# Patient Record
Sex: Male | Born: 1990 | Race: Black or African American | Hispanic: No | Marital: Single | Smoking: Current some day smoker
Health system: Southern US, Community
[De-identification: ages and names within clinical notes are randomized; demographics above are authoritative.]

## PROBLEM LIST (undated history)

## (undated) DIAGNOSIS — C7A8 Other malignant neuroendocrine tumors: Secondary | ICD-10-CM

## (undated) DIAGNOSIS — J45909 Unspecified asthma, uncomplicated: Secondary | ICD-10-CM

## (undated) HISTORY — DX: Unspecified asthma, uncomplicated: J45.909

---

## 2012-05-13 DIAGNOSIS — J45909 Unspecified asthma, uncomplicated: Secondary | ICD-10-CM | POA: Insufficient documentation

## 2017-02-17 ENCOUNTER — Ambulatory Visit (HOSPITAL_COMMUNITY)
Admission: RE | Admit: 2017-02-17 | Discharge: 2017-02-17 | Disposition: A | Discharge status: Home / Self Care | Payer: 59 | Attending: Psychiatry | Admitting: Psychiatry

## 2017-02-17 DIAGNOSIS — Z1330 Encounter for screening examination for mental health and behavioral disorders, unspecified: Secondary | ICD-10-CM | POA: Insufficient documentation

## 2017-02-17 DIAGNOSIS — F320 Major depressive disorder, single episode, mild: Secondary | ICD-10-CM
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
## 2017-02-17 NOTE — H&P (Addendum)
Behavioral Health Medical Screening Exam  Leon Fischer. is an 27 y.o. male who presents to [facility] accompanied with his mother, due to worsening depressive symptoms, but denies thoughts of self harm,SI/SA or HI. There is no hx of prior suicide attempts, Hx depression, use of psychotropics, alcoholism or recreational drug use.  Total Time spent with patient: 15 minutes  Psychiatric Specialty Exam: Physical Exam  Constitutional: He is oriented to person, place, and time. He appears well-developed and well-nourished. No distress.  HENT:  Head: Normocephalic.  Eyes: Pupils are equal, round, and reactive to light.  Respiratory: Effort normal and breath sounds normal. No respiratory distress.  Neurological: He is alert and oriented to person, place, and time. No cranial nerve deficit.  Skin: Skin is warm and dry. He is not diaphoretic.  Psychiatric: Judgment and thought content normal. His affect is labile. His speech is delayed. He is withdrawn. Cognition and memory are normal. He exhibits a depressed mood.    Review of Systems  Constitutional: Negative for chills, diaphoresis, fever, malaise/fatigue and weight loss.  Neurological: Negative for weakness.  Psychiatric/Behavioral: Positive for depression. Negative for hallucinations, substance abuse and suicidal ideas.  All other systems reviewed and are negative.   There were no vitals taken for this visit.There is no height or weight on file to calculate BMI.  General Appearance: Casual  Eye Contact:  Minimal  Speech:  Slow  Volume:  Decreased  Mood:  Depressed  Affect:  Congruent  Thought Process:  Goal Directed  Orientation:  Full (Time, Place, and Person)  Thought Content:  Logical  Suicidal Thoughts:  No  Homicidal Thoughts:  No  Memory:  Immediate;   Fair  Judgement:  Fair  Insight:  Fair  Psychomotor Activity:  Negative  Concentration: Concentration: Fair  Recall:  [employer] of Knowledge:Fair  Language: Fair   Akathisia:  Negative  Handed:  Right  AIMS (if indicated):     Assets:  Desire for Improvement  Sleep:       Musculoskeletal: Strength & Muscle Tone: within normal limits Gait & Station: normal Patient leans: N/A  There were no vitals taken for this visit.  Recommendations:  Based on my evaluation the patient does not appear to have an emergency medical condition.  Laverle Hobby, PA-C 02/17/2017, 10:04 PM

## 2017-04-08 ENCOUNTER — Ambulatory Visit (HOSPITAL_COMMUNITY): Payer: Self-pay | Admitting: Psychiatry

## 2017-04-14 ENCOUNTER — Encounter (HOSPITAL_COMMUNITY): Payer: Self-pay | Admitting: Psychiatry

## 2017-04-14 ENCOUNTER — Other Ambulatory Visit (HOSPITAL_COMMUNITY): Payer: Self-pay

## 2017-04-14 ENCOUNTER — Ambulatory Visit (INDEPENDENT_AMBULATORY_CARE_PROVIDER_SITE_OTHER): Payer: 59 | Admitting: Psychiatry

## 2017-04-14 VITALS — BP 132/80 | HR 94 | Ht 72.0 in | Wt 325.0 lb

## 2017-04-14 DIAGNOSIS — Z813 Family history of other psychoactive substance abuse and dependence: Secondary | ICD-10-CM | POA: Diagnosis not present

## 2017-04-14 DIAGNOSIS — Z79899 Other long term (current) drug therapy: Secondary | ICD-10-CM | POA: Diagnosis not present

## 2017-04-14 DIAGNOSIS — F17200 Nicotine dependence, unspecified, uncomplicated: Secondary | ICD-10-CM | POA: Diagnosis not present

## 2017-04-14 DIAGNOSIS — F331 Major depressive disorder, recurrent, moderate: Secondary | ICD-10-CM | POA: Diagnosis not present

## 2017-04-14 MED ORDER — ARIPIPRAZOLE 5 MG PO TABS: ORAL_TABLET | ORAL | 1 refills | Status: DC
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
## 2017-04-15 LAB — CBC WITH DIFFERENTIAL/PLATELET
BASOS: 0 %
Basophils Absolute: 0 10*3/uL (ref 0.0–0.2)
EOS (ABSOLUTE): 0.2 10*3/uL (ref 0.0–0.4)
EOS: 3 %
HEMATOCRIT: 42.5 % (ref 37.5–51.0)
Hemoglobin: 14.2 g/dL (ref 13.0–17.7)
IMMATURE GRANULOCYTES: 0 %
Immature Grans (Abs): 0 10*3/uL (ref 0.0–0.1)
Lymphocytes Absolute: 1.7 10*3/uL (ref 0.7–3.1)
Lymphs: 24 %
MCH: 29.5 pg (ref 26.6–33.0)
MCHC: 33.4 g/dL (ref 31.5–35.7)
MCV: 88 fL (ref 79–97)
MONOS ABS: 0.7 10*3/uL (ref 0.1–0.9)
Monocytes: 9 %
NEUTROS PCT: 64 %
Neutrophils Absolute: 4.8 10*3/uL (ref 1.4–7.0)
Platelets: 325 10*3/uL (ref 150–379)
RBC: 4.81 x10E6/uL (ref 4.14–5.80)
RDW: 13.7 % (ref 12.3–15.4)
WBC: 7.4 10*3/uL (ref 3.4–10.8)

## 2017-04-15 LAB — TSH: TSH: 2.11 u[IU]/mL (ref 0.450–4.500)

## 2017-04-15 LAB — HEMOGLOBIN A1C
Est. average glucose Bld gHb Est-mCnc: 114 mg/dL
Hgb A1c MFr Bld: 5.6 % (ref 4.8–5.6)

## 2017-04-15 LAB — COMPREHENSIVE METABOLIC PANEL
ALBUMIN: 4.6 g/dL (ref 3.5–5.5)
ALT: 16 IU/L (ref 0–44)
AST: 15 IU/L (ref 0–40)
Albumin/Globulin Ratio: 1.6 (ref 1.2–2.2)
Alkaline Phosphatase: 93 IU/L (ref 39–117)
BUN / CREAT RATIO: 12 (ref 9–20)
BUN: 12 mg/dL (ref 6–20)
Bilirubin Total: 0.3 mg/dL (ref 0.0–1.2)
CALCIUM: 9.3 mg/dL (ref 8.7–10.2)
CO2: 22 mmol/L (ref 20–29)
CREATININE: 0.99 mg/dL (ref 0.76–1.27)
Chloride: 104 mmol/L (ref 96–106)
GFR calc Af Amer: 121 mL/min/{1.73_m2} (ref >59)
GFR, EST NON AFRICAN AMERICAN: 105 mL/min/{1.73_m2} (ref >59)
GLOBULIN, TOTAL: 2.9 g/dL (ref 1.5–4.5)
Glucose: 95 mg/dL (ref 65–99)
Potassium: 4.8 mmol/L (ref 3.5–5.2)
SODIUM: 142 mmol/L (ref 134–144)
TOTAL PROTEIN: 7.5 g/dL (ref 6.0–8.5)

## 2017-05-12 ENCOUNTER — Ambulatory Visit (INDEPENDENT_AMBULATORY_CARE_PROVIDER_SITE_OTHER): Payer: 59 | Admitting: Licensed Clinical Social Worker

## 2017-05-12 ENCOUNTER — Encounter (HOSPITAL_COMMUNITY): Payer: Self-pay | Admitting: Licensed Clinical Social Worker

## 2017-05-12 DIAGNOSIS — F331 Major depressive disorder, recurrent, moderate: Secondary | ICD-10-CM | POA: Diagnosis not present
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
## 2017-05-16 ENCOUNTER — Encounter (HOSPITAL_COMMUNITY): Payer: Self-pay | Admitting: Psychiatry

## 2017-05-16 ENCOUNTER — Ambulatory Visit (INDEPENDENT_AMBULATORY_CARE_PROVIDER_SITE_OTHER): Payer: 59 | Admitting: Psychiatry

## 2017-05-16 VITALS — BP 136/80 | HR 87 | Ht 72.0 in | Wt 335.0 lb

## 2017-05-16 DIAGNOSIS — F331 Major depressive disorder, recurrent, moderate: Secondary | ICD-10-CM | POA: Diagnosis not present

## 2017-05-16 DIAGNOSIS — R51 Headache: Secondary | ICD-10-CM

## 2017-05-16 DIAGNOSIS — R110 Nausea: Secondary | ICD-10-CM

## 2017-05-16 DIAGNOSIS — F17210 Nicotine dependence, cigarettes, uncomplicated: Secondary | ICD-10-CM

## 2017-05-16 DIAGNOSIS — Z813 Family history of other psychoactive substance abuse and dependence: Secondary | ICD-10-CM

## 2017-05-16 NOTE — Progress Notes (Signed)
BH MD/PA/NP OP Progress Note  05/16/2017 10:14 AM [name].  MRN:  [id number]  Chief Complaint: I tried Abilify for 5 days but then stopped due to nausea and headaches.  I am not interested to try any new medication.  HPI: Leon Fischer is 27 year old single African-American employed man who was seen first time 4 weeks ago.  He was referred from behavioral health center.  He was seen as a walk-in at behavioral center due to agitation and he was upset on his sister who started dating patient's friend without his knowledge.  He was recommended intensive outpatient program but he never came for IOP.  We started him on Abilify for his anger but patient stopped the Abilify after taking for only 5 days.  He reported he has headaches and nausea and he could not tolerate.  Patient does not want to try any other medication.  He is seeing Emilie Rutter for therapy.  She denies any recent agitation or anger.  He is trying to walk away when he got upset.  He is sleeping better.  He denies any recent rage or any screaming, yelling and shouting.  He is keeping her distance from his daughter.  He does not trust people easily but he does not feel that he need any medication for that.  He denies any hallucination, suicidal thoughts or any aggressive behavior.  He drinks alcohol on and off but denies any binge or any intoxication.  He denies any illegal substance use.  Patient like to follow-up to see Emilie Rutter for counseling.  He denies any homicidal thoughts toward his sister and her boyfriend.  He told that he learned that he need to walk away without causing any problems.  Patient promised that if symptoms started to get worse then he will call for appointment for medication.  Patient never married and he has no children.  Currently he is not in any relationship.  Visit Diagnosis:    ICD-10-CM   1. MDD (major depressive disorder), recurrent episode, moderate ([place]) F33.1     Past Psychiatric History:  Reviewed. Patient had a history of depression and anxiety since age 71.  He denies any history of psychiatric inpatient treatment or any suicidal attempt.  He endorsed having trust issues and he was seen as a walk-in at behavioral health center in January 2019 and recommended IOP but he never came for group therapy.  We started him on Abilify but he stopped taking the medication after 5 days due to nausea and headaches.  Past Medical History:  Past Medical History:  Diagnosis Date  . Asthma    History reviewed. No pertinent surgical history.  Family Psychiatric History: Reviewed  Family History:  Family History  Problem Relation Age of Onset  . Drug abuse Father     Social History:  Social History   Socioeconomic History  . Marital status: Single    Spouse name: Not on file  . Number of children: 0  . Years of education: Not on file  . Highest education level: Some college, no degree  Occupational History  . Not on file  Social Needs  . Financial resource strain: Not hard at all  . Food insecurity:    Worry: Never true    Inability: Never true  . Transportation needs:    Medical: No    Non-medical: No  Tobacco Use  . Smoking status: Current Some Day Smoker  . Smokeless tobacco: Never Used  Substance and Sexual Activity  .  Alcohol use: Yes    Comment: social  . Drug use: No  . Sexual activity: Never  Lifestyle  . Physical activity:    Days per week: 0 days    Minutes per session: 0 min  . Stress: Rather much  Relationships  . Social connections:    Talks on phone: More than three times a week    Gets together: More than three times a week    Attends religious service: Never    Active member of club or organization: No    Attends meetings of clubs or organizations: Never    Relationship status: Never married  Other Topics Concern  . Not on file  Social History Narrative  . Not on file    Allergies:  Allergies  Allergen Reactions  . Shellfish Allergy  Swelling    Eyes, throat swell shut.     Metabolic Disorder Labs: Lab Results  Component Value Date   HGBA1C 5.6 04/14/2017   No results found for: PROLACTIN No results found for: CHOL, TRIG, HDL, CHOLHDL, VLDL, LDLCALC Lab Results  Component Value Date   TSH 2.110 04/14/2017    Therapeutic Level Labs: No results found for: LITHIUM No results found for: VALPROATE No components found for:  CBMZ  Current Medications: Current Outpatient Medications  Medication Sig Dispense Refill  . ARIPiprazole (ABILIFY) 5 MG tablet Take 1/2 tab daily for 1 week and than full tab daily (Patient not taking: Reported on 05/16/2017) 30 tablet 1   No current facility-administered medications for this visit.      Musculoskeletal: Strength & Muscle Tone: within normal limits Gait & Station: normal Patient leans: N/A  Psychiatric Specialty Exam: ROS  Blood pressure 136/80, pulse 87, height 6' (1.829 m), weight (!) 335 lb (152 kg).Body mass index is 45.43 kg/m.  General Appearance: Fairly Groomed  Eye Contact:  Fair  Speech:  Slow  Volume:  Normal  Mood:  Euthymic  Affect:  Congruent  Thought Process:  Goal Directed  Orientation:  Full (Time, Place, and Person)  Thought Content: Logical   Suicidal Thoughts:  No  Homicidal Thoughts:  No  Memory:  Immediate;   Fair Recent;   Fair Remote;   Good  Judgement:  Fair  Insight:  Fair  Psychomotor Activity:  Normal  Concentration:  Concentration: Fair and Attention Span: Fair  Recall:  [employer] of Knowledge: Good  Language: Good  Akathisia:  No  Handed:  Right  AIMS (if indicated): not done  Assets:  Communication Skills Desire for Improvement Housing Transportation  ADL's:  Intact  Cognition: WNL  Sleep:  Good   Screenings: GAD-7     Counselor from 05/12/2017 in [place]  Total GAD-7 Score  7    PHQ2-9     Counselor from 05/12/2017 in [place]   PHQ-2 Total Score  2  PHQ-9 Total Score  7       Assessment and Plan: Mood disorder NOS.  Rule out major depressive disorder, recurrent.  I will discontinue Abilify as patient stopped taking it.  He is not interested to take any medication.  However he agreed to continue therapy with [facility].  He also agreed that if symptoms started to get worse then he will call to schedule appointment and consider taking medication.  We discussed safety concern that any time having active suicidal thoughts or homicidal thought that he need to call 911 or go to local emergency room.  We will not need appointment to see a psychiatrist but he will follow-up with a therapist.     Kathlee Nations, MD 05/16/2017, 10:14 AM

## 2017-05-27 ENCOUNTER — Encounter (HOSPITAL_COMMUNITY): Payer: Self-pay | Admitting: Licensed Clinical Social Worker

## 2017-05-27 ENCOUNTER — Ambulatory Visit (INDEPENDENT_AMBULATORY_CARE_PROVIDER_SITE_OTHER): Payer: 59 | Admitting: Licensed Clinical Social Worker

## 2017-05-27 DIAGNOSIS — F331 Major depressive disorder, recurrent, moderate: Secondary | ICD-10-CM
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
## 2017-06-24 ENCOUNTER — Ambulatory Visit (HOSPITAL_COMMUNITY): Payer: 59 | Admitting: Licensed Clinical Social Worker

## 2017-09-05 ENCOUNTER — Encounter (HOSPITAL_COMMUNITY): Payer: Self-pay

## 2017-09-05 ENCOUNTER — Other Ambulatory Visit: Payer: Self-pay

## 2017-09-05 ENCOUNTER — Emergency Department (HOSPITAL_COMMUNITY)
Admission: EM | Admit: 2017-09-05 | Discharge: 2017-09-05 | Disposition: A | Discharge status: Home / Self Care | Payer: 59 | Attending: Emergency Medicine | Admitting: Emergency Medicine

## 2017-09-05 DIAGNOSIS — F17210 Nicotine dependence, cigarettes, uncomplicated: Secondary | ICD-10-CM | POA: Diagnosis not present

## 2017-09-05 DIAGNOSIS — J4541 Moderate persistent asthma with (acute) exacerbation: Secondary | ICD-10-CM | POA: Insufficient documentation

## 2017-09-05 DIAGNOSIS — R062 Wheezing: Secondary | ICD-10-CM | POA: Diagnosis present

## 2017-09-05 DIAGNOSIS — R0602 Shortness of breath: Secondary | ICD-10-CM | POA: Insufficient documentation

## 2017-09-05 MED ORDER — ALBUTEROL SULFATE (2.5 MG/3ML) 0.083% IN NEBU: 2.5000 mg | INHALATION_SOLUTION | Freq: Four times a day (QID) | RESPIRATORY_TRACT | 12 refills | Status: DC | PRN

## 2017-09-05 MED ORDER — IPRATROPIUM BROMIDE 0.02 % IN SOLN
0.5000 mg | Freq: Once | RESPIRATORY_TRACT | Status: AC
  Administered 2017-09-05: 0.5 mg via RESPIRATORY_TRACT
  Filled 2017-09-05: qty 2.5

## 2017-09-05 MED ORDER — ALBUTEROL SULFATE HFA 108 (90 BASE) MCG/ACT IN AERS: 2.0000 | INHALATION_SPRAY | RESPIRATORY_TRACT | 3 refills | Status: DC | PRN

## 2017-09-05 MED ORDER — ALBUTEROL SULFATE (2.5 MG/3ML) 0.083% IN NEBU
5.0000 mg | INHALATION_SOLUTION | Freq: Once | RESPIRATORY_TRACT | Status: AC
  Administered 2017-09-05: 5 mg via RESPIRATORY_TRACT
  Filled 2017-09-05: qty 6

## 2017-09-05 MED ORDER — PREDNISONE 20 MG PO TABS: 60.0000 mg | ORAL_TABLET | Freq: Every day | ORAL | 0 refills | Status: DC

## 2017-09-05 MED ORDER — ALBUTEROL SULFATE (2.5 MG/3ML) 0.083% IN NEBU
5.0000 mg | INHALATION_SOLUTION | Freq: Once | RESPIRATORY_TRACT | Status: AC
Start: 2017-09-05 — End: 2017-09-05
  Administered 2017-09-05: 5 mg via RESPIRATORY_TRACT
  Filled 2017-09-05: qty 6

## 2017-09-05 NOTE — ED Notes (Signed)
Pt reports relief with neb treatment

## 2017-09-05 NOTE — ED Triage Notes (Signed)
Pt BIBA from home. Ambulatory with asthma attack. Pt has been battling with asthma for 2 weeks- lung suonds were wheezy upon EMS arrival. 10 albuterol and 0.5 atrovent received en route 125 solumedrol. Pt lung sounds are now clear. 148/82 110HR 20RR 97%

## 2017-09-05 NOTE — ED Notes (Signed)
Bed: TK16 Expected date:  Expected time:  Means of arrival:  Comments: 27 yo asthma exacerbation

## 2017-09-05 NOTE — ED Provider Notes (Signed)
[facility] DEPT Provider Note   CSN: [id number] Arrival date & time: 09/05/17  0743     History   Chief Complaint Chief Complaint  Patient presents with  . Asthma    HPI Leon Fischer. is a 27 y.o. male.  Patient w hx asthma, c/o increased wheezing in the past few days. Out of inhaler at home. Symptoms moderate, persistent, felt worse today. Denies increased cough, sore throat, or uri symptoms. No fever or chills. No chest pain or discomfort. Mild sob currently - improved post neb and solumedrol by EMS. Denies leg pain or swelling. +smoker.   The history is provided by the patient and the EMS personnel.  Asthma  Associated symptoms include shortness of breath. Pertinent negatives include no chest pain, no abdominal pain and no headaches.    Past Medical History:  Diagnosis Date  . Asthma     There are no active problems to display for this patient.   History reviewed. No pertinent surgical history.      Home Medications    Prior to Admission medications   Not on File    Family History Family History  Problem Relation Age of Onset  . Drug abuse Father     Social History Social History   Tobacco Use  . Smoking status: Current Some Day Smoker  . Smokeless tobacco: Never Used  Substance Use Topics  . Alcohol use: Yes    Comment: social  . Drug use: No     Allergies   Shellfish allergy   Review of Systems Review of Systems  Constitutional: Negative for fever.  HENT: Negative for sore throat.   Eyes: Negative for redness.  Respiratory: Positive for shortness of breath and wheezing.   Cardiovascular: Negative for chest pain and leg swelling.  Gastrointestinal: Negative for abdominal pain and vomiting.  Genitourinary: Negative for flank pain.  Musculoskeletal: Negative for back pain and neck pain.  Skin: Negative for rash.  Neurological: Negative for headaches.  Hematological: Does not bruise/bleed  easily.  Psychiatric/Behavioral: Negative for confusion.     Physical Exam Updated Vital Signs BP 138/87   Pulse (!) 108   Temp 98.8 F (37.1 C) (Oral)   Resp 18   Ht 1.829 m (6')   Wt (!) 152 kg (335 lb)   SpO2 94%   BMI 45.43 kg/m   Physical Exam  Constitutional: He appears well-developed and well-nourished.  HENT:  Mouth/Throat: Oropharynx is clear and moist.  Eyes: Conjunctivae are normal.  Neck: Neck supple. No tracheal deviation present.  Cardiovascular: Normal rate, regular rhythm, normal heart sounds and intact distal pulses. Exam reveals no gallop and no friction rub.  No murmur heard. Pulmonary/Chest: Effort normal. No accessory muscle usage. No respiratory distress. He has wheezes.  Abdominal: Soft. He exhibits no distension. There is no tenderness.  Musculoskeletal: He exhibits no edema or tenderness.  Neurological: He is alert.  Skin: Skin is warm and dry. No rash noted.  Psychiatric: He has a normal mood and affect.  Nursing note and vitals reviewed.    ED Treatments / Results  Labs (all labs ordered are listed, but only abnormal results are displayed) Labs Reviewed - No data to display  EKG None  Radiology No results found.  Procedures Procedures (including critical care time)  Medications Ordered in ED Medications - No data to display   Initial Impression / Assessment and Plan / ED Course  I have reviewed the triage vital signs and the  nursing notes.  Pertinent labs & imaging results that were available during my care of the patient were reviewed by me and considered in my medical decision making (see chart for details).  EMS gave albuterol and atrovent neb. Solumedrol.   Persistent wheezing. Albuterol and atrovent neb.  Reviewed nursing notes and prior charts for additional history.   Additional neb tx.   Recheck good air exchange. No increased wob.  Pt appears stable for d/c.   Requests rx for neb soln and mdi.     Final  Clinical Impressions(s) / ED Diagnoses   Final diagnoses:  None    ED Discharge Orders    None       Lajean Saver, MD 09/05/17 1103

## 2017-09-05 NOTE — Discharge Instructions (Addendum)
It was our pleasure to provide your ER care today - we hope that you feel better.  Use albuterol treatment every 4 hours as need. Avoid any smoking.  Take prednisone as prescribed.  Return to ER if worse, increased trouble breathing, other concern.

## 2017-11-16 ENCOUNTER — Emergency Department (HOSPITAL_BASED_OUTPATIENT_CLINIC_OR_DEPARTMENT_OTHER)
Admission: EM | Admit: 2017-11-16 | Discharge: 2017-11-16 | Disposition: A | Discharge status: Home / Self Care | Payer: 59 | Attending: Emergency Medicine | Admitting: Emergency Medicine

## 2017-11-16 ENCOUNTER — Other Ambulatory Visit: Payer: Self-pay

## 2017-11-16 DIAGNOSIS — Z79899 Other long term (current) drug therapy: Secondary | ICD-10-CM | POA: Diagnosis not present

## 2017-11-16 DIAGNOSIS — Z720 Tobacco use: Secondary | ICD-10-CM | POA: Insufficient documentation

## 2017-11-16 DIAGNOSIS — J4541 Moderate persistent asthma with (acute) exacerbation: Secondary | ICD-10-CM | POA: Insufficient documentation

## 2017-11-16 MED ORDER — ALBUTEROL SULFATE HFA 108 (90 BASE) MCG/ACT IN AERS
INHALATION_SPRAY | RESPIRATORY_TRACT | Status: AC
  Filled 2017-11-16: qty 6.7

## 2017-11-16 MED ORDER — ALBUTEROL SULFATE (2.5 MG/3ML) 0.083% IN NEBU
5.0000 mg | INHALATION_SOLUTION | Freq: Once | RESPIRATORY_TRACT | Status: AC
  Administered 2017-11-16: 5 mg via RESPIRATORY_TRACT

## 2017-11-16 MED ORDER — ALBUTEROL SULFATE (2.5 MG/3ML) 0.083% IN NEBU: 2.5000 mg | INHALATION_SOLUTION | Freq: Once | RESPIRATORY_TRACT | Status: DC

## 2017-11-16 MED ORDER — ALBUTEROL SULFATE (2.5 MG/3ML) 0.083% IN NEBU
INHALATION_SOLUTION | RESPIRATORY_TRACT | Status: AC
  Filled 2017-11-16: qty 6

## 2017-11-16 MED ORDER — PREDNISONE 20 MG PO TABS: ORAL_TABLET | ORAL | 0 refills | Status: DC

## 2017-11-16 MED ORDER — IPRATROPIUM-ALBUTEROL 0.5-2.5 (3) MG/3ML IN SOLN
3.0000 mL | Freq: Four times a day (QID) | RESPIRATORY_TRACT | Status: DC
  Administered 2017-11-16: 3 mL via RESPIRATORY_TRACT

## 2017-11-16 MED ORDER — ALBUTEROL SULFATE HFA 108 (90 BASE) MCG/ACT IN AERS
2.0000 | INHALATION_SPRAY | RESPIRATORY_TRACT | Status: DC | PRN
  Administered 2017-11-16: 2 via RESPIRATORY_TRACT

## 2017-11-16 MED ORDER — ALBUTEROL SULFATE (2.5 MG/3ML) 0.083% IN NEBU
INHALATION_SOLUTION | RESPIRATORY_TRACT | Status: AC
  Administered 2017-11-16: 22:00:00
  Filled 2017-11-16: qty 3

## 2017-11-16 MED ORDER — METHYLPREDNISOLONE SODIUM SUCC 125 MG IJ SOLR
INTRAMUSCULAR | Status: AC
  Filled 2017-11-16: qty 2

## 2017-11-16 MED ORDER — IPRATROPIUM-ALBUTEROL 0.5-2.5 (3) MG/3ML IN SOLN
RESPIRATORY_TRACT | Status: AC
  Filled 2017-11-16: qty 3

## 2017-11-16 MED ORDER — METHYLPREDNISOLONE SODIUM SUCC 125 MG IJ SOLR
125.0000 mg | Freq: Once | INTRAMUSCULAR | Status: AC
  Administered 2017-11-16: 125 mg via INTRAVENOUS

## 2017-11-16 NOTE — ED Provider Notes (Signed)
[facility] Provider Note   CSN: [id number] Arrival date & time: 11/16/17  2136     History   Chief Complaint Chief Complaint  Patient presents with  . Asthma Exacerbation.    HPI Leon Fischer. is a 27 y.o. male.  HPI Patient complained of wheezing and shortness of breath typical of asthma onset gradually yesterday.  Became much worse this evening.  He treated himself with his albuterol inhaler, with minimal relief.  He has since run out of his inhaler.  Nothing makes symptoms better or worse.  No fever.  He admits to minimal cough.  No other associated symptoms Past Medical History:  Diagnosis Date  . Asthma   No hospitalizations within the past year.  No history of intubation.  There are no active problems to display for this patient.   No past surgical history on file.      Home Medications    Prior to Admission medications   Medication Sig Start Date End Date Taking? Authorizing Provider  albuterol (PROVENTIL HFA;VENTOLIN HFA) 108 (90 Base) MCG/ACT inhaler Inhale 2 puffs into the lungs every 4 (four) hours as needed for wheezing or shortness of breath. 09/05/17   Lajean Saver, MD  albuterol (PROVENTIL) (2.5 MG/3ML) 0.083% nebulizer solution Take 3 mLs (2.5 mg total) by nebulization every 6 (six) hours as needed for wheezing or shortness of breath. 09/05/17   Lajean Saver, MD  predniSONE (DELTASONE) 20 MG tablet 3 tabs po daily x 5 days 11/16/17   Orlie Dakin, MD    Family History Family History  Problem Relation Age of Onset  . Drug abuse Father     Social History Social History   Tobacco Use  . Smoking status: Current Some Day Smoker  . Smokeless tobacco: Never Used  Substance Use Topics  . Alcohol use: Yes    Comment: social  . Drug use: No     Allergies   Shellfish allergy   Review of Systems Review of Systems  Constitutional: Negative.   HENT: Negative.   Respiratory: Positive for cough,  shortness of breath and wheezing.   Cardiovascular: Negative.   Gastrointestinal: Negative.   Musculoskeletal: Negative.   Skin: Negative.   Neurological: Negative.   Psychiatric/Behavioral: Negative.   All other systems reviewed and are negative.    Physical Exam Updated Vital Signs BP (!) 151/86 (BP Location: Right Arm)   Pulse (!) 110   Temp 98.6 F (37 C) (Oral)   Resp (!) 22   SpO2 94%   Physical Exam  Constitutional: He appears well-developed and well-nourished. He appears distressed.  Severely dyspneic  HENT:  Head: Normocephalic and atraumatic.  Eyes: Pupils are equal, round, and reactive to light. Conjunctivae are normal.  Neck: Neck supple. No tracheal deviation present. No thyromegaly present.  Cardiovascular: Regular rhythm and intact distal pulses.  No murmur heard. Mildly tachycardic  Pulmonary/Chest: Breath sounds normal. He is in respiratory distress. He has no wheezes.  Prolonged expiratory phase with expiratory wheezes.  Severe respiratory distress. speaks in 2 or 3 word phrases with use of accessory muscles  Abdominal: Soft. Bowel sounds are normal. He exhibits no distension. There is no tenderness.  Musculoskeletal: Normal range of motion. He exhibits no edema or tenderness.  Neurological: He is alert. Coordination normal.  Skin: Skin is warm. No rash noted.  Diaphoretic  Psychiatric: He has a normal mood and affect.  Nursing note and vitals reviewed.    ED Treatments / Results  Labs (all labs ordered are listed, but only abnormal results are displayed) Labs Reviewed - No data to display  EKG None  Radiology No results found.  Procedures Procedures (including critical care time)  Medications Ordered in ED Medications  methylPREDNISolone sodium succinate (SOLU-MEDROL) 125 mg/2 mL injection (has no administration in time range)  albuterol (PROVENTIL) (2.5 MG/3ML) 0.083% nebulizer solution 2.5 mg (has no administration in time range)    ipratropium-albuterol (DUONEB) 0.5-2.5 (3) MG/3ML nebulizer solution 3 mL (3 mLs Nebulization Given 11/16/17 2140)  albuterol (PROVENTIL) (2.5 MG/3ML) 0.083% nebulizer solution (has no administration in time range)  albuterol (PROVENTIL HFA;VENTOLIN HFA) 108 (90 Base) MCG/ACT inhaler 2 puff (2 puffs Inhalation Given 11/16/17 2333)  albuterol (PROVENTIL HFA;VENTOLIN HFA) 108 (90 Base) MCG/ACT inhaler (has no administration in time range)  albuterol (PROVENTIL) (2.5 MG/3ML) 0.083% nebulizer solution (  Given 11/16/17 2140)  methylPREDNISolone sodium succinate (SOLU-MEDROL) 125 mg/2 mL injection 125 mg (125 mg Intravenous Given 11/16/17 2152)  albuterol (PROVENTIL) (2.5 MG/3ML) 0.083% nebulizer solution 5 mg (5 mg Nebulization Given 11/16/17 2200)     Initial Impression / Assessment and Plan / ED Course  I have reviewed the triage vital signs and the nursing notes.  Pertinent labs & imaging results that were available during my care of the patient were reviewed by me and considered in my medical decision making (see chart for details).     11:25 PM patient's breathing is normal after treatment with 2 albuterol nebulized treatments and IV Solu-Medrol.  He speaks in paragraphs.  Lungs are clear.  Skin is warm and dry.  He is able to ambulate in the emergency department without dyspnea with normal pulse ox.   Plan he will get an albuterol inhaler to go to use 2 puffs every 4 hours as needed for shortness of breath.  Instructed to return if needed more than every 4 hours.  He has a spacer at home.  Prescription prednisone referral primary care. I councilled pt for 5 minutes on smoking cessation,. Blood pressure recheck 3 weeks Final Clinical Impressions(s) / ED Diagnoses  Diagnosis #1 exacerbation of asthma Final diagnoses:  Moderate persistent asthma with exacerbation  #2 elevated blood pressure #3 tobacco abuse CRITICAL CARE Performed by: Orlie Dakin Total critical care time: 30  minutes Critical care time was exclusive of separately billable procedures and treating other patients. Critical care was necessary to treat or prevent imminent or life-threatening deterioration. Critical care was time spent personally by me on the following activities: development of treatment plan with patient and/or surrogate as well as nursing, discussions with consultants, evaluation of patient's response to treatment, examination of patient, obtaining history from patient or surrogate, ordering and performing treatments and interventions, ordering and review of laboratory studies, ordering and review of radiographic studies, pulse oximetry and re-evaluation of patient's condition.  ED Discharge Orders         Ordered    predniSONE (DELTASONE) 20 MG tablet     11/16/17 2334           Orlie Dakin, MD 11/16/17 2342

## 2017-11-16 NOTE — ED Triage Notes (Addendum)
Pt exhibited gross diaphoresis, audible wheezing, short speech, and c/o SOB. Pt reports hx of Asthma. Pt states his rescue inhaler is "out".

## 2017-11-16 NOTE — ED Notes (Signed)
Albuterol MDI use reviewed with patient, patient with good understanding of use, with no questions.  No med administered, patient feels he doesn't need at this time.

## 2017-11-16 NOTE — Discharge Instructions (Addendum)
Use your albuterol inhaler 2 puffs every 4 hours as needed for shortness of breath.  Return if needed more than every 4 hours.  Call the number on these instructions to get a primary care physician.  Ask your primary care physician to help you to stop smoking..  Take the prednisone prescribed starting tomorrow morning and your blood pressure should be rechecked within the next 3 weeks.  Today's was mildly elevated at 151/86

## 2017-11-16 NOTE — ED Notes (Signed)
Patient ambulated on room air approx. 550 ft.  HR [phone], SpO2 95-96% throughout.  No c/o SOB from patient.

## 2018-01-20 NOTE — Addendum Note (Signed)
Encounter addended by: Steffanie Rainwater on: 01/20/2018 7:50 PM  Actions taken: Clinical Note Signed

## 2018-12-20 ENCOUNTER — Encounter (HOSPITAL_BASED_OUTPATIENT_CLINIC_OR_DEPARTMENT_OTHER): Payer: Self-pay | Admitting: Emergency Medicine

## 2018-12-20 ENCOUNTER — Other Ambulatory Visit: Payer: Self-pay

## 2018-12-20 ENCOUNTER — Emergency Department (HOSPITAL_BASED_OUTPATIENT_CLINIC_OR_DEPARTMENT_OTHER)
Admission: EM | Admit: 2018-12-20 | Discharge: 2018-12-20 | Disposition: A | Discharge status: Home / Self Care | Payer: 59 | Attending: Emergency Medicine | Admitting: Emergency Medicine

## 2018-12-20 DIAGNOSIS — F17200 Nicotine dependence, unspecified, uncomplicated: Secondary | ICD-10-CM | POA: Insufficient documentation

## 2018-12-20 DIAGNOSIS — J4521 Mild intermittent asthma with (acute) exacerbation: Secondary | ICD-10-CM | POA: Insufficient documentation

## 2018-12-20 MED ORDER — ALBUTEROL SULFATE HFA 108 (90 BASE) MCG/ACT IN AERS
INHALATION_SPRAY | RESPIRATORY_TRACT | Status: AC
  Filled 2018-12-20: qty 6.7

## 2018-12-20 MED ORDER — ALBUTEROL SULFATE HFA 108 (90 BASE) MCG/ACT IN AERS
8.0000 | INHALATION_SPRAY | Freq: Once | RESPIRATORY_TRACT | Status: AC
  Administered 2018-12-20: 11:00:00 8 via RESPIRATORY_TRACT

## 2018-12-20 MED ORDER — PREDNISONE 10 MG PO TABS: 20.0000 mg | ORAL_TABLET | Freq: Every day | ORAL | 0 refills | Status: AC

## 2018-12-20 MED ORDER — ALBUTEROL SULFATE (2.5 MG/3ML) 0.083% IN NEBU: 2.5000 mg | INHALATION_SOLUTION | Freq: Four times a day (QID) | RESPIRATORY_TRACT | 1 refills | Status: DC | PRN

## 2018-12-20 MED ORDER — METHYLPREDNISOLONE SODIUM SUCC 125 MG IJ SOLR
125.0000 mg | Freq: Once | INTRAMUSCULAR | Status: AC
  Administered 2018-12-20: 125 mg via INTRAVENOUS
  Filled 2018-12-20: qty 2

## 2018-12-20 MED ORDER — ALBUTEROL SULFATE HFA 108 (90 BASE) MCG/ACT IN AERS: 2.0000 | INHALATION_SPRAY | RESPIRATORY_TRACT | 0 refills | Status: DC | PRN

## 2018-12-20 MED ORDER — IPRATROPIUM BROMIDE HFA 17 MCG/ACT IN AERS
2.0000 | INHALATION_SPRAY | Freq: Once | RESPIRATORY_TRACT | Status: AC
  Administered 2018-12-20: 4 via RESPIRATORY_TRACT
  Filled 2018-12-20: qty 12.9

## 2018-12-20 NOTE — ED Notes (Signed)
Pt verbalized understanding of dc instructions.

## 2018-12-20 NOTE — ED Triage Notes (Signed)
Pt here with acute asthma exacerbation since this morning. Ran out of neb solution and rescue inhaler today. O2 sats 87% on RA with audible wheezing heard without auscultation.

## 2018-12-20 NOTE — Discharge Instructions (Signed)
Please read instructions below. Use your albuterol inhaler every 4-6 hours as needed for shortness of breath or wheezing.  Starting tomorrow, begin taking the prednisone, as prescribed, until it is gone. Follow up with your primary care provider. Return to the ER if you have shortness of breath not improved by your inhaler, or new or concerning symptoms.  

## 2018-12-20 NOTE — ED Provider Notes (Signed)
[facility] Provider Note   CSN: [id number] Arrival date & time: 12/20/18  1059     History   Chief Complaint Chief Complaint  Patient presents with  . Asthma    HPI Leon Rizk. is a 28 y.o. male past medical history of asthma, presenting to the emergency department with Hartness of breath and wheezing that began this morning around 10 AM.  Patient states he has been out of his albuterol inhaler and albuterol nebulizer solution for about 1-1/2 months now.  He states his asthma is usually well controlled, not requiring medications very frequently.  He states his symptoms today feel very typical of an asthma exacerbation.  He denies fever or URI symptoms. His SOB feels some improved after albuterol given in triage prior to evaluation.     The history is provided by the patient.    Past Medical History:  Diagnosis Date  . Asthma     There are no active problems to display for this patient.   History reviewed. No pertinent surgical history.      Home Medications    Prior to Admission medications   Medication Sig Start Date End Date Taking? Authorizing Provider  albuterol (PROVENTIL) (2.5 MG/3ML) 0.083% nebulizer solution Take 3 mLs (2.5 mg total) by nebulization every 6 (six) hours as needed for wheezing or shortness of breath. 12/20/18   Robinson, [place] N, PA-C  albuterol (VENTOLIN HFA) 108 (90 Base) MCG/ACT inhaler Inhale 2 puffs into the lungs every 4 (four) hours as needed for wheezing or shortness of breath. 12/20/18   Robinson, [place] N, PA-C  predniSONE (DELTASONE) 10 MG tablet Take 2 tablets (20 mg total) by mouth daily for 5 days. 12/20/18 12/25/18  Robinson, [place] N, PA-C    Family History Family History  Problem Relation Age of Onset  . Drug abuse Father     Social History Social History   Tobacco Use  . Smoking status: Current Some Day Smoker  . Smokeless tobacco: Never Used  Substance Use Topics  .  Alcohol use: Yes    Comment: social  . Drug use: No     Allergies   Shellfish allergy   Review of Systems Review of Systems  All other systems reviewed and are negative.    Physical Exam Updated Vital Signs BP 135/69   Pulse 94   Temp 98.9 F (37.2 C) (Oral)   Resp 18   SpO2 99%   Physical Exam Vitals signs and nursing note reviewed.  Constitutional:      General: He is not in acute distress.    Appearance: He is well-developed. He is obese.  HENT:     Head: Normocephalic and atraumatic.  Eyes:     Conjunctiva/sclera: Conjunctivae normal.  Cardiovascular:     Rate and Rhythm: Normal rate and regular rhythm.  Pulmonary:     Effort: Pulmonary effort is normal.     Breath sounds: Wheezing present.     Comments: Diffuse wheezes with dec air movement. O2 sat 95% on room after. No distress noted. Abdominal:     Palpations: Abdomen is soft.  Skin:    General: Skin is warm.  Neurological:     Mental Status: He is alert.  Psychiatric:        Behavior: Behavior normal.      ED Treatments / Results  Labs (all labs ordered are listed, but only abnormal results are displayed) Labs Reviewed - No data to display  EKG  None  Radiology No results found.  Procedures Procedures (including critical care time)  Medications Ordered in ED Medications  ipratropium (ATROVENT HFA) inhaler 2 puff (4 puffs Inhalation Given 12/20/18 1117)  methylPREDNISolone sodium succinate (SOLU-MEDROL) 125 mg/2 mL injection 125 mg (125 mg Intravenous Given 12/20/18 1120)  albuterol (VENTOLIN HFA) 108 (90 Base) MCG/ACT inhaler 8 puff (8 puffs Inhalation Given 12/20/18 1118)     Initial Impression / Assessment and Plan / ED Course  I have reviewed the triage vital signs and the nursing notes.  Pertinent labs & imaging results that were available during my care of the patient were reviewed by me and considered in my medical decision making (see chart for details).  Clinical Course as  of Dec 19 1312  Sun Dec 20, 2018  1116 Pt evaluated, just administered 8 puffs albuterol by respiratory therapist. Pt has some mild retractions. Lungs with diffuse wheezes. Will administer atrovent and solumedrol   [JR]    Clinical Course User Index [JR] Robinson, [place] N, PA-C       Patient presenting with symptoms consistent with asthma exacerbation. On initial exam, pt with diffuse wheezes and normal respiratory effort. O2 saturation is 95-100% on RA after breathing treatment. Inhaler treatments (given covid precautions) and steroid administered. On re-evaluation lung exam improved and pt reports they are breathing at their baseline. No signs of respiratory distress. Patient will be discharged with prednisone. Pt has been instructed to continue using prescribed medications and to speak with PCP about today's exacerbation. Return precautions discussed. Safe for discharge.   Discussed results, findings, treatment and follow up. Patient advised of return precautions. Patient verbalized understanding and agreed with plan.  Final Clinical Impressions(s) / ED Diagnoses   Final diagnoses:  Mild intermittent asthma with exacerbation    ED Discharge Orders         Ordered    predniSONE (DELTASONE) 10 MG tablet  Daily     12/20/18 1311    albuterol (PROVENTIL) (2.5 MG/3ML) 0.083% nebulizer solution  Every 6 hours PRN     12/20/18 1311    albuterol (VENTOLIN HFA) 108 (90 Base) MCG/ACT inhaler  Every 4 hours PRN     12/20/18 1311           Robinson, [place] N, [place] 12/20/18 1314    Noemi Chapel, MD 12/21/18 [phone]

## 2019-01-13 ENCOUNTER — Other Ambulatory Visit: Payer: Self-pay

## 2019-01-13 DIAGNOSIS — J45909 Unspecified asthma, uncomplicated: Secondary | ICD-10-CM | POA: Diagnosis not present

## 2019-01-13 DIAGNOSIS — Y92410 Unspecified street and highway as the place of occurrence of the external cause: Secondary | ICD-10-CM | POA: Diagnosis not present

## 2019-01-13 DIAGNOSIS — F17200 Nicotine dependence, unspecified, uncomplicated: Secondary | ICD-10-CM | POA: Insufficient documentation

## 2019-01-13 DIAGNOSIS — S0990XA Unspecified injury of head, initial encounter: Secondary | ICD-10-CM | POA: Insufficient documentation

## 2019-01-13 DIAGNOSIS — Y999 Unspecified external cause status: Secondary | ICD-10-CM | POA: Insufficient documentation

## 2019-01-13 DIAGNOSIS — Y9389 Activity, other specified: Secondary | ICD-10-CM | POA: Diagnosis not present

## 2019-01-13 DIAGNOSIS — Z79899 Other long term (current) drug therapy: Secondary | ICD-10-CM | POA: Diagnosis not present

## 2019-01-14 ENCOUNTER — Encounter (HOSPITAL_COMMUNITY): Payer: Self-pay | Admitting: Emergency Medicine

## 2019-01-14 ENCOUNTER — Other Ambulatory Visit: Payer: Self-pay

## 2019-01-14 ENCOUNTER — Emergency Department (HOSPITAL_COMMUNITY): Payer: No Typology Code available for payment source

## 2019-01-14 ENCOUNTER — Emergency Department (HOSPITAL_COMMUNITY)
Admission: EM | Admit: 2019-01-14 | Discharge: 2019-01-14 | Disposition: A | Discharge status: Home / Self Care | Payer: No Typology Code available for payment source | Attending: Emergency Medicine | Admitting: Emergency Medicine

## 2019-01-14 DIAGNOSIS — S0990XA Unspecified injury of head, initial encounter: Secondary | ICD-10-CM
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
## 2019-01-14 MED ORDER — ACETAMINOPHEN 500 MG PO TABS
1000.0000 mg | ORAL_TABLET | Freq: Once | ORAL | Status: AC
  Administered 2019-01-14: 1000 mg via ORAL
  Filled 2019-01-14: qty 2
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
## 2019-01-14 NOTE — ED Notes (Signed)
Patient was verbalized discharge instructions. Pt had no further questions at this time. NAD. 
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
## 2021-07-02 ENCOUNTER — Other Ambulatory Visit: Payer: Self-pay

## 2021-07-02 ENCOUNTER — Inpatient Hospital Stay (HOSPITAL_COMMUNITY): Payer: 59

## 2021-07-02 ENCOUNTER — Inpatient Hospital Stay (HOSPITAL_BASED_OUTPATIENT_CLINIC_OR_DEPARTMENT_OTHER)
Admission: EM | Admit: 2021-07-02 | Discharge: 2021-07-05 | DRG: 375 | Disposition: A | Discharge status: Home / Self Care | Payer: 59 | Attending: Internal Medicine | Admitting: Internal Medicine

## 2021-07-02 ENCOUNTER — Encounter (HOSPITAL_BASED_OUTPATIENT_CLINIC_OR_DEPARTMENT_OTHER): Payer: Self-pay | Admitting: *Deleted

## 2021-07-02 ENCOUNTER — Emergency Department (HOSPITAL_BASED_OUTPATIENT_CLINIC_OR_DEPARTMENT_OTHER): Payer: 59

## 2021-07-02 DIAGNOSIS — Z79899 Other long term (current) drug therapy: Secondary | ICD-10-CM | POA: Diagnosis not present

## 2021-07-02 DIAGNOSIS — F17200 Nicotine dependence, unspecified, uncomplicated: Secondary | ICD-10-CM | POA: Diagnosis present

## 2021-07-02 DIAGNOSIS — K859 Acute pancreatitis without necrosis or infection, unspecified: Secondary | ICD-10-CM

## 2021-07-02 DIAGNOSIS — K5900 Constipation, unspecified: Secondary | ICD-10-CM | POA: Diagnosis present

## 2021-07-02 DIAGNOSIS — K8689 Other specified diseases of pancreas: Secondary | ICD-10-CM | POA: Diagnosis present

## 2021-07-02 DIAGNOSIS — F1090 Alcohol use, unspecified, uncomplicated: Secondary | ICD-10-CM | POA: Diagnosis present

## 2021-07-02 DIAGNOSIS — Z813 Family history of other psychoactive substance abuse and dependence: Secondary | ICD-10-CM | POA: Diagnosis not present

## 2021-07-02 DIAGNOSIS — K769 Liver disease, unspecified: Secondary | ICD-10-CM

## 2021-07-02 DIAGNOSIS — D490 Neoplasm of unspecified behavior of digestive system: Principal | ICD-10-CM | POA: Diagnosis present

## 2021-07-02 DIAGNOSIS — K838 Other specified diseases of biliary tract: Secondary | ICD-10-CM | POA: Diagnosis present

## 2021-07-02 DIAGNOSIS — R590 Localized enlarged lymph nodes: Secondary | ICD-10-CM | POA: Diagnosis present

## 2021-07-02 DIAGNOSIS — C787 Secondary malignant neoplasm of liver and intrahepatic bile duct: Secondary | ICD-10-CM | POA: Diagnosis present

## 2021-07-02 DIAGNOSIS — R109 Unspecified abdominal pain: Secondary | ICD-10-CM | POA: Diagnosis present

## 2021-07-02 DIAGNOSIS — Z91013 Allergy to seafood: Secondary | ICD-10-CM | POA: Diagnosis not present

## 2021-07-02 DIAGNOSIS — R7401 Elevation of levels of liver transaminase levels: Secondary | ICD-10-CM

## 2021-07-02 DIAGNOSIS — R748 Abnormal levels of other serum enzymes: Secondary | ICD-10-CM | POA: Diagnosis present

## 2021-07-02 DIAGNOSIS — Z6841 Body Mass Index (BMI) 40.0 and over, adult: Secondary | ICD-10-CM | POA: Diagnosis not present

## 2021-07-02 DIAGNOSIS — J45909 Unspecified asthma, uncomplicated: Secondary | ICD-10-CM | POA: Diagnosis present

## 2021-07-02 DIAGNOSIS — E876 Hypokalemia: Secondary | ICD-10-CM | POA: Diagnosis present

## 2021-07-02 LAB — COMPREHENSIVE METABOLIC PANEL
ALT: 1293 U/L — ABNORMAL HIGH (ref 0–44)
AST: 577 U/L — ABNORMAL HIGH (ref 15–41)
Albumin: 4.1 g/dL (ref 3.5–5.0)
Alkaline Phosphatase: 252 U/L — ABNORMAL HIGH (ref 38–126)
Anion gap: 10 (ref 5–15)
BUN: 6 mg/dL (ref 6–20)
CO2: 26 mmol/L (ref 22–32)
Calcium: 9.1 mg/dL (ref 8.9–10.3)
Chloride: 102 mmol/L (ref 98–111)
Creatinine, Ser: 0.93 mg/dL (ref 0.61–1.24)
GFR, Estimated: >60 mL/min (ref >60)
Glucose, Bld: 123 mg/dL — ABNORMAL HIGH (ref 70–99)
Potassium: 3.4 mmol/L — ABNORMAL LOW (ref 3.5–5.1)
Sodium: 138 mmol/L (ref 135–145)
Total Bilirubin: 2.2 mg/dL — ABNORMAL HIGH (ref 0.3–1.2)
Total Protein: 7.6 g/dL (ref 6.5–8.1)

## 2021-07-02 LAB — CBC WITH DIFFERENTIAL/PLATELET
Abs Immature Granulocytes: 0.01 10*3/uL (ref 0.00–0.07)
Basophils Absolute: 0 10*3/uL (ref 0.0–0.1)
Basophils Relative: 0 %
Eosinophils Absolute: 0.2 10*3/uL (ref 0.0–0.5)
Eosinophils Relative: 3 %
HCT: 41.7 % (ref 39.0–52.0)
Hemoglobin: 13.9 g/dL (ref 13.0–17.0)
Immature Granulocytes: 0 %
Lymphocytes Relative: 14 %
Lymphs Abs: 0.9 10*3/uL (ref 0.7–4.0)
MCH: 29.6 pg (ref 26.0–34.0)
MCHC: 33.3 g/dL (ref 30.0–36.0)
MCV: 88.7 fL (ref 80.0–100.0)
Monocytes Absolute: 0.6 10*3/uL (ref 0.1–1.0)
Monocytes Relative: 9 %
Neutro Abs: 5 10*3/uL (ref 1.7–7.7)
Neutrophils Relative %: 74 %
Platelets: 320 10*3/uL (ref 150–400)
RBC: 4.7 MIL/uL (ref 4.22–5.81)
RDW: 13.7 % (ref 11.5–15.5)
WBC: 6.8 10*3/uL (ref 4.0–10.5)
nRBC: 0 % (ref 0.0–0.2)

## 2021-07-02 LAB — URINALYSIS, MICROSCOPIC (REFLEX)

## 2021-07-02 LAB — URINALYSIS, ROUTINE W REFLEX MICROSCOPIC
Glucose, UA: NEGATIVE mg/dL
Ketones, ur: NEGATIVE mg/dL
Leukocytes,Ua: NEGATIVE
Nitrite: NEGATIVE
Protein, ur: NEGATIVE mg/dL
Specific Gravity, Urine: 1.015 (ref 1.005–1.030)
pH: 6 (ref 5.0–8.0)

## 2021-07-02 LAB — LIPASE, BLOOD: Lipase: 148 U/L — ABNORMAL HIGH (ref 11–51)

## 2021-07-02 IMAGING — CT CT ABD-PELV W/ CM
2 of 4 series · 15 of 46 positions shown, 17 images · IV contrast (Omnipaque)
Comparison: None Available.

CLINICAL DATA: Abdominal pain radiating to the back for 2 weeks,
worse over the last 2 days. Some nausea vomiting.

EXAM:
CT ABDOMEN AND PELVIS WITH CONTRAST
TECHNIQUE: Multidetector CT imaging of the abdomen and pelvis was performed
using the standard protocol following bolus administration of
intravenous contrast.

[Series 2: axial st · axial · 0.98mm/px · z∈[-536,-36]mm · 12 of 115 slices shown, 14 images]
[im 10/115  soft-tissue]
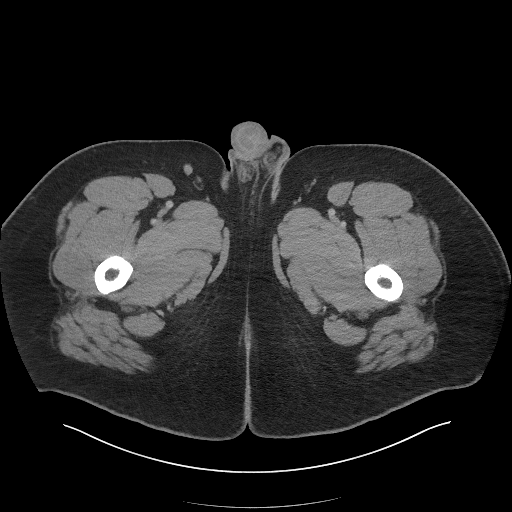
[im 10/115  bone]
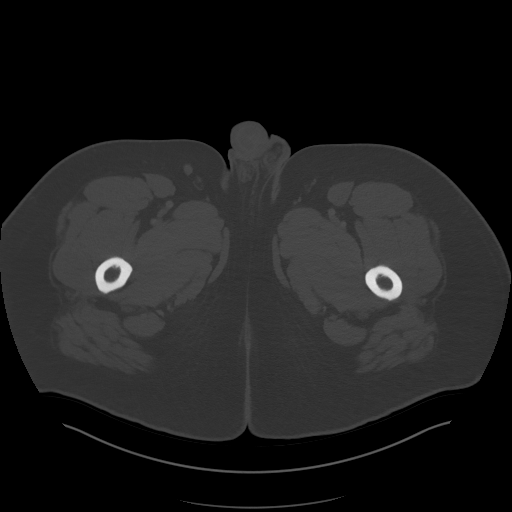
[im 19/115  soft-tissue]
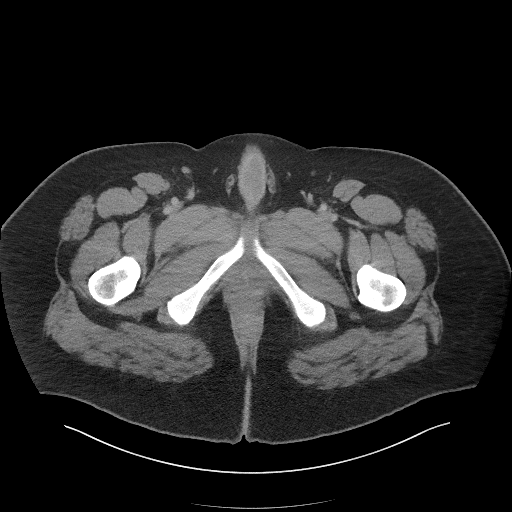
[im 28/115  soft-tissue]
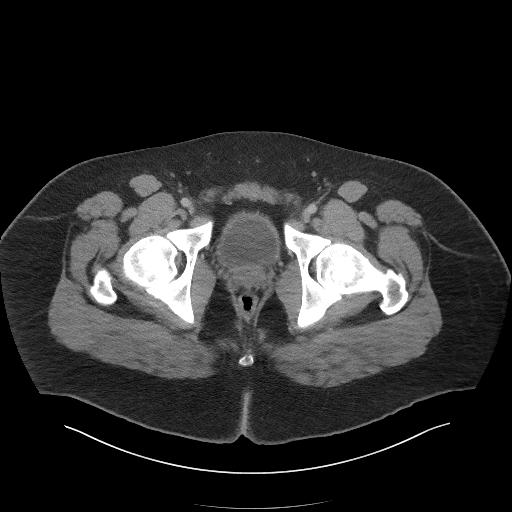
[im 37/115  soft-tissue]
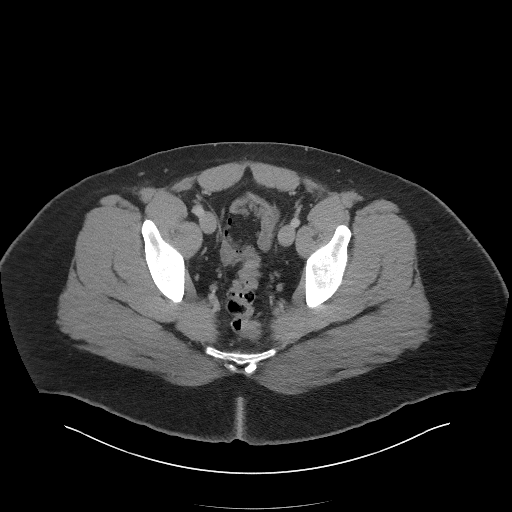
[im 46/115  soft-tissue]
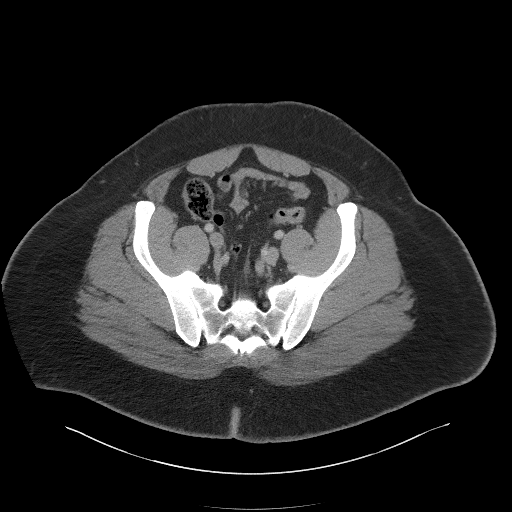
[im 55/115  soft-tissue]
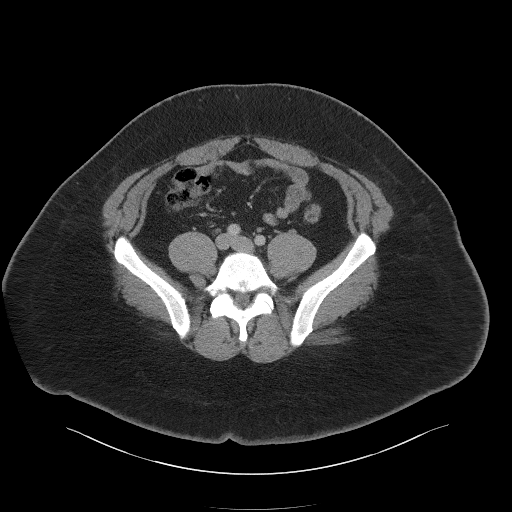
[im 64/115  soft-tissue]
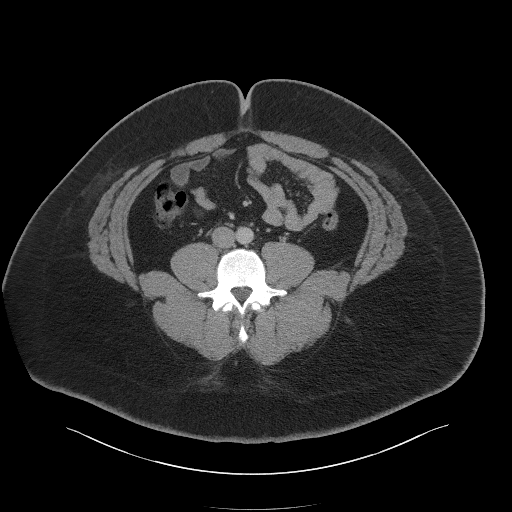
[im 73/115  soft-tissue]
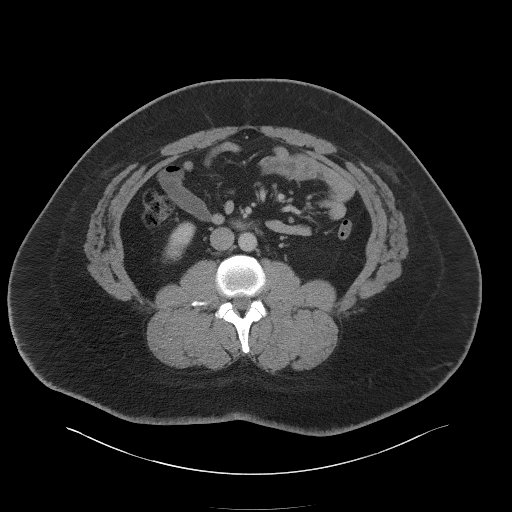
[im 83/115  soft-tissue]
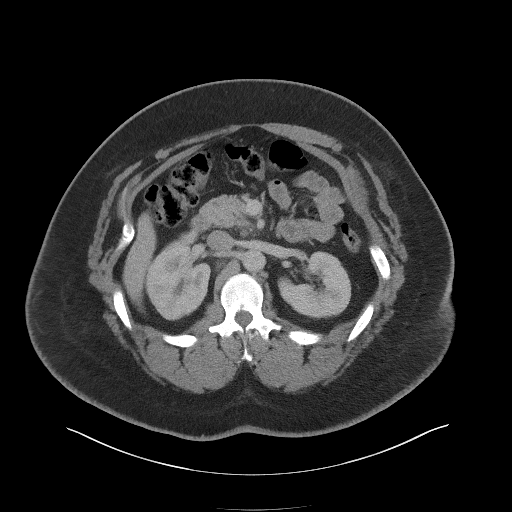
[im 83/115  bone]
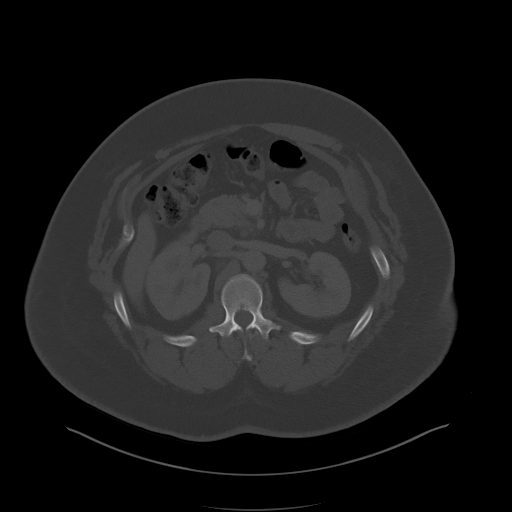
[im 92/115  soft-tissue]
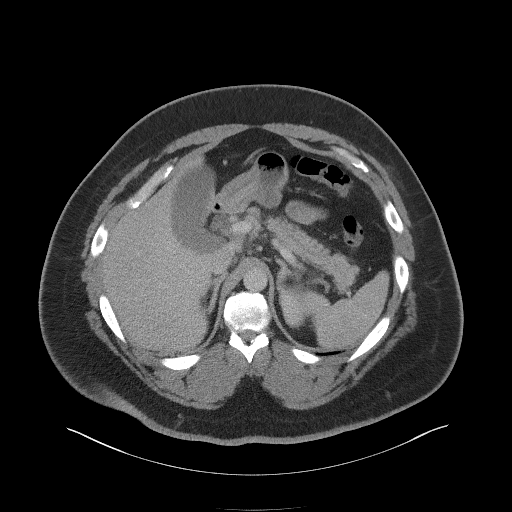
[im 101/115  soft-tissue]
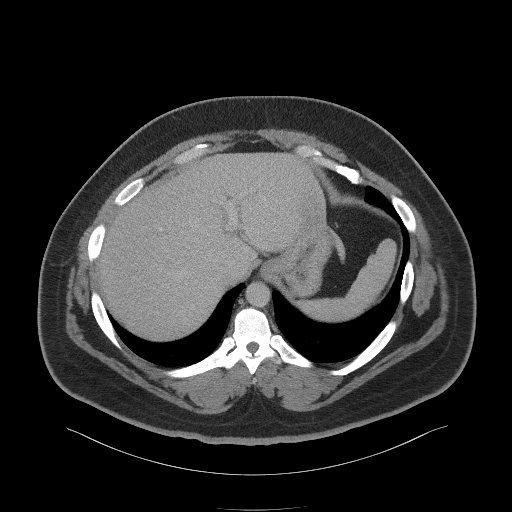
[im 110/115  soft-tissue]
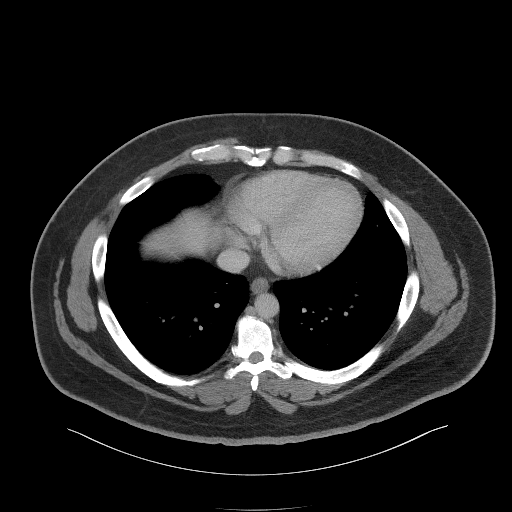

[Series 5: coronal st · coronal · 0.95mm/px · 3 of 121 slices shown]
[im 41/121  soft-tissue]
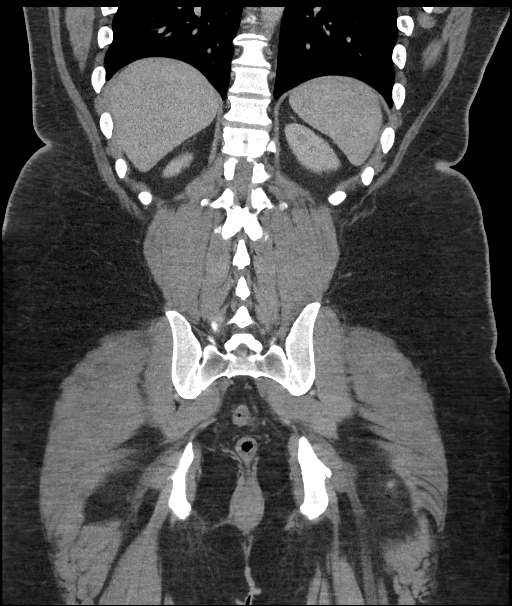
[im 54/121  soft-tissue]
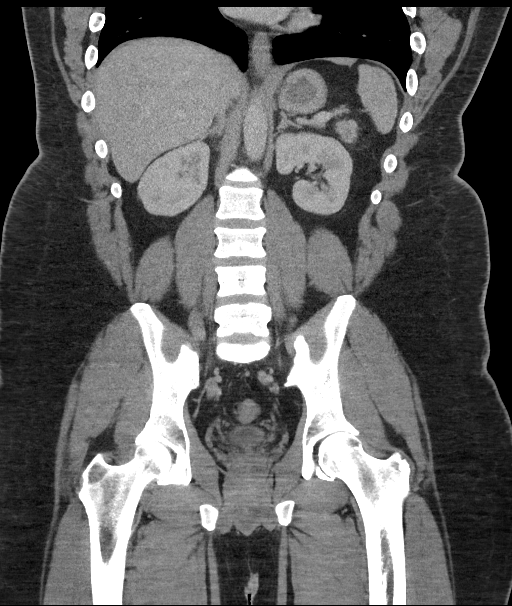
[im 67/121  soft-tissue]
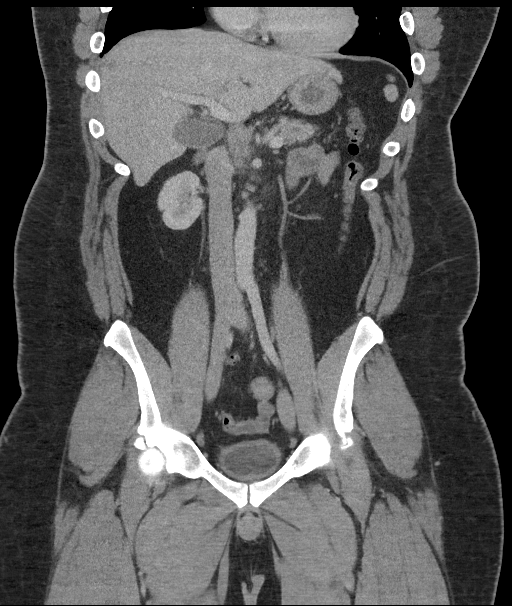

[15 of 46 positions shown; findings below may reference images not displayed]

RADIATION DOSE REDUCTION: This exam was performed according to the
departmental dose-optimization program which includes automated
exposure control, adjustment of the mA and/or kV according to
patient size and/or use of iterative reconstruction technique.

CONTRAST:  100mL OMNIPAQUE IOHEXOL 300 MG/ML  SOLN
FINDINGS: Lower chest: Clear lung bases.

Hepatobiliary: Liver normal in size. There are several subtle
hypoattenuating liver lesions, not well-defined, most apparent at
the lateral dome of segment 7, proximally 1.1 cm in size. No other
liver abnormality.

Gallbladder is distended, but without wall thickening or
pericholecystic fluid. Common bile duct is mildly dilated, 8 mm
proximally.

Pancreas: There is a dystrophic appearing calcification in the upper
pancreatic head surrounded by vague area of relative
hypoattenuation, which may reflect a mass. The possible mass
measures 2.6 cm. No other evidence of a pancreatic mass. The
pancreatic duct distal to the possible mass is dilated to 4 mm. No
pancreatic inflammation.

Spleen: Normal in size without focal abnormality.

Adrenals/Urinary Tract: Adrenal glands are unremarkable. Kidneys are
normal, without renal calculi, focal lesion, or hydronephrosis.
Bladder is unremarkable.

Stomach/Bowel: Stomach is within normal limits. Appendix appears
normal. No evidence of bowel wall thickening, distention, or
inflammatory changes.

Vascular/Lymphatic: Mildly enlarged retroperitoneal lymph nodes at
the level of the upper pole the right kidney, posterior to the
inferior vena cava, 1.5 x 1.4 cm in short axis ease. Mildly
prominent, but subcentimeter peripancreatic and celiac lymph nodes.
No other adenopathy. No vascular abnormality. Portal vein, splenic
vein and superior mesenteric vein are widely patent.

Reproductive: Unremarkable.

Other: No abdominal wall hernia or abnormality. No abdominopelvic
ascites.

Musculoskeletal: No fracture or acute finding.  No bone lesion.
IMPRESSION: 1. Possible 2.6 cm pancreatic mass suspicious for neoplasm,
associated with mild dilation of the pancreatic duct distal to the
apparent mass, mild dilation of the common bile duct and 2 adjacent
enlarged retroperitoneal lymph nodes. There are also subtle
low-attenuation lesions in the liver that are not well-defined,
possibly metastatic disease. Recommend follow-up pancreatic MRI
without and with contrast for further assessment.
2. No other abnormalities.  No acute findings.

## 2021-07-02 IMAGING — MR MR ABDOMEN WO/W CM MRCP
18 of 21 series · 41 of 48 positions shown · IV contrast (gadavist)
Comparison: CT abdomen/pelvis dated [DATE]

CLINICAL DATA: Abdominal pain, nausea/vomiting, possible pancreatic
mass on CT

EXAM:
MRI ABDOMEN WITHOUT AND WITH CONTRAST (INCLUDING MRCP)
TECHNIQUE: Multiplanar multisequence MR imaging of the abdomen was performed
both before and after the administration of intravenous contrast.
Heavily T2-weighted images of the biliary and pancreatic ducts were
obtained, and three-dimensional MRCP images were rendered by post
processing.
CONTRAST:  10mL GADAVIST GADOBUTROL 1 MMOL/ML IV SOLN

[Series 3: T2 fat-sat · axial · 6.0mm · 1.25mm/px · 1 of 40 slices shown]
[im 1/40]
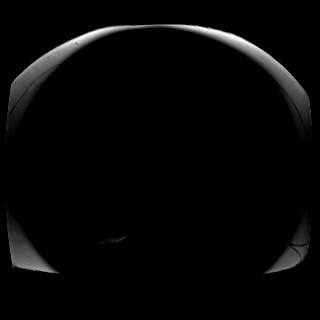

[Series 6: T2 · coronal · 6.0mm · 1.56mm/px · 1 of 35 slices shown (1 of 2)]
[im 1/35]
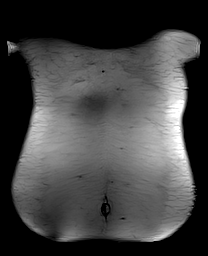

[Series 8: DWI · axial · 6.0mm · 1.49mm/px · z∈[-85,+195]mm · 3 of 80 slices shown (1 of 2)]
[im 1/80]
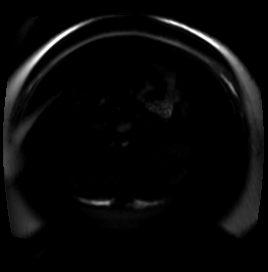
[im 40/80]
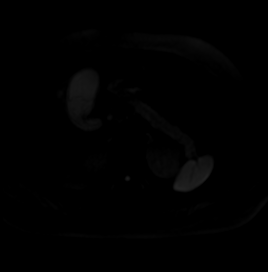
[im 80/80]
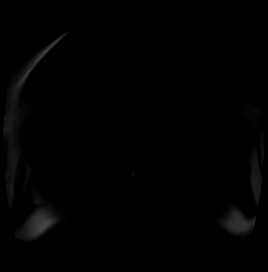

[Series 9: DWI · axial · 6.0mm · 1.49mm/px · 1 of 40 slices shown (2 of 2)]
[im 1/40]
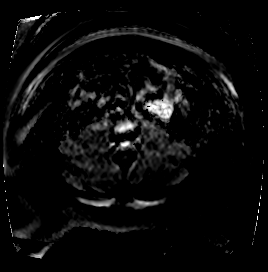

[Series 10: T1 · axial · 3.0mm · 1.25mm/px · z∈[-70,+191]mm · 3 of 88 slices shown (1 of 2)]
[im 1/88]
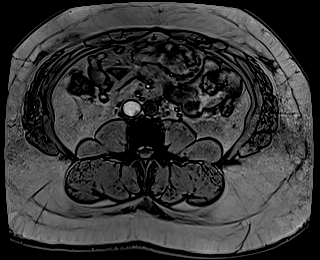
[im 44/88]
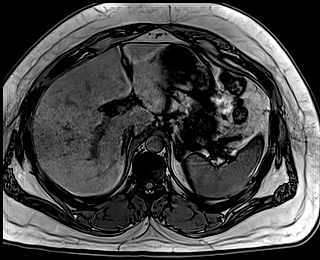
[im 88/88]
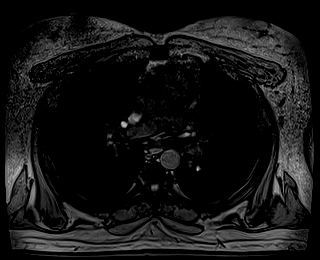

[Series 11: T1 · axial · 3.0mm · 1.25mm/px · z∈[-70,+191]mm · 3 of 88 slices shown (2 of 2)]
[im 1/88]
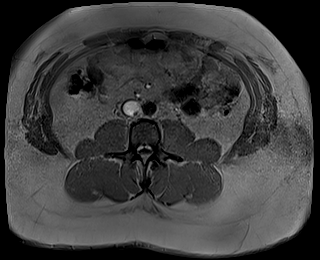
[im 44/88]
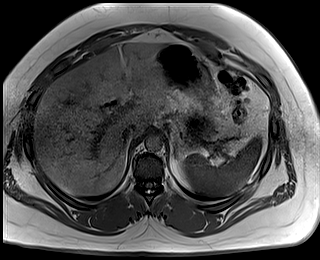
[im 88/88]
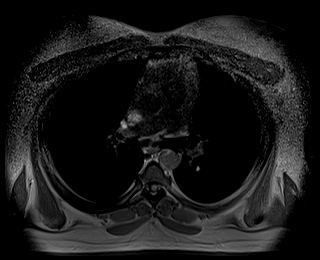

[Series 12: cor obl thk · sagittal · 50.0mm · 0.78mm/px · 1 of 9 slices shown]
[im 1/9]
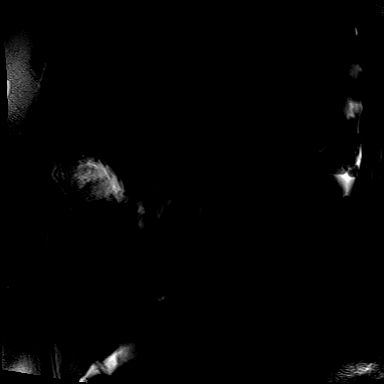

[Series 14: cor_3d_spc_trig · coronal · 1.0mm · 0.49mm/px · 2 of 72 slices shown]
[im 1/72]
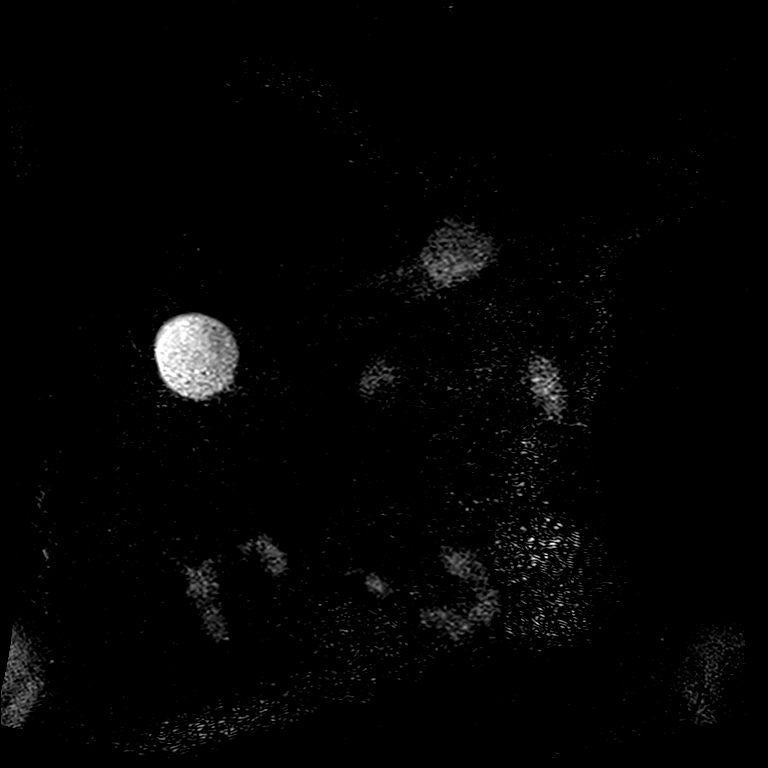
[im 72/72]
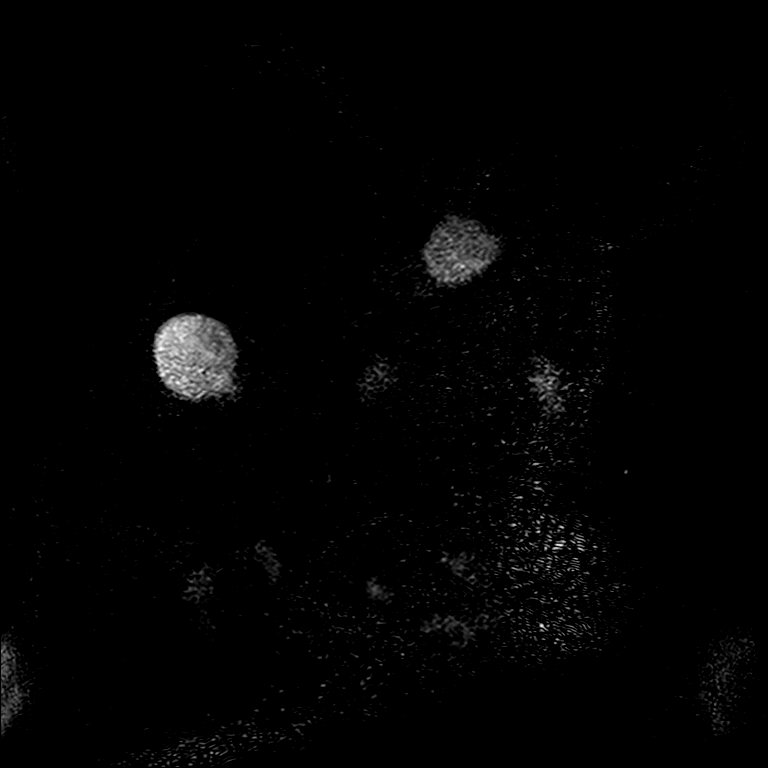

[Series 16: T2 · axial · 6.0mm · 1.56mm/px · 1 of 36 slices shown (2 of 2)]
[im 1/36]
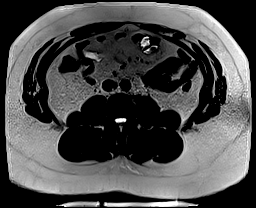

[Series 18: T1 dynamic · axial · 3.0mm · 1.25mm/px · z∈[-87,+198]mm · 3 of 96 slices shown (1 of 9)]
[im 1/96]
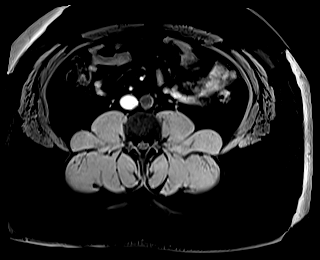
[im 48/96]
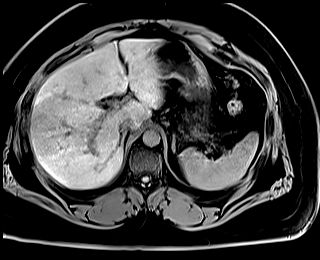
[im 96/96]
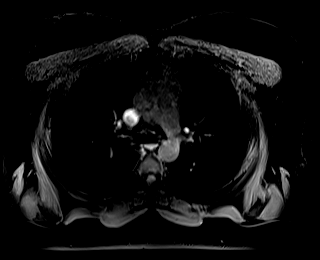

[Series 22: T1 dynamic · axial · 3.0mm · 1.25mm/px · z∈[-87,+198]mm · 3 of 96 slices shown (2 of 9)]
[im 1/96]
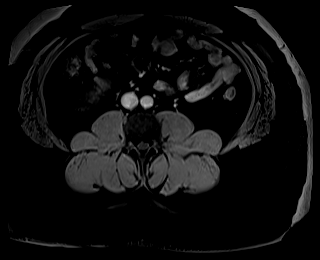
[im 48/96]
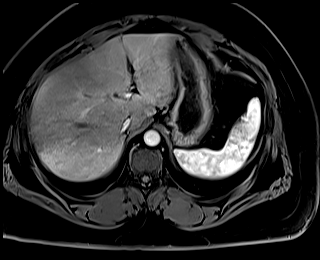
[im 96/96]
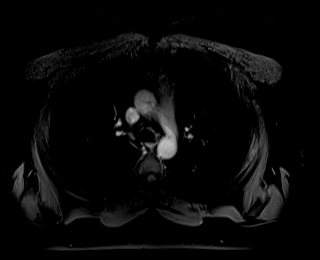

[Series 23: T1 dynamic · axial · 3.0mm · 1.25mm/px · z∈[-87,+198]mm · 3 of 96 slices shown (3 of 9)]
[im 1/96]
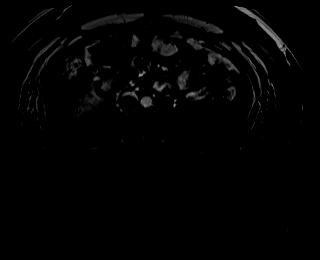
[im 48/96]
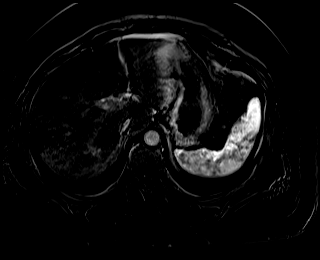
[im 96/96]
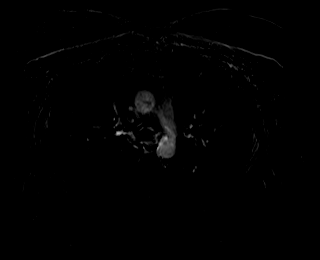

[Series 26: T1 dynamic · axial · 3.0mm · 1.25mm/px · z∈[-87,+198]mm · 3 of 96 slices shown (4 of 9)]
[im 1/96]
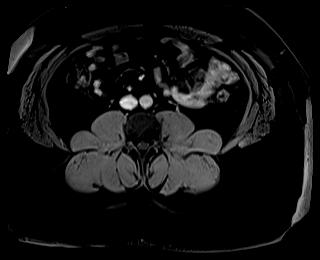
[im 48/96]
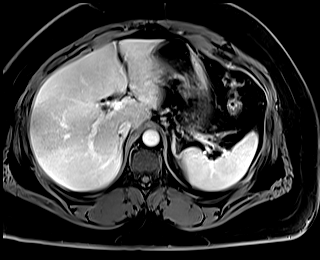
[im 96/96]
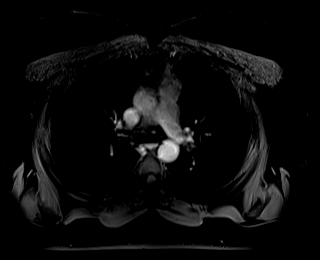

[Series 27: T1 dynamic · axial · 3.0mm · 1.25mm/px · z∈[-87,+198]mm · 3 of 96 slices shown (5 of 9)]
[im 1/96]
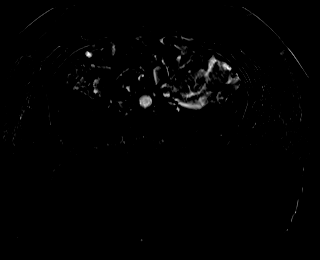
[im 48/96]
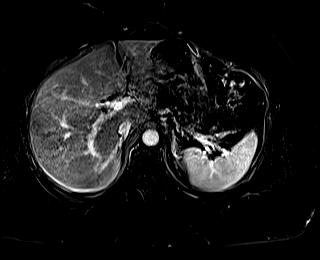
[im 96/96]
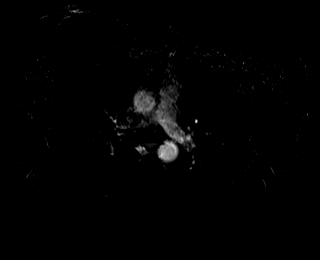

[Series 30: T1 dynamic · axial · 3.0mm · 1.25mm/px · z∈[-87,+198]mm · 3 of 96 slices shown (6 of 9)]
[im 1/96]
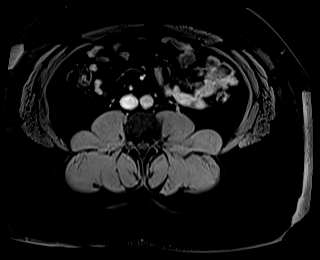
[im 48/96]
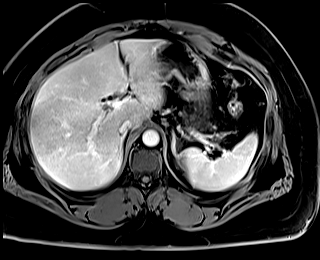
[im 96/96]
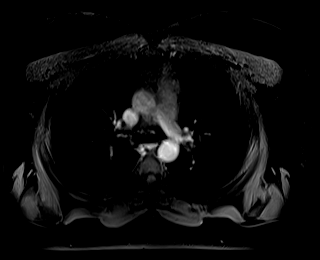

[Series 31: T1 dynamic · axial · 3.0mm · 1.25mm/px · z∈[-87,+198]mm · 3 of 96 slices shown (7 of 9)]
[im 1/96]
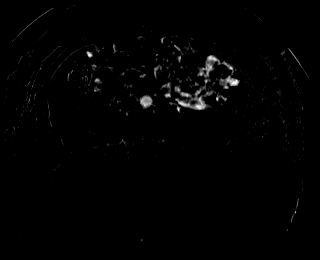
[im 48/96]
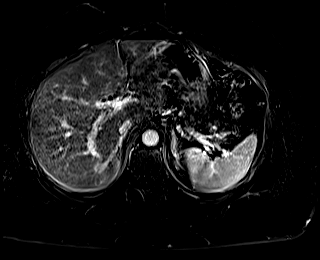
[im 96/96]
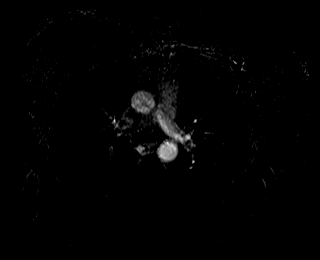

[Series 33: T1 dynamic · coronal · 3.0mm · 1.41mm/px · 2 of 72 slices shown (8 of 9)]
[im 1/72]
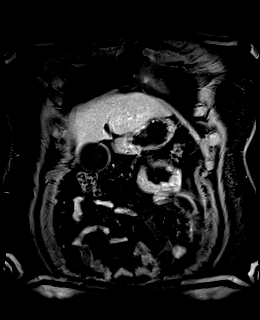
[im 72/72]
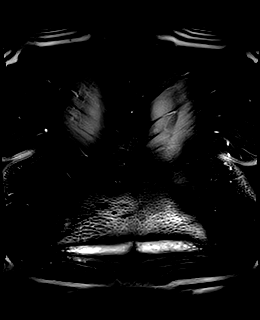

[Series 36: T1 dynamic · axial · 3.0mm · 1.25mm/px · z∈[-87,+54]mm · 2 of 96 slices shown (9 of 9)]
[im 1/96]
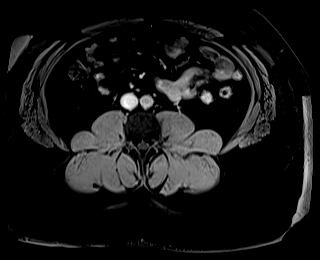
[im 48/96]
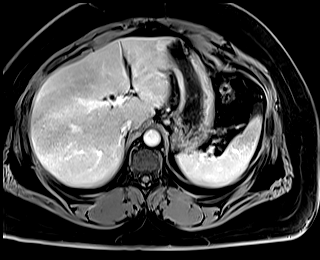

[41 of 48 positions shown; findings below may reference images not displayed]

FINDINGS: Motion degraded images.

Lower chest: Lung bases are clear.

Hepatobiliary: Two subtle liver lesions, measuring 11 mm in segment
8 (series 4/image 9) and 12 mm in segment 5 along the gallbladder
fossa (series 4/image 21). These are poorly evaluated due to motion
degradation, but enhancement is favored at least within the segment
5 lesion on postcontrast subtraction imaging (series 31/image 62).
Associated restricted diffusion. Three additional smaller lesions
are present on diffusion imaging (series 8/images 55 and 63). These
findings are concerning for metastatic disease.

Gallbladder is unremarkable. No cholelithiasis or inflammatory
changes. No intrahepatic or extrahepatic ductal dilatation. No
choledocholithiasis is evident.

Pancreas: 3.1 x 2.3 cm hypoenhancing mass along the posteromedial
aspect of the pancreatic head (series 22/image 67), suspicious for
pancreatic neoplasm. Associated mild dilatation of the main
pancreatic duct (series 4/image 20), which abruptly ends at the
level of the suspected mass (series 4/image 23). No pancreatic
atrophy.

Spleen:  Within normal limits.

Adrenals/Urinary Tract:  Adrenal glands are within normal limits.

Kidneys are within normal limits.  No hydronephrosis.

Stomach/Bowel: Stomach is within normal limits.

Visualized bowel is unremarkable.

Vascular/Lymphatic:  No evidence of abdominal aortic aneurysm.

Celiac artery, SMA, and portal vein appear distinct from the mass,
without definite vascular invasion.

Upper abdominal lymphadenopathy, including a 16 mm short axis
portacaval node (series 4/image 21) and a 17 mm short axis right
retrocaval node at the level of the right renal vein.

Other:  No abdominal ascites.

Musculoskeletal: No focal osseous lesions.
IMPRESSION: 3.1 x 2.3 cm hypoenhancing mass along the posteromedial aspect of
the pancreatic head, suspicious for pancreatic neoplasm. EUS is
suggested.

Multiple small liver lesions, suspicious for hepatic metastases.

Upper abdominal/retroperitoneal lymphadenopathy, suspicious for
nodal metastases.

## 2021-07-02 IMAGING — MR MR 3D RECON AT SCANNER
18 of 20 series · 41 of 48 positions shown · IV contrast (10 GADAVIST)
Comparison: CT abdomen/pelvis dated [DATE]

CLINICAL DATA: Abdominal pain, nausea/vomiting, possible pancreatic
mass on CT

EXAM:
MRI ABDOMEN WITHOUT AND WITH CONTRAST (INCLUDING MRCP)
TECHNIQUE: Multiplanar multisequence MR imaging of the abdomen was performed
both before and after the administration of intravenous contrast.
Heavily T2-weighted images of the biliary and pancreatic ducts were
obtained, and three-dimensional MRCP images were rendered by post
processing.
CONTRAST:  10mL GADAVIST GADOBUTROL 1 MMOL/ML IV SOLN

[Series 4: T2 fat-sat · axial · 6.0mm · 1.25mm/px · z∈[-88,+193]mm · 2 of 40 slices shown]
[im 1/40]
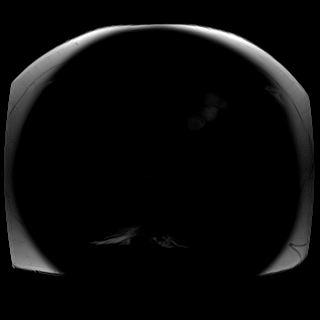
[im 40/40]
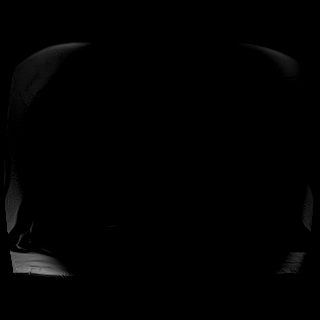

[Series 7: T2 · coronal · 6.0mm · 1.56mm/px · 1 of 35 slices shown (1 of 2)]
[im 1/35]
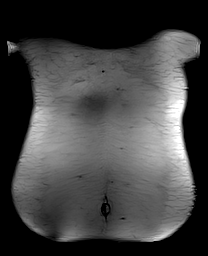

[Series 8: DWI · axial · 6.0mm · 1.49mm/px · z∈[-85,+195]mm · 3 of 80 slices shown (1 of 2)]
[im 1/80]
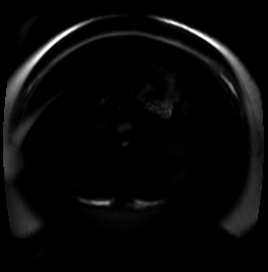
[im 40/80]
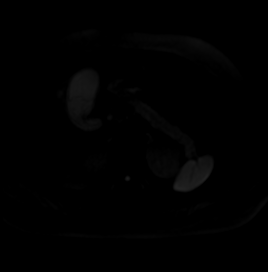
[im 80/80]
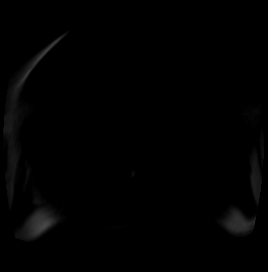

[Series 9: DWI · axial · 6.0mm · 1.49mm/px · 1 of 40 slices shown (2 of 2)]
[im 1/40]
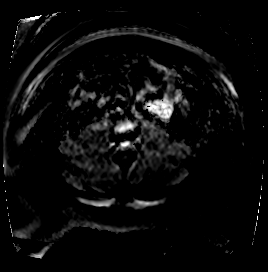

[Series 10: T1 · axial · 3.0mm · 1.25mm/px · z∈[-70,+191]mm · 3 of 88 slices shown (1 of 2)]
[im 1/88]
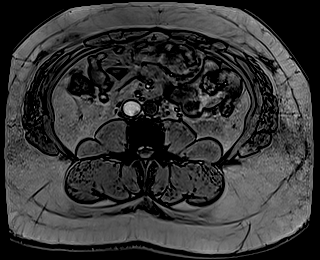
[im 44/88]
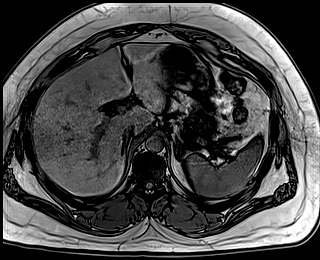
[im 88/88]
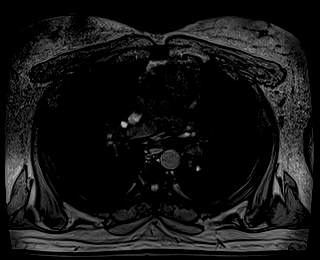

[Series 11: T1 · axial · 3.0mm · 1.25mm/px · z∈[-70,+191]mm · 3 of 88 slices shown (2 of 2)]
[im 1/88]
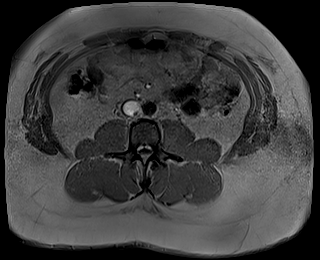
[im 44/88]
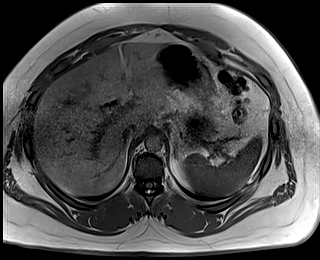
[im 88/88]
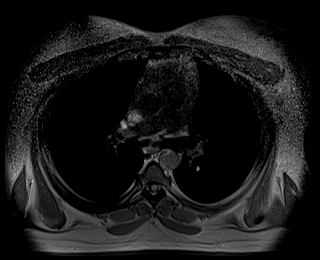

[Series 12: cor obl thk · sagittal · 50.0mm · 0.78mm/px · 1 of 9 slices shown]
[im 1/9]
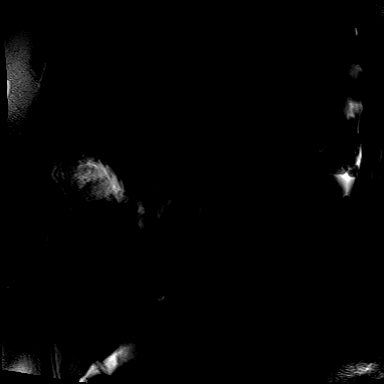

[Series 14: cor_3d_spc_trig · coronal · 1.0mm · 0.49mm/px · 2 of 72 slices shown]
[im 1/72]
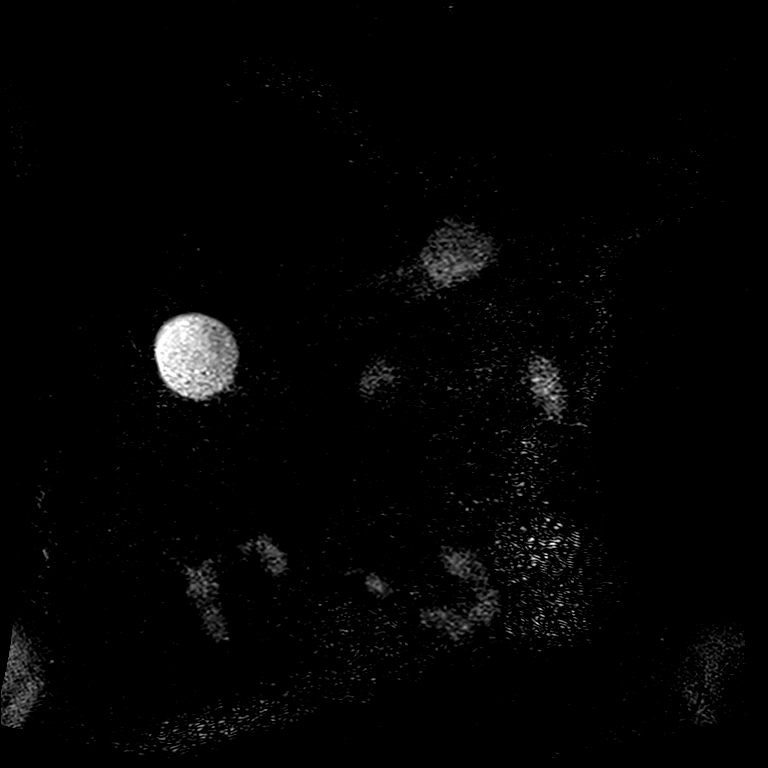
[im 72/72]
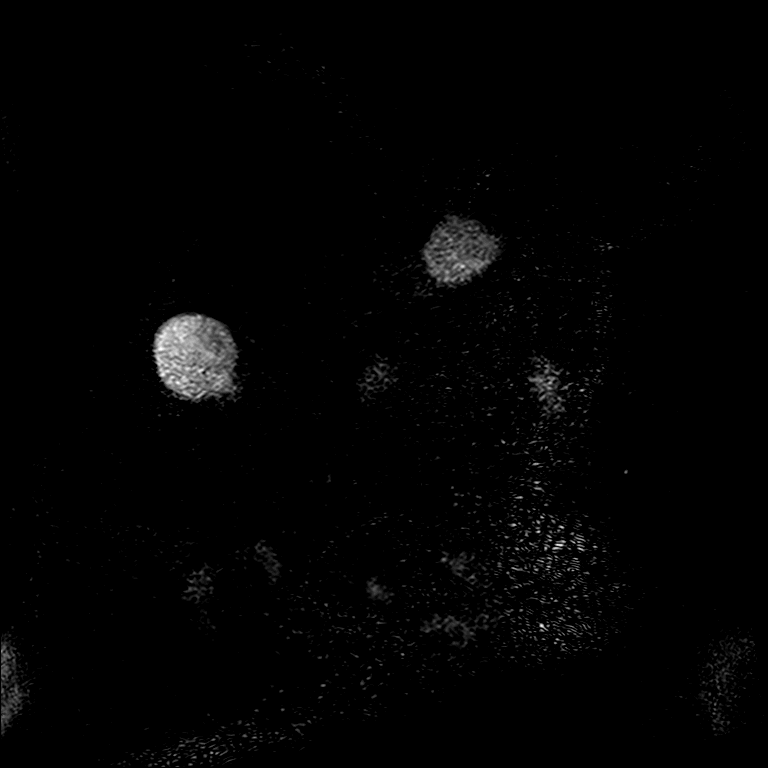

[Series 16: T2 · axial · 6.0mm · 1.56mm/px · 1 of 36 slices shown (2 of 2)]
[im 1/36]
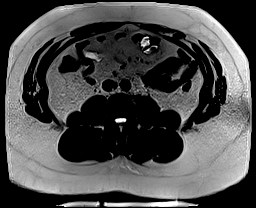

[Series 18: T1 dynamic · axial · 3.0mm · 1.25mm/px · z∈[-87,+198]mm · 3 of 96 slices shown (1 of 9)]
[im 1/96]
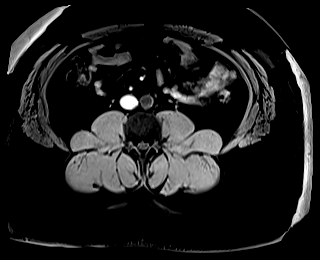
[im 48/96]
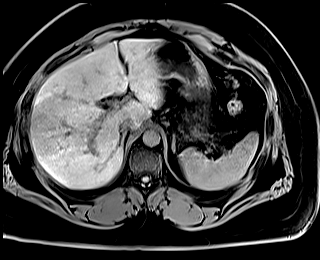
[im 96/96]
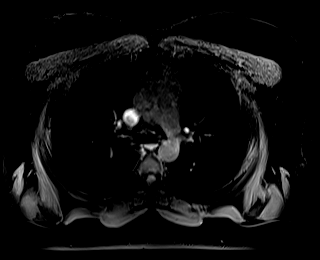

[Series 22: T1 dynamic · axial · 3.0mm · 1.25mm/px · z∈[-87,+198]mm · 3 of 96 slices shown (2 of 9)]
[im 1/96]
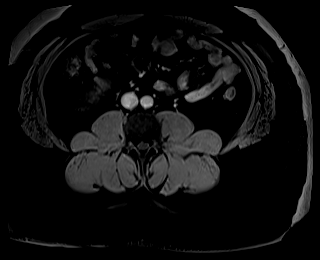
[im 48/96]
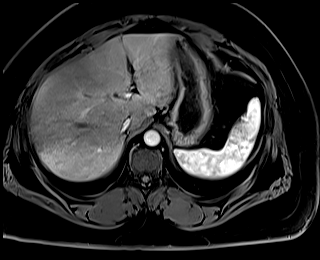
[im 96/96]
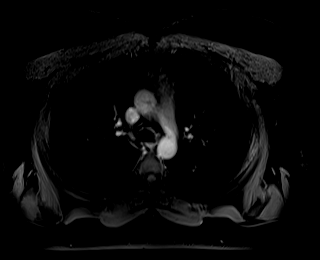

[Series 23: T1 dynamic · axial · 3.0mm · 1.25mm/px · z∈[-87,+198]mm · 3 of 96 slices shown (3 of 9)]
[im 1/96]
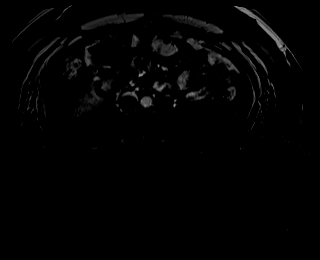
[im 48/96]
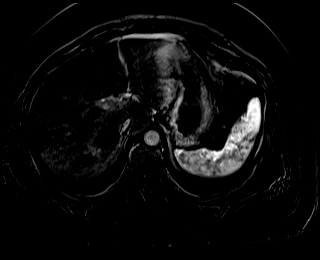
[im 96/96]
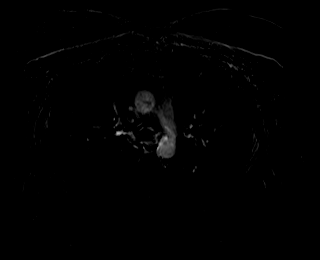

[Series 26: T1 dynamic · axial · 3.0mm · 1.25mm/px · z∈[-87,+198]mm · 3 of 91 slices shown (4 of 9)]
[im 1/91]
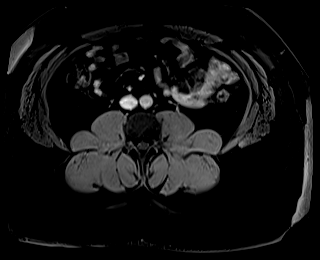
[im 46/91]
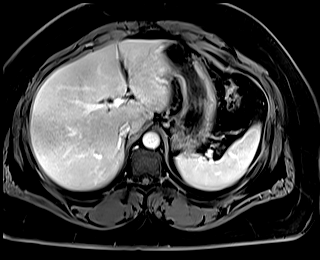
[im 91/91]
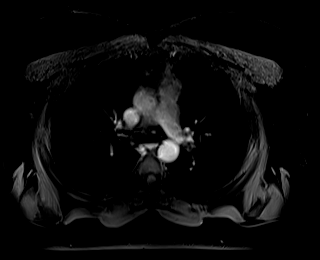

[Series 27: T1 dynamic · axial · 3.0mm · 1.25mm/px · z∈[-87,+198]mm · 3 of 96 slices shown (5 of 9)]
[im 1/96]
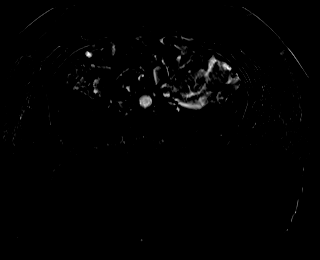
[im 48/96]
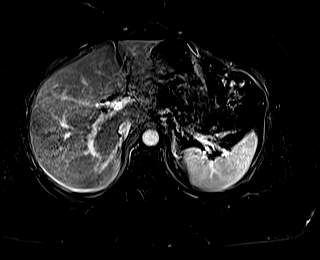
[im 96/96]
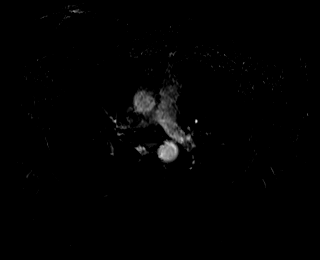

[Series 30: T1 dynamic · axial · 3.0mm · 1.25mm/px · z∈[-87,+198]mm · 3 of 96 slices shown (6 of 9)]
[im 1/96]
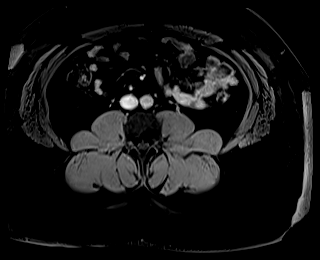
[im 48/96]
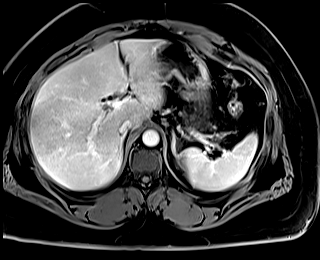
[im 96/96]
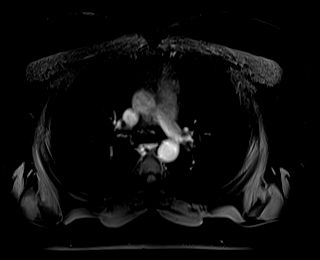

[Series 31: T1 dynamic · axial · 3.0mm · 1.25mm/px · z∈[-87,+198]mm · 3 of 96 slices shown (7 of 9)]
[im 1/96]
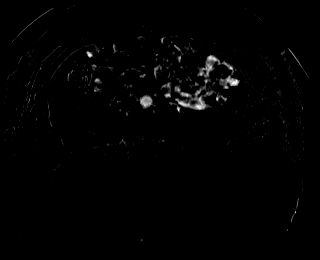
[im 48/96]
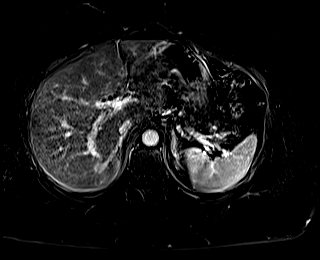
[im 96/96]
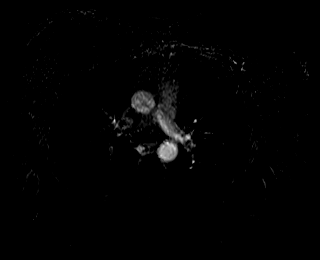

[Series 33: T1 dynamic · coronal · 3.0mm · 1.41mm/px · 2 of 72 slices shown (8 of 9)]
[im 1/72]
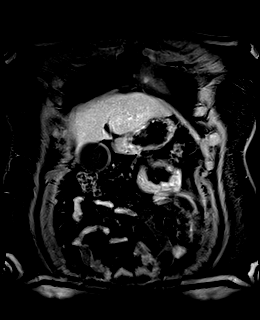
[im 72/72]
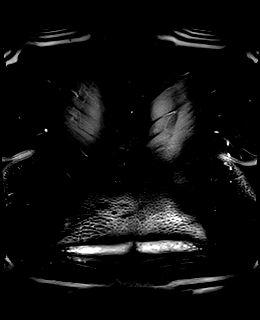

[Series 36: T1 dynamic · axial · 3.0mm · 1.25mm/px · 1 of 96 slices shown (9 of 9)]
[im 1/96]
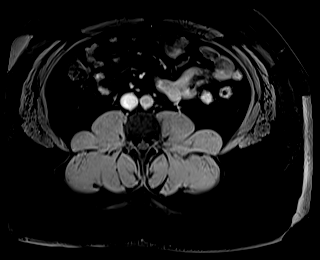

[41 of 48 positions shown; findings below may reference images not displayed]

FINDINGS: Motion degraded images.

Lower chest: Lung bases are clear.

Hepatobiliary: Two subtle liver lesions, measuring 11 mm in segment
8 (series 4/image 9) and 12 mm in segment 5 along the gallbladder
fossa (series 4/image 21). These are poorly evaluated due to motion
degradation, but enhancement is favored at least within the segment
5 lesion on postcontrast subtraction imaging (series 31/image 62).
Associated restricted diffusion. Three additional smaller lesions
are present on diffusion imaging (series 8/images 55 and 63). These
findings are concerning for metastatic disease.

Gallbladder is unremarkable. No cholelithiasis or inflammatory
changes. No intrahepatic or extrahepatic ductal dilatation. No
choledocholithiasis is evident.

Pancreas: 3.1 x 2.3 cm hypoenhancing mass along the posteromedial
aspect of the pancreatic head (series 22/image 67), suspicious for
pancreatic neoplasm. Associated mild dilatation of the main
pancreatic duct (series 4/image 20), which abruptly ends at the
level of the suspected mass (series 4/image 23). No pancreatic
atrophy.

Spleen:  Within normal limits.

Adrenals/Urinary Tract:  Adrenal glands are within normal limits.

Kidneys are within normal limits.  No hydronephrosis.

Stomach/Bowel: Stomach is within normal limits.

Visualized bowel is unremarkable.

Vascular/Lymphatic:  No evidence of abdominal aortic aneurysm.

Celiac artery, SMA, and portal vein appear distinct from the mass,
without definite vascular invasion.

Upper abdominal lymphadenopathy, including a 16 mm short axis
portacaval node (series 4/image 21) and a 17 mm short axis right
retrocaval node at the level of the right renal vein.

Other:  No abdominal ascites.

Musculoskeletal: No focal osseous lesions.
IMPRESSION: 3.1 x 2.3 cm hypoenhancing mass along the posteromedial aspect of
the pancreatic head, suspicious for pancreatic neoplasm. EUS is
suggested.

Multiple small liver lesions, suspicious for hepatic metastases.

Upper abdominal/retroperitoneal lymphadenopathy, suspicious for
nodal metastases.

## 2021-07-02 MED ORDER — GADOBUTROL 1 MMOL/ML IV SOLN
10.0000 mL | Freq: Once | INTRAVENOUS | Status: AC | PRN
  Administered 2021-07-02: 10 mL via INTRAVENOUS

## 2021-07-02 MED ORDER — ALBUTEROL SULFATE (2.5 MG/3ML) 0.083% IN NEBU: 2.5000 mg | INHALATION_SOLUTION | Freq: Four times a day (QID) | RESPIRATORY_TRACT | Status: DC | PRN

## 2021-07-02 MED ORDER — PROCHLORPERAZINE EDISYLATE 10 MG/2ML IJ SOLN
10.0000 mg | Freq: Four times a day (QID) | INTRAMUSCULAR | Status: DC | PRN
Start: 2021-07-02 — End: 2021-07-05

## 2021-07-02 MED ORDER — HYDROMORPHONE HCL 1 MG/ML IJ SOLN: 0.5000 mg | INTRAMUSCULAR | Status: DC | PRN

## 2021-07-02 MED ORDER — OXYCODONE HCL 5 MG PO TABS
5.0000 mg | ORAL_TABLET | ORAL | Status: DC | PRN
  Administered 2021-07-02 – 2021-07-04 (×3): 5 mg via ORAL
  Filled 2021-07-02 (×3): qty 1

## 2021-07-02 MED ORDER — KETOROLAC TROMETHAMINE 15 MG/ML IJ SOLN
15.0000 mg | Freq: Once | INTRAMUSCULAR | Status: AC
  Administered 2021-07-02: 15 mg via INTRAVENOUS
  Filled 2021-07-02: qty 1

## 2021-07-02 MED ORDER — ONDANSETRON HCL 4 MG/2ML IJ SOLN
4.0000 mg | Freq: Once | INTRAMUSCULAR | Status: AC
  Administered 2021-07-02: 4 mg via INTRAVENOUS
  Filled 2021-07-02: qty 2

## 2021-07-02 MED ORDER — ENOXAPARIN SODIUM 40 MG/0.4ML IJ SOSY
40.0000 mg | PREFILLED_SYRINGE | INTRAMUSCULAR | Status: DC
Start: 2021-07-02 — End: 2021-07-05
  Administered 2021-07-02 – 2021-07-04 (×3): 40 mg via SUBCUTANEOUS
  Filled 2021-07-02 (×3): qty 0.4

## 2021-07-02 MED ORDER — SENNOSIDES-DOCUSATE SODIUM 8.6-50 MG PO TABS
2.0000 | ORAL_TABLET | Freq: Two times a day (BID) | ORAL | Status: DC
  Administered 2021-07-02 – 2021-07-05 (×5): 2 via ORAL
  Filled 2021-07-02 (×6): qty 2

## 2021-07-02 MED ORDER — MELATONIN 5 MG PO TABS: 5.0000 mg | ORAL_TABLET | Freq: Every evening | ORAL | Status: DC | PRN

## 2021-07-02 MED ORDER — BOOST / RESOURCE BREEZE PO LIQD CUSTOM
1.0000 | Freq: Three times a day (TID) | ORAL | Status: DC
  Administered 2021-07-02 – 2021-07-05 (×5): 1 via ORAL

## 2021-07-02 MED ORDER — ALBUTEROL SULFATE HFA 108 (90 BASE) MCG/ACT IN AERS: 2.0000 | INHALATION_SPRAY | RESPIRATORY_TRACT | Status: DC | PRN

## 2021-07-02 MED ORDER — POTASSIUM CHLORIDE CRYS ER 20 MEQ PO TBCR
40.0000 meq | EXTENDED_RELEASE_TABLET | Freq: Once | ORAL | Status: AC
  Administered 2021-07-02: 40 meq via ORAL
  Filled 2021-07-02: qty 2

## 2021-07-02 MED ORDER — POTASSIUM CHLORIDE 2 MEQ/ML IV SOLN
INTRAVENOUS | Status: AC
  Filled 2021-07-02 (×3): qty 1000

## 2021-07-02 MED ORDER — IOHEXOL 300 MG/ML  SOLN
100.0000 mL | Freq: Once | INTRAMUSCULAR | Status: AC | PRN
  Administered 2021-07-02: 100 mL via INTRAVENOUS

## 2021-07-02 MED ORDER — POLYETHYLENE GLYCOL 3350 17 G PO PACK: 17.0000 g | PACK | Freq: Every day | ORAL | Status: DC | PRN

## 2021-07-02 NOTE — H&P (Signed)
History and Physical  Leon Fischer. YSA:[id number] DOB: 1990-09-28 DOA: 07/02/2021  Referring physician: Admitted by Dr. Cyndia Skeeters, [facility], hospitalist team. PCP: Patient, No Pcp Per (Inactive)  Outpatient Specialists: None. Patient coming from: Home via [facility] ED transfer.  Chief Complaint: Abdominal pain  HPI: [name]. is a 31 y.o. male with medical history significant for asthma, severe morbid obesity, who initially presented to [facility] ED with complaints of worsening intermittent diffuse abdominal pain, worse at epigastric region and radiating to his back.  Associated with poor oral intake, nausea, vomiting, and constipation, although had a small bowel movement yesterday.  Upon presentation to the ED, he had lab work that returned with elevated liver chemistries, elevated lipase level 148, T bilirubin 2.2.  CT abdomen and pelvis with contrast revealed possible 2.6 cm pancreatic mass suspicious for neoplasm, associated with mild dilatation of the pancreatic duct distal to the apparent mass, mild dilatation of the common bile duct 8 mm and 2 adjacent enlarged retroperitoneal lymph nodes.  Also noted, subtotal low-attenuation lesions in the liver that are not well-defined, possibly metastatic disease.  Recommended follow-up pancreatic MRI without and with contrast for further assessment.    EDP consulted GI Dr. Alessandra Bevels who will see in consultation.  The patient received IV Toradol 50 mg x 1 and IV Zofran 4 mg x 1 in the ED.  EDP requested admission.  Patient was admitted by [facility] hospitalist service, as a direct transfer from [facility] ED to [place] unit.  At the time of this visit the patient is alert and oriented x4.  Reports diffuse abdominal pain, currently 4 out of 10.  Nauseous with last nonbloody nonbilious emesis this morning.  His mother is present at bedside.  Discussed the case with Dr. Alessandra Bevels who will see in consultation in the morning.  The patient was started on IV  fluid, analgesics as needed, and IV antiemetics as needed.  ED Course: Tmax 98.7.  BP 155/95, pulse 64, respiratory rate 18, saturation 100% on room air.  Lab studies remarkable for serum potassium 3.4, serum glucose 123, alkaline phosphatase 252, lipase 148, AST 77, ALT 1293.  T. bili 2.2.  Review of Systems: Review of systems as noted in the HPI. All other systems reviewed and are negative.   Past Medical History:  Diagnosis Date   Asthma    History reviewed. No pertinent surgical history.  Social History:  reports that he has been smoking. He has never used smokeless tobacco. He reports current alcohol use. He reports that he does not use drugs.   Allergies  Allergen Reactions   Shellfish Allergy Swelling and Shortness Of Breath    Eyes, throat swell shut.    Other     Family History  Problem Relation Age of Onset   Drug abuse Father     Maternal uncle deceased, with history of ulcerative colitis.  Prior to Admission medications   Medication Sig Start Date End Date Taking? Authorizing Provider  albuterol (PROVENTIL) (2.5 MG/3ML) 0.083% nebulizer solution Take 3 mLs (2.5 mg total) by nebulization every 6 (six) hours as needed for wheezing or shortness of breath. 12/20/18   Robinson, [place] N, PA-C  albuterol (VENTOLIN HFA) 108 (90 Base) MCG/ACT inhaler Inhale 2 puffs into the lungs every 4 (four) hours as needed for wheezing or shortness of breath. 12/20/18   Robinson, [place] N, PA-C    Physical Exam: BP (!) 155/95 (BP Location: Right Arm)   Pulse 64   Temp 98  F (36.7 C) (Oral)   Resp 18   Ht 6' (1.829 m)   Wt (!) 147.7 kg   SpO2 100%   BMI 44.16 kg/m   General: 31 y.o. year-old male well developed well nourished in no acute distress.  Alert and oriented x3. Cardiovascular: Regular rate and rhythm with no rubs or gallops.  No thyromegaly or JVD noted.  No lower extremity edema. 2/4 pulses in all 4 extremities. Respiratory: Clear to auscultation with no wheezes or  rales. Good inspiratory effort. Abdomen: Soft diffusely tender worse at epigastric region.  Obese with hypoactive bowel sounds. Muskuloskeletal: No cyanosis, clubbing or edema noted bilaterally Neuro: CN II-XII intact, strength, sensation, reflexes Skin: No ulcerative lesions noted or rashes Psychiatry: Judgement and insight appear normal. Mood is appropriate for condition and setting          Labs on Admission:  Basic Metabolic Panel: Recent Labs  Lab 07/02/21 1230  NA 138  K 3.4*  CL 102  CO2 26  GLUCOSE 123*  BUN 6  CREATININE 0.93  CALCIUM 9.1   Liver Function Tests: Recent Labs  Lab 07/02/21 1230  AST 577*  ALT 1,293*  ALKPHOS 252*  BILITOT 2.2*  PROT 7.6  ALBUMIN 4.1   Recent Labs  Lab 07/02/21 1230  LIPASE 148*   No results for input(s): AMMONIA in the last 168 hours. CBC: Recent Labs  Lab 07/02/21 1230  WBC 6.8  NEUTROABS 5.0  HGB 13.9  HCT 41.7  MCV 88.7  PLT 320   Cardiac Enzymes: No results for input(s): CKTOTAL, CKMB, CKMBINDEX, TROPONINI in the last 168 hours.  BNP (last 3 results) No results for input(s): BNP in the last 8760 hours.  ProBNP (last 3 results) No results for input(s): PROBNP in the last 8760 hours.  CBG: No results for input(s): GLUCAP in the last 168 hours.  Radiological Exams on Admission: CT ABDOMEN PELVIS W CONTRAST  Result Date: 07/02/2021 CLINICAL DATA:  Abdominal pain radiating to the back for 2 weeks, worse over the last 2 days. Some nausea vomiting. EXAM: CT ABDOMEN AND PELVIS WITH CONTRAST TECHNIQUE: Multidetector CT imaging of the abdomen and pelvis was performed using the standard protocol following bolus administration of intravenous contrast. RADIATION DOSE REDUCTION: This exam was performed according to the departmental dose-optimization program which includes automated exposure control, adjustment of the mA and/or kV according to patient size and/or use of iterative reconstruction technique. CONTRAST:  11m  OMNIPAQUE IOHEXOL 300 MG/ML  SOLN COMPARISON:  None Available. FINDINGS: Lower chest: Clear lung bases. Hepatobiliary: Liver normal in size. There are several subtle hypoattenuating liver lesions, not well-defined, most apparent at the lateral dome of segment 7, proximally 1.1 cm in size. No other liver abnormality. Gallbladder is distended, but without wall thickening or pericholecystic fluid. Common bile duct is mildly dilated, 8 mm proximally. Pancreas: There is a dystrophic appearing calcification in the upper pancreatic head surrounded by vague area of relative hypoattenuation, which may reflect a mass. The possible mass measures 2.6 cm. No other evidence of a pancreatic mass. The pancreatic duct distal to the possible mass is dilated to 4 mm. No pancreatic inflammation. Spleen: Normal in size without focal abnormality. Adrenals/Urinary Tract: Adrenal glands are unremarkable. Kidneys are normal, without renal calculi, focal lesion, or hydronephrosis. Bladder is unremarkable. Stomach/Bowel: Stomach is within normal limits. Appendix appears normal. No evidence of bowel wall thickening, distention, or inflammatory changes. Vascular/Lymphatic: Mildly enlarged retroperitoneal lymph nodes at the level of the upper pole the  right kidney, posterior to the inferior vena cava, 1.5 x 1.4 cm in short axis ease. Mildly prominent, but subcentimeter peripancreatic and celiac lymph nodes. No other adenopathy. No vascular abnormality. Portal vein, splenic vein and superior mesenteric vein are widely patent. Reproductive: Unremarkable. Other: No abdominal wall hernia or abnormality. No abdominopelvic ascites. Musculoskeletal: No fracture or acute finding.  No bone lesion. IMPRESSION: 1. Possible 2.6 cm pancreatic mass suspicious for neoplasm, associated with mild dilation of the pancreatic duct distal to the apparent mass, mild dilation of the common bile duct and 2 adjacent enlarged retroperitoneal lymph nodes. There are also  subtle low-attenuation lesions in the liver that are not well-defined, possibly metastatic disease. Recommend follow-up pancreatic MRI without and with contrast for further assessment. 2. No other abnormalities.  No acute findings. Electronically Signed   By: Lajean Manes M.D.   On: 07/02/2021 13:50    EKG: I independently viewed the EKG done and my findings are as followed: None extremity visit.  Ordered and pending.  Assessment/Plan Present on Admission:  Pancreatic mass  Principal Problem:   Pancreatic mass  Possible pancreatic mass seen on CT scan CT abdomen and pelvis with contrast revealed possible 2.6 cm pancreatic mass suspicious for neoplasm, associated with mild dilatation of the pancreatic duct distal to the apparent mass, mild dilatation of the common bile duct 8 mm and 2 adjacent enlarged retroperitoneal lymph nodes.  Also subtotal low-attenuation lesions in the liver that are not well-defined, possibly metastatic disease.  Recommended follow-up pancreatic MRI without and with contrast for further assessment.  MRI ordered 07/02/2021, results are pending. Analgesics as needed IV fluid LR KCl 40 mEq at 50 cc/h x 2-day. IV antiemetics as needed Clear liquid diet as tolerated N.p.o. after midnight GI will see in consultation on 07/03/2021.  Common bile duct dilatation with concern for choledocholithiasis or obstructive mass MRCP with and without contrast ordered, follow results No gallstone reported on CT scan radiology report CBD 8 mm N.p.o. after midnight for possible EUS or ERCP Rest of management per GI  Elevated liver chemistries CT scan findings as stated above. Repeat chemistry panel in the morning Rest of management per GI.  Hypokalemia Serum potassium 3.4 Repleted orally and intravenously P.o. KCl 40 mEq x 1 dose. LR KCl 40 mEq at 50 cc/h x 2 days.  Elevated lipase Lipase level 148 Repeat level in the morning  Severe morbid obesity BMI 44 Recommend weight  loss outpatient with regular physical activity and healthy dieting.  Constipation Last bowel movement was yesterday Bowel regimen in place.  Occasional alcohol use No evidence of alcohol withdrawal at time of the visit. States he drinks alcohol maybe once every 2 months   Critical care time: 65 minutes.   DVT prophylaxis: Subcu Lovenox daily  Code Status: Full code  Family Communication: Mother at bedside  Disposition Plan: Likely will discharge to home once GI sounds  Consults called: GI  Admission status: Inpatient status.   Status is: Inpatient Patient requires at least 2 midnights for further evaluation and treatment of present condition.   Kayleen Memos MD [facility] Pager [phone]  If 7PM-7AM, please contact night-coverage [email or web address] Password [facility]  07/02/2021, 5:21 PM

## 2021-07-02 NOTE — ED Provider Notes (Signed)
Leon Fischer Provider Note   CSN: [id number] Arrival date & time: 07/02/21  1155     History  Chief Complaint  Patient presents with   Abdominal Pain    Leon Fischer. is a 31 y.o. male.  31 year old male presents today for evaluation of abdominal pain that radiates into his back with associated vomiting, and constipation.  He states present for 2 weeks however significantly worse over the past 2 days.  Endorses using intermittent ibuprofen about twice a week, social alcohol drinker.  Denies history of pancreatitis.  Denies prior abdominal surgeries.  He states typically he has about 5 bowel movements per day but over the past week or so he has been constipated and has had missed a couple small bowel movements in this timeframe.  He has tried couple over-the-counter stool softeners without relief.  The history is provided by the patient. No language interpreter was used.      Home Medications Prior to Admission medications   Medication Sig Start Date End Date Taking? Authorizing Provider  albuterol (PROVENTIL) (2.5 MG/3ML) 0.083% nebulizer solution Take 3 mLs (2.5 mg total) by nebulization every 6 (six) hours as needed for wheezing or shortness of breath. 12/20/18   Robinson, [place] N, PA-C  albuterol (VENTOLIN HFA) 108 (90 Base) MCG/ACT inhaler Inhale 2 puffs into the lungs every 4 (four) hours as needed for wheezing or shortness of breath. 12/20/18   Robinson, [place] N, PA-C      Allergies    Shellfish allergy and Other    Review of Systems   Review of Systems  Constitutional:  Negative for chills and fever.  Respiratory:  Negative for shortness of breath.   Gastrointestinal:  Positive for abdominal pain, constipation and vomiting. Negative for blood in stool.  Genitourinary:  Negative for dysuria.  Neurological:  Negative for light-headedness.  All other systems reviewed and are negative.  Physical Exam Updated Vital Signs BP (!)  148/104 (BP Location: Right Arm)   Pulse 76   Temp 98.7 F (37.1 C) (Oral)   Resp 18   Ht 6' (1.829 m)   Wt (!) 156.5 kg   SpO2 100%   BMI 46.79 kg/m  Physical Exam Vitals and nursing note reviewed.  Constitutional:      General: He is not in acute distress.    Appearance: Normal appearance. He is not ill-appearing.  HENT:     Head: Normocephalic and atraumatic.     Nose: Nose normal.  Eyes:     General: No scleral icterus.    Extraocular Movements: Extraocular movements intact.     Conjunctiva/sclera: Conjunctivae normal.  Cardiovascular:     Rate and Rhythm: Normal rate and regular rhythm.     Pulses: Normal pulses.  Pulmonary:     Effort: Pulmonary effort is normal. No respiratory distress.     Breath sounds: Normal breath sounds. No wheezing or rales.  Abdominal:     General: There is no distension.     Tenderness: There is no abdominal tenderness. There is left CVA tenderness. There is no right CVA tenderness or guarding.  Musculoskeletal:        General: Normal range of motion.     Cervical back: Normal range of motion.  Skin:    General: Skin is warm and dry.  Neurological:     General: No focal deficit present.     Mental Status: He is alert. Mental status is at baseline.    ED Results /  Procedures / Treatments   Labs (all labs ordered are listed, but only abnormal results are displayed) Labs Reviewed  CBC WITH DIFFERENTIAL/PLATELET  LIPASE, BLOOD  COMPREHENSIVE METABOLIC PANEL  URINALYSIS, ROUTINE W REFLEX MICROSCOPIC    EKG None  Radiology No results found.  Procedures Procedures    Medications Ordered in ED Medications  ondansetron (ZOFRAN) injection 4 mg (has no administration in time range)  ketorolac (TORADOL) 15 MG/ML injection 15 mg (has no administration in time range)    ED Course/ Medical Decision Making/ A&P Clinical Course as of 07/02/21 1528  Mon Jul 02, 2021  1400 Patient CBC without leukocytosis or anemia.  CMP shows  transaminitis, elevated alk phos, elevated total bilirubin, and elevated lipase.  UA without evidence of UTI.  CT abdomen pelvis with concern for pancreatic mass, and metastatic process.  Will discuss with oncology. [AA]  1517 Anion gap: 10 [AA]    Clinical Course User Index [AA] Evlyn Courier, PA-C                           Medical Decision Making Amount and/or Complexity of Data Reviewed Labs: ordered. Decision-making details documented in ED Course. Radiology: ordered.  Risk Prescription drug management. Decision regarding hospitalization.   Medical Decision Making / ED Course   This patient presents to the ED for concern of abdominal pain, this involves an extensive number of treatment options, and is a complaint that carries with it a high risk of complications and morbidity.  The differential diagnosis includes pancreatitis, constipation, cholecystitis, appendicitis, gastroenteritis  MDM: 31 year old male presents for evaluation of abdominal pain that radiates into his back.  Endorses constipation, and emesis.  Symptoms worse over the past 2 days.  CBC without leukocytosis or anemia.  Lipase elevated at 148.  UA without UTI.  CMP with evidence of hepatitis with AST of 577, ALT of 1293, obstructive pattern with elevated alk phos at 252, T. bili of 2.2.  Patient without evidence of jaundice on exam.  Discussed with her oncologist Dr. Irene Limbo who recommends admission to medicine service and consult gastroenterology for ERCP and potential stent placement, and EUS.  Will consult hospitalist and discussed with them.  Discussed with hospitalist will accept patient for admission.  They request that I make a phone call to put patient on gastroenterology's radar while patient is awaiting transport. Discussed with gastroenterology (Dr. Alessandra Bevels).  Discussed with him the recommendations from oncology.  Lab Tests: -I ordered, reviewed, and interpreted labs.   The pertinent results include:   Labs  Reviewed  LIPASE, BLOOD - Abnormal; Notable for the following components:      Result Value   Lipase 148 (*)    All other components within normal limits  COMPREHENSIVE METABOLIC PANEL - Abnormal; Notable for the following components:   Potassium 3.4 (*)    Glucose, Bld 123 (*)    AST 577 (*)    ALT 1,293 (*)    Alkaline Phosphatase 252 (*)    Total Bilirubin 2.2 (*)    All other components within normal limits  URINALYSIS, ROUTINE W REFLEX MICROSCOPIC - Abnormal; Notable for the following components:   Color, Urine AMBER (*)    Hgb urine dipstick TRACE (*)    Bilirubin Urine SMALL (*)    All other components within normal limits  URINALYSIS, MICROSCOPIC (REFLEX) - Abnormal; Notable for the following components:   Bacteria, UA RARE (*)    All other components within normal  limits  CBC WITH DIFFERENTIAL/PLATELET      EKG  EKG Interpretation  Date/Time:    Ventricular Rate:    PR Interval:    QRS Duration:   QT Interval:    QTC Calculation:   R Axis:     Text Interpretation:           Imaging Studies ordered: I ordered imaging studies including CT abdomen pelvis with contrast I independently visualized and interpreted imaging. I agree with the radiologist interpretation   Medicines ordered and prescription drug management: Meds ordered this encounter  Medications   ondansetron (ZOFRAN) injection 4 mg   ketorolac (TORADOL) 15 MG/ML injection 15 mg   iohexol (OMNIPAQUE) 300 MG/ML solution 100 mL    -I have reviewed the patients home medicines and have made adjustments as needed  Consultations Obtained: I requested consultation with the oncology,  and discussed lab and imaging findings as well as pertinent plan - they recommend: Medicine admission.  GI consult for ESR P, potential stent, and biopsy.  Reevaluation: After the interventions noted above, I reevaluated the patient and found that they have :stayed the same Remained without any significant pain, or  emesis in the emergency room.  Co morbidities that complicate the patient evaluation  Past Medical History:  Diagnosis Date   Asthma       Dispostion: Discussed with hospitalist will accept patient for admission.  Final Clinical Impression(s) / ED Diagnoses Final diagnoses:  Pancreatic mass  Acute pancreatitis, unspecified complication status, unspecified pancreatitis type  Transaminitis  Hyperbilirubinemia    Rx / DC Orders ED Discharge Orders     None         Evlyn Courier, PA-C 07/02/21 [place], [place], DO 07/04/21 [phone]

## 2021-07-02 NOTE — ED Notes (Signed)
Pt to CT

## 2021-07-02 NOTE — ED Notes (Signed)
Phone Handoff Report given to [facility] Transport Team 

## 2021-07-02 NOTE — ED Triage Notes (Signed)
C/o abd pain with referring pain to his back, onset approx 2 weeks with the pain worsening in the past 2 days. Having some nausea and vomiting, last vomited approx 30 min, denies any diarrhea

## 2021-07-02 NOTE — Plan of Care (Signed)
31 year old male with history of asthma, depression and morbid obesity presenting with abdominal pain radiating to his back and constipation for about 2 weeks that has gotten worse over the last 2 days, and found to have acute hepatitis in the setting of pancreatic mass with mild CBD and pancreatic duct dilation.  Oncology consulted by ED PA.  I have requested ED PA to consult GI since patient might need EUS/ERCP/possible stent.  Accepted admission to [facility] floor.  Patient is hemodynamically stable.

## 2021-07-03 ENCOUNTER — Other Ambulatory Visit: Payer: Self-pay | Admitting: Gastroenterology

## 2021-07-03 ENCOUNTER — Encounter (HOSPITAL_COMMUNITY): Payer: Self-pay | Admitting: Student

## 2021-07-03 ENCOUNTER — Inpatient Hospital Stay (HOSPITAL_COMMUNITY): Payer: 59

## 2021-07-03 DIAGNOSIS — R112 Nausea with vomiting, unspecified: Secondary | ICD-10-CM

## 2021-07-03 DIAGNOSIS — C787 Secondary malignant neoplasm of liver and intrahepatic bile duct: Secondary | ICD-10-CM | POA: Insufficient documentation

## 2021-07-03 DIAGNOSIS — M549 Dorsalgia, unspecified: Secondary | ICD-10-CM

## 2021-07-03 DIAGNOSIS — Z79899 Other long term (current) drug therapy: Secondary | ICD-10-CM

## 2021-07-03 DIAGNOSIS — R932 Abnormal findings on diagnostic imaging of liver and biliary tract: Secondary | ICD-10-CM

## 2021-07-03 DIAGNOSIS — K5900 Constipation, unspecified: Secondary | ICD-10-CM

## 2021-07-03 DIAGNOSIS — R109 Unspecified abdominal pain: Secondary | ICD-10-CM

## 2021-07-03 DIAGNOSIS — F17210 Nicotine dependence, cigarettes, uncomplicated: Secondary | ICD-10-CM

## 2021-07-03 DIAGNOSIS — R7401 Elevation of levels of liver transaminase levels: Secondary | ICD-10-CM

## 2021-07-03 LAB — COMPREHENSIVE METABOLIC PANEL
ALT: 1334 U/L — ABNORMAL HIGH (ref 0–44)
AST: 527 U/L — ABNORMAL HIGH (ref 15–41)
Albumin: 4.2 g/dL (ref 3.5–5.0)
Alkaline Phosphatase: 259 U/L — ABNORMAL HIGH (ref 38–126)
Anion gap: 8 (ref 5–15)
BUN: 5 mg/dL — ABNORMAL LOW (ref 6–20)
CO2: 24 mmol/L (ref 22–32)
Calcium: 9.4 mg/dL (ref 8.9–10.3)
Chloride: 108 mmol/L (ref 98–111)
Creatinine, Ser: 0.76 mg/dL (ref 0.61–1.24)
GFR, Estimated: >60 mL/min (ref >60)
Glucose, Bld: 96 mg/dL (ref 70–99)
Potassium: 4.1 mmol/L (ref 3.5–5.1)
Sodium: 140 mmol/L (ref 135–145)
Total Bilirubin: 2.2 mg/dL — ABNORMAL HIGH (ref 0.3–1.2)
Total Protein: 7.8 g/dL (ref 6.5–8.1)

## 2021-07-03 LAB — CBC WITH DIFFERENTIAL/PLATELET
Abs Immature Granulocytes: 0.01 10*3/uL (ref 0.00–0.07)
Basophils Absolute: 0 10*3/uL (ref 0.0–0.1)
Basophils Relative: 0 %
Eosinophils Absolute: 0.2 10*3/uL (ref 0.0–0.5)
Eosinophils Relative: 3 %
HCT: 44 % (ref 39.0–52.0)
Hemoglobin: 14.6 g/dL (ref 13.0–17.0)
Immature Granulocytes: 0 %
Lymphocytes Relative: 20 %
Lymphs Abs: 1.3 10*3/uL (ref 0.7–4.0)
MCH: 30.1 pg (ref 26.0–34.0)
MCHC: 33.2 g/dL (ref 30.0–36.0)
MCV: 90.7 fL (ref 80.0–100.0)
Monocytes Absolute: 0.6 10*3/uL (ref 0.1–1.0)
Monocytes Relative: 9 %
Neutro Abs: 4.4 10*3/uL (ref 1.7–7.7)
Neutrophils Relative %: 68 %
Platelets: 279 10*3/uL (ref 150–400)
RBC: 4.85 MIL/uL (ref 4.22–5.81)
RDW: 13.9 % (ref 11.5–15.5)
WBC: 6.5 10*3/uL (ref 4.0–10.5)
nRBC: 0 % (ref 0.0–0.2)

## 2021-07-03 LAB — HEPATITIS PANEL, ACUTE
HCV Ab: NONREACTIVE
Hep A IgM: NONREACTIVE
Hep B C IgM: NONREACTIVE
Hepatitis B Surface Ag: NONREACTIVE

## 2021-07-03 LAB — MAGNESIUM: Magnesium: 2.3 mg/dL (ref 1.7–2.4)

## 2021-07-03 LAB — HIV ANTIBODY (ROUTINE TESTING W REFLEX): HIV Screen 4th Generation wRfx: NONREACTIVE

## 2021-07-03 LAB — LIPASE, BLOOD: Lipase: 115 U/L — ABNORMAL HIGH (ref 11–51)

## 2021-07-03 LAB — PHOSPHORUS: Phosphorus: 4.1 mg/dL (ref 2.5–4.6)

## 2021-07-03 IMAGING — CT CT CHEST W/ CM
2 of 4 series · 14 of 36 positions shown, 17 images · IV contrast (OMNIPAQUE)
Comparison: None Available.

CLINICAL DATA: Pancreatic carcinoma staging

EXAM:
CT CHEST WITH CONTRAST
TECHNIQUE: Multidetector CT imaging of the chest was performed during
intravenous contrast administration.

[Series 2: axial st · axial · 0.80mm/px · z∈[-195,+93]mm · 11 of 168 slices shown, 14 images]
[im 12/168  mediastinal]
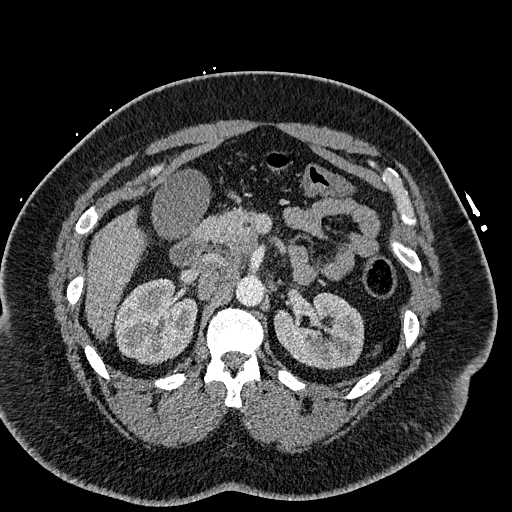
[im 12/168  lung]
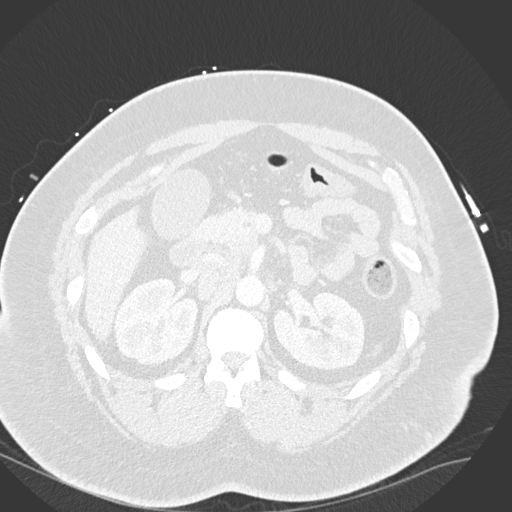
[im 23/168  lung]
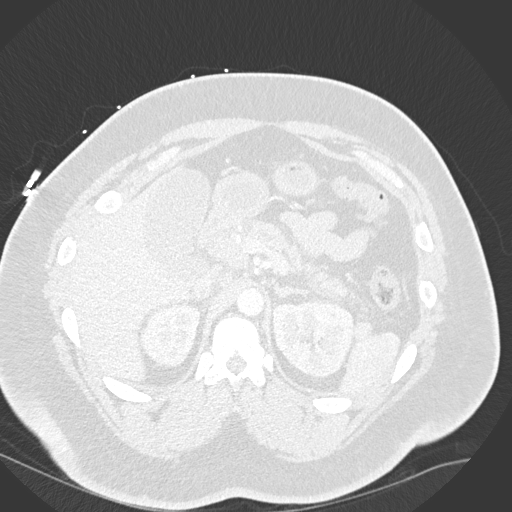
[im 45/168  lung]
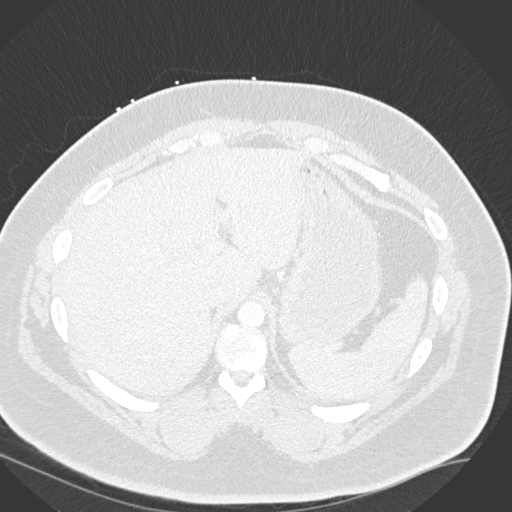
[im 56/168  lung]
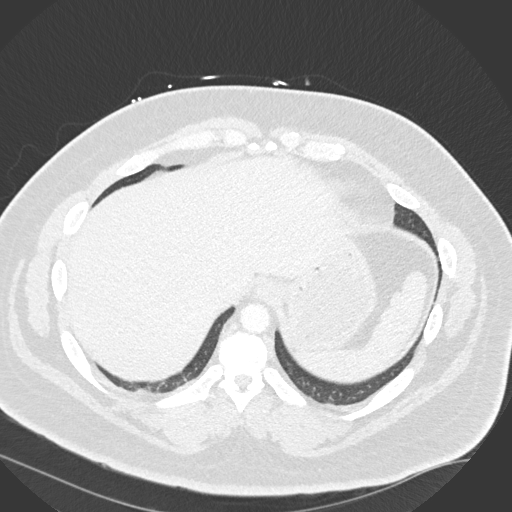
[im 67/168  mediastinal]
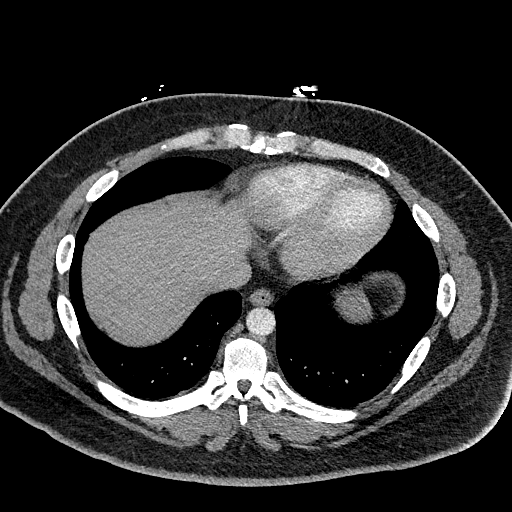
[im 67/168  lung]
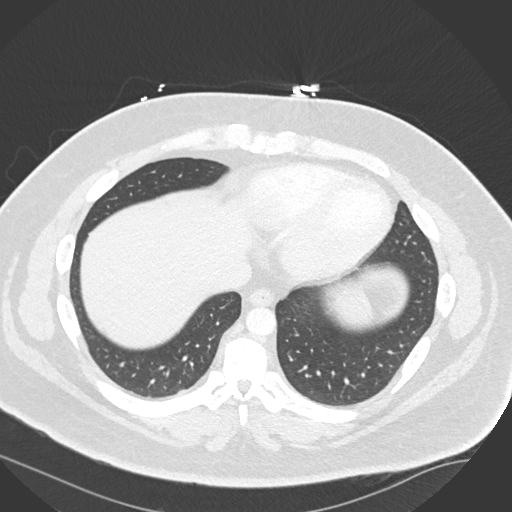
[im 90/168  lung]
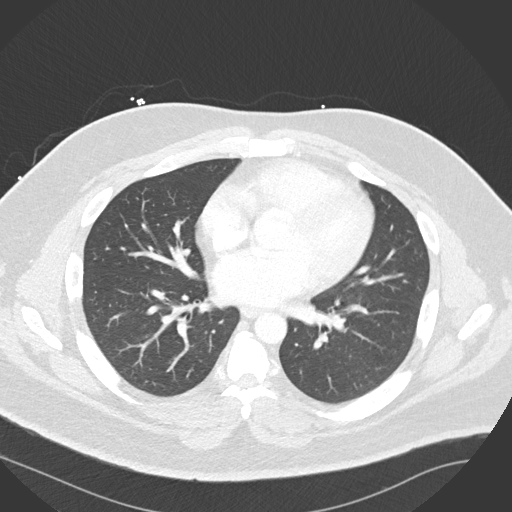
[im 101/168  lung]
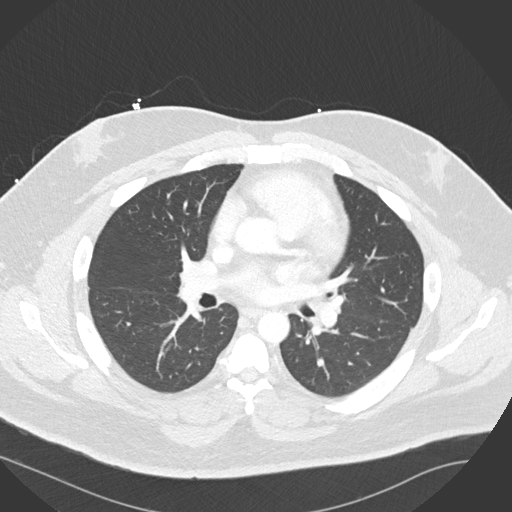
[im 112/168  lung]
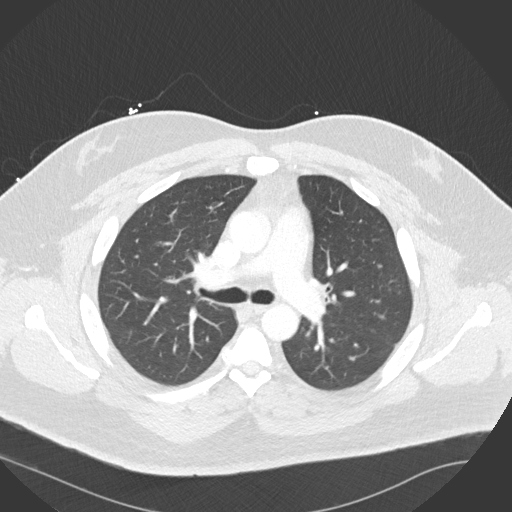
[im 123/168  mediastinal]
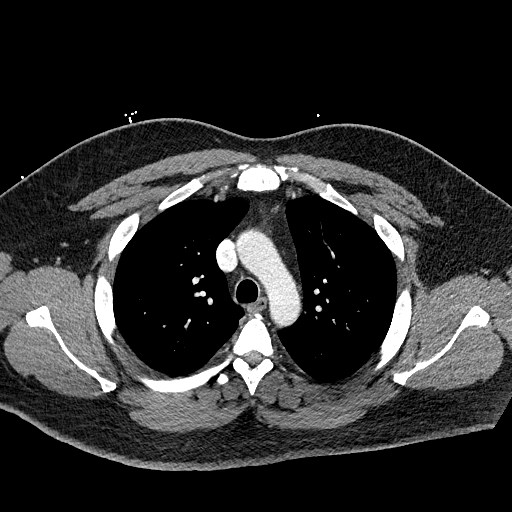
[im 123/168  lung]
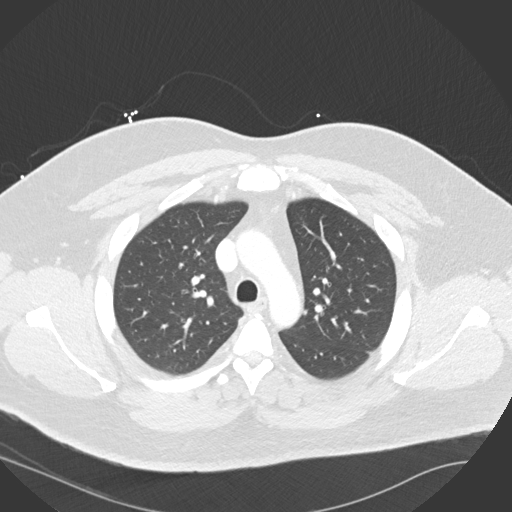
[im 145/168  lung]
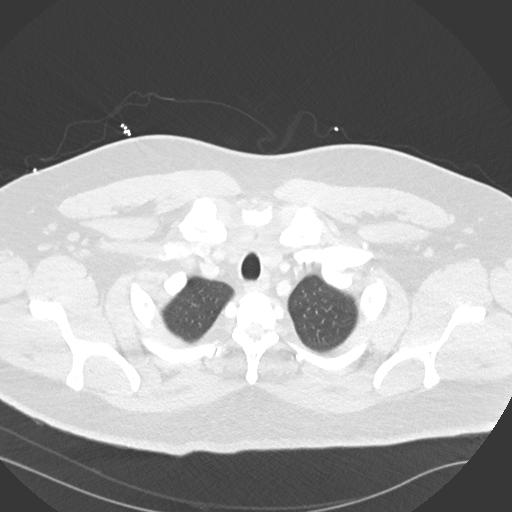
[im 156/168  lung]
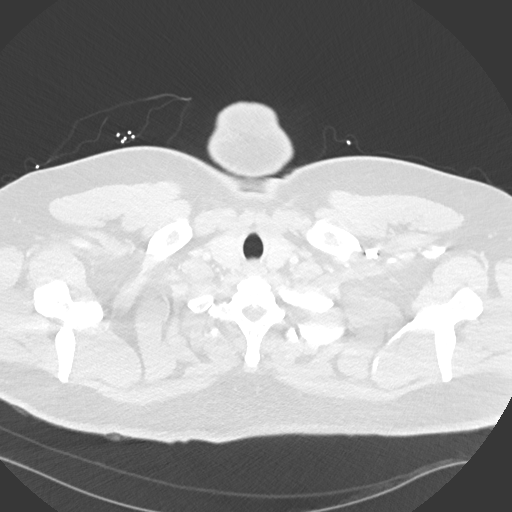

[Series 6: coronal · coronal · 0.70mm/px · 3 of 123 slices shown]
[im 25/123  lung]
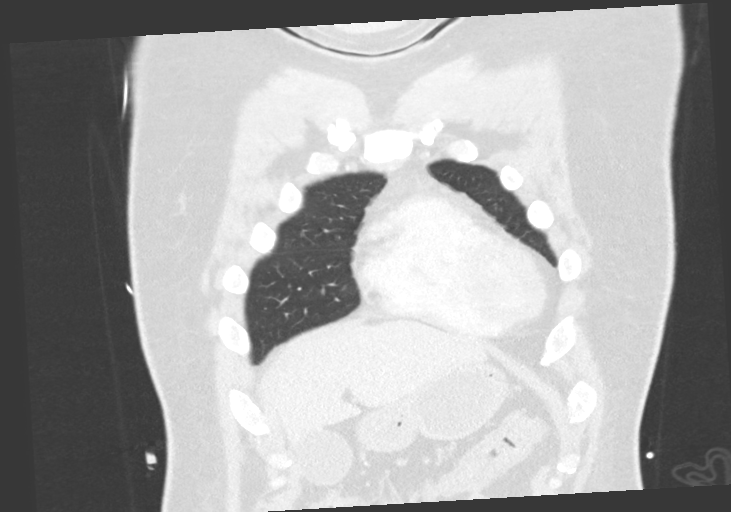
[im 49/123  lung]
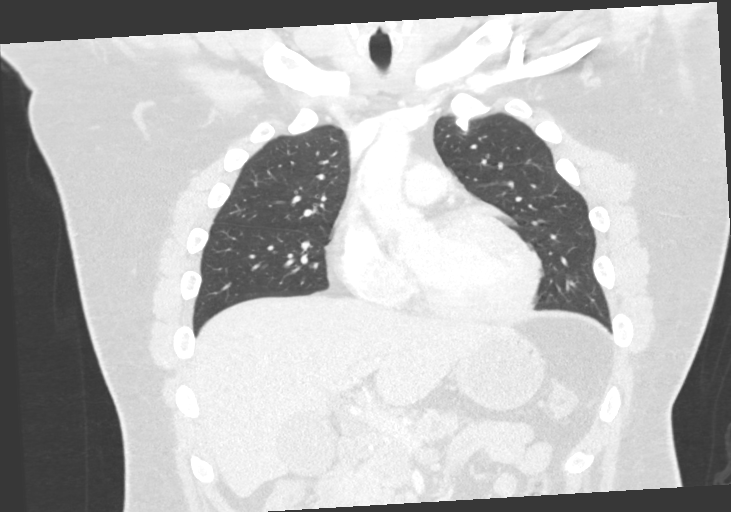
[im 74/123  lung]
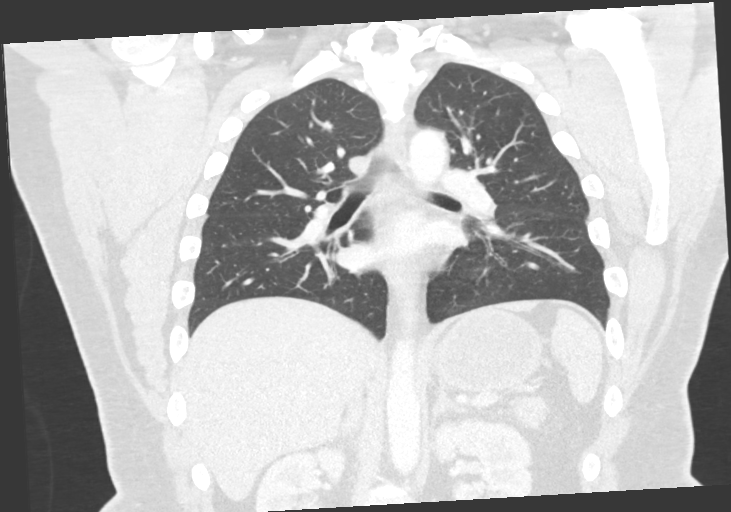

[14 of 36 positions shown; findings below may reference images not displayed]

RADIATION DOSE REDUCTION: This exam was performed according to the
departmental dose-optimization program which includes automated
exposure control, adjustment of the mA and/or kV according to
patient size and/or use of iterative reconstruction technique.

CONTRAST:  75mL OMNIPAQUE IOHEXOL 300 MG/ML  SOLN
FINDINGS: Cardiovascular: There is homogeneous enhancement in thoracic aorta.
There are no filling defects in the central pulmonary artery
branches.

Mediastinum/Nodes: No significant lymphadenopathy is seen.

Lungs/Pleura: There is no focal pulmonary consolidation. There are
no discrete lung nodules. Small linear densities in the right
posterior costophrenic angle may suggest minimal scarring or
subsegmental atelectasis. There is no pleural effusion or
pneumothorax.

Upper Abdomen: There is 2.6 cm ill-defined low-attenuation lesion at
the junction of head and body of pancreas. There is dilation of
pancreatic duct in the body and tail. There is no dilation of bile
ducts. Gallbladder is distended. There is no wall thickening in
gallbladder. Small space-occupying lesions seen in the liver in the
previous MRI could not be distinctly identified in the CT images.

Musculoskeletal: There are no focal lytic or sclerotic lesions.
Degenerative changes are noted in the thoracic spine. Gynecomastia
is seen in both breasts.
IMPRESSION: No significant abnormality is seen in the CT scan of chest. There is
no significant lymphadenopathy. There are no discrete lung nodules.

Low-density lesion seen in the pancreas is better evaluated in the
MRI examination.

## 2021-07-03 MED ORDER — IOHEXOL 300 MG/ML  SOLN
100.0000 mL | Freq: Once | INTRAMUSCULAR | Status: AC | PRN
  Administered 2021-07-03: 75 mL via INTRAVENOUS

## 2021-07-03 MED ORDER — SODIUM CHLORIDE (PF) 0.9 % IJ SOLN
INTRAMUSCULAR | Status: AC
  Filled 2021-07-03: qty 50

## 2021-07-03 NOTE — Progress Notes (Signed)
Initial Nutrition Assessment  DOCUMENTATION CODES:   Morbid obesity  INTERVENTION:   -Ensure MAX Protein po BID, each supplement provides 150 kcal and 30 grams of protein   -Multivitamin with minerals daily  NUTRITION DIAGNOSIS:   Inadequate oral intake related to nausea, vomiting as evidenced by per patient/family report.  GOAL:   Patient will meet greater than or equal to 90% of their needs  MONITOR:   PO intake, Supplement acceptance, Weight trends, Labs, I & O's  REASON FOR ASSESSMENT:   Malnutrition Screening Tool    ASSESSMENT:   31 y.o. male medical history significant for asthma, severe morbid obesity, presents for evaluation of pancreatic mass.  Patient in room, family at bedside. Pt reports he has been unable to keep any food down for about a week now. History of constipation. Prior to this he normally consumes 2 meals a day, his mom cooks him balanced meals. Pt denies any issues with chewing or swallowing.  Currently NPO, waiting to see if he will have procedure today. Per GI, recommending EUS for biopsy.   Pt agreeable to  receiving protein supplements once diet is advanced given poor PO x 1 week now.   Pt denies weight changes.  Medications: Senokot  Labs reviewed.  NUTRITION - FOCUSED PHYSICAL EXAM:  No depletions noted.  Diet Order:   Diet Order             DIET SOFT Room service appropriate? Yes; Fluid consistency: Thin  Diet effective now                   EDUCATION NEEDS:   Education needs have been addressed  Skin:  Skin Assessment: Reviewed RN Assessment  Last BM:  5/28  Height:   Ht Readings from Last 1 Encounters:  07/02/21 6' (1.829 m)    Weight:   Wt Readings from Last 1 Encounters:  07/02/21 (!) 147.7 kg    BMI:  Body mass index is 44.16 kg/m.  Estimated Nutritional Needs:   Kcal:  2100-2300  Protein:  110-120g  Fluid:  2.1L/day  Clayton Bibles, MS, RD, LDN Inpatient Clinical Dietitian Contact  information available via Amion

## 2021-07-03 NOTE — Progress Notes (Signed)
[facility] PROGRESS NOTE   Leon Fischer. POE:[id number] DOB: Jun 26, 1990 DOA: 07/02/2021  PCP: Patient, No Pcp Per (Inactive)  Brief History/Interval Summary: 31 y.o. male with medical history significant for asthma, severe morbid obesity, who initially presented to [facility] ED with complaints of worsening intermittent diffuse abdominal pain, worse at epigastric region and radiating to his back.  He was found to have abnormal liver function test.  CT scan of the abdomen pelvis revealed a pancreatic mass.  Due to significant pain patient was hospitalized.  He was also experiencing nausea and vomiting.  Gastroenterology was consulted.   Consultants: Gastroenterology  Procedures: None yet    Subjective/Interval History: Patient experiencing pain in the upper abdomen, 5 out of 10 in intensity.  Some nausea but no vomiting since yesterday.  No chest pain or shortness of breath.    Assessment/Plan:  Pancreatic mass with biliary ductal dilatation Noted initially on CT scan.  MRI confirmed this finding. Found to have a 3.1 x 2.3 cm mass in the pancreatic head.  Multiple small liver lesions were noted which were suspicious for metastases.  Upper abdominal and retroperitoneal lymphadenopathy was also noted. Gastroenterology has been consulted.  Patient will need tissue diagnosis.  We will also check a CA 19-9.  Elevated lipase level was noted.  Abnormal LFTs AST and ALT noted to be significantly elevated with mildly elevated bilirubin and alkaline phosphatase. Mildly dilated biliary duct as well as pancreatic duct was noted on imaging studies. Abnormal LFTs most likely due to liver lesions noted on MRI.  Hypokalemia Repleted.  Magnesium 2.3.  Constipation Bowel regimen initiated.  Occasional alcohol use Very low risk for withdrawal.  Class III obesity Estimated body mass index is 44.16 kg/m as calculated from the following:   Height as of this encounter: 6' (1.829  m).   Weight as of this encounter: 147.7 kg.   DVT Prophylaxis: Lovenox Code Status: Full code Family Communication: Discussed with patient and his mother Disposition Plan: Hopefully return home when work-up is complete  Status is: Inpatient Remains inpatient appropriate because: Pancreatic mass with abdominal pain nausea and vomiting    Medications: Scheduled:  enoxaparin (LOVENOX) injection  40 mg Subcutaneous Q24H   feeding supplement  1 Container Oral TID BM   senna-docusate  2 tablet Oral BID   Continuous:  lactated ringers with kcl 50 mL/hr at 07/02/21 1817   RXV:[id number], HYDROmorphone (DILAUDID) injection, melatonin, oxyCODONE, polyethylene glycol, prochlorperazine  Antibiotics: Anti-infectives (From admission, onward)    None       Objective:  Vital Signs  Vitals:   07/02/21 1659 07/02/21 2052 07/03/21 0042 07/03/21 0452  BP: (!) 155/95 134/79 (!) 131/92 135/85  Pulse: 64 66 78 72  Resp: '18 18 20 18  '$ Temp: 98 F (36.7 C) 98 F (36.7 C) 98.2 F (36.8 C) (!) 97.5 F (36.4 C)  TempSrc: Oral   Oral  SpO2: 100% 100% 99% 97%  Weight: (!) 147.7 kg     Height: 6' (1.829 m)       Intake/Output Summary (Last 24 hours) at 07/03/2021 0919 Last data filed at 07/03/2021 0700 Gross per 24 hour  Intake 0 ml  Output --  Net 0 ml   Filed Weights   07/02/21 1204 07/02/21 1659  Weight: (!) 156.5 kg (!) 147.7 kg    General appearance: Awake alert.  In no distress Resp: Clear to auscultation bilaterally.  Normal effort Cardio: S1-S2 is normal regular.  No S3-S4.  No rubs  murmurs or bruit GI: Abdomen is soft.  Tender in the epigastric area with fullness.  No rebound rigidity or guarding.  No obvious masses organomegaly appreciated.  Bowel sounds sluggish. Extremities: No edema.  Full range of motion of lower extremities. Neurologic: Alert and oriented x3.  No focal neurological deficits.    Lab Results:  Data Reviewed: I have personally reviewed following  labs and reports of the imaging studies  CBC: Recent Labs  Lab 07/02/21 1230 07/03/21 0534  WBC 6.8 6.5  NEUTROABS 5.0 4.4  HGB 13.9 14.6  HCT 41.7 44.0  MCV 88.7 90.7  PLT 320 426    Basic Metabolic Panel: Recent Labs  Lab 07/02/21 1230 07/03/21 0534  NA 138 140  K 3.4* 4.1  CL 102 108  CO2 26 24  GLUCOSE 123* 96  BUN 6 5*  CREATININE 0.93 0.76  CALCIUM 9.1 9.4  MG  --  2.3  PHOS  --  4.1    GFR: Estimated Creatinine Clearance: 201.7 mL/min (by C-G formula based on SCr of 0.76 mg/dL).  Liver Function Tests: Recent Labs  Lab 07/02/21 1230 07/03/21 0534  AST 577* 527*  ALT 1,293* 1,334*  ALKPHOS 252* 259*  BILITOT 2.2* 2.2*  PROT 7.6 7.8  ALBUMIN 4.1 4.2    Recent Labs  Lab 07/02/21 1230 07/03/21 0534  LIPASE 148* 115*     Radiology Studies: CT ABDOMEN PELVIS W CONTRAST  Result Date: 07/02/2021 CLINICAL DATA:  Abdominal pain radiating to the back for 2 weeks, worse over the last 2 days. Some nausea vomiting. EXAM: CT ABDOMEN AND PELVIS WITH CONTRAST TECHNIQUE: Multidetector CT imaging of the abdomen and pelvis was performed using the standard protocol following bolus administration of intravenous contrast. RADIATION DOSE REDUCTION: This exam was performed according to the departmental dose-optimization program which includes automated exposure control, adjustment of the mA and/or kV according to patient size and/or use of iterative reconstruction technique. CONTRAST:  121m OMNIPAQUE IOHEXOL 300 MG/ML  SOLN COMPARISON:  None Available. FINDINGS: Lower chest: Clear lung bases. Hepatobiliary: Liver normal in size. There are several subtle hypoattenuating liver lesions, not well-defined, most apparent at the lateral dome of segment 7, proximally 1.1 cm in size. No other liver abnormality. Gallbladder is distended, but without wall thickening or pericholecystic fluid. Common bile duct is mildly dilated, 8 mm proximally. Pancreas: There is a dystrophic appearing  calcification in the upper pancreatic head surrounded by vague area of relative hypoattenuation, which may reflect a mass. The possible mass measures 2.6 cm. No other evidence of a pancreatic mass. The pancreatic duct distal to the possible mass is dilated to 4 mm. No pancreatic inflammation. Spleen: Normal in size without focal abnormality. Adrenals/Urinary Tract: Adrenal glands are unremarkable. Kidneys are normal, without renal calculi, focal lesion, or hydronephrosis. Bladder is unremarkable. Stomach/Bowel: Stomach is within normal limits. Appendix appears normal. No evidence of bowel wall thickening, distention, or inflammatory changes. Vascular/Lymphatic: Mildly enlarged retroperitoneal lymph nodes at the level of the upper pole the right kidney, posterior to the inferior vena cava, 1.5 x 1.4 cm in short axis ease. Mildly prominent, but subcentimeter peripancreatic and celiac lymph nodes. No other adenopathy. No vascular abnormality. Portal vein, splenic vein and superior mesenteric vein are widely patent. Reproductive: Unremarkable. Other: No abdominal wall hernia or abnormality. No abdominopelvic ascites. Musculoskeletal: No fracture or acute finding.  No bone lesion. IMPRESSION: 1. Possible 2.6 cm pancreatic mass suspicious for neoplasm, associated with mild dilation of the pancreatic duct distal to the  apparent mass, mild dilation of the common bile duct and 2 adjacent enlarged retroperitoneal lymph nodes. There are also subtle low-attenuation lesions in the liver that are not well-defined, possibly metastatic disease. Recommend follow-up pancreatic MRI without and with contrast for further assessment. 2. No other abnormalities.  No acute findings. Electronically Signed   By: Lajean Manes M.D.   On: 07/02/2021 13:50   MR 3D Recon At Scanner  Result Date: 07/02/2021 CLINICAL DATA:  Abdominal pain, nausea/vomiting, possible pancreatic mass on CT EXAM: MRI ABDOMEN WITHOUT AND WITH CONTRAST (INCLUDING  MRCP) TECHNIQUE: Multiplanar multisequence MR imaging of the abdomen was performed both before and after the administration of intravenous contrast. Heavily T2-weighted images of the biliary and pancreatic ducts were obtained, and three-dimensional MRCP images were rendered by post processing. CONTRAST:  50m GADAVIST GADOBUTROL 1 MMOL/ML IV SOLN COMPARISON:  CT abdomen/pelvis dated 07/02/2021 FINDINGS: Motion degraded images. Lower chest: Lung bases are clear. Hepatobiliary: Two subtle liver lesions, measuring 11 mm in segment 8 (series 4/image 9) and 12 mm in segment 5 along the gallbladder fossa (series 4/image 21). These are poorly evaluated due to motion degradation, but enhancement is favored at least within the segment 5 lesion on postcontrast subtraction imaging (series 31/image 62). Associated restricted diffusion. Three additional smaller lesions are present on diffusion imaging (series 8/images 55 and 63). These findings are concerning for metastatic disease. Gallbladder is unremarkable. No cholelithiasis or inflammatory changes. No intrahepatic or extrahepatic ductal dilatation. No choledocholithiasis is evident. Pancreas: 3.1 x 2.3 cm hypoenhancing mass along the posteromedial aspect of the pancreatic head (series 22/image 67), suspicious for pancreatic neoplasm. Associated mild dilatation of the main pancreatic duct (series 4/image 20), which abruptly ends at the level of the suspected mass (series 4/image 23). No pancreatic atrophy. Spleen:  Within normal limits. Adrenals/Urinary Tract:  Adrenal glands are within normal limits. Kidneys are within normal limits.  No hydronephrosis. Stomach/Bowel: Stomach is within normal limits. Visualized bowel is unremarkable. Vascular/Lymphatic:  No evidence of abdominal aortic aneurysm. Celiac artery, SMA, and portal vein appear distinct from the mass, without definite vascular invasion. Upper abdominal lymphadenopathy, including a 16 mm short axis portacaval node  (series 4/image 21) and a 17 mm short axis right retrocaval node at the level of the right renal vein. Other:  No abdominal ascites. Musculoskeletal: No focal osseous lesions. IMPRESSION: 3.1 x 2.3 cm hypoenhancing mass along the posteromedial aspect of the pancreatic head, suspicious for pancreatic neoplasm. EUS is suggested. Multiple small liver lesions, suspicious for hepatic metastases. Upper abdominal/retroperitoneal lymphadenopathy, suspicious for nodal metastases. Electronically Signed   By: SJulian HyM.D.   On: 07/02/2021 20:16   MR ABDOMEN MRCP W WO CONTAST  Result Date: 07/02/2021 CLINICAL DATA:  Abdominal pain, nausea/vomiting, possible pancreatic mass on CT EXAM: MRI ABDOMEN WITHOUT AND WITH CONTRAST (INCLUDING MRCP) TECHNIQUE: Multiplanar multisequence MR imaging of the abdomen was performed both before and after the administration of intravenous contrast. Heavily T2-weighted images of the biliary and pancreatic ducts were obtained, and three-dimensional MRCP images were rendered by post processing. CONTRAST:  159mGADAVIST GADOBUTROL 1 MMOL/ML IV SOLN COMPARISON:  CT abdomen/pelvis dated 07/02/2021 FINDINGS: Motion degraded images. Lower chest: Lung bases are clear. Hepatobiliary: Two subtle liver lesions, measuring 11 mm in segment 8 (series 4/image 9) and 12 mm in segment 5 along the gallbladder fossa (series 4/image 21). These are poorly evaluated due to motion degradation, but enhancement is favored at least within the segment 5 lesion on postcontrast subtraction imaging (series  31/image 62). Associated restricted diffusion. Three additional smaller lesions are present on diffusion imaging (series 8/images 55 and 63). These findings are concerning for metastatic disease. Gallbladder is unremarkable. No cholelithiasis or inflammatory changes. No intrahepatic or extrahepatic ductal dilatation. No choledocholithiasis is evident. Pancreas: 3.1 x 2.3 cm hypoenhancing mass along the  posteromedial aspect of the pancreatic head (series 22/image 67), suspicious for pancreatic neoplasm. Associated mild dilatation of the main pancreatic duct (series 4/image 20), which abruptly ends at the level of the suspected mass (series 4/image 23). No pancreatic atrophy. Spleen:  Within normal limits. Adrenals/Urinary Tract:  Adrenal glands are within normal limits. Kidneys are within normal limits.  No hydronephrosis. Stomach/Bowel: Stomach is within normal limits. Visualized bowel is unremarkable. Vascular/Lymphatic:  No evidence of abdominal aortic aneurysm. Celiac artery, SMA, and portal vein appear distinct from the mass, without definite vascular invasion. Upper abdominal lymphadenopathy, including a 16 mm short axis portacaval node (series 4/image 21) and a 17 mm short axis right retrocaval node at the level of the right renal vein. Other:  No abdominal ascites. Musculoskeletal: No focal osseous lesions. IMPRESSION: 3.1 x 2.3 cm hypoenhancing mass along the posteromedial aspect of the pancreatic head, suspicious for pancreatic neoplasm. EUS is suggested. Multiple small liver lesions, suspicious for hepatic metastases. Upper abdominal/retroperitoneal lymphadenopathy, suspicious for nodal metastases. Electronically Signed   By: Julian Hy M.D.   On: 07/02/2021 20:16       LOS: 1 day   [facility] Pager on [email or web address]  07/03/2021, 9:19 AM

## 2021-07-03 NOTE — Consult Note (Addendum)
Referring Provider: [facility] Primary Care Physician:  Patient, No Pcp Per (Inactive) Primary Gastroenterologist:  Althia Forts  Reason for Consultation:  Pancreatic mass  HPI: Leon Fischer. is a 31 y.o. male medical history significant for asthma, severe morbid obesity, presents for evaluation of pancreatic mass.  Patient presented to ED with worsening intermittent epigastric pain radiating to his back.  Poor oral intake, nausea, vomiting, and constipation.  Lab work-up showed elevated lipase at 148, T. bili 2.2.  CT abdomen pelvis with contrast revealed possible 2.6 cm pancreatic mass suspicious for neoplasm, associated with mild dilation of the pancreatic duct distal to the apparent mass, mild dilation of the common bile duct 8 mm and 2 adjacent enlarged retroperitoneal lymph nodes.  Also subtotal low-attenuation lesions in the liver that are not well-defined, possibly metastatic disease. MRCP showed 3.1 x 2.3 cm hypoenhancing mass along the posteromedial aspect of the pancreatic head as well as liver lesions and abdominal lymphadenopathy.  Patient states he had not had a normal bowel movements for 2 weeks. Had a small bowel movement on Sunday. Began having the epigastric pain which brought him to ED for evaluation. Denies NSAID use. Denies tobacco use. Social alcohol use. Denies unintentional weight loss Family history of UC in Uncle, otherwise no significant family GI history.  Past Medical History:  Diagnosis Date   Asthma     History reviewed. No pertinent surgical history.  Prior to Admission medications   Medication Sig Start Date End Date Taking? Authorizing Provider  ibuprofen (ADVIL) 200 MG tablet Take 200 mg by mouth every 6 (six) hours as needed for moderate pain.   Yes [provider]  albuterol (PROVENTIL) (2.5 MG/3ML) 0.083% nebulizer solution Take 3 mLs (2.5 mg total) by nebulization every 6 (six) hours as needed for wheezing or shortness of breath. Patient not  taking: Reported on 07/02/2021 12/20/18   Robinson, [place] N, PA-C  albuterol (VENTOLIN HFA) 108 (90 Base) MCG/ACT inhaler Inhale 2 puffs into the lungs every 4 (four) hours as needed for wheezing or shortness of breath. Patient not taking: Reported on 07/02/2021 12/20/18   Robinson, [place] N, PA-C    Scheduled Meds:  enoxaparin (LOVENOX) injection  40 mg Subcutaneous Q24H   feeding supplement  1 Container Oral TID BM   senna-docusate  2 tablet Oral BID   Continuous Infusions:  lactated ringers with kcl 50 mL/hr at 07/02/21 1817   PRN Meds:.albuterol, HYDROmorphone (DILAUDID) injection, melatonin, oxyCODONE, polyethylene glycol, prochlorperazine  Allergies as of 07/02/2021 - Review Complete 07/02/2021  Allergen Reaction Noted   Shellfish allergy Swelling and Shortness Of Breath 05/28/2011   Other  08/10/2012    Family History  Problem Relation Age of Onset   Drug abuse Father     Social History   Socioeconomic History   Marital status: Single    Spouse name: Not on file   Number of children: 0   Years of education: Not on file   Highest education level: Some college, no degree  Occupational History   Not on file  Tobacco Use   Smoking status: Some Days   Smokeless tobacco: Never  Vaping Use   Vaping Use: Never used  Substance and Sexual Activity   Alcohol use: Yes    Comment: social   Drug use: No   Sexual activity: Never  Other Topics Concern   Not on file  Social History Narrative   Not on file   Social Determinants of Health   Financial Resource Strain: Not  on file  Food Insecurity: Not on file  Transportation Needs: Not on file  Physical Activity: Not on file  Stress: Not on file  Social Connections: Not on file  Intimate Partner Violence: Not on file    Review of Systems: Review of Systems  Constitutional:  Negative for chills, fever and weight loss.  HENT:  Negative for hearing loss and tinnitus.   Respiratory:  Negative for cough and hemoptysis.    Cardiovascular:  Negative for chest pain and palpitations.  Gastrointestinal:  Positive for abdominal pain, nausea and vomiting. Negative for blood in stool, constipation, diarrhea, heartburn and melena.  Genitourinary:  Negative for dysuria and urgency.  Musculoskeletal:  Negative for myalgias and neck pain.  Skin:  Negative for itching and rash.  Neurological:  Negative for dizziness and headaches.  Psychiatric/Behavioral:  Negative for depression and suicidal ideas.     Physical Exam:Physical Exam Constitutional:      Appearance: He is well-developed. He is obese.  HENT:     Head: Normocephalic and atraumatic.     Nose: Nose normal. No congestion.     Mouth/Throat:     Mouth: Mucous membranes are moist.     Pharynx: Oropharynx is clear.  Eyes:     Extraocular Movements: Extraocular movements intact.     Conjunctiva/sclera: Conjunctivae normal.  Cardiovascular:     Rate and Rhythm: Normal rate and regular rhythm.  Pulmonary:     Effort: Pulmonary effort is normal. No respiratory distress.  Abdominal:     General: Abdomen is flat. Bowel sounds are normal. There is no distension.     Palpations: Abdomen is soft. There is no mass.     Tenderness: There is abdominal tenderness (generalized tenderness). There is no guarding or rebound.     Hernia: No hernia is present.  Musculoskeletal:        General: No swelling. Normal range of motion.     Cervical back: Normal range of motion and neck supple.  Skin:    General: Skin is warm and dry.  Neurological:     General: No focal deficit present.     Mental Status: He is alert and oriented to person, place, and time.  Psychiatric:        Mood and Affect: Mood normal.        Behavior: Behavior normal.        Thought Content: Thought content normal.        Judgment: Judgment normal.    Vital signs: Vitals:   07/03/21 0042 07/03/21 0452  BP: (!) 131/92 135/85  Pulse: 78 72  Resp: 20 18  Temp: 98.2 F (36.8 C) (!) 97.5 F (36.4  C)  SpO2: 99% 97%   Last BM Date : 07/01/21 (per pt report)    GI:  Lab Results: Recent Labs    07/02/21 1230 07/03/21 0534  WBC 6.8 6.5  HGB 13.9 14.6  HCT 41.7 44.0  PLT 320 279   BMET Recent Labs    07/02/21 1230 07/03/21 0534  NA 138 140  K 3.4* 4.1  CL 102 108  CO2 26 24  GLUCOSE 123* 96  BUN 6 5*  CREATININE 0.93 0.76  CALCIUM 9.1 9.4   LFT Recent Labs    07/03/21 0534  PROT 7.8  ALBUMIN 4.2  AST 527*  ALT 1,334*  ALKPHOS 259*  BILITOT 2.2*   PT/INR No results for input(s): LABPROT, INR in the last 72 hours.   Studies/Results: CT ABDOMEN PELVIS W  CONTRAST  Result Date: 07/02/2021 CLINICAL DATA:  Abdominal pain radiating to the back for 2 weeks, worse over the last 2 days. Some nausea vomiting. EXAM: CT ABDOMEN AND PELVIS WITH CONTRAST TECHNIQUE: Multidetector CT imaging of the abdomen and pelvis was performed using the standard protocol following bolus administration of intravenous contrast. RADIATION DOSE REDUCTION: This exam was performed according to the departmental dose-optimization program which includes automated exposure control, adjustment of the mA and/or kV according to patient size and/or use of iterative reconstruction technique. CONTRAST:  152m OMNIPAQUE IOHEXOL 300 MG/ML  SOLN COMPARISON:  None Available. FINDINGS: Lower chest: Clear lung bases. Hepatobiliary: Liver normal in size. There are several subtle hypoattenuating liver lesions, not well-defined, most apparent at the lateral dome of segment 7, proximally 1.1 cm in size. No other liver abnormality. Gallbladder is distended, but without wall thickening or pericholecystic fluid. Common bile duct is mildly dilated, 8 mm proximally. Pancreas: There is a dystrophic appearing calcification in the upper pancreatic head surrounded by vague area of relative hypoattenuation, which may reflect a mass. The possible mass measures 2.6 cm. No other evidence of a pancreatic mass. The pancreatic duct  distal to the possible mass is dilated to 4 mm. No pancreatic inflammation. Spleen: Normal in size without focal abnormality. Adrenals/Urinary Tract: Adrenal glands are unremarkable. Kidneys are normal, without renal calculi, focal lesion, or hydronephrosis. Bladder is unremarkable. Stomach/Bowel: Stomach is within normal limits. Appendix appears normal. No evidence of bowel wall thickening, distention, or inflammatory changes. Vascular/Lymphatic: Mildly enlarged retroperitoneal lymph nodes at the level of the upper pole the right kidney, posterior to the inferior vena cava, 1.5 x 1.4 cm in short axis ease. Mildly prominent, but subcentimeter peripancreatic and celiac lymph nodes. No other adenopathy. No vascular abnormality. Portal vein, splenic vein and superior mesenteric vein are widely patent. Reproductive: Unremarkable. Other: No abdominal wall hernia or abnormality. No abdominopelvic ascites. Musculoskeletal: No fracture or acute finding.  No bone lesion. IMPRESSION: 1. Possible 2.6 cm pancreatic mass suspicious for neoplasm, associated with mild dilation of the pancreatic duct distal to the apparent mass, mild dilation of the common bile duct and 2 adjacent enlarged retroperitoneal lymph nodes. There are also subtle low-attenuation lesions in the liver that are not well-defined, possibly metastatic disease. Recommend follow-up pancreatic MRI without and with contrast for further assessment. 2. No other abnormalities.  No acute findings. Electronically Signed   By: DLajean ManesM.D.   On: 07/02/2021 13:50   MR 3D Recon At Scanner  Result Date: 07/02/2021 CLINICAL DATA:  Abdominal pain, nausea/vomiting, possible pancreatic mass on CT EXAM: MRI ABDOMEN WITHOUT AND WITH CONTRAST (INCLUDING MRCP) TECHNIQUE: Multiplanar multisequence MR imaging of the abdomen was performed both before and after the administration of intravenous contrast. Heavily T2-weighted images of the biliary and pancreatic ducts were  obtained, and three-dimensional MRCP images were rendered by post processing. CONTRAST:  161mGADAVIST GADOBUTROL 1 MMOL/ML IV SOLN COMPARISON:  CT abdomen/pelvis dated 07/02/2021 FINDINGS: Motion degraded images. Lower chest: Lung bases are clear. Hepatobiliary: Two subtle liver lesions, measuring 11 mm in segment 8 (series 4/image 9) and 12 mm in segment 5 along the gallbladder fossa (series 4/image 21). These are poorly evaluated due to motion degradation, but enhancement is favored at least within the segment 5 lesion on postcontrast subtraction imaging (series 31/image 62). Associated restricted diffusion. Three additional smaller lesions are present on diffusion imaging (series 8/images 55 and 63). These findings are concerning for metastatic disease. Gallbladder is unremarkable. No cholelithiasis or  inflammatory changes. No intrahepatic or extrahepatic ductal dilatation. No choledocholithiasis is evident. Pancreas: 3.1 x 2.3 cm hypoenhancing mass along the posteromedial aspect of the pancreatic head (series 22/image 67), suspicious for pancreatic neoplasm. Associated mild dilatation of the main pancreatic duct (series 4/image 20), which abruptly ends at the level of the suspected mass (series 4/image 23). No pancreatic atrophy. Spleen:  Within normal limits. Adrenals/Urinary Tract:  Adrenal glands are within normal limits. Kidneys are within normal limits.  No hydronephrosis. Stomach/Bowel: Stomach is within normal limits. Visualized bowel is unremarkable. Vascular/Lymphatic:  No evidence of abdominal aortic aneurysm. Celiac artery, SMA, and portal vein appear distinct from the mass, without definite vascular invasion. Upper abdominal lymphadenopathy, including a 16 mm short axis portacaval node (series 4/image 21) and a 17 mm short axis right retrocaval node at the level of the right renal vein. Other:  No abdominal ascites. Musculoskeletal: No focal osseous lesions. IMPRESSION: 3.1 x 2.3 cm hypoenhancing  mass along the posteromedial aspect of the pancreatic head, suspicious for pancreatic neoplasm. EUS is suggested. Multiple small liver lesions, suspicious for hepatic metastases. Upper abdominal/retroperitoneal lymphadenopathy, suspicious for nodal metastases. Electronically Signed   By: Julian Hy M.D.   On: 07/02/2021 20:16   MR ABDOMEN MRCP W WO CONTAST  Result Date: 07/02/2021 CLINICAL DATA:  Abdominal pain, nausea/vomiting, possible pancreatic mass on CT EXAM: MRI ABDOMEN WITHOUT AND WITH CONTRAST (INCLUDING MRCP) TECHNIQUE: Multiplanar multisequence MR imaging of the abdomen was performed both before and after the administration of intravenous contrast. Heavily T2-weighted images of the biliary and pancreatic ducts were obtained, and three-dimensional MRCP images were rendered by post processing. CONTRAST:  73m GADAVIST GADOBUTROL 1 MMOL/ML IV SOLN COMPARISON:  CT abdomen/pelvis dated 07/02/2021 FINDINGS: Motion degraded images. Lower chest: Lung bases are clear. Hepatobiliary: Two subtle liver lesions, measuring 11 mm in segment 8 (series 4/image 9) and 12 mm in segment 5 along the gallbladder fossa (series 4/image 21). These are poorly evaluated due to motion degradation, but enhancement is favored at least within the segment 5 lesion on postcontrast subtraction imaging (series 31/image 62). Associated restricted diffusion. Three additional smaller lesions are present on diffusion imaging (series 8/images 55 and 63). These findings are concerning for metastatic disease. Gallbladder is unremarkable. No cholelithiasis or inflammatory changes. No intrahepatic or extrahepatic ductal dilatation. No choledocholithiasis is evident. Pancreas: 3.1 x 2.3 cm hypoenhancing mass along the posteromedial aspect of the pancreatic head (series 22/image 67), suspicious for pancreatic neoplasm. Associated mild dilatation of the main pancreatic duct (series 4/image 20), which abruptly ends at the level of the  suspected mass (series 4/image 23). No pancreatic atrophy. Spleen:  Within normal limits. Adrenals/Urinary Tract:  Adrenal glands are within normal limits. Kidneys are within normal limits.  No hydronephrosis. Stomach/Bowel: Stomach is within normal limits. Visualized bowel is unremarkable. Vascular/Lymphatic:  No evidence of abdominal aortic aneurysm. Celiac artery, SMA, and portal vein appear distinct from the mass, without definite vascular invasion. Upper abdominal lymphadenopathy, including a 16 mm short axis portacaval node (series 4/image 21) and a 17 mm short axis right retrocaval node at the level of the right renal vein. Other:  No abdominal ascites. Musculoskeletal: No focal osseous lesions. IMPRESSION: 3.1 x 2.3 cm hypoenhancing mass along the posteromedial aspect of the pancreatic head, suspicious for pancreatic neoplasm. EUS is suggested. Multiple small liver lesions, suspicious for hepatic metastases. Upper abdominal/retroperitoneal lymphadenopathy, suspicious for nodal metastases. Electronically Signed   By: SJulian HyM.D.   On: 07/02/2021 20:16  Impression: Pancreatic Mass - CT ab pelvis w contrast: possible 2.6 cm pancreatic mass suspicious for neoplasm, associated with mild dilation of the pancreatic duct distal to the apparent mass, mild dilation of the common bile duct 8 mm and 2 adjacent enlarged retroperitoneal lymph nodes.  Also subtotal low-attenuation lesions in the liver that are not well-defined, possibly metastatic disease -MRCP: 3.1 x 2.3 cm hypoenhancing mass along the posterior medial aspect of the pancreatic head, suspicious for pancreatic neoplasm.  EUS is suggested.  Multiple small liver lesions suspicious for hepatic metastasis.  Upper abdominal/retroperitoneal lymphadenopathy suspicious for nodal metastasis -No leukocytosis -Lipase 115 -AST 527/ALT 1334/alk phos 259 -T. 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 Will discuss with team and set up EUS for further evaluation Elevated LFTs likely due to mass effect from pancreatic mass. Continue supportive care Hepatitis panel pending. CA 19-9 pending. [facility] will follow   LOS: 1 day   Garnette Scheuermann  PA-C 07/03/2021, 8:09 AM  Contact #  [phone]

## 2021-07-03 NOTE — Consult Note (Addendum)
[place]  Telephone:([phone] Fax:(336) [phone]   [place]  Referral MD: Dr. Bonnielee Haff  Reason for Referral: Pancreatic mass  HPI: Mr. Leon Fischer is a 31 year old male with a past medical history significant for asthma and severe morbid obesity.  He presented to the emergency department with complaints of worsening intermittent diffuse abdominal pain which was worse at the epigastric region and radiating to his back.  He has also been experiencing poor oral intake, nausea, vomiting, constipation.  Lipase was mildly elevated at 148.  His AST was elevated at 577, ALT 1293, alk phos 252, T. bili 2.2.  CT abdomen/pelvis with contrast was performed which showed a possible 2.6 cm pancreatic mass suspicious for neoplasm with associated mild dilation of the pancreatic duct distal to the apparent mass, mild dilation of the common bile duct and 2 adjacent enlarged retroperitoneal lymph nodes.  There were also subtle low-attenuation lesions in the liver that are not well-defined.  MRCP was performed which showed a 3.1 x 2.3 cm hypoenhancing mass along the posteromedial aspect of the pancreatic head suspicious for pancreatic neoplasm, multiple small liver lesions suspicious for hepatic metastases, upper abdominal/retroperitoneal lymphadenopathy suspicious for nodal metastases.  The patient has been seen by GI and under consideration for EUS for further evaluation.  The patient was seen and his mother was at the bedside.  The patient reports ongoing abdominal discomfort.  He reports a poor appetite but has not lost any weight recently.  He has had some mild intermittent nausea and vomiting.  Denies headaches, dizziness, fevers, chills, chest pain, shortness of breath, lower extremity edema.  Denies bleeding.  The patient is single and lives with his mother.  Works at [employer].  He has no children.  Denies history of tobacco use.  Drinks alcohol socially.  Denies  family history of malignancy.  Medical oncology was asked see the patient make recommendations regarding his pancreatic mass.  Past Medical History:  Diagnosis Date   Asthma   :  History reviewed. No pertinent surgical history.:   Current Facility-Administered Medications  Medication Dose Route Frequency Provider Last Rate Last Admin   albuterol (PROVENTIL) (2.5 MG/3ML) 0.083% nebulizer solution 2.5 mg  2.5 mg Nebulization Q6H PRN [place] Crane, Carole N, DO       enoxaparin (LOVENOX) injection 40 mg  40 mg Subcutaneous Q24H Hall, Carole N, DO   40 mg at 07/02/21 2150   feeding supplement (BOOST / RESOURCE BREEZE) liquid 1 Container  1 Container Oral TID BM Kayleen Memos, DO   1 Container at 07/02/21 2150   HYDROmorphone (DILAUDID) injection 0.5 mg  0.5 mg Intravenous Q4H PRN Irene Pap N, DO       lactated ringers 1,000 mL with potassium chloride 40 mEq infusion   Intravenous Continuous Kayleen Memos, DO 50 mL/hr at 07/02/21 1817 New Bag at 07/02/21 1817   melatonin tablet 5 mg  5 mg Oral QHS PRN Irene Pap N, DO       oxyCODONE (Oxy IR/ROXICODONE) immediate release tablet 5 mg  5 mg Oral Q3H PRN Irene Pap N, DO   5 mg at 07/03/21 0746   polyethylene glycol (MIRALAX / GLYCOLAX) packet 17 g  17 g Oral Daily PRN Irene Pap N, DO       prochlorperazine (COMPAZINE) injection 10 mg  10 mg Intravenous Q6H PRN Hall, Carole N, DO       senna-docusate (Senokot-S) tablet 2 tablet  2 tablet Oral BID [place] Crane,  Lorenda Cahill, DO   2 tablet at 07/02/21 2150    Allergies  Allergen Reactions   Shellfish Allergy Swelling and Shortness Of Breath    Eyes, throat swell shut.    Other   : Family History  Problem Relation Age of Onset   Drug abuse Father   : Social History   Socioeconomic History   Marital status: Single    Spouse name: Not on file   Number of children: 0   Years of education: Not on file   Highest education level: Some college, no degree  Occupational History   Not on file  Tobacco  Use   Smoking status: Some Days   Smokeless tobacco: Never  Vaping Use   Vaping Use: Never used  Substance and Sexual Activity   Alcohol use: Yes    Comment: social   Drug use: No   Sexual activity: Never  Other Topics Concern   Not on file  Social History Narrative   Not on file   Social Determinants of Health   Financial Resource Strain: Not on file  Food Insecurity: Not on file  Transportation Needs: Not on file  Physical Activity: Not on file  Stress: Not on file  Social Connections: Not on file  Intimate Partner Violence: Not on file  :  Review of Systems: A comprehensive 14 point review of systems was negative except as noted in the HPI.  Exam: Patient Vitals for the past 24 hrs:  BP Temp Temp src Pulse Resp SpO2 Height Weight  07/03/21 0452 135/85 (!) 97.5 F (36.4 C) Oral 72 18 97 % -- --  07/03/21 0042 (!) 131/92 98.2 F (36.8 C) -- 78 20 99 % -- --  07/02/21 2052 134/79 98 F (36.7 C) -- 66 18 100 % -- --  07/02/21 1659 (!) 155/95 98 F (36.7 C) Oral 64 18 100 % 6' (1.829 m) (!) 147.7 kg  07/02/21 1515 (!) 120/55 -- -- 70 18 100 % -- --  07/02/21 1445 (!) 123/95 -- -- 76 20 100 % -- --  07/02/21 1345 120/82 -- -- 63 18 98 % -- --  07/02/21 1320 135/87 -- -- 63 18 100 % -- --  07/02/21 1245 117/78 -- -- 65 18 98 % -- --  07/02/21 1204 -- -- -- -- -- -- 6' (1.829 m) (!) 156.5 kg  07/02/21 1203 (!) 148/104 98.7 F (37.1 C) Oral 76 18 100 % -- --    General:  well-nourished in no acute distress.   Eyes:  no scleral icterus.   ENT:  There were no oropharyngeal lesions.    Lymphatics:  Negative cervical, supraclavicular or axillary adenopathy.   Respiratory: lungs were clear bilaterally without wheezing or crackles.   Cardiovascular:  Regular rate and rhythm, S1/S2, without murmur, rub or gallop.  There was no pedal edema.   GI: Positive bowel sounds, soft, generalized tenderness.  Skin exam was without echymosis, petichae.   Neuro exam was nonfocal.  Patient was alert and oriented.  Attention was good.   Language was appropriate.  Mood was normal without depression.  Speech was not pressured.  Thought content was not tangential.     Lab Results  Component Value Date   WBC 6.5 07/03/2021   HGB 14.6 07/03/2021   HCT 44.0 07/03/2021   PLT 279 07/03/2021   GLUCOSE 96 07/03/2021   ALT 1,334 (H) 07/03/2021   AST 527 (H) 07/03/2021   NA 140 07/03/2021   K  4.1 07/03/2021   CL 108 07/03/2021   CREATININE 0.76 07/03/2021   BUN 5 (L) 07/03/2021   CO2 24 07/03/2021    CT ABDOMEN PELVIS W CONTRAST  Result Date: 07/02/2021 CLINICAL DATA:  Abdominal pain radiating to the back for 2 weeks, worse over the last 2 days. Some nausea vomiting. EXAM: CT ABDOMEN AND PELVIS WITH CONTRAST TECHNIQUE: Multidetector CT imaging of the abdomen and pelvis was performed using the standard protocol following bolus administration of intravenous contrast. RADIATION DOSE REDUCTION: This exam was performed according to the departmental dose-optimization program which includes automated exposure control, adjustment of the mA and/or kV according to patient size and/or use of iterative reconstruction technique. CONTRAST:  128m OMNIPAQUE IOHEXOL 300 MG/ML  SOLN COMPARISON:  None Available. FINDINGS: Lower chest: Clear lung bases. Hepatobiliary: Liver normal in size. There are several subtle hypoattenuating liver lesions, not well-defined, most apparent at the lateral dome of segment 7, proximally 1.1 cm in size. No other liver abnormality. Gallbladder is distended, but without wall thickening or pericholecystic fluid. Common bile duct is mildly dilated, 8 mm proximally. Pancreas: There is a dystrophic appearing calcification in the upper pancreatic head surrounded by vague area of relative hypoattenuation, which may reflect a mass. The possible mass measures 2.6 cm. No other evidence of a pancreatic mass. The pancreatic duct distal to the possible mass is dilated to 4 mm. No  pancreatic inflammation. Spleen: Normal in size without focal abnormality. Adrenals/Urinary Tract: Adrenal glands are unremarkable. Kidneys are normal, without renal calculi, focal lesion, or hydronephrosis. Bladder is unremarkable. Stomach/Bowel: Stomach is within normal limits. Appendix appears normal. No evidence of bowel wall thickening, distention, or inflammatory changes. Vascular/Lymphatic: Mildly enlarged retroperitoneal lymph nodes at the level of the upper pole the right kidney, posterior to the inferior vena cava, 1.5 x 1.4 cm in short axis ease. Mildly prominent, but subcentimeter peripancreatic and celiac lymph nodes. No other adenopathy. No vascular abnormality. Portal vein, splenic vein and superior mesenteric vein are widely patent. Reproductive: Unremarkable. Other: No abdominal wall hernia or abnormality. No abdominopelvic ascites. Musculoskeletal: No fracture or acute finding.  No bone lesion. IMPRESSION: 1. Possible 2.6 cm pancreatic mass suspicious for neoplasm, associated with mild dilation of the pancreatic duct distal to the apparent mass, mild dilation of the common bile duct and 2 adjacent enlarged retroperitoneal lymph nodes. There are also subtle low-attenuation lesions in the liver that are not well-defined, possibly metastatic disease. Recommend follow-up pancreatic MRI without and with contrast for further assessment. 2. No other abnormalities.  No acute findings. Electronically Signed   By: DLajean ManesM.D.   On: 07/02/2021 13:50   MR 3D Recon At Scanner  Result Date: 07/02/2021 CLINICAL DATA:  Abdominal pain, nausea/vomiting, possible pancreatic mass on CT EXAM: MRI ABDOMEN WITHOUT AND WITH CONTRAST (INCLUDING MRCP) TECHNIQUE: Multiplanar multisequence MR imaging of the abdomen was performed both before and after the administration of intravenous contrast. Heavily T2-weighted images of the biliary and pancreatic ducts were obtained, and three-dimensional MRCP images were  rendered by post processing. CONTRAST:  131mGADAVIST GADOBUTROL 1 MMOL/ML IV SOLN COMPARISON:  CT abdomen/pelvis dated 07/02/2021 FINDINGS: Motion degraded images. Lower chest: Lung bases are clear. Hepatobiliary: Two subtle liver lesions, measuring 11 mm in segment 8 (series 4/image 9) and 12 mm in segment 5 along the gallbladder fossa (series 4/image 21). These are poorly evaluated due to motion degradation, but enhancement is favored at least within the segment 5 lesion on postcontrast subtraction imaging (series 31/image 62).  Associated restricted diffusion. Three additional smaller lesions are present on diffusion imaging (series 8/images 55 and 63). These findings are concerning for metastatic disease. Gallbladder is unremarkable. No cholelithiasis or inflammatory changes. No intrahepatic or extrahepatic ductal dilatation. No choledocholithiasis is evident. Pancreas: 3.1 x 2.3 cm hypoenhancing mass along the posteromedial aspect of the pancreatic head (series 22/image 67), suspicious for pancreatic neoplasm. Associated mild dilatation of the main pancreatic duct (series 4/image 20), which abruptly ends at the level of the suspected mass (series 4/image 23). No pancreatic atrophy. Spleen:  Within normal limits. Adrenals/Urinary Tract:  Adrenal glands are within normal limits. Kidneys are within normal limits.  No hydronephrosis. Stomach/Bowel: Stomach is within normal limits. Visualized bowel is unremarkable. Vascular/Lymphatic:  No evidence of abdominal aortic aneurysm. Celiac artery, SMA, and portal vein appear distinct from the mass, without definite vascular invasion. Upper abdominal lymphadenopathy, including a 16 mm short axis portacaval node (series 4/image 21) and a 17 mm short axis right retrocaval node at the level of the right renal vein. Other:  No abdominal ascites. Musculoskeletal: No focal osseous lesions. IMPRESSION: 3.1 x 2.3 cm hypoenhancing mass along the posteromedial aspect of the  pancreatic head, suspicious for pancreatic neoplasm. EUS is suggested. Multiple small liver lesions, suspicious for hepatic metastases. Upper abdominal/retroperitoneal lymphadenopathy, suspicious for nodal metastases. Electronically Signed   By: Julian Hy M.D.   On: 07/02/2021 20:16   MR ABDOMEN MRCP W WO CONTAST  Result Date: 07/02/2021 CLINICAL DATA:  Abdominal pain, nausea/vomiting, possible pancreatic mass on CT EXAM: MRI ABDOMEN WITHOUT AND WITH CONTRAST (INCLUDING MRCP) TECHNIQUE: Multiplanar multisequence MR imaging of the abdomen was performed both before and after the administration of intravenous contrast. Heavily T2-weighted images of the biliary and pancreatic ducts were obtained, and three-dimensional MRCP images were rendered by post processing. CONTRAST:  69m GADAVIST GADOBUTROL 1 MMOL/ML IV SOLN COMPARISON:  CT abdomen/pelvis dated 07/02/2021 FINDINGS: Motion degraded images. Lower chest: Lung bases are clear. Hepatobiliary: Two subtle liver lesions, measuring 11 mm in segment 8 (series 4/image 9) and 12 mm in segment 5 along the gallbladder fossa (series 4/image 21). These are poorly evaluated due to motion degradation, but enhancement is favored at least within the segment 5 lesion on postcontrast subtraction imaging (series 31/image 62). Associated restricted diffusion. Three additional smaller lesions are present on diffusion imaging (series 8/images 55 and 63). These findings are concerning for metastatic disease. Gallbladder is unremarkable. No cholelithiasis or inflammatory changes. No intrahepatic or extrahepatic ductal dilatation. No choledocholithiasis is evident. Pancreas: 3.1 x 2.3 cm hypoenhancing mass along the posteromedial aspect of the pancreatic head (series 22/image 67), suspicious for pancreatic neoplasm. Associated mild dilatation of the main pancreatic duct (series 4/image 20), which abruptly ends at the level of the suspected mass (series 4/image 23). No pancreatic  atrophy. Spleen:  Within normal limits. Adrenals/Urinary Tract:  Adrenal glands are within normal limits. Kidneys are within normal limits.  No hydronephrosis. Stomach/Bowel: Stomach is within normal limits. Visualized bowel is unremarkable. Vascular/Lymphatic:  No evidence of abdominal aortic aneurysm. Celiac artery, SMA, and portal vein appear distinct from the mass, without definite vascular invasion. Upper abdominal lymphadenopathy, including a 16 mm short axis portacaval node (series 4/image 21) and a 17 mm short axis right retrocaval node at the level of the right renal vein. Other:  No abdominal ascites. Musculoskeletal: No focal osseous lesions. IMPRESSION: 3.1 x 2.3 cm hypoenhancing mass along the posteromedial aspect of the pancreatic head, suspicious for pancreatic neoplasm. EUS is suggested. Multiple small  liver lesions, suspicious for hepatic metastases. Upper abdominal/retroperitoneal lymphadenopathy, suspicious for nodal metastases. Electronically Signed   By: Julian Hy M.D.   On: 07/02/2021 20:16     CT ABDOMEN PELVIS W CONTRAST  Result Date: 07/02/2021 CLINICAL DATA:  Abdominal pain radiating to the back for 2 weeks, worse over the last 2 days. Some nausea vomiting. EXAM: CT ABDOMEN AND PELVIS WITH CONTRAST TECHNIQUE: Multidetector CT imaging of the abdomen and pelvis was performed using the standard protocol following bolus administration of intravenous contrast. RADIATION DOSE REDUCTION: This exam was performed according to the departmental dose-optimization program which includes automated exposure control, adjustment of the mA and/or kV according to patient size and/or use of iterative reconstruction technique. CONTRAST:  132m OMNIPAQUE IOHEXOL 300 MG/ML  SOLN COMPARISON:  None Available. FINDINGS: Lower chest: Clear lung bases. Hepatobiliary: Liver normal in size. There are several subtle hypoattenuating liver lesions, not well-defined, most apparent at the lateral dome of segment  7, proximally 1.1 cm in size. No other liver abnormality. Gallbladder is distended, but without wall thickening or pericholecystic fluid. Common bile duct is mildly dilated, 8 mm proximally. Pancreas: There is a dystrophic appearing calcification in the upper pancreatic head surrounded by vague area of relative hypoattenuation, which may reflect a mass. The possible mass measures 2.6 cm. No other evidence of a pancreatic mass. The pancreatic duct distal to the possible mass is dilated to 4 mm. No pancreatic inflammation. Spleen: Normal in size without focal abnormality. Adrenals/Urinary Tract: Adrenal glands are unremarkable. Kidneys are normal, without renal calculi, focal lesion, or hydronephrosis. Bladder is unremarkable. Stomach/Bowel: Stomach is within normal limits. Appendix appears normal. No evidence of bowel wall thickening, distention, or inflammatory changes. Vascular/Lymphatic: Mildly enlarged retroperitoneal lymph nodes at the level of the upper pole the right kidney, posterior to the inferior vena cava, 1.5 x 1.4 cm in short axis ease. Mildly prominent, but subcentimeter peripancreatic and celiac lymph nodes. No other adenopathy. No vascular abnormality. Portal vein, splenic vein and superior mesenteric vein are widely patent. Reproductive: Unremarkable. Other: No abdominal wall hernia or abnormality. No abdominopelvic ascites. Musculoskeletal: No fracture or acute finding.  No bone lesion. IMPRESSION: 1. Possible 2.6 cm pancreatic mass suspicious for neoplasm, associated with mild dilation of the pancreatic duct distal to the apparent mass, mild dilation of the common bile duct and 2 adjacent enlarged retroperitoneal lymph nodes. There are also subtle low-attenuation lesions in the liver that are not well-defined, possibly metastatic disease. Recommend follow-up pancreatic MRI without and with contrast for further assessment. 2. No other abnormalities.  No acute findings. Electronically Signed   By:  DLajean ManesM.D.   On: 07/02/2021 13:50   MR 3D Recon At Scanner  Result Date: 07/02/2021 CLINICAL DATA:  Abdominal pain, nausea/vomiting, possible pancreatic mass on CT EXAM: MRI ABDOMEN WITHOUT AND WITH CONTRAST (INCLUDING MRCP) TECHNIQUE: Multiplanar multisequence MR imaging of the abdomen was performed both before and after the administration of intravenous contrast. Heavily T2-weighted images of the biliary and pancreatic ducts were obtained, and three-dimensional MRCP images were rendered by post processing. CONTRAST:  168mGADAVIST GADOBUTROL 1 MMOL/ML IV SOLN COMPARISON:  CT abdomen/pelvis dated 07/02/2021 FINDINGS: Motion degraded images. Lower chest: Lung bases are clear. Hepatobiliary: Two subtle liver lesions, measuring 11 mm in segment 8 (series 4/image 9) and 12 mm in segment 5 along the gallbladder fossa (series 4/image 21). These are poorly evaluated due to motion degradation, but enhancement is favored at least within the segment 5 lesion on postcontrast  subtraction imaging (series 31/image 62). Associated restricted diffusion. Three additional smaller lesions are present on diffusion imaging (series 8/images 55 and 63). These findings are concerning for metastatic disease. Gallbladder is unremarkable. No cholelithiasis or inflammatory changes. No intrahepatic or extrahepatic ductal dilatation. No choledocholithiasis is evident. Pancreas: 3.1 x 2.3 cm hypoenhancing mass along the posteromedial aspect of the pancreatic head (series 22/image 67), suspicious for pancreatic neoplasm. Associated mild dilatation of the main pancreatic duct (series 4/image 20), which abruptly ends at the level of the suspected mass (series 4/image 23). No pancreatic atrophy. Spleen:  Within normal limits. Adrenals/Urinary Tract:  Adrenal glands are within normal limits. Kidneys are within normal limits.  No hydronephrosis. Stomach/Bowel: Stomach is within normal limits. Visualized bowel is unremarkable.  Vascular/Lymphatic:  No evidence of abdominal aortic aneurysm. Celiac artery, SMA, and portal vein appear distinct from the mass, without definite vascular invasion. Upper abdominal lymphadenopathy, including a 16 mm short axis portacaval node (series 4/image 21) and a 17 mm short axis right retrocaval node at the level of the right renal vein. Other:  No abdominal ascites. Musculoskeletal: No focal osseous lesions. IMPRESSION: 3.1 x 2.3 cm hypoenhancing mass along the posteromedial aspect of the pancreatic head, suspicious for pancreatic neoplasm. EUS is suggested. Multiple small liver lesions, suspicious for hepatic metastases. Upper abdominal/retroperitoneal lymphadenopathy, suspicious for nodal metastases. Electronically Signed   By: Julian Hy M.D.   On: 07/02/2021 20:16   MR ABDOMEN MRCP W WO CONTAST  Result Date: 07/02/2021 CLINICAL DATA:  Abdominal pain, nausea/vomiting, possible pancreatic mass on CT EXAM: MRI ABDOMEN WITHOUT AND WITH CONTRAST (INCLUDING MRCP) TECHNIQUE: Multiplanar multisequence MR imaging of the abdomen was performed both before and after the administration of intravenous contrast. Heavily T2-weighted images of the biliary and pancreatic ducts were obtained, and three-dimensional MRCP images were rendered by post processing. CONTRAST:  49m GADAVIST GADOBUTROL 1 MMOL/ML IV SOLN COMPARISON:  CT abdomen/pelvis dated 07/02/2021 FINDINGS: Motion degraded images. Lower chest: Lung bases are clear. Hepatobiliary: Two subtle liver lesions, measuring 11 mm in segment 8 (series 4/image 9) and 12 mm in segment 5 along the gallbladder fossa (series 4/image 21). These are poorly evaluated due to motion degradation, but enhancement is favored at least within the segment 5 lesion on postcontrast subtraction imaging (series 31/image 62). Associated restricted diffusion. Three additional smaller lesions are present on diffusion imaging (series 8/images 55 and 63). These findings are concerning  for metastatic disease. Gallbladder is unremarkable. No cholelithiasis or inflammatory changes. No intrahepatic or extrahepatic ductal dilatation. No choledocholithiasis is evident. Pancreas: 3.1 x 2.3 cm hypoenhancing mass along the posteromedial aspect of the pancreatic head (series 22/image 67), suspicious for pancreatic neoplasm. Associated mild dilatation of the main pancreatic duct (series 4/image 20), which abruptly ends at the level of the suspected mass (series 4/image 23). No pancreatic atrophy. Spleen:  Within normal limits. Adrenals/Urinary Tract:  Adrenal glands are within normal limits. Kidneys are within normal limits.  No hydronephrosis. Stomach/Bowel: Stomach is within normal limits. Visualized bowel is unremarkable. Vascular/Lymphatic:  No evidence of abdominal aortic aneurysm. Celiac artery, SMA, and portal vein appear distinct from the mass, without definite vascular invasion. Upper abdominal lymphadenopathy, including a 16 mm short axis portacaval node (series 4/image 21) and a 17 mm short axis right retrocaval node at the level of the right renal vein. Other:  No abdominal ascites. Musculoskeletal: No focal osseous lesions. IMPRESSION: 3.1 x 2.3 cm hypoenhancing mass along the posteromedial aspect of the pancreatic head, suspicious for pancreatic neoplasm.  EUS is suggested. Multiple small liver lesions, suspicious for hepatic metastases. Upper abdominal/retroperitoneal lymphadenopathy, suspicious for nodal metastases. Electronically Signed   By: Julian Hy M.D.   On: 07/02/2021 20:16    Assessment and Plan:  This is a 31 year old male with  1) pancreatic mass and liver lesions -CT abdomen/pelvis with contrast 07/02/2021- "1. Possible 2.6 cm pancreatic mass suspicious for neoplasm, associated with mild dilation of the pancreatic duct distal to the apparent mass, mild dilation of the common bile duct and 2 adjacent enlarged retroperitoneal lymph nodes. There are also subtle  low-attenuation lesions in the liver that are not well-defined, possibly metastatic disease. Recommend follow-up pancreatic MRI without and with contrast for further assessment. 2. No other abnormalities.  No acute findings." -MRCP 07/02/2021- "3.1 x 2.3 cm hypoenhancing mass along the posteromedial aspect of the pancreatic head, suspicious for pancreatic neoplasm. EUS is suggested. Multiple small liver lesions, suspicious for hepatic metastases. Upper abdominal/retroperitoneal lymphadenopathy, suspicious for nodal metastases."  2) transaminitis  3) asthma  PLAN: -Discussed imaging findings with the patient and his mother.  We discussed that findings are concerning for malignancy, likely pancreatic as primary. -Recommend for ERCP with consideration of pancreatic/cbd stenting and EUS directed biopsy of pancreatic mass. -Recommend CT chest to complete staging work-up. -Follow-up on CA 19.9 which was ordered today. -Discussed with the patient and his mother that once we have additional work-up, will discuss treatment options with them.  Outpatient follow-up will be scheduled.  Thank you for this referral.   Mikey Bussing, DNP, AGPCNP-BC, AOCNP    ADDENDUM  Patient was Personally and independently interviewed, examined and relevant elements of the history of present illness were reviewed in details and an assessment and plan was created. All elements of the patient's history of present illness , assessment and plan were discussed in details with Mikey Bussing, DNP, AGPCNP-BC, AOCNP . The above documentation reflects our combined findings assessment and plan.   Sullivan Lone MD MS

## 2021-07-04 ENCOUNTER — Encounter (HOSPITAL_COMMUNITY): Payer: Self-pay | Admitting: Gastroenterology

## 2021-07-04 DIAGNOSIS — K8689 Other specified diseases of pancreas: Secondary | ICD-10-CM | POA: Diagnosis not present

## 2021-07-04 LAB — CBC
HCT: 45.3 % (ref 39.0–52.0)
Hemoglobin: 14.5 g/dL (ref 13.0–17.0)
MCH: 28.9 pg (ref 26.0–34.0)
MCHC: 32 g/dL (ref 30.0–36.0)
MCV: 90.4 fL (ref 80.0–100.0)
Platelets: 309 10*3/uL (ref 150–400)
RBC: 5.01 MIL/uL (ref 4.22–5.81)
RDW: 13.8 % (ref 11.5–15.5)
WBC: 6.8 10*3/uL (ref 4.0–10.5)
nRBC: 0 % (ref 0.0–0.2)

## 2021-07-04 LAB — COMPREHENSIVE METABOLIC PANEL
ALT: 1202 U/L — ABNORMAL HIGH (ref 0–44)
AST: 388 U/L — ABNORMAL HIGH (ref 15–41)
Albumin: 4.4 g/dL (ref 3.5–5.0)
Alkaline Phosphatase: 278 U/L — ABNORMAL HIGH (ref 38–126)
Anion gap: 6 (ref 5–15)
BUN: 6 mg/dL (ref 6–20)
CO2: 26 mmol/L (ref 22–32)
Calcium: 9.8 mg/dL (ref 8.9–10.3)
Chloride: 107 mmol/L (ref 98–111)
Creatinine, Ser: 0.91 mg/dL (ref 0.61–1.24)
GFR, Estimated: >60 mL/min (ref >60)
Glucose, Bld: 101 mg/dL — ABNORMAL HIGH (ref 70–99)
Potassium: 4 mmol/L (ref 3.5–5.1)
Sodium: 139 mmol/L (ref 135–145)
Total Bilirubin: 3.1 mg/dL — ABNORMAL HIGH (ref 0.3–1.2)
Total Protein: 8.1 g/dL (ref 6.5–8.1)

## 2021-07-04 LAB — PROTIME-INR
INR: 1 (ref 0.8–1.2)
Prothrombin Time: 13.1 seconds (ref 11.4–15.2)

## 2021-07-04 LAB — BILIRUBIN, DIRECT: Bilirubin, Direct: 1.9 mg/dL — ABNORMAL HIGH (ref 0.0–0.2)

## 2021-07-04 LAB — CANCER ANTIGEN 19-9: CA 19-9: 3 U/mL (ref 0–35)

## 2021-07-04 MED ORDER — PANTOPRAZOLE SODIUM 40 MG IV SOLR
40.0000 mg | Freq: Two times a day (BID) | INTRAVENOUS | Status: DC
Start: 2021-07-04 — End: 2021-07-05
  Administered 2021-07-04 – 2021-07-05 (×3): 40 mg via INTRAVENOUS
  Filled 2021-07-04 (×3): qty 10

## 2021-07-04 MED ORDER — POLYETHYLENE GLYCOL 3350 17 G PO PACK
17.0000 g | PACK | Freq: Every day | ORAL | Status: DC
  Administered 2021-07-04 – 2021-07-05 (×2): 17 g via ORAL
  Filled 2021-07-04 (×2): qty 1

## 2021-07-04 NOTE — Progress Notes (Signed)
PROGRESS NOTE    Leon Fischer.  ZMO:[id number] DOB: Dec 11, 1990 DOA: 07/02/2021 PCP: Patient, No Pcp Per (Inactive)   Brief Narrative: 31 year old with past medical history significant for asthma, 6 severe morbid obesity, presented with worsening intermittent diffuse abdominal pain, worse epigastric region radiated to his back.  He was found to have abnormal liver function test.  CT abdomen and pelvis revealed a pancreatic mass.  Patient was admitted for pain control.  GI and oncology consulted.  GI Is planning EUS plus or minus ERCP Wednesday 6/7 at 8:30 am as outpatient at [facility].     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  Subtle  low-attenuation lesions in the liver that are not well defined. -MRI/MRCP; 3 x 2.3 cm hypoenhancing mass along the posterior medial aspect of the pancreatic head, suspicious for pancreatic neoplasm.  Multiple small liver lesions, suspicious for hepatic metastasis.  Upper abdominal retroperitoneal lymphadenopathy. -CA-19: negative.  -Oncology consulted, recommend biopsy.  -GI planning EUS , ERCP out patient 6/07. -IgG4; pending.  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    2-Transaminases;  Thought to be related to liver lesion.  GI following.  Hepatitis panel negative.   Hypokalemia;  Replaced.   Constipation; continue with BM  Class III Obesity; Life style modification.     Nutrition Problem: Inadequate oral intake Etiology: nausea, vomiting    Signs/Symptoms: per patient/family report    Interventions: Premier Protein, MVI  Estimated  body mass index is 44.16 kg/m as calculated from the following:   Height as of this encounter: 6' (1.829 m).   Weight as of this encounter: 147.7 kg.   DVT prophylaxis: Lovenox Code Status: Full code Family Communication: Mother at bedside Disposition Plan:  Status is: Inpatient Remains inpatient appropriate because: pain controlled, make sure he tolerates diet.     Consultants:  GI Oncology   Procedures:    Antimicrobials:    Subjective: Pain has been controlled with oxycodone. He vomited last night. Poor oral intake.   Objective: Vitals:   07/03/21 0452 07/03/21 1331 07/03/21 1930 07/04/21 0426  BP: 135/85 137/87 (!) 142/98 139/88  Pulse: 72 73 69 65  Resp: '18 19 16 18  '$ Temp: (!) 97.5 F (36.4 C) 97.9 F (36.6 C) 98.6 F (37 C) 97.9 F (36.6 C)  TempSrc: Oral  Oral Oral  SpO2: 97% 98% 100% 99%  Weight:      Height:        Intake/Output Summary (Last 24 hours) at 07/04/2021 1335 Last data filed at 07/04/2021 1100 Gross per 24 hour  Intake 2195.07 ml  Output --  Net 2195.07 ml   Filed Weights   07/02/21 1204 07/02/21 1659  Weight: (!) 156.5 kg (!) 147.7 kg    Examination:  General exam: Appears calm and comfortable  Respiratory system: Clear to auscultation. Respiratory effort normal. Cardiovascular system: S1 & S2 heard, RRR. No JVD, murmurs, rubs, gallops or clicks. No pedal edema. Gastrointestinal system: Abdomen is nondistended, soft and mild tenderness Central nervous system: Alert and oriented. . Extremities: Symmetric 5 x 5 power.    Data Reviewed: I have personally reviewed following labs and  imaging studies  CBC: Recent Labs  Lab 07/02/21 1230 07/03/21 0534 07/04/21 0538  WBC 6.8 6.5 6.8  NEUTROABS 5.0 4.4  --   HGB 13.9 14.6 14.5  HCT 41.7 44.0 45.3  MCV 88.7 90.7 90.4  PLT 320 279 841   Basic Metabolic Panel: Recent Labs  Lab 07/02/21 1230 07/03/21 0534 07/04/21 0538  NA 138 140 139  K 3.4* 4.1 4.0  CL 102 108 107   CO2 '26 24 26  '$ GLUCOSE 123* 96 101*  BUN 6 5* 6  CREATININE 0.93 0.76 0.91  CALCIUM 9.1 9.4 9.8  MG  --  2.3  --   PHOS  --  4.1  --    GFR: Estimated Creatinine Clearance: 177.3 mL/min (by C-G formula based on SCr of 0.91 mg/dL). Liver Function Tests: Recent Labs  Lab 07/02/21 1230 07/03/21 0534 07/04/21 0538  AST 577* 527* 388*  ALT 1,293* 1,334* 1,202*  ALKPHOS 252* 259* 278*  BILITOT 2.2* 2.2* 3.1*  PROT 7.6 7.8 8.1  ALBUMIN 4.1 4.2 4.4   Recent Labs  Lab 07/02/21 1230 07/03/21 0534  LIPASE 148* 115*   No results for input(s): AMMONIA in the last 168 hours. Coagulation Profile: Recent Labs  Lab 07/04/21 0538  INR 1.0   Cardiac Enzymes: No results for input(s): CKTOTAL, CKMB, CKMBINDEX, TROPONINI in the last 168 hours. BNP (last 3 results) No results for input(s): PROBNP in the last 8760 hours. HbA1C: No results for input(s): HGBA1C in the last 72 hours. CBG: No results for input(s): GLUCAP in the last 168 hours. Lipid Profile: No results for input(s): CHOL, HDL, LDLCALC, TRIG, CHOLHDL, LDLDIRECT in the last 72 hours. Thyroid Function Tests: No results for input(s): TSH, T4TOTAL, FREET4, T3FREE, THYROIDAB in the last 72 hours. Anemia Panel: No results for input(s): VITAMINB12, FOLATE, FERRITIN, TIBC, IRON, RETICCTPCT in the last 72 hours. Sepsis Labs: No results for input(s): PROCALCITON, LATICACIDVEN in the last 168 hours.  No results found for this or any previous visit (from the past 240 hour(s)).       Radiology Studies: CT CHEST W CONTRAST  Result Date: 07/03/2021 CLINICAL DATA:  Pancreatic carcinoma staging EXAM: CT CHEST WITH CONTRAST TECHNIQUE: Multidetector CT imaging of the chest was performed during intravenous contrast administration. RADIATION DOSE REDUCTION: This exam was performed according to the departmental dose-optimization program which includes automated exposure control, adjustment of the mA and/or kV according to patient size  and/or use of iterative reconstruction technique. CONTRAST:  61m OMNIPAQUE IOHEXOL 300 MG/ML  SOLN COMPARISON:  None Available. FINDINGS: Cardiovascular: There is homogeneous enhancement in thoracic aorta. There are no filling defects in the central pulmonary artery branches. Mediastinum/Nodes: No significant lymphadenopathy is seen. Lungs/Pleura: There is no focal pulmonary consolidation. There are no discrete lung nodules. Small linear densities in the right posterior costophrenic angle may suggest minimal scarring or subsegmental atelectasis. There is no pleural effusion or pneumothorax. Upper Abdomen: There is 2.6 cm ill-defined low-attenuation lesion at the junction of head and body of pancreas. There is dilation of pancreatic duct in the body and tail. There is no dilation of bile ducts. Gallbladder is distended. There is no wall thickening in gallbladder. Small space-occupying lesions seen in the liver in the previous MRI could not be distinctly identified in the CT images. Musculoskeletal: There are no focal lytic or sclerotic lesions. Degenerative changes are noted in the thoracic spine. Gynecomastia is seen in both breasts. IMPRESSION: No significant abnormality is seen in the CT scan of chest.  There is no significant lymphadenopathy. There are no discrete lung nodules. Low-density lesion seen in the pancreas is better evaluated in the MRI examination. Electronically Signed   By: Elmer Picker M.D.   On: 07/03/2021 16:08   MR 3D Recon At Scanner  Result Date: 07/02/2021 CLINICAL DATA:  Abdominal pain, nausea/vomiting, possible pancreatic mass on CT EXAM: MRI ABDOMEN WITHOUT AND WITH CONTRAST (INCLUDING MRCP) TECHNIQUE: Multiplanar multisequence MR imaging of the abdomen was performed both before and after the administration of intravenous contrast. Heavily T2-weighted images of the biliary and pancreatic ducts were obtained, and three-dimensional MRCP images were rendered by post processing.  CONTRAST:  46m GADAVIST GADOBUTROL 1 MMOL/ML IV SOLN COMPARISON:  CT abdomen/pelvis dated 07/02/2021 FINDINGS: Motion degraded images. Lower chest: Lung bases are clear. Hepatobiliary: Two subtle liver lesions, measuring 11 mm in segment 8 (series 4/image 9) and 12 mm in segment 5 along the gallbladder fossa (series 4/image 21). These are poorly evaluated due to motion degradation, but enhancement is favored at least within the segment 5 lesion on postcontrast subtraction imaging (series 31/image 62). Associated restricted diffusion. Three additional smaller lesions are present on diffusion imaging (series 8/images 55 and 63). These findings are concerning for metastatic disease. Gallbladder is unremarkable. No cholelithiasis or inflammatory changes. No intrahepatic or extrahepatic ductal dilatation. No choledocholithiasis is evident. Pancreas: 3.1 x 2.3 cm hypoenhancing mass along the posteromedial aspect of the pancreatic head (series 22/image 67), suspicious for pancreatic neoplasm. Associated mild dilatation of the main pancreatic duct (series 4/image 20), which abruptly ends at the level of the suspected mass (series 4/image 23). No pancreatic atrophy. Spleen:  Within normal limits. Adrenals/Urinary Tract:  Adrenal glands are within normal limits. Kidneys are within normal limits.  No hydronephrosis. Stomach/Bowel: Stomach is within normal limits. Visualized bowel is unremarkable. Vascular/Lymphatic:  No evidence of abdominal aortic aneurysm. Celiac artery, SMA, and portal vein appear distinct from the mass, without definite vascular invasion. Upper abdominal lymphadenopathy, including a 16 mm short axis portacaval node (series 4/image 21) and a 17 mm short axis right retrocaval node at the level of the right renal vein. Other:  No abdominal ascites. Musculoskeletal: No focal osseous lesions. IMPRESSION: 3.1 x 2.3 cm hypoenhancing mass along the posteromedial aspect of the pancreatic head, suspicious for  pancreatic neoplasm. EUS is suggested. Multiple small liver lesions, suspicious for hepatic metastases. Upper abdominal/retroperitoneal lymphadenopathy, suspicious for nodal metastases. Electronically Signed   By: SJulian HyM.D.   On: 07/02/2021 20:16   MR ABDOMEN MRCP W WO CONTAST  Result Date: 07/02/2021 CLINICAL DATA:  Abdominal pain, nausea/vomiting, possible pancreatic mass on CT EXAM: MRI ABDOMEN WITHOUT AND WITH CONTRAST (INCLUDING MRCP) TECHNIQUE: Multiplanar multisequence MR imaging of the abdomen was performed both before and after the administration of intravenous contrast. Heavily T2-weighted images of the biliary and pancreatic ducts were obtained, and three-dimensional MRCP images were rendered by post processing. CONTRAST:  164mGADAVIST GADOBUTROL 1 MMOL/ML IV SOLN COMPARISON:  CT abdomen/pelvis dated 07/02/2021 FINDINGS: Motion degraded images. Lower chest: Lung bases are clear. Hepatobiliary: Two subtle liver lesions, measuring 11 mm in segment 8 (series 4/image 9) and 12 mm in segment 5 along the gallbladder fossa (series 4/image 21). These are poorly evaluated due to motion degradation, but enhancement is favored at least within the segment 5 lesion on postcontrast subtraction imaging (series 31/image 62). Associated restricted diffusion. Three additional smaller lesions are present on diffusion imaging (series 8/images 55 and 63). These findings are concerning for metastatic disease. Gallbladder  is unremarkable. No cholelithiasis or inflammatory changes. No intrahepatic or extrahepatic ductal dilatation. No choledocholithiasis is evident. Pancreas: 3.1 x 2.3 cm hypoenhancing mass along the posteromedial aspect of the pancreatic head (series 22/image 67), suspicious for pancreatic neoplasm. Associated mild dilatation of the main pancreatic duct (series 4/image 20), which abruptly ends at the level of the suspected mass (series 4/image 23). No pancreatic atrophy. Spleen:  Within normal  limits. Adrenals/Urinary Tract:  Adrenal glands are within normal limits. Kidneys are within normal limits.  No hydronephrosis. Stomach/Bowel: Stomach is within normal limits. Visualized bowel is unremarkable. Vascular/Lymphatic:  No evidence of abdominal aortic aneurysm. Celiac artery, SMA, and portal vein appear distinct from the mass, without definite vascular invasion. Upper abdominal lymphadenopathy, including a 16 mm short axis portacaval node (series 4/image 21) and a 17 mm short axis right retrocaval node at the level of the right renal vein. Other:  No abdominal ascites. Musculoskeletal: No focal osseous lesions. IMPRESSION: 3.1 x 2.3 cm hypoenhancing mass along the posteromedial aspect of the pancreatic head, suspicious for pancreatic neoplasm. EUS is suggested. Multiple small liver lesions, suspicious for hepatic metastases. Upper abdominal/retroperitoneal lymphadenopathy, suspicious for nodal metastases. Electronically Signed   By: Julian Hy M.D.   On: 07/02/2021 20:16        Scheduled Meds:  enoxaparin (LOVENOX) injection  40 mg Subcutaneous Q24H   feeding supplement  1 Container Oral TID BM   pantoprazole (PROTONIX) IV  40 mg Intravenous Q12H   polyethylene glycol  17 g Oral Daily   senna-docusate  2 tablet Oral BID   Continuous Infusions:  lactated ringers with kcl 50 mL/hr at 07/04/21 1011     LOS: 2 days    Time spent: 35 minutes    Clarann Helvey A Nayah Lukens, MD [facility]   If 7PM-7AM, please contact night-coverage [email or web address]  07/04/2021, 1:35 PM

## 2021-07-04 NOTE — Progress Notes (Signed)
[facility] Gastroenterology Progress Note  Leon Fischer. 31 y.o. 1990/09/01  CC:  Pancreatic mass   Subjective: Patient states he is doing better overall today. Had some nausea with one episode of nonbloody emesis. Tolerating diet well. Abdominal pain has improved.   ROS : Review of Systems  Constitutional:  Negative for chills and fever.  Gastrointestinal:  Positive for abdominal pain, nausea and vomiting. Negative for blood in stool, constipation, diarrhea, heartburn and melena.  Genitourinary:  Negative for dysuria and urgency.     Objective: Vital signs in last 24 hours: Vitals:   07/03/21 1930 07/04/21 0426  BP: (!) 142/98 139/88  Pulse: 69 65  Resp: 16 18  Temp: 98.6 F (37 C) 97.9 F (36.6 C)  SpO2: 100% 99%    Physical Exam:  General:  Alert, obese, cooperative, no distress, appears stated age  Head:  Normocephalic, without obvious abnormality, atraumatic  Eyes:  Anicteric sclera, EOM's intact  Lungs:   Clear to auscultation bilaterally, respirations unlabored  Heart:  Regular rate and rhythm, S1, S2 normal  Abdomen:   Soft, mild generalized tenderness, bowel sounds active all four quadrants,  no masses,     Lab Results: Recent Labs    07/03/21 0534 07/04/21 0538  NA 140 139  K 4.1 4.0  CL 108 107  CO2 24 26  GLUCOSE 96 101*  BUN 5* 6  CREATININE 0.76 0.91  CALCIUM 9.4 9.8  MG 2.3  --   PHOS 4.1  --    Recent Labs    07/03/21 0534 07/04/21 0538  AST 527* 388*  ALT 1,334* 1,202*  ALKPHOS 259* 278*  BILITOT 2.2* 3.1*  PROT 7.8 8.1  ALBUMIN 4.2 4.4   Recent Labs    07/02/21 1230 07/03/21 0534 07/04/21 0538  WBC 6.8 6.5 6.8  NEUTROABS 5.0 4.4  --   HGB 13.9 14.6 14.5  HCT 41.7 44.0 45.3  MCV 88.7 90.7 90.4  PLT 320 279 309   Recent Labs    07/04/21 0538  LABPROT 13.1  INR 1.0      Assessment Pancreatic Mass - CT ab pelvis w contrast: possible 2.6 cm pancreatic mass suspicious for neoplasm, associated with mild dilation  of the pancreatic duct distal to the apparent mass, mild dilation of the common bile duct 8 mm and 2 adjacent enlarged retroperitoneal lymph nodes.  Also subtotal low-attenuation lesions in the liver that are not well-defined, possibly metastatic disease -MRCP: 3.1 x 2.3 cm hypoenhancing mass along the posterior medial aspect of the pancreatic head, suspicious for pancreatic neoplasm.  EUS is suggested.  Multiple small liver lesions suspicious for hepatic metastasis.  Upper abdominal/retroperitoneal lymphadenopathy suspicious for nodal metastasis -No leukocytosis -Lipase 115 -AST 388 (527)/ALT  1,202 (1334)/alk phos 278 (259) -T. bili 3.1 -Normal renal function - CA 19-9: 3 - negative hepatitis panel   Plan: Discussed case with IR for possible liver biopsy. Liver biopsy not an option due to small size of liver lesions. Will plan to proceed with EUS plus or minus ERCP. Planned for next Wednesday 6/7 at 830am as outpatient at [facility]. Discussed this with patient and family. Continue diet as tolerated. Continue to monitor LFTs [facility] will follow  Garnette Scheuermann PA-C 07/04/2021, 9:07 AM  Contact #  [phone]

## 2021-07-05 DIAGNOSIS — K769 Liver disease, unspecified: Secondary | ICD-10-CM

## 2021-07-05 LAB — IGG 4: IgG, Subclass 4: 11 mg/dL (ref 2–96)

## 2021-07-05 MED ORDER — POLYETHYLENE GLYCOL 3350 17 G PO PACK: 17.0000 g | PACK | Freq: Every day | ORAL | 0 refills | Status: DC

## 2021-07-05 MED ORDER — SENNOSIDES-DOCUSATE SODIUM 8.6-50 MG PO TABS: 2.0000 | ORAL_TABLET | Freq: Two times a day (BID) | ORAL | 0 refills | Status: DC

## 2021-07-05 MED ORDER — PANTOPRAZOLE SODIUM 40 MG PO TBEC: 40.0000 mg | DELAYED_RELEASE_TABLET | Freq: Every day | ORAL | 1 refills | Status: DC

## 2021-07-05 MED ORDER — ALBUTEROL SULFATE HFA 108 (90 BASE) MCG/ACT IN AERS: 2.0000 | INHALATION_SPRAY | RESPIRATORY_TRACT | 0 refills | Status: DC | PRN

## 2021-07-05 MED ORDER — OXYCODONE HCL 5 MG PO TABS
5.0000 mg | ORAL_TABLET | Freq: Four times a day (QID) | ORAL | 0 refills | Status: DC | PRN
Start: 2021-07-05 — End: 2021-11-02

## 2021-07-05 NOTE — Progress Notes (Signed)
[facility] Gastroenterology Progress Note  Keefe Zawistowski. 31 y.o. [birth year]  CC:  Pancreatic mass   Subjective: Patient states he is doing well today. Denies abdominal pain, nausea, or vomiting.  ROS : Review of Systems  Constitutional:  Negative for chills, fever and weight loss.  Gastrointestinal:  Negative for abdominal pain, blood in stool, constipation, diarrhea, heartburn, melena, nausea and vomiting.     Objective: Vital signs in last 24 hours: Vitals:   07/04/21 2042 07/05/21 0512  BP: (!) 141/96 117/70  Pulse: 78 75  Resp: 18 18  Temp: 98.8 F (37.1 C) 97.7 F (36.5 C)  SpO2: 100% 97%    Physical Exam:  General:  Alert, obese, cooperative, no distress, appears stated age  Head:  Normocephalic, without obvious abnormality, atraumatic  Eyes:  Anicteric sclera, EOM's intact  Lungs:   Clear to auscultation bilaterally, respirations unlabored  Heart:  Regular rate and rhythm, S1, S2 normal  Abdomen:   Soft, mild tenderness upper abdomen, bowel sounds active all four quadrants,  no masses,     Lab Results: Recent Labs    07/03/21 0534 07/04/21 0538  NA 140 139  K 4.1 4.0  CL 108 107  CO2 24 26  GLUCOSE 96 101*  BUN 5* 6  CREATININE 0.76 0.91  CALCIUM 9.4 9.8  MG 2.3  --   PHOS 4.1  --    Recent Labs    07/03/21 0534 07/04/21 0538  AST 527* 388*  ALT 1,334* 1,202*  ALKPHOS 259* 278*  BILITOT 2.2* 3.1*  PROT 7.8 8.1  ALBUMIN 4.2 4.4   Recent Labs    07/02/21 1230 07/03/21 0534 07/04/21 0538  WBC 6.8 6.5 6.8  NEUTROABS 5.0 4.4  --   HGB 13.9 14.6 14.5  HCT 41.7 44.0 45.3  MCV 88.7 90.7 90.4  PLT 320 279 309   Recent Labs    07/04/21 0538  LABPROT 13.1  INR 1.0      Assessment Pancreatic Mass - CT ab pelvis w contrast: possible 2.6 cm pancreatic mass suspicious for neoplasm, associated with mild dilation of the pancreatic duct distal to the apparent mass, mild dilation of the common bile duct 8 mm and 2 adjacent enlarged  retroperitoneal lymph nodes.  Also subtotal low-attenuation lesions in the liver that are not well-defined, possibly metastatic disease -MRCP: 3.1 x 2.3 cm hypoenhancing mass along the posterior medial aspect of the pancreatic head, suspicious for pancreatic neoplasm.  EUS is suggested.  Multiple small liver lesions suspicious for hepatic metastasis.  Upper abdominal/retroperitoneal lymphadenopathy suspicious for nodal metastasis -No leukocytosis -Lipase 115 -AST 388 (527)/ALT  1,202 (1334)/alk phos 278 (259) -T. bili 3.1 -Normal renal function - CA 19-9: 3 - negative hepatitis panel - IgG 4 pending - CMV, epstein-barr pending  Plan: Workup for autoimmune pancreatitis still pending.  We will follow-up on lab results. Continue Protonix IV 40 Mg twice daily. Continue to advance diet as tolerated. Plan for EUS next week with possible FNA. Okay to discharge from GI standpoint if able to tolerate diet.  GI will follow if patient remains hospitalized.  Amia Rynders Radford Pax PA-C 07/05/2021, 9:16 AM  Contact #  [phone]

## 2021-07-05 NOTE — Progress Notes (Signed)
  Transition of Care [facility]) Screening Note   Patient Details  Name: Leon Fischer. Date of Birth: 20-Oct-1990   Transition of Care [facility]) CM/SW Contact:    Vassie Moselle, LCSW Phone Number: 07/05/2021, 10:12 AM    Transition of Care Department [facility]) has reviewed patient and no TOC needs have been identified at this time. We will continue to monitor patient advancement through interdisciplinary progression rounds. If new patient transition needs arise, please place a TOC consult.

## 2021-07-05 NOTE — Progress Notes (Signed)
Brief Oncology Note:  Chart reviewed. Ca 19.9 normal.  CT chest with contrast showed no significant abnormality. GI planning for EUS with FNA on 07/11/2021 as outpatient. I have sent a scheduling message to the cancer center to arrange for outpatient follow-up with Dr. Irene Limbo the week of 07/16/2021 to discuss the biopsy results.  Okay to discharge from an oncology standpoint.  We will follow-up as outpatient  Mikey Bussing, DNP, AGPCNP-BC, AOCNP

## 2021-07-05 NOTE — Discharge Summary (Signed)
Physician Discharge Summary   Patient: Leon Fischer. MRN: [id number] DOB: 1991-01-06  Admit date:     07/02/2021  Discharge date: 07/05/21  Discharge Physician: Elmarie Shiley   PCP: Patient, No Pcp Per (Inactive)   Recommendations at discharge:  Follow Autoimmune work up.  Follow up with GI for EUS biopsy. Needs LFT follow up.     Discharge Diagnoses: Principal Problem:   Pancreatic mass Active Problems:   Liver lesion  Resolved Problems:   * No resolved hospital problems. *  Hospital Course: 31 year old with past medical history significant for asthma, 6 severe morbid obesity, presented with worsening intermittent diffuse abdominal pain, worse epigastric region radiated to his back.  He was found to have abnormal liver function test.  CT abdomen and pelvis revealed a pancreatic mass.  Patient was admitted for pain control.  GI and oncology consulted.   GI Is planning EUS plus or minus ERCP Wednesday 6/7 at 8:30 am as outpatient at [facility].   Assessment and Plan:   1-Pancreatic mass; multiple small liver lesions: -CT abdomen and pelvis: Possible 2.6 cm pancreatic mass suspicious for neoplasm, associated with mild dilation of the pancreatic duct distal to the apparent mass, mild dilation of the common bile duct and 2 adjacent enlarged retroperitoneal lymph nodes.  Subtle  low-attenuation lesions in the liver that are not well defined. -MRI/MRCP; 3 x 2.3 cm hypoenhancing mass along the posterior medial aspect of the pancreatic head, suspicious for pancreatic neoplasm.  Multiple small liver lesions, suspicious for hepatic metastasis.  Upper abdominal retroperitoneal lymphadenopathy. -CA-19: negative.  -Oncology consulted, recommend biopsy.  -GI planning EUS , ERCP out patient 6/07. -IgG4; pending.  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   -stable for discharge. Discharge on Oxycodone.    2-Transaminases;  Thought to be related to liver lesion.  GI  following.  Hepatitis panel negative.    Hypokalemia;  Replaced.    Constipation; continue with Bowel regimen.    Class III Obesity; Life style modification.           Consultants: GI, Oncology  Procedures performed: NONE Disposition: Home Diet recommendation:  Discharge Diet Orders (From admission, onward)     Start     Ordered   07/05/21 0000  Diet - low sodium heart healthy        07/05/21 1039           Cardiac diet DISCHARGE MEDICATION: Allergies as of 07/05/2021       Reactions   Shellfish Allergy Swelling, Shortness Of Breath   Eyes, throat swell shut.   Other         Medication List     STOP taking these medications    ibuprofen 200 MG tablet Commonly known as: ADVIL       TAKE these medications    albuterol (2.5 MG/3ML) 0.083% nebulizer solution Commonly known as: PROVENTIL Take 3 mLs (2.5 mg total) by nebulization every 6 (six) hours as needed for wheezing or shortness of breath.   albuterol 108 (90 Base) MCG/ACT inhaler Commonly known as: VENTOLIN HFA Inhale 2 puffs into the lungs every 4 (four) hours as needed for wheezing or shortness of breath.   oxyCODONE 5 MG immediate release tablet Commonly known as: Oxy IR/ROXICODONE Take 1 tablet (5 mg total) by mouth every 6 (six) hours as needed for moderate pain or breakthrough pain.   pantoprazole 40 MG tablet Commonly known as: Protonix Take 1 tablet (40 mg total) by mouth daily.  polyethylene glycol 17 g packet Commonly known as: MIRALAX / GLYCOLAX Take 17 g by mouth daily.   senna-docusate 8.6-50 MG tablet Commonly known as: Senokot-S Take 2 tablets by mouth 2 (two) times daily.        Follow-up Information     Arta Silence, MD Follow up.   Specialty: Gastroenterology Why: Appointment  6/07. at 8:30 AM Contact information: [address] [place] [address] [phone]                Discharge Exam: Danley Danker Weights   07/02/21 1204  07/02/21 1659  Weight: (!) 156.5 kg (!) 147.7 kg   General; NAD Lung; CTA  Condition at discharge: stable  The results of significant diagnostics from this hospitalization (including imaging, microbiology, ancillary and laboratory) are listed below for reference.   Imaging Studies: CT CHEST W CONTRAST  Result Date: 07/03/2021 CLINICAL DATA:  Pancreatic carcinoma staging EXAM: CT CHEST WITH CONTRAST TECHNIQUE: Multidetector CT imaging of the chest was performed during intravenous contrast administration. RADIATION DOSE REDUCTION: This exam was performed according to the departmental dose-optimization program which includes automated exposure control, adjustment of the mA and/or kV according to patient size and/or use of iterative reconstruction technique. CONTRAST:  24m OMNIPAQUE IOHEXOL 300 MG/ML  SOLN COMPARISON:  None Available. FINDINGS: Cardiovascular: There is homogeneous enhancement in thoracic aorta. There are no filling defects in the central pulmonary artery branches. Mediastinum/Nodes: No significant lymphadenopathy is seen. Lungs/Pleura: There is no focal pulmonary consolidation. There are no discrete lung nodules. Small linear densities in the right posterior costophrenic angle may suggest minimal scarring or subsegmental atelectasis. There is no pleural effusion or pneumothorax. Upper Abdomen: There is 2.6 cm ill-defined low-attenuation lesion at the junction of head and body of pancreas. There is dilation of pancreatic duct in the body and tail. There is no dilation of bile ducts. Gallbladder is distended. There is no wall thickening in gallbladder. Small space-occupying lesions seen in the liver in the previous MRI could not be distinctly identified in the CT images. Musculoskeletal: There are no focal lytic or sclerotic lesions. Degenerative changes are noted in the thoracic spine. Gynecomastia is seen in both breasts. IMPRESSION: No significant abnormality is seen in the CT scan of  chest. There is no significant lymphadenopathy. There are no discrete lung nodules. Low-density lesion seen in the pancreas is better evaluated in the MRI examination. Electronically Signed   By: PElmer PickerM.D.   On: 07/03/2021 16:08   CT ABDOMEN PELVIS W CONTRAST  Result Date: 07/02/2021 CLINICAL DATA:  Abdominal pain radiating to the back for 2 weeks, worse over the last 2 days. Some nausea vomiting. EXAM: CT ABDOMEN AND PELVIS WITH CONTRAST TECHNIQUE: Multidetector CT imaging of the abdomen and pelvis was performed using the standard protocol following bolus administration of intravenous contrast. RADIATION DOSE REDUCTION: This exam was performed according to the departmental dose-optimization program which includes automated exposure control, adjustment of the mA and/or kV according to patient size and/or use of iterative reconstruction technique. CONTRAST:  1066mOMNIPAQUE IOHEXOL 300 MG/ML  SOLN COMPARISON:  None Available. FINDINGS: Lower chest: Clear lung bases. Hepatobiliary: Liver normal in size. There are several subtle hypoattenuating liver lesions, not well-defined, most apparent at the lateral dome of segment 7, proximally 1.1 cm in size. No other liver abnormality. Gallbladder is distended, but without wall thickening or pericholecystic fluid. Common bile duct is mildly dilated, 8 mm proximally. Pancreas: There is a dystrophic appearing calcification in the  upper pancreatic head surrounded by vague area of relative hypoattenuation, which may reflect a mass. The possible mass measures 2.6 cm. No other evidence of a pancreatic mass. The pancreatic duct distal to the possible mass is dilated to 4 mm. No pancreatic inflammation. Spleen: Normal in size without focal abnormality. Adrenals/Urinary Tract: Adrenal glands are unremarkable. Kidneys are normal, without renal calculi, focal lesion, or hydronephrosis. Bladder is unremarkable. Stomach/Bowel: Stomach is within normal limits. Appendix  appears normal. No evidence of bowel wall thickening, distention, or inflammatory changes. Vascular/Lymphatic: Mildly enlarged retroperitoneal lymph nodes at the level of the upper pole the right kidney, posterior to the inferior vena cava, 1.5 x 1.4 cm in short axis ease. Mildly prominent, but subcentimeter peripancreatic and celiac lymph nodes. No other adenopathy. No vascular abnormality. Portal vein, splenic vein and superior mesenteric vein are widely patent. Reproductive: Unremarkable. Other: No abdominal wall hernia or abnormality. No abdominopelvic ascites. Musculoskeletal: No fracture or acute finding.  No bone lesion. IMPRESSION: 1. Possible 2.6 cm pancreatic mass suspicious for neoplasm, associated with mild dilation of the pancreatic duct distal to the apparent mass, mild dilation of the common bile duct and 2 adjacent enlarged retroperitoneal lymph nodes. There are also subtle low-attenuation lesions in the liver that are not well-defined, possibly metastatic disease. Recommend follow-up pancreatic MRI without and with contrast for further assessment. 2. No other abnormalities.  No acute findings. Electronically Signed   By: Lajean Manes M.D.   On: 07/02/2021 13:50   MR 3D Recon At Scanner  Result Date: 07/02/2021 CLINICAL DATA:  Abdominal pain, nausea/vomiting, possible pancreatic mass on CT EXAM: MRI ABDOMEN WITHOUT AND WITH CONTRAST (INCLUDING MRCP) TECHNIQUE: Multiplanar multisequence MR imaging of the abdomen was performed both before and after the administration of intravenous contrast. Heavily T2-weighted images of the biliary and pancreatic ducts were obtained, and three-dimensional MRCP images were rendered by post processing. CONTRAST:  28m GADAVIST GADOBUTROL 1 MMOL/ML IV SOLN COMPARISON:  CT abdomen/pelvis dated 07/02/2021 FINDINGS: Motion degraded images. Lower chest: Lung bases are clear. Hepatobiliary: Two subtle liver lesions, measuring 11 mm in segment 8 (series 4/image 9) and 12  mm in segment 5 along the gallbladder fossa (series 4/image 21). These are poorly evaluated due to motion degradation, but enhancement is favored at least within the segment 5 lesion on postcontrast subtraction imaging (series 31/image 62). Associated restricted diffusion. Three additional smaller lesions are present on diffusion imaging (series 8/images 55 and 63). These findings are concerning for metastatic disease. Gallbladder is unremarkable. No cholelithiasis or inflammatory changes. No intrahepatic or extrahepatic ductal dilatation. No choledocholithiasis is evident. Pancreas: 3.1 x 2.3 cm hypoenhancing mass along the posteromedial aspect of the pancreatic head (series 22/image 67), suspicious for pancreatic neoplasm. Associated mild dilatation of the main pancreatic duct (series 4/image 20), which abruptly ends at the level of the suspected mass (series 4/image 23). No pancreatic atrophy. Spleen:  Within normal limits. Adrenals/Urinary Tract:  Adrenal glands are within normal limits. Kidneys are within normal limits.  No hydronephrosis. Stomach/Bowel: Stomach is within normal limits. Visualized bowel is unremarkable. Vascular/Lymphatic:  No evidence of abdominal aortic aneurysm. Celiac artery, SMA, and portal vein appear distinct from the mass, without definite vascular invasion. Upper abdominal lymphadenopathy, including a 16 mm short axis portacaval node (series 4/image 21) and a 17 mm short axis right retrocaval node at the level of the right renal vein. Other:  No abdominal ascites. Musculoskeletal: No focal osseous lesions. IMPRESSION: 3.1 x 2.3 cm hypoenhancing mass along the posteromedial  aspect of the pancreatic head, suspicious for pancreatic neoplasm. EUS is suggested. Multiple small liver lesions, suspicious for hepatic metastases. Upper abdominal/retroperitoneal lymphadenopathy, suspicious for nodal metastases. Electronically Signed   By: Julian Hy M.D.   On: 07/02/2021 20:16   MR  ABDOMEN MRCP W WO CONTAST  Result Date: 07/02/2021 CLINICAL DATA:  Abdominal pain, nausea/vomiting, possible pancreatic mass on CT EXAM: MRI ABDOMEN WITHOUT AND WITH CONTRAST (INCLUDING MRCP) TECHNIQUE: Multiplanar multisequence MR imaging of the abdomen was performed both before and after the administration of intravenous contrast. Heavily T2-weighted images of the biliary and pancreatic ducts were obtained, and three-dimensional MRCP images were rendered by post processing. CONTRAST:  9m GADAVIST GADOBUTROL 1 MMOL/ML IV SOLN COMPARISON:  CT abdomen/pelvis dated 07/02/2021 FINDINGS: Motion degraded images. Lower chest: Lung bases are clear. Hepatobiliary: Two subtle liver lesions, measuring 11 mm in segment 8 (series 4/image 9) and 12 mm in segment 5 along the gallbladder fossa (series 4/image 21). These are poorly evaluated due to motion degradation, but enhancement is favored at least within the segment 5 lesion on postcontrast subtraction imaging (series 31/image 62). Associated restricted diffusion. Three additional smaller lesions are present on diffusion imaging (series 8/images 55 and 63). These findings are concerning for metastatic disease. Gallbladder is unremarkable. No cholelithiasis or inflammatory changes. No intrahepatic or extrahepatic ductal dilatation. No choledocholithiasis is evident. Pancreas: 3.1 x 2.3 cm hypoenhancing mass along the posteromedial aspect of the pancreatic head (series 22/image 67), suspicious for pancreatic neoplasm. Associated mild dilatation of the main pancreatic duct (series 4/image 20), which abruptly ends at the level of the suspected mass (series 4/image 23). No pancreatic atrophy. Spleen:  Within normal limits. Adrenals/Urinary Tract:  Adrenal glands are within normal limits. Kidneys are within normal limits.  No hydronephrosis. Stomach/Bowel: Stomach is within normal limits. Visualized bowel is unremarkable. Vascular/Lymphatic:  No evidence of abdominal aortic  aneurysm. Celiac artery, SMA, and portal vein appear distinct from the mass, without definite vascular invasion. Upper abdominal lymphadenopathy, including a 16 mm short axis portacaval node (series 4/image 21) and a 17 mm short axis right retrocaval node at the level of the right renal vein. Other:  No abdominal ascites. Musculoskeletal: No focal osseous lesions. IMPRESSION: 3.1 x 2.3 cm hypoenhancing mass along the posteromedial aspect of the pancreatic head, suspicious for pancreatic neoplasm. EUS is suggested. Multiple small liver lesions, suspicious for hepatic metastases. Upper abdominal/retroperitoneal lymphadenopathy, suspicious for nodal metastases. Electronically Signed   By: SJulian HyM.D.   On: 07/02/2021 20:16    Microbiology: No results found for this or any previous visit.  Labs: CBC: Recent Labs  Lab 07/02/21 1230 07/03/21 0534 07/04/21 0538  WBC 6.8 6.5 6.8  NEUTROABS 5.0 4.4  --   HGB 13.9 14.6 14.5  HCT 41.7 44.0 45.3  MCV 88.7 90.7 90.4  PLT 320 279 3629  Basic Metabolic Panel: Recent Labs  Lab 07/02/21 1230 07/03/21 0534 07/04/21 0538  NA 138 140 139  K 3.4* 4.1 4.0  CL 102 108 107  CO2 '26 24 26  '$ GLUCOSE 123* 96 101*  BUN 6 5* 6  CREATININE 0.93 0.76 0.91  CALCIUM 9.1 9.4 9.8  MG  --  2.3  --   PHOS  --  4.1  --    Liver Function Tests: Recent Labs  Lab 07/02/21 1230 07/03/21 0534 07/04/21 0538  AST 577* 527* 388*  ALT 1,293* 1,334* 1,202*  ALKPHOS 252* 259* 278*  BILITOT 2.2* 2.2* 3.1*  PROT 7.6 7.8  8.1  ALBUMIN 4.1 4.2 4.4   CBG: No results for input(s): GLUCAP in the last 168 hours.  Discharge time spent: greater than 30 minutes.  Signed: Elmarie Shiley, MD [facility] 07/05/2021

## 2021-07-06 LAB — CMV DNA, QUANTITATIVE, PCR
CMV DNA Quant: NEGATIVE IU/mL
Log10 CMV Qn DNA Pl: UNDETERMINED log10 IU/mL

## 2021-07-06 LAB — EPSTEIN BARR VRS(EBV DNA BY PCR): EBV DNA QN by PCR: NEGATIVE IU/mL

## 2021-07-10 ENCOUNTER — Other Ambulatory Visit: Payer: Self-pay | Admitting: Gastroenterology

## 2021-07-11 ENCOUNTER — Inpatient Hospital Stay (HOSPITAL_COMMUNITY)
Admission: AD | Admit: 2021-07-11 | Discharge: 2021-07-15 | DRG: 435 | Disposition: A | Discharge status: Home / Self Care | Payer: 59 | Attending: Internal Medicine | Admitting: Internal Medicine

## 2021-07-11 ENCOUNTER — Encounter (HOSPITAL_COMMUNITY): Payer: Self-pay | Admitting: Gastroenterology

## 2021-07-11 ENCOUNTER — Encounter (HOSPITAL_COMMUNITY)
Admission: AD | Disposition: A | Discharge status: Home / Self Care | Payer: Self-pay | Source: Home / Self Care | Attending: Internal Medicine

## 2021-07-11 ENCOUNTER — Ambulatory Visit (HOSPITAL_COMMUNITY): Payer: 59 | Admitting: Certified Registered Nurse Anesthetist

## 2021-07-11 ENCOUNTER — Observation Stay (HOSPITAL_COMMUNITY): Payer: 59

## 2021-07-11 ENCOUNTER — Other Ambulatory Visit: Payer: Self-pay

## 2021-07-11 DIAGNOSIS — K769 Liver disease, unspecified: Secondary | ICD-10-CM | POA: Diagnosis present

## 2021-07-11 DIAGNOSIS — K219 Gastro-esophageal reflux disease without esophagitis: Secondary | ICD-10-CM | POA: Diagnosis present

## 2021-07-11 DIAGNOSIS — K2970 Gastritis, unspecified, without bleeding: Secondary | ICD-10-CM | POA: Diagnosis present

## 2021-07-11 DIAGNOSIS — C250 Malignant neoplasm of head of pancreas: Principal | ICD-10-CM | POA: Diagnosis present

## 2021-07-11 DIAGNOSIS — R7989 Other specified abnormal findings of blood chemistry: Secondary | ICD-10-CM | POA: Diagnosis present

## 2021-07-11 DIAGNOSIS — R112 Nausea with vomiting, unspecified: Secondary | ICD-10-CM

## 2021-07-11 DIAGNOSIS — K8689 Other specified diseases of pancreas: Secondary | ICD-10-CM | POA: Diagnosis present

## 2021-07-11 DIAGNOSIS — K2990 Gastroduodenitis, unspecified, without bleeding: Secondary | ICD-10-CM | POA: Diagnosis present

## 2021-07-11 DIAGNOSIS — Z813 Family history of other psychoactive substance abuse and dependence: Secondary | ICD-10-CM

## 2021-07-11 DIAGNOSIS — Z79899 Other long term (current) drug therapy: Secondary | ICD-10-CM

## 2021-07-11 DIAGNOSIS — M549 Dorsalgia, unspecified: Secondary | ICD-10-CM | POA: Diagnosis not present

## 2021-07-11 DIAGNOSIS — Z91013 Allergy to seafood: Secondary | ICD-10-CM

## 2021-07-11 DIAGNOSIS — R590 Localized enlarged lymph nodes: Secondary | ICD-10-CM | POA: Diagnosis present

## 2021-07-11 DIAGNOSIS — F17200 Nicotine dependence, unspecified, uncomplicated: Secondary | ICD-10-CM | POA: Diagnosis present

## 2021-07-11 DIAGNOSIS — Z6841 Body Mass Index (BMI) 40.0 and over, adult: Secondary | ICD-10-CM

## 2021-07-11 DIAGNOSIS — J45909 Unspecified asthma, uncomplicated: Secondary | ICD-10-CM | POA: Diagnosis present

## 2021-07-11 DIAGNOSIS — K831 Obstruction of bile duct: Secondary | ICD-10-CM | POA: Diagnosis present

## 2021-07-11 LAB — COMPREHENSIVE METABOLIC PANEL
ALT: 931 U/L — ABNORMAL HIGH (ref 0–44)
AST: 314 U/L — ABNORMAL HIGH (ref 15–41)
Albumin: 4.2 g/dL (ref 3.5–5.0)
Alkaline Phosphatase: 393 U/L — ABNORMAL HIGH (ref 38–126)
Anion gap: 11 (ref 5–15)
BUN: 11 mg/dL (ref 6–20)
CO2: 24 mmol/L (ref 22–32)
Calcium: 10 mg/dL (ref 8.9–10.3)
Chloride: 105 mmol/L (ref 98–111)
Creatinine, Ser: 0.86 mg/dL (ref 0.61–1.24)
GFR, Estimated: >60 mL/min (ref >60)
Glucose, Bld: 124 mg/dL — ABNORMAL HIGH (ref 70–99)
Potassium: 3.7 mmol/L (ref 3.5–5.1)
Sodium: 140 mmol/L (ref 135–145)
Total Bilirubin: 8.4 mg/dL — ABNORMAL HIGH (ref 0.3–1.2)
Total Protein: 8.4 g/dL — ABNORMAL HIGH (ref 6.5–8.1)

## 2021-07-11 LAB — CBC WITH DIFFERENTIAL/PLATELET
Abs Immature Granulocytes: 0.04 10*3/uL (ref 0.00–0.07)
Basophils Absolute: 0 10*3/uL (ref 0.0–0.1)
Basophils Relative: 0 %
Eosinophils Absolute: 0.2 10*3/uL (ref 0.0–0.5)
Eosinophils Relative: 2 %
HCT: 45.6 % (ref 39.0–52.0)
Hemoglobin: 14.6 g/dL (ref 13.0–17.0)
Immature Granulocytes: 0 %
Lymphocytes Relative: 7 %
Lymphs Abs: 0.7 10*3/uL (ref 0.7–4.0)
MCH: 29.6 pg (ref 26.0–34.0)
MCHC: 32 g/dL (ref 30.0–36.0)
MCV: 92.3 fL (ref 80.0–100.0)
Monocytes Absolute: 0.7 10*3/uL (ref 0.1–1.0)
Monocytes Relative: 7 %
Neutro Abs: 8.9 10*3/uL — ABNORMAL HIGH (ref 1.7–7.7)
Neutrophils Relative %: 84 %
Platelets: 297 10*3/uL (ref 150–400)
RBC: 4.94 MIL/uL (ref 4.22–5.81)
RDW: 14.7 % (ref 11.5–15.5)
WBC: 10.5 10*3/uL (ref 4.0–10.5)
nRBC: 0 % (ref 0.0–0.2)

## 2021-07-11 IMAGING — CT CT ABD-PELV W/ CM
2 of 4 series · 16 of 46 positions shown, 18 images · IV contrast (OMNIPAQUE 300)
Comparison: CT and MRI [DATE]

CLINICAL DATA: Bowel obstruction suspected.

EXAM:
CT ABDOMEN AND PELVIS WITH CONTRAST
TECHNIQUE: Multidetector CT imaging of the abdomen and pelvis was performed
using the standard protocol following bolus administration of
intravenous contrast.

[Series 2: axial st · axial · 0.88mm/px · z∈[-564,-68]mm · 13 of 111 slices shown, 15 images]
[im 6/111  soft-tissue]
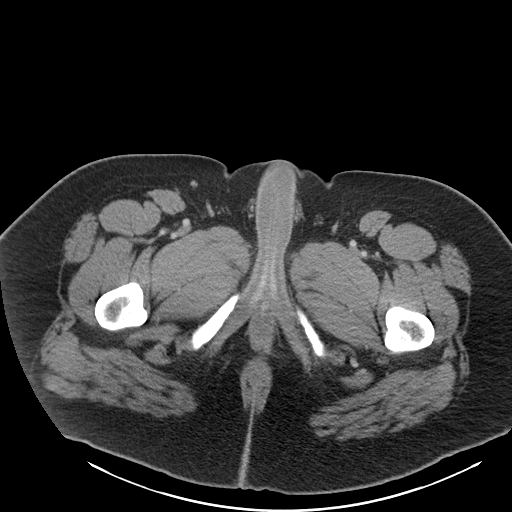
[im 6/111  bone]
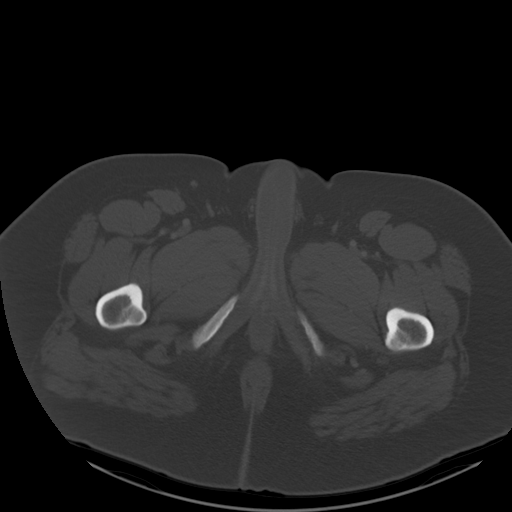
[im 18/111  soft-tissue]
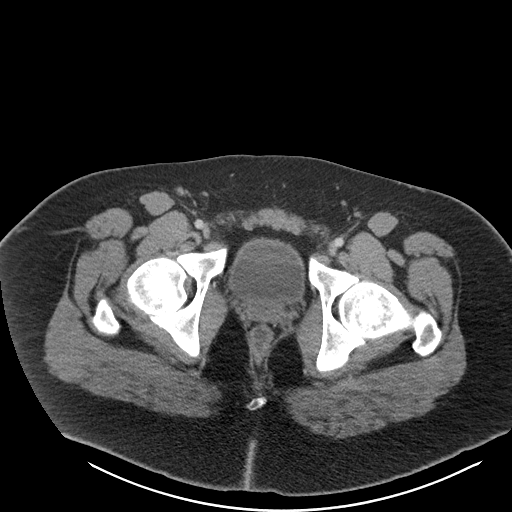
[im 24/111  soft-tissue]
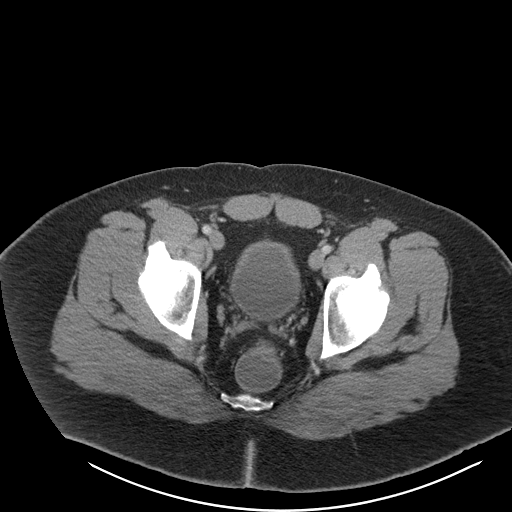
[im 29/111  soft-tissue]
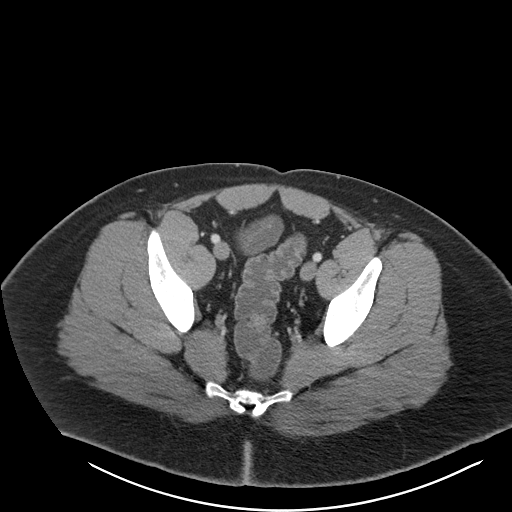
[im 41/111  soft-tissue]
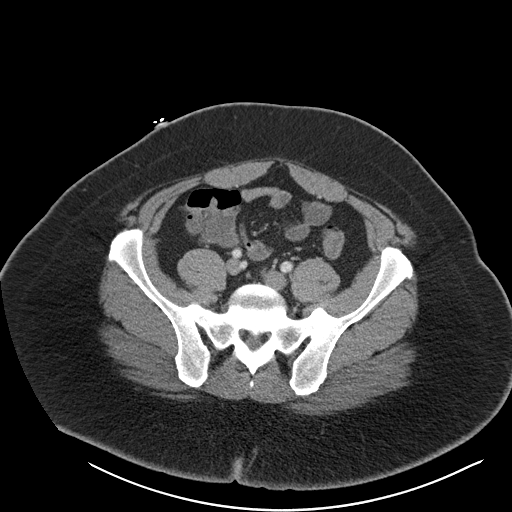
[im 47/111  soft-tissue]
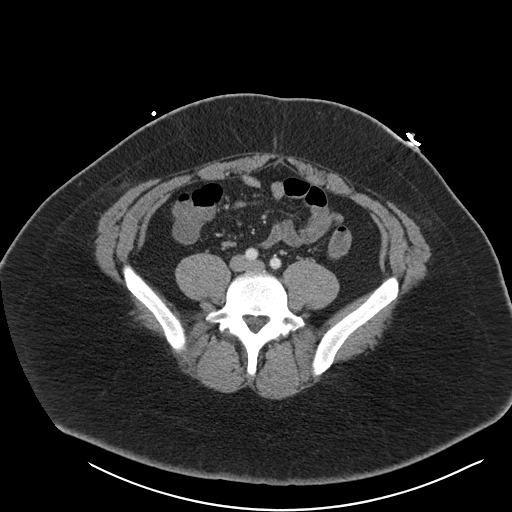
[im 58/111  soft-tissue]
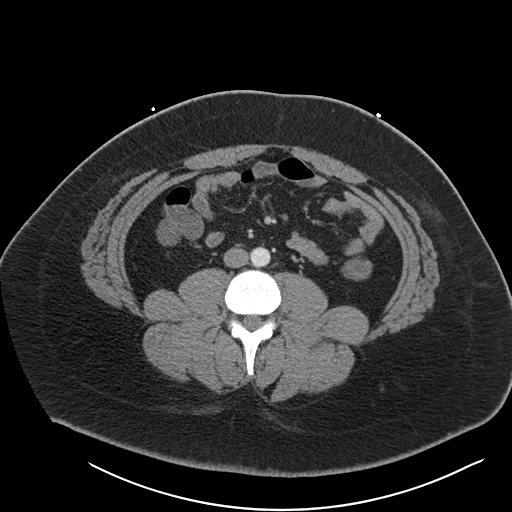
[im 64/111  soft-tissue]
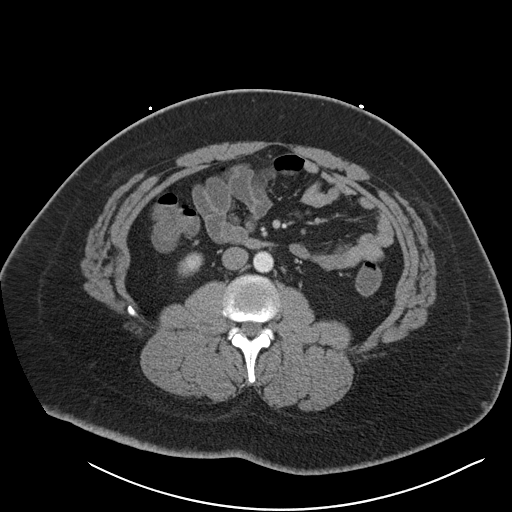
[im 70/111  soft-tissue]
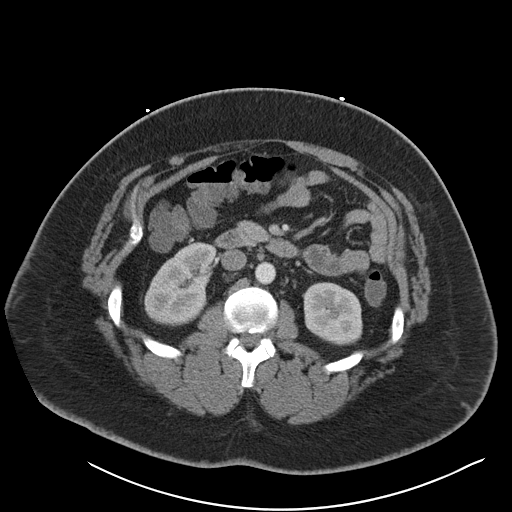
[im 70/111  bone]
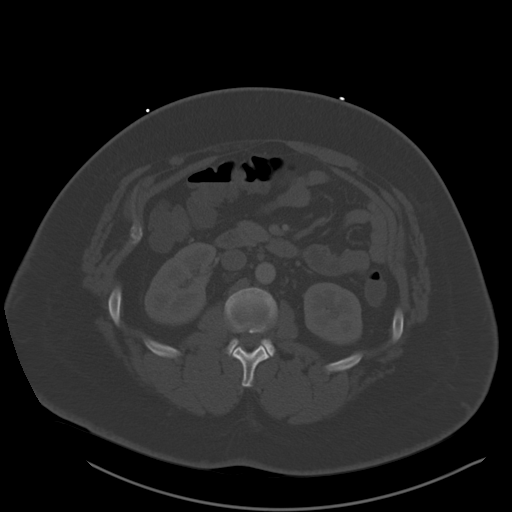
[im 82/111  soft-tissue]
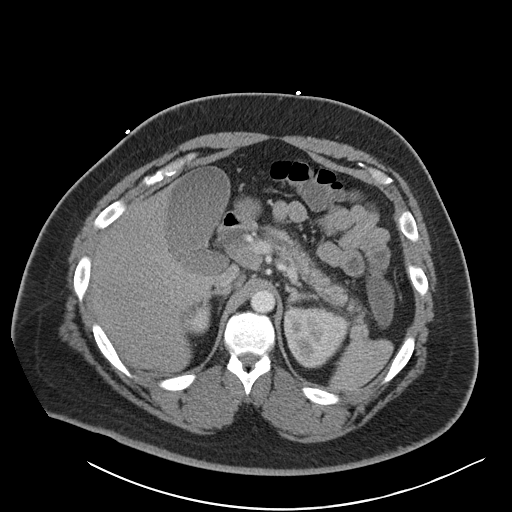
[im 87/111  soft-tissue]
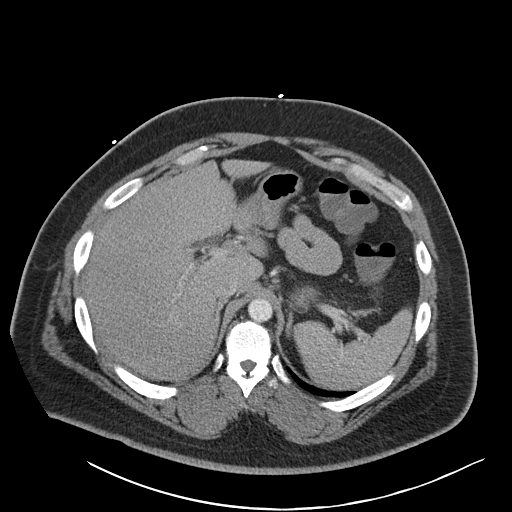
[im 93/111  soft-tissue]
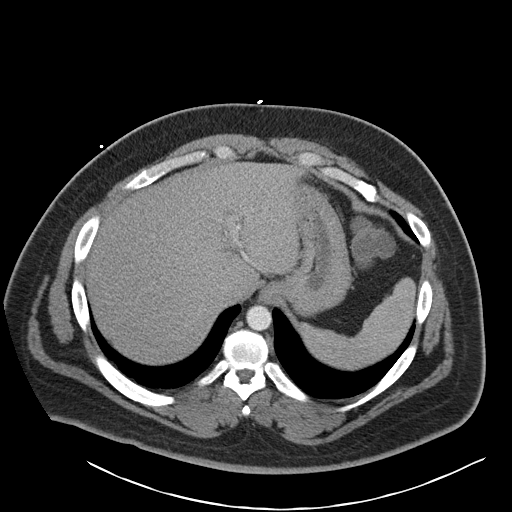
[im 105/111  soft-tissue]
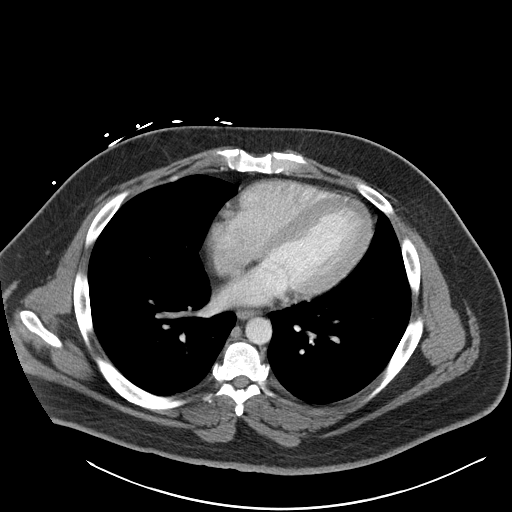

[Series 4: coronal st · coronal · 0.97mm/px · 3 of 173 slices shown]
[im 58/173  soft-tissue]
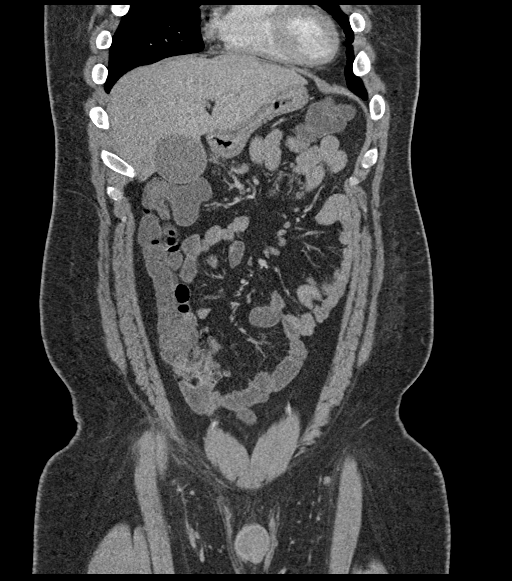
[im 77/173  soft-tissue]
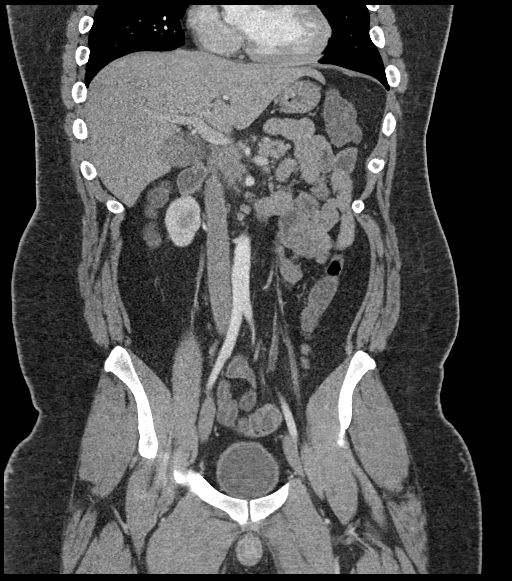
[im 96/173  soft-tissue]
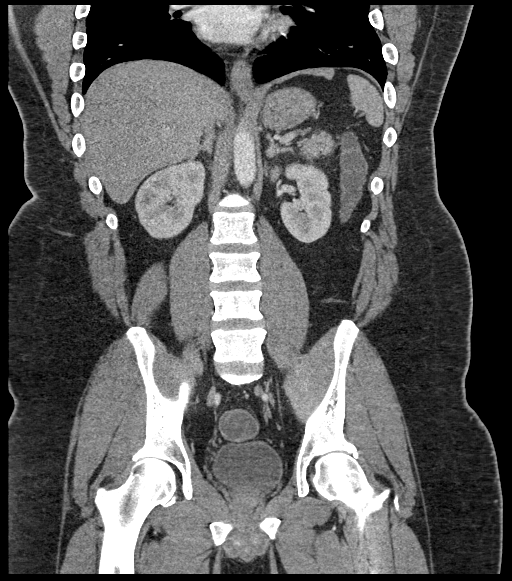

[16 of 46 positions shown; findings below may reference images not displayed]

RADIATION DOSE REDUCTION: This exam was performed according to the
departmental dose-optimization program which includes automated
exposure control, adjustment of the mA and/or kV according to
patient size and/or use of iterative reconstruction technique.

CONTRAST:  100mL OMNIPAQUE IOHEXOL 300 MG/ML  SOLN
FINDINGS: Lower chest: No acute abnormality.

Hepatobiliary: No significant interval change in the subtle
hypodense hepatic lesions measuring up to 1.3 cm in the right lobe
of the liver on image [DATE]. Gallbladder is unremarkable. Similar
mild prominence of the biliary tree without dilation.

Pancreas: No significant interval change in the ill-defined
hypoenhancing masslike area along the posterior pancreatic head
which measures approximally 2.7 cm on image 32/2. Similar mild
pancreatic ductal dilation without significant pancreatic atrophy.

Spleen: No splenomegaly or focal splenic lesion.

Adrenals/Urinary Tract: Bilateral adrenal glands appear normal. No
hydronephrosis. Kidneys demonstrate symmetric enhancement. No
suspicious renal mass.

Stomach/Bowel: Stomach is unremarkable for degree of distension. No
pathologic dilation of small or large bowel. Terminal ileum appears
normal. Normal appendix. Gas fluid levels throughout the colon
suggestive of diarrheal illness.

Vascular/Lymphatic: Normal caliber abdominal aorta. Enlarged
retroperitoneal/upper abdominal lymph nodes are again seen an
unchanged for instance an aortocaval lymph node measuring 15 mm in
short axis on image 34/2.

Reproductive: Prostate is unremarkable.

Other: No significant abdominopelvic free fluid.

Musculoskeletal: No aggressive lytic or blastic lesion of bone.
IMPRESSION: 1. Gas fluid levels throughout the colon suggestive of diarrheal
illness. No evidence of bowel obstruction.
2. No significant interval change in the hypodense/hypoenhancing
masslike area in the pancreatic head with associated ductal
dilation.
3. Stable enlarged upper abdominal lymph nodes and subtle hypodense
hepatic lesions.

## 2021-07-11 SURGERY — UPPER ESOPHAGEAL ENDOSCOPIC ULTRASOUND (EUS)
Anesthesia: Monitor Anesthesia Care | Laterality: Bilateral

## 2021-07-11 SURGERY — CANCELLED PROCEDURE
Anesthesia: Monitor Anesthesia Care

## 2021-07-11 MED ORDER — SODIUM CHLORIDE 0.9 % IV SOLN: INTRAVENOUS | Status: DC

## 2021-07-11 MED ORDER — ONDANSETRON HCL 4 MG/2ML IJ SOLN
4.0000 mg | Freq: Four times a day (QID) | INTRAMUSCULAR | Status: DC | PRN
  Administered 2021-07-13 (×2): 4 mg via INTRAVENOUS
  Filled 2021-07-11: qty 2

## 2021-07-11 MED ORDER — PANTOPRAZOLE SODIUM 40 MG IV SOLR
40.0000 mg | Freq: Every day | INTRAVENOUS | Status: DC
  Administered 2021-07-11 – 2021-07-14 (×4): 40 mg via INTRAVENOUS
  Filled 2021-07-11 (×4): qty 10

## 2021-07-11 MED ORDER — LACTATED RINGERS IV SOLN: INTRAVENOUS | Status: DC

## 2021-07-11 MED ORDER — SODIUM CHLORIDE (PF) 0.9 % IJ SOLN
INTRAMUSCULAR | Status: AC
  Filled 2021-07-11: qty 50

## 2021-07-11 MED ORDER — IOHEXOL 300 MG/ML  SOLN
100.0000 mL | Freq: Once | INTRAMUSCULAR | Status: AC | PRN
  Administered 2021-07-11: 100 mL via INTRAVENOUS

## 2021-07-11 MED ORDER — PROPOFOL 1000 MG/100ML IV EMUL
INTRAVENOUS | Status: AC
  Filled 2021-07-11: qty 100

## 2021-07-11 MED ORDER — ALBUTEROL SULFATE (2.5 MG/3ML) 0.083% IN NEBU: 3.0000 mL | INHALATION_SOLUTION | RESPIRATORY_TRACT | Status: DC | PRN

## 2021-07-11 NOTE — Plan of Care (Signed)

## 2021-07-11 NOTE — H&P (View-Only) (Signed)
[facility] Consultation Note  Referring Provider: [facility] Primary Care Physician:  Patient, No Pcp Per (Inactive)  Reason for Consultation:  Jaundice, nausea/vomiting, elevated LFTs  HPI: Leon Fischer. is a 31 y.o. male seen last week for above problems.  Found to have pancreatic mass with liver lesions and PD dilatation.  LFTs elevated at that time but no dilated biliary tree.  Seen today for outpatient EUS evaluation.  At time of my pre-procedural evaluation, patient had copious emesis of strawberry-appearing thick liquid (was not bloody), which he reports consuming well before midnight yesterday.  He remains nauseated.  Reports having some episodes like this last week.  Early satiety?  Anesthesia is understandably concerned about patient undergoing non-urgent endoscopy (even if intubated) at this time.   Past Medical History:  Diagnosis Date   Asthma     History reviewed. No pertinent surgical history.  Prior to Admission medications   Medication Sig Start Date End Date Taking? Authorizing Provider  pantoprazole (PROTONIX) 40 MG tablet Take 1 tablet (40 mg total) by mouth daily. 07/05/21 07/05/22 Yes Regalado, Belkys A, MD  polyethylene glycol (MIRALAX / GLYCOLAX) 17 g packet Take 17 g by mouth daily. 07/05/21  Yes Regalado, Belkys A, MD  senna-docusate (SENOKOT-S) 8.6-50 MG tablet Take 2 tablets by mouth 2 (two) times daily. 07/05/21  Yes Regalado, Belkys A, MD  albuterol (PROVENTIL) (2.5 MG/3ML) 0.083% nebulizer solution Take 3 mLs (2.5 mg total) by nebulization every 6 (six) hours as needed for wheezing or shortness of breath. Patient not taking: Reported on 07/02/2021 12/20/18   Robinson, [place] N, PA-C  albuterol (VENTOLIN HFA) 108 (90 Base) MCG/ACT inhaler Inhale 2 puffs into the lungs every 4 (four) hours as needed for wheezing or shortness of breath. 07/05/21   Regalado, Belkys A, MD  oxyCODONE (OXY IR/ROXICODONE) 5 MG immediate release tablet Take 1  tablet (5 mg total) by mouth every 6 (six) hours as needed for moderate pain or breakthrough pain. 07/05/21   Regalado, Jerald Kief A, MD    Current Facility-Administered Medications  Medication Dose Route Frequency Provider Last Rate Last Admin   0.9 %  sodium chloride infusion   Intravenous Continuous Arta Silence, MD       lactated ringers infusion   Intravenous Continuous Arta Silence, MD 10 mL/hr at 07/11/21 0745 Restarted at 07/11/21 0805    Allergies as of 07/03/2021 - Review Complete 07/03/2021  Allergen Reaction Noted   Shellfish allergy Swelling and Shortness Of Breath 05/28/2011   Other  08/10/2012    Family History  Problem Relation Age of Onset   Drug abuse Father     Social History   Socioeconomic History   Marital status: Single    Spouse name: Not on file   Number of children: 0   Years of education: Not on file   Highest education level: Some college, no degree  Occupational History   Not on file  Tobacco Use   Smoking status: Some Days   Smokeless tobacco: Never  Vaping Use   Vaping Use: Never used  Substance and Sexual Activity   Alcohol use: Yes    Comment: social   Drug use: No   Sexual activity: Never  Other Topics Concern   Not on file  Social History Narrative   Not on file   Social Determinants of Health   Financial Resource Strain: Not on file  Food Insecurity: Not on file  Transportation Needs: Not on file  Physical Activity: Not on  file  Stress: Not on file  Social Connections: Not on file  Intimate Partner Violence: Not on file    Review of Systems: As per HPI, all others negative  Physical Exam: Vital signs in last 24 hours: Temp:  [98.7 F (37.1 C)] 98.7 F (37.1 C) (06/07 0732) Pulse Rate:  [95] 95 (06/07 0732) Resp:  [13] 13 (06/07 0732) BP: (145)/(97) 145/97 (06/07 0732) SpO2:  [99 %] 99 % (06/07 0732) Weight:  [147.4 kg] 147.4 kg (06/07 0732)   General:   Overweight, nauseated, non-toxic, jaundiced. Head:   Normocephalic and atraumatic. Eyes:  Sclera icteric   Conjunctiva pink. Ears:  Normal auditory acuity. Nose:  No deformity, discharge,  or lesions. Mouth:  No deformity or lesions.  Oropharynx pink & moist. Neck:  Thick but supple; no masses or thyromegaly. Lungs:  Clear throughout to auscultation.   No wheezes, crackles, or rhonchi. No acute distress. Heart:  Regular rate and rhythm; no murmurs, clicks, rubs,  or gallops. Abdomen:  Mild distended and tympanic, No masses, hepatosplenomegaly or hernias noted. Normal bowel sounds, without guarding, and without rebound.     Msk:  Symmetrical without gross deformities. Normal posture. Pulses:  Normal pulses noted. Extremities:  Without clubbing or edema. Neurologic:  Alert and  oriented x4;  grossly normal neurologically. Skin:  Intact without significant lesions or rashes. Psych:  Alert and cooperative. Normal mood and affect.   Lab Results: No results for input(s): WBC, HGB, HCT, PLT in the last 72 hours. BMET No results for input(s): NA, K, CL, CO2, GLUCOSE, BUN, CREATININE, CALCIUM in the last 72 hours. LFT No results for input(s): PROT, ALBUMIN, AST, ALT, ALKPHOS, BILITOT, BILIDIR, IBILI in the last 72 hours. PT/INR No results for input(s): LABPROT, INR in the last 72 hours.  Studies/Results: No results found.  Impression:   Elevated LFTs.  Amount of liver lesions noted seem disproportionately small in comparison to degree of LFT elevation last week, despite reportedly normal-caliber bile duct. Pancreatic mass.  Concern for pancreatic adenocarcinoma but with normal Ca 19-9 last week. Nausea/vomiting.  Recurrent but per patient before today hadn't occurred since last week.  In conjunction with known history of pancreatic mass and some abdominal distention on exam (though patient reports no significant perceived change), there is concern for gastric outlet obstruction from the pancreatic lesion.  Plan:   Admit to hospital [facility])  for management of nausea/vomiting.  IV fluids and scheduled antiemetics. CT scan to assess for biliary dilatation, gastric distention (in which case would do NG tube), bowel obstruction. Hold off EUS today.  Will need EGD +/- ERCP +/- EUS this admission. NPO for now. [facility] will follow.   LOS: 0 days   Berdine Rasmusson M  07/11/2021, 8:42 AM  Cell [phone] If no answer or after 5 PM call [phone]

## 2021-07-11 NOTE — Anesthesia Preprocedure Evaluation (Addendum)
Anesthesia Evaluation  Patient identified by MRN, date of birth, ID band Patient awake    Reviewed: Allergy & Precautions, NPO status , Patient's Chart, lab work & pertinent test results  Airway Mallampati: III  TM Distance: >3 FB Neck ROM: Full    Dental   Pulmonary asthma , Current Smoker,    breath sounds clear to auscultation       Cardiovascular negative cardio ROS   Rhythm:Regular Rate:Normal     Neuro/Psych negative neurological ROS     GI/Hepatic Neg liver ROS, Pancreatic mass   Endo/Other  negative endocrine ROSMorbid obesity  Renal/GU negative Renal ROS     Musculoskeletal   Abdominal   Peds  Hematology negative hematology ROS (+)   Anesthesia Other Findings   Reproductive/Obstetrics                             Anesthesia Physical Anesthesia Plan  ASA: 3  Anesthesia Plan: MAC   Post-op Pain Management: Minimal or no pain anticipated   Induction:   PONV Risk Score and Plan: 0 and Propofol infusion and Ondansetron  Airway Management Planned: Natural Airway and Nasal Cannula  Additional Equipment:   Intra-op Plan:   Post-operative Plan:   Informed Consent: I have reviewed the patients History and Physical, chart, labs and discussed the procedure including the risks, benefits and alternatives for the proposed anesthesia with the patient or authorized representative who has indicated his/her understanding and acceptance.       Plan Discussed with:   Anesthesia Plan Comments:         Anesthesia Quick Evaluation

## 2021-07-11 NOTE — H&P (Signed)
History and Physical    Patient: Leon Fischer. YKZ:[id number] DOB: 11/14/1990 DOA: 07/11/2021 DOS: the patient was seen and examined on 07/11/2021 PCP: Patient, No Pcp Per (Inactive)  Patient coming from:  Endoscopy  Chief Complaint: Nausea/Vomiting.  HPI: Leon Fischer. is a 31 y.o. male with medical history significant of asthma, morbid obesity, pancreatic mass/liver lesions. Presenting with N/V. He presented to [facility] service this morning for a scheduled EUS/ERCP for recently discovered pancreatic mass/liver lesions. Prior to the procedure, he had an episode of significant non-bloody emesis. He reports that this is his first episode of N/V since leaving the hospital last week. He has not had any abdominal pain, fever, or diarrhea. His last BM was yesterday. He ate last night around 2200hrs without incident. He denies any other aggravating or alleviating factors.  D/t this vomiting episode, his procedure was canceled. [facility] was asked to admit for ON monitoring.   Review of Systems: As mentioned in the history of present illness. All other systems reviewed and are negative. Past Medical History:  Diagnosis Date   Asthma    History reviewed. No pertinent surgical history. Social History:  reports that he has been smoking. He has never used smokeless tobacco. He reports current alcohol use. He reports that he does not use drugs.  Allergies  Allergen Reactions   Shellfish Allergy Swelling and Shortness Of Breath    Eyes, throat swell shut.    Other     Family History  Problem Relation Age of Onset   Drug abuse Father     Prior to Admission medications   Medication Sig Start Date End Date Taking? Authorizing Provider  pantoprazole (PROTONIX) 40 MG tablet Take 1 tablet (40 mg total) by mouth daily. 07/05/21 07/05/22 Yes Regalado, Belkys A, MD  polyethylene glycol (MIRALAX / GLYCOLAX) 17 g packet Take 17 g by mouth daily. 07/05/21  Yes Regalado, Belkys A, MD   senna-docusate (SENOKOT-S) 8.6-50 MG tablet Take 2 tablets by mouth 2 (two) times daily. 07/05/21  Yes Regalado, Belkys A, MD  albuterol (PROVENTIL) (2.5 MG/3ML) 0.083% nebulizer solution Take 3 mLs (2.5 mg total) by nebulization every 6 (six) hours as needed for wheezing or shortness of breath. Patient not taking: Reported on 07/02/2021 12/20/18   Robinson, [place] N, PA-C  albuterol (VENTOLIN HFA) 108 (90 Base) MCG/ACT inhaler Inhale 2 puffs into the lungs every 4 (four) hours as needed for wheezing or shortness of breath. 07/05/21   Regalado, Belkys A, MD  oxyCODONE (OXY IR/ROXICODONE) 5 MG immediate release tablet Take 1 tablet (5 mg total) by mouth every 6 (six) hours as needed for moderate pain or breakthrough pain. 07/05/21   Regalado, Cassie Freer, MD    Physical Exam: Vitals:   07/11/21 0732  BP: (!) 145/97  Pulse: 95  Resp: 13  Temp: 98.7 F (37.1 C)  TempSrc: Oral  SpO2: 99%  Weight: (!) 147.4 kg  Height: 6' (1.829 m)   General: 31 y.o. male resting in bed in NAD Eyes: PERRL ENMT: Nares patent w/o discharge, orophaynx clear, dentition normal, ears w/o discharge/lesions/ulcers Neck: Supple, trachea midline Cardiovascular: RRR, +S1, S2, no m/g/r, equal pulses throughout Respiratory: CTABL, no w/r/r, normal WOB GI: BS+, NDNT, obese, no masses noted, no organomegaly noted MSK: No e/c/c Neuro: A&O x 3, no focal deficits Psyc: Appropriate interaction and affect, calm/cooperative  Data Reviewed:  There are no new results to review at this time. Labs ordered. No new imaging at this time.  Assessment and Plan: Pancreatic mass Multiple liver lesions Elevated LFTs     - place in obs, tele     - recently admitted for this and discharged on 6/1 with plans for outpt EUS/ERCP that was scheduled to proceed this morning; however, prior to procedure he has a significant amount of emesis     - he reports that this is his first about of N/V since discharge, he's chronically constipated, but  had 2 BMs since discharge     - emesis was not bloody     - he is currently not nauseous and has no abdominal pain     - right now, keep NPO     - will check CT ab/pelvis w/ contrast     - may also need RUQ Ab [place]     - [facility] onboard, appreciate assistance, current thought is to calm his down and attempt procedure in the AM     - anti-emetics, fluids, monitor   Nausea/vomiting     - anti-emetics, fluids, monitor  Morbid obesity     - counsel on diet/lifestyle changes; follow up outpt  Hx of asthma     - PRN nebs  Advance Care Planning:   Code Status: FULL  Consults: [facility] (Dr. Paulita Fujita)  Family Communication: w/ mother at bedside  Severity of Illness: The appropriate patient status for this patient is OBSERVATION. Observation status is judged to be reasonable and necessary in order to provide the required intensity of service to ensure the patient's safety. The patient's presenting symptoms, physical exam findings, and initial radiographic and laboratory data in the context of their medical condition is felt to place them at decreased risk for further clinical deterioration. Furthermore, it is anticipated that the patient will be medically stable for discharge from the hospital within 2 midnights of admission.   Author: Jonnie Finner, DO 07/11/2021 8:52 AM  For on call review [email or web address].

## 2021-07-11 NOTE — ED Provider Notes (Signed)
Patient presents to the ED after going to endoscopy for his scheduled EUS/ERCP for recently discovered pancreatic mass/liver lesions.  He developed nausea and vomiting and procedure was canceled.  Hospitalist group was contacted to admit the patient.  However there are no beds in the hospital.  Discussed with Dr. Marylyn Ishihara who is aware and has already seen the patient.  Patient is stable and has normal vital signs.  He denies any abdominal pain or nausea. Abdomen is soft and nontender.  Stable for admission per hospitalist plan. CT scan has been ordered to assess for obstruction.   Ezequiel Essex, MD 07/11/21 [phone]

## 2021-07-11 NOTE — Progress Notes (Signed)
[facility] Progress Note  Smiley Michael Gillock Jr. 31 y.o. 06/22/1990  CC:  Jaundice, nausea/vomiting, elevated LFTs   Subjective: Patient resting comfortably in bed, family at bedside. Denies concerns at this time. No nausea, vomiting, abdominal pain, fever, chills. States he has had a normal bowel movement since admission without hematochezia or melena.   ROS : Review of Systems  Constitutional:  Negative for chills and fever.  Gastrointestinal:  Negative for abdominal pain, blood in stool, constipation, diarrhea, heartburn, melena, nausea and vomiting.  Genitourinary:  Negative for dysuria and urgency.  Musculoskeletal:  Negative for falls.     Objective: Vital signs in last 24 hours: Vitals:   07/11/21 1248 07/11/21 1311  BP:  (!) 142/97  Pulse:  95  Resp:  16  Temp: 98.8 F (37.1 C) 98.3 F (36.8 C)  SpO2:  100%    Physical Exam:  General:  Alert, cooperative, no distress, appears stated age  Head:  Normocephalic, without obvious abnormality, atraumatic  Eyes:  Scleral icterus, EOMs intact  Lungs:   Clear to auscultation bilaterally, respirations unlabored  Heart:  Regular rate and rhythm, S1, S2 normal  Abdomen:   Soft, non-tender, bowel sounds active all four quadrants,  no masses,   Extremities: Extremities normal, atraumatic, no  edema  Pulses: 2+ and symmetric    Lab Results: Recent Labs    07/11/21 0930  NA 140  K 3.7  CL 105  CO2 24  GLUCOSE 124*  BUN 11  CREATININE 0.86  CALCIUM 10.0   Recent Labs    07/11/21 0930  AST 314*  ALT 931*  ALKPHOS 393*  BILITOT 8.4*  PROT 8.4*  ALBUMIN 4.2   Recent Labs    07/11/21 0930  WBC 10.5  NEUTROABS 8.9*  HGB 14.6  HCT 45.6  MCV 92.3  PLT 297   No results for input(s): LABPROT, INR in the last 72 hours.    Assessment Elevated LFTs, pancreatic mass - AST 314, ALT 931, Alk Phos 393, Tbili 8.4 - Normal kidney function - No leukocytosis or anemia - MRCP 07/02/2021 showing 3.1 x  2.3 cm hypoenhancing mass along the posteromedial aspect of the pancreatic head, suspicious for pancreatic neoplasm. EUS is suggested. Multiple small liver lesions, suspicious for hepatic metastases. Upper abdominal/retroperitoneal lymphadenopathy, suspicious for nodal metastases.  Plan: EUS cancelled today due to nausea/vomiting. Rescheduled for 6/9 with possible ERCP.  Patient denies nausea, vomiting, or abdominal pain at this time.  Okay to have clear liquid diet for now.  Continue supportive care.  Continue to trend LFTs. [facility] will follow.    E  PA-C 07/11/2021, 4:24 PM  Contact #  [phone]  

## 2021-07-11 NOTE — Progress Notes (Signed)
Pt in endo awaiting procedure and starting vomiting while dr. Paulita Fujita was talking to him. Dr. Paulita Fujita cancelled procedure due to vomiting large amounts. Dr. Paulita Fujita wants patient admitted to hospital.Dr. Marylyn Ishihara with hospitalist by to see patient and wrote orders for admission. Bed control called and there are currently no rooms in the hospital at this time. ED charge nurse Abby is able to take patient into ED 11 while patient is awaiting a room

## 2021-07-11 NOTE — Consult Note (Signed)
[facility] Consultation Note  Referring Provider: [facility] Primary Care Physician:  Patient, No Pcp Per (Inactive)  Reason for Consultation:  Jaundice, nausea/vomiting, elevated LFTs  HPI: Leon Fischer. is a 31 y.o. male seen last week for above problems.  Found to have pancreatic mass with liver lesions and PD dilatation.  LFTs elevated at that time but no dilated biliary tree.  Seen today for outpatient EUS evaluation.  At time of my pre-procedural evaluation, patient had copious emesis of strawberry-appearing thick liquid (was not bloody), which he reports consuming well before midnight yesterday.  He remains nauseated.  Reports having some episodes like this last week.  Early satiety?  Anesthesia is understandably concerned about patient undergoing non-urgent endoscopy (even if intubated) at this time.   Past Medical History:  Diagnosis Date   Asthma     History reviewed. No pertinent surgical history.  Prior to Admission medications   Medication Sig Start Date End Date Taking? Authorizing Provider  pantoprazole (PROTONIX) 40 MG tablet Take 1 tablet (40 mg total) by mouth daily. 07/05/21 07/05/22 Yes Regalado, Belkys A, MD  polyethylene glycol (MIRALAX / GLYCOLAX) 17 g packet Take 17 g by mouth daily. 07/05/21  Yes Regalado, Belkys A, MD  senna-docusate (SENOKOT-S) 8.6-50 MG tablet Take 2 tablets by mouth 2 (two) times daily. 07/05/21  Yes Regalado, Belkys A, MD  albuterol (PROVENTIL) (2.5 MG/3ML) 0.083% nebulizer solution Take 3 mLs (2.5 mg total) by nebulization every 6 (six) hours as needed for wheezing or shortness of breath. Patient not taking: Reported on 07/02/2021 12/20/18   Robinson, [place] N, PA-C  albuterol (VENTOLIN HFA) 108 (90 Base) MCG/ACT inhaler Inhale 2 puffs into the lungs every 4 (four) hours as needed for wheezing or shortness of breath. 07/05/21   Regalado, Belkys A, MD  oxyCODONE (OXY IR/ROXICODONE) 5 MG immediate release tablet Take 1  tablet (5 mg total) by mouth every 6 (six) hours as needed for moderate pain or breakthrough pain. 07/05/21   Regalado, Jerald Kief A, MD    Current Facility-Administered Medications  Medication Dose Route Frequency Provider Last Rate Last Admin   0.9 %  sodium chloride infusion   Intravenous Continuous Arta Silence, MD       lactated ringers infusion   Intravenous Continuous Arta Silence, MD 10 mL/hr at 07/11/21 0745 Restarted at 07/11/21 0805    Allergies as of 07/03/2021 - Review Complete 07/03/2021  Allergen Reaction Noted   Shellfish allergy Swelling and Shortness Of Breath 05/28/2011   Other  08/10/2012    Family History  Problem Relation Age of Onset   Drug abuse Father     Social History   Socioeconomic History   Marital status: Single    Spouse name: Not on file   Number of children: 0   Years of education: Not on file   Highest education level: Some college, no degree  Occupational History   Not on file  Tobacco Use   Smoking status: Some Days   Smokeless tobacco: Never  Vaping Use   Vaping Use: Never used  Substance and Sexual Activity   Alcohol use: Yes    Comment: social   Drug use: No   Sexual activity: Never  Other Topics Concern   Not on file  Social History Narrative   Not on file   Social Determinants of Health   Financial Resource Strain: Not on file  Food Insecurity: Not on file  Transportation Needs: Not on file  Physical Activity: Not on  file  Stress: Not on file  Social Connections: Not on file  Intimate Partner Violence: Not on file    Review of Systems: As per HPI, all others negative  Physical Exam: Vital signs in last 24 hours: Temp:  [98.7 F (37.1 C)] 98.7 F (37.1 C) (06/07 0732) Pulse Rate:  [95] 95 (06/07 0732) Resp:  [13] 13 (06/07 0732) BP: (145)/(97) 145/97 (06/07 0732) SpO2:  [99 %] 99 % (06/07 0732) Weight:  [147.4 kg] 147.4 kg (06/07 0732)   General:   Overweight, nauseated, non-toxic, jaundiced. Head:   Normocephalic and atraumatic. Eyes:  Sclera icteric   Conjunctiva pink. Ears:  Normal auditory acuity. Nose:  No deformity, discharge,  or lesions. Mouth:  No deformity or lesions.  Oropharynx pink & moist. Neck:  Thick but supple; no masses or thyromegaly. Lungs:  Clear throughout to auscultation.   No wheezes, crackles, or rhonchi. No acute distress. Heart:  Regular rate and rhythm; no murmurs, clicks, rubs,  or gallops. Abdomen:  Mild distended and tympanic, No masses, hepatosplenomegaly or hernias noted. Normal bowel sounds, without guarding, and without rebound.     Msk:  Symmetrical without gross deformities. Normal posture. Pulses:  Normal pulses noted. Extremities:  Without clubbing or edema. Neurologic:  Alert and  oriented x4;  grossly normal neurologically. Skin:  Intact without significant lesions or rashes. Psych:  Alert and cooperative. Normal mood and affect.   Lab Results: No results for input(s): WBC, HGB, HCT, PLT in the last 72 hours. BMET No results for input(s): NA, K, CL, CO2, GLUCOSE, BUN, CREATININE, CALCIUM in the last 72 hours. LFT No results for input(s): PROT, ALBUMIN, AST, ALT, ALKPHOS, BILITOT, BILIDIR, IBILI in the last 72 hours. PT/INR No results for input(s): LABPROT, INR in the last 72 hours.  Studies/Results: No results found.  Impression:   Elevated LFTs.  Amount of liver lesions noted seem disproportionately small in comparison to degree of LFT elevation last week, despite reportedly normal-caliber bile duct. Pancreatic mass.  Concern for pancreatic adenocarcinoma but with normal Ca 19-9 last week. Nausea/vomiting.  Recurrent but per patient before today hadn't occurred since last week.  In conjunction with known history of pancreatic mass and some abdominal distention on exam (though patient reports no significant perceived change), there is concern for gastric outlet obstruction from the pancreatic lesion.  Plan:   Admit to hospital [facility])  for management of nausea/vomiting.  IV fluids and scheduled antiemetics. CT scan to assess for biliary dilatation, gastric distention (in which case would do NG tube), bowel obstruction. Hold off EUS today.  Will need EGD +/- ERCP +/- EUS this admission. NPO for now. [facility] will follow.   LOS: 0 days   Yasser Hepp M  07/11/2021, 8:42 AM  Cell [phone] If no answer or after 5 PM call [phone]

## 2021-07-12 DIAGNOSIS — R112 Nausea with vomiting, unspecified: Secondary | ICD-10-CM | POA: Diagnosis not present

## 2021-07-12 LAB — COMPREHENSIVE METABOLIC PANEL
ALT: 835 U/L — ABNORMAL HIGH (ref 0–44)
AST: 296 U/L — ABNORMAL HIGH (ref 15–41)
Albumin: 3.8 g/dL (ref 3.5–5.0)
Alkaline Phosphatase: 345 U/L — ABNORMAL HIGH (ref 38–126)
Anion gap: 9 (ref 5–15)
BUN: 10 mg/dL (ref 6–20)
CO2: 27 mmol/L (ref 22–32)
Calcium: 9.5 mg/dL (ref 8.9–10.3)
Chloride: 103 mmol/L (ref 98–111)
Creatinine, Ser: 0.77 mg/dL (ref 0.61–1.24)
GFR, Estimated: >60 mL/min (ref >60)
Glucose, Bld: 92 mg/dL (ref 70–99)
Potassium: 4 mmol/L (ref 3.5–5.1)
Sodium: 139 mmol/L (ref 135–145)
Total Bilirubin: 7.1 mg/dL — ABNORMAL HIGH (ref 0.3–1.2)
Total Protein: 7.4 g/dL (ref 6.5–8.1)

## 2021-07-12 LAB — CBC
HCT: 40.2 % (ref 39.0–52.0)
Hemoglobin: 13 g/dL (ref 13.0–17.0)
MCH: 29 pg (ref 26.0–34.0)
MCHC: 32.3 g/dL (ref 30.0–36.0)
MCV: 89.7 fL (ref 80.0–100.0)
Platelets: 281 10*3/uL (ref 150–400)
RBC: 4.48 MIL/uL (ref 4.22–5.81)
RDW: 14.8 % (ref 11.5–15.5)
WBC: 7.3 10*3/uL (ref 4.0–10.5)
nRBC: 0 % (ref 0.0–0.2)

## 2021-07-12 NOTE — Progress Notes (Signed)
[facility] Progress Note  Brigham Michael Carne Jr. 31 y.o. 02/23/1990  CC:  Jaundice, nausea/vomiting, elevated LFTs   Subjective: Patient resting comfortably in bed this morning.  Denies concerns at this time.  Tolerating clear liquid diet without nausea, vomiting, abdominal pain.  Denies fever or chills.  No bowel movement overnight.  ROS : Review of Systems  Constitutional:  Negative for chills and fever.  Gastrointestinal:  Negative for abdominal pain, blood in stool, constipation, diarrhea, heartburn, melena, nausea and vomiting.  Genitourinary:  Negative for dysuria and urgency.  Musculoskeletal:  Negative for falls.      Objective: Vital signs in last 24 hours: Vitals:   07/12/21 0023 07/12/21 0355  BP: (!) 151/95 137/79  Pulse: 66 (!) 59  Resp: 18 16  Temp: 97.9 F (36.6 C)   SpO2: 100% 99%    Physical Exam:  General:  Alert, cooperative, no distress, appears stated age  Head:  Normocephalic, without obvious abnormality, atraumatic  Eyes:  Scleral icterus, EOM's intact  Lungs:   Clear to auscultation bilaterally, respirations unlabored  Heart:  Regular rate and rhythm, S1, S2 normal  Abdomen:   Soft, non-tender, bowel sounds active all four quadrants,  no masses,   Extremities: Extremities normal, atraumatic, no  edema  Pulses: 2+ and symmetric    Lab Results: Recent Labs    07/11/21 0930 07/12/21 0543  NA 140 139  K 3.7 4.0  CL 105 103  CO2 24 27  GLUCOSE 124* 92  BUN 11 10  CREATININE 0.86 0.77  CALCIUM 10.0 9.5   Recent Labs    07/11/21 0930 07/12/21 0543  AST 314* 296*  ALT 931* 835*  ALKPHOS 393* 345*  BILITOT 8.4* 7.1*  PROT 8.4* 7.4  ALBUMIN 4.2 3.8   Recent Labs    07/11/21 0930 07/12/21 0543  WBC 10.5 7.3  NEUTROABS 8.9*  --   HGB 14.6 13.0  HCT 45.6 40.2  MCV 92.3 89.7  PLT 297 281   No results for input(s): "LABPROT", "INR" in the last 72 hours.    Assessment Elevated LFTs, pancreatic mass - AST 296,  ALT 35, alk phos 345, T. bili 7.1 (all improved since yesterday) - Normal kidney function - No leukocytosis or anemia - MRCP 07/02/2021 showing 3.1 x 2.3 cm hypoenhancing mass along the posteromedial aspect of the pancreatic head, suspicious for pancreatic neoplasm. EUS is suggested. Multiple small liver lesions, suspicious for hepatic metastases. Upper abdominal/retroperitoneal lymphadenopathy, suspicious for nodal metastases.  Plan: Plan for EUS/ERCP tomorrow. I thoroughly discussed the procedures to include nature, alternatives, benefits, and risks including but not limited to bleeding, perforation, infection, anesthesia/cardiac and pulmonary complications. Patient provides understanding and gave verbal consent to proceed. Continue IV Protonix. Continue clear liquid diet. NPO at midnight. Continue anti-emetics and supportive care as needed. Continue to trend LFTs. [facility] will follow.      E  PA-C 07/12/2021, 9:52 AM  Contact #  [phone]  

## 2021-07-12 NOTE — Progress Notes (Signed)
PROGRESS NOTE Leon Fischer.  IBB:[id number] DOB: Oct 14, 1990 DOA: 07/11/2021 PCP: Patient, No Pcp Per (Inactive)   Brief Narrative/Hospital Course: 31 y.o. male with medical history significant of asthma, morbid obesity, pancreatic mass/liver lesions. Presenting with N/V. He presented to [facility] service this morning for a scheduled EUS/ERCP for recently discovered pancreatic mass/liver lesions. Prior to the procedure, he had an episode of significant non-bloody emesis. They shut down the procedure. Getting CT ab/pelvis to r/o obstruction. [facility] to attempt procedure again in AM. 6/8-AST/ALT 527/1334 on 5/30> at 296/835, total bili 7.1    Subjective: Seen and examined Tolerating clear liquid diet no new complaints   Assessment and Plan: Principal Problem:   Nausea & vomiting Active Problems:   Asthma   Pancreatic mass   Liver lesion   Obesity, Class III, BMI 40-49.9 (morbid obesity) (HCC)  Nausea & vomiting Pancreatic mass Liver lesion/abnormal LFTs: Overnight afebrile no leukocytosis, normal renal function.  MRCP reviewed reviewed from 5/29 showing pancreatic head mass.  CT abdomen 6/7 gas/fluid throughout the colon suggestive of diarrheal illness, no interval change in hypodense/hypoenhancing masslike area in the pancreatic head with ductal dilatation, enlarged upper abdominal lymph nodes and subtle hypodense hepatic lesions. GI planning for EUS/ERCP tomorrow.  Continue clear liquid diet, IV Protonix antiemetics, n.p.o. past midnight.  Repeat LFTs in a.m.  Asthma: Not in exacerbation   Class III Obesity:Patient's Body mass index is 44.08 kg/m. : Will benefit with PCP follow-up, weight loss  healthy lifestyle and outpatient sleep evaluation.   DVT prophylaxis: SCDs Start: 07/11/21 4503 Code Status:   Code Status: Full Code Family Communication: plan of care discussed with patient at bedside. Patient status is: Observation but will need at least 2 midnight study for  ongoing management of pancreatic mass with further work-up including EUS/ERCP  Level of care: Telemetry   Dispo: The patient is from: HOME            Anticipated disposition: HOME in 1-2 DAYS Mobility Assessment (last 72 hours)     Mobility Assessment     Row Name 07/11/21 2152 07/11/21 1335         Does patient have an order for bedrest or is patient medically unstable No - Continue assessment No - Continue assessment      What is the highest level of mobility based on the progressive mobility assessment? Level 6 (Walks independently in room and hall) - Balance while walking in room without assist - Complete Level 6 (Walks independently in room and hall) - Balance while walking in room without assist - Complete                Objective: Vitals last 24 hrs: Vitals:   07/11/21 1311 07/11/21 2059 07/12/21 0023 07/12/21 0355  BP: (!) 142/97 124/79 (!) 151/95 137/79  Pulse: 95 74 66 (!) 59  Resp: '16 18 18 16  '$ Temp: 98.3 F (36.8 C) 97.9 F (36.6 C) 97.9 F (36.6 C)   TempSrc:   Oral   SpO2: 100% 100% 100% 99%  Weight:      Height:       Weight change:   Physical Examination: General exam: alert awake,older than stated age, weak appearing. HEENT:Oral mucosa moist, Ear/Nose WNL grossly, dentition normal. Respiratory system: bilaterally diminished BS, no use of accessory muscle Cardiovascular system: S1 & S2 +, No JVD. Gastrointestinal system: Abdomen soft,NT,ND, BS+ Nervous System:Alert, awake, moving extremities and grossly nonfocal Extremities: LE edema NEG,distal peripheral pulses palpable.  Skin:  No rashes,no icterus. MSK: Normal muscle bulk,tone, power  Medications reviewed:  Scheduled Meds:  pantoprazole (PROTONIX) IV  40 mg Intravenous Daily   Continuous Infusions:  sodium chloride 75 mL/hr at 07/12/21 0300      Diet Order             Diet NPO time specified  Diet effective midnight           Diet clear liquid Room service appropriate? Yes; Fluid  consistency: Thin  Diet effective now                            Intake/Output Summary (Last 24 hours) at 07/12/2021 1359 Last data filed at 07/12/2021 0900 Gross per 24 hour  Intake 942.24 ml  Output --  Net 942.24 ml   Net IO Since Admission: 945.57 mL [07/12/21 1359]  Wt Readings from Last 3 Encounters:  07/11/21 (!) 147.4 kg  07/02/21 (!) 147.7 kg  01/14/19 (!) 149.7 kg     Unresulted Labs (From admission, onward)    None     Data Reviewed: I have personally reviewed following labs and imaging studies CBC: Recent Labs  Lab 07/11/21 0930 07/12/21 0543  WBC 10.5 7.3  NEUTROABS 8.9*  --   HGB 14.6 13.0  HCT 45.6 40.2  MCV 92.3 89.7  PLT 297 175   Basic Metabolic Panel: Recent Labs  Lab 07/11/21 0930 07/12/21 0543  NA 140 139  K 3.7 4.0  CL 105 103  CO2 24 27  GLUCOSE 124* 92  BUN 11 10  CREATININE 0.86 0.77  CALCIUM 10.0 9.5   GFR: Estimated Creatinine Clearance: 201.5 mL/min (by C-G formula based on SCr of 0.77 mg/dL). Liver Function Tests: Recent Labs  Lab 07/11/21 0930 07/12/21 0543  AST 314* 296*  ALT 931* 835*  ALKPHOS 393* 345*  BILITOT 8.4* 7.1*  PROT 8.4* 7.4  ALBUMIN 4.2 3.8   No results for input(s): "LIPASE", "AMYLASE" in the last 168 hours. No results for input(s): "AMMONIA" in the last 168 hours. Coagulation Profile: No results for input(s): "INR", "PROTIME" in the last 168 hours. BNP (last 3 results) No results for input(s): "PROBNP" in the last 8760 hours. HbA1C: No results for input(s): "HGBA1C" in the last 72 hours. CBG: No results for input(s): "GLUCAP" in the last 168 hours. Lipid Profile: No results for input(s): "CHOL", "HDL", "LDLCALC", "TRIG", "CHOLHDL", "LDLDIRECT" in the last 72 hours. Thyroid Function Tests: No results for input(s): "TSH", "T4TOTAL", "FREET4", "T3FREE", "THYROIDAB" in the last 72 hours. Sepsis Labs: No results for input(s): "PROCALCITON", "LATICACIDVEN" in the last 168 hours.  No results  found for this or any previous visit (from the past 240 hour(s)).  Antimicrobials: Anti-infectives (From admission, onward)    None      Culture/Microbiology No results found for: "SDES", "SPECREQUEST", "CULT", "REPTSTATUS"  Other culture-see note  Radiology Studies: CT ABDOMEN PELVIS W CONTRAST  Result Date: 07/11/2021 CLINICAL DATA:  Bowel obstruction suspected. EXAM: CT ABDOMEN AND PELVIS WITH CONTRAST TECHNIQUE: Multidetector CT imaging of the abdomen and pelvis was performed using the standard protocol following bolus administration of intravenous contrast. RADIATION DOSE REDUCTION: This exam was performed according to the departmental dose-optimization program which includes automated exposure control, adjustment of the mA and/or kV according to patient size and/or use of iterative reconstruction technique. CONTRAST:  137m OMNIPAQUE IOHEXOL 300 MG/ML  SOLN COMPARISON:  CT and MRI Jul 02, 2021 FINDINGS: Lower chest: No acute  abnormality. Hepatobiliary: No significant interval change in the subtle hypodense hepatic lesions measuring up to 1.3 cm in the right lobe of the liver on image 21/2. Gallbladder is unremarkable. Similar mild prominence of the biliary tree without dilation. Pancreas: No significant interval change in the ill-defined hypoenhancing masslike area along the posterior pancreatic head which measures approximally 2.7 cm on image 32/2. Similar mild pancreatic ductal dilation without significant pancreatic atrophy. Spleen: No splenomegaly or focal splenic lesion. Adrenals/Urinary Tract: Bilateral adrenal glands appear normal. No hydronephrosis. Kidneys demonstrate symmetric enhancement. No suspicious renal mass. Stomach/Bowel: Stomach is unremarkable for degree of distension. No pathologic dilation of small or large bowel. Terminal ileum appears normal. Normal appendix. Gas fluid levels throughout the colon suggestive of diarrheal illness. Vascular/Lymphatic: Normal caliber abdominal  aorta. Enlarged retroperitoneal/upper abdominal lymph nodes are again seen an unchanged for instance an aortocaval lymph node measuring 15 mm in short axis on image 34/2. Reproductive: Prostate is unremarkable. Other: No significant abdominopelvic free fluid. Musculoskeletal: No aggressive lytic or blastic lesion of bone. IMPRESSION: 1. Gas fluid levels throughout the colon suggestive of diarrheal illness. No evidence of bowel obstruction. 2. No significant interval change in the hypodense/hypoenhancing masslike area in the pancreatic head with associated ductal dilation. 3. Stable enlarged upper abdominal lymph nodes and subtle hypodense hepatic lesions. Electronically Signed   By: Dahlia Bailiff M.D.   On: 07/11/2021 13:03     LOS: 0 days   Antonieta Pert, MD [facility]  07/12/2021, 1:59 PM

## 2021-07-12 NOTE — Hospital Course (Signed)
31 y.o. male with medical history significant of asthma, morbid obesity, pancreatic mass/liver lesions. Presenting with N/V. He presented to [facility] service this morning for a scheduled EUS/ERCP for recently discovered pancreatic mass/liver lesions. Prior to the procedure, he had an episode of significant non-bloody emesis. They shut down the procedure. Getting CT ab/pelvis to r/o obstruction. [facility] to attempt procedure again in AM. 6/8-AST/ALT 527/1334 on 5/30> at 296/835, total bili 7.1

## 2021-07-12 NOTE — Progress Notes (Signed)
  Transition of Care [facility]) Screening Note   Patient Details  Name: Leon Fischer. Date of Birth: 1990-08-16   [facility] of [facility]) CM/SW Contact:    Vassie Moselle, LCSW Phone Number: 07/12/2021, 10:26 AM    Transition of Care Department [facility]) has reviewed patient and no TOC needs have been identified at this time. We will continue to monitor patient advancement through interdisciplinary progression rounds. If new patient transition needs arise, please place a TOC consult.

## 2021-07-12 NOTE — Plan of Care (Signed)

## 2021-07-12 NOTE — Anesthesia Preprocedure Evaluation (Addendum)
Anesthesia Evaluation  Patient identified by MRN, date of birth, ID band Patient awake    Reviewed: Allergy & Precautions, NPO status , Patient's Chart, lab work & pertinent test results  Airway Mallampati: II  TM Distance: >3 FB Neck ROM: Full    Dental no notable dental hx. (+) Teeth Intact, Dental Advisory Given   Pulmonary asthma , Patient abstained from smoking., former smoker,    Pulmonary exam normal breath sounds clear to auscultation       Cardiovascular negative cardio ROS Normal cardiovascular exam Rhythm:Regular Rate:Normal     Neuro/Psych negative neurological ROS  negative psych ROS   GI/Hepatic negative GI ROS, Neg liver ROS, Pancreatic mass   Endo/Other  Morbid obesity  Renal/GU negative Renal ROS  negative genitourinary   Musculoskeletal negative musculoskeletal ROS (+)   Abdominal   Peds negative pediatric ROS (+)  Hematology negative hematology ROS (+)   Anesthesia Other Findings   Reproductive/Obstetrics negative OB ROS                            Anesthesia Physical  Anesthesia Plan  ASA: 3  Anesthesia Plan: General   Post-op Pain Management: Minimal or no pain anticipated   Induction: Intravenous  PONV Risk Score and Plan: 2 and Ondansetron and Dexamethasone  Airway Management Planned: Oral ETT  Additional Equipment: None  Intra-op Plan:   Post-operative Plan: Extubation in OR  Informed Consent: I have reviewed the patients History and Physical, chart, labs and discussed the procedure including the risks, benefits and alternatives for the proposed anesthesia with the patient or authorized representative who has indicated his/her understanding and acceptance.       Plan Discussed with: Anesthesiologist and CRNA  Anesthesia Plan Comments: (  )        Anesthesia Quick Evaluation

## 2021-07-13 ENCOUNTER — Encounter (HOSPITAL_COMMUNITY): Payer: Self-pay | Admitting: Internal Medicine

## 2021-07-13 ENCOUNTER — Observation Stay (HOSPITAL_COMMUNITY): Payer: 59 | Admitting: Anesthesiology

## 2021-07-13 ENCOUNTER — Encounter (HOSPITAL_COMMUNITY)
Admission: AD | Disposition: A | Discharge status: Home / Self Care | Payer: Self-pay | Source: Home / Self Care | Attending: Internal Medicine

## 2021-07-13 ENCOUNTER — Inpatient Hospital Stay (HOSPITAL_COMMUNITY): Payer: 59

## 2021-07-13 DIAGNOSIS — K838 Other specified diseases of biliary tract: Secondary | ICD-10-CM | POA: Diagnosis not present

## 2021-07-13 DIAGNOSIS — F17200 Nicotine dependence, unspecified, uncomplicated: Secondary | ICD-10-CM | POA: Diagnosis present

## 2021-07-13 DIAGNOSIS — K2990 Gastroduodenitis, unspecified, without bleeding: Secondary | ICD-10-CM

## 2021-07-13 DIAGNOSIS — Z6841 Body Mass Index (BMI) 40.0 and over, adult: Secondary | ICD-10-CM | POA: Diagnosis not present

## 2021-07-13 DIAGNOSIS — K769 Liver disease, unspecified: Secondary | ICD-10-CM | POA: Diagnosis present

## 2021-07-13 DIAGNOSIS — R7989 Other specified abnormal findings of blood chemistry: Secondary | ICD-10-CM | POA: Diagnosis present

## 2021-07-13 DIAGNOSIS — K8689 Other specified diseases of pancreas: Secondary | ICD-10-CM

## 2021-07-13 DIAGNOSIS — K3189 Other diseases of stomach and duodenum: Secondary | ICD-10-CM

## 2021-07-13 DIAGNOSIS — Z79899 Other long term (current) drug therapy: Secondary | ICD-10-CM | POA: Diagnosis not present

## 2021-07-13 DIAGNOSIS — M549 Dorsalgia, unspecified: Secondary | ICD-10-CM | POA: Diagnosis not present

## 2021-07-13 DIAGNOSIS — C250 Malignant neoplasm of head of pancreas: Secondary | ICD-10-CM | POA: Diagnosis present

## 2021-07-13 DIAGNOSIS — R112 Nausea with vomiting, unspecified: Secondary | ICD-10-CM | POA: Diagnosis present

## 2021-07-13 DIAGNOSIS — Z91013 Allergy to seafood: Secondary | ICD-10-CM | POA: Diagnosis not present

## 2021-07-13 DIAGNOSIS — Z813 Family history of other psychoactive substance abuse and dependence: Secondary | ICD-10-CM | POA: Diagnosis not present

## 2021-07-13 DIAGNOSIS — K2970 Gastritis, unspecified, without bleeding: Secondary | ICD-10-CM | POA: Diagnosis present

## 2021-07-13 DIAGNOSIS — R590 Localized enlarged lymph nodes: Secondary | ICD-10-CM | POA: Diagnosis present

## 2021-07-13 DIAGNOSIS — J45909 Unspecified asthma, uncomplicated: Secondary | ICD-10-CM | POA: Diagnosis present

## 2021-07-13 DIAGNOSIS — K831 Obstruction of bile duct: Secondary | ICD-10-CM | POA: Diagnosis present

## 2021-07-13 DIAGNOSIS — K219 Gastro-esophageal reflux disease without esophagitis: Secondary | ICD-10-CM | POA: Diagnosis present

## 2021-07-13 HISTORY — PX: ENDOSCOPIC RETROGRADE CHOLANGIOPANCREATOGRAPHY (ERCP) WITH PROPOFOL: SHX5810

## 2021-07-13 HISTORY — PX: BILIARY STENT PLACEMENT: SHX5538

## 2021-07-13 HISTORY — PX: FINE NEEDLE ASPIRATION: SHX5430

## 2021-07-13 HISTORY — PX: ESOPHAGOGASTRODUODENOSCOPY (EGD) WITH PROPOFOL: SHX5813

## 2021-07-13 HISTORY — PX: BILIARY DILATION: SHX6850

## 2021-07-13 HISTORY — PX: EUS: SHX5427

## 2021-07-13 HISTORY — PX: BIOPSY: SHX5522

## 2021-07-13 HISTORY — PX: SPHINCTEROTOMY: SHX5544

## 2021-07-13 LAB — COMPREHENSIVE METABOLIC PANEL
ALT: 744 U/L — ABNORMAL HIGH (ref 0–44)
ALT: 613 U/L — ABNORMAL HIGH (ref 0–44)
AST: 251 U/L — ABNORMAL HIGH (ref 15–41)
AST: 293 U/L — ABNORMAL HIGH (ref 15–41)
Albumin: 3.6 g/dL (ref 3.5–5.0)
Albumin: 3.9 g/dL (ref 3.5–5.0)
Alkaline Phosphatase: 327 U/L — ABNORMAL HIGH (ref 38–126)
Alkaline Phosphatase: 366 U/L — ABNORMAL HIGH (ref 38–126)
Anion gap: 8 (ref 5–15)
Anion gap: 11 (ref 5–15)
BUN: 9 mg/dL (ref 6–20)
BUN: 10 mg/dL (ref 6–20)
CO2: 26 mmol/L (ref 22–32)
CO2: 23 mmol/L (ref 22–32)
Calcium: 9.3 mg/dL (ref 8.9–10.3)
Calcium: 10 mg/dL (ref 8.9–10.3)
Chloride: 108 mmol/L (ref 98–111)
Chloride: 105 mmol/L (ref 98–111)
Creatinine, Ser: 0.74 mg/dL (ref 0.61–1.24)
Creatinine, Ser: 0.89 mg/dL (ref 0.61–1.24)
GFR, Estimated: >60 mL/min (ref >60)
GFR, Estimated: >60 mL/min (ref >60)
Glucose, Bld: 91 mg/dL (ref 70–99)
Glucose, Bld: 146 mg/dL — ABNORMAL HIGH (ref 70–99)
Potassium: 4 mmol/L (ref 3.5–5.1)
Potassium: 4.1 mmol/L (ref 3.5–5.1)
Sodium: 142 mmol/L (ref 135–145)
Sodium: 139 mmol/L (ref 135–145)
Total Bilirubin: 7.4 mg/dL — ABNORMAL HIGH (ref 0.3–1.2)
Total Bilirubin: 8 mg/dL — ABNORMAL HIGH (ref 0.3–1.2)
Total Protein: 7 g/dL (ref 6.5–8.1)
Total Protein: 7.9 g/dL (ref 6.5–8.1)

## 2021-07-13 IMAGING — RF DG ERCP WO/W SPHINCTEROTOMY
1 series · 6 of 6 positions shown · non-contrast
Comparison: CT of the abdomen on [DATE]

CLINICAL DATA: Pancreatic mass, jaundice and abnormal liver
function tests.

EXAM:
ERCP
TECHNIQUE: Multiple spot images obtained with the fluoroscopic device and
submitted for interpretation post-procedure.

[Series 1: run · 6 of 6 slices shown]
[im 1/6]
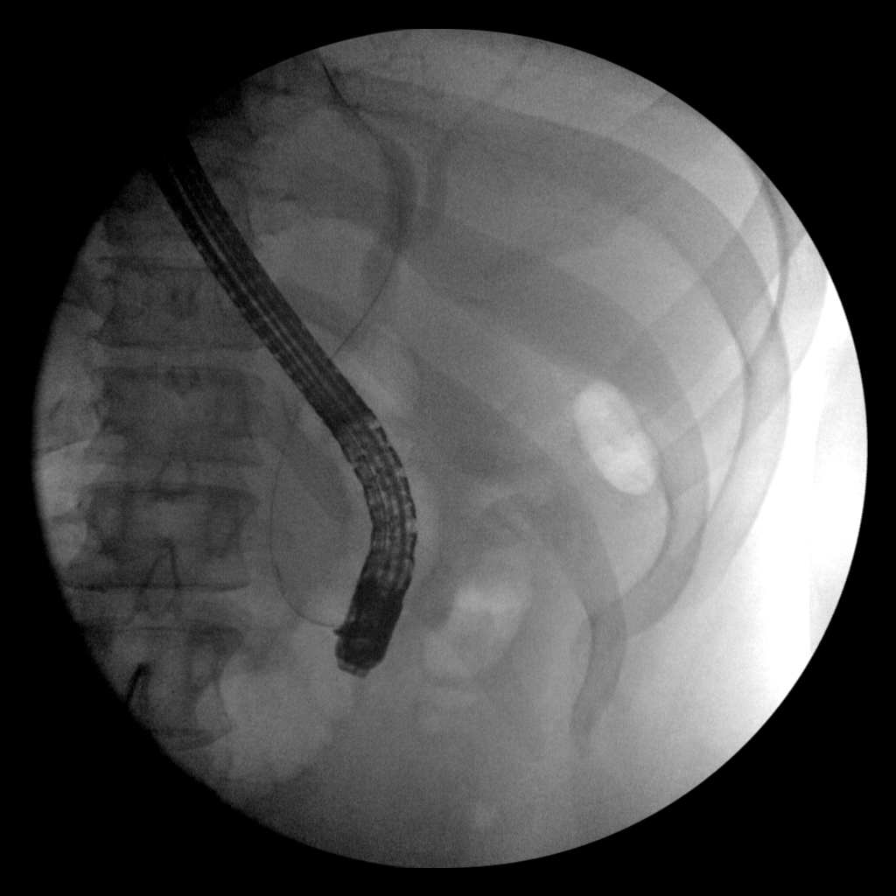
[im 2/6]
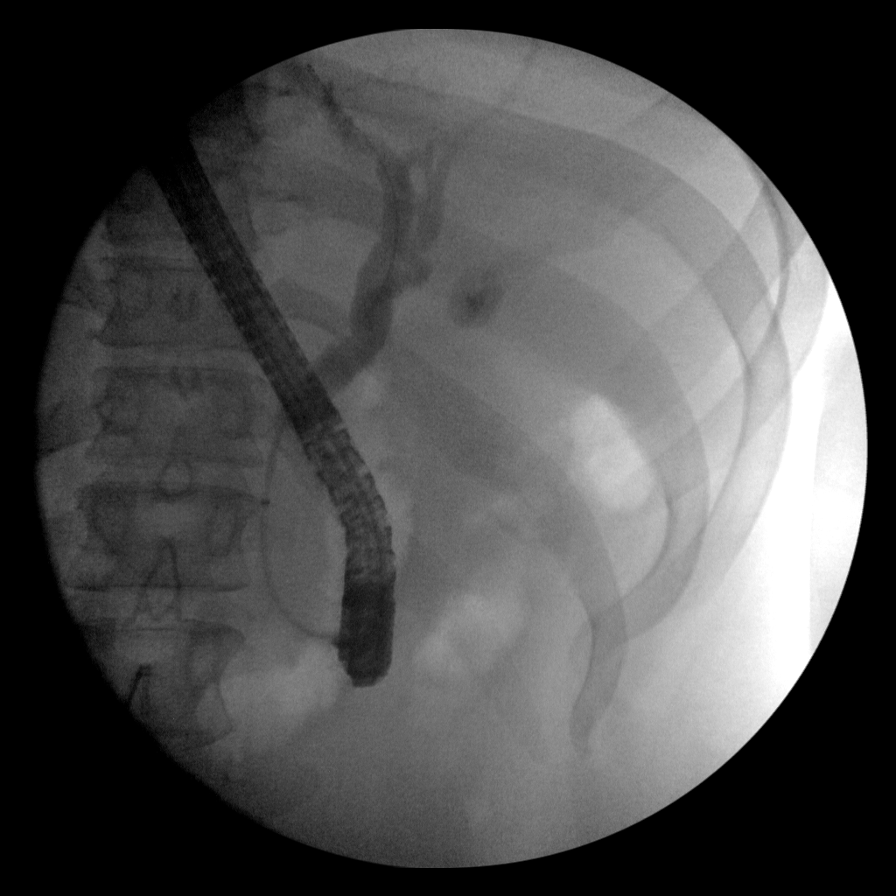
[im 3/6]
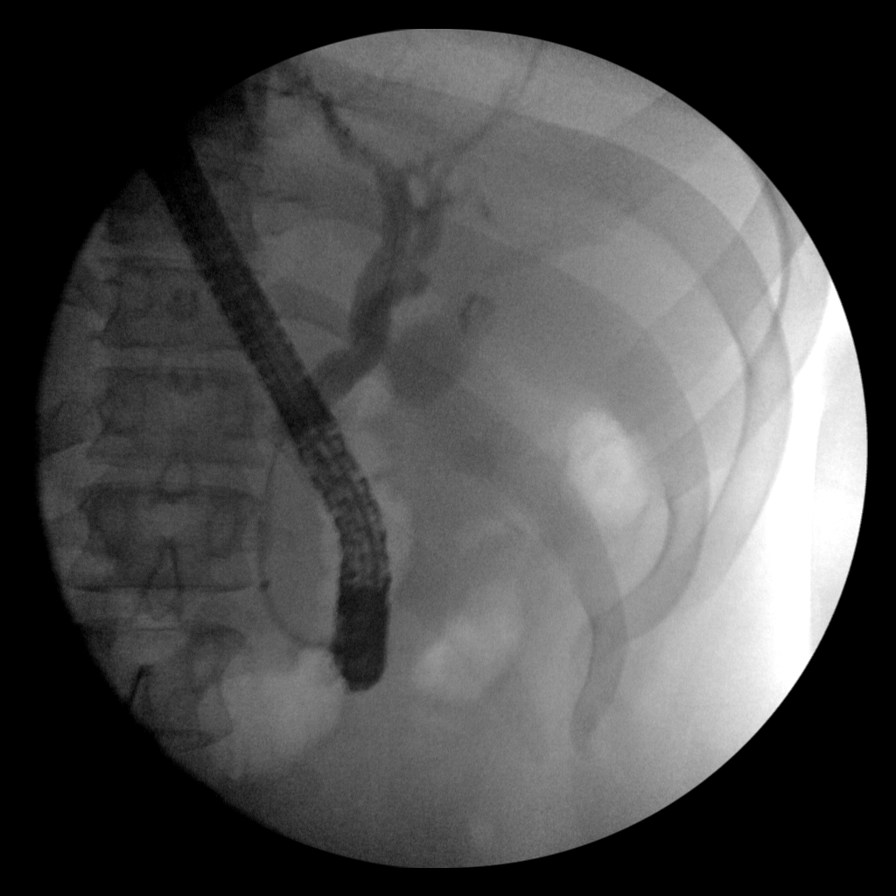
[im 4/6]
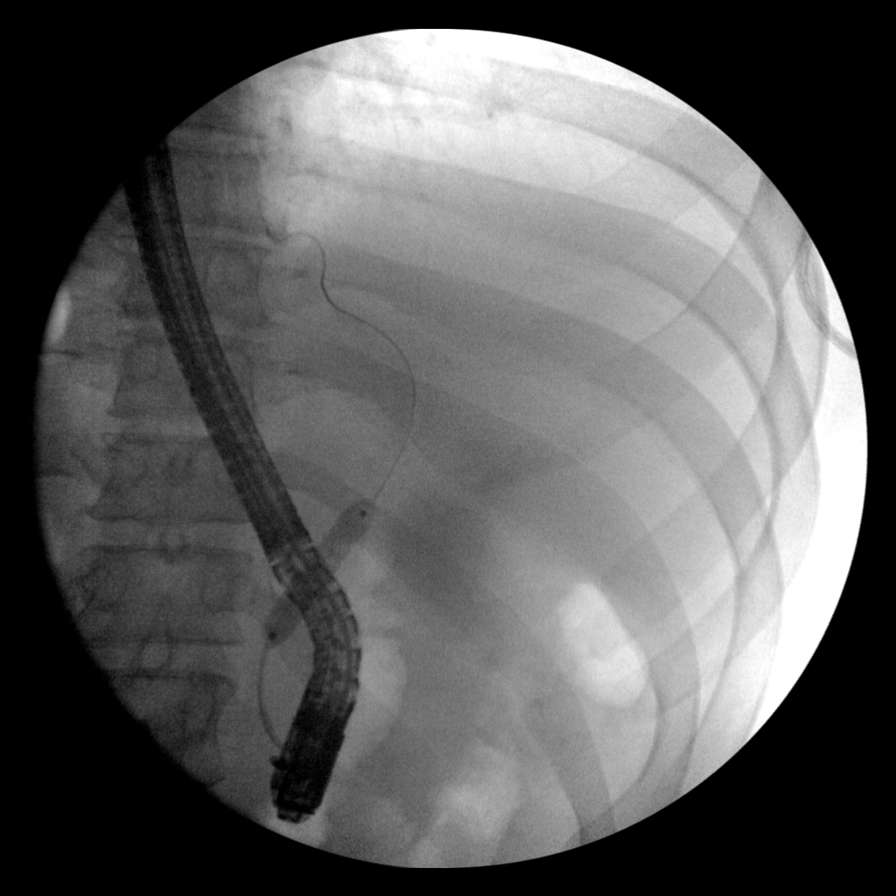
[im 5/6]
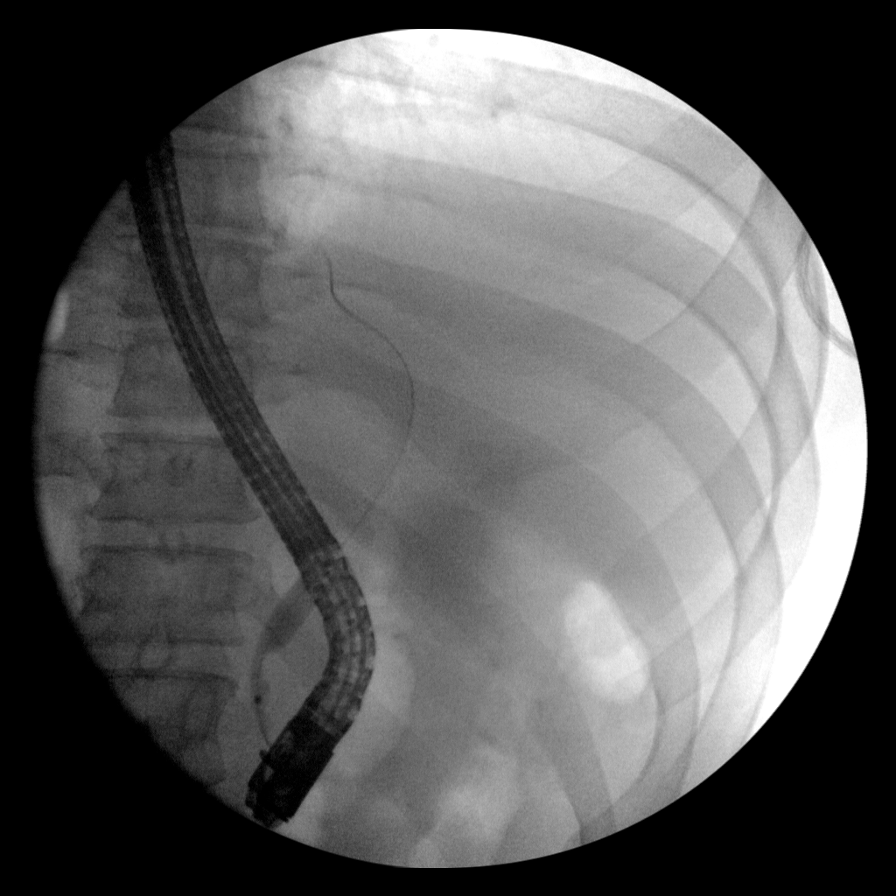
[im 6/6]
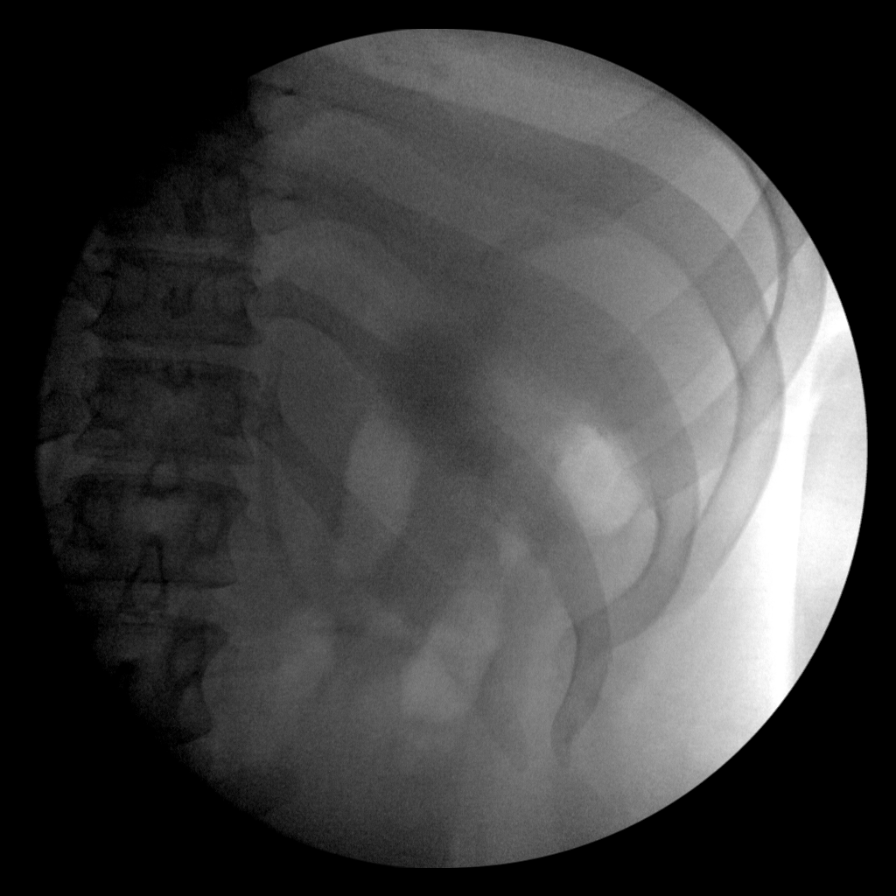

[6 of 6 positions shown; findings below may reference images not displayed]

FINDINGS: Imaging during ERCP demonstrates evidence probable stricture of the
distal common bile duct at the level of a known mass involving the
pancreatic head. Balloon dilatation was performed followed by
placement of an endoscopic biliary stent in the common bile duct.
Contrast injection demonstrates mild prominence in caliber of the
common bile duct above the level of stricture.
IMPRESSION: Distal common bile duct stricture treated by balloon dilatation and
placement of an endoscopic biliary stent.

These images were submitted for radiologic interpretation only.
Please see the procedural report for the amount of contrast and the
fluoroscopy time utilized.

## 2021-07-13 SURGERY — ESOPHAGOGASTRODUODENOSCOPY (EGD) WITH PROPOFOL
Anesthesia: General

## 2021-07-13 MED ORDER — OXYCODONE HCL 5 MG/5ML PO SOLN: 5.0000 mg | Freq: Once | ORAL | Status: DC | PRN

## 2021-07-13 MED ORDER — DEXAMETHASONE SODIUM PHOSPHATE 10 MG/ML IJ SOLN
INTRAMUSCULAR | Status: DC | PRN
  Administered 2021-07-13: 10 mg via INTRAVENOUS

## 2021-07-13 MED ORDER — OXYCODONE HCL 5 MG PO TABS: 5.0000 mg | ORAL_TABLET | Freq: Once | ORAL | Status: DC | PRN

## 2021-07-13 MED ORDER — ACETAMINOPHEN 325 MG PO TABS: 325.0000 mg | ORAL_TABLET | ORAL | Status: DC | PRN

## 2021-07-13 MED ORDER — SUGAMMADEX SODIUM 200 MG/2ML IV SOLN
INTRAVENOUS | Status: DC | PRN
  Administered 2021-07-13: 200 mg via INTRAVENOUS

## 2021-07-13 MED ORDER — SODIUM CHLORIDE 0.9 % IV SOLN
INTRAVENOUS | Status: DC | PRN
  Administered 2021-07-13: 30 mL

## 2021-07-13 MED ORDER — PROPOFOL 10 MG/ML IV BOLUS
INTRAVENOUS | Status: AC
  Filled 2021-07-13: qty 20

## 2021-07-13 MED ORDER — LIDOCAINE HCL (CARDIAC) PF 100 MG/5ML IV SOSY
PREFILLED_SYRINGE | INTRAVENOUS | Status: DC | PRN
  Administered 2021-07-13: 100 mg via INTRAVENOUS

## 2021-07-13 MED ORDER — SUCCINYLCHOLINE CHLORIDE 200 MG/10ML IV SOSY
PREFILLED_SYRINGE | INTRAVENOUS | Status: DC | PRN
  Administered 2021-07-13: 200 mg via INTRAVENOUS

## 2021-07-13 MED ORDER — INDOMETHACIN 50 MG RE SUPP
RECTAL | Status: AC
  Filled 2021-07-13: qty 2

## 2021-07-13 MED ORDER — MIDAZOLAM HCL 5 MG/5ML IJ SOLN
INTRAMUSCULAR | Status: DC | PRN
  Administered 2021-07-13: 2 mg via INTRAVENOUS

## 2021-07-13 MED ORDER — FENTANYL CITRATE (PF) 100 MCG/2ML IJ SOLN
INTRAMUSCULAR | Status: AC
  Filled 2021-07-13: qty 2

## 2021-07-13 MED ORDER — CIPROFLOXACIN IN D5W 400 MG/200ML IV SOLN
INTRAVENOUS | Status: DC | PRN
  Administered 2021-07-13: 400 mg via INTRAVENOUS

## 2021-07-13 MED ORDER — FENTANYL CITRATE (PF) 100 MCG/2ML IJ SOLN
25.0000 ug | INTRAMUSCULAR | Status: DC | PRN
  Administered 2021-07-13 (×4): 50 ug via INTRAVENOUS

## 2021-07-13 MED ORDER — MIDAZOLAM HCL 2 MG/2ML IJ SOLN
INTRAMUSCULAR | Status: AC
  Filled 2021-07-13: qty 2

## 2021-07-13 MED ORDER — GLYCOPYRROLATE 0.2 MG/ML IJ SOLN
INTRAMUSCULAR | Status: DC | PRN
  Administered 2021-07-13: .2 mg via INTRAVENOUS

## 2021-07-13 MED ORDER — PROPOFOL 10 MG/ML IV BOLUS
INTRAVENOUS | Status: DC | PRN
  Administered 2021-07-13 (×2): 50 mg via INTRAVENOUS
  Administered 2021-07-13: 200 mg via INTRAVENOUS

## 2021-07-13 MED ORDER — MEPERIDINE HCL 50 MG/ML IJ SOLN: 6.2500 mg | INTRAMUSCULAR | Status: DC | PRN

## 2021-07-13 MED ORDER — LACTATED RINGERS IV SOLN
INTRAVENOUS | Status: AC | PRN
  Administered 2021-07-13: 1000 mL via INTRAVENOUS

## 2021-07-13 MED ORDER — INDOMETHACIN 50 MG RE SUPP
RECTAL | Status: DC | PRN
  Administered 2021-07-13: 100 mg via RECTAL

## 2021-07-13 MED ORDER — ONDANSETRON HCL 4 MG/2ML IJ SOLN: 4.0000 mg | Freq: Once | INTRAMUSCULAR | Status: DC | PRN

## 2021-07-13 MED ORDER — OXYCODONE HCL 5 MG PO TABS
5.0000 mg | ORAL_TABLET | Freq: Four times a day (QID) | ORAL | Status: DC | PRN
  Administered 2021-07-13 – 2021-07-15 (×3): 5 mg via ORAL
  Filled 2021-07-13 (×3): qty 1

## 2021-07-13 MED ORDER — CIPROFLOXACIN IN D5W 400 MG/200ML IV SOLN
INTRAVENOUS | Status: AC
  Filled 2021-07-13: qty 200

## 2021-07-13 MED ORDER — DICLOFENAC SUPPOSITORY 100 MG
RECTAL | Status: AC
  Filled 2021-07-13: qty 1

## 2021-07-13 MED ORDER — ACETAMINOPHEN 160 MG/5ML PO SOLN: 325.0000 mg | ORAL | Status: DC | PRN

## 2021-07-13 MED ORDER — ROCURONIUM BROMIDE 100 MG/10ML IV SOLN
INTRAVENOUS | Status: DC | PRN
  Administered 2021-07-13: 5 mg via INTRAVENOUS
  Administered 2021-07-13: 45 mg via INTRAVENOUS
  Administered 2021-07-13 (×2): 20 mg via INTRAVENOUS

## 2021-07-13 MED ORDER — GLUCAGON HCL RDNA (DIAGNOSTIC) 1 MG IJ SOLR
INTRAMUSCULAR | Status: AC
  Filled 2021-07-13: qty 1

## 2021-07-13 SURGICAL SUPPLY — 15 items

## 2021-07-13 NOTE — Progress Notes (Signed)
[facility] Progress Note  Leon Fischer. 31 y.o. 05/21/1990  CC:  Jaundice, nausea/vomiting, elevated LFTs   Subjective: Patient sitting up in bed, family at bedside. Denies abdominal pain but has been having some back pain since procedure this morning. Denies nausea, vomiting, fever, chills. No bowel movement since last night which was normal. Tolerating clears. Does not want to advance diet at this time.  ROS : Review of Systems  Constitutional:  Negative for chills and fever.  Gastrointestinal:  Negative for abdominal pain, blood in stool, constipation, diarrhea, heartburn, melena, nausea and vomiting.  Genitourinary:  Negative for dysuria and urgency.  Musculoskeletal:  Positive for back pain. Negative for falls.      Objective: Vital signs in last 24 hours: Vitals:   07/13/21 1120 07/13/21 1141  BP: (!) 153/92 120/78  Pulse: 79 89  Resp: (!) 0 16  Temp:  98.1 F (36.7 C)  SpO2: 96% 99%    Physical Exam:  General:  Alert, cooperative, no distress, appears stated age  Head:  Normocephalic, without obvious abnormality, atraumatic  Eyes:  Scleral icterus, EOM's intact  Lungs:   Clear to auscultation bilaterally, respirations unlabored  Heart:  Regular rate and rhythm, S1, S2 normal  Abdomen:   Soft, mild tenderness to palpation of epigastrium, bowel sounds active all four quadrants  Extremities: Extremities normal, atraumatic, no  edema  Pulses: 2+ and symmetric    Lab Results: Recent Labs    07/13/21 0526 07/13/21 1135  NA 142 139  K 4.0 4.1  CL 108 105  CO2 26 23  GLUCOSE 91 146*  BUN 9 10  CREATININE 0.74 0.89  CALCIUM 9.3 10.0   Recent Labs    07/13/21 0526 07/13/21 1135  AST 251* 293*  ALT 744* 613*  ALKPHOS 327* 366*  BILITOT 7.4* 8.0*  PROT 7.0 7.9  ALBUMIN 3.6 3.9   Recent Labs    07/11/21 0930 07/12/21 0543  WBC 10.5 7.3  NEUTROABS 8.9*  --   HGB 14.6 13.0  HCT 45.6 40.2  MCV 92.3 89.7  PLT 297 281   No  results for input(s): "LABPROT", "INR" in the last 72 hours.    Assessment Elevated LFTs, pancreatic mass - AST 293, ALT 613, alk phos 366, T. bili 8.0  - Normal kidney function - No leukocytosis or anemia - MRCP 07/02/2021 showing 3.1 x 2.3 cm hypoenhancing mass along the posteromedial aspect of the pancreatic head, suspicious for pancreatic neoplasm. EUS is suggested. Multiple small liver lesions, suspicious for hepatic metastases. Upper abdominal/retroperitoneal lymphadenopathy, suspicious for nodal metastases.  -EGD/EUS 07/13/2021  - A mass was identified in the pancreatic head. Cytology results are pending. However, the endosonographic appearance is suspicious for an undifferentiated carcinoma (query NET more than adenocarcinoma). This was staged T2 Nx Mx by endosonographic criteria. The staging applies if malignancy is confirmed from the pancreatic biopsies. Biopsies of the mass were obtained with notation of adequate cellularity but preliminary diagnosis not able to be given today. - There was dilation in the common bile duct and in the common hepatic duct which measured up to 11 mm. - Hyperechoic material consistent with sludge was visualized endosonographically in the gallbladder. - A few enlarged lymph nodes were visualized in the celiac region (level 20) and peripancreatic region.  -ERCP 07/13/2021  - The major papilla appeared normal. - A single localized biliary stricture was found in the lower third of the main bile duct. The stricture was indeterminate. This stricture was treated  with biliary sphincterotomy. - The biliary tree was swept and nothing was found. - Cells for cytology obtained in the lower third of the main duct. - One plastic biliary stent was placed into the common bile duct but could not be advanced as above so it was removed. - Common bile duct was successfully dilated. - One plastic biliary stent was placed into the common bile duct, and hopefully  was advanced far enough.  Plan: S/p EUS and ERCP. Discussed with patient this afternoon. Awaiting pathology results. Hopefully patient can be discharged this weekend with outpatient follow up. On clear liquid diet and patient not wanting to advance at this time. Okay to advance diet as tolerated tonight or tomorrow if he would like. Continue IV Protonix. Continue anti-emetics and supportive care as needed. Continue to trend LFTs. [facility] will follow.     Angelique Holm PA-C 07/13/2021, 3:52 PM  Contact #  [phone]

## 2021-07-13 NOTE — Anesthesia Postprocedure Evaluation (Signed)
Anesthesia Post Note  Patient: Leon Fischer.  Procedure(s) Performed: ESOPHAGOGASTRODUODENOSCOPY (EGD) WITH PROPOFOL UPPER ENDOSCOPIC ULTRASOUND (EUS) LINEAR BIOPSY FINE NEEDLE ASPIRATION (FNA) LINEAR ENDOSCOPIC RETROGRADE CHOLANGIOPANCREATOGRAPHY (ERCP) WITH PROPOFOL SPHINCTEROTOMY BILIARY DILATION BILIARY STENT PLACEMENT     Patient location during evaluation: PACU Anesthesia Type: General Level of consciousness: awake and alert Pain management: pain level controlled Vital Signs Assessment: post-procedure vital signs reviewed and stable Respiratory status: spontaneous breathing, nonlabored ventilation, respiratory function stable and patient connected to nasal cannula oxygen Cardiovascular status: blood pressure returned to baseline and stable Postop Assessment: no apparent nausea or vomiting Anesthetic complications: no   No notable events documented.  Last Vitals:  Vitals:   07/13/21 1120 07/13/21 1141  BP: (!) 153/92 120/78  Pulse: 79 89  Resp: (!) 0 16  Temp:  36.7 C  SpO2: 96% 99%    Last Pain:  Vitals:   07/13/21 1110  TempSrc:   PainSc: 0-No pain                 Marishka Rentfrow

## 2021-07-13 NOTE — Plan of Care (Signed)

## 2021-07-13 NOTE — Op Note (Addendum)
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 Estimated Blood Loss:     Estimated blood loss was minimal. Procedure:                Pre-Anesthesia Assessment:                           - Prior to the procedure, a History and Physical                            was performed, and patient medications and                            allergies were reviewed. The patient's tolerance of                            previous anesthesia was also reviewed. The risks                            and benefits of the procedure and the sedation                            options and risks were discussed with the patient.                            All questions were answered, and informed consent                            was obtained. Prior Anticoagulants: The patient has  taken no previous anticoagulant or antiplatelet                            agents. ASA Grade Assessment: II - A patient with                            mild systemic disease. After reviewing the risks                            and benefits, the patient was deemed in                            satisfactory condition to undergo  the procedure.                           After obtaining informed consent, the endoscope was                            passed under direct vision. Throughout the                            procedure, the patient's blood pressure, pulse, and                            oxygen saturations were monitored continuously. The                            GIF-H190 ([id number]) Olympus endoscope was introduced                            through the mouth, and advanced to the second part                            of duodenum. The TJF-Q190V ([id number]) Olympus                            duodenoscope was introduced through the mouth, and                            advanced to the area of papilla. The GF-UCT180                            ([id number]) Olympus linear ultrasound scope was                            introduced through the mouth, and advanced to the                            duodenum for ultrasound examination from the                            stomach and duodenum. The upper EUS was technically  difficult and complex due to significant looping.                            Successful completion of the procedure was aided by                            changing the patient's position to a more                            left-lateral position and using manual pressure.                            The patient tolerated the procedure. Scope In: Scope Out: Findings:      ENDOSCOPIC FINDING: :      No gross lesions were noted in the entire esophagus.      The Z-line was regular and was found 40 cm from the incisors.      Localized severely erythematous mucosa without bleeding was found in the       cardia and in the gastric body.      No other gross lesions were noted in the entire examined stomach.       Biopsies were taken with a cold forceps for histology and Helicobacter       pylori testing.      No gross lesions were noted in the duodenal bulb, in the first portion        of the duodenum and in the second portion of the duodenum.      The major papilla was normal.      ENDOSONOGRAPHIC FINDING: :      An irregular mass-like region was identified in the pancreatic head. The       area was hypoechoic and calcified. The mass measured 36 mm by 28 mm in       maximal cross-sectional diameter. The outer margins were irregular. An       intact interface was seen between the mass and the superior mesenteric       artery, celiac trunk and portal vein suggesting a lack of invasion. The       remainder of the pancreas was examined. The endosonographic appearance       of parenchyma and the upstream pancreatic duct indicated duct dilation       and parenchymal atrophy. There was evidence of lobularity throughout the       majority of the pancreas. There was also evidence of hyperechoic       stranding. Fine needle biopsy was performed. Color Doppler imaging was       utilized prior to needle puncture to confirm a lack of significant       vascular structures within the needle path. Seven passes were made with       the 22 gauge ultrasound core biopsy needle using a transduodenal       approach. A visible core of tissue was obtained. Preliminary cytologic       examination and touch preps were performed. The cellularity of the       specimen was adequate.      There was dilation in the common bile duct and in the common hepatic       duct which measured up to 11 mm.      Moderate  hyperechoic material consistent with sludge was visualized       endosonographically in the gallbladder.      Endosonographic imaging in the visualized portion of the liver showed no       mass.      A few enlarged lymph nodes were visualized in the celiac region (level       20) and peripancreatic region. The nodes were triangular and       heterogenous.      The celiac region was visualized. Impression:               EGD Impression:                           - No gross lesions in esophagus.  Z-line regular, 40                            cm from the incisors.                           - Erythematous mucosa in the cardia and gastric                            body. No other gross lesions in the stomach.                            Biopsied.                           - No gross lesions in the duodenal bulb, in the                            first portion of the duodenum and in the second                            portion of the duodenum.                           - Normal major papilla.                           EUS Impression:                           - A mass was identified in the pancreatic head.                            Cytology results are pending. 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 This was staged T2 Nx Mx by  endosonographic criteria. The staging applies if                            malignancy is confirmed from the pancreatic                            biopsies. Biopsies of the mass were obtained with                            notation of adequate cellularity but preliminary                            diagnosis not able to be given today.                           - There was dilation in the common bile duct and in                            the common hepatic duct which measured up to 11 mm.                           - Hyperechoic material consistent with sludge was                            visualized endosonographically in the gallbladder.                           - A few enlarged lymph nodes were visualized in the                            celiac region (level 20) and peripancreatic region. Moderate Sedation:      Not Applicable - Patient had care per Anesthesia. Recommendation:           - Proceed to scheduled ERCP with Dr. Watt Climes today.                           -  Observe patient's clinical course.                           - Await cytology results and await path results.                           - Obtain brushings if able to access biliary tree.                           - Consideration of repeat FNA/FNB pending                            pathology/cytology final results.                           - The findings and recommendations were discussed  with the referring physician. Procedure Code(s):        --- Professional ---                           [phone], Esophagogastroduodenoscopy, flexible,                            transoral; with endoscopic ultrasound examination                            limited to the esophagus, stomach or duodenum, and                            adjacent structures                           43239, Esophagogastroduodenoscopy, flexible,                            transoral; with biopsy, single or multiple Diagnosis Code(s):        --- Professional ---                           K31.89, Other diseases of stomach and duodenum                           K86.89, Other specified diseases of pancreas                           K83.8, Other specified diseases of biliary tract                           R59.0, Localized enlarged lymph nodes                           R93.3, Abnormal findings on diagnostic imaging of                            other parts of digestive tract                           R74.8, Abnormal levels of other serum enzymes                           R97.8, Other abnormal tumor markers CPT copyright 2019 [employer]. All rights reserved. The codes documented in this report are preliminary and upon coder review may  be revised to meet current compliance requirements. Justice Britain, MD 07/13/2021 2:27:07 PM Number of Addenda: 0

## 2021-07-13 NOTE — Op Note (Addendum)
[facility] Patient Name: Leon Fischer Procedure Date: 07/13/2021 MRN: [id number] Attending MD: Clarene Essex , MD Date of Birth: March 18, 1990 CSN: [id number] Age: 31 Admit Type: Inpatient Procedure:                ERCP Indications:              Abnormal abdominal CT, Jaundice, Tumor of the head                            of pancreas Providers:                Clarene Essex, MD, Dulcy Fanny, [facility]                            Technician, Technician Referring MD:              Medicines:                General Anesthesia Complications:            No immediate complications. Estimated Blood Loss:     Estimated blood loss: none. Procedure:                Pre-Anesthesia Assessment:                           - Prior to the procedure, a History and Physical                            was performed, and patient medications and                            allergies were reviewed. The patient's tolerance of                            previous anesthesia was also reviewed. The risks                            and benefits of the procedure and the sedation                            options and risks were discussed with the patient.                            All questions were answered, and informed consent                            was obtained. Prior Anticoagulants: The patient has                            taken no previous anticoagulant or antiplatelet                            agents. ASA Grade Assessment: II - A patient with                            mild systemic disease.  After reviewing the risks                            and benefits, the patient was deemed in                            satisfactory condition to undergo the procedure.                           After obtaining informed consent, the scope was                            passed under direct vision. Throughout the                            procedure, the patient's blood pressure, pulse, and                             oxygen saturations were monitored continuously. The                            TJF-Q190V ([id number]) Olympus duodenoscope was                            introduced through the mouth, and used to inject                            contrast into and used to locate the major papilla.                            The ERCP was technically difficult and complex due                            to challenging cannulation because of abnormal                            anatomy. Successful completion of the procedure was                            aided by performing the maneuvers documented                            (below) in this report. The patient tolerated the                            procedure well. The TJF-Q190V ([id number]) Olympus                            duodenoscope was introduced through the mouth, and                            used to inject contrast into and used to cannulate  the bile duct. Scope In: Scope Out: Findings:      The major papilla was normal. Initially the wire went towards the       pancreas a few times but could not be advanced deep to place a PD stent       and with repositioning of the sphincterotome deep selective cannulation       was readily obtained and no dye was injected into the pancreas and on       initial cholangiogram there was an apparent distal stricture and we       proceeded with a biliary sphincterotomy was made with a Hydratome       sphincterotome using ERBE electrocautery. There was no       post-sphincterotomy bleeding. There was some biliary drainage and we       could get the fully bowed sphincterotome in and out of the duct the       lower third of the main bile duct contained a single localized stenosis.       To better delineate the stricture, the biliary tree was swept with a 9       mm balloon starting at the upper third of the main bile duct wall       injected. Nothing was found specifically no stones  or debris but we did       get some good pictures of the stricture and cells for cytology were       obtained by brushing in the lower third of the main bile duct. One 10 Fr       by 7 cm plastic biliary stent with a single external flap and a single       internal flap was placed into the common bile duct. The stent was       positioned too far downstream (towards the lumen of the GI tract) i.e.       we could not advance it above the stricture and actually the introducer       mechanism released and to recover the stent the scope and the stent were       removed together as well as the wire and the stent was recovered. And       the scope was reinserted and Deep selective cannulation was readily       obtained and we proceeded with dilation of the common bile duct with a 4       cm by 8 mm balloon dilator was successful. One 10 Fr by 5 cm plastic       biliary stent with a single external flap and a single internal flap was       placed 4 cm into the common bile duct. Bile flowed through the stent.       The stent was in questionable good position. We did have lots of       difficulty placing the stent as well but was seeing some biliary       drainage we felt okay stopping the procedure at this point and the       introducer and wire were removed as was the scope and the patient       tolerated the procedure well Impression:               - The major papilla appeared normal.                           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 The                            stricture was indeterminate. This stricture was                            treated with biliary sphincterotomy.                           - A sphincterotomy was performed.                           - The biliary tree was swept and nothing was found.                           - Cells for cytology obtained in the lower third of                             the main duct.                           - One plastic biliary stent was placed into the                            common bile duct. But could not be advanced as                            above so it was removed                           - Common bile duct was successfully dilated.                           - One plastic biliary stent was placed into the                            common bile duct. And hopefully was advanced far                            enough Moderate Sedation:      Not Applicable - Patient had care per Anesthesia. Recommendation:           - Clear liquid diet for 6 hours. If he feels like                            soft solids this evening he may have a                           - Continue present medications.                           -  Return to GI clinic PRN.                           - Telephone GI clinic for pathology results in 4                            days.                           - Telephone GI clinic if symptomatic PRN. Procedure Code(s):        --- Professional ---                           [phone], Endoscopic retrograde                            cholangiopancreatography (ERCP); with placement of                            endoscopic stent into biliary or pancreatic duct,                            including pre- and post-dilation and guide wire                            passage, when performed, including sphincterotomy,                            when performed, each stent                           43274, 16, Endoscopic retrograde                            cholangiopancreatography (ERCP); with placement of                            endoscopic stent into biliary or pancreatic duct,                            including pre- and post-dilation and guide wire                            passage, when performed, including sphincterotomy,                            when performed, each stent Diagnosis Code(s):        --- Professional ---                            K83.1, Obstruction of bile duct                           R17, Unspecified jaundice                           D49.0, Neoplasm of  unspecified behavior of                            digestive system                           R93.5, Abnormal findings on diagnostic imaging of                            other abdominal regions, including retroperitoneum CPT copyright 2019 American [employer] Association. All rights reserved. The codes documented in this report are preliminary and upon coder review may  be revised to meet current compliance requirements. Clarene Essex, MD 07/13/2021 11:06:30 AM This report has been signed electronically. Number of Addenda: 0

## 2021-07-13 NOTE — Progress Notes (Signed)
Pt had one episode for emesis (brown appearence) after dinner meal (clear liquid diet). Pt given IV Zofran.

## 2021-07-13 NOTE — Interval H&P Note (Signed)
History and Physical Interval Note:  07/13/2021 7:31 AM  Leon Fischer.  has presented today for surgery, with the diagnosis of pancreatic mass.  The various methods of treatment have been discussed with the patient and family. After consideration of risks, benefits and other options for treatment, the patient has consented to  Procedure(s): ESOPHAGOGASTRODUODENOSCOPY (EGD) WITH PROPOFOL (N/A) UPPER ENDOSCOPIC ULTRASOUND (EUS) LINEAR (N/A) ENDOSCOPIC RETROGRADE CHOLANGIOPANCREATOGRAPHY (ERCP) WITH PROPOFOL (N/A) as a surgical intervention.  The patient's history has been reviewed, patient examined, no change in status, stable for surgery.  I have reviewed the patient's chart and labs.  Questions were answered to the patient's satisfaction.     [employer]

## 2021-07-13 NOTE — Transfer of Care (Signed)
Immediate Anesthesia Transfer of Care Note  Patient: Leon Fischer.  Procedure(s) Performed: ESOPHAGOGASTRODUODENOSCOPY (EGD) WITH PROPOFOL UPPER ENDOSCOPIC ULTRASOUND (EUS) LINEAR BIOPSY FINE NEEDLE ASPIRATION (FNA) LINEAR ENDOSCOPIC RETROGRADE CHOLANGIOPANCREATOGRAPHY (ERCP) WITH PROPOFOL SPHINCTEROTOMY BILIARY DILATION BILIARY STENT PLACEMENT  Patient Location: PACU  Anesthesia Type:General  Level of Consciousness: awake  Airway & Oxygen Therapy: Patient Spontanous Breathing  Post-op Assessment: Report given to RN  Post vital signs: stable  Last Vitals:  Vitals Value Taken Time  BP 184/92 07/13/21 1051  Temp 36.2 C 07/13/21 1051  Pulse 95 07/13/21 1057  Resp 25 07/13/21 1057  SpO2 98 % 07/13/21 1057  Vitals shown include unvalidated device data.  Last Pain:  Vitals:   07/13/21 1051  TempSrc: Temporal  PainSc: Asleep         Complications: No notable events documented.

## 2021-07-13 NOTE — Progress Notes (Signed)
PROGRESS NOTE Leon Fischer.  WEX:[id number] DOB: 09-26-1990 DOA: 07/11/2021 PCP: Patient, No Pcp Per (Inactive)   Brief Narrative/Hospital Course: 31 y.o. male with medical history significant of asthma, morbid obesity, pancreatic mass/liver lesions. Presenting with N/V. He presented to [facility] service this morning for a scheduled EUS/ERCP for recently discovered pancreatic mass/liver lesions. Prior to the procedure, he had an episode of significant non-bloody emesis. They shut down the procedure. Getting CT ab/pelvis to r/o obstruction. [facility] to attempt procedure again in AM. 6/8-AST/ALT 527/1334 on 5/30> at 296/835, total bili 7.1 6/9-underwent ERCP with plastic stent placement  Subjective: Seen and examined this morning patient has been drowsy after getting procedure this morning family at the bedside. No new complaints requesting pain medication   Assessment and Plan: Principal Problem:   Nausea & vomiting Active Problems:   Asthma   Pancreatic mass   Liver lesion   Obesity, Class III, BMI 40-49.9 (morbid obesity) (HCC)  Nausea & vomiting Pancreatic mass Liver lesion/abnormal LFTs: MRCP reviewed reviewed from 5/29 showing pancreatic head mass.  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 Cont CLD x 6 hr, labs in am, and anticipate dc in am   Asthma: Not in exacerbation   Class III Obesity:Patient's Body mass index is 44.07 kg/m. : Will benefit with PCP follow-up, weight loss  healthy lifestyle and outpatient sleep evaluation.  DVT prophylaxis: SCDs Start:  07/11/21 9381 Code Status:   Code Status: Full Code Family Communication: plan of care discussed with patient at bedside. Patient status is: Observation but will need at least 2 midnight study for ongoing management of pancreatic mass with further work-up including EUS/ERCP  Level of care: Med-Surg   Dispo: The patient is from: HOME            Anticipated disposition: HOME likely tomorrow   Mobility Assessment (last 72 hours)     Mobility Assessment     Row Name 07/12/21 2052 07/11/21 2152 07/11/21 1335       Does patient have an order for bedrest or is patient medically unstable No - Continue assessment No - Continue assessment No - Continue assessment     What is the highest level of mobility based on the progressive mobility assessment? Level 6 (Walks independently in room and hall) - Balance while walking in room without assist - Complete Level 6 (Walks independently in room and hall) - Balance while walking in room without assist - Complete Level 6 (Walks independently in room and hall) - Balance while walking in room without assist - Complete               Objective: Vitals last 24 hrs: Vitals:   07/13/21 1100 07/13/21 1110 07/13/21 1120 07/13/21 1141  BP: (!) 157/88 136/75 (!) 153/92 120/78  Pulse: 88 80 79 89  Resp: (!) 26 (!) 25 (!) 0 16  Temp:    98.1 F (36.7 C)  TempSrc:      SpO2: 97% 97% 96% 99%  Weight:      Height:       Weight change: -0.019 kg  Physical Examination: General exam: AA,older than stated age, weak  appearing. HEENT:Oral mucosa moist, Ear/Nose WNL grossly, dentition normal. Respiratory system: bilaterally diminished,no use of accessory muscle Cardiovascular system: S1 & S2 +, No JVD,. Gastrointestinal system: Abdomen soft,NT,ND, BS+ Nervous System:Alert, awake, moving extremities and grossly nonfocal Extremities: edema neg,distal peripheral pulses palpable.  Skin: No rashes,no icterus. MSK: Normal muscle bulk,tone, power   Medications  reviewed:  Scheduled Meds:  pantoprazole (PROTONIX) IV  40 mg Intravenous Daily   Continuous Infusions:  sodium chloride 75 mL/hr at 07/12/21 1722      Diet Order             Diet clear liquid Fluid consistency: Thin  Diet effective now                            Intake/Output Summary (Last 24 hours) at 07/13/2021 1323 Last data filed at 07/13/2021 0944 Gross per 24 hour  Intake 1270 ml  Output --  Net 1270 ml    Net IO Since Admission: 2,335.57 mL [07/13/21 1323]  Wt Readings from Last 3 Encounters:  07/13/21 (!) 147.4 kg  07/02/21 (!) 147.7 kg  01/14/19 (!) 149.7 kg     Unresulted Labs (From admission, onward)     Start     Ordered   07/13/21 0500  Comprehensive metabolic panel  Daily,   R      07/12/21 1403          Data Reviewed: I have personally reviewed following labs and imaging studies CBC: Recent Labs  Lab 07/11/21 0930 07/12/21 0543  WBC 10.5 7.3  NEUTROABS 8.9*  --   HGB 14.6 13.0  HCT 45.6 40.2  MCV 92.3 89.7  PLT 297 073    Basic Metabolic Panel: Recent Labs  Lab 07/11/21 0930 07/12/21 0543 07/13/21 0526 07/13/21 1135  NA 140 139 142 139  K 3.7 4.0 4.0 4.1  CL 105 103 108 105  CO2 '24 27 26 23  '$ GLUCOSE 124* 92 91 146*  BUN '11 10 9 10  '$ CREATININE 0.86 0.77 0.74 0.89  CALCIUM 10.0 9.5 9.3 10.0    GFR: Estimated Creatinine Clearance: 181.1 mL/min (by C-G formula based on SCr of 0.89 mg/dL). Liver Function Tests: Recent Labs  Lab 07/11/21 0930 07/12/21 0543 07/13/21 0526 07/13/21 1135  AST 314* 296* 251* 293*  ALT 931* 835* 744* 613*  ALKPHOS 393* 345* 327* 366*  BILITOT 8.4* 7.1* 7.4* 8.0*  PROT 8.4* 7.4 7.0 7.9  ALBUMIN 4.2 3.8 3.6 3.9    No results for input(s): "LIPASE", "AMYLASE" in the last 168 hours. No results for input(s): "AMMONIA" in the last 168 hours. Coagulation Profile: No results for input(s): "INR", "PROTIME" in the last 168 hours. BNP (last 3 results) No results for input(s): "PROBNP" in the  last 8760 hours. HbA1C: No results for input(s): "HGBA1C" in the last 72 hours. CBG: No results for input(s): "GLUCAP" in the last 168 hours. Lipid Profile: No results for input(s): "CHOL", "HDL", "LDLCALC", "TRIG", "CHOLHDL", "LDLDIRECT" in the last 72 hours. Thyroid Function Tests: No results for input(s): "TSH", "T4TOTAL", "FREET4", "T3FREE", "THYROIDAB" in the last 72 hours. Sepsis Labs: No results for input(s): "PROCALCITON", "LATICACIDVEN" in the last 168 hours.  No results found for this or any previous visit (from the past 240 hour(s)).  Antimicrobials: Anti-infectives (From admission, onward)    None      Culture/Microbiology No results found for: "SDES", "SPECREQUEST", "CULT", "REPTSTATUS"  Other culture-see note  Radiology Studies: DG ERCP  Result Date: 07/13/2021 CLINICAL DATA:  Pancreatic mass, jaundice and abnormal liver function tests. EXAM: ERCP TECHNIQUE: Multiple spot images obtained with the fluoroscopic device and submitted for interpretation post-procedure. COMPARISON:  CT of the abdomen on 07/11/2021 FINDINGS: Imaging during ERCP demonstrates evidence probable stricture of the distal common bile duct at the level of a known mass involving the pancreatic head. Balloon dilatation was performed followed by placement of an endoscopic biliary stent in the common bile duct. Contrast injection demonstrates mild prominence in caliber of the common bile duct above the level of stricture. IMPRESSION: Distal common bile duct stricture treated by balloon dilatation and placement of an endoscopic biliary stent. These images were submitted for radiologic interpretation only. Please see the procedural report for the amount of contrast and the fluoroscopy time utilized. Electronically Signed   By: Aletta Edouard M.D.   On: 07/13/2021 11:15   DG C-Arm 1-60 Min-No Report  Result Date: 07/13/2021 Fluoroscopy was utilized by the requesting physician.  No radiographic interpretation.      LOS: 0 days   Antonieta Pert, MD [facility]  07/13/2021, 1:23 PM

## 2021-07-13 NOTE — Anesthesia Procedure Notes (Signed)
Procedure Name: Intubation Date/Time: 07/13/2021 7:43 AM  Performed by: Artice Holohan, Forest Gleason, CRNAPre-anesthesia Checklist: Patient identified, Emergency Drugs available, Suction available, Patient being monitored and Timeout performed Patient Re-evaluated:Patient Re-evaluated prior to induction Oxygen Delivery Method: Circle system utilized Preoxygenation: Pre-oxygenation with 100% oxygen Induction Type: IV induction Ventilation: Mask ventilation without difficulty Laryngoscope Size: 4 Grade View: Grade I Tube type: Oral Tube size: 7.5 mm Number of attempts: 1 Airway Equipment and Method: Stylet Placement Confirmation: ETT inserted through vocal cords under direct vision, positive ETCO2, CO2 detector and breath sounds checked- equal and bilateral Secured at: 24 cm Tube secured with: Tape Dental Injury: Teeth and Oropharynx as per pre-operative assessment

## 2021-07-14 DIAGNOSIS — R112 Nausea with vomiting, unspecified: Secondary | ICD-10-CM | POA: Diagnosis not present

## 2021-07-14 LAB — COMPREHENSIVE METABOLIC PANEL
ALT: 659 U/L — ABNORMAL HIGH (ref 0–44)
AST: 191 U/L — ABNORMAL HIGH (ref 15–41)
Albumin: 3.5 g/dL (ref 3.5–5.0)
Alkaline Phosphatase: 314 U/L — ABNORMAL HIGH (ref 38–126)
Anion gap: 8 (ref 5–15)
BUN: 7 mg/dL (ref 6–20)
CO2: 25 mmol/L (ref 22–32)
Calcium: 9.3 mg/dL (ref 8.9–10.3)
Chloride: 109 mmol/L (ref 98–111)
Creatinine, Ser: 0.8 mg/dL (ref 0.61–1.24)
GFR, Estimated: >60 mL/min (ref >60)
Glucose, Bld: 99 mg/dL (ref 70–99)
Potassium: 3.8 mmol/L (ref 3.5–5.1)
Sodium: 142 mmol/L (ref 135–145)
Total Bilirubin: 3.9 mg/dL — ABNORMAL HIGH (ref 0.3–1.2)
Total Protein: 7.1 g/dL (ref 6.5–8.1)

## 2021-07-14 MED ORDER — PANTOPRAZOLE SODIUM 40 MG PO TBEC
40.0000 mg | DELAYED_RELEASE_TABLET | Freq: Every day | ORAL | Status: DC
  Administered 2021-07-15: 40 mg via ORAL
  Filled 2021-07-14: qty 1

## 2021-07-14 MED ORDER — ENOXAPARIN SODIUM 80 MG/0.8ML IJ SOSY
70.0000 mg | PREFILLED_SYRINGE | INTRAMUSCULAR | Status: DC
  Administered 2021-07-14: 70 mg via SUBCUTANEOUS
  Filled 2021-07-14: qty 0.8

## 2021-07-14 NOTE — Progress Notes (Signed)
PROGRESS NOTE  Leon Fischer. ELF:[id number] DOB: Dec 06, 1990 DOA: 07/11/2021 PCP: Patient, No Pcp Per (Inactive)  HPI/Recap of past 24 hours: 31 y.o. male with medical history significant of asthma, morbid obesity, pancreatic mass/liver lesions who initially presented to [facility] service for a scheduled EUS/ERCP for recently discovered pancreatic mass/liver lesions. Prior to the procedure, he had an episode of significant non-bloody emesis. They shut down the procedure.  07/13/21-underwent ERCP with plastic stent placement.  07/14/21: The patient was seen and examined at his bedside.  He reports persistent nausea and right upper/lower quadrant pain.  Started on clear liquid diet.  We will advance his diet and see if he tolerates prior to discharge.  Continue IV fluid hydration.  Anticipated discharge date 07/15/2021 if can tolerate a diet  Assessment/Plan: Principal Problem:   Nausea & vomiting Active Problems:   Asthma   Pancreatic mass   Liver lesion   Obesity, Class III, BMI 40-49.9 (morbid obesity) (HCC)   Gastritis and gastroduodenitis  Nausea & vomiting Pancreatic mass Liver lesion/abnormal LFTs: MRCP reviewed reviewed from 5/29 showing pancreatic head mass.  CT abdomen 6/7 gas/fluid throughout the colon suggestive of diarrheal illness, no interval change in hypodense/hypoenhancing masslike area in the pancreatic head with ductal dilatation,enlarged upper abdominal lymph nodes and subtle hypodense hepatic lesions. 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  Plastic biliary stent was placed in the CBD.  Analgesics as needed  Elevated liver chemistries in the setting of pancreatic mass and liver lesions. Alkaline phosphatase, T. bili, and AST downtrending. Mild elevation of ALT Repeat chemistry panel in the morning.   Asthma: Not in exacerbation Continue home  regimen  GERD Resume home PPI, switch back to oral Protonix   Class III Obesity:Patient's Body mass index is 44.07 kg/m. : Will benefit with PCP follow-up, weight loss  healthy lifestyle and outpatient sleep evaluation. Recommend weight loss outpatient with regular physical activity and healthy dieting.   DVT prophylaxis: SCDs Start: 07/11/21 0927, subcu Lovenox daily. Code Status:   Code Status: Full Code Family Communication: plan of care discussed with patient at bedside. Patient status is: Observation but will need at least 2 midnight study for ongoing management of pancreatic mass with further work-up including EUS/ERCP  Level of care: Med-Surg    Dispo: The patient is from: HOME            Anticipated disposition: HOME likely tomorrow    Mobility Assessment (last 72 hours)       Mobility Assessment          Status is: Inpatient The patient requires at least 2 midnights for further evaluation and treatment of present condition.    Objective: Vitals:   07/13/21 1141 07/13/21 2050 07/14/21 0455 07/14/21 1426  BP: 120/78 (!) 165/117 (!) 147/88 134/90  Pulse: 89 78 80 78  Resp: '16 18 18 18  '$ Temp: 98.1 F (36.7 C) 98.1 F (36.7 C) 97.6 F (36.4 C) 97.8 F (36.6 C)  TempSrc:  Oral Oral Oral  SpO2: 99% 100% 98% 100%  Weight:      Height:        Intake/Output Summary (Last 24 hours) at 07/14/2021 1520 Last data filed at 07/13/2021 2100 Gross per 24 hour  Intake 3393.63 ml  Output 1 ml  Net 3392.63 ml   Filed Weights   07/11/21 0732 07/13/21 0654  Weight: (!) 147.4 kg Marland Kitchen)  147.4 kg    Exam:  General: 31 y.o. year-old male well developed well nourished in no acute distress.  Alert and oriented x3. Cardiovascular: Regular rate and rhythm with no rubs or gallops.  No thyromegaly or JVD noted.   Respiratory: Clear to auscultation with no wheezes or rales. Good inspiratory effort. Abdomen: Soft tenderness with palpation right upper quadrant and right lower  quadrant.  Nondistended with normal bowel sounds x4 quadrants. Musculoskeletal: No lower extremity edema. 2/4 pulses in all 4 extremities. Skin: No ulcerative lesions noted or rashes, Psychiatry: Mood is appropriate for condition and setting   Data Reviewed: CBC: Recent Labs  Lab 07/11/21 0930 07/12/21 0543  WBC 10.5 7.3  NEUTROABS 8.9*  --   HGB 14.6 13.0  HCT 45.6 40.2  MCV 92.3 89.7  PLT 297 923   Basic Metabolic Panel: Recent Labs  Lab 07/11/21 0930 07/12/21 0543 07/13/21 0526 07/13/21 1135 07/14/21 0605  NA 140 139 142 139 142  K 3.7 4.0 4.0 4.1 3.8  CL 105 103 108 105 109  CO2 '24 27 26 23 25  '$ GLUCOSE 124* 92 91 146* 99  BUN '11 10 9 10 7  '$ CREATININE 0.86 0.77 0.74 0.89 0.80  CALCIUM 10.0 9.5 9.3 10.0 9.3   GFR: Estimated Creatinine Clearance: 201.5 mL/min (by C-G formula based on SCr of 0.8 mg/dL). Liver Function Tests: Recent Labs  Lab 07/11/21 0930 07/12/21 0543 07/13/21 0526 07/13/21 1135 07/14/21 0605  AST 314* 296* 251* 293* 191*  ALT 931* 835* 744* 613* 659*  ALKPHOS 393* 345* 327* 366* 314*  BILITOT 8.4* 7.1* 7.4* 8.0* 3.9*  PROT 8.4* 7.4 7.0 7.9 7.1  ALBUMIN 4.2 3.8 3.6 3.9 3.5   No results for input(s): "LIPASE", "AMYLASE" in the last 168 hours. No results for input(s): "AMMONIA" in the last 168 hours. Coagulation Profile: No results for input(s): "INR", "PROTIME" in the last 168 hours. Cardiac Enzymes: No results for input(s): "CKTOTAL", "CKMB", "CKMBINDEX", "TROPONINI" in the last 168 hours. BNP (last 3 results) No results for input(s): "PROBNP" in the last 8760 hours. HbA1C: No results for input(s): "HGBA1C" in the last 72 hours. CBG: No results for input(s): "GLUCAP" in the last 168 hours. Lipid Profile: No results for input(s): "CHOL", "HDL", "LDLCALC", "TRIG", "CHOLHDL", "LDLDIRECT" in the last 72 hours. Thyroid Function Tests: No results for input(s): "TSH", "T4TOTAL", "FREET4", "T3FREE", "THYROIDAB" in the last 72  hours. Anemia Panel: No results for input(s): "VITAMINB12", "FOLATE", "FERRITIN", "TIBC", "IRON", "RETICCTPCT" in the last 72 hours. Urine analysis:    Component Value Date/Time   COLORURINE AMBER (A) 07/02/2021 1220   APPEARANCEUR CLEAR 07/02/2021 1220   LABSPEC 1.015 07/02/2021 1220   PHURINE 6.0 07/02/2021 1220   GLUCOSEU NEGATIVE 07/02/2021 1220   HGBUR TRACE (A) 07/02/2021 1220   BILIRUBINUR SMALL (A) 07/02/2021 1220   KETONESUR NEGATIVE 07/02/2021 1220   PROTEINUR NEGATIVE 07/02/2021 1220   NITRITE NEGATIVE 07/02/2021 1220   LEUKOCYTESUR NEGATIVE 07/02/2021 1220   Sepsis Labs: '@LABRCNTIP'$ (procalcitonin:4,lacticidven:4)  )No results found for this or any previous visit (from the past 240 hour(s)).    Studies: No results found.  Scheduled Meds:  pantoprazole (PROTONIX) IV  40 mg Intravenous Daily    Continuous Infusions:  sodium chloride 75 mL/hr at 07/14/21 1332     LOS: 1 day     Kayleen Memos, MD [facility] Pager [phone]  If 7PM-7AM, please contact night-coverage [email or web address] Password [email or web address] 07/14/2021, 3:20 PM

## 2021-07-14 NOTE — Progress Notes (Addendum)
Leon Fischer. 11:04 AM  Subjective: Patient seen and examined no obvious postprocedural complications but he still does not want to advance his diet and we talked to his mother on the phone and answered all of their questions  Objective: Vital signs stable afebrile no acute distress abdomen is soft nontender liver test improved  Assessment: Pancreatic mass CBD obstruction  Plan: Okay with me to go home we discussed slowly advancing his diet there and he will call us as needed otherwise we will talk on the phone Monday or Tuesday as soon as the biopsies come back and decide how to proceed at that point we will repeat blood work as an outpatient as well and he will need a note for staying out of work next week however if he is not discharged we will check on tomorrow and explained to them that we do not expect his liver test to go all the way back to normal but there should be a nice sometimes slow decrease like there was today  [facility]  office [phone] After 5PM or if no answer call [phone]

## 2021-07-15 DIAGNOSIS — R112 Nausea with vomiting, unspecified: Secondary | ICD-10-CM | POA: Diagnosis not present

## 2021-07-15 LAB — COMPREHENSIVE METABOLIC PANEL
ALT: 601 U/L — ABNORMAL HIGH (ref 0–44)
AST: 170 U/L — ABNORMAL HIGH (ref 15–41)
Albumin: 3.6 g/dL (ref 3.5–5.0)
Alkaline Phosphatase: 260 U/L — ABNORMAL HIGH (ref 38–126)
Anion gap: 8 (ref 5–15)
BUN: 7 mg/dL (ref 6–20)
CO2: 25 mmol/L (ref 22–32)
Calcium: 9 mg/dL (ref 8.9–10.3)
Chloride: 109 mmol/L (ref 98–111)
Creatinine, Ser: 0.73 mg/dL (ref 0.61–1.24)
GFR, Estimated: >60 mL/min (ref >60)
Glucose, Bld: 94 mg/dL (ref 70–99)
Potassium: 3.6 mmol/L (ref 3.5–5.1)
Sodium: 142 mmol/L (ref 135–145)
Total Bilirubin: 3.1 mg/dL — ABNORMAL HIGH (ref 0.3–1.2)
Total Protein: 7.1 g/dL (ref 6.5–8.1)

## 2021-07-15 NOTE — Progress Notes (Signed)
Leon Fischer. 10:03 AM  Subjective: Patient doing better eating more no pain no new complaints we again discussed liver tests and his stent and how we are looking for trends in their direction and not returning all the way back to normal and certainly not right away  Objective: Vital signs stable afebrile no acute distress exam unchanged liver test decreased  Assessment: Pancreatic mass with obstruction  Plan: He will call us as needed as an outpatient otherwise as soon as pathology back we will contact him and set up the plan from there and he was instructed to call me Tuesday afternoon if he has not heard from my and will repeat labs at some point as an outpatient next week depending on above  [facility]  office [phone] After 5PM or if no answer call [phone]

## 2021-07-15 NOTE — Discharge Summary (Addendum)
Discharge Summary  Leon Fischer. FUX:[id number] DOB: 12-25-1990  PCP: Patient, No Pcp Per (Inactive)  Admit date: 07/11/2021 Discharge date: 07/15/2021  Time spent: 35 minutes   Recommendations for Outpatient Follow-up:  Follow up with GI within a week Follow up with your PCP in 1 to 2 weeks Take your medications as prescribed   Discharge Diagnoses:  Active Hospital Problems   Diagnosis Date Noted   Nausea & vomiting 07/11/2021   Gastritis and gastroduodenitis    Obesity, Class III, BMI 40-49.9 (morbid obesity) ([place]) 07/11/2021   Liver lesion 07/05/2021   Pancreatic mass 07/02/2021   Asthma 05/13/2012    Resolved Hospital Problems  No resolved problems to display.    Discharge Condition: Stable   Diet recommendation: Resume previous diet   Vitals:   07/14/21 1954 07/15/21 0336  BP: (!) 142/88 131/75  Pulse: 72 99  Resp: 18 16  Temp: 98.7 F (37.1 C) 98.3 F (36.8 C)  SpO2: 100% 100%    History of present illness:  31 y.o. male with medical history significant of asthma, morbid obesity, pancreatic mass/liver lesions who initially presented to [facility] service for a scheduled EUS/ERCP for recently discovered pancreatic mass/liver lesions. Prior to the procedure, he had an episode of significant non-bloody emesis. The procedure was postponed.  07/13/21-The patient underwent ERCP with plastic stent placement.  He tolerated well.   07/15/21: [name] has no new complaints.  He is tolerating a diet well.  No nausea.  No significant abdominal pain.  Hospital Course:  Principal Problem:   Nausea & vomiting Active Problems:   Asthma   Pancreatic mass   Liver lesion   Obesity, Class III, BMI 40-49.9 (morbid obesity) ([facility])   Gastritis and gastroduodenitis  Resolved nausea & vomiting. Pancreatic mass with obstruction. Liver lesion/abnormal LFTs. MRCP reviewed from 5/29 showing pancreatic head mass.  CT abdomen 6/7 gas/fluid throughout  the colon suggestive of diarrheal illness, no interval change in hypodense/hypoenhancing masslike area in the pancreatic head with ductal dilatation, enlarged upper abdominal lymph nodes and subtle hypodense hepatic lesions. 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  Plastic biliary stent was placed in the CBD.  Follow-up with GI.  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 Alkaline phosphatase, T. bili, and AST downtrending. Follow-up with GI   Asthma: Not in exacerbation Continue home regimen Follow-up with your PCP     Code Status:   Code Status: Full Code  Family Communication: Mother at bedside on the day of discharge.   Consultations: GI   Discharge Exam: BP 131/75 (BP Location: Left Arm)   Pulse 99   Temp 98.3 F (36.8 C) (Oral)   Resp 16   Ht 6' (1.829 m)   Wt (!) 147.4 kg   SpO2 100%   BMI 44.07 kg/m  General: 31 y.o. year-old male well developed well nourished in no acute distress.  Alert and oriented x3. Cardiovascular: Regular rate and rhythm with no rubs or gallops.  No thyromegaly or JVD noted.   Respiratory: Clear to auscultation with no wheezes or rales. Good inspiratory effort. Abdomen: Soft nontender nondistended with normal bowel sounds x4 quadrants. Musculoskeletal: No lower extremity edema. 2/4 pulses in all 4 extremities. Skin: No  ulcerative lesions noted or rashes, Psychiatry: Mood is appropriate for condition and setting  Discharge Instructions You were cared for by a hospitalist during your hospital stay. If you have any questions about your discharge medications or the care you received while you were in the hospital after you  are discharged, you can call the unit and asked to speak with the hospitalist on call if the hospitalist that took care of you is not available. Once you are discharged, your primary care physician will handle any further medical issues. Please note that NO REFILLS for any discharge medications will be authorized once you are discharged, as it is imperative that you return to your primary care physician (or establish a relationship with a primary care physician if you do not have one) for your aftercare needs so that they can reassess your need for medications and monitor your lab values.   Allergies as of 07/15/2021       Reactions   Shellfish Allergy Swelling, Shortness Of Breath   Eyes, throat swell shut.   Other         Medication List     TAKE these medications    albuterol 108 (90 Base) MCG/ACT inhaler Commonly known as: VENTOLIN HFA Inhale 2 puffs into the lungs every 4 (four) hours as needed for wheezing or shortness of breath. What changed: Another medication with the same name was removed. Continue taking this medication, and follow the directions you see here.   oxyCODONE 5 MG immediate release tablet Commonly known as: Oxy IR/ROXICODONE Take 1 tablet (5 mg total) by mouth every 6 (six) hours as needed for moderate pain or breakthrough pain.   pantoprazole 40 MG tablet Commonly known as: Protonix Take 1 tablet (40 mg total) by mouth daily.   polyethylene glycol 17 g packet Commonly known as: MIRALAX / GLYCOLAX Take 17 g by mouth daily.   senna-docusate 8.6-50 MG tablet Commonly known as: Senokot-S Take 2 tablets by mouth 2 (two) times daily.       Allergies  Allergen Reactions   Shellfish Allergy Swelling and Shortness Of Breath    Eyes, throat swell shut.    Other     Follow-up Information     Leon Essex, MD. Call today.   Specialty: Gastroenterology Why: Please call for a post hospital follow up appointment. Contact information: [address] [place]  [address] [phone]                  The results of significant diagnostics from this hospitalization (including imaging, microbiology, ancillary and laboratory) are listed below for reference.    Significant Diagnostic Studies: DG ERCP  Result Date: 07/13/2021 CLINICAL DATA:  Pancreatic mass, jaundice and abnormal liver function tests. EXAM: ERCP TECHNIQUE: Multiple spot images obtained with the fluoroscopic device and submitted for interpretation post-procedure. COMPARISON:  CT of the abdomen on 07/11/2021 FINDINGS: Imaging during ERCP demonstrates evidence probable stricture of the distal common bile duct at the level of a known mass involving the pancreatic head. Balloon dilatation was performed followed by placement of an endoscopic biliary stent in the common bile duct. Contrast injection demonstrates mild prominence in caliber of the common bile duct above the level of stricture. IMPRESSION: Distal common bile duct stricture treated by balloon dilatation and placement of an endoscopic biliary stent. These images were submitted for radiologic interpretation only. Please see the procedural report for the amount of contrast and the fluoroscopy time utilized. Electronically Signed  By: Aletta Edouard M.D.   On: 07/13/2021 11:15   DG C-Arm 1-60 Min-No Report  Result Date: 07/13/2021 Fluoroscopy was utilized by the requesting physician.  No radiographic interpretation.   CT ABDOMEN PELVIS W CONTRAST  Result Date: 07/11/2021 CLINICAL DATA:  Bowel obstruction suspected. EXAM: CT ABDOMEN AND PELVIS WITH CONTRAST TECHNIQUE: Multidetector CT imaging of the abdomen and pelvis was performed using the standard protocol following bolus administration of intravenous contrast. RADIATION DOSE REDUCTION: This exam was performed according to the departmental dose-optimization program which includes automated exposure control, adjustment of the mA and/or kV according to  patient size and/or use of iterative reconstruction technique. CONTRAST:  175m OMNIPAQUE IOHEXOL 300 MG/ML  SOLN COMPARISON:  CT and MRI Jul 02, 2021 FINDINGS: Lower chest: No acute abnormality. Hepatobiliary: No significant interval change in the subtle hypodense hepatic lesions measuring up to 1.3 cm in the right lobe of the liver on image 21/2. Gallbladder is unremarkable. Similar mild prominence of the biliary tree without dilation. Pancreas: No significant interval change in the ill-defined hypoenhancing masslike area along the posterior pancreatic head which measures approximally 2.7 cm on image 32/2. Similar mild pancreatic ductal dilation without significant pancreatic atrophy. Spleen: No splenomegaly or focal splenic lesion. Adrenals/Urinary Tract: Bilateral adrenal glands appear normal. No hydronephrosis. Kidneys demonstrate symmetric enhancement. No suspicious renal mass. Stomach/Bowel: Stomach is unremarkable for degree of distension. No pathologic dilation of small or large bowel. Terminal ileum appears normal. Normal appendix. Gas fluid levels throughout the colon suggestive of diarrheal illness. Vascular/Lymphatic: Normal caliber abdominal aorta. Enlarged retroperitoneal/upper abdominal lymph nodes are again seen an unchanged for instance an aortocaval lymph node measuring 15 mm in short axis on image 34/2. Reproductive: Prostate is unremarkable. Other: No significant abdominopelvic free fluid. Musculoskeletal: No aggressive lytic or blastic lesion of bone. IMPRESSION: 1. Gas fluid levels throughout the colon suggestive of diarrheal illness. No evidence of bowel obstruction. 2. No significant interval change in the hypodense/hypoenhancing masslike area in the pancreatic head with associated ductal dilation. 3. Stable enlarged upper abdominal lymph nodes and subtle hypodense hepatic lesions. Electronically Signed   By: JDahlia BailiffM.D.   On: 07/11/2021 13:03   CT CHEST W CONTRAST  Result Date:  07/03/2021 CLINICAL DATA:  Pancreatic carcinoma staging EXAM: CT CHEST WITH CONTRAST TECHNIQUE: Multidetector CT imaging of the chest was performed during intravenous contrast administration. RADIATION DOSE REDUCTION: This exam was performed according to the departmental dose-optimization program which includes automated exposure control, adjustment of the mA and/or kV according to patient size and/or use of iterative reconstruction technique. CONTRAST:  711mOMNIPAQUE IOHEXOL 300 MG/ML  SOLN COMPARISON:  None Available. FINDINGS: Cardiovascular: There is homogeneous enhancement in thoracic aorta. There are no filling defects in the central pulmonary artery branches. Mediastinum/Nodes: No significant lymphadenopathy is seen. Lungs/Pleura: There is no focal pulmonary consolidation. There are no discrete lung nodules. Small linear densities in the right posterior costophrenic angle may suggest minimal scarring or subsegmental atelectasis. There is no pleural effusion or pneumothorax. Upper Abdomen: There is 2.6 cm ill-defined low-attenuation lesion at the junction of head and body of pancreas. There is dilation of pancreatic duct in the body and tail. There is no dilation of bile ducts. Gallbladder is distended. There is no wall thickening in gallbladder. Small space-occupying lesions seen in the liver in the previous MRI could not be distinctly identified in the CT images. Musculoskeletal: There are no focal lytic or sclerotic lesions. Degenerative changes are noted in the thoracic spine. Gynecomastia  is seen in both breasts. IMPRESSION: No significant abnormality is seen in the CT scan of chest. There is no significant lymphadenopathy. There are no discrete lung nodules. Low-density lesion seen in the pancreas is better evaluated in the MRI examination. Electronically Signed   By: Elmer Picker M.D.   On: 07/03/2021 16:08   MR ABDOMEN MRCP W WO CONTAST  Result Date: 07/02/2021 CLINICAL DATA:  Abdominal  pain, nausea/vomiting, possible pancreatic mass on CT EXAM: MRI ABDOMEN WITHOUT AND WITH CONTRAST (INCLUDING MRCP) TECHNIQUE: Multiplanar multisequence MR imaging of the abdomen was performed both before and after the administration of intravenous contrast. Heavily T2-weighted images of the biliary and pancreatic ducts were obtained, and three-dimensional MRCP images were rendered by post processing. CONTRAST:  27m GADAVIST GADOBUTROL 1 MMOL/ML IV SOLN COMPARISON:  CT abdomen/pelvis dated 07/02/2021 FINDINGS: Motion degraded images. Lower chest: Lung bases are clear. Hepatobiliary: Two subtle liver lesions, measuring 11 mm in segment 8 (series 4/image 9) and 12 mm in segment 5 along the gallbladder fossa (series 4/image 21). These are poorly evaluated due to motion degradation, but enhancement is favored at least within the segment 5 lesion on postcontrast subtraction imaging (series 31/image 62). Associated restricted diffusion. Three additional smaller lesions are present on diffusion imaging (series 8/images 55 and 63). These findings are concerning for metastatic disease. Gallbladder is unremarkable. No cholelithiasis or inflammatory changes. No intrahepatic or extrahepatic ductal dilatation. No choledocholithiasis is evident. Pancreas: 3.1 x 2.3 cm hypoenhancing mass along the posteromedial aspect of the pancreatic head (series 22/image 67), suspicious for pancreatic neoplasm. Associated mild dilatation of the main pancreatic duct (series 4/image 20), which abruptly ends at the level of the suspected mass (series 4/image 23). No pancreatic atrophy. Spleen:  Within normal limits. Adrenals/Urinary Tract:  Adrenal glands are within normal limits. Kidneys are within normal limits.  No hydronephrosis. Stomach/Bowel: Stomach is within normal limits. Visualized bowel is unremarkable. Vascular/Lymphatic:  No evidence of abdominal aortic aneurysm. Celiac artery, SMA, and portal vein appear distinct from the mass,  without definite vascular invasion. Upper abdominal lymphadenopathy, including a 16 mm short axis portacaval node (series 4/image 21) and a 17 mm short axis right retrocaval node at the level of the right renal vein. Other:  No abdominal ascites. Musculoskeletal: No focal osseous lesions. IMPRESSION: 3.1 x 2.3 cm hypoenhancing mass along the posteromedial aspect of the pancreatic head, suspicious for pancreatic neoplasm. EUS is suggested. Multiple small liver lesions, suspicious for hepatic metastases. Upper abdominal/retroperitoneal lymphadenopathy, suspicious for nodal metastases. Electronically Signed   By: SJulian HyM.D.   On: 07/02/2021 20:16   MR 3D Recon At Scanner  Result Date: 07/02/2021 CLINICAL DATA:  Abdominal pain, nausea/vomiting, possible pancreatic mass on CT EXAM: MRI ABDOMEN WITHOUT AND WITH CONTRAST (INCLUDING MRCP) TECHNIQUE: Multiplanar multisequence MR imaging of the abdomen was performed both before and after the administration of intravenous contrast. Heavily T2-weighted images of the biliary and pancreatic ducts were obtained, and three-dimensional MRCP images were rendered by post processing. CONTRAST:  117mGADAVIST GADOBUTROL 1 MMOL/ML IV SOLN COMPARISON:  CT abdomen/pelvis dated 07/02/2021 FINDINGS: Motion degraded images. Lower chest: Lung bases are clear. Hepatobiliary: Two subtle liver lesions, measuring 11 mm in segment 8 (series 4/image 9) and 12 mm in segment 5 along the gallbladder fossa (series 4/image 21). These are poorly evaluated due to motion degradation, but enhancement is favored at least within the segment 5 lesion on postcontrast subtraction imaging (series 31/image 62). Associated restricted diffusion. Three additional smaller lesions are  present on diffusion imaging (series 8/images 55 and 63). These findings are concerning for metastatic disease. Gallbladder is unremarkable. No cholelithiasis or inflammatory changes. No intrahepatic or extrahepatic ductal  dilatation. No choledocholithiasis is evident. Pancreas: 3.1 x 2.3 cm hypoenhancing mass along the posteromedial aspect of the pancreatic head (series 22/image 67), suspicious for pancreatic neoplasm. Associated mild dilatation of the main pancreatic duct (series 4/image 20), which abruptly ends at the level of the suspected mass (series 4/image 23). No pancreatic atrophy. Spleen:  Within normal limits. Adrenals/Urinary Tract:  Adrenal glands are within normal limits. Kidneys are within normal limits.  No hydronephrosis. Stomach/Bowel: Stomach is within normal limits. Visualized bowel is unremarkable. Vascular/Lymphatic:  No evidence of abdominal aortic aneurysm. Celiac artery, SMA, and portal vein appear distinct from the mass, without definite vascular invasion. Upper abdominal lymphadenopathy, including a 16 mm short axis portacaval node (series 4/image 21) and a 17 mm short axis right retrocaval node at the level of the right renal vein. Other:  No abdominal ascites. Musculoskeletal: No focal osseous lesions. IMPRESSION: 3.1 x 2.3 cm hypoenhancing mass along the posteromedial aspect of the pancreatic head, suspicious for pancreatic neoplasm. EUS is suggested. Multiple small liver lesions, suspicious for hepatic metastases. Upper abdominal/retroperitoneal lymphadenopathy, suspicious for nodal metastases. Electronically Signed   By: Julian Hy M.D.   On: 07/02/2021 20:16   CT ABDOMEN PELVIS W CONTRAST  Result Date: 07/02/2021 CLINICAL DATA:  Abdominal pain radiating to the back for 2 weeks, worse over the last 2 days. Some nausea vomiting. EXAM: CT ABDOMEN AND PELVIS WITH CONTRAST TECHNIQUE: Multidetector CT imaging of the abdomen and pelvis was performed using the standard protocol following bolus administration of intravenous contrast. RADIATION DOSE REDUCTION: This exam was performed according to the departmental dose-optimization program which includes automated exposure control, adjustment of the  mA and/or kV according to patient size and/or use of iterative reconstruction technique. CONTRAST:  156m OMNIPAQUE IOHEXOL 300 MG/ML  SOLN COMPARISON:  None Available. FINDINGS: Lower chest: Clear lung bases. Hepatobiliary: Liver normal in size. There are several subtle hypoattenuating liver lesions, not well-defined, most apparent at the lateral dome of segment 7, proximally 1.1 cm in size. No other liver abnormality. Gallbladder is distended, but without wall thickening or pericholecystic fluid. Common bile duct is mildly dilated, 8 mm proximally. Pancreas: There is a dystrophic appearing calcification in the upper pancreatic head surrounded by vague area of relative hypoattenuation, which may reflect a mass. The possible mass measures 2.6 cm. No other evidence of a pancreatic mass. The pancreatic duct distal to the possible mass is dilated to 4 mm. No pancreatic inflammation. Spleen: Normal in size without focal abnormality. Adrenals/Urinary Tract: Adrenal glands are unremarkable. Kidneys are normal, without renal calculi, focal lesion, or hydronephrosis. Bladder is unremarkable. Stomach/Bowel: Stomach is within normal limits. Appendix appears normal. No evidence of bowel wall thickening, distention, or inflammatory changes. Vascular/Lymphatic: Mildly enlarged retroperitoneal lymph nodes at the level of the upper pole the right kidney, posterior to the inferior vena cava, 1.5 x 1.4 cm in short axis ease. Mildly prominent, but subcentimeter peripancreatic and celiac lymph nodes. No other adenopathy. No vascular abnormality. Portal vein, splenic vein and superior mesenteric vein are widely patent. Reproductive: Unremarkable. Other: No abdominal wall hernia or abnormality. No abdominopelvic ascites. Musculoskeletal: No fracture or acute finding.  No bone lesion. IMPRESSION: 1. Possible 2.6 cm pancreatic mass suspicious for neoplasm, associated with mild dilation of the pancreatic duct distal to the apparent mass,  mild dilation of the  common bile duct and 2 adjacent enlarged retroperitoneal lymph nodes. There are also subtle low-attenuation lesions in the liver that are not well-defined, possibly metastatic disease. Recommend follow-up pancreatic MRI without and with contrast for further assessment. 2. No other abnormalities.  No acute findings. Electronically Signed   By: Lajean Manes M.D.   On: 07/02/2021 13:50    Microbiology: No results found for this or any previous visit (from the past 240 hour(s)).   Labs: Basic Metabolic Panel: Recent Labs  Lab 07/12/21 0543 07/13/21 0526 07/13/21 1135 07/14/21 0605 07/15/21 0618  NA 139 142 139 142 142  K 4.0 4.0 4.1 3.8 3.6  CL 103 108 105 109 109  CO2 '27 26 23 25 25  '$ GLUCOSE 92 91 146* 99 94  BUN '10 9 10 7 7  '$ CREATININE 0.77 0.74 0.89 0.80 0.73  CALCIUM 9.5 9.3 10.0 9.3 9.0   Liver Function Tests: Recent Labs  Lab 07/12/21 0543 07/13/21 0526 07/13/21 1135 07/14/21 0605 07/15/21 0618  AST 296* 251* 293* 191* 170*  ALT 835* 744* 613* 659* 601*  ALKPHOS 345* 327* 366* 314* 260*  BILITOT 7.1* 7.4* 8.0* 3.9* 3.1*  PROT 7.4 7.0 7.9 7.1 7.1  ALBUMIN 3.8 3.6 3.9 3.5 3.6   No results for input(s): "LIPASE", "AMYLASE" in the last 168 hours. No results for input(s): "AMMONIA" in the last 168 hours. CBC: Recent Labs  Lab 07/11/21 0930 07/12/21 0543  WBC 10.5 7.3  NEUTROABS 8.9*  --   HGB 14.6 13.0  HCT 45.6 40.2  MCV 92.3 89.7  PLT 297 281   Cardiac Enzymes: No results for input(s): "CKTOTAL", "CKMB", "CKMBINDEX", "TROPONINI" in the last 168 hours. BNP: BNP (last 3 results) No results for input(s): "BNP" in the last 8760 hours.  ProBNP (last 3 results) No results for input(s): "PROBNP" in the last 8760 hours.  CBG: No results for input(s): "GLUCAP" in the last 168 hours.     Signed:  Kayleen Memos, MD [facility] 07/15/2021, 5:16 PM

## 2021-07-16 ENCOUNTER — Encounter (HOSPITAL_COMMUNITY): Payer: Self-pay | Admitting: Gastroenterology

## 2021-07-16 LAB — SURGICAL PATHOLOGY

## 2021-07-17 ENCOUNTER — Encounter: Payer: Self-pay | Admitting: Gastroenterology

## 2021-07-18 ENCOUNTER — Encounter: Payer: Self-pay | Admitting: Gastroenterology

## 2021-07-18 ENCOUNTER — Other Ambulatory Visit: Payer: Self-pay

## 2021-07-18 DIAGNOSIS — C259 Malignant neoplasm of pancreas, unspecified: Secondary | ICD-10-CM

## 2021-07-18 DIAGNOSIS — K8689 Other specified diseases of pancreas: Secondary | ICD-10-CM

## 2021-07-18 LAB — CYTOLOGY - NON PAP

## 2021-07-19 ENCOUNTER — Other Ambulatory Visit: Payer: Self-pay | Admitting: Hematology

## 2021-07-20 ENCOUNTER — Other Ambulatory Visit: Payer: Self-pay | Admitting: *Deleted

## 2021-07-20 DIAGNOSIS — K8689 Other specified diseases of pancreas: Secondary | ICD-10-CM

## 2021-07-23 ENCOUNTER — Inpatient Hospital Stay: Payer: 59

## 2021-07-23 ENCOUNTER — Inpatient Hospital Stay: Payer: 59 | Attending: Hematology | Admitting: Hematology

## 2021-07-23 ENCOUNTER — Encounter: Payer: Self-pay | Admitting: Hematology

## 2021-07-23 ENCOUNTER — Other Ambulatory Visit: Payer: Self-pay

## 2021-07-23 VITALS — BP 125/85 | HR 98 | Temp 98.1°F | Resp 20 | Wt 308.2 lb

## 2021-07-23 DIAGNOSIS — Z5189 Encounter for other specified aftercare: Secondary | ICD-10-CM | POA: Diagnosis not present

## 2021-07-23 DIAGNOSIS — Z7189 Other specified counseling: Secondary | ICD-10-CM

## 2021-07-23 DIAGNOSIS — C7A1 Malignant poorly differentiated neuroendocrine tumors: Secondary | ICD-10-CM | POA: Insufficient documentation

## 2021-07-23 DIAGNOSIS — K8689 Other specified diseases of pancreas: Secondary | ICD-10-CM

## 2021-07-23 DIAGNOSIS — Z79899 Other long term (current) drug therapy: Secondary | ICD-10-CM | POA: Insufficient documentation

## 2021-07-23 DIAGNOSIS — Z5111 Encounter for antineoplastic chemotherapy: Secondary | ICD-10-CM | POA: Insufficient documentation

## 2021-07-23 LAB — CBC WITH DIFFERENTIAL (CANCER CENTER ONLY)
Abs Immature Granulocytes: 0.03 10*3/uL (ref 0.00–0.07)
Basophils Absolute: 0 10*3/uL (ref 0.0–0.1)
Basophils Relative: 0 %
Eosinophils Absolute: 0.4 10*3/uL (ref 0.0–0.5)
Eosinophils Relative: 4 %
HCT: 37.6 % — ABNORMAL LOW (ref 39.0–52.0)
Hemoglobin: 12.8 g/dL — ABNORMAL LOW (ref 13.0–17.0)
Immature Granulocytes: 0 %
Lymphocytes Relative: 11 %
Lymphs Abs: 1.1 10*3/uL (ref 0.7–4.0)
MCH: 29.5 pg (ref 26.0–34.0)
MCHC: 34 g/dL (ref 30.0–36.0)
MCV: 86.6 fL (ref 80.0–100.0)
Monocytes Absolute: 1.2 10*3/uL — ABNORMAL HIGH (ref 0.1–1.0)
Monocytes Relative: 11 %
Neutro Abs: 7.9 10*3/uL — ABNORMAL HIGH (ref 1.7–7.7)
Neutrophils Relative %: 74 %
Platelet Count: 414 10*3/uL — ABNORMAL HIGH (ref 150–400)
RBC: 4.34 MIL/uL (ref 4.22–5.81)
RDW: 13.2 % (ref 11.5–15.5)
WBC Count: 10.7 10*3/uL — ABNORMAL HIGH (ref 4.0–10.5)
nRBC: 0 % (ref 0.0–0.2)

## 2021-07-23 LAB — CMP (CANCER CENTER ONLY)
ALT: 135 U/L — ABNORMAL HIGH (ref 0–44)
AST: 51 U/L — ABNORMAL HIGH (ref 15–41)
Albumin: 4.4 g/dL (ref 3.5–5.0)
Alkaline Phosphatase: 185 U/L — ABNORMAL HIGH (ref 38–126)
Anion gap: 9 (ref 5–15)
BUN: 8 mg/dL (ref 6–20)
CO2: 26 mmol/L (ref 22–32)
Calcium: 10 mg/dL (ref 8.9–10.3)
Chloride: 104 mmol/L (ref 98–111)
Creatinine: 0.91 mg/dL (ref 0.61–1.24)
GFR, Estimated: >60 mL/min (ref >60)
Glucose, Bld: 105 mg/dL — ABNORMAL HIGH (ref 70–99)
Potassium: 3.8 mmol/L (ref 3.5–5.1)
Sodium: 139 mmol/L (ref 135–145)
Total Bilirubin: 1.7 mg/dL — ABNORMAL HIGH (ref 0.3–1.2)
Total Protein: 7.9 g/dL (ref 6.5–8.1)

## 2021-07-23 MED ORDER — DEXAMETHASONE 4 MG PO TABS: 8.0000 mg | ORAL_TABLET | Freq: Every day | ORAL | 1 refills | Status: DC

## 2021-07-23 MED ORDER — PROCHLORPERAZINE MALEATE 10 MG PO TABS: 10.0000 mg | ORAL_TABLET | Freq: Four times a day (QID) | ORAL | 1 refills | Status: DC | PRN

## 2021-07-23 MED ORDER — LORAZEPAM 0.5 MG PO TABS: 0.5000 mg | ORAL_TABLET | Freq: Four times a day (QID) | ORAL | 0 refills | Status: DC | PRN

## 2021-07-23 MED ORDER — ONDANSETRON HCL 8 MG PO TABS: 8.0000 mg | ORAL_TABLET | Freq: Two times a day (BID) | ORAL | 1 refills | Status: DC | PRN

## 2021-07-23 MED ORDER — LIDOCAINE-PRILOCAINE 2.5-2.5 % EX CREA: TOPICAL_CREAM | CUTANEOUS | 3 refills | Status: DC

## 2021-07-23 NOTE — Progress Notes (Signed)
START ON PATHWAY REGIMEN - Neuroendocrine     A cycle is every 21 days:     Carboplatin      Etoposide   **Always confirm dose/schedule in your pharmacy ordering system**  Patient Characteristics: Pancreas, Poorly-differentiated, First Line Tumor Location: Pancreas AJCC M Category: M1a AJCC 8 Stage Grouping: IV AJCC T Category: Staged < 8th Ed. AJCC N Category: Staged < 8th Ed. Line of therapy: First Line  Intent of Therapy: Non-Curative / Palliative Intent, Discussed with Patient

## 2021-07-23 NOTE — Progress Notes (Incomplete)
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  HISTORY OF PRESENTING ILLNESS:  Leon Fischer. is a wonderful 31 y.o. 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 He presents today accompanied by his mother. He reports He is doing well.  He reports no color change in stool.  He notes that he is eating well but feels food just sits in stomach.  We discussed getting additional scans and labs for further evaluation and treatment which he was agreeable to. We further discussed getting MRI of brain and PET scan which he was agreeable to.  He notes that he is trying to hold it together mentally and is still trying to process his new diagnosis. We discussed the options of psychological or spiritual counseling which he has not decided on at this time. He was advised that may call back if he decides that he would like to pursue either.  We discussed getting a port a cath installed which he was agreeable to.  He recently had a biliary stent put into common bile duct.   No abdominal pain or change in bowel habits. No new or unexpected weight loss. No other new or acute focal symptoms.  We discussed his recent stomach biopsy done 07/13/2021 which sowed gastropathy and minor chronic gastritis with lymphoid aggregate. Negative for H. Pylori.  We discussed recent cytology done 07/13/2021 which showed presence of malignant cells in pancreas and poorly differentiated/high grade neuroendocrine carcinoma.  We discussed upper endoscopy done 07/13/2021 revealed mass in pancreatic head and some enlarged lymph nodes were seen in  the celiac and peripancreatic regions.  Labs done today were reviewed in detail.  MEDICAL HISTORY:  Past Medical History:  Diagnosis Date   Asthma     SURGICAL HISTORY: Past Surgical History:  Procedure Laterality Date   BILIARY DILATION  07/13/2021   Procedure: BILIARY DILATION;  Surgeon: Clarene Essex, MD;  Location: [facility];  Service: Gastroenterology;;   BILIARY STENT PLACEMENT N/A 07/13/2021   Procedure: BILIARY STENT PLACEMENT;  Surgeon: Clarene Essex, MD;  Location: [name];  Service: Gastroenterology;  Laterality: N/A;   BIOPSY  07/13/2021   Procedure: BIOPSY;  Surgeon: Rush Landmark Telford Nab., MD;  Location: [name];  Service: Gastroenterology;;   ENDOSCOPIC RETROGRADE CHOLANGIOPANCREATOGRAPHY (ERCP) WITH PROPOFOL N/A 07/13/2021   Procedure: ENDOSCOPIC RETROGRADE CHOLANGIOPANCREATOGRAPHY (ERCP) WITH PROPOFOL;  Surgeon: Clarene Essex, MD;  Location: [name];  Service: Gastroenterology;  Laterality: N/A;   ESOPHAGOGASTRODUODENOSCOPY (EGD) WITH PROPOFOL N/A 07/13/2021   Procedure: ESOPHAGOGASTRODUODENOSCOPY (EGD) WITH PROPOFOL;  Surgeon: Rush Landmark Telford Nab., MD;  Location: [name];  Service: Gastroenterology;  Laterality: N/A;   EUS N/A 07/13/2021   Procedure: UPPER ENDOSCOPIC ULTRASOUND (EUS) LINEAR;  Surgeon: Irving Copas., MD;  Location: [name];  Service: Gastroenterology;  Laterality: N/A;   FINE NEEDLE ASPIRATION  07/13/2021   Procedure: FINE NEEDLE ASPIRATION (FNA) LINEAR;  Surgeon: Irving Copas., MD;  Location: [facility];  Service: Gastroenterology;;   Joan Mayans  07/13/2021   Procedure: Joan Mayans;  Surgeon: Clarene Essex, MD;  Location: [name];  Service: Gastroenterology;;    SOCIAL HISTORY: Social History   Socioeconomic History   Marital status: Single    Spouse name: Not on file  Number of children: 0   Years of education: Not on file   Highest education level: Some college, no degree  Occupational History    Not on file  Tobacco Use   Smoking status: Some Days   Smokeless tobacco: Never  Vaping Use   Vaping Use: Never used  Substance and Sexual Activity   Alcohol use: Yes    Comment: social   Drug use: No   Sexual activity: Never  Other Topics Concern   Not on file  Social History Narrative   Not on file   Social Determinants of Health   Financial Resource Strain: Low Risk  (04/14/2017)   Overall Financial Resource Strain (CARDIA)    Difficulty of Paying Living Expenses: Not hard at all  Food Insecurity: No Food Insecurity (04/14/2017)   Hunger Vital Sign    Worried About Running Out of Food in the Last Year: Never true    Ran Out of Food in the Last Year: Never true  Transportation Needs: No Transportation Needs (04/14/2017)   PRAPARE - Hydrologist (Medical): No    Lack of Transportation (Non-Medical): No  Physical Activity: Inactive (04/14/2017)   Exercise Vital Sign    Days of Exercise per Week: 0 days    Minutes of Exercise per Session: 0 min  Stress: Stress Concern Present (04/14/2017)   [place]    Feeling of Stress : Rather much  Social Connections: Moderately Isolated (04/14/2017)   Social Connection and Isolation Panel [NHANES]    Frequency of Communication with Friends and Family: More than three times a week    Frequency of Social Gatherings with Friends and Family: More than three times a week    Attends Religious Services: Never    Marine scientist or Organizations: No    Attends Archivist Meetings: Never    Marital Status: Never married  Intimate Partner Violence: Not At Risk (04/14/2017)   Humiliation, Afraid, Rape, and Kick questionnaire    Fear of Current or Ex-Partner: No    Emotionally Abused: No    Physically Abused: No    Sexually Abused: No    FAMILY HISTORY: Family History  Problem Relation Age of Onset   Drug abuse Father    ALLERGIES:   is allergic to shellfish allergy and other.  MEDICATIONS:  Current Outpatient Medications  Medication Sig Dispense Refill   albuterol (VENTOLIN HFA) 108 (90 Base) MCG/ACT inhaler Inhale 2 puffs into the lungs every 4 (four) hours as needed for wheezing or shortness of breath. 18 g 0   oxyCODONE (OXY IR/ROXICODONE) 5 MG immediate release tablet Take 1 tablet (5 mg total) by mouth every 6 (six) hours as needed for moderate pain or breakthrough pain. 12 tablet 0   pantoprazole (PROTONIX) 40 MG tablet Take 1 tablet (40 mg total) by mouth daily. 30 tablet 1   polyethylene glycol (MIRALAX / GLYCOLAX) 17 g packet Take 17 g by mouth daily. 14 each 0   senna-docusate (SENOKOT-S) 8.6-50 MG tablet Take 2 tablets by mouth 2 (two) times daily. 30 tablet 0   No current facility-administered medications for this visit.    REVIEW OF SYSTEMS:    10 Point review of Systems was done is negative except as noted above.  PHYSICAL EXAMINATION: ECOG PERFORMANCE STATUS: {CHL ONC ECOG BJ:[id number]}  .There were no vitals filed for this visit. .There is no height or weight on file to calculate  BMI. NAD GENERAL:alert, in no acute distress and comfortable SKIN: no acute rashes, no significant lesions EYES: conjunctiva are pink and non-injected, sclera anicteric NECK: supple, no JVD LYMPH:  no palpable lymphadenopathy in the cervical, axillary or inguinal regions LUNGS: clear to auscultation b/l with normal respiratory effort HEART: regular rate & rhythm ABDOMEN:  normoactive bowel sounds , non tender, not distended. Extremity: no pedal edema PSYCH: alert & oriented x 3 with fluent speech NEURO: no focal motor/sensory deficits  LABORATORY DATA:  I have reviewed the data as listed  .    Latest Ref Rng & Units 07/23/2021    7:53 AM 07/12/2021    5:43 AM 07/11/2021    9:30 AM  CBC  WBC 4.0 - 10.5 K/uL 10.7  7.3  10.5   Hemoglobin 13.0 - 17.0 g/dL 12.8  13.0  14.6   Hematocrit 39.0 - 52.0 % 37.6  40.2   45.6   Platelets 150 - 400 K/uL 414  281  297     .    Latest Ref Rng & Units 07/15/2021    6:18 AM 07/14/2021    6:05 AM 07/13/2021   11:35 AM  CMP  Glucose 70 - 99 mg/dL 94  99  146   BUN 6 - 20 mg/dL '7  7  10   '$ Creatinine 0.61 - 1.24 mg/dL 0.73  0.80  0.89   Sodium 135 - 145 mmol/L 142  142  139   Potassium 3.5 - 5.1 mmol/L 3.6  3.8  4.1   Chloride 98 - 111 mmol/L 109  109  105   CO2 22 - 32 mmol/L '25  25  23   '$ Calcium 8.9 - 10.3 mg/dL 9.0  9.3  10.0   Total Protein 6.5 - 8.1 g/dL 7.1  7.1  7.9   Total Bilirubin 0.3 - 1.2 mg/dL 3.1  3.9  8.0   Alkaline Phos 38 - 126 U/L 260  314  366   AST 15 - 41 U/L 170  191  293   ALT 0 - 44 U/L 601  659  613    Cytology done 07/13/2021 revealed "FINAL MICROSCOPIC DIAGNOSIS:  A. PANCREAS, HEAD, FINE NEEDLE ASPIRATION:  - Malignant cells present  - Poorly differentiated/high-grade neuroendocrine carcinoma (see  comment)   B. CBD STRICTURE, BRUSHING:  - Atypical cells suspicious for tumor   C. BILIARY DILATION, BALLOON, REMOVAL:  - Atypical cells suspicious for tumor "  Surgical pathology done 07/13/2021 revealed "FINAL MICROSCOPIC DIAGNOSIS:   A. STOMACH, BIOPSY:  Reactive gastropathy and minimal chronic gastritis with lymphoid  aggregate  Negative for H. pylori, intestinal metaplasia, dysplasia and carcinoma"  RADIOGRAPHIC STUDIES: I have personally reviewed the radiological images as listed and agreed with the findings in the report. DG ERCP  Result Date: 07/13/2021 CLINICAL DATA:  Pancreatic mass, jaundice and abnormal liver function tests. EXAM: ERCP TECHNIQUE: Multiple spot images obtained with the fluoroscopic device and submitted for interpretation post-procedure. COMPARISON:  CT of the abdomen on 07/11/2021 FINDINGS: Imaging during ERCP demonstrates evidence probable stricture of the distal common bile duct at the level of a known mass involving the pancreatic head. Balloon dilatation was performed followed by placement of an  endoscopic biliary stent in the common bile duct. Contrast injection demonstrates mild prominence in caliber of the common bile duct above the level of stricture. IMPRESSION: Distal common bile duct stricture treated by balloon dilatation and placement of an endoscopic biliary stent. These images were submitted for radiologic interpretation  only. Please see the procedural report for the amount of contrast and the fluoroscopy time utilized. Electronically Signed   By: Aletta Edouard M.D.   On: 07/13/2021 11:15   DG C-Arm 1-60 Min-No Report  Result Date: 07/13/2021 Fluoroscopy was utilized by the requesting physician.  No radiographic interpretation.   CT ABDOMEN PELVIS W CONTRAST  Result Date: 07/11/2021 CLINICAL DATA:  Bowel obstruction suspected. EXAM: CT ABDOMEN AND PELVIS WITH CONTRAST TECHNIQUE: Multidetector CT imaging of the abdomen and pelvis was performed using the standard protocol following bolus administration of intravenous contrast. RADIATION DOSE REDUCTION: This exam was performed according to the departmental dose-optimization program which includes automated exposure control, adjustment of the mA and/or kV according to patient size and/or use of iterative reconstruction technique. CONTRAST:  181m OMNIPAQUE IOHEXOL 300 MG/ML  SOLN COMPARISON:  CT and MRI Jul 02, 2021 FINDINGS: Lower chest: No acute abnormality. Hepatobiliary: No significant interval change in the subtle hypodense hepatic lesions measuring up to 1.3 cm in the right lobe of the liver on image 21/2. Gallbladder is unremarkable. Similar mild prominence of the biliary tree without dilation. Pancreas: No significant interval change in the ill-defined hypoenhancing masslike area along the posterior pancreatic head which measures approximally 2.7 cm on image 32/2. Similar mild pancreatic ductal dilation without significant pancreatic atrophy. Spleen: No splenomegaly or focal splenic lesion. Adrenals/Urinary Tract: Bilateral adrenal  glands appear normal. No hydronephrosis. Kidneys demonstrate symmetric enhancement. No suspicious renal mass. Stomach/Bowel: Stomach is unremarkable for degree of distension. No pathologic dilation of small or large bowel. Terminal ileum appears normal. Normal appendix. Gas fluid levels throughout the colon suggestive of diarrheal illness. Vascular/Lymphatic: Normal caliber abdominal aorta. Enlarged retroperitoneal/upper abdominal lymph nodes are again seen an unchanged for instance an aortocaval lymph node measuring 15 mm in short axis on image 34/2. Reproductive: Prostate is unremarkable. Other: No significant abdominopelvic free fluid. Musculoskeletal: No aggressive lytic or blastic lesion of bone. IMPRESSION: 1. Gas fluid levels throughout the colon suggestive of diarrheal illness. No evidence of bowel obstruction. 2. No significant interval change in the hypodense/hypoenhancing masslike area in the pancreatic head with associated ductal dilation. 3. Stable enlarged upper abdominal lymph nodes and subtle hypodense hepatic lesions. Electronically Signed   By: JDahlia BailiffM.D.   On: 07/11/2021 13:03   CT CHEST W CONTRAST  Result Date: 07/03/2021 CLINICAL DATA:  Pancreatic carcinoma staging EXAM: CT CHEST WITH CONTRAST TECHNIQUE: Multidetector CT imaging of the chest was performed during intravenous contrast administration. RADIATION DOSE REDUCTION: This exam was performed according to the departmental dose-optimization program which includes automated exposure control, adjustment of the mA and/or kV according to patient size and/or use of iterative reconstruction technique. CONTRAST:  737mOMNIPAQUE IOHEXOL 300 MG/ML  SOLN COMPARISON:  None Available. FINDINGS: Cardiovascular: There is homogeneous enhancement in thoracic aorta. There are no filling defects in the central pulmonary artery branches. Mediastinum/Nodes: No significant lymphadenopathy is seen. Lungs/Pleura: There is no focal pulmonary  consolidation. There are no discrete lung nodules. Small linear densities in the right posterior costophrenic angle may suggest minimal scarring or subsegmental atelectasis. There is no pleural effusion or pneumothorax. Upper Abdomen: There is 2.6 cm ill-defined low-attenuation lesion at the junction of head and body of pancreas. There is dilation of pancreatic duct in the body and tail. There is no dilation of bile ducts. Gallbladder is distended. There is no wall thickening in gallbladder. Small space-occupying lesions seen in the liver in the previous MRI could not be distinctly identified in the  CT images. Musculoskeletal: There are no focal lytic or sclerotic lesions. Degenerative changes are noted in the thoracic spine. Gynecomastia is seen in both breasts. IMPRESSION: No significant abnormality is seen in the CT scan of chest. There is no significant lymphadenopathy. There are no discrete lung nodules. Low-density lesion seen in the pancreas is better evaluated in the MRI examination. Electronically Signed   By: Elmer Picker M.D.   On: 07/03/2021 16:08   MR ABDOMEN MRCP W WO CONTAST  Result Date: 07/02/2021 CLINICAL DATA:  Abdominal pain, nausea/vomiting, possible pancreatic mass on CT EXAM: MRI ABDOMEN WITHOUT AND WITH CONTRAST (INCLUDING MRCP) TECHNIQUE: Multiplanar multisequence MR imaging of the abdomen was performed both before and after the administration of intravenous contrast. Heavily T2-weighted images of the biliary and pancreatic ducts were obtained, and three-dimensional MRCP images were rendered by post processing. CONTRAST:  17m GADAVIST GADOBUTROL 1 MMOL/ML IV SOLN COMPARISON:  CT abdomen/pelvis dated 07/02/2021 FINDINGS: Motion degraded images. Lower chest: Lung bases are clear. Hepatobiliary: Two subtle liver lesions, measuring 11 mm in segment 8 (series 4/image 9) and 12 mm in segment 5 along the gallbladder fossa (series 4/image 21). These are poorly evaluated due to motion  degradation, but enhancement is favored at least within the segment 5 lesion on postcontrast subtraction imaging (series 31/image 62). Associated restricted diffusion. Three additional smaller lesions are present on diffusion imaging (series 8/images 55 and 63). These findings are concerning for metastatic disease. Gallbladder is unremarkable. No cholelithiasis or inflammatory changes. No intrahepatic or extrahepatic ductal dilatation. No choledocholithiasis is evident. Pancreas: 3.1 x 2.3 cm hypoenhancing mass along the posteromedial aspect of the pancreatic head (series 22/image 67), suspicious for pancreatic neoplasm. Associated mild dilatation of the main pancreatic duct (series 4/image 20), which abruptly ends at the level of the suspected mass (series 4/image 23). No pancreatic atrophy. Spleen:  Within normal limits. Adrenals/Urinary Tract:  Adrenal glands are within normal limits. Kidneys are within normal limits.  No hydronephrosis. Stomach/Bowel: Stomach is within normal limits. Visualized bowel is unremarkable. Vascular/Lymphatic:  No evidence of abdominal aortic aneurysm. Celiac artery, SMA, and portal vein appear distinct from the mass, without definite vascular invasion. Upper abdominal lymphadenopathy, including a 16 mm short axis portacaval node (series 4/image 21) and a 17 mm short axis right retrocaval node at the level of the right renal vein. Other:  No abdominal ascites. Musculoskeletal: No focal osseous lesions. IMPRESSION: 3.1 x 2.3 cm hypoenhancing mass along the posteromedial aspect of the pancreatic head, suspicious for pancreatic neoplasm. EUS is suggested. Multiple small liver lesions, suspicious for hepatic metastases. Upper abdominal/retroperitoneal lymphadenopathy, suspicious for nodal metastases. Electronically Signed   By: SJulian HyM.D.   On: 07/02/2021 20:16   MR 3D Recon At Scanner  Result Date: 07/02/2021 CLINICAL DATA:  Abdominal pain, nausea/vomiting, possible  pancreatic mass on CT EXAM: MRI ABDOMEN WITHOUT AND WITH CONTRAST (INCLUDING MRCP) TECHNIQUE: Multiplanar multisequence MR imaging of the abdomen was performed both before and after the administration of intravenous contrast. Heavily T2-weighted images of the biliary and pancreatic ducts were obtained, and three-dimensional MRCP images were rendered by post processing. CONTRAST:  165mGADAVIST GADOBUTROL 1 MMOL/ML IV SOLN COMPARISON:  CT abdomen/pelvis dated 07/02/2021 FINDINGS: Motion degraded images. Lower chest: Lung bases are clear. Hepatobiliary: Two subtle liver lesions, measuring 11 mm in segment 8 (series 4/image 9) and 12 mm in segment 5 along the gallbladder fossa (series 4/image 21). These are poorly evaluated due to motion degradation, but enhancement is favored at least  within the segment 5 lesion on postcontrast subtraction imaging (series 31/image 62). Associated restricted diffusion. Three additional smaller lesions are present on diffusion imaging (series 8/images 55 and 63). These findings are concerning for metastatic disease. Gallbladder is unremarkable. No cholelithiasis or inflammatory changes. No intrahepatic or extrahepatic ductal dilatation. No choledocholithiasis is evident. Pancreas: 3.1 x 2.3 cm hypoenhancing mass along the posteromedial aspect of the pancreatic head (series 22/image 67), suspicious for pancreatic neoplasm. Associated mild dilatation of the main pancreatic duct (series 4/image 20), which abruptly ends at the level of the suspected mass (series 4/image 23). No pancreatic atrophy. Spleen:  Within normal limits. Adrenals/Urinary Tract:  Adrenal glands are within normal limits. Kidneys are within normal limits.  No hydronephrosis. Stomach/Bowel: Stomach is within normal limits. Visualized bowel is unremarkable. Vascular/Lymphatic:  No evidence of abdominal aortic aneurysm. Celiac artery, SMA, and portal vein appear distinct from the mass, without definite vascular invasion.  Upper abdominal lymphadenopathy, including a 16 mm short axis portacaval node (series 4/image 21) and a 17 mm short axis right retrocaval node at the level of the right renal vein. Other:  No abdominal ascites. Musculoskeletal: No focal osseous lesions. IMPRESSION: 3.1 x 2.3 cm hypoenhancing mass along the posteromedial aspect of the pancreatic head, suspicious for pancreatic neoplasm. EUS is suggested. Multiple small liver lesions, suspicious for hepatic metastases. Upper abdominal/retroperitoneal lymphadenopathy, suspicious for nodal metastases. Electronically Signed   By: Julian Hy M.D.   On: 07/02/2021 20:16   CT ABDOMEN PELVIS W CONTRAST  Result Date: 07/02/2021 CLINICAL DATA:  Abdominal pain radiating to the back for 2 weeks, worse over the last 2 days. Some nausea vomiting. EXAM: CT ABDOMEN AND PELVIS WITH CONTRAST TECHNIQUE: Multidetector CT imaging of the abdomen and pelvis was performed using the standard protocol following bolus administration of intravenous contrast. RADIATION DOSE REDUCTION: This exam was performed according to the departmental dose-optimization program which includes automated exposure control, adjustment of the mA and/or kV according to patient size and/or use of iterative reconstruction technique. CONTRAST:  118m OMNIPAQUE IOHEXOL 300 MG/ML  SOLN COMPARISON:  None Available. FINDINGS: Lower chest: Clear lung bases. Hepatobiliary: Liver normal in size. There are several subtle hypoattenuating liver lesions, not well-defined, most apparent at the lateral dome of segment 7, proximally 1.1 cm in size. No other liver abnormality. Gallbladder is distended, but without wall thickening or pericholecystic fluid. Common bile duct is mildly dilated, 8 mm proximally. Pancreas: There is a dystrophic appearing calcification in the upper pancreatic head surrounded by vague area of relative hypoattenuation, which may reflect a mass. The possible mass measures 2.6 cm. No other evidence  of a pancreatic mass. The pancreatic duct distal to the possible mass is dilated to 4 mm. No pancreatic inflammation. Spleen: Normal in size without focal abnormality. Adrenals/Urinary Tract: Adrenal glands are unremarkable. Kidneys are normal, without renal calculi, focal lesion, or hydronephrosis. Bladder is unremarkable. Stomach/Bowel: Stomach is within normal limits. Appendix appears normal. No evidence of bowel wall thickening, distention, or inflammatory changes. Vascular/Lymphatic: Mildly enlarged retroperitoneal lymph nodes at the level of the upper pole the right kidney, posterior to the inferior vena cava, 1.5 x 1.4 cm in short axis ease. Mildly prominent, but subcentimeter peripancreatic and celiac lymph nodes. No other adenopathy. No vascular abnormality. Portal vein, splenic vein and superior mesenteric vein are widely patent. Reproductive: Unremarkable. Other: No abdominal wall hernia or abnormality. No abdominopelvic ascites. Musculoskeletal: No fracture or acute finding.  No bone lesion. IMPRESSION: 1. Possible 2.6 cm pancreatic mass suspicious  for neoplasm, associated with mild dilation of the pancreatic duct distal to the apparent mass, mild dilation of the common bile duct and 2 adjacent enlarged retroperitoneal lymph nodes. There are also subtle low-attenuation lesions in the liver that are not well-defined, possibly metastatic disease. Recommend follow-up pancreatic MRI without and with contrast for further assessment. 2. No other abnormalities.  No acute findings. Electronically Signed   By: Lajean Manes M.D.   On: 07/02/2021 13:50     ASSESSMENT & PLAN:   31 y.o. very pleasant male with  1. Poorly differentiated/high grade neuroendocrine carcinoma -Cytology done 07/13/2021 shows presence of malignant cells in pancreas and poorly differentiated/high grade neuroendocrine carcinoma.  Plan -I discussed available labs with the patient in details Labs done today were reviewed in  detail. CBC shows Hgb of 12.8, WBC count of 10.7k, and platelet count of 414k. CMP shows elevated counts as follows: AST of 51, ALT of 135, ALP of 185, and total bilirubin of 1.7. -Patient's chart reviewed in detail -We discussed recent cytology done 07/13/2021 which revealed presence of malignant cells in pancreas and poorly differentiated/high grade neuroendocrine carcinoma. -We discussed his recent stomach biopsy done 07/13/2021 which sowed gastropathy and minor chronic gastritis with lymphoid aggregate. Negative for H. Pylori. -We discussed upper endoscopy done 07/13/2021 revealed mass in pancreatic head and some enlarged lymph nodes were seen in the celiac and peripancreatic regions. -Refill Oxycodone. -We discussed the options of psychological or spiritual counseling which he has not decided on at this time. He was advised that may call back if he decides that he would like to pursue either. 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 -Start carboplatin etoposide chemotherapy in 5-7 days  -Schedule chemo counseling  -PET CT dotatate study and MRI brain in 3-5 days. -RTC C1D3 for early toxicity check.  Follow up: PET CT dotatate study in 3 to 5 days MRI brain in 3 to 5 days Chemo counseling for carboplatin etoposide chemotherapy IR for Port-A-Cath placement Please schedule to start carboplatin etoposide chemotherapy in 5 to 7 days with labs on day 1 MD visit cycle 1 day 3 for early toxicity check.  All of the patients questions were answered with apparent satisfaction. The patient knows to call the clinic with any problems, questions or concerns.  I spent {CHL ONC TIME VISIT - PHXTA:[id number]} counseling the patient face to face. The total time spent in the appointment was {CHL ONC TIME VISIT - PVVZS:[id number]} and more than 50% was on  counseling and direct patient cares.    Sullivan Lone MD [place] AAHIVMS [facility] Hematology/Oncology Physician [facility]  (Office):       [phone] (Work cell):  [phone] (Fax):           [phone]  I, Melene Muller, am acting as scribe for Fabienne Bruns, MD.

## 2021-07-23 NOTE — Progress Notes (Unsigned)
I met with Mr Leon Fischer and his mother, Azarion Hove after  his consultation with Dr Irene Limbo.  I explained my role as a nurse navigator and provided my contact information. I explained the services provided at [facility] and provided written information.  I explained the alight grant and let  them know one of the financial advisors will reach out to  him at the time of his chemo education class. I briefly explained insertion and care of a port a cath.  I showed a sample of the port a cath.  I briefly reviewed  recommended chemotherapy regimen,  carboplatin/etoposide.  I told them he will be scheduled for chemotherapy education class prior to receiving chemotherapy.   Ms Schweitzer requested that if we have "bad" news to give him to make sure someone is on the phone with him for emotional support. All questions were answered.  They verbalized understanding.

## 2021-07-23 NOTE — Progress Notes (Signed)
The following biosimilar Udenyca ([id number]) has been selected for use in this patient. Insurance prefers Neulasta. Pharmacy switched per PA team.

## 2021-07-24 ENCOUNTER — Encounter: Payer: Self-pay | Admitting: Hematology

## 2021-07-24 LAB — C-PEPTIDE: C-Peptide: 4 ng/mL (ref 1.1–4.4)

## 2021-07-24 LAB — CHROMOGRANIN A: Chromogranin A (ng/mL): 33.8 ng/mL (ref 0.0–101.8)

## 2021-07-24 NOTE — Progress Notes (Signed)
I left vm for Leon Fischer regarding port placement and MRI appts.  I requested he call me back to go over this information.  I provided my direct contact information. I spoke with Keeon and his mother.  I reviewed MRI and port placement appt dates, arrival times, locations, and instructions.   I told them we are still waiting for insurance preauth for PET scan. They verbalized understanding.

## 2021-07-25 ENCOUNTER — Telehealth: Payer: Self-pay | Admitting: Hematology

## 2021-07-25 LAB — GASTRIN: Gastrin: 31 pg/mL (ref 0–115)

## 2021-07-25 LAB — MISC LABCORP TEST (SEND OUT): Labcorp test code: 4622

## 2021-07-25 NOTE — Telephone Encounter (Signed)
Scheduled follow-up appointments per 6/19 los. Patient is aware. 

## 2021-07-26 NOTE — Progress Notes (Signed)
Pharmacist Chemotherapy Monitoring - Initial Assessment    Anticipated start date: 08/01/21   The following has been reviewed per standard work regarding the patient's treatment regimen: The patient's diagnosis, treatment plan and drug doses, and organ/hematologic function Lab orders and baseline tests specific to treatment regimen  The treatment plan start date, drug sequencing, and pre-medications Prior authorization status  Patient's documented medication list, including drug-drug interaction screen and prescriptions for anti-emetics and supportive care specific to the treatment regimen The drug concentrations, fluid compatibility, administration routes, and timing of the medications to be used The patient's access for treatment and lifetime cumulative dose history, if applicable  The patient's medication allergies and previous infusion related reactions, if applicable   Changes made to treatment plan:  treatment plan date  Follow up needed:  Pending authorization for treatment  and dose adjustment of Etoposide due to elevated Tbili - will f/u recent results   Kennith Center, Pharm.D., CPP 07/26/2021'@2'$ :38 PM

## 2021-07-29 ENCOUNTER — Ambulatory Visit (HOSPITAL_COMMUNITY)
Admission: RE | Admit: 2021-07-29 | Discharge: 2021-07-29 | Disposition: A | Discharge status: Home / Self Care | Payer: 59 | Source: Ambulatory Visit | Attending: Hematology | Admitting: Hematology

## 2021-07-29 DIAGNOSIS — C7A1 Malignant poorly differentiated neuroendocrine tumors: Secondary | ICD-10-CM | POA: Diagnosis present

## 2021-07-29 MED ORDER — GADOBUTROL 1 MMOL/ML IV SOLN
10.0000 mL | Freq: Once | INTRAVENOUS | Status: AC | PRN
  Administered 2021-07-29: 10 mL via INTRAVENOUS

## 2021-07-29 MED ORDER — GADOBUTROL 1 MMOL/ML IV SOLN: 10.0000 mL | Freq: Once | INTRAVENOUS | Status: DC | PRN

## 2021-07-30 ENCOUNTER — Inpatient Hospital Stay: Payer: 59

## 2021-07-30 ENCOUNTER — Encounter: Payer: Self-pay | Admitting: Hematology

## 2021-07-30 ENCOUNTER — Other Ambulatory Visit: Payer: Self-pay

## 2021-07-30 ENCOUNTER — Other Ambulatory Visit: Payer: Self-pay | Admitting: Internal Medicine

## 2021-07-30 DIAGNOSIS — K8689 Other specified diseases of pancreas: Secondary | ICD-10-CM

## 2021-07-31 ENCOUNTER — Ambulatory Visit (HOSPITAL_COMMUNITY)
Admission: RE | Admit: 2021-07-31 | Discharge: 2021-07-31 | Disposition: A | Discharge status: Home / Self Care | Payer: 59 | Source: Ambulatory Visit | Attending: Hematology | Admitting: Hematology

## 2021-07-31 ENCOUNTER — Telehealth: Payer: Self-pay

## 2021-07-31 ENCOUNTER — Encounter (HOSPITAL_COMMUNITY): Payer: Self-pay

## 2021-07-31 ENCOUNTER — Other Ambulatory Visit: Payer: Self-pay | Admitting: *Deleted

## 2021-07-31 DIAGNOSIS — K8689 Other specified diseases of pancreas: Secondary | ICD-10-CM

## 2021-07-31 DIAGNOSIS — C7A1 Malignant poorly differentiated neuroendocrine tumors: Secondary | ICD-10-CM

## 2021-07-31 DIAGNOSIS — C259 Malignant neoplasm of pancreas, unspecified: Secondary | ICD-10-CM | POA: Insufficient documentation

## 2021-07-31 DIAGNOSIS — Z7189 Other specified counseling: Secondary | ICD-10-CM

## 2021-07-31 HISTORY — PX: IR IMAGING GUIDED PORT INSERTION: IMG5740

## 2021-07-31 MED ORDER — FENTANYL CITRATE (PF) 100 MCG/2ML IJ SOLN
INTRAMUSCULAR | Status: AC
  Filled 2021-07-31: qty 4

## 2021-07-31 MED ORDER — HEPARIN SOD (PORK) LOCK FLUSH 100 UNIT/ML IV SOLN
INTRAVENOUS | Status: AC
  Filled 2021-07-31: qty 5

## 2021-07-31 MED ORDER — MIDAZOLAM HCL 2 MG/2ML IJ SOLN
INTRAMUSCULAR | Status: AC
  Filled 2021-07-31: qty 4

## 2021-07-31 MED ORDER — FENTANYL CITRATE (PF) 100 MCG/2ML IJ SOLN
INTRAMUSCULAR | Status: AC | PRN
  Administered 2021-07-31 (×2): 25 ug via INTRAVENOUS
  Administered 2021-07-31 (×2): 50 ug via INTRAVENOUS

## 2021-07-31 MED ORDER — SODIUM CHLORIDE 0.9 % IV SOLN: INTRAVENOUS | Status: DC

## 2021-07-31 MED ORDER — LIDOCAINE-EPINEPHRINE 1 %-1:100000 IJ SOLN
INTRAMUSCULAR | Status: AC
  Filled 2021-07-31: qty 1

## 2021-07-31 MED ORDER — MIDAZOLAM HCL 2 MG/2ML IJ SOLN
INTRAMUSCULAR | Status: AC | PRN
  Administered 2021-07-31: .5 mg via INTRAVENOUS
  Administered 2021-07-31: 1 mg via INTRAVENOUS
  Administered 2021-07-31: .5 mg via INTRAVENOUS
  Administered 2021-07-31: 1 mg via INTRAVENOUS

## 2021-07-31 NOTE — H&P (Signed)
Chief Complaint: Patient was seen in consultation today for neuroendocrine tumor at the request of [name]  Referring Physician(s): [name]  Supervising Physician: Malachy Moan  Patient Status: [facility] - Out-pt  History of Present Illness: Leon Fischer. is a 31 y.o. male with newly diagnosed poorly differentiated/high grade neuroendocrine carcinoma.  He is scheduled to start chemotherapy treatment tomorrow.   He presents today for Port-a-cath placement.    Endorses feeling well today.   Past Medical History:  Diagnosis Date   Asthma     Past Surgical History:  Procedure Laterality Date   BILIARY DILATION  07/13/2021   Procedure: BILIARY DILATION;  Surgeon: Vida Rigger, MD;  Location: [facility];  Service: Gastroenterology;;   BILIARY STENT PLACEMENT N/A 07/13/2021   Procedure: BILIARY STENT PLACEMENT;  Surgeon: Vida Rigger, MD;  Location: [name];  Service: Gastroenterology;  Laterality: N/A;   BIOPSY  07/13/2021   Procedure: BIOPSY;  Surgeon: Meridee Score Netty Starring., MD;  Location: [name];  Service: Gastroenterology;;   ENDOSCOPIC RETROGRADE CHOLANGIOPANCREATOGRAPHY (ERCP) WITH PROPOFOL N/A 07/13/2021   Procedure: ENDOSCOPIC RETROGRADE CHOLANGIOPANCREATOGRAPHY (ERCP) WITH PROPOFOL;  Surgeon: Vida Rigger, MD;  Location: [name];  Service: Gastroenterology;  Laterality: N/A;   ESOPHAGOGASTRODUODENOSCOPY (EGD) WITH PROPOFOL N/A 07/13/2021   Procedure: ESOPHAGOGASTRODUODENOSCOPY (EGD) WITH PROPOFOL;  Surgeon: Meridee Score Netty Starring., MD;  Location: [name];  Service: Gastroenterology;  Laterality: N/A;   EUS N/A 07/13/2021   Procedure: UPPER ENDOSCOPIC ULTRASOUND (EUS) LINEAR;  Surgeon: Lemar Lofty., MD;  Location: [name];  Service: Gastroenterology;  Laterality: N/A;   FINE NEEDLE ASPIRATION  07/13/2021   Procedure: FINE NEEDLE ASPIRATION (FNA) LINEAR;  Surgeon: Lemar Lofty., MD;  Location: [facility];  Service: Gastroenterology;;   Dennison Mascot  07/13/2021   Procedure: Dennison Mascot;  Surgeon: Vida Rigger, MD;  Location: [name];  Service: Gastroenterology;;    Allergies: Shellfish allergy and Other  Medications: Prior to Admission medications   Medication Sig Start Date End Date Taking? Authorizing Provider  albuterol (VENTOLIN HFA) 108 (90 Base) MCG/ACT inhaler Inhale 2 puffs into the lungs every 4 (four) hours as needed for wheezing or shortness of breath. 07/05/21  Yes Regalado, Belkys A, MD  oxyCODONE (OXY IR/ROXICODONE) 5 MG immediate release tablet Take 1 tablet (5 mg total) by mouth every 6 (six) hours as needed for moderate pain or breakthrough pain. 07/05/21  Yes Regalado, Belkys A, MD  pantoprazole (PROTONIX) 40 MG tablet Take 1 tablet (40 mg total) by mouth daily. 07/05/21 07/05/22 Yes Regalado, Belkys A, MD  polyethylene glycol (MIRALAX / GLYCOLAX) 17 g packet Take 17 g by mouth daily. 07/05/21  Yes Regalado, Belkys A, MD  senna-docusate (SENOKOT-S) 8.6-50 MG tablet Take 2 tablets by mouth 2 (two) times daily. 07/05/21  Yes Regalado, Belkys A, MD  dexamethasone (DECADRON) 4 MG tablet Take 2 tablets (8 mg total) by mouth daily. Start the day after chemotherapy for 1 day. 07/23/21   [name], MD  lidocaine-prilocaine (EMLA) cream Apply to affected area once 07/23/21   [name], MD  LORazepam (ATIVAN) 0.5 MG tablet Take 1 tablet (0.5 mg total) by mouth every 6 (six) hours as needed (Nausea or vomiting). 07/23/21   [name], MD  ondansetron (ZOFRAN) 8 MG tablet Take 1 tablet (8 mg total) by mouth 2 (two) times daily as needed for refractory nausea / vomiting. Start on day 3 after carboplatin chemo. 07/23/21   [name], MD  prochlorperazine (COMPAZINE) 10 MG tablet Take 1 tablet (10 mg total) by mouth every 6 (six) hours as needed (Nausea or vomiting). 07/23/21   [name], MD     Family History  Problem Relation Age of  Onset   Drug abuse Father     Social History   Socioeconomic History   Marital status: Single    Spouse name: Not on file   Number of children: 0   Years of education: Not on file   Highest education level: Some college, no degree  Occupational History   Not on file  Tobacco Use   Smoking status: Some Days   Smokeless tobacco: Never  Vaping Use   Vaping Use: Never used  Substance and Sexual Activity   Alcohol use: Yes    Comment: social   Drug use: No   Sexual activity: Never  Other Topics Concern   Not on file  Social History Narrative   Not on file   Social Determinants of Health   Financial Resource Strain: Low Risk  (04/14/2017)   Overall Financial Resource Strain (CARDIA)    Difficulty of Paying Living Expenses: Not hard at all  Food Insecurity: No Food Insecurity (04/14/2017)   Hunger Vital Sign    Worried About Running Out of Food in the Last Year: Never true    Ran Out of Food in the Last Year: Never true  Transportation Needs: No Transportation Needs (04/14/2017)   PRAPARE - Administrator, Civil Service (Medical): No    Lack of Transportation (Non-Medical): No  Physical Activity: Inactive (04/14/2017)   Exercise Vital Sign    Days of Exercise per Week: 0 days    Minutes of Exercise per Session: 0 min  Stress: Stress Concern Present (04/14/2017)   [name] of Occupational Health - Occupational Stress Questionnaire    Feeling of Stress : Rather much  Social Connections: Moderately Isolated (04/14/2017)   Social Connection and Isolation Panel [NHANES]    Frequency of Communication with Friends and Family: More than three times a week    Frequency of Social Gatherings with Friends and Family: More than three times a week    Attends Religious Services: Never    Database administrator or Organizations: No    Attends Banker Meetings: Never    Marital Status: Never married    Review of Systems  Constitutional: Negative.    HENT: Negative.    Eyes: Negative.   Respiratory: Negative.    Cardiovascular: Negative.   Gastrointestinal: Negative.   Endocrine: Negative.   Genitourinary: Negative.   Musculoskeletal: Negative.   Skin: Negative.   Allergic/Immunologic: Negative.   Neurological: Negative.   Hematological: Negative.   Psychiatric/Behavioral: Negative.      Vital Signs: BP (!) 144/93   Pulse (!) 12   Temp 98.3 F (36.8 C) (Oral)   Resp 12   SpO2 98%    Physical Exam Vitals reviewed.  Constitutional:      General: He is not in acute distress.    Appearance: Normal appearance. He is not ill-appearing.  HENT:     Head: Normocephalic and atraumatic.     Mouth/Throat:     Mouth: Mucous membranes are moist.     Pharynx: Oropharynx is clear.  Eyes:     Extraocular Movements: Extraocular movements intact.     Conjunctiva/sclera: Conjunctivae normal.  Cardiovascular:     Rate and Rhythm: Normal rate and regular rhythm.  Pulmonary:  Effort: Pulmonary effort is normal.     Breath sounds: Normal breath sounds.  Abdominal:     General: Bowel sounds are normal. There is no distension.     Palpations: Abdomen is soft.  Skin:    General: Skin is warm and dry.     Capillary Refill: Capillary refill takes less than 2 seconds.  Neurological:     General: No focal deficit present.     Mental Status: He is alert and oriented to person, place, and time.  Psychiatric:        Mood and Affect: Mood normal.        Behavior: Behavior normal.     Imaging: MR Brain W Wo Contrast  Result Date: 07/30/2021 CLINICAL DATA:  Initial staging for metastatic poorly differentiated neuroendocrine tumor EXAM: MRI HEAD WITHOUT AND WITH CONTRAST TECHNIQUE: Multiplanar, multiecho pulse sequences of the brain and surrounding structures were obtained without and with intravenous contrast. CONTRAST:  10mL GADAVIST GADOBUTROL 1 MMOL/ML IV SOLN COMPARISON:  CT head 01/14/2019. FINDINGS: Brain: No acute infarction,  hemorrhage, hydrocephalus, extra-axial collection or mass lesion. No pathologic enhancement. Vascular: Major arterial flow voids are maintained at the skull base. Skull and upper cervical spine: Normal marrow signal. Sinuses/Orbits: Mild scattered ethmoid air cell opacification. No acute orbital findings. Other: No mastoid effusions. IMPRESSION: No evidence of acute intracranial abnormality or metastatic disease. Electronically Signed   By: Feliberto Harts M.D.   On: 07/30/2021 10:42   DG ERCP  Result Date: 07/13/2021 CLINICAL DATA:  Pancreatic mass, jaundice and abnormal liver function tests. EXAM: ERCP TECHNIQUE: Multiple spot images obtained with the fluoroscopic device and submitted for interpretation post-procedure. COMPARISON:  CT of the abdomen on 07/11/2021 FINDINGS: Imaging during ERCP demonstrates evidence probable stricture of the distal common bile duct at the level of a known mass involving the pancreatic head. Balloon dilatation was performed followed by placement of an endoscopic biliary stent in the common bile duct. Contrast injection demonstrates mild prominence in caliber of the common bile duct above the level of stricture. IMPRESSION: Distal common bile duct stricture treated by balloon dilatation and placement of an endoscopic biliary stent. These images were submitted for radiologic interpretation only. Please see the procedural report for the amount of contrast and the fluoroscopy time utilized. Electronically Signed   By: Irish Lack M.D.   On: 07/13/2021 11:15   DG C-Arm 1-60 Min-No Report  Result Date: 07/13/2021 Fluoroscopy was utilized by the requesting physician.  No radiographic interpretation.   CT ABDOMEN PELVIS W CONTRAST  Result Date: 07/11/2021 CLINICAL DATA:  Bowel obstruction suspected. EXAM: CT ABDOMEN AND PELVIS WITH CONTRAST TECHNIQUE: Multidetector CT imaging of the abdomen and pelvis was performed using the standard protocol following bolus administration of  intravenous contrast. RADIATION DOSE REDUCTION: This exam was performed according to the departmental dose-optimization program which includes automated exposure control, adjustment of the mA and/or kV according to patient size and/or use of iterative reconstruction technique. CONTRAST:  OMNIPAQUE IOHEXOL 300 MG/ML  SOLN COMPARISON:  CT and MRI Jul 02, 2021 FINDINGS: Lower chest: No acute abnormality. Hepatobiliary: No significant interval change in the subtle hypodense hepatic lesions measuring up to 1.3 cm in the right lobe of the liver on image 21/2. Gallbladder is unremarkable. Similar mild prominence of the biliary tree without dilation. Pancreas: No significant interval change in the ill-defined hypoenhancing masslike area along the posterior pancreatic head which measures approximally 2.7 cm on image 32/2. Similar mild pancreatic ductal dilation without significant  pancreatic atrophy. Spleen: No splenomegaly or focal splenic lesion. Adrenals/Urinary Tract: Bilateral adrenal glands appear normal. No hydronephrosis. Kidneys demonstrate symmetric enhancement. No suspicious renal mass. Stomach/Bowel: Stomach is unremarkable for degree of distension. No pathologic dilation of small or large bowel. Terminal ileum appears normal. Normal appendix. Gas fluid levels throughout the colon suggestive of diarrheal illness. Vascular/Lymphatic: Normal caliber abdominal aorta. Enlarged retroperitoneal/upper abdominal lymph nodes are again seen an unchanged for instance an aortocaval lymph node measuring 15 mm in short axis on image 34/2. Reproductive: Prostate is unremarkable. Other: No significant abdominopelvic free fluid. Musculoskeletal: No aggressive lytic or blastic lesion of bone. IMPRESSION: 1. Gas fluid levels throughout the colon suggestive of diarrheal illness. No evidence of bowel obstruction. 2. No significant interval change in the hypodense/hypoenhancing masslike area in the pancreatic head with  associated ductal dilation. 3. Stable enlarged upper abdominal lymph nodes and subtle hypodense hepatic lesions. Electronically Signed   By: Maudry Mayhew M.D.   On: 07/11/2021 13:03   CT CHEST W CONTRAST  Result Date: 07/03/2021 CLINICAL DATA:  Pancreatic carcinoma staging EXAM: CT CHEST WITH CONTRAST TECHNIQUE: Multidetector CT imaging of the chest was performed during intravenous contrast administration. RADIATION DOSE REDUCTION: This exam was performed according to the departmental dose-optimization program which includes automated exposure control, adjustment of the mA and/or kV according to patient size and/or use of iterative reconstruction technique. CONTRAST:  75mL OMNIPAQUE IOHEXOL 300 MG/ML  SOLN COMPARISON:  None Available. FINDINGS: Cardiovascular: There is homogeneous enhancement in thoracic aorta. There are no filling defects in the central pulmonary artery branches. Mediastinum/Nodes: No significant lymphadenopathy is seen. Lungs/Pleura: There is no focal pulmonary consolidation. There are no discrete lung nodules. Small linear densities in the right posterior costophrenic angle may suggest minimal scarring or subsegmental atelectasis. There is no pleural effusion or pneumothorax. Upper Abdomen: There is 2.6 cm ill-defined low-attenuation lesion at the junction of head and body of pancreas. There is dilation of pancreatic duct in the body and tail. There is no dilation of bile ducts. Gallbladder is distended. There is no wall thickening in gallbladder. Small space-occupying lesions seen in the liver in the previous MRI could not be distinctly identified in the CT images. Musculoskeletal: There are no focal lytic or sclerotic lesions. Degenerative changes are noted in the thoracic spine. Gynecomastia is seen in both breasts. IMPRESSION: No significant abnormality is seen in the CT scan of chest. There is no significant lymphadenopathy. There are no discrete lung nodules. Low-density lesion seen  in the pancreas is better evaluated in the MRI examination. Electronically Signed   By: Ernie Avena M.D.   On: 07/03/2021 16:08   MR ABDOMEN MRCP W WO CONTAST  Result Date: 07/02/2021 CLINICAL DATA:  Abdominal pain, nausea/vomiting, possible pancreatic mass on CT EXAM: MRI ABDOMEN WITHOUT AND WITH CONTRAST (INCLUDING MRCP) TECHNIQUE: Multiplanar multisequence MR imaging of the abdomen was performed both before and after the administration of intravenous contrast. Heavily T2-weighted images of the biliary and pancreatic ducts were obtained, and three-dimensional MRCP images were rendered by post processing. CONTRAST:  10mL GADAVIST GADOBUTROL 1 MMOL/ML IV SOLN COMPARISON:  CT abdomen/pelvis dated 07/02/2021 FINDINGS: Motion degraded images. Lower chest: Lung bases are clear. Hepatobiliary: Two subtle liver lesions, measuring 11 mm in segment 8 (series 4/image 9) and 12 mm in segment 5 along the gallbladder fossa (series 4/image 21). These are poorly evaluated due to motion degradation, but enhancement is favored at least within the segment 5 lesion on postcontrast subtraction imaging (series 31/image  62). Associated restricted diffusion. Three additional smaller lesions are present on diffusion imaging (series 8/images 55 and 63). These findings are concerning for metastatic disease. Gallbladder is unremarkable. No cholelithiasis or inflammatory changes. No intrahepatic or extrahepatic ductal dilatation. No choledocholithiasis is evident. Pancreas: 3.1 x 2.3 cm hypoenhancing mass along the posteromedial aspect of the pancreatic head (series 22/image 67), suspicious for pancreatic neoplasm. Associated mild dilatation of the main pancreatic duct (series 4/image 20), which abruptly ends at the level of the suspected mass (series 4/image 23). No pancreatic atrophy. Spleen:  Within normal limits. Adrenals/Urinary Tract:  Adrenal glands are within normal limits. Kidneys are within normal limits.  No  hydronephrosis. Stomach/Bowel: Stomach is within normal limits. Visualized bowel is unremarkable. Vascular/Lymphatic:  No evidence of abdominal aortic aneurysm. Celiac artery, SMA, and portal vein appear distinct from the mass, without definite vascular invasion. Upper abdominal lymphadenopathy, including a 16 mm short axis portacaval node (series 4/image 21) and a 17 mm short axis right retrocaval node at the level of the right renal vein. Other:  No abdominal ascites. Musculoskeletal: No focal osseous lesions. IMPRESSION: 3.1 x 2.3 cm hypoenhancing mass along the posteromedial aspect of the pancreatic head, suspicious for pancreatic neoplasm. EUS is suggested. Multiple small liver lesions, suspicious for hepatic metastases. Upper abdominal/retroperitoneal lymphadenopathy, suspicious for nodal metastases. Electronically Signed   By: Charline Bills M.D.   On: 07/02/2021 20:16   MR 3D Recon At Scanner  Result Date: 07/02/2021 CLINICAL DATA:  Abdominal pain, nausea/vomiting, possible pancreatic mass on CT EXAM: MRI ABDOMEN WITHOUT AND WITH CONTRAST (INCLUDING MRCP) TECHNIQUE: Multiplanar multisequence MR imaging of the abdomen was performed both before and after the administration of intravenous contrast. Heavily T2-weighted images of the biliary and pancreatic ducts were obtained, and three-dimensional MRCP images were rendered by post processing. CONTRAST:  10mL GADAVIST GADOBUTROL 1 MMOL/ML IV SOLN COMPARISON:  CT abdomen/pelvis dated 07/02/2021 FINDINGS: Motion degraded images. Lower chest: Lung bases are clear. Hepatobiliary: Two subtle liver lesions, measuring 11 mm in segment 8 (series 4/image 9) and 12 mm in segment 5 along the gallbladder fossa (series 4/image 21). These are poorly evaluated due to motion degradation, but enhancement is favored at least within the segment 5 lesion on postcontrast subtraction imaging (series 31/image 62). Associated restricted diffusion. Three additional smaller lesions  are present on diffusion imaging (series 8/images 55 and 63). These findings are concerning for metastatic disease. Gallbladder is unremarkable. No cholelithiasis or inflammatory changes. No intrahepatic or extrahepatic ductal dilatation. No choledocholithiasis is evident. Pancreas: 3.1 x 2.3 cm hypoenhancing mass along the posteromedial aspect of the pancreatic head (series 22/image 67), suspicious for pancreatic neoplasm. Associated mild dilatation of the main pancreatic duct (series 4/image 20), which abruptly ends at the level of the suspected mass (series 4/image 23). No pancreatic atrophy. Spleen:  Within normal limits. Adrenals/Urinary Tract:  Adrenal glands are within normal limits. Kidneys are within normal limits.  No hydronephrosis. Stomach/Bowel: Stomach is within normal limits. Visualized bowel is unremarkable. Vascular/Lymphatic:  No evidence of abdominal aortic aneurysm. Celiac artery, SMA, and portal vein appear distinct from the mass, without definite vascular invasion. Upper abdominal lymphadenopathy, including a 16 mm short axis portacaval node (series 4/image 21) and a 17 mm short axis right retrocaval node at the level of the right renal vein. Other:  No abdominal ascites. Musculoskeletal: No focal osseous lesions. IMPRESSION: 3.1 x 2.3 cm hypoenhancing mass along the posteromedial aspect of the pancreatic head, suspicious for pancreatic neoplasm. EUS is suggested. Multiple small  liver lesions, suspicious for hepatic metastases. Upper abdominal/retroperitoneal lymphadenopathy, suspicious for nodal metastases. Electronically Signed   By: Charline Bills M.D.   On: 07/02/2021 20:16   CT ABDOMEN PELVIS W CONTRAST  Result Date: 07/02/2021 CLINICAL DATA:  Abdominal pain radiating to the back for 2 weeks, worse over the last 2 days. Some nausea vomiting. EXAM: CT ABDOMEN AND PELVIS WITH CONTRAST TECHNIQUE: Multidetector CT imaging of the abdomen and pelvis was performed using the standard  protocol following bolus administration of intravenous contrast. RADIATION DOSE REDUCTION: This exam was performed according to the departmental dose-optimization program which includes automated exposure control, adjustment of the mA and/or kV according to patient size and/or use of iterative reconstruction technique. CONTRAST:  OMNIPAQUE IOHEXOL 300 MG/ML  SOLN COMPARISON:  None Available. FINDINGS: Lower chest: Clear lung bases. Hepatobiliary: Liver normal in size. There are several subtle hypoattenuating liver lesions, not well-defined, most apparent at the lateral dome of segment 7, proximally 1.1 cm in size. No other liver abnormality. Gallbladder is distended, but without wall thickening or pericholecystic fluid. Common bile duct is mildly dilated, 8 mm proximally. Pancreas: There is a dystrophic appearing calcification in the upper pancreatic head surrounded by vague area of relative hypoattenuation, which may reflect a mass. The possible mass measures 2.6 cm. No other evidence of a pancreatic mass. The pancreatic duct distal to the possible mass is dilated to 4 mm. No pancreatic inflammation. Spleen: Normal in size without focal abnormality. Adrenals/Urinary Tract: Adrenal glands are unremarkable. Kidneys are normal, without renal calculi, focal lesion, or hydronephrosis. Bladder is unremarkable. Stomach/Bowel: Stomach is within normal limits. Appendix appears normal. No evidence of bowel wall thickening, distention, or inflammatory changes. Vascular/Lymphatic: Mildly enlarged retroperitoneal lymph nodes at the level of the upper pole the right kidney, posterior to the inferior vena cava, 1.5 x 1.4 cm in short axis ease. Mildly prominent, but subcentimeter peripancreatic and celiac lymph nodes. No other adenopathy. No vascular abnormality. Portal vein, splenic vein and superior mesenteric vein are widely patent. Reproductive: Unremarkable. Other: No abdominal wall hernia or abnormality. No  abdominopelvic ascites. Musculoskeletal: No fracture or acute finding.  No bone lesion. IMPRESSION: 1. Possible 2.6 cm pancreatic mass suspicious for neoplasm, associated with mild dilation of the pancreatic duct distal to the apparent mass, mild dilation of the common bile duct and 2 adjacent enlarged retroperitoneal lymph nodes. There are also subtle low-attenuation lesions in the liver that are not well-defined, possibly metastatic disease. Recommend follow-up pancreatic MRI without and with contrast for further assessment. 2. No other abnormalities.  No acute findings. Electronically Signed   By: Amie Portland M.D.   On: 07/02/2021 13:50    Labs:  CBC: Recent Labs    07/04/21 0538 07/11/21 0930 07/12/21 0543 07/23/21 0753  WBC 6.8 10.5 7.3 10.7*  HGB 14.5 14.6 13.0 12.8*  HCT 45.3 45.6 40.2 37.6*  PLT 309 297 281 414*    COAGS: Recent Labs    07/04/21 0538  INR 1.0    BMP: Recent Labs    07/13/21 1135 07/14/21 0605 07/15/21 0618 07/23/21 0753  NA 139 142 142 139  K 4.1 3.8 3.6 3.8  CL 105 109 109 104  CO2 23 25 25 26   GLUCOSE 146* 99 94 105*  BUN 10 7 7 8   CALCIUM 10.0 9.3 9.0 10.0  CREATININE 0.89 0.80 0.73 0.91  GFRNONAA >60 >60 >60 >60    LIVER FUNCTION TESTS: Recent Labs    07/13/21 1135 07/14/21 0605 07/15/21 0618  07/23/21 0753  BILITOT 8.0* 3.9* 3.1* 1.7*  AST 293* 191* 170* 51*  ALT 613* 659* 601* 135*  ALKPHOS 366* 314* 260* 185*  PROT 7.9 7.1 7.1 7.9  ALBUMIN 3.9 3.5 3.6 4.4    Assessment and Plan:  Neuroendocrine Tumor of the pancreas --for chemotherapy start tomorrow --for port-a-cath placement today with planned d/c home with mother this afternoon.  Risks and benefits of image guided port-a-catheter placement was discussed with the patient including, but not limited to bleeding, infection, pneumothorax, or fibrin sheath development and need for additional procedures.  All of the patient's questions were answered, patient is agreeable to  proceed. Consent signed and in chart.   Thank you for this interesting consult.  I greatly enjoyed meeting Leon Fischer. and look forward to participating in their care.  A copy of this report was sent to the requesting provider on this date.  Electronically Signed: Sheliah Plane, PA 07/31/2021, 12:22 PM   I spent a total of 15 Minutes  in face to face in clinical consultation, greater than 50% of which was counseling/coordinating care for port placment

## 2021-08-01 ENCOUNTER — Other Ambulatory Visit: Payer: Self-pay

## 2021-08-01 ENCOUNTER — Inpatient Hospital Stay: Payer: 59

## 2021-08-01 ENCOUNTER — Other Ambulatory Visit: Payer: Self-pay | Admitting: Hematology

## 2021-08-01 VITALS — BP 135/89 | HR 85 | Temp 98.9°F | Resp 16 | Wt 312.8 lb

## 2021-08-01 DIAGNOSIS — Z5111 Encounter for antineoplastic chemotherapy: Secondary | ICD-10-CM | POA: Diagnosis not present

## 2021-08-01 DIAGNOSIS — C7A1 Malignant poorly differentiated neuroendocrine tumors: Secondary | ICD-10-CM

## 2021-08-01 DIAGNOSIS — K8689 Other specified diseases of pancreas: Secondary | ICD-10-CM

## 2021-08-01 DIAGNOSIS — Z7189 Other specified counseling: Secondary | ICD-10-CM

## 2021-08-01 DIAGNOSIS — Z95828 Presence of other vascular implants and grafts: Secondary | ICD-10-CM

## 2021-08-01 LAB — GUARDANT 360

## 2021-08-01 LAB — CBC WITH DIFFERENTIAL (CANCER CENTER ONLY)
Abs Immature Granulocytes: 0.01 10*3/uL (ref 0.00–0.07)
Basophils Absolute: 0 10*3/uL (ref 0.0–0.1)
Basophils Relative: 0 %
Eosinophils Absolute: 0.5 10*3/uL (ref 0.0–0.5)
Eosinophils Relative: 7 %
HCT: 36.4 % — ABNORMAL LOW (ref 39.0–52.0)
Hemoglobin: 12.2 g/dL — ABNORMAL LOW (ref 13.0–17.0)
Immature Granulocytes: 0 %
Lymphocytes Relative: 19 %
Lymphs Abs: 1.2 10*3/uL (ref 0.7–4.0)
MCH: 29.8 pg (ref 26.0–34.0)
MCHC: 33.5 g/dL (ref 30.0–36.0)
MCV: 88.8 fL (ref 80.0–100.0)
Monocytes Absolute: 0.6 10*3/uL (ref 0.1–1.0)
Monocytes Relative: 8 %
Neutro Abs: 4.3 10*3/uL (ref 1.7–7.7)
Neutrophils Relative %: 66 %
Platelet Count: 344 10*3/uL (ref 150–400)
RBC: 4.1 MIL/uL — ABNORMAL LOW (ref 4.22–5.81)
RDW: 14 % (ref 11.5–15.5)
WBC Count: 6.5 10*3/uL (ref 4.0–10.5)
nRBC: 0 % (ref 0.0–0.2)

## 2021-08-01 LAB — CMP (CANCER CENTER ONLY)
ALT: 209 U/L — ABNORMAL HIGH (ref 0–44)
AST: 113 U/L — ABNORMAL HIGH (ref 15–41)
Albumin: 4.1 g/dL (ref 3.5–5.0)
Alkaline Phosphatase: 163 U/L — ABNORMAL HIGH (ref 38–126)
Anion gap: 5 (ref 5–15)
BUN: 8 mg/dL (ref 6–20)
CO2: 28 mmol/L (ref 22–32)
Calcium: 9.3 mg/dL (ref 8.9–10.3)
Chloride: 108 mmol/L (ref 98–111)
Creatinine: 0.7 mg/dL (ref 0.61–1.24)
GFR, Estimated: >60 mL/min (ref >60)
Glucose, Bld: 104 mg/dL — ABNORMAL HIGH (ref 70–99)
Potassium: 4.2 mmol/L (ref 3.5–5.1)
Sodium: 141 mmol/L (ref 135–145)
Total Bilirubin: 1 mg/dL (ref 0.3–1.2)
Total Protein: 7 g/dL (ref 6.5–8.1)

## 2021-08-01 MED ORDER — SODIUM CHLORIDE 0.9 % IV SOLN: Freq: Once | INTRAVENOUS | Status: AC

## 2021-08-01 MED ORDER — SODIUM CHLORIDE 0.9% FLUSH
10.0000 mL | INTRAVENOUS | Status: DC | PRN
  Administered 2021-08-01: 10 mL

## 2021-08-01 MED ORDER — SODIUM CHLORIDE 0.9 % IV SOLN
150.0000 mg | Freq: Once | INTRAVENOUS | Status: AC
  Administered 2021-08-01: 150 mg via INTRAVENOUS
  Filled 2021-08-01: qty 150

## 2021-08-01 MED ORDER — SODIUM CHLORIDE 0.9 % IV SOLN
10.0000 mg | Freq: Once | INTRAVENOUS | Status: AC
  Administered 2021-08-01: 10 mg via INTRAVENOUS
  Filled 2021-08-01: qty 10

## 2021-08-01 MED ORDER — FAMOTIDINE IN NACL 20-0.9 MG/50ML-% IV SOLN
20.0000 mg | Freq: Once | INTRAVENOUS | Status: AC
  Administered 2021-08-01: 20 mg via INTRAVENOUS
  Filled 2021-08-01: qty 50

## 2021-08-01 MED ORDER — HEPARIN SOD (PORK) LOCK FLUSH 100 UNIT/ML IV SOLN
500.0000 [IU] | Freq: Once | INTRAVENOUS | Status: AC | PRN
  Administered 2021-08-01: 500 [IU]

## 2021-08-01 MED ORDER — ACETAMINOPHEN 500 MG PO TABS
1000.0000 mg | ORAL_TABLET | Freq: Once | ORAL | Status: AC
  Administered 2021-08-01: 1000 mg via ORAL
  Filled 2021-08-01: qty 2

## 2021-08-01 MED ORDER — SODIUM CHLORIDE 0.9% FLUSH
10.0000 mL | Freq: Once | INTRAVENOUS | Status: AC
  Administered 2021-08-01: 10 mL

## 2021-08-01 MED ORDER — SODIUM CHLORIDE 0.9 % IV SOLN
80.0000 mg/m2 | Freq: Once | INTRAVENOUS | Status: AC
  Administered 2021-08-01: 220 mg via INTRAVENOUS
  Filled 2021-08-01: qty 11

## 2021-08-01 MED ORDER — PALONOSETRON HCL INJECTION 0.25 MG/5ML
0.2500 mg | Freq: Once | INTRAVENOUS | Status: AC
  Administered 2021-08-01: 0.25 mg via INTRAVENOUS
  Filled 2021-08-01: qty 5

## 2021-08-01 MED ORDER — SODIUM CHLORIDE 0.9 % IV SOLN
750.0000 mg | Freq: Once | INTRAVENOUS | Status: AC
  Administered 2021-08-01: 750 mg via INTRAVENOUS
  Filled 2021-08-01: qty 75

## 2021-08-01 NOTE — Progress Notes (Signed)
Patient here for day 1, cycle 1 of carboplatin/etoposide and his AST is 113 and his AST is 209. Per Dr. Irene Limbo ok to treat today. Dose of etoposide was reduced. BP is also higher at 137/102- mentioned to MD.

## 2021-08-01 NOTE — Patient Instructions (Signed)
[facility]  Discharge Instructions: Thank you for choosing [facility] to provide your oncology and hematology care.   If you have a lab appointment with the Cancer Center, please go directly to the Cancer Center and check in at the registration area.   Wear comfortable clothing and clothing appropriate for easy access to any Portacath or PICC line.   We strive to give you quality time with your provider. You may need to reschedule your appointment if you arrive late (15 or more minutes).  Arriving late affects you and other patients whose appointments are after yours.  Also, if you miss three or more appointments without notifying the office, you may be dismissed from the clinic at the provider's discretion.      For prescription refill requests, have your pharmacy contact our office and allow 72 hours for refills to be completed.    Today you received the following chemotherapy and/or immunotherapy agents: Carboplatin, Etoposide      To help prevent nausea and vomiting after your treatment, we encourage you to take your nausea medication as directed.  BELOW ARE SYMPTOMS THAT SHOULD BE REPORTED IMMEDIATELY: *FEVER GREATER THAN 100.4 F (38 C) OR HIGHER *CHILLS OR SWEATING *NAUSEA AND VOMITING THAT IS NOT CONTROLLED WITH YOUR NAUSEA MEDICATION *UNUSUAL SHORTNESS OF BREATH *UNUSUAL BRUISING OR BLEEDING *URINARY PROBLEMS (pain or burning when urinating, or frequent urination) *BOWEL PROBLEMS (unusual diarrhea, constipation, pain near the anus) TENDERNESS IN MOUTH AND THROAT WITH OR WITHOUT PRESENCE OF ULCERS (sore throat, sores in mouth, or a toothache) UNUSUAL RASH, SWELLING OR PAIN  UNUSUAL VAGINAL DISCHARGE OR ITCHING   Items with * indicate a potential emergency and should be followed up as soon as possible or go to the Emergency Department if any problems should occur.  Please show the CHEMOTHERAPY ALERT CARD or IMMUNOTHERAPY ALERT CARD at  check-in to the Emergency Department and triage nurse.  Should you have questions after your visit or need to cancel or reschedule your appointment, please contact [facility]  Dept: [phone]  and follow the prompts.  Office hours are 8:00 a.m. to 4:30 p.m. Monday - Friday. Please note that voicemails left after 4:00 p.m. may not be returned until the following business day.  We are closed weekends and major holidays. You have access to a nurse at all times for urgent questions. Please call the main number to the clinic Dept: [phone] and follow the prompts.   For any non-urgent questions, you may also contact your provider using MyChart. We now offer e-Visits for anyone 18 and older to request care online for non-urgent symptoms. For details visit [email or web address].   Also download the MyChart app! Go to the app store, search "MyChart", open the app, select [facility], and log in with your MyChart username and password.  Masks are optional in the cancer centers. If you would like for your care team to wear a mask while they are taking care of you, please let them know. For doctor visits, patients may have with them one support person who is at least 31 years old. At this time, visitors are not allowed in the infusion area. Carboplatin injection What is this medication? CARBOPLATIN (KAR boe pla tin) is a chemotherapy drug. It targets fast dividing cells, like cancer cells, and causes these cells to die. This medicine is used to treat ovarian cancer and many other cancers. This medicine may be used for other purposes; ask   your health care provider or pharmacist if you have questions. COMMON BRAND NAME(S): Paraplatin What should I tell my care team before I take this medication? They need to know if you have any of these conditions: blood disorders hearing problems kidney disease recent or ongoing radiation therapy an unusual or allergic reaction to  carboplatin, cisplatin, other chemotherapy, other medicines, foods, dyes, or preservatives pregnant or trying to get pregnant breast-feeding How should I use this medication? This drug is usually given as an infusion into a vein. It is administered in a hospital or clinic by a specially trained health care professional. Talk to your pediatrician regarding the use of this medicine in children. Special care may be needed. Overdosage: If you think you have taken too much of this medicine contact a poison control center or emergency room at once. NOTE: This medicine is only for you. Do not share this medicine with others. What if I miss a dose? It is important not to miss a dose. Call your doctor or health care professional if you are unable to keep an appointment. What may interact with this medication? medicines for seizures medicines to increase blood counts like filgrastim, pegfilgrastim, sargramostim some antibiotics like amikacin, gentamicin, neomycin, streptomycin, tobramycin vaccines Talk to your doctor or health care professional before taking any of these medicines: acetaminophen aspirin ibuprofen ketoprofen naproxen This list may not describe all possible interactions. Give your health care provider a list of all the medicines, herbs, non-prescription drugs, or dietary supplements you use. Also tell them if you smoke, drink alcohol, or use illegal drugs. Some items may interact with your medicine. What should I watch for while using this medication? Your condition will be monitored carefully while you are receiving this medicine. You will need important blood work done while you are taking this medicine. This drug may make you feel generally unwell. This is not uncommon, as chemotherapy can affect healthy cells as well as cancer cells. Report any side effects. Continue your course of treatment even though you feel ill unless your doctor tells you to stop. In some cases, you may be  given additional medicines to help with side effects. Follow all directions for their use. Call your doctor or health care professional for advice if you get a fever, chills or sore throat, or other symptoms of a cold or flu. Do not treat yourself. This drug decreases your body's ability to fight infections. Try to avoid being around people who are sick. This medicine may increase your risk to bruise or bleed. Call your doctor or health care professional if you notice any unusual bleeding. Be careful brushing and flossing your teeth or using a toothpick because you may get an infection or bleed more easily. If you have any dental work done, tell your dentist you are receiving this medicine. Avoid taking products that contain aspirin, acetaminophen, ibuprofen, naproxen, or ketoprofen unless instructed by your doctor. These medicines may hide a fever. Do not become pregnant while taking this medicine. Women should inform their doctor if they wish to become pregnant or think they might be pregnant. There is a potential for serious side effects to an unborn child. Talk to your health care professional or pharmacist for more information. Do not breast-feed an infant while taking this medicine. What side effects may I notice from receiving this medication? Side effects that you should report to your doctor or health care professional as soon as possible: allergic reactions like skin rash, itching or hives, swelling of   the face, lips, or tongue signs of infection - fever or chills, cough, sore throat, pain or difficulty passing urine signs of decreased platelets or bleeding - bruising, pinpoint red spots on the skin, black, tarry stools, nosebleeds signs of decreased red blood cells - unusually weak or tired, fainting spells, lightheadedness breathing problems changes in hearing changes in vision chest pain high blood pressure low blood counts - This drug may decrease the number of white blood cells, red  blood cells and platelets. You may be at increased risk for infections and bleeding. nausea and vomiting pain, swelling, redness or irritation at the injection site pain, tingling, numbness in the hands or feet problems with balance, talking, walking trouble passing urine or change in the amount of urine Side effects that usually do not require medical attention (report to your doctor or health care professional if they continue or are bothersome): hair loss loss of appetite metallic taste in the mouth or changes in taste This list may not describe all possible side effects. Call your doctor for medical advice about side effects. You may report side effects to FDA at 1-800-FDA-1088. Where should I keep my medication? This drug is given in a hospital or clinic and will not be stored at home. NOTE: This sheet is a summary. It may not cover all possible information. If you have questions about this medicine, talk to your doctor, pharmacist, or health care provider.  2023 Elsevier/Gold Standard (2007-07-01 00:00:00) Etoposide, VP-16 capsules What is this medication? ETOPOSIDE, VP-16 (e toe POE side) is a chemotherapy drug. It is used to treat small cell lung cancer and other cancers. This medicine may be used for other purposes; ask your health care provider or pharmacist if you have questions. COMMON BRAND NAME(S): VePesid What should I tell my care team before I take this medication? They need to know if you have any of these conditions: infection kidney disease liver disease low blood counts, like low white cell, platelet, or red cell counts an unusual or allergic reaction to etoposide, other medicines, foods, dyes, or preservatives pregnant or trying to get pregnant breast-feeding How should I use this medication? Take this medicine by mouth with a glass of water. Follow the directions on the prescription label. Do not open, crush, or chew the capsules. It is advisable to wear gloves  when handling this medicine. Take your medicine at regular intervals. Do not take it more often than directed. Do not stop taking except on your doctor's advice. Talk to your pediatrician regarding the use of this medicine in children. Special care may be needed. Overdosage: If you think you have taken too much of this medicine contact a poison control center or emergency room at once. NOTE: This medicine is only for you. Do not share this medicine with others. What if I miss a dose? If you miss a dose, take it as soon as you can. If it is almost time for your next dose, take only that dose. Do not take double or extra doses. What may interact with this medication? This medicine may interact with the following medications: cyclosporine warfarin This list may not describe all possible interactions. Give your health care provider a list of all the medicines, herbs, non-prescription drugs, or dietary supplements you use. Also tell them if you smoke, drink alcohol, or use illegal drugs. Some items may interact with your medicine. What should I watch for while using this medication? Visit your doctor for checks on your progress. This drug   may make you feel generally unwell. This is not uncommon, as chemotherapy can affect healthy cells as well as cancer cells. Report any side effects. Continue your course of treatment even though you feel ill unless your doctor tells you to stop. In some cases, you may be given additional medicines to help with side effects. Follow all directions for their use. Call your doctor or health care professional for advice if you get a fever, chills or sore throat, or other symptoms of a cold or flu. Do not treat yourself. This drug decreases your body's ability to fight infections. Try to avoid being around people who are sick. This medicine may increase your risk to bruise or bleed. Call your doctor or health care professional if you notice any unusual bleeding. Talk to your  doctor about your risk of cancer. You may be more at risk for certain types of cancers if you take this medicine. Do not become pregnant while taking this medicine or for at least 6 months after stopping it. Women should inform their doctor if they wish to become pregnant or think they might be pregnant. Women of child-bearing potential will need to have a negative pregnancy test before starting this medicine. There is a potential for serious side effects to an unborn child. Talk to your health care professional or pharmacist for more information. Do not breast-feed an infant while taking this medicine. Men must use a latex condom during sexual contact with a woman while taking this medicine and for at least 4 months after stopping it. A latex condom is needed even if you have had a vasectomy. Contact your doctor right away if your partner becomes pregnant. Do not donate sperm while taking this medicine and for 4 months after you stop taking this medicine. Men should inform their doctors if they wish to father a child. This medicine may lower sperm counts. What side effects may I notice from receiving this medication? Side effects that you should report to your doctor or health care professional as soon as possible: allergic reactions like skin rash, itching or hives, swelling of the face, lips, or tongue low blood counts - this medicine may decrease the number of white blood cells, red blood cells, and platelets. You may be at increased risk for infections and bleeding nausea, vomiting redness, blistering, peeling or loosening of the skin, including inside the mouth signs and symptoms of infection like fever; chills; cough; sore throat; pain or trouble passing urine signs and symptoms of low red blood cells or anemia such as unusually weak or tired; feeling faint or lightheaded; falls; breathing problems unusual bruising or bleeding Side effects that usually do not require medical attention (report to  your doctor or health care professional if they continue or are bothersome): changes in taste diarrhea hair loss loss of appetite mouth sores This list may not describe all possible side effects. Call your doctor for medical advice about side effects. You may report side effects to FDA at 1-800-FDA-1088. Where should I keep my medication? Keep out of the reach of children. Store in a refrigerator between 2 and 8 degrees C (36 and 46 degrees F). Do not freeze. Throw away any unused medicine after the expiration date. NOTE: This sheet is a summary. It may not cover all possible information. If you have questions about this medicine, talk to your doctor, pharmacist, or health care provider.  2023 Elsevier/Gold Standard (2020-12-22 00:00:00)  

## 2021-08-02 ENCOUNTER — Inpatient Hospital Stay: Payer: 59

## 2021-08-02 VITALS — BP 154/88 | HR 86 | Temp 98.1°F | Resp 17

## 2021-08-02 DIAGNOSIS — C7A1 Malignant poorly differentiated neuroendocrine tumors: Secondary | ICD-10-CM

## 2021-08-02 DIAGNOSIS — Z5111 Encounter for antineoplastic chemotherapy: Secondary | ICD-10-CM | POA: Diagnosis not present

## 2021-08-02 DIAGNOSIS — Z7189 Other specified counseling: Secondary | ICD-10-CM

## 2021-08-02 MED ORDER — HEPARIN SOD (PORK) LOCK FLUSH 100 UNIT/ML IV SOLN
500.0000 [IU] | Freq: Once | INTRAVENOUS | Status: AC | PRN
  Administered 2021-08-02: 500 [IU]

## 2021-08-02 MED ORDER — SODIUM CHLORIDE 0.9 % IV SOLN
80.0000 mg/m2 | Freq: Once | INTRAVENOUS | Status: AC
  Administered 2021-08-02: 220 mg via INTRAVENOUS
  Filled 2021-08-02: qty 11

## 2021-08-02 MED ORDER — SODIUM CHLORIDE 0.9 % IV SOLN: Freq: Once | INTRAVENOUS | Status: AC

## 2021-08-02 MED ORDER — SODIUM CHLORIDE 0.9% FLUSH
10.0000 mL | INTRAVENOUS | Status: DC | PRN
  Administered 2021-08-02: 10 mL

## 2021-08-02 MED ORDER — SODIUM CHLORIDE 0.9 % IV SOLN
10.0000 mg | Freq: Once | INTRAVENOUS | Status: AC
  Administered 2021-08-02: 10 mg via INTRAVENOUS
  Filled 2021-08-02: qty 10

## 2021-08-02 NOTE — Progress Notes (Signed)
Per MD/RN note from yesterday, ok to proceed with labs dated 08/01/2021

## 2021-08-02 NOTE — Patient Instructions (Signed)
[facility]  Discharge Instructions: Thank you for choosing [facility] to provide your oncology and hematology care.   If you have a lab appointment with the Cancer Center, please go directly to the Cancer Center and check in at the registration area.   Wear comfortable clothing and clothing appropriate for easy access to any Portacath or PICC line.   We strive to give you quality time with your provider. You may need to reschedule your appointment if you arrive late (15 or more minutes).  Arriving late affects you and other patients whose appointments are after yours.  Also, if you miss three or more appointments without notifying the office, you may be dismissed from the clinic at the provider's discretion.      For prescription refill requests, have your pharmacy contact our office and allow 72 hours for refills to be completed.    Today you received the following chemotherapy and/or immunotherapy agent: Etoposide      To help prevent nausea and vomiting after your treatment, we encourage you to take your nausea medication as directed.  BELOW ARE SYMPTOMS THAT SHOULD BE REPORTED IMMEDIATELY: *FEVER GREATER THAN 100.4 F (38 C) OR HIGHER *CHILLS OR SWEATING *NAUSEA AND VOMITING THAT IS NOT CONTROLLED WITH YOUR NAUSEA MEDICATION *UNUSUAL SHORTNESS OF BREATH *UNUSUAL BRUISING OR BLEEDING *URINARY PROBLEMS (pain or burning when urinating, or frequent urination) *BOWEL PROBLEMS (unusual diarrhea, constipation, pain near the anus) TENDERNESS IN MOUTH AND THROAT WITH OR WITHOUT PRESENCE OF ULCERS (sore throat, sores in mouth, or a toothache) UNUSUAL RASH, SWELLING OR PAIN  UNUSUAL VAGINAL DISCHARGE OR ITCHING   Items with * indicate a potential emergency and should be followed up as soon as possible or go to the Emergency Department if any problems should occur.  Please show the CHEMOTHERAPY ALERT CARD or IMMUNOTHERAPY ALERT CARD at check-in to  the Emergency Department and triage nurse.  Should you have questions after your visit or need to cancel or reschedule your appointment, please contact [facility]  Dept: [phone]  and follow the prompts.  Office hours are 8:00 a.m. to 4:30 p.m. Monday - Friday. Please note that voicemails left after 4:00 p.m. may not be returned until the following business day.  We are closed weekends and major holidays. You have access to a nurse at all times for urgent questions. Please call the main number to the clinic Dept: [phone] and follow the prompts.   For any non-urgent questions, you may also contact your provider using MyChart. We now offer e-Visits for anyone 18 and older to request care online for non-urgent symptoms. For details visit [email or web address].   Also download the MyChart app! Go to the app store, search "MyChart", open the app, select [facility], and log in with your MyChart username and password.  Masks are optional in the cancer centers. If you would like for your care team to wear a mask while they are taking care of you, please let them know. For doctor visits, patients may have with them one support person who is at least 31 years old. At this time, visitors are not allowed in the infusion area. 

## 2021-08-03 ENCOUNTER — Inpatient Hospital Stay: Payer: 59

## 2021-08-03 ENCOUNTER — Inpatient Hospital Stay: Payer: 59 | Admitting: Hematology

## 2021-08-03 ENCOUNTER — Other Ambulatory Visit: Payer: Self-pay

## 2021-08-03 VITALS — BP 155/99 | HR 68 | Temp 98.4°F | Resp 16

## 2021-08-03 VITALS — BP 145/91 | HR 78 | Temp 98.1°F | Resp 18 | Ht 72.0 in | Wt 324.6 lb

## 2021-08-03 DIAGNOSIS — Z5111 Encounter for antineoplastic chemotherapy: Secondary | ICD-10-CM | POA: Diagnosis not present

## 2021-08-03 DIAGNOSIS — Z7189 Other specified counseling: Secondary | ICD-10-CM

## 2021-08-03 DIAGNOSIS — C7A1 Malignant poorly differentiated neuroendocrine tumors: Secondary | ICD-10-CM | POA: Diagnosis not present

## 2021-08-03 MED ORDER — SODIUM CHLORIDE 0.9 % IV SOLN: Freq: Once | INTRAVENOUS | Status: AC

## 2021-08-03 MED ORDER — HEPARIN SOD (PORK) LOCK FLUSH 100 UNIT/ML IV SOLN
500.0000 [IU] | Freq: Once | INTRAVENOUS | Status: AC | PRN
  Administered 2021-08-03: 500 [IU]

## 2021-08-03 MED ORDER — SODIUM CHLORIDE 0.9 % IV SOLN
80.0000 mg/m2 | Freq: Once | INTRAVENOUS | Status: AC
  Administered 2021-08-03: 220 mg via INTRAVENOUS
  Filled 2021-08-03: qty 11

## 2021-08-03 MED ORDER — PEGFILGRASTIM 6 MG/0.6ML ~~LOC~~ PSKT
6.0000 mg | PREFILLED_SYRINGE | Freq: Once | SUBCUTANEOUS | Status: AC
  Administered 2021-08-03: 6 mg via SUBCUTANEOUS
  Filled 2021-08-03: qty 0.6

## 2021-08-03 MED ORDER — SODIUM CHLORIDE 0.9% FLUSH
10.0000 mL | INTRAVENOUS | Status: DC | PRN
  Administered 2021-08-03: 10 mL

## 2021-08-03 MED ORDER — SODIUM CHLORIDE 0.9 % IV SOLN
10.0000 mg | Freq: Once | INTRAVENOUS | Status: AC
  Administered 2021-08-03: 10 mg via INTRAVENOUS
  Filled 2021-08-03: qty 10

## 2021-08-03 NOTE — Progress Notes (Signed)
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  HISTORY OF PRESENTING ILLNESS:  Leon Schantz. is a wonderful 31 y.o. 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 He presents today accompanied by his mother. He reports He is doing well.  He reports no color change in stool.  He notes that he is eating well but feels food just sits in stomach.  We discussed getting additional scans and labs for further evaluation and treatment which he was agreeable to. We further discussed getting MRI of brain and PET scan which he was agreeable to.  He notes that he is trying to hold it together mentally and is still trying to process his new diagnosis. We discussed the options of psychological or spiritual counseling which he has not decided on at this time. He was advised that may call back if he decides that he would like to pursue either.  We discussed getting a port a cath installed which he was agreeable to.  He recently had a biliary stent put into common bile duct.   No abdominal pain or change in bowel habits. No new or unexpected weight loss. No other new or acute focal symptoms.  We discussed his recent stomach biopsy done 07/13/2021 which sowed gastropathy and minor chronic gastritis with lymphoid aggregate. Negative for H. Pylori.  We discussed recent cytology done 07/13/2021 which showed presence of malignant cells in pancreas and poorly differentiated/high grade neuroendocrine carcinoma.  We  discussed upper endoscopy done 07/13/2021 revealed mass in pancreatic head and some enlarged lymph nodes were seen in the celiac and peripancreatic regions.  Labs done today were reviewed in detail.  INTERVAL HISTORY:  Leon Liv. Is a 31 y.o. 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 He reports He is doing well with no new symptoms or concerns.  He reports some grade 1 fatigue following treatment.  He reports he is tolerating the carboplatin etoposide chemotherapy well with no prohibitive toxicities at this time.  He would like to reschedule cycle 3 treatment appointment to August 16th.  We discussed recent hormonal studies done 07/23/2021 which showed counts WNL. Functional hormonal secretion from tumor noted.  No leg swelling. No new or unexpected weight loss. No other new or acute focal symptoms.  Labs done 08/01/2021 were reviewed in detail.  We discussed his MRI brain w w/o contrast done 07/29/2021.  MEDICAL HISTORY:  Past Medical History:  Diagnosis Date   Asthma     SURGICAL HISTORY: Past Surgical History:  Procedure Laterality Date   BILIARY DILATION  07/13/2021   Procedure: BILIARY DILATION;  Surgeon: Vida Rigger, MD;  Location: [facility];  Service: Gastroenterology;;   BILIARY STENT PLACEMENT N/A 07/13/2021   Procedure: BILIARY STENT PLACEMENT;  Surgeon: Vida Rigger, MD;  Location: [name];  Service: Gastroenterology;  Laterality: N/A;   BIOPSY  07/13/2021   Procedure: BIOPSY;  Surgeon: Meridee Score Netty Starring., MD;  Location: [name];  Service: Gastroenterology;;   ENDOSCOPIC RETROGRADE CHOLANGIOPANCREATOGRAPHY (ERCP) WITH PROPOFOL N/A 07/13/2021   Procedure: ENDOSCOPIC RETROGRADE CHOLANGIOPANCREATOGRAPHY (ERCP) WITH PROPOFOL;  Surgeon:  Vida Rigger, MD;  Location: [facility];  Service: Gastroenterology;  Laterality: N/A;   ESOPHAGOGASTRODUODENOSCOPY (EGD) WITH PROPOFOL N/A  07/13/2021   Procedure: ESOPHAGOGASTRODUODENOSCOPY (EGD) WITH PROPOFOL;  Surgeon: Meridee Score Netty Starring., MD;  Location: [name];  Service: Gastroenterology;  Laterality: N/A;   EUS N/A 07/13/2021   Procedure: UPPER ENDOSCOPIC ULTRASOUND (EUS) LINEAR;  Surgeon: Lemar Lofty., MD;  Location: [name];  Service: Gastroenterology;  Laterality: N/A;   FINE NEEDLE ASPIRATION  07/13/2021   Procedure: FINE NEEDLE ASPIRATION (FNA) LINEAR;  Surgeon: Lemar Lofty., MD;  Location: [facility];  Service: Gastroenterology;;   IR IMAGING GUIDED PORT INSERTION  07/31/2021   SPHINCTEROTOMY  07/13/2021   Procedure: Dennison Mascot;  Surgeon: Vida Rigger, MD;  Location: [name];  Service: Gastroenterology;;    SOCIAL HISTORY: Social History   Socioeconomic History   Marital status: Single    Spouse name: Not on file   Number of children: 0   Years of education: Not on file   Highest education level: Some college, no degree  Occupational History   Not on file  Tobacco Use   Smoking status: Some Days   Smokeless tobacco: Never  Vaping Use   Vaping Use: Never used  Substance and Sexual Activity   Alcohol use: Yes    Comment: social   Drug use: No   Sexual activity: Never  Other Topics Concern   Not on file  Social History Narrative   Not on file   Social Determinants of Health   Financial Resource Strain: Low Risk  (04/14/2017)   Overall Financial Resource Strain (CARDIA)    Difficulty of Paying Living Expenses: Not hard at all  Food Insecurity: No Food Insecurity (04/14/2017)   Hunger Vital Sign    Worried About Running Out of Food in the Last Year: Never true    Ran Out of Food in the Last Year: Never true  Transportation Needs: No Transportation Needs (04/14/2017)   PRAPARE - Administrator, Civil Service (Medical): No    Lack of Transportation (Non-Medical): No  Physical Activity: Inactive (04/14/2017)   Exercise Vital Sign    Days of Exercise per  Week: 0 days    Minutes of Exercise per Session: 0 min  Stress: Stress Concern Present (04/14/2017)   [name] of Occupational Health - Occupational Stress Questionnaire    Feeling of Stress : Rather much  Social Connections: Moderately Isolated (04/14/2017)   Social Connection and Isolation Panel [NHANES]    Frequency of Communication with Friends and Family: More than three times a week    Frequency of Social Gatherings with Friends and Family: More than three times a week    Attends Religious Services: Never    Database administrator or Organizations: No    Attends Banker Meetings: Never    Marital Status: Never married  Intimate Partner Violence: Not At Risk (04/14/2017)   Humiliation, Afraid, Rape, and Kick questionnaire    Fear of Current or Ex-Partner: No    Emotionally Abused: No    Physically Abused: No    Sexually Abused: No    FAMILY HISTORY: Family History  Problem Relation Age of Onset   Drug abuse Father    ALLERGIES:  is allergic to shellfish allergy and other.  MEDICATIONS:  Current Outpatient Medications  Medication Sig Dispense Refill   albuterol (VENTOLIN HFA) 108 (90 Base) MCG/ACT inhaler Inhale 2 puffs into the lungs every 4 (four) hours as needed for wheezing  or shortness of breath. 18 g 0   dexamethasone (DECADRON) 4 MG tablet Take 2 tablets (8 mg total) by mouth daily. Start the day after chemotherapy for 1 day. 30 tablet 1   lidocaine-prilocaine (EMLA) cream Apply to affected area once 30 g 3   LORazepam (ATIVAN) 0.5 MG tablet Take 1 tablet (0.5 mg total) by mouth every 6 (six) hours as needed (Nausea or vomiting). 30 tablet 0   ondansetron (ZOFRAN) 8 MG tablet Take 1 tablet (8 mg total) by mouth 2 (two) times daily as needed for refractory nausea / vomiting. Start on day 3 after carboplatin chemo. 30 tablet 1   oxyCODONE (OXY IR/ROXICODONE) 5 MG immediate release tablet Take 1 tablet (5 mg total) by mouth every 6 (six) hours as needed  for moderate pain or breakthrough pain. 12 tablet 0   pantoprazole (PROTONIX) 40 MG tablet Take 1 tablet (40 mg total) by mouth daily. 30 tablet 1   polyethylene glycol (MIRALAX / GLYCOLAX) 17 g packet Take 17 g by mouth daily. 14 each 0   prochlorperazine (COMPAZINE) 10 MG tablet Take 1 tablet (10 mg total) by mouth every 6 (six) hours as needed (Nausea or vomiting). 30 tablet 1   senna-docusate (SENOKOT-S) 8.6-50 MG tablet Take 2 tablets by mouth 2 (two) times daily. 30 tablet 0   No current facility-administered medications for this visit.   Facility-Administered Medications Ordered in Other Visits  Medication Dose Route Frequency Provider Last Rate Last Admin   etoposide (VEPESID) 220 mg in sodium chloride 0.9 % 1,000 mL chemo infusion  80 mg/m2 (Treatment Plan Recorded) Intravenous Once [name], MD       heparin lock flush 100 unit/mL  500 Units Intracatheter Once PRN [name], MD       pegfilgrastim (NEULASTA ONPRO KIT) injection 6 mg  6 mg Subcutaneous Once [name], MD       sodium chloride flush (NS) 0.9 % injection 10 mL  10 mL Intracatheter PRN [name], MD        REVIEW OF SYSTEMS:    10 Point review of Systems was done is negative except as noted above.  PHYSICAL EXAMINATION: ECOG PERFORMANCE STATUS: 1 - Symptomatic but completely ambulatory  . Vitals:   08/03/21 1114  BP: (!) 145/91  Pulse: 78  Resp: 18  Temp: 98.1 F (36.7 C)  SpO2: 100%    .Body mass index is 44.02 kg/m. NAD GENERAL:alert, in no acute distress and comfortable SKIN: no acute rashes, no significant lesions EYES: conjunctiva are pink and non-injected, sclera anicteric NECK: supple, no JVD LYMPH:  no palpable lymphadenopathy in the cervical, axillary or inguinal regions LUNGS: clear to auscultation b/l with normal respiratory effort HEART: regular rate & rhythm ABDOMEN:  normoactive bowel sounds , non tender, not distended. Extremity: no pedal  edema PSYCH: alert & oriented x 3 with fluent speech NEURO: no focal motor/sensory deficits  LABORATORY DATA:  I have reviewed the data as listed  .    Latest Ref Rng & Units 08/01/2021    8:33 AM 07/23/2021    7:53 AM 07/12/2021    5:43 AM  CBC  WBC 4.0 - 10.5 K/uL 6.5  10.7  7.3   Hemoglobin 13.0 - 17.0 g/dL 55.7  32.2  02.5   Hematocrit 39.0 - 52.0 % 36.4  37.6  40.2   Platelets 150 - 400 K/uL 344  414  281     .  Latest Ref Rng & Units 08/01/2021    8:33 AM 07/23/2021    7:53 AM 07/15/2021    6:18 AM  CMP  Glucose 70 - 99 mg/dL 540  981  94   BUN 6 - 20 mg/dL 8  8  7    Creatinine 0.61 - 1.24 mg/dL 1.91  4.78  2.95   Sodium 135 - 145 mmol/L 141  139  142   Potassium 3.5 - 5.1 mmol/L 4.2  3.8  3.6   Chloride 98 - 111 mmol/L 108  104  109   CO2 22 - 32 mmol/L 28  26  25    Calcium 8.9 - 10.3 mg/dL 9.3  62.1  9.0   Total Protein 6.5 - 8.1 g/dL 7.0  7.9  7.1   Total Bilirubin 0.3 - 1.2 mg/dL 1.0  1.7  3.1   Alkaline Phos 38 - 126 U/L 163  185  260   AST 15 - 41 U/L 113  51  170   ALT 0 - 44 U/L 209  135  601    Cytology done 07/13/2021 revealed "FINAL MICROSCOPIC DIAGNOSIS:  A. PANCREAS, HEAD, FINE NEEDLE ASPIRATION:  - Malignant cells present  - Poorly differentiated/high-grade neuroendocrine carcinoma (see  comment)   B. CBD STRICTURE, BRUSHING:  - Atypical cells suspicious for tumor   C. BILIARY DILATION, BALLOON, REMOVAL:  - Atypical cells suspicious for tumor "  Surgical pathology done 07/13/2021 revealed "FINAL MICROSCOPIC DIAGNOSIS:   A. STOMACH, BIOPSY:  Reactive gastropathy and minimal chronic gastritis with lymphoid  aggregate  Negative for H. pylori, intestinal metaplasia, dysplasia and carcinoma"  RADIOGRAPHIC STUDIES: I have personally reviewed the radiological images as listed and agreed with the findings in the report. IR IMAGING GUIDED PORT INSERTION  Result Date: 07/31/2021 INDICATION: Poorly differentiated neuroendocrine tumor of the  pancreas. Patient requires durable venous access for chemotherapy. EXAM: IMPLANTED PORT A CATH PLACEMENT WITH ULTRASOUND AND FLUOROSCOPIC GUIDANCE MEDICATIONS: None. ANESTHESIA/SEDATION: Versed 3 mg IV; Fentanyl 150 mcg IV; Moderate Sedation Time:  29 minutes The patient was continuously monitored during the procedure by the interventional radiology nurse under my direct supervision. FLUOROSCOPY: Radiation exposure index: 3 mGy reference air kerma COMPLICATIONS: None immediate. PROCEDURE: The right neck and chest was prepped with chlorhexidine, and draped in the usual sterile fashion using maximum barrier technique (cap and mask, sterile gown, sterile gloves, large sterile sheet, hand hygiene and cutaneous antiseptic). Local anesthesia was attained by infiltration with 1% lidocaine with epinephrine. Ultrasound demonstrated patency of the right internal jugular vein, and this was documented with an image. Under real-time ultrasound guidance, this vein was accessed with a 21 gauge micropuncture needle and image documentation was performed. A small dermatotomy was made at the access site with an 11 scalpel. A 0.018" wire was advanced into the SVC and the access needle exchanged for a 39F micropuncture vascular sheath. The 0.018" wire was then removed and a 0.035" wire advanced into the IVC. An appropriate location for the subcutaneous reservoir was selected below the clavicle and an incision was made through the skin and underlying soft tissues. The subcutaneous tissues were then dissected using a combination of blunt and sharp surgical technique and a pocket was formed. A single lumen power injectable portacatheter was then tunneled through the subcutaneous tissues from the pocket to the dermatotomy and the port reservoir placed within the subcutaneous pocket. The venous access site was then serially dilated and a peel away vascular sheath placed over the wire. The wire was  removed and the port catheter advanced into  position under fluoroscopic guidance. The catheter tip is positioned in the superior cavoatrial junction. This was documented with a spot image. The portacatheter was then tested and found to flush and aspirate well. The port was flushed with saline followed by 100 units/mL heparinized saline. The pocket was then closed in two layers using first subdermal inverted interrupted absorbable sutures followed by a running subcuticular suture. The epidermis was then sealed with Dermabond. The dermatotomy at the venous access site was also closed with Dermabond. IMPRESSION: Successful placement of a right IJ approach Power Port with ultrasound and fluoroscopic guidance. The catheter is ready for use. Electronically Signed   By: Malachy Moan M.D.   On: 07/31/2021 17:14   MR Brain W Wo Contrast  Result Date: 07/30/2021 CLINICAL DATA:  Initial staging for metastatic poorly differentiated neuroendocrine tumor EXAM: MRI HEAD WITHOUT AND WITH CONTRAST TECHNIQUE: Multiplanar, multiecho pulse sequences of the brain and surrounding structures were obtained without and with intravenous contrast. CONTRAST:  10mL GADAVIST GADOBUTROL 1 MMOL/ML IV SOLN COMPARISON:  CT head 01/14/2019. FINDINGS: Brain: No acute infarction, hemorrhage, hydrocephalus, extra-axial collection or mass lesion. No pathologic enhancement. Vascular: Major arterial flow voids are maintained at the skull base. Skull and upper cervical spine: Normal marrow signal. Sinuses/Orbits: Mild scattered ethmoid air cell opacification. No acute orbital findings. Other: No mastoid effusions. IMPRESSION: No evidence of acute intracranial abnormality or metastatic disease. Electronically Signed   By: Feliberto Harts M.D.   On: 07/30/2021 10:42   DG ERCP  Result Date: 07/13/2021 CLINICAL DATA:  Pancreatic mass, jaundice and abnormal liver function tests. EXAM: ERCP TECHNIQUE: Multiple spot images obtained with the fluoroscopic device and submitted for interpretation  post-procedure. COMPARISON:  CT of the abdomen on 07/11/2021 FINDINGS: Imaging during ERCP demonstrates evidence probable stricture of the distal common bile duct at the level of a known mass involving the pancreatic head. Balloon dilatation was performed followed by placement of an endoscopic biliary stent in the common bile duct. Contrast injection demonstrates mild prominence in caliber of the common bile duct above the level of stricture. IMPRESSION: Distal common bile duct stricture treated by balloon dilatation and placement of an endoscopic biliary stent. These images were submitted for radiologic interpretation only. Please see the procedural report for the amount of contrast and the fluoroscopy time utilized. Electronically Signed   By: Irish Lack M.D.   On: 07/13/2021 11:15   DG C-Arm 1-60 Min-No Report  Result Date: 07/13/2021 Fluoroscopy was utilized by the requesting physician.  No radiographic interpretation.   CT ABDOMEN PELVIS W CONTRAST  Result Date: 07/11/2021 CLINICAL DATA:  Bowel obstruction suspected. EXAM: CT ABDOMEN AND PELVIS WITH CONTRAST TECHNIQUE: Multidetector CT imaging of the abdomen and pelvis was performed using the standard protocol following bolus administration of intravenous contrast. RADIATION DOSE REDUCTION: This exam was performed according to the departmental dose-optimization program which includes automated exposure control, adjustment of the mA and/or kV according to patient size and/or use of iterative reconstruction technique. CONTRAST:  OMNIPAQUE IOHEXOL 300 MG/ML  SOLN COMPARISON:  CT and MRI Jul 02, 2021 FINDINGS: Lower chest: No acute abnormality. Hepatobiliary: No significant interval change in the subtle hypodense hepatic lesions measuring up to 1.3 cm in the right lobe of the liver on image 21/2. Gallbladder is unremarkable. Similar mild prominence of the biliary tree without dilation. Pancreas: No significant interval change in the ill-defined  hypoenhancing masslike area along the posterior pancreatic head which measures  approximally 2.7 cm on image 32/2. Similar mild pancreatic ductal dilation without significant pancreatic atrophy. Spleen: No splenomegaly or focal splenic lesion. Adrenals/Urinary Tract: Bilateral adrenal glands appear normal. No hydronephrosis. Kidneys demonstrate symmetric enhancement. No suspicious renal mass. Stomach/Bowel: Stomach is unremarkable for degree of distension. No pathologic dilation of small or large bowel. Terminal ileum appears normal. Normal appendix. Gas fluid levels throughout the colon suggestive of diarrheal illness. Vascular/Lymphatic: Normal caliber abdominal aorta. Enlarged retroperitoneal/upper abdominal lymph nodes are again seen an unchanged for instance an aortocaval lymph node measuring 15 mm in short axis on image 34/2. Reproductive: Prostate is unremarkable. Other: No significant abdominopelvic free fluid. Musculoskeletal: No aggressive lytic or blastic lesion of bone. IMPRESSION: 1. Gas fluid levels throughout the colon suggestive of diarrheal illness. No evidence of bowel obstruction. 2. No significant interval change in the hypodense/hypoenhancing masslike area in the pancreatic head with associated ductal dilation. 3. Stable enlarged upper abdominal lymph nodes and subtle hypodense hepatic lesions. Electronically Signed   By: Maudry Mayhew M.D.   On: 07/11/2021 13:03     ASSESSMENT & PLAN:   31 y.o. very pleasant male with  1. 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 -Cytology done 07/13/2021 shows presence of malignant cells in pancreas and poorly differentiated/high grade neuroendocrine carcinoma.  Plan -I discussed available labs with the patient in details Labs done today were reviewed in detail. CBC shows Hgb of 12.2, WBC count of 6.5k, and platelet count of 344k. CMP shows elevated counts as  follows: AST of 113, ALT of 209, ALP of 163, and total bilirubin has normalized at 1.0. -Patient's chart reviewed in detail -MRI brain w w/o contrast done 07/29/2021 revealed no evidence of metastatic disease and no other abnormalities. -He would like to reschedule cycle 3 treatment appointment to August 16th. -We discussed recent hormonal studies done 07/23/2021 which showed counts WNL. Functional hormonal secretion from tumor noted.  Follow up: -Please schedule for port flush labs and visit with NP Karena Addison in about 10 to 12 days for toxicity check and follow-up of PET scan -Please schedule cycle 2 of carboplatin etoposide chemotherapy with port flush and labs and Karena Addison NP on C2 day 1 of treatment -Please schedule cycle 3 of carboplatin etoposide chemotherapy with port flush labs and MD visit (Dr Candise Che) with cycle 3-day 1 (cycle 3-day 1 delayed by few days since patient's sister is getting married)  .The total time spent in the appointment was 25 minutes* .  All of the patient's questions were answered with apparent satisfaction. The patient knows to call the clinic with any problems, questions or concerns.   Wyvonnia Lora MD MS AAHIVMS [facility] Hematology/Oncology Physician [facility]  .*Total Encounter Time as defined by the Centers for Medicare and Medicaid Services includes, in addition to the face-to-face time of a patient visit (documented in the note above) non-face-to-face time: obtaining and reviewing outside history, ordering and reviewing medications, tests or procedures, care coordination (communications with other health care professionals or caregivers) and documentation in the medical record.  I, Bernestine Amass, am acting as scribe for Dr. Wyvonnia Lora, MD.  .I have reviewed the above documentation for accuracy and completeness, and I agree with the above. [name] MD

## 2021-08-03 NOTE — Patient Instructions (Signed)
[facility] MEDICAL ONCOLOGY  Discharge Instructions: Thank you for choosing [facility] to provide your oncology and hematology care.   If you have a lab appointment with the Cancer Center, please go directly to the Cancer Center and check in at the registration area.   Wear comfortable clothing and clothing appropriate for easy access to any Portacath or PICC line.   We strive to give you quality time with your provider. You may need to reschedule your appointment if you arrive late (15 or more minutes).  Arriving late affects you and other patients whose appointments are after yours.  Also, if you miss three or more appointments without notifying the office, you may be dismissed from the clinic at the provider's discretion.      For prescription refill requests, have your pharmacy contact our office and allow 72 hours for refills to be completed.    Today you received the following chemotherapy and/or immunotherapy agent: Etoposide      To help prevent nausea and vomiting after your treatment, we encourage you to take your nausea medication as directed.  BELOW ARE SYMPTOMS THAT SHOULD BE REPORTED IMMEDIATELY: *FEVER GREATER THAN 100.4 F (38 C) OR HIGHER *CHILLS OR SWEATING *NAUSEA AND VOMITING THAT IS NOT CONTROLLED WITH YOUR NAUSEA MEDICATION *UNUSUAL SHORTNESS OF BREATH *UNUSUAL BRUISING OR BLEEDING *URINARY PROBLEMS (pain or burning when urinating, or frequent urination) *BOWEL PROBLEMS (unusual diarrhea, constipation, pain near the anus) TENDERNESS IN MOUTH AND THROAT WITH OR WITHOUT PRESENCE OF ULCERS (sore throat, sores in mouth, or a toothache) UNUSUAL RASH, SWELLING OR PAIN  UNUSUAL VAGINAL DISCHARGE OR ITCHING   Items with * indicate a potential emergency and should be followed up as soon as possible or go to the Emergency Department if any problems should occur.  Please show the CHEMOTHERAPY ALERT CARD or IMMUNOTHERAPY ALERT CARD at check-in to  the Emergency Department and triage nurse.  Should you have questions after your visit or need to cancel or reschedule your appointment, please contact [facility]COL[facility]  Dept: [phone]  and follow the prompts.  Office hours are 8:00 a.m. to 4:30 p.m. Monday - Friday. Please note that voicemails left after 4:00 p.m. may not be returned until the following business day.  We are closed weekends and major holidays. You have access to a nurse at all times for urgent questions. Please call the main number to the clinic Dept: [phone] and follow the prompts.   For any non-urgent questions, you may also contact your provider using MyChart. We now offer e-Visits for anyone 18 and older to request care online for non-urgent symptoms. For details visit [email or web address].   Also download the MyChart app! Go to the app store, search "MyChart", open the app, select [facility], and log in with your MyChart username and password.  Masks are optional in the cancer centers. If you would like for your care team to wear a mask while they are taking care of you, please let them know. For doctor visits, patients may have with them one support person who is at least 31 years old. At this time, visitors are not allowed in the infusion area. 

## 2021-08-06 ENCOUNTER — Ambulatory Visit: Payer: 59

## 2021-08-09 ENCOUNTER — Encounter: Payer: Self-pay | Admitting: Hematology

## 2021-08-15 ENCOUNTER — Telehealth: Payer: Self-pay | Admitting: *Deleted

## 2021-08-15 NOTE — Telephone Encounter (Signed)
The [facility] request for medical records from 07/31/2021 to present faxed today to [facility]) [phone] to process.  Copy of completed Provider Attending Physician Statement previously returned 07/31/2021 and signed authorization included.  No further action performed or required by this nurse.

## 2021-08-17 NOTE — Telephone Encounter (Signed)
Noted ROI cancelled as invalid request.  Leaving information for other forms nurse as this nurse will be out of office.

## 2021-08-20 ENCOUNTER — Telehealth: Payer: Self-pay

## 2021-08-20 MED FILL — Dexamethasone Sodium Phosphate Inj 100 MG/10ML: INTRAMUSCULAR | Qty: 1 | Status: AC

## 2021-08-20 MED FILL — Fosaprepitant Dimeglumine For IV Infusion 150 MG (Base Eq): INTRAVENOUS | Qty: 5 | Status: AC

## 2021-08-20 NOTE — Telephone Encounter (Signed)
Notified Patient of request for records from [facility]. Records being requested are from 07/31/2021 through Present. Request for records sent to [facility] [employer] with signed Release of Information form with correct dates requested on the form. No other needs or concerns voiced at this time.

## 2021-08-21 ENCOUNTER — Inpatient Hospital Stay: Payer: 59 | Attending: Hematology

## 2021-08-21 ENCOUNTER — Other Ambulatory Visit: Payer: Self-pay

## 2021-08-21 ENCOUNTER — Inpatient Hospital Stay: Payer: 59 | Admitting: Physician Assistant

## 2021-08-21 ENCOUNTER — Inpatient Hospital Stay: Payer: 59

## 2021-08-21 VITALS — BP 131/82 | HR 80 | Temp 97.9°F | Resp 16

## 2021-08-21 VITALS — BP 135/95 | HR 82 | Temp 98.1°F | Resp 15 | Wt 322.6 lb

## 2021-08-21 DIAGNOSIS — C7A1 Malignant poorly differentiated neuroendocrine tumors: Secondary | ICD-10-CM | POA: Insufficient documentation

## 2021-08-21 DIAGNOSIS — Z79899 Other long term (current) drug therapy: Secondary | ICD-10-CM | POA: Insufficient documentation

## 2021-08-21 DIAGNOSIS — Z5189 Encounter for other specified aftercare: Secondary | ICD-10-CM | POA: Insufficient documentation

## 2021-08-21 DIAGNOSIS — Z5111 Encounter for antineoplastic chemotherapy: Secondary | ICD-10-CM | POA: Diagnosis present

## 2021-08-21 DIAGNOSIS — Z95828 Presence of other vascular implants and grafts: Secondary | ICD-10-CM

## 2021-08-21 DIAGNOSIS — Z7189 Other specified counseling: Secondary | ICD-10-CM

## 2021-08-21 LAB — CBC WITH DIFFERENTIAL (CANCER CENTER ONLY)
Abs Immature Granulocytes: 0.05 10*3/uL (ref 0.00–0.07)
Basophils Absolute: 0 10*3/uL (ref 0.0–0.1)
Basophils Relative: 1 %
Eosinophils Absolute: 0.1 10*3/uL (ref 0.0–0.5)
Eosinophils Relative: 1 %
HCT: 36.4 % — ABNORMAL LOW (ref 39.0–52.0)
Hemoglobin: 12.2 g/dL — ABNORMAL LOW (ref 13.0–17.0)
Immature Granulocytes: 1 %
Lymphocytes Relative: 24 %
Lymphs Abs: 1.7 10*3/uL (ref 0.7–4.0)
MCH: 29.6 pg (ref 26.0–34.0)
MCHC: 33.5 g/dL (ref 30.0–36.0)
MCV: 88.3 fL (ref 80.0–100.0)
Monocytes Absolute: 0.5 10*3/uL (ref 0.1–1.0)
Monocytes Relative: 7 %
Neutro Abs: 4.6 10*3/uL (ref 1.7–7.7)
Neutrophils Relative %: 66 %
Platelet Count: 421 10*3/uL — ABNORMAL HIGH (ref 150–400)
RBC: 4.12 MIL/uL — ABNORMAL LOW (ref 4.22–5.81)
RDW: 14.7 % (ref 11.5–15.5)
WBC Count: 7 10*3/uL (ref 4.0–10.5)
nRBC: 0 % (ref 0.0–0.2)

## 2021-08-21 LAB — CMP (CANCER CENTER ONLY)
ALT: 149 U/L — ABNORMAL HIGH (ref 0–44)
AST: 32 U/L (ref 15–41)
Albumin: 4.1 g/dL (ref 3.5–5.0)
Alkaline Phosphatase: 157 U/L — ABNORMAL HIGH (ref 38–126)
Anion gap: 7 (ref 5–15)
BUN: 17 mg/dL (ref 6–20)
CO2: 28 mmol/L (ref 22–32)
Calcium: 9.2 mg/dL (ref 8.9–10.3)
Chloride: 105 mmol/L (ref 98–111)
Creatinine: 0.83 mg/dL (ref 0.61–1.24)
GFR, Estimated: >60 mL/min (ref >60)
Glucose, Bld: 99 mg/dL (ref 70–99)
Potassium: 3.7 mmol/L (ref 3.5–5.1)
Sodium: 140 mmol/L (ref 135–145)
Total Bilirubin: 0.6 mg/dL (ref 0.3–1.2)
Total Protein: 7.1 g/dL (ref 6.5–8.1)

## 2021-08-21 MED ORDER — SODIUM CHLORIDE 0.9 % IV SOLN
750.0000 mg | Freq: Once | INTRAVENOUS | Status: AC
  Administered 2021-08-21: 750 mg via INTRAVENOUS
  Filled 2021-08-21: qty 75

## 2021-08-21 MED ORDER — SODIUM CHLORIDE 0.9 % IV SOLN
80.0000 mg/m2 | Freq: Once | INTRAVENOUS | Status: AC
  Administered 2021-08-21: 220 mg via INTRAVENOUS
  Filled 2021-08-21: qty 11

## 2021-08-21 MED ORDER — ACETAMINOPHEN 500 MG PO TABS
1000.0000 mg | ORAL_TABLET | Freq: Once | ORAL | Status: AC
  Administered 2021-08-21: 1000 mg via ORAL
  Filled 2021-08-21: qty 2

## 2021-08-21 MED ORDER — FAMOTIDINE IN NACL 20-0.9 MG/50ML-% IV SOLN
20.0000 mg | Freq: Once | INTRAVENOUS | Status: AC
  Administered 2021-08-21: 20 mg via INTRAVENOUS
  Filled 2021-08-21: qty 50

## 2021-08-21 MED ORDER — SODIUM CHLORIDE 0.9 % IV SOLN
150.0000 mg | Freq: Once | INTRAVENOUS | Status: AC
  Administered 2021-08-21: 150 mg via INTRAVENOUS
  Filled 2021-08-21: qty 150

## 2021-08-21 MED ORDER — SODIUM CHLORIDE 0.9% FLUSH
10.0000 mL | Freq: Once | INTRAVENOUS | Status: AC
  Administered 2021-08-21: 10 mL

## 2021-08-21 MED ORDER — PALONOSETRON HCL INJECTION 0.25 MG/5ML
0.2500 mg | Freq: Once | INTRAVENOUS | Status: AC
  Administered 2021-08-21: 0.25 mg via INTRAVENOUS
  Filled 2021-08-21: qty 5

## 2021-08-21 MED ORDER — SODIUM CHLORIDE 0.9 % IV SOLN: Freq: Once | INTRAVENOUS | Status: AC

## 2021-08-21 MED ORDER — SODIUM CHLORIDE 0.9 % IV SOLN
10.0000 mg | Freq: Once | INTRAVENOUS | Status: AC
  Administered 2021-08-21: 10 mg via INTRAVENOUS
  Filled 2021-08-21: qty 10

## 2021-08-21 MED ORDER — HEPARIN SOD (PORK) LOCK FLUSH 100 UNIT/ML IV SOLN
500.0000 [IU] | Freq: Once | INTRAVENOUS | Status: AC | PRN
  Administered 2021-08-21: 500 [IU]

## 2021-08-21 MED ORDER — SODIUM CHLORIDE 0.9% FLUSH
10.0000 mL | INTRAVENOUS | Status: DC | PRN
  Administered 2021-08-21: 10 mL

## 2021-08-21 MED FILL — Dexamethasone Sodium Phosphate Inj 100 MG/10ML: INTRAMUSCULAR | Qty: 1 | Status: AC

## 2021-08-21 NOTE — Progress Notes (Signed)
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 Is a 31 y.o. male returns today for a follow up for high grade neuroendocrine carcinoma prior to Cycle 2, Day 1 of carbo/etoposide chemotherapy. She is unaccompanied for this visit.   Mr. Bordelon reports that he tolerated his first cycle of chemotherapy without any significant limitations. He experienced fatigue for a couple of days following treatment that resolved on its own. He is able to complete his ADLs independently. He has a good appetite and weight has been stable. He experienced nausea for two days after chemotherapy which improved with antiemetics. He denies any vomiting episodes. His bowel habits are unchanged without any recurrent episodes of diarrhea or constipation. He denies easy bruising or signs of active bleeding. He denies fevers, chills, night sweats, shortness of breath, chest pain or cough. He has no other complaints.   MEDICAL HISTORY:  Past Medical History:  Diagnosis Date   Asthma     SURGICAL HISTORY: Past Surgical History:  Procedure Laterality Date   BILIARY DILATION  07/13/2021   Procedure: BILIARY DILATION;  Surgeon: Clarene Essex, MD;  Location: [facility];  Service: Gastroenterology;;   BILIARY STENT PLACEMENT N/A 07/13/2021   Procedure: BILIARY STENT PLACEMENT;  Surgeon: Clarene Essex, MD;  Location: [name];  Service: Gastroenterology;  Laterality: N/A;   BIOPSY  07/13/2021   Procedure: BIOPSY;  Surgeon: Rush Landmark Telford Nab., MD;  Location: [name];  Service: Gastroenterology;;   ENDOSCOPIC RETROGRADE CHOLANGIOPANCREATOGRAPHY (ERCP)  WITH PROPOFOL N/A 07/13/2021   Procedure: ENDOSCOPIC RETROGRADE CHOLANGIOPANCREATOGRAPHY (ERCP) WITH PROPOFOL;  Surgeon: Clarene Essex, MD;  Location: [name];  Service: Gastroenterology;  Laterality: N/A;   ESOPHAGOGASTRODUODENOSCOPY (EGD) WITH PROPOFOL N/A 07/13/2021   Procedure: ESOPHAGOGASTRODUODENOSCOPY (EGD) WITH PROPOFOL;  Surgeon: Rush Landmark Telford Nab., MD;  Location: [name];  Service: Gastroenterology;  Laterality: N/A;   EUS N/A 07/13/2021   Procedure: UPPER ENDOSCOPIC ULTRASOUND (EUS) LINEAR;  Surgeon: Irving Copas., MD;  Location: [name];  Service: Gastroenterology;  Laterality: N/A;   FINE NEEDLE ASPIRATION  07/13/2021   Procedure: FINE NEEDLE ASPIRATION (FNA) LINEAR;  Surgeon: Irving Copas., MD;  Location: [facility];  Service: Gastroenterology;;   IR IMAGING GUIDED PORT INSERTION  07/31/2021   SPHINCTEROTOMY  07/13/2021   Procedure: Joan Mayans;  Surgeon: Clarene Essex, MD;  Location: [name];  Service: Gastroenterology;;    SOCIAL HISTORY: Social History   Socioeconomic History   Marital status: Single    Spouse name: Not on file   Number of children: 0   Years of education: Not on file   Highest education level: Some college, no degree  Occupational History   Not on file  Tobacco Use   Smoking status: Some Days   Smokeless tobacco: Never  Vaping Use   Vaping Use: Never used  Substance and Sexual Activity   Alcohol use: Yes    Comment: social   Drug use: No   Sexual activity: Never  Other Topics Concern   Not on file  Social History Narrative   Not on file   Social Determinants of Health   Financial Resource Strain: Low Risk  (04/14/2017)   Overall Financial Resource Strain (CARDIA)    Difficulty of  Paying Living Expenses: Not hard at all  Food Insecurity: No Food Insecurity (04/14/2017)   Hunger Vital Sign    Worried About Running Out of Food in the Last Year: Never true    Ran Out of Food in the Last Year: Never true   Transportation Needs: No Transportation Needs (04/14/2017)   PRAPARE - Hydrologist (Medical): No    Lack of Transportation (Non-Medical): No  Physical Activity: Inactive (04/14/2017)   Exercise Vital Sign    Days of Exercise per Week: 0 days    Minutes of Exercise per Session: 0 min  Stress: Stress Concern Present (04/14/2017)   [place]    Feeling of Stress : Rather much  Social Connections: Moderately Isolated (04/14/2017)   Social Connection and Isolation Panel [NHANES]    Frequency of Communication with Friends and Family: More than three times a week    Frequency of Social Gatherings with Friends and Family: More than three times a week    Attends Religious Services: Never    Marine scientist or Organizations: No    Attends Archivist Meetings: Never    Marital Status: Never married  Intimate Partner Violence: Not At Risk (04/14/2017)   Humiliation, Afraid, Rape, and Kick questionnaire    Fear of Current or Ex-Partner: No    Emotionally Abused: No    Physically Abused: No    Sexually Abused: No    FAMILY HISTORY: Family History  Problem Relation Age of Onset   Drug abuse Father    ALLERGIES:  is allergic to shellfish allergy and other.  MEDICATIONS:  Current Outpatient Medications  Medication Sig Dispense Refill   albuterol (VENTOLIN HFA) 108 (90 Base) MCG/ACT inhaler Inhale 2 puffs into the lungs every 4 (four) hours as needed for wheezing or shortness of breath. 18 g 0   dexamethasone (DECADRON) 4 MG tablet Take 2 tablets (8 mg total) by mouth daily. Start the day after chemotherapy for 1 day. 30 tablet 1   lidocaine-prilocaine (EMLA) cream Apply to affected area once 30 g 3   LORazepam (ATIVAN) 0.5 MG tablet Take 1 tablet (0.5 mg total) by mouth every 6 (six) hours as needed (Nausea or vomiting). 30 tablet 0   ondansetron (ZOFRAN) 8 MG tablet Take 1 tablet (8  mg total) by mouth 2 (two) times daily as needed for refractory nausea / vomiting. Start on day 3 after carboplatin chemo. 30 tablet 1   oxyCODONE (OXY IR/ROXICODONE) 5 MG immediate release tablet Take 1 tablet (5 mg total) by mouth every 6 (six) hours as needed for moderate pain or breakthrough pain. 12 tablet 0   pantoprazole (PROTONIX) 40 MG tablet Take 1 tablet (40 mg total) by mouth daily. 30 tablet 1   polyethylene glycol (MIRALAX / GLYCOLAX) 17 g packet Take 17 g by mouth daily. 14 each 0   prochlorperazine (COMPAZINE) 10 MG tablet Take 1 tablet (10 mg total) by mouth every 6 (six) hours as needed (Nausea or vomiting). 30 tablet 1   senna-docusate (SENOKOT-S) 8.6-50 MG tablet Take 2 tablets by mouth 2 (two) times daily. 30 tablet 0   No current facility-administered medications for this visit.   Facility-Administered Medications Ordered in Other Visits  Medication Dose Route Frequency Provider Last Rate Last Admin   CARBOplatin (PARAPLATIN) 750 mg in sodium chloride 0.9 % 250 mL chemo infusion  750 mg Intravenous Once [name], MD  dexamethasone (DECADRON) 10 mg in sodium chloride 0.9 % 50 mL IVPB  10 mg Intravenous Once [name], MD       etoposide (VEPESID) 220 mg in sodium chloride 0.9 % 1,000 mL chemo infusion  80 mg/m2 (Treatment Plan Recorded) Intravenous Once [name], MD       famotidine (PEPCID) IVPB 20 mg premix  20 mg Intravenous Once [name], MD       fosaprepitant (EMEND) 150 mg in sodium chloride 0.9 % 145 mL IVPB  150 mg Intravenous Once [name], MD       heparin lock flush 100 unit/mL  500 Units Intracatheter Once PRN [name], MD       palonosetron (ALOXI) injection 0.25 mg  0.25 mg Intravenous Once [name], MD       sodium chloride flush (NS) 0.9 % injection 10 mL  10 mL Intracatheter PRN [name], MD        REVIEW OF SYSTEMS:    10 Point review of Systems was done  is negative except as noted above.  PHYSICAL EXAMINATION: ECOG PERFORMANCE STATUS: 1 - Symptomatic but completely ambulatory  . Vitals:   08/21/21 0815  BP: (!) 135/95  Pulse: 82  Resp: 15  Temp: 98.1 F (36.7 C)  SpO2: 100%    .Body mass index is 43.75 kg/m. NAD GENERAL:alert, in no acute distress and comfortable SKIN: no acute rashes, no significant lesions EYES: conjunctiva are pink and non-injected, sclera anicteric NECK: supple, no JVD LYMPH:  no palpable lymphadenopathy in the cervical or inguinal regions LUNGS: clear to auscultation b/l with normal respiratory effort HEART: regular rate & rhythm ABDOMEN:  normoactive bowel sounds , non tender, not distended. Extremity: no pedal edema PSYCH: alert & oriented x 3 with fluent speech NEURO: no focal motor/sensory deficits  LABORATORY DATA:  I have reviewed the data as listed  .    Latest Ref Rng & Units 08/21/2021    7:49 AM 08/01/2021    8:33 AM 07/23/2021    7:53 AM  CBC  WBC 4.0 - 10.5 K/uL 7.0  6.5  10.7   Hemoglobin 13.0 - 17.0 g/dL 12.2  12.2  12.8   Hematocrit 39.0 - 52.0 % 36.4  36.4  37.6   Platelets 150 - 400 K/uL 421  344  414     .    Latest Ref Rng & Units 08/21/2021    7:49 AM 08/01/2021    8:33 AM 07/23/2021    7:53 AM  CMP  Glucose 70 - 99 mg/dL 99  104  105   BUN 6 - 20 mg/dL '17  8  8   '$ Creatinine 0.61 - 1.24 mg/dL 0.83  0.70  0.91   Sodium 135 - 145 mmol/L 140  141  139   Potassium 3.5 - 5.1 mmol/L 3.7  4.2  3.8   Chloride 98 - 111 mmol/L 105  108  104   CO2 22 - 32 mmol/L '28  28  26   '$ Calcium 8.9 - 10.3 mg/dL 9.2  9.3  10.0   Total Protein 6.5 - 8.1 g/dL 7.1  7.0  7.9   Total Bilirubin 0.3 - 1.2 mg/dL 0.6  1.0  1.7   Alkaline Phos 38 - 126 U/L 157  163  185   AST 15 - 41 U/L 32  113  51   ALT 0 - 44 U/L 149  209  135  Cytology done 07/13/2021 revealed "FINAL MICROSCOPIC DIAGNOSIS:  A. PANCREAS, HEAD, FINE NEEDLE ASPIRATION:  - Malignant cells present  - Poorly  differentiated/high-grade neuroendocrine carcinoma (see  comment)   B. CBD STRICTURE, BRUSHING:  - Atypical cells suspicious for tumor   C. BILIARY DILATION, BALLOON, REMOVAL:  - Atypical cells suspicious for tumor "  Surgical pathology done 07/13/2021 revealed "FINAL MICROSCOPIC DIAGNOSIS:   A. STOMACH, BIOPSY:  Reactive gastropathy and minimal chronic gastritis with lymphoid  aggregate  Negative for H. pylori, intestinal metaplasia, dysplasia and carcinoma"  RADIOGRAPHIC STUDIES: I have personally reviewed the radiological images as listed and agreed with the findings in the report. 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 Patient requires durable venous access for chemotherapy. EXAM: IMPLANTED PORT A CATH PLACEMENT WITH ULTRASOUND AND FLUOROSCOPIC GUIDANCE MEDICATIONS: None. ANESTHESIA/SEDATION: Versed 3 mg IV; Fentanyl 150 mcg IV; Moderate Sedation Time:  29 minutes The patient was continuously monitored during the procedure by the interventional radiology nurse under my direct supervision. FLUOROSCOPY: Radiation exposure index: 3 mGy reference air kerma COMPLICATIONS: None immediate. PROCEDURE: The right neck and chest was prepped with chlorhexidine, and draped in the usual sterile fashion using maximum barrier technique (cap and mask, sterile gown, sterile gloves, large sterile sheet, hand hygiene and cutaneous antiseptic). Local anesthesia was attained by infiltration with 1% lidocaine with epinephrine. Ultrasound demonstrated patency of the right internal jugular vein, and this was documented with an image. Under real-time ultrasound guidance, this vein was accessed with a 21 gauge micropuncture needle and image documentation was performed. A small dermatotomy was made at the access site with an 11 scalpel. A 0.018" wire was advanced into the SVC and the access needle exchanged for a 47F micropuncture  vascular sheath. The 0.018" wire was then removed and a 0.035" wire advanced into the IVC. An appropriate location for the subcutaneous reservoir was selected below the clavicle and an incision was made through the skin and underlying soft tissues. The subcutaneous tissues were then dissected using a combination of blunt and sharp surgical technique and a pocket was formed. A single lumen power injectable portacatheter was then tunneled through the subcutaneous tissues from the pocket to the dermatotomy and the port reservoir placed within the subcutaneous pocket. The venous access site was then serially dilated and a peel away vascular sheath placed over the wire. The wire was removed and the port catheter advanced into position under fluoroscopic guidance. The catheter tip is positioned in the superior cavoatrial junction. This was documented with a spot image. The portacatheter was then tested and found to flush and aspirate well. The port was flushed with saline followed by 100 units/mL heparinized saline. The pocket was then closed in two layers using first subdermal inverted interrupted absorbable sutures followed by a running subcuticular suture. The epidermis was then sealed with Dermabond. The dermatotomy at the venous access site was also closed with Dermabond. IMPRESSION: Successful placement of a right IJ approach Power Port with ultrasound and fluoroscopic guidance. The catheter is ready for use. Electronically Signed   By: Jacqulynn Cadet M.D.   On: 07/31/2021 17:14   MR Brain W Wo Contrast  Result Date: 07/30/2021 CLINICAL DATA:  Initial staging for metastatic poorly differentiated neuroendocrine tumor EXAM: MRI HEAD WITHOUT AND WITH CONTRAST TECHNIQUE: Multiplanar, multiecho pulse sequences of the brain and surrounding structures were obtained without and with intravenous contrast. CONTRAST:  67m GADAVIST GADOBUTROL 1 MMOL/ML IV SOLN COMPARISON:  CT head 01/14/2019. FINDINGS: Brain: No acute  infarction, hemorrhage, hydrocephalus, extra-axial collection or mass lesion. No pathologic enhancement. Vascular: Major arterial flow voids are maintained at the skull base. Skull and upper cervical spine: Normal marrow signal. Sinuses/Orbits: Mild scattered ethmoid air cell opacification. No acute orbital findings. Other: No mastoid effusions. IMPRESSION: No evidence of acute intracranial abnormality or metastatic disease. Electronically Signed   By: Margaretha Sheffield M.D.   On: 07/30/2021 10:42     ASSESSMENT & PLAN:  Leon Fischer. Is a 31 y.o. male who presents for a follow up for high grade neuroendocrine carcinoma.   1. Poorly differentiated/high grade neuroendocrine carcinoma  -Likely Stage IV metastatic to loco regional LNs and likely liver mets. -Cytology done 07/13/2021 shows presence of malignant cells in pancreas and poorly differentiated/high grade neuroendocrine carcinoma. -Started systemic chemotherapy with carbo/etoposide on 08/01/2021.  Plan -Labs from today were reviewed and adequate for chemotherapy. WBC 7.0, Hgb 12.2, Plt 421K. CMP shows improving liver enzymes, AST normal at 32, ALT 149. Tbili normal.  -Proceed with Cycle 2 of carbo/etoposide starting today as scheduled. -PET scan has not been authorized by insurance. Plan to complete P2P.   Follow up: --RTC on 09/17/2021 for port labs and f/u visit with Dr. Jerelene Salaam Limbo before Cycle 3 of carbo/etoposide.   All of the patient's questions were answered with apparent satisfaction. The patient knows to call the clinic with any problems, questions or concerns.  I have spent a total of 30 minutes minutes of face-to-face and non-face-to-face time, preparing to see the patient, , performing a medically appropriate examination, counseling and educating the patient,documenting clinical information in the electronic health record, and care coordination.   Dede Query PA-C Dept of Hematology and [place] at [facility] Phone: [phone]

## 2021-08-21 NOTE — Progress Notes (Signed)
Ok to treat today with an ALT of 149 per Dede Query, PA-C.

## 2021-08-21 NOTE — Patient Instructions (Signed)
[facility]  Discharge Instructions: Thank you for choosing [facility] to provide your oncology and hematology care.   If you have a lab appointment with the [facility], please go directly to the [facility] and check in at the registration area.   Wear comfortable clothing and clothing appropriate for easy access to any Portacath or PICC line.   We strive to give you quality time with your provider. You may need to reschedule your appointment if you arrive late (15 or more minutes).  Arriving late affects you and other patients whose appointments are after yours.  Also, if you miss three or more appointments without notifying the office, you may be dismissed from the clinic at the provider's discretion.      For prescription refill requests, have your pharmacy contact our office and allow 72 hours for refills to be completed.    Today you received the following chemotherapy and/or immunotherapy agents: Carboplatin, Etoposide      To help prevent nausea and vomiting after your treatment, we encourage you to take your nausea medication as directed.  BELOW ARE SYMPTOMS THAT SHOULD BE REPORTED IMMEDIATELY: *FEVER GREATER THAN 100.4 F (38 C) OR HIGHER *CHILLS OR SWEATING *NAUSEA AND VOMITING THAT IS NOT CONTROLLED WITH YOUR NAUSEA MEDICATION *UNUSUAL SHORTNESS OF BREATH *UNUSUAL BRUISING OR BLEEDING *URINARY PROBLEMS (pain or burning when urinating, or frequent urination) *BOWEL PROBLEMS (unusual diarrhea, constipation, pain near the anus) TENDERNESS IN MOUTH AND THROAT WITH OR WITHOUT PRESENCE OF ULCERS (sore throat, sores in mouth, or a toothache) UNUSUAL RASH, SWELLING OR PAIN  UNUSUAL VAGINAL DISCHARGE OR ITCHING   Items with * indicate a potential emergency and should be followed up as soon as possible or go to the Emergency Department if any problems should occur.  Please show the CHEMOTHERAPY ALERT CARD or IMMUNOTHERAPY ALERT CARD at  check-in to the Emergency Department and triage nurse.  Should you have questions after your visit or need to cancel or reschedule your appointment, please contact [facility]  Dept: [phone]  and follow the prompts.  Office hours are 8:00 a.m. to 4:30 p.m. Monday - Friday. Please note that voicemails left after 4:00 p.m. may not be returned until the following business day.  We are closed weekends and major holidays. You have access to a nurse at all times for urgent questions. Please call the main number to the clinic Dept: [phone] and follow the prompts.   For any non-urgent questions, you may also contact your provider using MyChart. We now offer e-Visits for anyone 83 and older to request care online for non-urgent symptoms. For details visit [email or web address].   Also download the MyChart app! Go to the app store, search "MyChart", open the app, select [facility], and log in with your MyChart username and password.  Masks are optional in the cancer centers. If you would like for your care team to wear a mask while they are taking care of you, please let them know. For doctor visits, patients may have with them one support person who is at least 31 years old. At this time, visitors are not allowed in the infusion area. Carboplatin injection What is this medication? CARBOPLATIN (KAR boe pla tin) is a chemotherapy drug. It targets fast dividing cells, like cancer cells, and causes these cells to die. This medicine is used to treat ovarian cancer and many other cancers. This medicine may be used for other purposes; ask  your health care provider or pharmacist if you have questions. COMMON BRAND NAME(S): Paraplatin What should I tell my care team before I take this medication? They need to know if you have any of these conditions: blood disorders hearing problems kidney disease recent or ongoing radiation therapy an unusual or allergic reaction to  carboplatin, cisplatin, other chemotherapy, other medicines, foods, dyes, or preservatives pregnant or trying to get pregnant breast-feeding How should I use this medication? This drug is usually given as an infusion into a vein. It is administered in a hospital or clinic by a specially trained health care professional. Talk to your pediatrician regarding the use of this medicine in children. Special care may be needed. Overdosage: If you think you have taken too much of this medicine contact a poison control center or emergency room at once. NOTE: This medicine is only for you. Do not share this medicine with others. What if I miss a dose? It is important not to miss a dose. Call your doctor or health care professional if you are unable to keep an appointment. What may interact with this medication? medicines for seizures medicines to increase blood counts like filgrastim, pegfilgrastim, sargramostim some antibiotics like amikacin, gentamicin, neomycin, streptomycin, tobramycin vaccines Talk to your doctor or health care professional before taking any of these medicines: acetaminophen aspirin ibuprofen ketoprofen naproxen This list may not describe all possible interactions. Give your health care provider a list of all the medicines, herbs, non-prescription drugs, or dietary supplements you use. Also tell them if you smoke, drink alcohol, or use illegal drugs. Some items may interact with your medicine. What should I watch for while using this medication? Your condition will be monitored carefully while you are receiving this medicine. You will need important blood work done while you are taking this medicine. This drug may make you feel generally unwell. This is not uncommon, as chemotherapy can affect healthy cells as well as cancer cells. Report any side effects. Continue your course of treatment even though you feel ill unless your doctor tells you to stop. In some cases, you may be  given additional medicines to help with side effects. Follow all directions for their use. Call your doctor or health care professional for advice if you get a fever, chills or sore throat, or other symptoms of a cold or flu. Do not treat yourself. This drug decreases your body's ability to fight infections. Try to avoid being around people who are sick. This medicine may increase your risk to bruise or bleed. Call your doctor or health care professional if you notice any unusual bleeding. Be careful brushing and flossing your teeth or using a toothpick because you may get an infection or bleed more easily. If you have any dental work done, tell your dentist you are receiving this medicine. Avoid taking products that contain aspirin, acetaminophen, ibuprofen, naproxen, or ketoprofen unless instructed by your doctor. These medicines may hide a fever. Do not become pregnant while taking this medicine. Women should inform their doctor if they wish to become pregnant or think they might be pregnant. There is a potential for serious side effects to an unborn child. Talk to your health care professional or pharmacist for more information. Do not breast-feed an infant while taking this medicine. What side effects may I notice from receiving this medication? Side effects that you should report to your doctor or health care professional as soon as possible: allergic reactions like skin rash, itching or hives, swelling of  the face, lips, or tongue signs of infection - fever or chills, cough, sore throat, pain or difficulty passing urine signs of decreased platelets or bleeding - bruising, pinpoint red spots on the skin, black, tarry stools, nosebleeds signs of decreased red blood cells - unusually weak or tired, fainting spells, lightheadedness breathing problems changes in hearing changes in vision chest pain high blood pressure low blood counts - This drug may decrease the number of white blood cells, red  blood cells and platelets. You may be at increased risk for infections and bleeding. nausea and vomiting pain, swelling, redness or irritation at the injection site pain, tingling, numbness in the hands or feet problems with balance, talking, walking trouble passing urine or change in the amount of urine Side effects that usually do not require medical attention (report to your doctor or health care professional if they continue or are bothersome): hair loss loss of appetite metallic taste in the mouth or changes in taste This list may not describe all possible side effects. Call your doctor for medical advice about side effects. You may report side effects to FDA at 1-800-FDA-1088. Where should I keep my medication? This drug is given in a hospital or clinic and will not be stored at home. NOTE: This sheet is a summary. It may not cover all possible information. If you have questions about this medicine, talk to your doctor, pharmacist, or health care provider.  2023 Elsevier/Gold Standard (2007-07-01 00:00:00) Etoposide, VP-16 capsules What is this medication? ETOPOSIDE, VP-16 (e toe POE side) is a chemotherapy drug. It is used to treat small cell lung cancer and other cancers. This medicine may be used for other purposes; ask your health care provider or pharmacist if you have questions. COMMON BRAND NAME(S): VePesid What should I tell my care team before I take this medication? They need to know if you have any of these conditions: infection kidney disease liver disease low blood counts, like low white cell, platelet, or red cell counts an unusual or allergic reaction to etoposide, other medicines, foods, dyes, or preservatives pregnant or trying to get pregnant breast-feeding How should I use this medication? Take this medicine by mouth with a glass of water. Follow the directions on the prescription label. Do not open, crush, or chew the capsules. It is advisable to wear gloves  when handling this medicine. Take your medicine at regular intervals. Do not take it more often than directed. Do not stop taking except on your doctor's advice. Talk to your pediatrician regarding the use of this medicine in children. Special care may be needed. Overdosage: If you think you have taken too much of this medicine contact a poison control center or emergency room at once. NOTE: This medicine is only for you. Do not share this medicine with others. What if I miss a dose? If you miss a dose, take it as soon as you can. If it is almost time for your next dose, take only that dose. Do not take double or extra doses. What may interact with this medication? This medicine may interact with the following medications: cyclosporine warfarin This list may not describe all possible interactions. Give your health care provider a list of all the medicines, herbs, non-prescription drugs, or dietary supplements you use. Also tell them if you smoke, drink alcohol, or use illegal drugs. Some items may interact with your medicine. What should I watch for while using this medication? Visit your doctor for checks on your progress. This drug  may make you feel generally unwell. This is not uncommon, as chemotherapy can affect healthy cells as well as cancer cells. Report any side effects. Continue your course of treatment even though you feel ill unless your doctor tells you to stop. In some cases, you may be given additional medicines to help with side effects. Follow all directions for their use. Call your doctor or health care professional for advice if you get a fever, chills or sore throat, or other symptoms of a cold or flu. Do not treat yourself. This drug decreases your body's ability to fight infections. Try to avoid being around people who are sick. This medicine may increase your risk to bruise or bleed. Call your doctor or health care professional if you notice any unusual bleeding. Talk to your  doctor about your risk of cancer. You may be more at risk for certain types of cancers if you take this medicine. Do not become pregnant while taking this medicine or for at least 6 months after stopping it. Women should inform their doctor if they wish to become pregnant or think they might be pregnant. Women of child-bearing potential will need to have a negative pregnancy test before starting this medicine. There is a potential for serious side effects to an unborn child. Talk to your health care professional or pharmacist for more information. Do not breast-feed an infant while taking this medicine. Men must use a latex condom during sexual contact with a woman while taking this medicine and for at least 4 months after stopping it. A latex condom is needed even if you have had a vasectomy. Contact your doctor right away if your partner becomes pregnant. Do not donate sperm while taking this medicine and for 4 months after you stop taking this medicine. Men should inform their doctors if they wish to father a child. This medicine may lower sperm counts. What side effects may I notice from receiving this medication? Side effects that you should report to your doctor or health care professional as soon as possible: allergic reactions like skin rash, itching or hives, swelling of the face, lips, or tongue low blood counts - this medicine may decrease the number of white blood cells, red blood cells, and platelets. You may be at increased risk for infections and bleeding nausea, vomiting redness, blistering, peeling or loosening of the skin, including inside the mouth signs and symptoms of infection like fever; chills; cough; sore throat; pain or trouble passing urine signs and symptoms of low red blood cells or anemia such as unusually weak or tired; feeling faint or lightheaded; falls; breathing problems unusual bruising or bleeding Side effects that usually do not require medical attention (report to  your doctor or health care professional if they continue or are bothersome): changes in taste diarrhea hair loss loss of appetite mouth sores This list may not describe all possible side effects. Call your doctor for medical advice about side effects. You may report side effects to FDA at 1-800-FDA-1088. Where should I keep my medication? Keep out of the reach of children. Store in a refrigerator between 2 and 8 degrees C (36 and 46 degrees F). Do not freeze. Throw away any unused medicine after the expiration date. NOTE: This sheet is a summary. It may not cover all possible information. If you have questions about this medicine, talk to your doctor, pharmacist, or health care provider.  2023 Elsevier/Gold Standard (2020-12-22 00:00:00)

## 2021-08-22 ENCOUNTER — Inpatient Hospital Stay: Payer: 59

## 2021-08-22 VITALS — BP 127/80 | HR 71 | Temp 98.2°F | Resp 17

## 2021-08-22 DIAGNOSIS — Z5111 Encounter for antineoplastic chemotherapy: Secondary | ICD-10-CM | POA: Diagnosis not present

## 2021-08-22 DIAGNOSIS — Z7189 Other specified counseling: Secondary | ICD-10-CM

## 2021-08-22 DIAGNOSIS — C7A1 Malignant poorly differentiated neuroendocrine tumors: Secondary | ICD-10-CM

## 2021-08-22 MED ORDER — HEPARIN SOD (PORK) LOCK FLUSH 100 UNIT/ML IV SOLN
500.0000 [IU] | Freq: Once | INTRAVENOUS | Status: AC | PRN
  Administered 2021-08-22: 500 [IU]

## 2021-08-22 MED ORDER — SODIUM CHLORIDE 0.9 % IV SOLN
10.0000 mg | Freq: Once | INTRAVENOUS | Status: AC
  Administered 2021-08-22: 10 mg via INTRAVENOUS
  Filled 2021-08-22: qty 10

## 2021-08-22 MED ORDER — SODIUM CHLORIDE 0.9 % IV SOLN
80.0000 mg/m2 | Freq: Once | INTRAVENOUS | Status: AC
  Administered 2021-08-22: 220 mg via INTRAVENOUS
  Filled 2021-08-22: qty 11

## 2021-08-22 MED ORDER — SODIUM CHLORIDE 0.9 % IV SOLN: Freq: Once | INTRAVENOUS | Status: AC

## 2021-08-22 MED ORDER — SODIUM CHLORIDE 0.9% FLUSH
10.0000 mL | INTRAVENOUS | Status: DC | PRN
  Administered 2021-08-22: 10 mL

## 2021-08-22 MED FILL — Dexamethasone Sodium Phosphate Inj 100 MG/10ML: INTRAMUSCULAR | Qty: 1 | Status: AC

## 2021-08-22 NOTE — Patient Instructions (Signed)
[facility]  Discharge Instructions: Thank you for choosing [facility] to provide your oncology and hematology care.   If you have a lab appointment with the Cancer Center, please go directly to the Cancer Center and check in at the registration area.   Wear comfortable clothing and clothing appropriate for easy access to any Portacath or PICC line.   We strive to give you quality time with your provider. You may need to reschedule your appointment if you arrive late (15 or more minutes).  Arriving late affects you and other patients whose appointments are after yours.  Also, if you miss three or more appointments without notifying the office, you may be dismissed from the clinic at the provider's discretion.      For prescription refill requests, have your pharmacy contact our office and allow 72 hours for refills to be completed.    Today you received the following chemotherapy and/or immunotherapy agents: Etoposide      To help prevent nausea and vomiting after your treatment, we encourage you to take your nausea medication as directed.  BELOW ARE SYMPTOMS THAT SHOULD BE REPORTED IMMEDIATELY: *FEVER GREATER THAN 100.4 F (38 C) OR HIGHER *CHILLS OR SWEATING *NAUSEA AND VOMITING THAT IS NOT CONTROLLED WITH YOUR NAUSEA MEDICATION *UNUSUAL SHORTNESS OF BREATH *UNUSUAL BRUISING OR BLEEDING *URINARY PROBLEMS (pain or burning when urinating, or frequent urination) *BOWEL PROBLEMS (unusual diarrhea, constipation, pain near the anus) TENDERNESS IN MOUTH AND THROAT WITH OR WITHOUT PRESENCE OF ULCERS (sore throat, sores in mouth, or a toothache) UNUSUAL RASH, SWELLING OR PAIN  UNUSUAL VAGINAL DISCHARGE OR ITCHING   Items with * indicate a potential emergency and should be followed up as soon as possible or go to the Emergency Department if any problems should occur.  Please show the CHEMOTHERAPY ALERT CARD or IMMUNOTHERAPY ALERT CARD at check-in to  the Emergency Department and triage nurse.  Should you have questions after your visit or need to cancel or reschedule your appointment, please contact [facility]  Dept: [phone]  and follow the prompts.  Office hours are 8:00 a.m. to 4:30 p.m. Monday - Friday. Please note that voicemails left after 4:00 p.m. may not be returned until the following business day.  We are closed weekends and major holidays. You have access to a nurse at all times for urgent questions. Please call the main number to the clinic Dept: [phone] and follow the prompts.   For any non-urgent questions, you may also contact your provider using MyChart. We now offer e-Visits for anyone 18 and older to request care online for non-urgent symptoms. For details visit [email or web address].   Also download the MyChart app! Go to the app store, search "MyChart", open the app, select , and log in with your MyChart username and password.  Masks are optional in the cancer centers. If you would like for your care team to wear a mask while they are taking care of you, please let them know. For doctor visits, patients may have with them one support person who is at least 31 years old. At this time, visitors are not allowed in the infusion area. 

## 2021-08-23 ENCOUNTER — Other Ambulatory Visit: Payer: Self-pay

## 2021-08-23 ENCOUNTER — Inpatient Hospital Stay: Payer: 59

## 2021-08-23 VITALS — BP 154/92 | HR 73 | Temp 98.3°F | Resp 18

## 2021-08-23 DIAGNOSIS — C7A1 Malignant poorly differentiated neuroendocrine tumors: Secondary | ICD-10-CM

## 2021-08-23 DIAGNOSIS — Z7189 Other specified counseling: Secondary | ICD-10-CM

## 2021-08-23 DIAGNOSIS — Z5111 Encounter for antineoplastic chemotherapy: Secondary | ICD-10-CM | POA: Diagnosis not present

## 2021-08-23 MED ORDER — SODIUM CHLORIDE 0.9% FLUSH
10.0000 mL | INTRAVENOUS | Status: DC | PRN
  Administered 2021-08-23: 10 mL

## 2021-08-23 MED ORDER — PEGFILGRASTIM 6 MG/0.6ML ~~LOC~~ PSKT
6.0000 mg | PREFILLED_SYRINGE | Freq: Once | SUBCUTANEOUS | Status: AC
  Administered 2021-08-23: 6 mg via SUBCUTANEOUS
  Filled 2021-08-23: qty 0.6

## 2021-08-23 MED ORDER — SODIUM CHLORIDE 0.9 % IV SOLN: Freq: Once | INTRAVENOUS | Status: AC

## 2021-08-23 MED ORDER — SODIUM CHLORIDE 0.9 % IV SOLN
80.0000 mg/m2 | Freq: Once | INTRAVENOUS | Status: AC
  Administered 2021-08-23: 220 mg via INTRAVENOUS
  Filled 2021-08-23: qty 11

## 2021-08-23 MED ORDER — HEPARIN SOD (PORK) LOCK FLUSH 100 UNIT/ML IV SOLN
500.0000 [IU] | Freq: Once | INTRAVENOUS | Status: AC | PRN
  Administered 2021-08-23: 500 [IU]

## 2021-08-23 MED ORDER — SODIUM CHLORIDE 0.9 % IV SOLN
10.0000 mg | Freq: Once | INTRAVENOUS | Status: AC
  Administered 2021-08-23: 10 mg via INTRAVENOUS
  Filled 2021-08-23: qty 10

## 2021-08-23 NOTE — Patient Instructions (Signed)
[facility]  Discharge Instructions: Thank you for choosing [facility] to provide your oncology and hematology care.   If you have a lab appointment with the Cancer Center, please go directly to the Cancer Center and check in at the registration area.   Wear comfortable clothing and clothing appropriate for easy access to any Portacath or PICC line.   We strive to give you quality time with your provider. You may need to reschedule your appointment if you arrive late (15 or more minutes).  Arriving late affects you and other patients whose appointments are after yours.  Also, if you miss three or more appointments without notifying the office, you may be dismissed from the clinic at the provider's discretion.      For prescription refill requests, have your pharmacy contact our office and allow 72 hours for refills to be completed.    Today you received the following chemotherapy and/or immunotherapy agents: Etoposide      To help prevent nausea and vomiting after your treatment, we encourage you to take your nausea medication as directed.  BELOW ARE SYMPTOMS THAT SHOULD BE REPORTED IMMEDIATELY: *FEVER GREATER THAN 100.4 F (38 C) OR HIGHER *CHILLS OR SWEATING *NAUSEA AND VOMITING THAT IS NOT CONTROLLED WITH YOUR NAUSEA MEDICATION *UNUSUAL SHORTNESS OF BREATH *UNUSUAL BRUISING OR BLEEDING *URINARY PROBLEMS (pain or burning when urinating, or frequent urination) *BOWEL PROBLEMS (unusual diarrhea, constipation, pain near the anus) TENDERNESS IN MOUTH AND THROAT WITH OR WITHOUT PRESENCE OF ULCERS (sore throat, sores in mouth, or a toothache) UNUSUAL RASH, SWELLING OR PAIN  UNUSUAL VAGINAL DISCHARGE OR ITCHING   Items with * indicate a potential emergency and should be followed up as soon as possible or go to the Emergency Department if any problems should occur.  Please show the CHEMOTHERAPY ALERT CARD or IMMUNOTHERAPY ALERT CARD at check-in to  the Emergency Department and triage nurse.  Should you have questions after your visit or need to cancel or reschedule your appointment, please contact [facility]  Dept: [phone]  and follow the prompts.  Office hours are 8:00 a.m. to 4:30 p.m. Monday - Friday. Please note that voicemails left after 4:00 p.m. may not be returned until the following business day.  We are closed weekends and major holidays. You have access to a nurse at all times for urgent questions. Please call the main number to the clinic Dept: [phone] and follow the prompts.   For any non-urgent questions, you may also contact your provider using MyChart. We now offer e-Visits for anyone 18 and older to request care online for non-urgent symptoms. For details visit [email or web address].   Also download the MyChart app! Go to the app store, search "MyChart", open the app, select [facility], and log in with your MyChart username and password.  Masks are optional in the cancer centers. If you would like for your care team to wear a mask while they are taking care of you, please let them know. For doctor visits, patients may have with them one support Fatema Rabe who is at least 31 years old. At this time, visitors are not allowed in the infusion area. 

## 2021-08-27 ENCOUNTER — Other Ambulatory Visit: Payer: Self-pay

## 2021-09-11 ENCOUNTER — Emergency Department (HOSPITAL_COMMUNITY): Admission: EM | Admit: 2021-09-11 | Discharge: 2021-09-11 | Discharge status: Against Medical Advice | Payer: 59

## 2021-09-14 ENCOUNTER — Other Ambulatory Visit: Payer: Self-pay | Admitting: *Deleted

## 2021-09-14 DIAGNOSIS — C7A1 Malignant poorly differentiated neuroendocrine tumors: Secondary | ICD-10-CM

## 2021-09-17 ENCOUNTER — Inpatient Hospital Stay: Payer: 59 | Attending: Hematology | Admitting: Hematology

## 2021-09-17 ENCOUNTER — Inpatient Hospital Stay: Payer: 59

## 2021-09-17 ENCOUNTER — Other Ambulatory Visit: Payer: Self-pay | Admitting: Hematology

## 2021-09-17 ENCOUNTER — Other Ambulatory Visit: Payer: Self-pay

## 2021-09-17 VITALS — BP 141/94 | HR 88 | Temp 97.9°F | Resp 20 | Wt 319.8 lb

## 2021-09-17 VITALS — BP 124/84 | HR 79 | Resp 18

## 2021-09-17 DIAGNOSIS — Z7189 Other specified counseling: Secondary | ICD-10-CM

## 2021-09-17 DIAGNOSIS — Z95828 Presence of other vascular implants and grafts: Secondary | ICD-10-CM

## 2021-09-17 DIAGNOSIS — Z5111 Encounter for antineoplastic chemotherapy: Secondary | ICD-10-CM | POA: Diagnosis present

## 2021-09-17 DIAGNOSIS — Z5189 Encounter for other specified aftercare: Secondary | ICD-10-CM | POA: Insufficient documentation

## 2021-09-17 DIAGNOSIS — C7A1 Malignant poorly differentiated neuroendocrine tumors: Secondary | ICD-10-CM | POA: Insufficient documentation

## 2021-09-17 DIAGNOSIS — C7B8 Other secondary neuroendocrine tumors: Secondary | ICD-10-CM | POA: Insufficient documentation

## 2021-09-17 LAB — CBC WITH DIFFERENTIAL (CANCER CENTER ONLY)
Abs Immature Granulocytes: 0.04 10*3/uL (ref 0.00–0.07)
Basophils Absolute: 0 10*3/uL (ref 0.0–0.1)
Basophils Relative: 0 %
Eosinophils Absolute: 0.2 10*3/uL (ref 0.0–0.5)
Eosinophils Relative: 2 %
HCT: 34.8 % — ABNORMAL LOW (ref 39.0–52.0)
Hemoglobin: 12.1 g/dL — ABNORMAL LOW (ref 13.0–17.0)
Immature Granulocytes: 0 %
Lymphocytes Relative: 12 %
Lymphs Abs: 1.4 10*3/uL (ref 0.7–4.0)
MCH: 31.3 pg (ref 26.0–34.0)
MCHC: 34.8 g/dL (ref 30.0–36.0)
MCV: 90.2 fL (ref 80.0–100.0)
Monocytes Absolute: 1 10*3/uL (ref 0.1–1.0)
Monocytes Relative: 9 %
Neutro Abs: 8.5 10*3/uL — ABNORMAL HIGH (ref 1.7–7.7)
Neutrophils Relative %: 77 %
Platelet Count: 482 10*3/uL — ABNORMAL HIGH (ref 150–400)
RBC: 3.86 MIL/uL — ABNORMAL LOW (ref 4.22–5.81)
RDW: 14.9 % (ref 11.5–15.5)
WBC Count: 11.1 10*3/uL — ABNORMAL HIGH (ref 4.0–10.5)
nRBC: 0 % (ref 0.0–0.2)

## 2021-09-17 LAB — CMP (CANCER CENTER ONLY)
ALT: 173 U/L — ABNORMAL HIGH (ref 0–44)
AST: 30 U/L (ref 15–41)
Albumin: 4.4 g/dL (ref 3.5–5.0)
Alkaline Phosphatase: 234 U/L — ABNORMAL HIGH (ref 38–126)
Anion gap: 6 (ref 5–15)
BUN: 13 mg/dL (ref 6–20)
CO2: 28 mmol/L (ref 22–32)
Calcium: 9.4 mg/dL (ref 8.9–10.3)
Chloride: 105 mmol/L (ref 98–111)
Creatinine: 0.8 mg/dL (ref 0.61–1.24)
GFR, Estimated: >60 mL/min (ref >60)
Glucose, Bld: 116 mg/dL — ABNORMAL HIGH (ref 70–99)
Potassium: 3.7 mmol/L (ref 3.5–5.1)
Sodium: 139 mmol/L (ref 135–145)
Total Bilirubin: 0.8 mg/dL (ref 0.3–1.2)
Total Protein: 7.8 g/dL (ref 6.5–8.1)

## 2021-09-17 MED ORDER — ACETAMINOPHEN 500 MG PO TABS
1000.0000 mg | ORAL_TABLET | Freq: Once | ORAL | Status: AC
  Administered 2021-09-17: 1000 mg via ORAL

## 2021-09-17 MED ORDER — SODIUM CHLORIDE 0.9% FLUSH
10.0000 mL | INTRAVENOUS | Status: DC | PRN
  Administered 2021-09-17: 10 mL

## 2021-09-17 MED ORDER — SODIUM CHLORIDE 0.9 % IV SOLN
150.0000 mg | Freq: Once | INTRAVENOUS | Status: AC
  Administered 2021-09-17: 150 mg via INTRAVENOUS
  Filled 2021-09-17: qty 150

## 2021-09-17 MED ORDER — ALBUTEROL SULFATE HFA 108 (90 BASE) MCG/ACT IN AERS: 2.0000 | INHALATION_SPRAY | RESPIRATORY_TRACT | 0 refills | Status: DC | PRN

## 2021-09-17 MED ORDER — SODIUM CHLORIDE 0.9% FLUSH
10.0000 mL | Freq: Once | INTRAVENOUS | Status: AC
  Administered 2021-09-17: 10 mL

## 2021-09-17 MED ORDER — SODIUM CHLORIDE 0.9 % IV SOLN
10.0000 mg | Freq: Once | INTRAVENOUS | Status: AC
  Administered 2021-09-17: 10 mg via INTRAVENOUS
  Filled 2021-09-17: qty 10

## 2021-09-17 MED ORDER — HEPARIN SOD (PORK) LOCK FLUSH 100 UNIT/ML IV SOLN
500.0000 [IU] | Freq: Once | INTRAVENOUS | Status: AC | PRN
  Administered 2021-09-17: 500 [IU]

## 2021-09-17 MED ORDER — SODIUM CHLORIDE 0.9 % IV SOLN: Freq: Once | INTRAVENOUS | Status: AC

## 2021-09-17 MED ORDER — SODIUM CHLORIDE 0.9 % IV SOLN
80.0000 mg/m2 | Freq: Once | INTRAVENOUS | Status: AC
  Administered 2021-09-17: 220 mg via INTRAVENOUS
  Filled 2021-09-17: qty 11

## 2021-09-17 MED ORDER — FAMOTIDINE IN NACL 20-0.9 MG/50ML-% IV SOLN
20.0000 mg | Freq: Once | INTRAVENOUS | Status: AC
  Administered 2021-09-17: 20 mg via INTRAVENOUS

## 2021-09-17 MED ORDER — PALONOSETRON HCL INJECTION 0.25 MG/5ML
0.2500 mg | Freq: Once | INTRAVENOUS | Status: AC
  Administered 2021-09-17: 0.25 mg via INTRAVENOUS

## 2021-09-17 MED ORDER — DEXAMETHASONE 4 MG PO TABS: 8.0000 mg | ORAL_TABLET | Freq: Every day | ORAL | 1 refills | Status: DC

## 2021-09-17 MED ORDER — SODIUM CHLORIDE 0.9 % IV SOLN
750.0000 mg | Freq: Once | INTRAVENOUS | Status: AC
  Administered 2021-09-17: 750 mg via INTRAVENOUS
  Filled 2021-09-17: qty 75

## 2021-09-17 NOTE — Progress Notes (Deleted)
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  HISTORY OF PRESENTING ILLNESS:  Leon Foss. is a wonderful 31 y.o. male who has been referred to [place] by Dr Leon Essex MD for evaluation and management of poorly differentiated/high grade neuroendocrine carcinoma metastatic. He presents today accompanied by his mother. He reports He is doing well.  He reports no color change in stool.  He notes that he is eating well but feels food just sits in stomach.  We discussed getting additional scans and labs for further evaluation and treatment which he was agreeable to. We further discussed getting MRI of brain and PET scan which he was agreeable to.  He notes that he is trying to hold it together mentally and is still trying to process his new diagnosis. We discussed the options of psychological or spiritual counseling which he has not decided on at this time. He was advised that may call back if he decides that he would like to pursue either.  We discussed getting a port a cath installed which he was agreeable to.  He recently had a biliary stent put into common bile duct.   No abdominal pain or change in bowel habits. No new or unexpected weight loss. No other new or acute focal symptoms.  We discussed his recent stomach biopsy done 07/13/2021 which sowed gastropathy and minor chronic gastritis with lymphoid aggregate. Negative for H. Pylori.  We discussed recent cytology done 07/13/2021 which showed presence of malignant cells in pancreas and poorly differentiated/high grade neuroendocrine carcinoma.  We  discussed upper endoscopy done 07/13/2021 revealed mass in pancreatic head and some enlarged lymph nodes were seen in the celiac and peripancreatic regions.  Labs done today were reviewed in detail.  INTERVAL HISTORY:  Leon Duster. Is a 31 y.o. 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 He reports He is doing well with no new symptoms or concerns.  He reports he is tolerating the carboplatin etoposide chemotherapy well with no prohibitive toxicities at this time. We discussed increasing etoposide dosage to full dose with next cycle which he is agreeable to.  We will refill Albuterol and Dexamethasone. He notes that he uses his inhaler about 2x per week and says that he is sensitive to dust and other obstructive particles.  We discussed getting CT scan w contrast in 2 weeks for restaging and gauge response to treatment which he was agreeable to.  Good p.o intake. Eating well. No leg swelling. No new or unexpected weight loss. No infection issues. No mouth sores. No nausea or diarrhea. No other new or acute focal symptoms.  Labs done today were reviewed in detail.  MEDICAL HISTORY:  Past Medical History:  Diagnosis Date   Asthma     SURGICAL HISTORY: Past Surgical History:  Procedure Laterality Date   BILIARY DILATION  07/13/2021   Procedure: BILIARY DILATION;  Surgeon: Leon Essex, MD;  Location: [facility];  Service: Gastroenterology;;   BILIARY STENT PLACEMENT N/A 07/13/2021   Procedure: BILIARY STENT PLACEMENT;  Surgeon: Leon Essex, MD;  Location: [name];  Service: Gastroenterology;  Laterality: N/A;   BIOPSY  07/13/2021   Procedure: BIOPSY;  Surgeon: Irving Copas., MD;  Location: [facility];  Service: Gastroenterology;;   ENDOSCOPIC RETROGRADE CHOLANGIOPANCREATOGRAPHY (ERCP) WITH PROPOFOL N/A 07/13/2021   Procedure: ENDOSCOPIC RETROGRADE CHOLANGIOPANCREATOGRAPHY (ERCP) WITH  PROPOFOL;  Surgeon: Leon Essex, MD;  Location: [name];  Service: Gastroenterology;  Laterality: N/A;   ESOPHAGOGASTRODUODENOSCOPY (EGD) WITH PROPOFOL N/A 07/13/2021   Procedure: ESOPHAGOGASTRODUODENOSCOPY (EGD) WITH PROPOFOL;  Surgeon: Rush Landmark Telford Nab., MD;  Location: [name];  Service: Gastroenterology;  Laterality: N/A;   EUS N/A 07/13/2021   Procedure: UPPER ENDOSCOPIC ULTRASOUND (EUS) LINEAR;  Surgeon: Irving Copas., MD;  Location: [name];  Service: Gastroenterology;  Laterality: N/A;   FINE NEEDLE ASPIRATION  07/13/2021   Procedure: FINE NEEDLE ASPIRATION (FNA) LINEAR;  Surgeon: Irving Copas., MD;  Location: [facility];  Service: Gastroenterology;;   IR IMAGING GUIDED PORT INSERTION  07/31/2021   SPHINCTEROTOMY  07/13/2021   Procedure: Joan Mayans;  Surgeon: Leon Essex, MD;  Location: [name];  Service: Gastroenterology;;    SOCIAL HISTORY: Social History   Socioeconomic History   Marital status: Single    Spouse name: Not on file   Number of children: 0   Years of education: Not on file   Highest education level: Some college, no degree  Occupational History   Not on file  Tobacco Use   Smoking status: Some Days   Smokeless tobacco: Never  Vaping Use   Vaping Use: Never used  Substance and Sexual Activity   Alcohol use: Yes    Comment: social   Drug use: No   Sexual activity: Never  Other Topics Concern   Not on file  Social History Narrative   Not on file   Social Determinants of Health   Financial Resource Strain: Low Risk  (04/14/2017)   Overall Financial Resource Strain (CARDIA)    Difficulty of Paying Living Expenses: Not hard at all  Food Insecurity: No Food Insecurity (04/14/2017)   Hunger Vital Sign    Worried About Running Out of Food in the Last Year: Never true    Ran Out of Food in the Last Year: Never true  Transportation Needs: No Transportation Needs (04/14/2017)   PRAPARE - Radiographer, therapeutic (Medical): No    Lack of Transportation (Non-Medical): No  Physical Activity: Inactive (04/14/2017)   Exercise Vital Sign    Days of Exercise per Week: 0 days    Minutes of Exercise per Session: 0 min  Stress: Stress Concern Present (04/14/2017)   [place]    Feeling of Stress : Rather much  Social Connections: Moderately Isolated (04/14/2017)   Social Connection and Isolation Panel [NHANES]    Frequency of Communication with Friends and Family: More than three times a week    Frequency of Social Gatherings with Friends and Family: More than three times a week    Attends Religious Services: Never    Marine scientist or Organizations: No    Attends Archivist Meetings: Never    Marital Status: Never married  Intimate Partner Violence: Not At Risk (04/14/2017)   Humiliation, Afraid, Rape, and Kick questionnaire    Fear of Current or Ex-Partner: No    Emotionally Abused: No    Physically Abused: No    Sexually Abused: No    FAMILY HISTORY: Family History  Problem Relation Age of Onset   Drug abuse Father    ALLERGIES:  is allergic to shellfish allergy and other.  MEDICATIONS:  Current Outpatient Medications  Medication Sig Dispense Refill   albuterol (VENTOLIN HFA) 108 (90 Base) MCG/ACT inhaler Inhale 2 puffs into the lungs every 4 (four) hours as needed for wheezing or shortness of breath. 18 g 0   dexamethasone (DECADRON) 4 MG tablet Take 2 tablets (8 mg total) by mouth daily. Start the day after chemotherapy for 1 day. 30 tablet 1   lidocaine-prilocaine (EMLA) cream Apply to affected area once 30 g 3   LORazepam (ATIVAN) 0.5 MG tablet Take 1 tablet (0.5 mg total) by mouth every 6 (six) hours as needed (Nausea or vomiting). 30 tablet 0   ondansetron (ZOFRAN) 8 MG tablet Take 1 tablet (8 mg total) by mouth 2 (two) times daily as needed for refractory nausea / vomiting. Start on day 3  after carboplatin chemo. 30 tablet 1   oxyCODONE (OXY IR/ROXICODONE) 5 MG immediate release tablet Take 1 tablet (5 mg total) by mouth every 6 (six) hours as needed for moderate pain or breakthrough pain. 12 tablet 0   pantoprazole (PROTONIX) 40 MG tablet Take 1 tablet (40 mg total) by mouth daily. 30 tablet 1   polyethylene glycol (MIRALAX / GLYCOLAX) 17 g packet Take 17 g by mouth daily. 14 each 0   prochlorperazine (COMPAZINE) 10 MG tablet Take 1 tablet (10 mg total) by mouth every 6 (six) hours as needed (Nausea or vomiting). 30 tablet 1   senna-docusate (SENOKOT-S) 8.6-50 MG tablet Take 2 tablets by mouth 2 (two) times daily. 30 tablet 0   No current facility-administered medications for this visit.    REVIEW OF SYSTEMS:    10 Point review of Systems was done is negative except as noted above.  PHYSICAL EXAMINATION: ECOG PERFORMANCE STATUS: 1 - Symptomatic but completely ambulatory  . Vitals:   09/17/21 1045  BP: (!) 141/94  Pulse: 88  Resp: 20  Temp: 97.9 F (36.6 C)  SpO2: 98%     .Body mass index is 43.37 kg/m. NAD GENERAL:alert, in no acute distress and comfortable SKIN: no acute rashes, no significant lesions EYES: conjunctiva are pink and non-injected, sclera anicteric NECK: supple, no JVD LYMPH:  no palpable lymphadenopathy in the cervical, axillary or inguinal regions LUNGS: clear to auscultation b/l with normal respiratory effort HEART: regular rate & rhythm ABDOMEN:  normoactive bowel sounds , non tender, not distended. Extremity: no pedal edema PSYCH: alert & oriented x 3 with fluent speech NEURO: no focal motor/sensory deficits  LABORATORY DATA:  I have reviewed the data as listed  .    Latest Ref Rng & Units 09/17/2021    9:49 AM 08/21/2021    7:49 AM 08/01/2021    8:33 AM  CBC  WBC 4.0 - 10.5 K/uL 11.1  7.0  6.5   Hemoglobin 13.0 - 17.0 g/dL 12.1  12.2  12.2   Hematocrit 39.0 - 52.0 % 34.8  36.4  36.4   Platelets 150 - 400 K/uL 482  421  344      .    Latest Ref Rng & Units 09/17/2021    9:49 AM 08/21/2021    7:49 AM 08/01/2021    8:33 AM  CMP  Glucose 70 - 99 mg/dL 116  99  104   BUN 6 - 20 mg/dL '13  17  8   '$ Creatinine 0.61 - 1.24 mg/dL 0.80  0.83  0.70   Sodium 135 - 145 mmol/L 139  140  141   Potassium 3.5 - 5.1 mmol/L 3.7  3.7  4.2   Chloride 98 - 111 mmol/L 105  105  108   CO2 22 - 32 mmol/L '28  28  28   '$ Calcium 8.9 - 10.3 mg/dL 9.4  9.2  9.3   Total Protein 6.5 - 8.1 g/dL 7.8  7.1  7.0   Total Bilirubin 0.3 - 1.2 mg/dL 0.8  0.6  1.0   Alkaline Phos 38 - 126 U/L 234  157  163   AST 15 - 41 U/L 30  32  113   ALT 0 - 44 U/L 173  149  209    Cytology done 07/13/2021 revealed "FINAL MICROSCOPIC DIAGNOSIS:  A. PANCREAS, HEAD, FINE NEEDLE ASPIRATION:  - Malignant cells present  - Poorly differentiated/high-grade neuroendocrine carcinoma (see  comment)   B. CBD STRICTURE, BRUSHING:  - Atypical cells suspicious for tumor   C. BILIARY DILATION, BALLOON, REMOVAL:  - Atypical cells suspicious for tumor "  Surgical pathology done 07/13/2021 revealed "FINAL MICROSCOPIC DIAGNOSIS:   A. STOMACH, BIOPSY:  Reactive gastropathy and minimal chronic gastritis with lymphoid  aggregate  Negative for H. pylori, intestinal metaplasia, dysplasia and carcinoma"  [facility]ant 360 done 07/23/2021 revealed   RADIOGRAPHIC STUDIES: I have personally reviewed the radiological images as listed and agreed with the findings in the report. No results found.   ASSESSMENT & PLAN:   31 y.o. very pleasant male with  1. 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 -Cytology done 07/13/2021 shows presence of malignant cells in pancreas and poorly differentiated/high grade neuroendocrine carcinoma.  Plan -I discussed available labs with the patient in details Labs done today were reviewed in detail. CBC shows Hgb of 12.1, WBC count of 11.1k, and platelet  count of 482k. CMP shows elevated counts as follows: ALT of 173, and ALP of 234. -Patient's chart reviewed in detail -Refill Albuterol and Dexamethasone. Orders reviewed and signed. -Schedule CT scan w contrast in 2 weeks for restaging and gauge response to treatment. -We will consider increasing to full dose Etoposide with next cycle.   Follow up: CT  chest/abd/pelvis in 1 week Plz schedule next cycle of carboplatin etoposide chemotherapy.  Port flush and labs and MD visit on day 1 of each cycle of treatment  .The total time spent in the appointment was 32 minutes* .  All of the patient's questions were answered with apparent satisfaction. The patient knows to call the clinic with any problems, questions or concerns.   Sullivan Lone MD MS AAHIVMS [facility] Hematology/Oncology Physician [facility]  .*Total Encounter Time as defined by the Centers for Medicare and Medicaid Services includes, in addition to the face-to-face time of a patient visit (documented in the note above) non-face-to-face time: obtaining and reviewing outside history, ordering and reviewing medications, tests or procedures, care coordination (communications with other health care professionals or caregivers) and documentation in the medical record.  I, Melene Muller, am acting as scribe for Dr. Sullivan Lone, MD.  .I have reviewed the above documentation for accuracy and completeness, and I agree with the above. [name] MD

## 2021-09-17 NOTE — Progress Notes (Signed)
Per Dr. Irene Limbo, okay to treat with ALT 173

## 2021-09-17 NOTE — Patient Instructions (Signed)
[facility]  Discharge Instructions: Thank you for choosing [facility] to provide your oncology and hematology care.   If you have a lab appointment with the [facility], please go directly to the [facility] and check in at the registration area.   Wear comfortable clothing and clothing appropriate for easy access to any Portacath or PICC line.   We strive to give you quality time with your provider. You may need to reschedule your appointment if you arrive late (15 or more minutes).  Arriving late affects you and other patients whose appointments are after yours.  Also, if you miss three or more appointments without notifying the office, you may be dismissed from the clinic at the provider's discretion.      For prescription refill requests, have your pharmacy contact our office and allow 72 hours for refills to be completed.    Today you received the following chemotherapy and/or immunotherapy agents : Carboplatin & Etoposide      To help prevent nausea and vomiting after your treatment, we encourage you to take your nausea medication as directed.  BELOW ARE SYMPTOMS THAT SHOULD BE REPORTED IMMEDIATELY: *FEVER GREATER THAN 100.4 F (38 C) OR HIGHER *CHILLS OR SWEATING *NAUSEA AND VOMITING THAT IS NOT CONTROLLED WITH YOUR NAUSEA MEDICATION *UNUSUAL SHORTNESS OF BREATH *UNUSUAL BRUISING OR BLEEDING *URINARY PROBLEMS (pain or burning when urinating, or frequent urination) *BOWEL PROBLEMS (unusual diarrhea, constipation, pain near the anus) TENDERNESS IN MOUTH AND THROAT WITH OR WITHOUT PRESENCE OF ULCERS (sore throat, sores in mouth, or a toothache) UNUSUAL RASH, SWELLING OR PAIN  UNUSUAL VAGINAL DISCHARGE OR ITCHING   Items with * indicate a potential emergency and should be followed up as soon as possible or go to the Emergency Department if any problems should occur.  Please show the CHEMOTHERAPY ALERT CARD or IMMUNOTHERAPY ALERT CARD  at check-in to the Emergency Department and triage nurse.  Should you have questions after your visit or need to cancel or reschedule your appointment, please contact [facility]  Dept: [phone]  and follow the prompts.  Office hours are 8:00 a.m. to 4:30 p.m. Monday - Friday. Please note that voicemails left after 4:00 p.m. may not be returned until the following business day.  We are closed weekends and major holidays. You have access to a nurse at all times for urgent questions. Please call the main number to the clinic Dept: [phone] and follow the prompts.   For any non-urgent questions, you may also contact your provider using MyChart. We now offer e-Visits for anyone 20 and older to request care online for non-urgent symptoms. For details visit [email or web address].   Also download the MyChart app! Go to the app store, search "MyChart", open the app, select [facility], and log in with your MyChart username and password.  Masks are optional in the cancer centers. If you would like for your care team to wear a mask while they are taking care of you, please let them know. For doctor visits, patients may have with them one support Gearline Spilman who is at least 30 years old. At this time, visitors are not allowed in the infusion area.

## 2021-09-18 ENCOUNTER — Inpatient Hospital Stay: Payer: 59

## 2021-09-18 VITALS — BP 131/87 | HR 77 | Temp 98.2°F | Resp 19

## 2021-09-18 DIAGNOSIS — C7A1 Malignant poorly differentiated neuroendocrine tumors: Secondary | ICD-10-CM

## 2021-09-18 DIAGNOSIS — Z7189 Other specified counseling: Secondary | ICD-10-CM

## 2021-09-18 DIAGNOSIS — Z5111 Encounter for antineoplastic chemotherapy: Secondary | ICD-10-CM | POA: Diagnosis not present

## 2021-09-18 MED ORDER — SODIUM CHLORIDE 0.9 % IV SOLN
80.0000 mg/m2 | Freq: Once | INTRAVENOUS | Status: AC
  Administered 2021-09-18: 220 mg via INTRAVENOUS
  Filled 2021-09-18: qty 11

## 2021-09-18 MED ORDER — SODIUM CHLORIDE 0.9% FLUSH
10.0000 mL | INTRAVENOUS | Status: DC | PRN
  Administered 2021-09-18: 10 mL

## 2021-09-18 MED ORDER — HEPARIN SOD (PORK) LOCK FLUSH 100 UNIT/ML IV SOLN
500.0000 [IU] | Freq: Once | INTRAVENOUS | Status: AC | PRN
  Administered 2021-09-18: 500 [IU]

## 2021-09-18 MED ORDER — SODIUM CHLORIDE 0.9 % IV SOLN
10.0000 mg | Freq: Once | INTRAVENOUS | Status: AC
  Administered 2021-09-18: 10 mg via INTRAVENOUS
  Filled 2021-09-18: qty 10

## 2021-09-18 MED ORDER — SODIUM CHLORIDE 0.9 % IV SOLN: Freq: Once | INTRAVENOUS | Status: AC

## 2021-09-18 MED FILL — Dexamethasone Sodium Phosphate Inj 100 MG/10ML: INTRAMUSCULAR | Qty: 1 | Status: AC

## 2021-09-18 NOTE — Patient Instructions (Signed)
[facility]  Discharge Instructions: Thank you for choosing [facility] to provide your oncology and hematology care.   If you have a lab appointment with the Cancer Center, please go directly to the Cancer Center and check in at the registration area.   Wear comfortable clothing and clothing appropriate for easy access to any Portacath or PICC line.   We strive to give you quality time with your provider. You may need to reschedule your appointment if you arrive late (15 or more minutes).  Arriving late affects you and other patients whose appointments are after yours.  Also, if you miss three or more appointments without notifying the office, you may be dismissed from the clinic at the provider's discretion.      For prescription refill requests, have your pharmacy contact our office and allow 72 hours for refills to be completed.    Today you received the following chemotherapy and/or immunotherapy agents: Etoposide      To help prevent nausea and vomiting after your treatment, we encourage you to take your nausea medication as directed.  BELOW ARE SYMPTOMS THAT SHOULD BE REPORTED IMMEDIATELY: *FEVER GREATER THAN 100.4 F (38 C) OR HIGHER *CHILLS OR SWEATING *NAUSEA AND VOMITING THAT IS NOT CONTROLLED WITH YOUR NAUSEA MEDICATION *UNUSUAL SHORTNESS OF BREATH *UNUSUAL BRUISING OR BLEEDING *URINARY PROBLEMS (pain or burning when urinating, or frequent urination) *BOWEL PROBLEMS (unusual diarrhea, constipation, pain near the anus) TENDERNESS IN MOUTH AND THROAT WITH OR WITHOUT PRESENCE OF ULCERS (sore throat, sores in mouth, or a toothache) UNUSUAL RASH, SWELLING OR PAIN  UNUSUAL VAGINAL DISCHARGE OR ITCHING   Items with * indicate a potential emergency and should be followed up as soon as possible or go to the Emergency Department if any problems should occur.  Please show the CHEMOTHERAPY ALERT CARD or IMMUNOTHERAPY ALERT CARD at check-in to  the Emergency Department and triage nurse.  Should you have questions after your visit or need to cancel or reschedule your appointment, please contact [facility]  Dept: [phone]  and follow the prompts.  Office hours are 8:00 a.m. to 4:30 p.m. Monday - Friday. Please note that voicemails left after 4:00 p.m. may not be returned until the following business day.  We are closed weekends and major holidays. You have access to a nurse at all times for urgent questions. Please call the main number to the clinic Dept: [phone] and follow the prompts.   For any non-urgent questions, you may also contact your provider using MyChart. We now offer e-Visits for anyone 18 and older to request care online for non-urgent symptoms. For details visit [email or web address].   Also download the MyChart app! Go to the app store, search "MyChart", open the app, select [facility], and log in with your MyChart username and password.  Masks are optional in the cancer centers. If you would like for your care team to wear a mask while they are taking care of you, please let them know. You may have one support person who is at least 31 years old accompany you for your appointments. 

## 2021-09-19 ENCOUNTER — Inpatient Hospital Stay: Payer: 59

## 2021-09-19 ENCOUNTER — Other Ambulatory Visit: Payer: Self-pay

## 2021-09-19 VITALS — BP 121/80 | HR 77 | Temp 98.4°F | Resp 17

## 2021-09-19 DIAGNOSIS — Z5111 Encounter for antineoplastic chemotherapy: Secondary | ICD-10-CM | POA: Diagnosis not present

## 2021-09-19 DIAGNOSIS — Z7189 Other specified counseling: Secondary | ICD-10-CM

## 2021-09-19 DIAGNOSIS — C7A1 Malignant poorly differentiated neuroendocrine tumors: Secondary | ICD-10-CM

## 2021-09-19 MED ORDER — HEPARIN SOD (PORK) LOCK FLUSH 100 UNIT/ML IV SOLN
500.0000 [IU] | Freq: Once | INTRAVENOUS | Status: AC | PRN
  Administered 2021-09-19: 500 [IU]

## 2021-09-19 MED ORDER — SODIUM CHLORIDE 0.9 % IV SOLN
80.0000 mg/m2 | Freq: Once | INTRAVENOUS | Status: AC
  Administered 2021-09-19: 220 mg via INTRAVENOUS
  Filled 2021-09-19: qty 11

## 2021-09-19 MED ORDER — SODIUM CHLORIDE 0.9% FLUSH
10.0000 mL | INTRAVENOUS | Status: DC | PRN
  Administered 2021-09-19: 10 mL

## 2021-09-19 MED ORDER — SODIUM CHLORIDE 0.9 % IV SOLN: Freq: Once | INTRAVENOUS | Status: AC

## 2021-09-19 MED ORDER — PEGFILGRASTIM 6 MG/0.6ML ~~LOC~~ PSKT
6.0000 mg | PREFILLED_SYRINGE | Freq: Once | SUBCUTANEOUS | Status: AC
  Administered 2021-09-19: 6 mg via SUBCUTANEOUS

## 2021-09-19 MED ORDER — SODIUM CHLORIDE 0.9 % IV SOLN
10.0000 mg | Freq: Once | INTRAVENOUS | Status: AC
  Administered 2021-09-19: 10 mg via INTRAVENOUS
  Filled 2021-09-19: qty 10

## 2021-09-19 NOTE — Patient Instructions (Signed)
[facility]COL[facility]   Discharge Instructions: Thank you for choosing [facility] to provide your oncology and hematology care.   If you have a lab appointment with the Cancer Center, please go directly to the Cancer Center and check in at the registration area.   Wear comfortable clothing and clothing appropriate for easy access to any Portacath or PICC line.   We strive to give you quality time with your provider. You may need to reschedule your appointment if you arrive late (15 or more minutes).  Arriving late affects you and other patients whose appointments are after yours.  Also, if you miss three or more appointments without notifying the office, you may be dismissed from the clinic at the provider's discretion.      For prescription refill requests, have your pharmacy contact our office and allow 72 hours for refills to be completed.    Today you received the following chemotherapy and/or immunotherapy agents: etoposide      To help prevent nausea and vomiting after your treatment, we encourage you to take your nausea medication as directed.  BELOW ARE SYMPTOMS THAT SHOULD BE REPORTED IMMEDIATELY: *FEVER GREATER THAN 100.4 F (38 C) OR HIGHER *CHILLS OR SWEATING *NAUSEA AND VOMITING THAT IS NOT CONTROLLED WITH YOUR NAUSEA MEDICATION *UNUSUAL SHORTNESS OF BREATH *UNUSUAL BRUISING OR BLEEDING *URINARY PROBLEMS (pain or burning when urinating, or frequent urination) *BOWEL PROBLEMS (unusual diarrhea, constipation, pain near the anus) TENDERNESS IN MOUTH AND THROAT WITH OR WITHOUT PRESENCE OF ULCERS (sore throat, sores in mouth, or a toothache) UNUSUAL RASH, SWELLING OR PAIN  UNUSUAL VAGINAL DISCHARGE OR ITCHING   Items with * indicate a potential emergency and should be followed up as soon as possible or go to the Emergency Department if any problems should occur.  Please show the CHEMOTHERAPY ALERT CARD or IMMUNOTHERAPY ALERT CARD at check-in  to the Emergency Department and triage nurse.  Should you have questions after your visit or need to cancel or reschedule your appointment, please contact [facility]  Dept: [phone]  and follow the prompts.  Office hours are 8:00 a.m. to 4:30 p.m. Monday - Friday. Please note that voicemails left after 4:00 p.m. may not be returned until the following business day.  We are closed weekends and major holidays. You have access to a nurse at all times for urgent questions. Please call the main number to the clinic Dept: [phone] and follow the prompts.   For any non-urgent questions, you may also contact your provider using MyChart. We now offer e-Visits for anyone 18 and older to request care online for non-urgent symptoms. For details visit [email or web address].   Also download the MyChart app! Go to the app store, search "MyChart", open the app, select [facility], and log in with your MyChart username and password.  Masks are optional in the cancer centers. If you would like for your care team to wear a mask while they are taking care of you, please let them know. You may have one support person who is at least 31 years old accompany you for your appointments. 

## 2021-09-26 ENCOUNTER — Other Ambulatory Visit: Payer: Self-pay

## 2021-10-02 ENCOUNTER — Ambulatory Visit (HOSPITAL_COMMUNITY)
Admission: RE | Admit: 2021-10-02 | Discharge: 2021-10-02 | Disposition: A | Discharge status: Home / Self Care | Payer: 59 | Source: Ambulatory Visit | Attending: Hematology | Admitting: Hematology

## 2021-10-02 DIAGNOSIS — C7A1 Malignant poorly differentiated neuroendocrine tumors: Secondary | ICD-10-CM | POA: Diagnosis present

## 2021-10-02 MED ORDER — IOHEXOL 300 MG/ML  SOLN
100.0000 mL | Freq: Once | INTRAMUSCULAR | Status: AC | PRN
  Administered 2021-10-02: 100 mL via INTRAVENOUS

## 2021-10-09 ENCOUNTER — Inpatient Hospital Stay (HOSPITAL_BASED_OUTPATIENT_CLINIC_OR_DEPARTMENT_OTHER): Payer: 59 | Admitting: Hematology

## 2021-10-09 ENCOUNTER — Inpatient Hospital Stay: Payer: 59 | Attending: Hematology

## 2021-10-09 ENCOUNTER — Other Ambulatory Visit: Payer: Self-pay

## 2021-10-09 ENCOUNTER — Inpatient Hospital Stay: Payer: 59

## 2021-10-09 VITALS — BP 124/84 | HR 90

## 2021-10-09 VITALS — BP 137/95 | HR 103 | Temp 97.8°F | Wt 325.4 lb

## 2021-10-09 DIAGNOSIS — Z5111 Encounter for antineoplastic chemotherapy: Secondary | ICD-10-CM | POA: Diagnosis present

## 2021-10-09 DIAGNOSIS — C7A8 Other malignant neuroendocrine tumors: Secondary | ICD-10-CM | POA: Insufficient documentation

## 2021-10-09 DIAGNOSIS — C7B8 Other secondary neuroendocrine tumors: Secondary | ICD-10-CM | POA: Diagnosis not present

## 2021-10-09 DIAGNOSIS — Z5189 Encounter for other specified aftercare: Secondary | ICD-10-CM | POA: Insufficient documentation

## 2021-10-09 DIAGNOSIS — C7A1 Malignant poorly differentiated neuroendocrine tumors: Secondary | ICD-10-CM | POA: Diagnosis not present

## 2021-10-09 DIAGNOSIS — Z95828 Presence of other vascular implants and grafts: Secondary | ICD-10-CM

## 2021-10-09 DIAGNOSIS — Z7189 Other specified counseling: Secondary | ICD-10-CM

## 2021-10-09 LAB — CBC WITH DIFFERENTIAL (CANCER CENTER ONLY)
Abs Immature Granulocytes: 0.08 10*3/uL — ABNORMAL HIGH (ref 0.00–0.07)
Basophils Absolute: 0 10*3/uL (ref 0.0–0.1)
Basophils Relative: 0 %
Eosinophils Absolute: 0.3 10*3/uL (ref 0.0–0.5)
Eosinophils Relative: 3 %
HCT: 38.6 % — ABNORMAL LOW (ref 39.0–52.0)
Hemoglobin: 13.2 g/dL (ref 13.0–17.0)
Immature Granulocytes: 1 %
Lymphocytes Relative: 16 %
Lymphs Abs: 1.8 10*3/uL (ref 0.7–4.0)
MCH: 31.4 pg (ref 26.0–34.0)
MCHC: 34.2 g/dL (ref 30.0–36.0)
MCV: 91.7 fL (ref 80.0–100.0)
Monocytes Absolute: 0.8 10*3/uL (ref 0.1–1.0)
Monocytes Relative: 7 %
Neutro Abs: 8 10*3/uL — ABNORMAL HIGH (ref 1.7–7.7)
Neutrophils Relative %: 73 %
Platelet Count: 336 10*3/uL (ref 150–400)
RBC: 4.21 MIL/uL — ABNORMAL LOW (ref 4.22–5.81)
RDW: 15.1 % (ref 11.5–15.5)
WBC Count: 11 10*3/uL — ABNORMAL HIGH (ref 4.0–10.5)
nRBC: 0 % (ref 0.0–0.2)

## 2021-10-09 LAB — CMP (CANCER CENTER ONLY)
ALT: 42 U/L (ref 0–44)
AST: 20 U/L (ref 15–41)
Albumin: 4.4 g/dL (ref 3.5–5.0)
Alkaline Phosphatase: 130 U/L — ABNORMAL HIGH (ref 38–126)
Anion gap: 7 (ref 5–15)
BUN: 17 mg/dL (ref 6–20)
CO2: 27 mmol/L (ref 22–32)
Calcium: 9.4 mg/dL (ref 8.9–10.3)
Chloride: 104 mmol/L (ref 98–111)
Creatinine: 0.96 mg/dL (ref 0.61–1.24)
GFR, Estimated: >60 mL/min (ref >60)
Glucose, Bld: 108 mg/dL — ABNORMAL HIGH (ref 70–99)
Potassium: 3.7 mmol/L (ref 3.5–5.1)
Sodium: 138 mmol/L (ref 135–145)
Total Bilirubin: 0.4 mg/dL (ref 0.3–1.2)
Total Protein: 7.6 g/dL (ref 6.5–8.1)

## 2021-10-09 MED ORDER — FAMOTIDINE IN NACL 20-0.9 MG/50ML-% IV SOLN
20.0000 mg | Freq: Once | INTRAVENOUS | Status: AC
  Administered 2021-10-09: 20 mg via INTRAVENOUS
  Filled 2021-10-09: qty 50

## 2021-10-09 MED ORDER — PALONOSETRON HCL INJECTION 0.25 MG/5ML
0.2500 mg | Freq: Once | INTRAVENOUS | Status: AC
  Administered 2021-10-09: 0.25 mg via INTRAVENOUS
  Filled 2021-10-09: qty 5

## 2021-10-09 MED ORDER — SODIUM CHLORIDE 0.9 % IV SOLN
80.0000 mg/m2 | Freq: Once | INTRAVENOUS | Status: AC
  Administered 2021-10-09: 220 mg via INTRAVENOUS
  Filled 2021-10-09: qty 11

## 2021-10-09 MED ORDER — HEPARIN SOD (PORK) LOCK FLUSH 100 UNIT/ML IV SOLN
500.0000 [IU] | Freq: Once | INTRAVENOUS | Status: AC | PRN
  Administered 2021-10-09: 500 [IU]

## 2021-10-09 MED ORDER — SODIUM CHLORIDE 0.9 % IV SOLN
150.0000 mg | Freq: Once | INTRAVENOUS | Status: AC
  Administered 2021-10-09: 150 mg via INTRAVENOUS
  Filled 2021-10-09: qty 150

## 2021-10-09 MED ORDER — ACETAMINOPHEN 500 MG PO TABS
1000.0000 mg | ORAL_TABLET | Freq: Once | ORAL | Status: AC
  Administered 2021-10-09: 1000 mg via ORAL
  Filled 2021-10-09: qty 2

## 2021-10-09 MED ORDER — SODIUM CHLORIDE 0.9% FLUSH
10.0000 mL | Freq: Once | INTRAVENOUS | Status: AC
  Administered 2021-10-09: 10 mL

## 2021-10-09 MED ORDER — SODIUM CHLORIDE 0.9 % IV SOLN
10.0000 mg | Freq: Once | INTRAVENOUS | Status: AC
  Administered 2021-10-09: 10 mg via INTRAVENOUS
  Filled 2021-10-09: qty 10

## 2021-10-09 MED ORDER — SODIUM CHLORIDE 0.9 % IV SOLN
750.0000 mg | Freq: Once | INTRAVENOUS | Status: AC
  Administered 2021-10-09: 750 mg via INTRAVENOUS
  Filled 2021-10-09: qty 75

## 2021-10-09 MED ORDER — SODIUM CHLORIDE 0.9% FLUSH
10.0000 mL | INTRAVENOUS | Status: DC | PRN
  Administered 2021-10-09: 10 mL

## 2021-10-09 MED ORDER — SODIUM CHLORIDE 0.9 % IV SOLN: Freq: Once | INTRAVENOUS | Status: AC

## 2021-10-09 NOTE — Patient Instructions (Signed)
[facility] MEDICAL [facility]COL[facility]  Discharge Instructions: Thank you for choosing [facility] to provide your oncology and hematology care.   If you have a lab appointment with the Cancer Center, please go directly to the Cancer Center and check in at the registration area.   Wear comfortable clothing and clothing appropriate for easy access to any Portacath or PICC line.   We strive to give you quality time with your provider. You may need to reschedule your appointment if you arrive late (15 or more minutes).  Arriving late affects you and other patients whose appointments are after yours.  Also, if you miss three or more appointments without notifying the office, you may be dismissed from the clinic at the provider's discretion.      For prescription refill requests, have your pharmacy contact our office and allow 72 hours for refills to be completed.    Today you received the following chemotherapy and/or immunotherapy agents: Carboplatin, Etoposide.       To help prevent nausea and vomiting after your treatment, we encourage you to take your nausea medication as directed.  BELOW ARE SYMPTOMS THAT SHOULD BE REPORTED IMMEDIATELY: *FEVER GREATER THAN 100.4 F (38 C) OR HIGHER *CHILLS OR SWEATING *NAUSEA AND VOMITING THAT IS NOT CONTROLLED WITH YOUR NAUSEA MEDICATION *UNUSUAL SHORTNESS OF BREATH *UNUSUAL BRUISING OR BLEEDING *URINARY PROBLEMS (pain or burning when urinating, or frequent urination) *BOWEL PROBLEMS (unusual diarrhea, constipation, pain near the anus) TENDERNESS IN MOUTH AND THROAT WITH OR WITHOUT PRESENCE OF ULCERS (sore throat, sores in mouth, or a toothache) UNUSUAL RASH, SWELLING OR PAIN  UNUSUAL VAGINAL DISCHARGE OR ITCHING   Items with * indicate a potential emergency and should be followed up as soon as possible or go to the Emergency Department if any problems should occur.  Please show the CHEMOTHERAPY ALERT CARD or IMMUNOTHERAPY ALERT CARD  at check-in to the Emergency Department and triage nurse.  Should you have questions after your visit or need to cancel or reschedule your appointment, please contact [facility]  Dept: [phone]  and follow the prompts.  Office hours are 8:00 a.m. to 4:30 p.m. Monday - Friday. Please note that voicemails left after 4:00 p.m. may not be returned until the following business day.  We are closed weekends and major holidays. You have access to a nurse at all times for urgent questions. Please call the main number to the clinic Dept: [phone] and follow the prompts.   For any non-urgent questions, you may also contact your provider using MyChart. We now offer e-Visits for anyone 18 and older to request care online for non-urgent symptoms. For details visit [email or web address].   Also download the MyChart app! Go to the app store, search "MyChart", open the app, select [facility], and log in with your MyChart username and password.  Masks are optional in the cancer centers. If you would like for your care team to wear a mask while they are taking care of you, please let them know. You may have one support person who is at least 31 years old accompany you for your appointments. Carboplatin Injection What is this medication? CARBOPLATIN (KAR boe pla tin) treats some types of cancer. It works by slowing down the growth of cancer cells. This medicine may be used for other purposes; ask your health care provider or pharmacist if you have questions. COMMON BRAND NAME(S): Paraplatin What should I tell my care team before I take this   medication? They need to know if you have any of these conditions: Blood disorders Hearing problems Kidney disease Recent or ongoing radiation therapy An unusual or allergic reaction to carboplatin, cisplatin, other medications, foods, dyes, or preservatives Pregnant or trying to get pregnant Breast-feeding How should I use this  medication? This medication is injected into a vein. It is given by your care team in a hospital or clinic setting. Talk to your care team about the use of this medication in children. Special care may be needed. Overdosage: If you think you have taken too much of this medicine contact a poison control center or emergency room at once. NOTE: This medicine is only for you. Do not share this medicine with others. What if I miss a dose? Keep appointments for follow-up doses. It is important not to miss your dose. Call your care team if you are unable to keep an appointment. What may interact with this medication? Medications for seizures Some antibiotics, such as amikacin, gentamicin, neomycin, streptomycin, tobramycin Vaccines This list may not describe all possible interactions. Give your health care provider a list of all the medicines, herbs, non-prescription drugs, or dietary supplements you use. Also tell them if you smoke, drink alcohol, or use illegal drugs. Some items may interact with your medicine. What should I watch for while using this medication? Your condition will be monitored carefully while you are receiving this medication. You may need blood work while taking this medication. This medication may make you feel generally unwell. This is not uncommon, as chemotherapy can affect healthy cells as well as cancer cells. Report any side effects. Continue your course of treatment even though you feel ill unless your care team tells you to stop. In some cases, you may be given additional medications to help with side effects. Follow all directions for their use. This medication may increase your risk of getting an infection. Call your care team for advice if you get a fever, chills, sore throat, or other symptoms of a cold or flu. Do not treat yourself. Try to avoid being around people who are sick. Avoid taking medications that contain aspirin, acetaminophen, ibuprofen, naproxen, or  ketoprofen unless instructed by your care team. These medications may hide a fever. Be careful brushing or flossing your teeth or using a toothpick because you may get an infection or bleed more easily. If you have any dental work done, tell your dentist you are receiving this medication. Talk to your care team if you wish to become pregnant or think you might be pregnant. This medication can cause serious birth defects. Talk to your care team about effective forms of contraception. Do not breast-feed while taking this medication. What side effects may I notice from receiving this medication? Side effects that you should report to your care team as soon as possible: Allergic reactions--skin rash, itching, hives, swelling of the face, lips, tongue, or throat Infection--fever, chills, cough, sore throat, wounds that don't heal, pain or trouble when passing urine, general feeling of discomfort or being unwell Low red blood cell level--unusual weakness or fatigue, dizziness, headache, trouble breathing Pain, tingling, or numbness in the hands or feet, muscle weakness, change in vision, confusion or trouble speaking, loss of balance or coordination, trouble walking, seizures Unusual bruising or bleeding Side effects that usually do not require medical attention (report to your care team if they continue or are bothersome): Hair loss Nausea Unusual weakness or fatigue Vomiting This list may not describe all possible side effects.   Call your doctor for medical advice about side effects. You may report side effects to FDA at 1-800-FDA-1088. Where should I keep my medication? This medication is given in a hospital or clinic. It will not be stored at home. NOTE: This sheet is a summary. It may not cover all possible information. If you have questions about this medicine, talk to your doctor, pharmacist, or health care provider.  2023 Elsevier/Gold Standard (2021-05-15 00:00:00) Etoposide Injection What  is this medication? ETOPOSIDE (e toe POE side) treats some types of cancer. It works by slowing down the growth of cancer cells. This medicine may be used for other purposes; ask your health care provider or pharmacist if you have questions. COMMON BRAND NAME(S): Etopophos, Toposar, VePesid What should I tell my care team before I take this medication? They need to know if you have any of these conditions: Infection Kidney disease Liver disease Low blood counts, such as low white cell, platelet, red cell counts An unusual or allergic reaction to etoposide, other medications, foods, dyes, or preservatives If you or your partner are pregnant or trying to get pregnant Breastfeeding How should I use this medication? This medication is injected into a vein. It is given by your care team in a hospital or clinic setting. Talk to your care team about the use of this medication in children. Special care may be needed. Overdosage: If you think you have taken too much of this medicine contact a poison control center or emergency room at once. NOTE: This medicine is only for you. Do not share this medicine with others. What if I miss a dose? Keep appointments for follow-up doses. It is important not to miss your dose. Call your care team if you are unable to keep an appointment. What may interact with this medication? Warfarin This list may not describe all possible interactions. Give your health care provider a list of all the medicines, herbs, non-prescription drugs, or dietary supplements you use. Also tell them if you smoke, drink alcohol, or use illegal drugs. Some items may interact with your medicine. What should I watch for while using this medication? Your condition will be monitored carefully while you are receiving this medication. This medication may make you feel generally unwell. This is not uncommon as chemotherapy can affect healthy cells as well as cancer cells. Report any side effects.  Continue your course of treatment even though you feel ill unless your care team tells you to stop. This medication can cause serious side effects. To reduce the risk, your care team may give you other medications to take before receiving this one. Be sure to follow the directions from your care team. This medication may increase your risk of getting an infection. Call your care team for advice if you get a fever, chills, sore throat, or other symptoms of a cold or flu. Do not treat yourself. Try to avoid being around people who are sick. This medication may increase your risk to bruise or bleed. Call your care team if you notice any unusual bleeding. Talk to your care team about your risk of cancer. You may be more at risk for certain types of cancers if you take this medication. Talk to your care team if you may be pregnant. Serious birth defects can occur if you take this medication during pregnancy and for 6 months after the last dose. You will need a negative pregnancy test before starting this medication. Contraception is recommended while taking this medication and for   6 months after the last dose. Your care team can help you find the option that works for you. If your partner can get pregnant, use a condom during sex while taking this medication and for 4 months after the last dose. Do not breastfeed while taking this medication. This medication may cause infertility. Talk to your care team if you are concerned about your fertility. What side effects may I notice from receiving this medication? Side effects that you should report to your care team as soon as possible: Allergic reactions--skin rash, itching, hives, swelling of the face, lips, tongue, or throat Infection--fever, chills, cough, sore throat, wounds that don't heal, pain or trouble when passing urine, general feeling of discomfort or being unwell Low red blood cell level--unusual weakness or fatigue, dizziness, headache, trouble  breathing Unusual bruising or bleeding Side effects that usually do not require medical attention (report to your care team if they continue or are bothersome): Diarrhea Fatigue Hair loss Loss of appetite Nausea Vomiting This list may not describe all possible side effects. Call your doctor for medical advice about side effects. You may report side effects to FDA at 1-800-FDA-1088. Where should I keep my medication? This medication is given in a hospital or clinic. It will not be stored at home. NOTE: This sheet is a summary. It may not cover all possible information. If you have questions about this medicine, talk to your doctor, pharmacist, or health care provider.  2023 Elsevier/Gold Standard (2021-06-14 00:00:00)  

## 2021-10-09 NOTE — Progress Notes (Signed)
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  HISTORY OF PRESENTING ILLNESS:  Leon Maggio. is a wonderful 31 y.o. 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 He presents today accompanied by his mother. He reports He is doing well.  He reports no color change in stool.  He notes that he is eating well but feels food just sits in stomach.  We discussed getting additional scans and labs for further evaluation and treatment which he was agreeable to. We further discussed getting MRI of brain and PET scan which he was agreeable to.  He notes that he is trying to hold it together mentally and is still trying to process his new diagnosis. We discussed the options of psychological or spiritual counseling which he has not decided on at this time. He was advised that may call back if he decides that he would like to pursue either.  We discussed getting a port a cath installed which he was agreeable to.  He recently had a biliary stent put into common bile duct.   No abdominal pain or change in bowel habits. No new or unexpected weight loss. No other new or acute focal symptoms.  We discussed his recent stomach biopsy done 07/13/2021 which sowed gastropathy and minor chronic gastritis with lymphoid aggregate. Negative for H. Pylori.  We discussed recent cytology done 07/13/2021 which showed presence of malignant cells in pancreas and poorly differentiated/high grade neuroendocrine carcinoma.  We  discussed upper endoscopy done 07/13/2021 revealed mass in pancreatic head and some enlarged lymph nodes were seen in the celiac and peripancreatic regions.  Labs done today were reviewed in detail.  INTERVAL HISTORY:  Leon Duster. Is a 31 y.o. 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 He reports He is doing well with no new symptoms or concerns.  He reports some grade 1 fatigue following treatment.  He reports he is tolerating the carboplatin etoposide chemotherapy well with no prohibitive toxicities at this time.  No leg swelling. No new or unexpected weight loss. No abdominal pain or distension. No new neuropathy. No other new or acute focal symptoms.  Labs done 08/01/2021 were reviewed in detail.  We discussed his CT chest, abd, pelvis done 10/02/2021.  MEDICAL HISTORY:  Past Medical History:  Diagnosis Date   Asthma     SURGICAL HISTORY: Past Surgical History:  Procedure Laterality Date   BILIARY DILATION  07/13/2021   Procedure: BILIARY DILATION;  Surgeon: Clarene Essex, MD;  Location: [facility];  Service: Gastroenterology;;   BILIARY STENT PLACEMENT N/A 07/13/2021   Procedure: BILIARY STENT PLACEMENT;  Surgeon: Clarene Essex, MD;  Location: [name];  Service: Gastroenterology;  Laterality: N/A;   BIOPSY  07/13/2021   Procedure: BIOPSY;  Surgeon: Rush Landmark Telford Nab., MD;  Location: [name];  Service: Gastroenterology;;   ENDOSCOPIC RETROGRADE CHOLANGIOPANCREATOGRAPHY (ERCP) WITH PROPOFOL N/A 07/13/2021   Procedure: ENDOSCOPIC RETROGRADE CHOLANGIOPANCREATOGRAPHY (ERCP) WITH PROPOFOL;  Surgeon: Clarene Essex, MD;  Location: [name];  Service: Gastroenterology;  Laterality: N/A;   ESOPHAGOGASTRODUODENOSCOPY (EGD) WITH PROPOFOL N/A 07/13/2021   Procedure:  ESOPHAGOGASTRODUODENOSCOPY (EGD) WITH PROPOFOL;  Surgeon: Mansouraty, Telford Nab., MD;  Location: [facility];  Service:  Gastroenterology;  Laterality: N/A;   EUS N/A 07/13/2021   Procedure: UPPER ENDOSCOPIC ULTRASOUND (EUS) LINEAR;  Surgeon: Irving Copas., MD;  Location: [name];  Service: Gastroenterology;  Laterality: N/A;   FINE NEEDLE ASPIRATION  07/13/2021   Procedure: FINE NEEDLE ASPIRATION (FNA) LINEAR;  Surgeon: Irving Copas., MD;  Location: [facility];  Service: Gastroenterology;;   IR IMAGING GUIDED PORT INSERTION  07/31/2021   SPHINCTEROTOMY  07/13/2021   Procedure: Joan Mayans;  Surgeon: Clarene Essex, MD;  Location: [name];  Service: Gastroenterology;;    SOCIAL HISTORY: Social History   Socioeconomic History   Marital status: Single    Spouse name: Not on file   Number of children: 0   Years of education: Not on file   Highest education level: Some college, no degree  Occupational History   Not on file  Tobacco Use   Smoking status: Some Days   Smokeless tobacco: Never  Vaping Use   Vaping Use: Never used  Substance and Sexual Activity   Alcohol use: Yes    Comment: social   Drug use: No   Sexual activity: Never  Other Topics Concern   Not on file  Social History Narrative   Not on file   Social Determinants of Health   Financial Resource Strain: Low Risk  (04/14/2017)   Overall Financial Resource Strain (CARDIA)    Difficulty of Paying Living Expenses: Not hard at all  Food Insecurity: No Food Insecurity (04/14/2017)   Hunger Vital Sign    Worried About Running Out of Food in the Last Year: Never true    Ran Out of Food in the Last Year: Never true  Transportation Needs: No Transportation Needs (04/14/2017)   PRAPARE - Hydrologist (Medical): No    Lack of Transportation (Non-Medical): No  Physical Activity: Inactive (04/14/2017)   Exercise Vital Sign    Days of Exercise per Week: 0 days    Minutes of Exercise per Session: 0 min  Stress: Stress Concern Present (04/14/2017)   [place]    Feeling of Stress : Rather much  Social Connections: Moderately Isolated (04/14/2017)   Social Connection and Isolation Panel [NHANES]    Frequency of Communication with Friends and Family: More than three times a week    Frequency of Social Gatherings with Friends and Family: More than three times a week    Attends Religious Services: Never    Marine scientist or Organizations: No    Attends Archivist Meetings: Never    Marital Status: Never married  Intimate Partner Violence: Not At Risk (04/14/2017)   Humiliation, Afraid, Rape, and Kick questionnaire    Fear of Current or Ex-Partner: No    Emotionally Abused: No    Physically Abused: No    Sexually Abused: No    FAMILY HISTORY: Family History  Problem Relation Age of Onset   Drug abuse Father    ALLERGIES:  is allergic to shellfish allergy and other.  MEDICATIONS:  Current Outpatient Medications  Medication Sig Dispense Refill   albuterol (VENTOLIN HFA) 108 (90 Base) MCG/ACT inhaler Inhale 2 puffs into the lungs every 4 (four) hours as needed for wheezing or shortness of breath. 18 g 0   dexamethasone (DECADRON) 4 MG tablet Take 2 tablets (8 mg total) by mouth daily. Start  the day after chemotherapy for 1 day. 30 tablet 1   lidocaine-prilocaine (EMLA) cream Apply to affected area once 30 g 3   LORazepam (ATIVAN) 0.5 MG tablet Take 1 tablet (0.5 mg total) by mouth every 6 (six) hours as needed (Nausea or vomiting). 30 tablet 0   ondansetron (ZOFRAN) 8 MG tablet Take 1 tablet (8 mg total) by mouth 2 (two) times daily as needed for refractory nausea / vomiting. Start on day 3 after carboplatin chemo. 30 tablet 1   oxyCODONE (OXY IR/ROXICODONE) 5 MG immediate release tablet Take 1 tablet (5 mg total) by mouth every 6 (six) hours as needed for moderate pain or breakthrough pain. 12 tablet 0   pantoprazole (PROTONIX) 40 MG tablet Take 1 tablet (40 mg total) by mouth daily. 30 tablet 1    polyethylene glycol (MIRALAX / GLYCOLAX) 17 g packet Take 17 g by mouth daily. 14 each 0   prochlorperazine (COMPAZINE) 10 MG tablet Take 1 tablet (10 mg total) by mouth every 6 (six) hours as needed (Nausea or vomiting). 30 tablet 1   senna-docusate (SENOKOT-S) 8.6-50 MG tablet Take 2 tablets by mouth 2 (two) times daily. 30 tablet 0   No current facility-administered medications for this visit.    REVIEW OF SYSTEMS:    10 Point review of Systems was done is negative except as noted above.  PHYSICAL EXAMINATION: ECOG PERFORMANCE STATUS: 1 - Symptomatic but completely ambulatory  . Vitals:   10/09/21 1034  BP: (!) 137/95  Pulse: (!) 103  Temp: 97.8 F (36.6 C)  SpO2: 100%    .Body mass index is 44.13 kg/m. NAD GENERAL:alert, in no acute distress and comfortable SKIN: no acute rashes, no significant lesions EYES: conjunctiva are pink and non-injected, sclera anicteric NECK: supple, no JVD LYMPH:  no palpable lymphadenopathy in the cervical, axillary or inguinal regions LUNGS: clear to auscultation b/l with normal respiratory effort HEART: regular rate & rhythm ABDOMEN:  normoactive bowel sounds , non tender, not distended. Extremity: no pedal edema PSYCH: alert & oriented x 3 with fluent speech NEURO: no focal motor/sensory deficits  LABORATORY DATA:  I have reviewed the data as listed  .    Latest Ref Rng & Units 10/09/2021    9:50 AM 09/17/2021    9:49 AM 08/21/2021    7:49 AM  CBC  WBC 4.0 - 10.5 K/uL 11.0  11.1  7.0   Hemoglobin 13.0 - 17.0 g/dL 13.2  12.1  12.2   Hematocrit 39.0 - 52.0 % 38.6  34.8  36.4   Platelets 150 - 400 K/uL 336  482  421     .    Latest Ref Rng & Units 10/09/2021    9:50 AM 09/17/2021    9:49 AM 08/21/2021    7:49 AM  CMP  Glucose 70 - 99 mg/dL 108  116  99   BUN 6 - 20 mg/dL '17  13  17   '$ Creatinine 0.61 - 1.24 mg/dL 0.96  0.80  0.83   Sodium 135 - 145 mmol/L 138  139  140   Potassium 3.5 - 5.1 mmol/L 3.7  3.7  3.7   Chloride  98 - 111 mmol/L 104  105  105   CO2 22 - 32 mmol/L '27  28  28   '$ Calcium 8.9 - 10.3 mg/dL 9.4  9.4  9.2   Total Protein 6.5 - 8.1 g/dL 7.6  7.8  7.1   Total Bilirubin 0.3 - 1.2 mg/dL 0.4  0.8  0.6   Alkaline Phos 38 - 126 U/L 130  234  157   AST 15 - 41 U/L 20  30  32   ALT 0 - 44 U/L 42  173  149    Cytology done 07/13/2021 revealed "FINAL MICROSCOPIC DIAGNOSIS:  A. PANCREAS, HEAD, FINE NEEDLE ASPIRATION:  - Malignant cells present  - Poorly differentiated/high-grade neuroendocrine carcinoma (see  comment)   B. CBD STRICTURE, BRUSHING:  - Atypical cells suspicious for tumor   C. BILIARY DILATION, BALLOON, REMOVAL:  - Atypical cells suspicious for tumor "  Surgical pathology done 07/13/2021 revealed "FINAL MICROSCOPIC DIAGNOSIS:   A. STOMACH, BIOPSY:  Reactive gastropathy and minimal chronic gastritis with lymphoid  aggregate  Negative for H. pylori, intestinal metaplasia, dysplasia and carcinoma"  RADIOGRAPHIC STUDIES: I have personally reviewed the radiological images as listed and agreed with the findings in the report. CT CHEST ABDOMEN PELVIS W CONTRAST  Result Date: 10/03/2021 CLINICAL DATA:  Metastatic pancreatic high-grade neuroendocrine tumor. * Tracking Code: BO * EXAM: CT CHEST, ABDOMEN, AND PELVIS WITH CONTRAST TECHNIQUE: Multidetector CT imaging of the chest, abdomen and pelvis was performed following the standard protocol during bolus administration of intravenous contrast. RADIATION DOSE REDUCTION: This exam was performed according to the departmental dose-optimization program which includes automated exposure control, adjustment of the mA and/or kV according to patient size and/or use of iterative reconstruction technique. CONTRAST:  192m OMNIPAQUE IOHEXOL 300 MG/ML  SOLN COMPARISON:  ERCP 07/13/2021, CT abdomen pelvis 07/11/2021, CT chest 07/03/2021, MR abdomen 07/02/2021. FINDINGS: CT CHEST FINDINGS Cardiovascular: Right IJ Port-A-Cath terminates in the right atrium.  Heart is at the upper limits of normal in size. No pericardial effusion. Mediastinum/Nodes: No pathologically enlarged mediastinal, hilar or axillary lymph nodes. Esophagus is grossly unremarkable. Lungs/Pleura: Minimal scattered subsegmental volume loss. Lungs are otherwise clear. Calcified granuloma in the medial right lower lobe. No pleural fluid. Airway is unremarkable. Musculoskeletal: Degenerative changes in the spine. No worrisome lytic or sclerotic lesions. CT ABDOMEN PELVIS FINDINGS Hepatobiliary: Vague hypodense lesions in the liver appear grossly similar to 07/11/2021. Index lesion in the dome of the right hepatic lobe, 1.3 cm (2/43). Liver and gallbladder are otherwise unremarkable. A stent is seen in the lower common bile duct, terminating in the duodenum. No biliary ductal dilatation. Pancreas: Pancreatic head mass measures approximately 2.4 x 3.2 cm, similar. Pancreatic ductal dilatation and gland atrophy in association. Spleen: Negative. Adrenals/Urinary Tract: Adrenal glands and kidneys are unremarkable. Ureters are decompressed. Bladder is grossly unremarkable. Stomach/Bowel: Stomach, small bowel, appendix and colon are unremarkable. 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 There may be slight attenuation of the portal vein near the splenic portal confluence. Findings are similar to 07/11/2021. Peripancreatic adenopathy measures up to 1.6 cm (2/57), similar. Abdominal retroperitoneal adenopathy measures up to 1.6 cm posterior to the IVC, also similar. Gastrohepatic ligament and small bowel mesenteric lymph nodes are not enlarged by CT size criteria. Reproductive: Prostate is visualized. Other: No free fluid. Mesenteries and peritoneum are otherwise unremarkable. Musculoskeletal: Mild degenerative changes in the spine. No worrisome lytic or sclerotic lesions. IMPRESSION: 1. Pancreatic mass and peripancreatic/retroperitoneal adenopathy, stable. 2. Vague  hypodense lesions in the liver are grossly stable and thought to be metastatic on MR abdomen 07/02/2021. 3. Attenuation of the superior mesenteric vein, as before. Electronically Signed   By: MLorin PicketM.D.   On: 10/03/2021 11:24     ASSESSMENT & PLAN:   31y.o. very pleasant male with  1. 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 -Cytology done 07/13/2021 shows presence of malignant cells in pancreas and poorly differentiated/high grade neuroendocrine carcinoma.  Plan -I discussed available labs with the patient in details Labs done today were reviewed in detail. CBC shows Hgb normalized at 13.2, WBC count of 11k, and platelet count of 336k. CMP is stable. Liver enzymes normalized. -Patient's chart reviewed in detail -We discussed his CT chest, abd, pelvis done 10/02/2021 which revealed stable disease with no overt signs of progression. - patient with no significant toxicities from C3 of carboplatin/etoposide and okay to proceed with C4 of treatment. -will plan to get PET/CU-64 after 5 cycles of treatment to evaluate for residual disease activity and to determine octreotide receptor positivity to plan for subsequent treatments. Follow up: F/u as scheduled for next cycle of carboplatin etoposide chemotherapy.  .The total time spent in the appointment was 31 minutes* .  All of the patient's questions were answered with apparent satisfaction. The patient knows to call the clinic with any problems, questions or concerns.   Sullivan Lone MD MS AAHIVMS [facility] Hematology/Oncology Physician [facility]  .*Total Encounter Time as defined by the Centers for Medicare and Medicaid Services includes, in addition to the face-to-face time of a patient visit (documented in the note above) non-face-to-face time: obtaining and reviewing outside history, ordering and reviewing medications, tests  or procedures, care coordination (communications with other health care professionals or caregivers) and documentation in the medical record.  I, Melene Muller, am acting as scribe for Dr. Sullivan Lone, MD. .I have reviewed the above documentation for accuracy and completeness, and I agree with the above.[name] MD

## 2021-10-10 ENCOUNTER — Inpatient Hospital Stay: Payer: 59

## 2021-10-10 VITALS — BP 128/82 | HR 88 | Temp 97.8°F | Resp 17

## 2021-10-10 DIAGNOSIS — Z5111 Encounter for antineoplastic chemotherapy: Secondary | ICD-10-CM | POA: Diagnosis not present

## 2021-10-10 DIAGNOSIS — Z7189 Other specified counseling: Secondary | ICD-10-CM

## 2021-10-10 DIAGNOSIS — C7A1 Malignant poorly differentiated neuroendocrine tumors: Secondary | ICD-10-CM

## 2021-10-10 MED ORDER — SODIUM CHLORIDE 0.9 % IV SOLN
10.0000 mg | Freq: Once | INTRAVENOUS | Status: AC
  Administered 2021-10-10: 10 mg via INTRAVENOUS
  Filled 2021-10-10: qty 10

## 2021-10-10 MED ORDER — SODIUM CHLORIDE 0.9 % IV SOLN
80.0000 mg/m2 | Freq: Once | INTRAVENOUS | Status: AC
  Administered 2021-10-10: 220 mg via INTRAVENOUS
  Filled 2021-10-10: qty 11

## 2021-10-10 MED ORDER — SODIUM CHLORIDE 0.9 % IV SOLN: Freq: Once | INTRAVENOUS | Status: AC

## 2021-10-10 MED FILL — Dexamethasone Sodium Phosphate Inj 100 MG/10ML: INTRAMUSCULAR | Qty: 1 | Status: AC

## 2021-10-10 NOTE — Patient Instructions (Signed)
[facility]  Discharge Instructions: Thank you for choosing [facility] to provide your oncology and hematology care.   If you have a lab appointment with the Cancer Center, please go directly to the Cancer Center and check in at the registration area.   Wear comfortable clothing and clothing appropriate for easy access to any Portacath or PICC line.   We strive to give you quality time with your provider. You may need to reschedule your appointment if you arrive late (15 or more minutes).  Arriving late affects you and other patients whose appointments are after yours.  Also, if you miss three or more appointments without notifying the office, you may be dismissed from the clinic at the provider's discretion.      For prescription refill requests, have your pharmacy contact our office and allow 72 hours for refills to be completed.    Today you received the following chemotherapy and/or immunotherapy agents: Carboplatin, Etoposide.       To help prevent nausea and vomiting after your treatment, we encourage you to take your nausea medication as directed.  BELOW ARE SYMPTOMS THAT SHOULD BE REPORTED IMMEDIATELY: *FEVER GREATER THAN 100.4 F (38 C) OR HIGHER *CHILLS OR SWEATING *NAUSEA AND VOMITING THAT IS NOT CONTROLLED WITH YOUR NAUSEA MEDICATION *UNUSUAL SHORTNESS OF BREATH *UNUSUAL BRUISING OR BLEEDING *URINARY PROBLEMS (pain or burning when urinating, or frequent urination) *BOWEL PROBLEMS (unusual diarrhea, constipation, pain near the anus) TENDERNESS IN MOUTH AND THROAT WITH OR WITHOUT PRESENCE OF ULCERS (sore throat, sores in mouth, or a toothache) UNUSUAL RASH, SWELLING OR PAIN  UNUSUAL VAGINAL DISCHARGE OR ITCHING   Items with * indicate a potential emergency and should be followed up as soon as possible or go to the Emergency Department if any problems should occur.  Please show the CHEMOTHERAPY ALERT CARD or IMMUNOTHERAPY ALERT CARD  at check-in to the Emergency Department and triage nurse.  Should you have questions after your visit or need to cancel or reschedule your appointment, please contact [facility]  Dept: [phone]  and follow the prompts.  Office hours are 8:00 a.m. to 4:30 p.m. Monday - Friday. Please note that voicemails left after 4:00 p.m. may not be returned until the following business day.  We are closed weekends and major holidays. You have access to a nurse at all times for urgent questions. Please call the main number to the clinic Dept: [phone] and follow the prompts.   For any non-urgent questions, you may also contact your provider using MyChart. We now offer e-Visits for anyone 18 and older to request care online for non-urgent symptoms. For details visit [email or web address].   Also download the MyChart app! Go to the app store, search "MyChart", open the app, select [facility], and log in with your MyChart username and password.  Masks are optional in the cancer centers. If you would like for your care team to wear a mask while they are taking care of you, please let them know. You may have one support person who is at least 31 years old accompany you for your appointments. Carboplatin Injection What is this medication? CARBOPLATIN (KAR boe pla tin) treats some types of cancer. It works by slowing down the growth of cancer cells. This medicine may be used for other purposes; ask your health care provider or pharmacist if you have questions. COMMON BRAND NAME(S): Paraplatin What should I tell my care team before I take this   medication? They need to know if you have any of these conditions: Blood disorders Hearing problems Kidney disease Recent or ongoing radiation therapy An unusual or allergic reaction to carboplatin, cisplatin, other medications, foods, dyes, or preservatives Pregnant or trying to get pregnant Breast-feeding How should I use this  medication? This medication is injected into a vein. It is given by your care team in a hospital or clinic setting. Talk to your care team about the use of this medication in children. Special care may be needed. Overdosage: If you think you have taken too much of this medicine contact a poison control center or emergency room at once. NOTE: This medicine is only for you. Do not share this medicine with others. What if I miss a dose? Keep appointments for follow-up doses. It is important not to miss your dose. Call your care team if you are unable to keep an appointment. What may interact with this medication? Medications for seizures Some antibiotics, such as amikacin, gentamicin, neomycin, streptomycin, tobramycin Vaccines This list may not describe all possible interactions. Give your health care provider a list of all the medicines, herbs, non-prescription drugs, or dietary supplements you use. Also tell them if you smoke, drink alcohol, or use illegal drugs. Some items may interact with your medicine. What should I watch for while using this medication? Your condition will be monitored carefully while you are receiving this medication. You may need blood work while taking this medication. This medication may make you feel generally unwell. This is not uncommon, as chemotherapy can affect healthy cells as well as cancer cells. Report any side effects. Continue your course of treatment even though you feel ill unless your care team tells you to stop. In some cases, you may be given additional medications to help with side effects. Follow all directions for their use. This medication may increase your risk of getting an infection. Call your care team for advice if you get a fever, chills, sore throat, or other symptoms of a cold or flu. Do not treat yourself. Try to avoid being around people who are sick. Avoid taking medications that contain aspirin, acetaminophen, ibuprofen, naproxen, or  ketoprofen unless instructed by your care team. These medications may hide a fever. Be careful brushing or flossing your teeth or using a toothpick because you may get an infection or bleed more easily. If you have any dental work done, tell your dentist you are receiving this medication. Talk to your care team if you wish to become pregnant or think you might be pregnant. This medication can cause serious birth defects. Talk to your care team about effective forms of contraception. Do not breast-feed while taking this medication. What side effects may I notice from receiving this medication? Side effects that you should report to your care team as soon as possible: Allergic reactions--skin rash, itching, hives, swelling of the face, lips, tongue, or throat Infection--fever, chills, cough, sore throat, wounds that don't heal, pain or trouble when passing urine, general feeling of discomfort or being unwell Low red blood cell level--unusual weakness or fatigue, dizziness, headache, trouble breathing Pain, tingling, or numbness in the hands or feet, muscle weakness, change in vision, confusion or trouble speaking, loss of balance or coordination, trouble walking, seizures Unusual bruising or bleeding Side effects that usually do not require medical attention (report to your care team if they continue or are bothersome): Hair loss Nausea Unusual weakness or fatigue Vomiting This list may not describe all possible side effects.   Call your doctor for medical advice about side effects. You may report side effects to FDA at 1-800-FDA-1088. Where should I keep my medication? This medication is given in a hospital or clinic. It will not be stored at home. NOTE: This sheet is a summary. It may not cover all possible information. If you have questions about this medicine, talk to your doctor, pharmacist, or health care provider.  2023 Elsevier/Gold Standard (2021-05-15 00:00:00) Etoposide Injection What  is this medication? ETOPOSIDE (e toe POE side) treats some types of cancer. It works by slowing down the growth of cancer cells. This medicine may be used for other purposes; ask your health care provider or pharmacist if you have questions. COMMON BRAND NAME(S): Etopophos, Toposar, VePesid What should I tell my care team before I take this medication? They need to know if you have any of these conditions: Infection Kidney disease Liver disease Low blood counts, such as low white cell, platelet, red cell counts An unusual or allergic reaction to etoposide, other medications, foods, dyes, or preservatives If you or your partner are pregnant or trying to get pregnant Breastfeeding How should I use this medication? This medication is injected into a vein. It is given by your care team in a hospital or clinic setting. Talk to your care team about the use of this medication in children. Special care may be needed. Overdosage: If you think you have taken too much of this medicine contact a poison control center or emergency room at once. NOTE: This medicine is only for you. Do not share this medicine with others. What if I miss a dose? Keep appointments for follow-up doses. It is important not to miss your dose. Call your care team if you are unable to keep an appointment. What may interact with this medication? Warfarin This list may not describe all possible interactions. Give your health care provider a list of all the medicines, herbs, non-prescription drugs, or dietary supplements you use. Also tell them if you smoke, drink alcohol, or use illegal drugs. Some items may interact with your medicine. What should I watch for while using this medication? Your condition will be monitored carefully while you are receiving this medication. This medication may make you feel generally unwell. This is not uncommon as chemotherapy can affect healthy cells as well as cancer cells. Report any side effects.  Continue your course of treatment even though you feel ill unless your care team tells you to stop. This medication can cause serious side effects. To reduce the risk, your care team may give you other medications to take before receiving this one. Be sure to follow the directions from your care team. This medication may increase your risk of getting an infection. Call your care team for advice if you get a fever, chills, sore throat, or other symptoms of a cold or flu. Do not treat yourself. Try to avoid being around people who are sick. This medication may increase your risk to bruise or bleed. Call your care team if you notice any unusual bleeding. Talk to your care team about your risk of cancer. You may be more at risk for certain types of cancers if you take this medication. Talk to your care team if you may be pregnant. Serious birth defects can occur if you take this medication during pregnancy and for 6 months after the last dose. You will need a negative pregnancy test before starting this medication. Contraception is recommended while taking this medication and for   6 months after the last dose. Your care team can help you find the option that works for you. If your partner can get pregnant, use a condom during sex while taking this medication and for 4 months after the last dose. Do not breastfeed while taking this medication. This medication may cause infertility. Talk to your care team if you are concerned about your fertility. What side effects may I notice from receiving this medication? Side effects that you should report to your care team as soon as possible: Allergic reactions--skin rash, itching, hives, swelling of the face, lips, tongue, or throat Infection--fever, chills, cough, sore throat, wounds that don't heal, pain or trouble when passing urine, general feeling of discomfort or being unwell Low red blood cell level--unusual weakness or fatigue, dizziness, headache, trouble  breathing Unusual bruising or bleeding Side effects that usually do not require medical attention (report to your care team if they continue or are bothersome): Diarrhea Fatigue Hair loss Loss of appetite Nausea Vomiting This list may not describe all possible side effects. Call your doctor for medical advice about side effects. You may report side effects to FDA at 1-800-FDA-1088. Where should I keep my medication? This medication is given in a hospital or clinic. It will not be stored at home. NOTE: This sheet is a summary. It may not cover all possible information. If you have questions about this medicine, talk to your doctor, pharmacist, or health care provider.  2023 Elsevier/Gold Standard (2021-06-14 00:00:00)  

## 2021-10-11 ENCOUNTER — Inpatient Hospital Stay: Payer: 59

## 2021-10-11 ENCOUNTER — Other Ambulatory Visit: Payer: Self-pay

## 2021-10-11 VITALS — BP 131/89 | HR 79 | Temp 98.1°F | Resp 18

## 2021-10-11 DIAGNOSIS — C7A1 Malignant poorly differentiated neuroendocrine tumors: Secondary | ICD-10-CM

## 2021-10-11 DIAGNOSIS — Z7189 Other specified counseling: Secondary | ICD-10-CM

## 2021-10-11 DIAGNOSIS — Z5111 Encounter for antineoplastic chemotherapy: Secondary | ICD-10-CM | POA: Diagnosis not present

## 2021-10-11 MED ORDER — SODIUM CHLORIDE 0.9% FLUSH
10.0000 mL | INTRAVENOUS | Status: DC | PRN
  Administered 2021-10-11: 10 mL

## 2021-10-11 MED ORDER — SODIUM CHLORIDE 0.9 % IV SOLN
80.0000 mg/m2 | Freq: Once | INTRAVENOUS | Status: AC
  Administered 2021-10-11: 220 mg via INTRAVENOUS
  Filled 2021-10-11: qty 11

## 2021-10-11 MED ORDER — HEPARIN SOD (PORK) LOCK FLUSH 100 UNIT/ML IV SOLN
500.0000 [IU] | Freq: Once | INTRAVENOUS | Status: AC | PRN
  Administered 2021-10-11: 500 [IU]

## 2021-10-11 MED ORDER — SODIUM CHLORIDE 0.9 % IV SOLN: Freq: Once | INTRAVENOUS | Status: AC

## 2021-10-11 MED ORDER — SODIUM CHLORIDE 0.9 % IV SOLN
10.0000 mg | Freq: Once | INTRAVENOUS | Status: AC
  Administered 2021-10-11: 10 mg via INTRAVENOUS
  Filled 2021-10-11: qty 10

## 2021-10-11 MED ORDER — PEGFILGRASTIM 6 MG/0.6ML ~~LOC~~ PSKT
6.0000 mg | PREFILLED_SYRINGE | Freq: Once | SUBCUTANEOUS | Status: AC
  Administered 2021-10-11: 6 mg via SUBCUTANEOUS
  Filled 2021-10-11: qty 0.6

## 2021-10-11 NOTE — Patient Instructions (Signed)
[facility]COL[facility]  Discharge Instructions: Thank you for choosing [facility] to provide your oncology and hematology care.   If you have a lab appointment with the Cancer Center, please go directly to the Cancer Center and check in at the registration area.   Wear comfortable clothing and clothing appropriate for easy access to any Portacath or PICC line.   We strive to give you quality time with your provider. You may need to reschedule your appointment if you arrive late (15 or more minutes).  Arriving late affects you and other patients whose appointments are after yours.  Also, if you miss three or more appointments without notifying the office, you may be dismissed from the clinic at the provider's discretion.      For prescription refill requests, have your pharmacy contact our office and allow 72 hours for refills to be completed.    Today you received the following chemotherapy and/or immunotherapy agents: Carboplatin, Etoposide.       To help prevent nausea and vomiting after your treatment, we encourage you to take your nausea medication as directed.  BELOW ARE SYMPTOMS THAT SHOULD BE REPORTED IMMEDIATELY: *FEVER GREATER THAN 100.4 F (38 C) OR HIGHER *CHILLS OR SWEATING *NAUSEA AND VOMITING THAT IS NOT CONTROLLED WITH YOUR NAUSEA MEDICATION *UNUSUAL SHORTNESS OF BREATH *UNUSUAL BRUISING OR BLEEDING *URINARY PROBLEMS (pain or burning when urinating, or frequent urination) *BOWEL PROBLEMS (unusual diarrhea, constipation, pain near the anus) TENDERNESS IN MOUTH AND THROAT WITH OR WITHOUT PRESENCE OF ULCERS (sore throat, sores in mouth, or a toothache) UNUSUAL RASH, SWELLING OR PAIN  UNUSUAL VAGINAL DISCHARGE OR ITCHING   Items with * indicate a potential emergency and should be followed up as soon as possible or go to the Emergency Department if any problems should occur.  Please show the CHEMOTHERAPY ALERT CARD or IMMUNOTHERAPY ALERT CARD  at check-in to the Emergency Department and triage nurse.  Should you have questions after your visit or need to cancel or reschedule your appointment, please contact [facility]  Dept: [phone]  and follow the prompts.  Office hours are 8:00 a.m. to 4:30 p.m. Monday - Friday. Please note that voicemails left after 4:00 p.m. may not be returned until the following business day.  We are closed weekends and major holidays. You have access to a nurse at all times for urgent questions. Please call the main number to the clinic Dept: [phone] and follow the prompts.   For any non-urgent questions, you may also contact your provider using MyChart. We now offer e-Visits for anyone 18 and older to request care online for non-urgent symptoms. For details visit [email or web address].   Also download the MyChart app! Go to the app store, search "MyChart", open the app, select [facility], and log in with your MyChart username and password.  Masks are optional in the cancer centers. If you would like for your care team to wear a mask while they are taking care of you, please let them know. You may have one support Leon Fischer who is at least 31 years old accompany you for your appointments. Carboplatin Injection What is this medication? CARBOPLATIN (KAR boe pla tin) treats some types of cancer. It works by slowing down the growth of cancer cells. This medicine may be used for other purposes; ask your health care provider or pharmacist if you have questions. COMMON BRAND NAME(S): Paraplatin What should I tell my care team before I take this   medication? They need to know if you have any of these conditions: Blood disorders Hearing problems Kidney disease Recent or ongoing radiation therapy An unusual or allergic reaction to carboplatin, cisplatin, other medications, foods, dyes, or preservatives Pregnant or trying to get pregnant Breast-feeding How should I use this  medication? This medication is injected into a vein. It is given by your care team in a hospital or clinic setting. Talk to your care team about the use of this medication in children. Special care may be needed. Overdosage: If you think you have taken too much of this medicine contact a poison control center or emergency room at once. NOTE: This medicine is only for you. Do not share this medicine with others. What if I miss a dose? Keep appointments for follow-up doses. It is important not to miss your dose. Call your care team if you are unable to keep an appointment. What may interact with this medication? Medications for seizures Some antibiotics, such as amikacin, gentamicin, neomycin, streptomycin, tobramycin Vaccines This list may not describe all possible interactions. Give your health care provider a list of all the medicines, herbs, non-prescription drugs, or dietary supplements you use. Also tell them if you smoke, drink alcohol, or use illegal drugs. Some items may interact with your medicine. What should I watch for while using this medication? Your condition will be monitored carefully while you are receiving this medication. You may need blood work while taking this medication. This medication may make you feel generally unwell. This is not uncommon, as chemotherapy can affect healthy cells as well as cancer cells. Report any side effects. Continue your course of treatment even though you feel ill unless your care team tells you to stop. In some cases, you may be given additional medications to help with side effects. Follow all directions for their use. This medication may increase your risk of getting an infection. Call your care team for advice if you get a fever, chills, sore throat, or other symptoms of a cold or flu. Do not treat yourself. Try to avoid being around people who are sick. Avoid taking medications that contain aspirin, acetaminophen, ibuprofen, naproxen, or  ketoprofen unless instructed by your care team. These medications may hide a fever. Be careful brushing or flossing your teeth or using a toothpick because you may get an infection or bleed more easily. If you have any dental work done, tell your dentist you are receiving this medication. Talk to your care team if you wish to become pregnant or think you might be pregnant. This medication can cause serious birth defects. Talk to your care team about effective forms of contraception. Do not breast-feed while taking this medication. What side effects may I notice from receiving this medication? Side effects that you should report to your care team as soon as possible: Allergic reactions--skin rash, itching, hives, swelling of the face, lips, tongue, or throat Infection--fever, chills, cough, sore throat, wounds that don't heal, pain or trouble when passing urine, general feeling of discomfort or being unwell Low red blood cell level--unusual weakness or fatigue, dizziness, headache, trouble breathing Pain, tingling, or numbness in the hands or feet, muscle weakness, change in vision, confusion or trouble speaking, loss of balance or coordination, trouble walking, seizures Unusual bruising or bleeding Side effects that usually do not require medical attention (report to your care team if they continue or are bothersome): Hair loss Nausea Unusual weakness or fatigue Vomiting This list may not describe all possible side effects.   Call your doctor for medical advice about side effects. You may report side effects to FDA at 1-800-FDA-1088. Where should I keep my medication? This medication is given in a hospital or clinic. It will not be stored at home. NOTE: This sheet is a summary. It may not cover all possible information. If you have questions about this medicine, talk to your doctor, pharmacist, or health care provider.  2023 Elsevier/Gold Standard (2021-05-15 00:00:00) Etoposide Injection What  is this medication? ETOPOSIDE (e toe POE side) treats some types of cancer. It works by slowing down the growth of cancer cells. This medicine may be used for other purposes; ask your health care provider or pharmacist if you have questions. COMMON BRAND NAME(S): Etopophos, Toposar, VePesid What should I tell my care team before I take this medication? They need to know if you have any of these conditions: Infection Kidney disease Liver disease Low blood counts, such as low white cell, platelet, red cell counts An unusual or allergic reaction to etoposide, other medications, foods, dyes, or preservatives If you or your partner are pregnant or trying to get pregnant Breastfeeding How should I use this medication? This medication is injected into a vein. It is given by your care team in a hospital or clinic setting. Talk to your care team about the use of this medication in children. Special care may be needed. Overdosage: If you think you have taken too much of this medicine contact a poison control center or emergency room at once. NOTE: This medicine is only for you. Do not share this medicine with others. What if I miss a dose? Keep appointments for follow-up doses. It is important not to miss your dose. Call your care team if you are unable to keep an appointment. What may interact with this medication? Warfarin This list may not describe all possible interactions. Give your health care provider a list of all the medicines, herbs, non-prescription drugs, or dietary supplements you use. Also tell them if you smoke, drink alcohol, or use illegal drugs. Some items may interact with your medicine. What should I watch for while using this medication? Your condition will be monitored carefully while you are receiving this medication. This medication may make you feel generally unwell. This is not uncommon as chemotherapy can affect healthy cells as well as cancer cells. Report any side effects.  Continue your course of treatment even though you feel ill unless your care team tells you to stop. This medication can cause serious side effects. To reduce the risk, your care team may give you other medications to take before receiving this one. Be sure to follow the directions from your care team. This medication may increase your risk of getting an infection. Call your care team for advice if you get a fever, chills, sore throat, or other symptoms of a cold or flu. Do not treat yourself. Try to avoid being around people who are sick. This medication may increase your risk to bruise or bleed. Call your care team if you notice any unusual bleeding. Talk to your care team about your risk of cancer. You may be more at risk for certain types of cancers if you take this medication. Talk to your care team if you may be pregnant. Serious birth defects can occur if you take this medication during pregnancy and for 6 months after the last dose. You will need a negative pregnancy test before starting this medication. Contraception is recommended while taking this medication and for   6 months after the last dose. Your care team can help you find the option that works for you. If your partner can get pregnant, use a condom during sex while taking this medication and for 4 months after the last dose. Do not breastfeed while taking this medication. This medication may cause infertility. Talk to your care team if you are concerned about your fertility. What side effects may I notice from receiving this medication? Side effects that you should report to your care team as soon as possible: Allergic reactions--skin rash, itching, hives, swelling of the face, lips, tongue, or throat Infection--fever, chills, cough, sore throat, wounds that don't heal, pain or trouble when passing urine, general feeling of discomfort or being unwell Low red blood cell level--unusual weakness or fatigue, dizziness, headache, trouble  breathing Unusual bruising or bleeding Side effects that usually do not require medical attention (report to your care team if they continue or are bothersome): Diarrhea Fatigue Hair loss Loss of appetite Nausea Vomiting This list may not describe all possible side effects. Call your doctor for medical advice about side effects. You may report side effects to FDA at 1-800-FDA-1088. Where should I keep my medication? This medication is given in a hospital or clinic. It will not be stored at home. NOTE: This sheet is a summary. It may not cover all possible information. If you have questions about this medicine, talk to your doctor, pharmacist, or health care provider.  2023 Elsevier/Gold Standard (2021-06-14 00:00:00)  

## 2021-10-14 ENCOUNTER — Encounter: Payer: Self-pay | Admitting: Hematology

## 2021-10-25 ENCOUNTER — Other Ambulatory Visit: Payer: Self-pay | Admitting: Gastroenterology

## 2021-10-26 ENCOUNTER — Other Ambulatory Visit: Payer: Self-pay

## 2021-10-26 DIAGNOSIS — C7A1 Malignant poorly differentiated neuroendocrine tumors: Secondary | ICD-10-CM

## 2021-10-27 ENCOUNTER — Other Ambulatory Visit: Payer: Self-pay

## 2021-10-29 MED FILL — Dexamethasone Sodium Phosphate Inj 100 MG/10ML: INTRAMUSCULAR | Qty: 1 | Status: AC

## 2021-10-29 MED FILL — Fosaprepitant Dimeglumine For IV Infusion 150 MG (Base Eq): INTRAVENOUS | Qty: 5 | Status: AC

## 2021-10-30 ENCOUNTER — Other Ambulatory Visit: Payer: Self-pay

## 2021-10-30 ENCOUNTER — Telehealth: Payer: Self-pay | Admitting: *Deleted

## 2021-10-30 ENCOUNTER — Inpatient Hospital Stay: Payer: 59

## 2021-10-30 ENCOUNTER — Inpatient Hospital Stay (HOSPITAL_BASED_OUTPATIENT_CLINIC_OR_DEPARTMENT_OTHER): Payer: 59 | Admitting: Hematology

## 2021-10-30 VITALS — BP 126/96 | HR 90 | Temp 97.7°F | Resp 17 | Ht 72.0 in | Wt 331.6 lb

## 2021-10-30 DIAGNOSIS — C7A1 Malignant poorly differentiated neuroendocrine tumors: Secondary | ICD-10-CM

## 2021-10-30 DIAGNOSIS — Z5111 Encounter for antineoplastic chemotherapy: Secondary | ICD-10-CM | POA: Diagnosis not present

## 2021-10-30 DIAGNOSIS — R7989 Other specified abnormal findings of blood chemistry: Secondary | ICD-10-CM

## 2021-10-30 DIAGNOSIS — Z95828 Presence of other vascular implants and grafts: Secondary | ICD-10-CM

## 2021-10-30 LAB — CBC WITH DIFFERENTIAL (CANCER CENTER ONLY)
Abs Immature Granulocytes: 0.07 10*3/uL (ref 0.00–0.07)
Basophils Absolute: 0 10*3/uL (ref 0.0–0.1)
Basophils Relative: 0 %
Eosinophils Absolute: 0.2 10*3/uL (ref 0.0–0.5)
Eosinophils Relative: 2 %
HCT: 39.3 % (ref 39.0–52.0)
Hemoglobin: 13.5 g/dL (ref 13.0–17.0)
Immature Granulocytes: 1 %
Lymphocytes Relative: 11 %
Lymphs Abs: 1.2 10*3/uL (ref 0.7–4.0)
MCH: 32.1 pg (ref 26.0–34.0)
MCHC: 34.4 g/dL (ref 30.0–36.0)
MCV: 93.3 fL (ref 80.0–100.0)
Monocytes Absolute: 1 10*3/uL (ref 0.1–1.0)
Monocytes Relative: 9 %
Neutro Abs: 8.4 10*3/uL — ABNORMAL HIGH (ref 1.7–7.7)
Neutrophils Relative %: 77 %
Platelet Count: 269 10*3/uL (ref 150–400)
RBC: 4.21 MIL/uL — ABNORMAL LOW (ref 4.22–5.81)
RDW: 14.6 % (ref 11.5–15.5)
WBC Count: 10.9 10*3/uL — ABNORMAL HIGH (ref 4.0–10.5)
nRBC: 0 % (ref 0.0–0.2)

## 2021-10-30 LAB — CMP (CANCER CENTER ONLY)
ALT: 297 U/L (ref 0–44)
AST: 381 U/L (ref 15–41)
Albumin: 4.2 g/dL (ref 3.5–5.0)
Alkaline Phosphatase: 136 U/L — ABNORMAL HIGH (ref 38–126)
Anion gap: 4 — ABNORMAL LOW (ref 5–15)
BUN: 12 mg/dL (ref 6–20)
CO2: 28 mmol/L (ref 22–32)
Calcium: 9.2 mg/dL (ref 8.9–10.3)
Chloride: 105 mmol/L (ref 98–111)
Creatinine: 0.89 mg/dL (ref 0.61–1.24)
GFR, Estimated: >60 mL/min (ref >60)
Glucose, Bld: 116 mg/dL — ABNORMAL HIGH (ref 70–99)
Potassium: 4 mmol/L (ref 3.5–5.1)
Sodium: 137 mmol/L (ref 135–145)
Total Bilirubin: 0.7 mg/dL (ref 0.3–1.2)
Total Protein: 7.6 g/dL (ref 6.5–8.1)

## 2021-10-30 MED ORDER — SODIUM CHLORIDE 0.9% FLUSH: 10.0000 mL | INTRAVENOUS | Status: DC

## 2021-10-30 MED ORDER — SODIUM CHLORIDE 0.9% FLUSH
10.0000 mL | Freq: Once | INTRAVENOUS | Status: AC
  Administered 2021-10-30: 10 mL

## 2021-10-30 MED ORDER — ALBUTEROL SULFATE HFA 108 (90 BASE) MCG/ACT IN AERS: 2.0000 | INHALATION_SPRAY | RESPIRATORY_TRACT | 0 refills | Status: DC | PRN

## 2021-10-30 MED ORDER — SODIUM CHLORIDE 0.9% FLUSH
10.0000 mL | Freq: Once | INTRAVENOUS | Status: AC
  Administered 2021-10-30: 10 mL via INTRAVENOUS

## 2021-10-30 MED ORDER — HEPARIN SOD (PORK) LOCK FLUSH 100 UNIT/ML IV SOLN: 500.0000 [IU] | Freq: Once | INTRAVENOUS | Status: DC

## 2021-10-30 MED ORDER — SODIUM CHLORIDE FLUSH 0.9 % IV SOLN: 10.0000 mL | INTRAVENOUS | Status: DC

## 2021-10-30 MED ORDER — HEPARIN SOD (PORK) LOCK FLUSH 100 UNIT/ML IV SOLN
500.0000 [IU] | Freq: Once | INTRAVENOUS | Status: AC
  Administered 2021-10-30: 500 [IU] via INTRAVENOUS

## 2021-10-30 MED FILL — Dexamethasone Sodium Phosphate Inj 100 MG/10ML: INTRAMUSCULAR | Qty: 1 | Status: AC

## 2021-10-30 NOTE — Telephone Encounter (Signed)
CRITICAL VALUE STICKER  CRITICAL VALUE:  AST 381  ALT 297  RECEIVER (on-site recipient of call): Theus Espin Suttyn Cryder   DATE & TIME NOTIFIED: 1128  MD NOTIFIED: message given to Shawn Stall, RN with Dr Irene Limbo   TIME OF NOTIFICATION:1140  RESPONSE:  waiting on MD

## 2021-10-30 NOTE — Addendum Note (Signed)
Addended by: Carolynne Edouard B on: 10/30/2021 12:21 PM   Modules accepted: Orders

## 2021-10-30 NOTE — Progress Notes (Signed)
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  HISTORY OF PRESENTING ILLNESS:  Leon Fischer. is a wonderful 31 y.o. 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 He presents today accompanied by his mother. He reports He is doing well.  He reports no color change in stool.  He notes that he is eating well but feels food just sits in stomach.  We discussed getting additional scans and labs for further evaluation and treatment which he was agreeable to. We further discussed getting MRI of brain and PET scan which he was agreeable to.  He notes that he is trying to hold it together mentally and is still trying to process his new diagnosis. We discussed the options of psychological or spiritual counseling which he has not decided on at this time. He was advised that may call back if he decides that he would like to pursue either.  We discussed getting a port a cath installed which he was agreeable to.  He recently had a biliary stent put into common bile duct.   No abdominal pain or change in bowel habits. No new or unexpected weight loss. No other new or acute focal symptoms.  We discussed his recent stomach biopsy done 07/13/2021 which sowed gastropathy and minor chronic gastritis with lymphoid aggregate. Negative for H. Pylori.  We discussed recent cytology done 07/13/2021 which showed presence of malignant cells in pancreas and poorly differentiated/high grade neuroendocrine carcinoma.  We discussed upper endoscopy done 07/13/2021 revealed mass in  pancreatic head and some enlarged lymph nodes were seen in the celiac and peripancreatic regions.  Labs done today were reviewed in detail.  INTERVAL HISTORY: Leon Fischer. Is a 31 y.o. 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 He reports He is doing well with no new symptoms or concerns.  We discussed next line treatment options including just taking a conservative approach and monitoring. We also discussed getting an additional opinion from [place] which he is agreeable to.  He reports he is tolerating the carboplatin etoposide chemotherapy well with no prohibitive toxicities at this time.  We discussed that due to his elevated liver enzymes we will hold treatment today and schedule him to get high resolution triphasic ct liver for further evaluation which he is agreeable to.  No leg swelling. No new or unexpected weight loss. No abdominal pain or distension. No new neuropathy. No other new or acute focal symptoms.  Labs done today were reviewed in detail.  MEDICAL HISTORY:  Past Medical History:  Diagnosis Date   Asthma     SURGICAL HISTORY: Past Surgical History:  Procedure Laterality Date   BILIARY DILATION  07/13/2021   Procedure: BILIARY DILATION;  Surgeon: Clarene Essex, MD;  Location: [facility];  Service: Gastroenterology;;   BILIARY STENT PLACEMENT N/A 07/13/2021   Procedure: BILIARY STENT PLACEMENT;  Surgeon: Clarene Essex, MD;  Location: [name];  Service: Gastroenterology;  Laterality: N/A;   BIOPSY  07/13/2021   Procedure: BIOPSY;  Surgeon: Rush Landmark Telford Nab., MD;  Location: [name];  Service: Gastroenterology;;   ENDOSCOPIC RETROGRADE CHOLANGIOPANCREATOGRAPHY (ERCP) WITH PROPOFOL N/A 07/13/2021  Procedure: ENDOSCOPIC RETROGRADE CHOLANGIOPANCREATOGRAPHY (ERCP) WITH PROPOFOL;  Surgeon: Clarene Essex, MD;  Location: [name];  Service: Gastroenterology;  Laterality: N/A;    ESOPHAGOGASTRODUODENOSCOPY (EGD) WITH PROPOFOL N/A 07/13/2021   Procedure: ESOPHAGOGASTRODUODENOSCOPY (EGD) WITH PROPOFOL;  Surgeon: Rush Landmark Telford Nab., MD;  Location: [name];  Service: Gastroenterology;  Laterality: N/A;   EUS N/A 07/13/2021   Procedure: UPPER ENDOSCOPIC ULTRASOUND (EUS) LINEAR;  Surgeon: Irving Copas., MD;  Location: [name];  Service: Gastroenterology;  Laterality: N/A;   FINE NEEDLE ASPIRATION  07/13/2021   Procedure: FINE NEEDLE ASPIRATION (FNA) LINEAR;  Surgeon: Irving Copas., MD;  Location: [facility];  Service: Gastroenterology;;   IR IMAGING GUIDED PORT INSERTION  07/31/2021   SPHINCTEROTOMY  07/13/2021   Procedure: Joan Mayans;  Surgeon: Clarene Essex, MD;  Location: [name];  Service: Gastroenterology;;    SOCIAL HISTORY: Social History   Socioeconomic History   Marital status: Single    Spouse name: Not on file   Number of children: 0   Years of education: Not on file   Highest education level: Some college, no degree  Occupational History   Not on file  Tobacco Use   Smoking status: Some Days   Smokeless tobacco: Never  Vaping Use   Vaping Use: Never used  Substance and Sexual Activity   Alcohol use: Yes    Comment: social   Drug use: No   Sexual activity: Never  Other Topics Concern   Not on file  Social History Narrative   Not on file   Social Determinants of Health   Financial Resource Strain: Low Risk  (04/14/2017)   Overall Financial Resource Strain (CARDIA)    Difficulty of Paying Living Expenses: Not hard at all  Food Insecurity: No Food Insecurity (04/14/2017)   Hunger Vital Sign    Worried About Running Out of Food in the Last Year: Never true    Ran Out of Food in the Last Year: Never true  Transportation Needs: No Transportation Needs (04/14/2017)   PRAPARE - Hydrologist (Medical): No    Lack of Transportation (Non-Medical): No  Physical Activity: Inactive  (04/14/2017)   Exercise Vital Sign    Days of Exercise per Week: 0 days    Minutes of Exercise per Session: 0 min  Stress: Stress Concern Present (04/14/2017)   [place]    Feeling of Stress : Rather much  Social Connections: Moderately Isolated (04/14/2017)   Social Connection and Isolation Panel [NHANES]    Frequency of Communication with Friends and Family: More than three times a week    Frequency of Social Gatherings with Friends and Family: More than three times a week    Attends Religious Services: Never    Marine scientist or Organizations: No    Attends Archivist Meetings: Never    Marital Status: Never married  Intimate Partner Violence: Not At Risk (04/14/2017)   Humiliation, Afraid, Rape, and Kick questionnaire    Fear of Current or Ex-Partner: No    Emotionally Abused: No    Physically Abused: No    Sexually Abused: No    FAMILY HISTORY: Family History  Problem Relation Age of Onset   Drug abuse Father    ALLERGIES:  is allergic to shellfish allergy and other.  MEDICATIONS:  Current Outpatient Medications  Medication Sig Dispense Refill   albuterol (VENTOLIN HFA) 108 (90 Base) MCG/ACT inhaler Inhale 2 puffs into the  lungs every 4 (four) hours as needed for wheezing or shortness of breath. 18 g 0   dexamethasone (DECADRON) 4 MG tablet Take 2 tablets (8 mg total) by mouth daily. Start the day after chemotherapy for 1 day. 30 tablet 1   lidocaine-prilocaine (EMLA) cream Apply to affected area once 30 g 3   LORazepam (ATIVAN) 0.5 MG tablet Take 1 tablet (0.5 mg total) by mouth every 6 (six) hours as needed (Nausea or vomiting). 30 tablet 0   ondansetron (ZOFRAN) 8 MG tablet Take 1 tablet (8 mg total) by mouth 2 (two) times daily as needed for refractory nausea / vomiting. Start on day 3 after carboplatin chemo. 30 tablet 1   oxyCODONE (OXY IR/ROXICODONE) 5 MG immediate release tablet Take 1  tablet (5 mg total) by mouth every 6 (six) hours as needed for moderate pain or breakthrough pain. 12 tablet 0   pantoprazole (PROTONIX) 40 MG tablet Take 1 tablet (40 mg total) by mouth daily. 30 tablet 1   polyethylene glycol (MIRALAX / GLYCOLAX) 17 g packet Take 17 g by mouth daily. 14 each 0   prochlorperazine (COMPAZINE) 10 MG tablet Take 1 tablet (10 mg total) by mouth every 6 (six) hours as needed (Nausea or vomiting). 30 tablet 1   senna-docusate (SENOKOT-S) 8.6-50 MG tablet Take 2 tablets by mouth 2 (two) times daily. 30 tablet 0   No current facility-administered medications for this visit.    REVIEW OF SYSTEMS:    10 Point review of Systems was done is negative except as noted above.  PHYSICAL EXAMINATION: ECOG PERFORMANCE STATUS: 1 - Symptomatic but completely ambulatory  . There were no vitals filed for this visit.   .Body mass index is 44.97 kg/m. NADGENERAL:alert, in no acute distress and comfortable SKIN: no acute rashes, no significant lesions EYES: conjunctiva are pink and non-injected, sclera anicteric NECK: supple, no JVD LYMPH:  no palpable lymphadenopathy in the cervical, axillary or inguinal regions LUNGS: clear to auscultation b/l with normal respiratory effort HEART: regular rate & rhythm ABDOMEN:  normoactive bowel sounds , non tender, not distended. Extremity: no pedal edema PSYCH: alert & oriented x 3 with fluent speech NEURO: no focal motor/sensory deficits  LABORATORY DATA:  I have reviewed the data as listed  .    Latest Ref Rng & Units 10/09/2021    9:50 AM 09/17/2021    9:49 AM 08/21/2021    7:49 AM  CBC  WBC 4.0 - 10.5 K/uL 11.0  11.1  7.0   Hemoglobin 13.0 - 17.0 g/dL 13.2  12.1  12.2   Hematocrit 39.0 - 52.0 % 38.6  34.8  36.4   Platelets 150 - 400 K/uL 336  482  421     .    Latest Ref Rng & Units 10/09/2021    9:50 AM 09/17/2021    9:49 AM 08/21/2021    7:49 AM  CMP  Glucose 70 - 99 mg/dL 108  116  99   BUN 6 - 20 mg/dL '17  13   17   '$ Creatinine 0.61 - 1.24 mg/dL 0.96  0.80  0.83   Sodium 135 - 145 mmol/L 138  139  140   Potassium 3.5 - 5.1 mmol/L 3.7  3.7  3.7   Chloride 98 - 111 mmol/L 104  105  105   CO2 22 - 32 mmol/L '27  28  28   '$ Calcium 8.9 - 10.3 mg/dL 9.4  9.4  9.2   Total Protein 6.5 -  8.1 g/dL 7.6  7.8  7.1   Total Bilirubin 0.3 - 1.2 mg/dL 0.4  0.8  0.6   Alkaline Phos 38 - 126 U/L 130  234  157   AST 15 - 41 U/L 20  30  32   ALT 0 - 44 U/L 42  173  149    Cytology done 07/13/2021 revealed "FINAL MICROSCOPIC DIAGNOSIS:  A. PANCREAS, HEAD, FINE NEEDLE ASPIRATION:  - Malignant cells present  - Poorly differentiated/high-grade neuroendocrine carcinoma (see  comment)   B. CBD STRICTURE, BRUSHING:  - Atypical cells suspicious for tumor   C. BILIARY DILATION, BALLOON, REMOVAL:  - Atypical cells suspicious for tumor "  Surgical pathology done 07/13/2021 revealed "FINAL MICROSCOPIC DIAGNOSIS:   A. STOMACH, BIOPSY:  Reactive gastropathy and minimal chronic gastritis with lymphoid  aggregate  Negative for H. pylori, intestinal metaplasia, dysplasia and carcinoma"  RADIOGRAPHIC STUDIES: I have personally reviewed the radiological images as listed and agreed with the findings in the report. CT CHEST ABDOMEN PELVIS W CONTRAST  Result Date: 10/03/2021 CLINICAL DATA:  Metastatic pancreatic high-grade neuroendocrine tumor. * Tracking Code: BO * EXAM: CT CHEST, ABDOMEN, AND PELVIS WITH CONTRAST TECHNIQUE: Multidetector CT imaging of the chest, abdomen and pelvis was performed following the standard protocol during bolus administration of intravenous contrast. RADIATION DOSE REDUCTION: This exam was performed according to the departmental dose-optimization program which includes automated exposure control, adjustment of the mA and/or kV according to patient size and/or use of iterative reconstruction technique. CONTRAST:  177m OMNIPAQUE IOHEXOL 300 MG/ML  SOLN COMPARISON:  ERCP 07/13/2021, CT abdomen pelvis  07/11/2021, CT chest 07/03/2021, MR abdomen 07/02/2021. FINDINGS: CT CHEST FINDINGS Cardiovascular: Right IJ Port-A-Cath terminates in the right atrium. Heart is at the upper limits of normal in size. No pericardial effusion. Mediastinum/Nodes: No pathologically enlarged mediastinal, hilar or axillary lymph nodes. Esophagus is grossly unremarkable. Lungs/Pleura: Minimal scattered subsegmental volume loss. Lungs are otherwise clear. Calcified granuloma in the medial right lower lobe. No pleural fluid. Airway is unremarkable. Musculoskeletal: Degenerative changes in the spine. No worrisome lytic or sclerotic lesions. CT ABDOMEN PELVIS FINDINGS Hepatobiliary: Vague hypodense lesions in the liver appear grossly similar to 07/11/2021. Index lesion in the dome of the right hepatic lobe, 1.3 cm (2/43). Liver and gallbladder are otherwise unremarkable. A stent is seen in the lower common bile duct, terminating in the duodenum. No biliary ductal dilatation. Pancreas: Pancreatic head mass measures approximately 2.4 x 3.2 cm, similar. Pancreatic ductal dilatation and gland atrophy in association. Spleen: Negative. Adrenals/Urinary Tract: Adrenal glands and kidneys are unremarkable. Ureters are decompressed. Bladder is grossly unremarkable. Stomach/Bowel: Stomach, small bowel, appendix and colon are unremarkable. 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 There may be slight attenuation of the portal vein near the splenic portal confluence. Findings are similar to 07/11/2021. Peripancreatic adenopathy measures up to 1.6 cm (2/57), similar. Abdominal retroperitoneal adenopathy measures up to 1.6 cm posterior to the IVC, also similar. Gastrohepatic ligament and small bowel mesenteric lymph nodes are not enlarged by CT size criteria. Reproductive: Prostate is visualized. Other: No free fluid. Mesenteries and peritoneum are otherwise unremarkable. Musculoskeletal: Mild degenerative changes  in the spine. No worrisome lytic or sclerotic lesions. IMPRESSION: 1. Pancreatic mass and peripancreatic/retroperitoneal adenopathy, stable. 2. Vague hypodense lesions in the liver are grossly stable and thought to be metastatic on MR abdomen 07/02/2021. 3. Attenuation of the superior mesenteric vein, as before. Electronically Signed   By: MLorin PicketM.D.   On: 10/03/2021  11:24     ASSESSMENT & PLAN:   32 y.o. very pleasant male with  1. 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 -Cytology done 07/13/2021 shows presence of malignant cells in pancreas and poorly differentiated/high grade neuroendocrine carcinoma.  Plan -I discussed available labs with the patient in details Labs done today were reviewed in detail. CBC shows Hgb normal at 13.5, WBC count of 10.9k, and platelet count of 269k. CMP is stable. Liver enzymes elevated significantly with AST at 381 from 20 previously and ALT at 297 from 42 previously. -Patient's chart reviewed in detail -his CT chest, abd, pelvis done 10/02/2021 which revealed stable disease with no overt signs of progression. -patient with no significant toxicities from C3 of carboplatin/etoposide and okay to proceed with C4 of treatment. -will plan to get PET/CU-64 after 5 cycles of treatment to evaluate for residual disease activity and to determine octreotide receptor positivity to plan for subsequent treatments. -Due to his elevated liver enzymes we will hold treatment today and schedule him to get high resolution triphasic ct liver for further evaluation.  Follow up: CT liver protocol ASAP Phone visit with Dr Irene Limbo in 10 days   .The total time spent in the appointment was 25 minutes* .  All of the patient's questions were answered with apparent satisfaction. The patient knows to call the clinic with any problems, questions or concerns.   Sullivan Lone MD MS AAHIVMS  [facility] Hematology/Oncology Physician [facility]  .*Total Encounter Time as defined by the Centers for Medicare and Medicaid Services includes, in addition to the face-to-face time of a patient visit (documented in the note above) non-face-to-face time: obtaining and reviewing outside history, ordering and reviewing medications, tests or procedures, care coordination (communications with other health care professionals or caregivers) and documentation in the medical record.  I, Melene Muller, am acting as scribe for Dr. Sullivan Lone, MD.  .I have reviewed the above documentation for accuracy and completeness, and I agree with the above. [name] MD

## 2021-10-30 NOTE — Addendum Note (Signed)
Addended by: Shawn Stall on: 10/30/2021 12:30 PM   Modules accepted: Orders

## 2021-10-31 ENCOUNTER — Inpatient Hospital Stay: Payer: 59

## 2021-10-31 ENCOUNTER — Inpatient Hospital Stay: Payer: 59 | Admitting: Licensed Clinical Social Worker

## 2021-10-31 ENCOUNTER — Other Ambulatory Visit: Payer: Self-pay

## 2021-10-31 ENCOUNTER — Encounter: Payer: Self-pay | Admitting: Hematology

## 2021-10-31 DIAGNOSIS — C7A1 Malignant poorly differentiated neuroendocrine tumors: Secondary | ICD-10-CM

## 2021-10-31 NOTE — Progress Notes (Signed)
[place] Work  Clinical Social Work was referred by Art therapist for assessment of psychosocial needs.  Clinical Social Worker contacted caregiver by phone  to offer support and assess for needs.    Patient did not wish to speak with CSW and had his mother communicate for him.  [name] stated patient has not been able to work since May.  She is exploring financial resources for him.  She reports he is on FMLA from work.  Patient was medically unable to have chemotherapy today.  She would like CSW to mail the Disability Release form to her and she will return it.  Provided education regarding the disability process and working with the [facility] to assist in applying.  She stated she understood.  Also discussed the Lita Mains and CSW sent referral to Stefanie Libel, Patient Arboriculturist at [facility].  CSW provided contact information.      Margaree Mackintosh, LCSW  Clinical Social Worker [facility]

## 2021-10-31 NOTE — Progress Notes (Signed)
Received referral from social worker regarding patient needing assistance.  Called patient and mother [name] answered and asked patient to provide verbal consent for me to speak with her and he did. Introduced myself as Arboriculturist and to offer available resources. Discussed one-time $1000 Radio broadcast assistant to assist with personal expenses while going through treatment. Advised what is needed to apply and to contact me with next appointment date for me to note on appointment desk for registration to obtain documents needed to apply and have patient sign grant paperwork. She verbalized understanding. Also advised about possibly applying for Medicaid should patient become uninsured as she expressed.  She was given my contact name and number to provide information and for any additional financial questions or concerns.

## 2021-11-01 ENCOUNTER — Ambulatory Visit: Payer: 59

## 2021-11-01 ENCOUNTER — Encounter (HOSPITAL_COMMUNITY): Payer: Self-pay | Admitting: Gastroenterology

## 2021-11-02 ENCOUNTER — Encounter: Payer: Self-pay | Admitting: Hematology

## 2021-11-02 NOTE — Progress Notes (Signed)
This encounter was created in error - please disregard.

## 2021-11-05 ENCOUNTER — Encounter (HOSPITAL_COMMUNITY): Payer: Self-pay | Admitting: Gastroenterology

## 2021-11-05 ENCOUNTER — Encounter (HOSPITAL_COMMUNITY)
Admission: RE | Disposition: A | Discharge status: Home / Self Care | Payer: Self-pay | Source: Home / Self Care | Attending: Gastroenterology

## 2021-11-05 ENCOUNTER — Ambulatory Visit (HOSPITAL_COMMUNITY)
Admission: RE | Admit: 2021-11-05 | Discharge: 2021-11-05 | Disposition: A | Discharge status: Home / Self Care | Payer: 59 | Source: Home / Self Care | Attending: Gastroenterology | Admitting: Gastroenterology

## 2021-11-05 ENCOUNTER — Ambulatory Visit (HOSPITAL_BASED_OUTPATIENT_CLINIC_OR_DEPARTMENT_OTHER): Payer: 59 | Admitting: Certified Registered"

## 2021-11-05 ENCOUNTER — Ambulatory Visit (HOSPITAL_COMMUNITY): Payer: 59

## 2021-11-05 ENCOUNTER — Ambulatory Visit (HOSPITAL_COMMUNITY): Payer: 59 | Admitting: Certified Registered"

## 2021-11-05 ENCOUNTER — Encounter: Payer: Self-pay | Admitting: Hematology

## 2021-11-05 DIAGNOSIS — X58XXXA Exposure to other specified factors, initial encounter: Secondary | ICD-10-CM | POA: Insufficient documentation

## 2021-11-05 DIAGNOSIS — D490 Neoplasm of unspecified behavior of digestive system: Secondary | ICD-10-CM | POA: Insufficient documentation

## 2021-11-05 DIAGNOSIS — R748 Abnormal levels of other serum enzymes: Secondary | ICD-10-CM | POA: Insufficient documentation

## 2021-11-05 DIAGNOSIS — K8689 Other specified diseases of pancreas: Secondary | ICD-10-CM

## 2021-11-05 DIAGNOSIS — Z6841 Body Mass Index (BMI) 40.0 and over, adult: Secondary | ICD-10-CM

## 2021-11-05 DIAGNOSIS — K8050 Calculus of bile duct without cholangitis or cholecystitis without obstruction: Secondary | ICD-10-CM

## 2021-11-05 DIAGNOSIS — K810 Acute cholecystitis: Secondary | ICD-10-CM | POA: Diagnosis not present

## 2021-11-05 DIAGNOSIS — T85520A Displacement of bile duct prosthesis, initial encounter: Secondary | ICD-10-CM | POA: Insufficient documentation

## 2021-11-05 DIAGNOSIS — A419 Sepsis, unspecified organism: Secondary | ICD-10-CM | POA: Diagnosis not present

## 2021-11-05 DIAGNOSIS — K831 Obstruction of bile duct: Secondary | ICD-10-CM | POA: Insufficient documentation

## 2021-11-05 HISTORY — PX: ERCP: SHX5425

## 2021-11-05 HISTORY — PX: BILIARY STENT PLACEMENT: SHX5538

## 2021-11-05 SURGERY — ERCP, WITH INTERVENTION IF INDICATED
Anesthesia: General

## 2021-11-05 MED ORDER — CIPROFLOXACIN IN D5W 400 MG/200ML IV SOLN
INTRAVENOUS | Status: DC | PRN
  Administered 2021-11-05: 400 mg via INTRAVENOUS

## 2021-11-05 MED ORDER — SODIUM CHLORIDE 0.9 % IV SOLN: INTRAVENOUS | Status: DC

## 2021-11-05 MED ORDER — ROCURONIUM BROMIDE 10 MG/ML (PF) SYRINGE
PREFILLED_SYRINGE | INTRAVENOUS | Status: DC | PRN
  Administered 2021-11-05: 60 mg via INTRAVENOUS

## 2021-11-05 MED ORDER — GLUCAGON HCL RDNA (DIAGNOSTIC) 1 MG IJ SOLR
INTRAMUSCULAR | Status: AC
  Filled 2021-11-05: qty 2

## 2021-11-05 MED ORDER — ONDANSETRON HCL 4 MG/2ML IJ SOLN
INTRAMUSCULAR | Status: DC | PRN
  Administered 2021-11-05: 4 mg via INTRAVENOUS

## 2021-11-05 MED ORDER — FENTANYL CITRATE (PF) 250 MCG/5ML IJ SOLN
INTRAMUSCULAR | Status: DC | PRN
  Administered 2021-11-05 (×2): 50 ug via INTRAVENOUS

## 2021-11-05 MED ORDER — LIDOCAINE 2% (20 MG/ML) 5 ML SYRINGE
INTRAMUSCULAR | Status: DC | PRN
  Administered 2021-11-05: 60 mg via INTRAVENOUS

## 2021-11-05 MED ORDER — DICLOFENAC SUPPOSITORY 100 MG
RECTAL | Status: AC
  Filled 2021-11-05: qty 1

## 2021-11-05 MED ORDER — SODIUM CHLORIDE 0.9 % IV SOLN
INTRAVENOUS | Status: DC | PRN
  Administered 2021-11-05: 21 mL

## 2021-11-05 MED ORDER — DEXAMETHASONE SODIUM PHOSPHATE 10 MG/ML IJ SOLN
INTRAMUSCULAR | Status: DC | PRN
  Administered 2021-11-05: 4 mg via INTRAVENOUS

## 2021-11-05 MED ORDER — LACTATED RINGERS IV SOLN: INTRAVENOUS | Status: DC | PRN

## 2021-11-05 MED ORDER — FENTANYL CITRATE (PF) 100 MCG/2ML IJ SOLN
INTRAMUSCULAR | Status: AC
  Filled 2021-11-05: qty 2

## 2021-11-05 MED ORDER — CIPROFLOXACIN IN D5W 400 MG/200ML IV SOLN
INTRAVENOUS | Status: AC
  Filled 2021-11-05: qty 200

## 2021-11-05 MED ORDER — DICLOFENAC SUPPOSITORY 100 MG
RECTAL | Status: DC | PRN
  Administered 2021-11-05: 100 mg via RECTAL

## 2021-11-05 MED ORDER — SUGAMMADEX SODIUM 200 MG/2ML IV SOLN
INTRAVENOUS | Status: DC | PRN
  Administered 2021-11-05: 300 mg via INTRAVENOUS

## 2021-11-05 MED ORDER — PROPOFOL 10 MG/ML IV BOLUS
INTRAVENOUS | Status: DC | PRN
  Administered 2021-11-05: 200 mg via INTRAVENOUS

## 2021-11-05 NOTE — Discharge Instructions (Addendum)
Clear liquids only until 6:30 PM and if doing well may have soft solids do not drive today and if okay in a.m. call oncologist in the morning to reschedule chemotherapy and call me if any GI question or problems follow-up as needed  YOU HAD AN ENDOSCOPIC PROCEDURE TODAY: Refer to the procedure report and other information in the discharge instructions given to you for any specific questions about what was found during the examination. If this information does not answer your questions, please call [facility] office at [phone] to clarify.   YOU SHOULD EXPECT: Some feelings of bloating in the abdomen. Passage of more gas than usual. Walking can help get rid of the air that was put into your GI tract during the procedure and reduce the bloating.  DIET: Your first meal following the procedure should be a light meal and then it is ok to progress to your normal diet. A half-sandwich or bowl of soup is an example of a good first meal. Heavy or fried foods are harder to digest and may make you feel nauseous or bloated. Drink plenty of fluids but you should avoid alcoholic beverages for 24 hours.   ACTIVITY: Your care partner should take you home directly after the procedure. You should plan to take it easy, moving slowly for the rest of the day. You can resume normal activity the day after the procedure however YOU SHOULD NOT DRIVE, use power tools, machinery or perform tasks that involve climbing or major physical exertion for 24 hours (because of the sedation medicines used during the test).   SYMPTOMS TO REPORT IMMEDIATELY: A gastroenterologist can be reached at any hour. Please call [phone]  for any of the following symptoms:   Following upper endoscopy (EGD, EUS, ERCP, esophageal dilation) Vomiting of blood or coffee ground material  New, significant abdominal pain  New, significant chest pain or pain under the shoulder blades  Painful or persistently difficult swallowing  New shortness of  breath  Black, tarry-looking or red, bloody stools  FOLLOW UP:  If any biopsies were taken you will be contacted by phone or by letter within the next 1-3 weeks. Call [phone]  if you have not heard about the biopsies in 3 weeks.  Please also call with any specific questions about appointments or follow up tests. YOU HAD AN ENDOSCOPIC PROCEDURE TODAY: Refer to the procedure report and other information in the discharge instructions given to you for any specific questions about what was found during the examination. If this information does not answer your questions, please call [facility] office at [phone] to clarify.

## 2021-11-05 NOTE — Anesthesia Preprocedure Evaluation (Signed)
Anesthesia Evaluation  Patient identified by MRN, date of birth, ID band Patient awake    Reviewed: Allergy & Precautions, NPO status , Patient's Chart, lab work & pertinent test results  Airway Mallampati: II  TM Distance: >3 FB Neck ROM: Full    Dental no notable dental hx. (+) Teeth Intact, Dental Advisory Given   Pulmonary asthma , Current Smoker and Patient abstained from smoking., former smoker,    Pulmonary exam normal breath sounds clear to auscultation       Cardiovascular negative cardio ROS Normal cardiovascular exam Rhythm:Regular Rate:Normal     Neuro/Psych negative neurological ROS  negative psych ROS   GI/Hepatic negative GI ROS, Neg liver ROS, Pancreatic mass   Endo/Other  Morbid obesity  Renal/GU negative Renal ROS  negative genitourinary   Musculoskeletal negative musculoskeletal ROS (+)   Abdominal   Peds negative pediatric ROS (+)  Hematology negative hematology ROS (+)   Anesthesia Other Findings   Reproductive/Obstetrics negative OB ROS                             Anesthesia Physical  Anesthesia Plan  ASA: 3  Anesthesia Plan: General   Post-op Pain Management: Minimal or no pain anticipated   Induction: Intravenous  PONV Risk Score and Plan: 2 and Ondansetron and Dexamethasone  Airway Management Planned: Oral ETT  Additional Equipment: None  Intra-op Plan:   Post-operative Plan: Extubation in OR  Informed Consent: I have reviewed the patients History and Physical, chart, labs and discussed the procedure including the risks, benefits and alternatives for the proposed anesthesia with the patient or authorized representative who has indicated his/her understanding and acceptance.       Plan Discussed with: Anesthesiologist and CRNA  Anesthesia Plan Comments: (  )        Anesthesia Quick Evaluation

## 2021-11-05 NOTE — Anesthesia Procedure Notes (Signed)
Procedure Name: Intubation Date/Time: 11/05/2021 12:06 PM  Performed by: Eben Burow, CRNAPre-anesthesia Checklist: Patient identified, Emergency Drugs available, Suction available, Patient being monitored and Timeout performed Patient Re-evaluated:Patient Re-evaluated prior to induction Oxygen Delivery Method: Circle system utilized Preoxygenation: Pre-oxygenation with 100% oxygen Induction Type: IV induction Ventilation: Mask ventilation without difficulty Laryngoscope Size: Mac and 4 Grade View: Grade I Tube type: Oral Tube size: 7.5 mm Number of attempts: 1 Airway Equipment and Method: Stylet Placement Confirmation: ETT inserted through vocal cords under direct vision, positive ETCO2 and breath sounds checked- equal and bilateral Secured at: 23 cm Tube secured with: Tape Dental Injury: Teeth and Oropharynx as per pre-operative assessment

## 2021-11-05 NOTE — Transfer of Care (Signed)
Immediate Anesthesia Transfer of Care Note  Patient: Leon Fischer.  Procedure(s) Performed: ENDOSCOPIC RETROGRADE CHOLANGIOPANCREATOGRAPHY (ERCP) BILIARY STENT PLACEMENT REMOVAL OF STONES  Patient Location: PACU and Endoscopy Unit  Anesthesia Type:General  Level of Consciousness: awake, alert  and patient cooperative  Airway & Oxygen Therapy: Patient Spontanous Breathing and Patient connected to face mask oxygen  Post-op Assessment: Report given to RN and Post -op Vital signs reviewed and stable  Post vital signs: Reviewed and stable  Last Vitals:  Vitals Value Taken Time  BP 160/110 11/05/21 1256  Temp 36.6 C 11/05/21 1257  Pulse 95 11/05/21 1257  Resp 16 11/05/21 1257  SpO2 100 % 11/05/21 1257  Vitals shown include unvalidated device data.  Last Pain:  Vitals:   11/05/21 1257  TempSrc: Temporal  PainSc:          Complications: No notable events documented.

## 2021-11-05 NOTE — Op Note (Signed)
[facility] Patient Name: Leon Fischer Procedure Date: 11/05/2021 MRN: [id number] Attending MD: Clarene Essex , MD Date of Birth: 1990/12/18 CSN: [id number] Age: 31 Admit Type: Inpatient Procedure:                ERCP Indications:              Elevated liver enzymes, Tumor involving entire                            pancreas biliary stricture Providers:                Clarene Essex, MD, Dulcy Fanny, William Dalton,                            Technician Referring MD:              Medicines:                General Anesthesia Complications:            No immediate complications. Estimated Blood Loss:     Estimated blood loss: none. Procedure:                Pre-Anesthesia Assessment:                           - Prior to the procedure, a History and Physical                            was performed, and patient medications and                            allergies were reviewed. The patient's tolerance of                            previous anesthesia was also reviewed. The risks                            and benefits of the procedure and the sedation                            options and risks were discussed with the patient.                            All questions were answered, and informed consent                            was obtained. Prior Anticoagulants: The patient has                            taken no previous anticoagulant or antiplatelet                            agents. ASA Grade Assessment: II - A patient with                            mild systemic disease. After reviewing  the risks                            and benefits, the patient was deemed in                            satisfactory condition to undergo the procedure.                           After obtaining informed consent, the scope was                            passed under direct vision. Throughout the                            procedure, the patient's blood pressure, pulse, and                             oxygen saturations were monitored continuously. The                            [id number]-Q190V ([id number]) Olympus duodenoscope was                            introduced through the mouth, and used to inject                            contrast into and used to cannulate the bile duct.                            The ERCP was accomplished without difficulty. The                            patient tolerated the procedure well. Scope In: Scope Out: Findings:      One stents which had been placed through the major papilla into the       biliary tree were no longer visible. They had migrated out of the duct.       The major papilla was normal. Initially we thought we had deep selective       cannulation using the sphincterotome loaded with the wire but possibly       the wire was advanced towards the pancreas so the sphincterotome and       wire were repositioned and A short 0.035 inch Soft Jagwire passed       successfully into the left main hepatic duct. Dye was injected with the       sphincterotome and the obvious stricture was confirmed and to discover       objects better delineate the stricture, the biliary tree was swept with       a 9 mm balloon starting at the bifurcation. Nothing was found. The       stricture was nicely seen on fluoroscopy which was short in the mid duct       and based on the plastic stent migrating sometime in September since it       was present on the August CT we  elected to place one 10 Fr by 6 cm       covered metal biliary stent with no external flaps and no internal flaps       was placed 5.5 cm into the common bile duct. Bile flowed through the       stent. The stent was in good position. On occlusion cholangiogram we did       not see any cystic duct filling or gallbladder filling so elected to       place the covered metal stent and there was no pancreatic duct injection       throughout the procedure Impression:               - A previously  placed stent had migrated out of the                            biliary tree.                           - The major papilla appeared normal.                           - The biliary tree was swept and nothing was found.                           - One covered metal biliary stent was placed into                            the common bile duct. Moderate Sedation:      Not Applicable - Patient had care per Anesthesia. Recommendation:           - Clear liquid diet for 6 hours.                           - Continue present medications.                           - Return to GI clinic PRN. Stent change as needed                           - Telephone GI clinic if symptomatic PRN. Call your                            oncologist tomorrow if doing well to reschedule lab                            tests and chemotherapy Procedure Code(s):        --- Professional ---                           [phone], Endoscopic retrograde                            cholangiopancreatography (ERCP); with removal and                            exchange of stent(s),  biliary or pancreatic duct,                            including pre- and post-dilation and guide wire                            passage, when performed, including sphincterotomy,                            when performed, each stent exchanged Diagnosis Code(s):        --- Professional ---                           J94.174Y, Displacement of bile duct prosthesis,                            initial encounter                           R74.8, Abnormal levels of other serum enzymes                           D49.0, Neoplasm of unspecified behavior of                            digestive system CPT copyright 2019 [employer]. All rights reserved. The codes documented in this report are preliminary and upon coder review may  be revised to meet current compliance requirements. Clarene Essex, MD 11/05/2021 1:02:04 PM This report has been signed  electronically. Number of Addenda: 0

## 2021-11-06 NOTE — Anesthesia Postprocedure Evaluation (Signed)
Anesthesia Post Note  Patient: Leon Fischer.  Procedure(s) Performed: ENDOSCOPIC RETROGRADE CHOLANGIOPANCREATOGRAPHY (ERCP) BILIARY STENT PLACEMENT REMOVAL OF STONES     Patient location during evaluation: PACU Anesthesia Type: General Level of consciousness: awake and alert Pain management: pain level controlled Vital Signs Assessment: post-procedure vital signs reviewed and stable Respiratory status: spontaneous breathing, nonlabored ventilation, respiratory function stable and patient connected to nasal cannula oxygen Cardiovascular status: blood pressure returned to baseline and stable Postop Assessment: no apparent nausea or vomiting Anesthetic complications: no   No notable events documented.  Last Vitals:  Vitals:   11/05/21 1317 11/05/21 1326  BP: (!) 149/90 (!) 154/90  Pulse: 90 83  Resp: 14 16  Temp:    SpO2: 95% 96%    Last Pain:  Vitals:   11/05/21 1326  TempSrc:   PainSc: 0-No pain   Pain Goal:                   Tiajuana Amass

## 2021-11-07 ENCOUNTER — Other Ambulatory Visit: Payer: Self-pay

## 2021-11-07 ENCOUNTER — Encounter (HOSPITAL_COMMUNITY): Payer: Self-pay | Admitting: Certified Registered Nurse Anesthetist

## 2021-11-07 ENCOUNTER — Inpatient Hospital Stay (HOSPITAL_COMMUNITY): Payer: 59 | Admitting: Certified Registered Nurse Anesthetist

## 2021-11-07 ENCOUNTER — Inpatient Hospital Stay (HOSPITAL_COMMUNITY)
Admission: EM | Admit: 2021-11-07 | Discharge: 2021-11-09 | DRG: 853 | Disposition: A | Discharge status: Home / Self Care | Payer: 59 | Attending: Internal Medicine | Admitting: Internal Medicine

## 2021-11-07 ENCOUNTER — Emergency Department (HOSPITAL_COMMUNITY): Payer: 59

## 2021-11-07 ENCOUNTER — Encounter (HOSPITAL_COMMUNITY)
Admission: EM | Disposition: A | Discharge status: Home / Self Care | Payer: Self-pay | Source: Home / Self Care | Attending: Internal Medicine

## 2021-11-07 ENCOUNTER — Encounter (HOSPITAL_COMMUNITY): Payer: Self-pay | Admitting: *Deleted

## 2021-11-07 ENCOUNTER — Encounter (HOSPITAL_COMMUNITY)
Admission: EM | Disposition: A | Discharge status: Home / Self Care | Payer: 59 | Source: Home / Self Care | Attending: Internal Medicine

## 2021-11-07 DIAGNOSIS — E876 Hypokalemia: Secondary | ICD-10-CM | POA: Diagnosis present

## 2021-11-07 DIAGNOSIS — R112 Nausea with vomiting, unspecified: Secondary | ICD-10-CM | POA: Diagnosis not present

## 2021-11-07 DIAGNOSIS — Z79899 Other long term (current) drug therapy: Secondary | ICD-10-CM

## 2021-11-07 DIAGNOSIS — R0682 Tachypnea, not elsewhere classified: Secondary | ICD-10-CM | POA: Diagnosis present

## 2021-11-07 DIAGNOSIS — R591 Generalized enlarged lymph nodes: Secondary | ICD-10-CM | POA: Diagnosis present

## 2021-11-07 DIAGNOSIS — F17200 Nicotine dependence, unspecified, uncomplicated: Secondary | ICD-10-CM | POA: Diagnosis present

## 2021-11-07 DIAGNOSIS — Z91013 Allergy to seafood: Secondary | ICD-10-CM

## 2021-11-07 DIAGNOSIS — C259 Malignant neoplasm of pancreas, unspecified: Secondary | ICD-10-CM | POA: Diagnosis present

## 2021-11-07 DIAGNOSIS — Z813 Family history of other psychoactive substance abuse and dependence: Secondary | ICD-10-CM | POA: Diagnosis not present

## 2021-11-07 DIAGNOSIS — T85520A Displacement of bile duct prosthesis, initial encounter: Secondary | ICD-10-CM | POA: Diagnosis present

## 2021-11-07 DIAGNOSIS — J45909 Unspecified asthma, uncomplicated: Secondary | ICD-10-CM

## 2021-11-07 DIAGNOSIS — K660 Peritoneal adhesions (postprocedural) (postinfection): Secondary | ICD-10-CM | POA: Diagnosis present

## 2021-11-07 DIAGNOSIS — K810 Acute cholecystitis: Secondary | ICD-10-CM

## 2021-11-07 DIAGNOSIS — C7B8 Other secondary neuroendocrine tumors: Secondary | ICD-10-CM | POA: Diagnosis present

## 2021-11-07 DIAGNOSIS — Z6841 Body Mass Index (BMI) 40.0 and over, adult: Secondary | ICD-10-CM

## 2021-11-07 DIAGNOSIS — Y828 Other medical devices associated with adverse incidents: Secondary | ICD-10-CM | POA: Diagnosis present

## 2021-11-07 DIAGNOSIS — C787 Secondary malignant neoplasm of liver and intrahepatic bile duct: Secondary | ICD-10-CM | POA: Diagnosis present

## 2021-11-07 DIAGNOSIS — Z9221 Personal history of antineoplastic chemotherapy: Secondary | ICD-10-CM | POA: Diagnosis not present

## 2021-11-07 DIAGNOSIS — K831 Obstruction of bile duct: Secondary | ICD-10-CM | POA: Diagnosis present

## 2021-11-07 DIAGNOSIS — C7A1 Malignant poorly differentiated neuroendocrine tumors: Secondary | ICD-10-CM | POA: Diagnosis present

## 2021-11-07 DIAGNOSIS — R1011 Right upper quadrant pain: Principal | ICD-10-CM

## 2021-11-07 DIAGNOSIS — K812 Acute cholecystitis with chronic cholecystitis: Secondary | ICD-10-CM | POA: Diagnosis present

## 2021-11-07 DIAGNOSIS — A419 Sepsis, unspecified organism: Secondary | ICD-10-CM | POA: Diagnosis present

## 2021-11-07 DIAGNOSIS — K8689 Other specified diseases of pancreas: Secondary | ICD-10-CM | POA: Diagnosis present

## 2021-11-07 HISTORY — DX: Other malignant neuroendocrine tumors: C7A.8

## 2021-11-07 HISTORY — PX: CHOLECYSTECTOMY: SHX55

## 2021-11-07 LAB — CBC WITH DIFFERENTIAL/PLATELET
Abs Immature Granulocytes: 0.6 10*3/uL — ABNORMAL HIGH (ref 0.00–0.07)
Basophils Absolute: 0.1 10*3/uL (ref 0.0–0.1)
Basophils Relative: 0 %
Eosinophils Absolute: 0 10*3/uL (ref 0.0–0.5)
Eosinophils Relative: 0 %
HCT: 38.7 % — ABNORMAL LOW (ref 39.0–52.0)
Hemoglobin: 12.9 g/dL — ABNORMAL LOW (ref 13.0–17.0)
Immature Granulocytes: 1 %
Lymphocytes Relative: 1 %
Lymphs Abs: 0.6 10*3/uL — ABNORMAL LOW (ref 0.7–4.0)
MCH: 31.9 pg (ref 26.0–34.0)
MCHC: 33.3 g/dL (ref 30.0–36.0)
MCV: 95.6 fL (ref 80.0–100.0)
Monocytes Absolute: 4.1 10*3/uL — ABNORMAL HIGH (ref 0.1–1.0)
Monocytes Relative: 9 %
Neutro Abs: 38.2 10*3/uL — ABNORMAL HIGH (ref 1.7–7.7)
Neutrophils Relative %: 89 %
Platelets: 345 10*3/uL (ref 150–400)
RBC: 4.05 MIL/uL — ABNORMAL LOW (ref 4.22–5.81)
RDW: 14.2 % (ref 11.5–15.5)
WBC: 43.5 10*3/uL — ABNORMAL HIGH (ref 4.0–10.5)
nRBC: 0 % (ref 0.0–0.2)

## 2021-11-07 LAB — COMPREHENSIVE METABOLIC PANEL
ALT: 116 U/L — ABNORMAL HIGH (ref 0–44)
AST: 21 U/L (ref 15–41)
Albumin: 4.2 g/dL (ref 3.5–5.0)
Alkaline Phosphatase: 115 U/L (ref 38–126)
Anion gap: 9 (ref 5–15)
BUN: 9 mg/dL (ref 6–20)
CO2: 23 mmol/L (ref 22–32)
Calcium: 9 mg/dL (ref 8.9–10.3)
Chloride: 101 mmol/L (ref 98–111)
Creatinine, Ser: 0.88 mg/dL (ref 0.61–1.24)
GFR, Estimated: >60 mL/min (ref >60)
Glucose, Bld: 145 mg/dL — ABNORMAL HIGH (ref 70–99)
Potassium: 3.3 mmol/L — ABNORMAL LOW (ref 3.5–5.1)
Sodium: 133 mmol/L — ABNORMAL LOW (ref 135–145)
Total Bilirubin: 1.6 mg/dL — ABNORMAL HIGH (ref 0.3–1.2)
Total Protein: 7.9 g/dL (ref 6.5–8.1)

## 2021-11-07 LAB — MAGNESIUM: Magnesium: 1.6 mg/dL — ABNORMAL LOW (ref 1.7–2.4)

## 2021-11-07 LAB — LACTIC ACID, PLASMA
Lactic Acid, Venous: 0.9 mmol/L (ref 0.5–1.9)
Lactic Acid, Venous: 2.2 mmol/L (ref 0.5–1.9)

## 2021-11-07 LAB — LIPASE, BLOOD: Lipase: 34 U/L (ref 11–51)

## 2021-11-07 LAB — PHOSPHORUS: Phosphorus: 3.8 mg/dL (ref 2.5–4.6)

## 2021-11-07 SURGERY — EGD (ESOPHAGOGASTRODUODENOSCOPY)
Anesthesia: Monitor Anesthesia Care

## 2021-11-07 SURGERY — LAPAROSCOPIC CHOLECYSTECTOMY WITH INTRAOPERATIVE CHOLANGIOGRAM
Anesthesia: General

## 2021-11-07 MED ORDER — MIDAZOLAM HCL 2 MG/2ML IJ SOLN
INTRAMUSCULAR | Status: AC
  Filled 2021-11-07: qty 2

## 2021-11-07 MED ORDER — ONDANSETRON HCL 4 MG/2ML IJ SOLN
INTRAMUSCULAR | Status: DC | PRN
  Administered 2021-11-07: 4 mg via INTRAVENOUS

## 2021-11-07 MED ORDER — PIPERACILLIN-TAZOBACTAM 3.375 G IVPB 30 MIN: 3.3750 g | Freq: Three times a day (TID) | INTRAVENOUS | Status: DC

## 2021-11-07 MED ORDER — AMISULPRIDE (ANTIEMETIC) 5 MG/2ML IV SOLN: 10.0000 mg | Freq: Once | INTRAVENOUS | Status: DC | PRN

## 2021-11-07 MED ORDER — POTASSIUM CHLORIDE IN NACL 20-0.9 MEQ/L-% IV SOLN
INTRAVENOUS | Status: DC
  Filled 2021-11-07 (×4): qty 1000

## 2021-11-07 MED ORDER — MIDAZOLAM HCL 5 MG/5ML IJ SOLN
INTRAMUSCULAR | Status: DC | PRN
  Administered 2021-11-07: 2 mg via INTRAVENOUS

## 2021-11-07 MED ORDER — LACTATED RINGERS IV BOLUS
1000.0000 mL | Freq: Once | INTRAVENOUS | Status: AC
  Administered 2021-11-07: 1000 mL via INTRAVENOUS

## 2021-11-07 MED ORDER — PANTOPRAZOLE SODIUM 40 MG IV SOLR
40.0000 mg | Freq: Every day | INTRAVENOUS | Status: DC
  Administered 2021-11-07 – 2021-11-08 (×2): 40 mg via INTRAVENOUS
  Filled 2021-11-07 (×2): qty 10

## 2021-11-07 MED ORDER — MORPHINE SULFATE (PF) 2 MG/ML IV SOLN: 2.0000 mg | INTRAVENOUS | Status: DC | PRN

## 2021-11-07 MED ORDER — HYDROMORPHONE HCL 1 MG/ML IJ SOLN: 0.2500 mg | INTRAMUSCULAR | Status: DC | PRN

## 2021-11-07 MED ORDER — ACETAMINOPHEN 10 MG/ML IV SOLN
INTRAVENOUS | Status: DC | PRN
  Administered 2021-11-07: 1000 mg via INTRAVENOUS

## 2021-11-07 MED ORDER — PIPERACILLIN-TAZOBACTAM 3.375 G IVPB
3.3750 g | Freq: Three times a day (TID) | INTRAVENOUS | Status: DC
  Administered 2021-11-08 – 2021-11-09 (×5): 3.375 g via INTRAVENOUS
  Filled 2021-11-07 (×5): qty 50

## 2021-11-07 MED ORDER — MAGNESIUM SULFATE 2 GM/50ML IV SOLN
2.0000 g | Freq: Once | INTRAVENOUS | Status: AC
  Administered 2021-11-07: 2 g via INTRAVENOUS
  Filled 2021-11-07: qty 50

## 2021-11-07 MED ORDER — HYDROMORPHONE HCL 1 MG/ML IJ SOLN
0.5000 mg | INTRAMUSCULAR | Status: DC | PRN
  Administered 2021-11-08: 0.5 mg via INTRAVENOUS
  Administered 2021-11-08: 1 mg via INTRAVENOUS
  Filled 2021-11-07 (×2): qty 1

## 2021-11-07 MED ORDER — HYDROMORPHONE HCL 2 MG/ML IJ SOLN
INTRAMUSCULAR | Status: AC
  Filled 2021-11-07: qty 1

## 2021-11-07 MED ORDER — PROPOFOL 10 MG/ML IV BOLUS
INTRAVENOUS | Status: AC
  Filled 2021-11-07: qty 20

## 2021-11-07 MED ORDER — MORPHINE SULFATE (PF) 4 MG/ML IV SOLN
4.0000 mg | Freq: Once | INTRAVENOUS | Status: AC
  Administered 2021-11-07: 4 mg via INTRAVENOUS
  Filled 2021-11-07: qty 1

## 2021-11-07 MED ORDER — PROPOFOL 10 MG/ML IV BOLUS
INTRAVENOUS | Status: DC | PRN
  Administered 2021-11-07: 200 mg via INTRAVENOUS

## 2021-11-07 MED ORDER — MEPERIDINE HCL 50 MG/ML IJ SOLN: 6.2500 mg | INTRAMUSCULAR | Status: DC | PRN

## 2021-11-07 MED ORDER — 0.9 % SODIUM CHLORIDE (POUR BTL) OPTIME
TOPICAL | Status: DC | PRN
  Administered 2021-11-07: 1000 mL

## 2021-11-07 MED ORDER — BUPIVACAINE-EPINEPHRINE (PF) 0.25% -1:200000 IJ SOLN
INTRAMUSCULAR | Status: AC
  Filled 2021-11-07: qty 30

## 2021-11-07 MED ORDER — DEXAMETHASONE SODIUM PHOSPHATE 4 MG/ML IJ SOLN
INTRAMUSCULAR | Status: DC | PRN
  Administered 2021-11-07: 10 mg via INTRAVENOUS

## 2021-11-07 MED ORDER — SODIUM CHLORIDE 0.9 % IV BOLUS
1000.0000 mL | Freq: Once | INTRAVENOUS | Status: AC
  Administered 2021-11-07: 1000 mL via INTRAVENOUS

## 2021-11-07 MED ORDER — OXYCODONE HCL 5 MG PO TABS
5.0000 mg | ORAL_TABLET | ORAL | Status: DC | PRN
  Administered 2021-11-08: 5 mg via ORAL
  Administered 2021-11-09: 10 mg via ORAL
  Filled 2021-11-07: qty 1
  Filled 2021-11-07: qty 2

## 2021-11-07 MED ORDER — METHOCARBAMOL 1000 MG/10ML IJ SOLN: 500.0000 mg | Freq: Four times a day (QID) | INTRAVENOUS | Status: DC | PRN

## 2021-11-07 MED ORDER — HYDROMORPHONE HCL 1 MG/ML IJ SOLN
INTRAMUSCULAR | Status: DC | PRN
  Administered 2021-11-07 (×2): .5 mg via INTRAVENOUS

## 2021-11-07 MED ORDER — DOCUSATE SODIUM 100 MG PO CAPS
100.0000 mg | ORAL_CAPSULE | Freq: Two times a day (BID) | ORAL | Status: DC
  Administered 2021-11-07 – 2021-11-09 (×4): 100 mg via ORAL
  Filled 2021-11-07 (×4): qty 1

## 2021-11-07 MED ORDER — ONDANSETRON HCL 4 MG/2ML IJ SOLN
4.0000 mg | Freq: Once | INTRAMUSCULAR | Status: AC
  Administered 2021-11-07: 4 mg via INTRAVENOUS
  Filled 2021-11-07: qty 2

## 2021-11-07 MED ORDER — FENTANYL CITRATE (PF) 250 MCG/5ML IJ SOLN
INTRAMUSCULAR | Status: AC
  Filled 2021-11-07: qty 5

## 2021-11-07 MED ORDER — PIPERACILLIN-TAZOBACTAM 3.375 G IVPB 30 MIN
3.3750 g | Freq: Once | INTRAVENOUS | Status: AC
  Administered 2021-11-07: 3.375 g via INTRAVENOUS
  Filled 2021-11-07: qty 50

## 2021-11-07 MED ORDER — POTASSIUM CHLORIDE CRYS ER 20 MEQ PO TBCR
20.0000 meq | EXTENDED_RELEASE_TABLET | Freq: Four times a day (QID) | ORAL | Status: AC
  Administered 2021-11-07 (×2): 20 meq via ORAL
  Filled 2021-11-07 (×2): qty 1

## 2021-11-07 MED ORDER — SUCCINYLCHOLINE CHLORIDE 200 MG/10ML IV SOSY
PREFILLED_SYRINGE | INTRAVENOUS | Status: DC | PRN
  Administered 2021-11-07: 140 mg via INTRAVENOUS

## 2021-11-07 MED ORDER — LACTATED RINGERS IR SOLN
Status: DC | PRN
  Administered 2021-11-07: 1000 mL

## 2021-11-07 MED ORDER — SODIUM CHLORIDE 0.9 % IV SOLN: INTRAVENOUS | Status: DC

## 2021-11-07 MED ORDER — OXYCODONE HCL 5 MG PO TABS: 5.0000 mg | ORAL_TABLET | Freq: Once | ORAL | Status: DC | PRN

## 2021-11-07 MED ORDER — IBUPROFEN 800 MG PO TABS
800.0000 mg | ORAL_TABLET | Freq: Once | ORAL | Status: AC
  Administered 2021-11-07: 800 mg via ORAL
  Filled 2021-11-07: qty 1

## 2021-11-07 MED ORDER — ESMOLOL HCL 100 MG/10ML IV SOLN
INTRAVENOUS | Status: AC
  Filled 2021-11-07: qty 10

## 2021-11-07 MED ORDER — OXYCODONE HCL 5 MG/5ML PO SOLN: 5.0000 mg | Freq: Once | ORAL | Status: DC | PRN

## 2021-11-07 MED ORDER — ROCURONIUM BROMIDE 10 MG/ML (PF) SYRINGE
PREFILLED_SYRINGE | INTRAVENOUS | Status: DC | PRN
  Administered 2021-11-07: 80 mg via INTRAVENOUS
  Administered 2021-11-07: 20 mg via INTRAVENOUS

## 2021-11-07 MED ORDER — BUPIVACAINE-EPINEPHRINE 0.25% -1:200000 IJ SOLN
INTRAMUSCULAR | Status: DC | PRN
  Administered 2021-11-07: 17.5 mL

## 2021-11-07 MED ORDER — IOHEXOL 350 MG/ML SOLN
100.0000 mL | Freq: Once | INTRAVENOUS | Status: AC | PRN
  Administered 2021-11-07: 100 mL via INTRAVENOUS

## 2021-11-07 MED ORDER — LACTATED RINGERS IV SOLN: INTRAVENOUS | Status: DC

## 2021-11-07 MED ORDER — PROMETHAZINE HCL 25 MG/ML IJ SOLN: 6.2500 mg | INTRAMUSCULAR | Status: DC | PRN

## 2021-11-07 MED ORDER — METOCLOPRAMIDE HCL 5 MG/ML IJ SOLN
10.0000 mg | Freq: Once | INTRAMUSCULAR | Status: AC
  Administered 2021-11-07: 10 mg via INTRAVENOUS
  Filled 2021-11-07: qty 2

## 2021-11-07 MED ORDER — HYDROMORPHONE HCL 1 MG/ML IJ SOLN
1.0000 mg | Freq: Once | INTRAMUSCULAR | Status: AC
  Administered 2021-11-07: 1 mg via INTRAVENOUS
  Filled 2021-11-07: qty 1

## 2021-11-07 MED ORDER — FENTANYL CITRATE (PF) 100 MCG/2ML IJ SOLN
INTRAMUSCULAR | Status: DC | PRN
  Administered 2021-11-07: 50 ug via INTRAVENOUS
  Administered 2021-11-07: 100 ug via INTRAVENOUS
  Administered 2021-11-07 (×2): 50 ug via INTRAVENOUS

## 2021-11-07 MED ORDER — LACTATED RINGERS IV BOLUS: 1000.0000 mL | Freq: Once | INTRAVENOUS | Status: DC

## 2021-11-07 MED ORDER — ACETAMINOPHEN 10 MG/ML IV SOLN
INTRAVENOUS | Status: AC
  Filled 2021-11-07: qty 100

## 2021-11-07 MED ORDER — SODIUM CHLORIDE (PF) 0.9 % IJ SOLN
INTRAMUSCULAR | Status: AC
  Filled 2021-11-07: qty 50

## 2021-11-07 MED ORDER — ACETAMINOPHEN 325 MG PO TABS: 650.0000 mg | ORAL_TABLET | Freq: Four times a day (QID) | ORAL | Status: DC | PRN

## 2021-11-07 MED ORDER — BUPIVACAINE LIPOSOME 1.3 % IJ SUSP
INTRAMUSCULAR | Status: AC
  Filled 2021-11-07: qty 20

## 2021-11-07 MED ORDER — BUPIVACAINE LIPOSOME 1.3 % IJ SUSP
INTRAMUSCULAR | Status: DC | PRN
  Administered 2021-11-07: 17.5 mL

## 2021-11-07 MED ORDER — LIDOCAINE 2% (20 MG/ML) 5 ML SYRINGE
INTRAMUSCULAR | Status: DC | PRN
  Administered 2021-11-07: 100 mg via INTRAVENOUS

## 2021-11-07 MED ORDER — CHLORHEXIDINE GLUCONATE CLOTH 2 % EX PADS: 6.0000 | MEDICATED_PAD | Freq: Every day | CUTANEOUS | Status: DC

## 2021-11-07 MED ORDER — SUGAMMADEX SODIUM 200 MG/2ML IV SOLN
INTRAVENOUS | Status: DC | PRN
  Administered 2021-11-07: 400 mg via INTRAVENOUS

## 2021-11-07 MED ORDER — CHLORHEXIDINE GLUCONATE 0.12 % MT SOLN
15.0000 mL | Freq: Once | OROMUCOSAL | Status: AC
  Administered 2021-11-07: 15 mL via OROMUCOSAL

## 2021-11-07 SURGICAL SUPPLY — 55 items
ADH SKN CLS APL DERMABOND .7 (GAUZE/BANDAGES/DRESSINGS) ×1
APL PRP STRL LF DISP 70% ISPRP (MISCELLANEOUS) ×1
APL SKNCLS STERI-STRIP NONHPOA (GAUZE/BANDAGES/DRESSINGS)
APPLIER CLIP ROT 10 11.4 M/L (STAPLE) ×1
APR CLP MED LRG 11.4X10 (STAPLE) ×1
BAG COUNTER SPONGE SURGICOUNT (BAG) ×1 IMPLANT
BAG SPEC RTRVL LRG 6X4 10 (ENDOMECHANICALS) ×1
BAG SPNG CNTER NS LX DISP (BAG) ×1
BENZOIN TINCTURE PRP APPL 2/3 (GAUZE/BANDAGES/DRESSINGS) IMPLANT
BNDG ADH 1X3 SHEER STRL LF (GAUZE/BANDAGES/DRESSINGS) IMPLANT
BNDG ADH THN 3X1 STRL LF (GAUZE/BANDAGES/DRESSINGS)
CABLE HIGH FREQUENCY MONO STRZ (ELECTRODE) ×1 IMPLANT
CHLORAPREP W/TINT 26 (MISCELLANEOUS) ×1 IMPLANT
CLIP APPLIE ROT 10 11.4 M/L (STAPLE) ×1 IMPLANT
COVER MAYO STAND XLG (MISCELLANEOUS) IMPLANT
COVER SURGICAL LIGHT HANDLE (MISCELLANEOUS) ×1 IMPLANT
DERMABOND ADVANCED .7 DNX12 (GAUZE/BANDAGES/DRESSINGS) IMPLANT
DRAIN CHANNEL 19F RND (DRAIN) IMPLANT
DRAPE C-ARM 42X120 X-RAY (DRAPES) IMPLANT
ELECT REM PT RETURN 15FT ADLT (MISCELLANEOUS) ×1 IMPLANT
ENDOLOOP SUT PDS II  0 18 (SUTURE) ×2
ENDOLOOP SUT PDS II 0 18 (SUTURE) IMPLANT
EVACUATOR DRAINAGE 10X20 100CC (DRAIN) IMPLANT
EVACUATOR SILICONE 100CC (DRAIN) ×1
GLOVE BIO SURGEON STRL SZ 6 (GLOVE) ×1 IMPLANT
GLOVE INDICATOR 6.5 STRL GRN (GLOVE) ×1 IMPLANT
GLOVE SS BIOGEL STRL SZ 6 (GLOVE) ×1 IMPLANT
GOWN STRL REUS W/ TWL LRG LVL3 (GOWN DISPOSABLE) ×1 IMPLANT
GOWN STRL REUS W/ TWL XL LVL3 (GOWN DISPOSABLE) IMPLANT
GOWN STRL REUS W/TWL LRG LVL3 (GOWN DISPOSABLE) ×1
GOWN STRL REUS W/TWL XL LVL3 (GOWN DISPOSABLE)
GRASPER SUT TROCAR 14GX15 (MISCELLANEOUS) IMPLANT
HEMOSTAT SNOW SURGICEL 2X4 (HEMOSTASIS) IMPLANT
IRRIG SUCT STRYKERFLOW 2 WTIP (MISCELLANEOUS) ×1
IRRIGATION SUCT STRKRFLW 2 WTP (MISCELLANEOUS) ×1 IMPLANT
KIT BASIN OR (CUSTOM PROCEDURE TRAY) ×1 IMPLANT
KIT TURNOVER KIT A (KITS) IMPLANT
NDL INSUFFLATION 14GA 120MM (NEEDLE) ×1 IMPLANT
NEEDLE INSUFFLATION 14GA 120MM (NEEDLE) ×1 IMPLANT
POUCH SPECIMEN RETRIEVAL 10MM (ENDOMECHANICALS) ×1 IMPLANT
SCISSORS LAP 5X35 DISP (ENDOMECHANICALS) ×1 IMPLANT
SET CHOLANGIOGRAPH MIX (MISCELLANEOUS) IMPLANT
SET TUBE SMOKE EVAC HIGH FLOW (TUBING) ×1 IMPLANT
SLEEVE Z-THREAD 5X100MM (TROCAR) ×2 IMPLANT
SPIKE FLUID TRANSFER (MISCELLANEOUS) ×1 IMPLANT
SPONGE DRAIN TRACH 4X4 STRL 2S (GAUZE/BANDAGES/DRESSINGS) IMPLANT
STRIP CLOSURE SKIN 1/2X4 (GAUZE/BANDAGES/DRESSINGS) IMPLANT
SUT ETHILON 2 0 PS N (SUTURE) IMPLANT
SUT MNCRL AB 4-0 PS2 18 (SUTURE) ×1 IMPLANT
TOWEL OR 17X26 10 PK STRL BLUE (TOWEL DISPOSABLE) ×1 IMPLANT
TOWEL OR NON WOVEN STRL DISP B (DISPOSABLE) IMPLANT
TRAY LAPAROSCOPIC (CUSTOM PROCEDURE TRAY) ×1 IMPLANT
TROCAR ADV FIXATION 12X100MM (TROCAR) ×1 IMPLANT
TROCAR XCEL NON-BLD 5MMX100MML (ENDOMECHANICALS) IMPLANT
TROCAR Z-THREAD OPTICAL 5X100M (TROCAR) ×1 IMPLANT

## 2021-11-07 NOTE — Progress Notes (Signed)
[facility] admitting physician addendum:  Lactic acid level is 2.2 mmol/L.  LR 1000 mL bolus ordered earlier was held since the patient was going to the OR.  LR bolus 1000 mL now ordered.  Potassium has also been supplemented.  Tennis Must, MD.

## 2021-11-07 NOTE — Progress Notes (Signed)
Patient came to Short Stay with accessed port-a-cath. Patient allowed for one attempt at PIV access. PIV failed, catheter unable to advance. Dr. Sabra Heck w/ anesthesia notified. OK to proceed with Port a cath at this time.  Patient port flushed, blood return noted, and IV fluids attached.  Anesthesia also notified that patient temp is 100.0.

## 2021-11-07 NOTE — Consult Note (Addendum)
Referring Provider: [facility] Primary Care Physician:  Patient, No Pcp Per Primary Gastroenterologist:  Dr. Watt Climes  Reason for Consultation:  abdominal pain after ERCP with stent placement   HPI: Leon Fischer. is a 31 y.o. male with past medical history of asthma and high-grade metastatic pancreatic neuroendocrine tumor.  She recently underwent ERCP with biliary stent placement 11/05/2021.   Patient reports starting last night he had sudden onset right upper quadrant pain.  He had associated nausea and vomiting that was nonbloody nonbilious.  He had 2-3 episodes of vomiting.  Denies melena, hematochezia.  When the pain started he was resting.  He was eating a soft diet at home.  Denies fevers, chills.  With severity of pain and nausea and vomiting he decided to present to the emergency room for further evaluation.  On presentation to the ED, patient had significant leukocytosis of 43.5, hemoglobin 12.9, NA 133, K3.3, AST/ALT improved from 10/30/2021 now at 21/116, T. bili 1.6, lipase 34, lactic acid 0.9.  CT abdomen pelvis with contrast ordered.  Patient was previously seen with [facility].   ERCP 07/13/2021 with Dr. Kathlene Cote Distal CBD stricture treated by balloon dilation and placed biliary stent.   ERCP 11/05/2021 with Dr. Watt Climes Easily placed stent migrated out of biliary tree, 1 covered metal biliary stent placed into the CBD   Past Medical History:  Diagnosis Date   Asthma    Neuroendocrine cancer [facility])     Past Surgical History:  Procedure Laterality Date   BILIARY DILATION  07/13/2021   Procedure: BILIARY DILATION;  Surgeon: Clarene Essex, MD;  Location: [facility];  Service: Gastroenterology;;   BILIARY STENT PLACEMENT N/A 07/13/2021   Procedure: BILIARY STENT PLACEMENT;  Surgeon: Clarene Essex, MD;  Location: [name];  Service: Gastroenterology;  Laterality: N/A;   BIOPSY  07/13/2021   Procedure: BIOPSY;  Surgeon: Rush Landmark Telford Nab., MD;  Location: [name];  Service:  Gastroenterology;;   ENDOSCOPIC RETROGRADE CHOLANGIOPANCREATOGRAPHY (ERCP) WITH PROPOFOL N/A 07/13/2021   Procedure: ENDOSCOPIC RETROGRADE CHOLANGIOPANCREATOGRAPHY (ERCP) WITH PROPOFOL;  Surgeon: Clarene Essex, MD;  Location: [name];  Service: Gastroenterology;  Laterality: N/A;   ESOPHAGOGASTRODUODENOSCOPY (EGD) WITH PROPOFOL N/A 07/13/2021   Procedure: ESOPHAGOGASTRODUODENOSCOPY (EGD) WITH PROPOFOL;  Surgeon: Rush Landmark Telford Nab., MD;  Location: [name];  Service: Gastroenterology;  Laterality: N/A;   EUS N/A 07/13/2021   Procedure: UPPER ENDOSCOPIC ULTRASOUND (EUS) LINEAR;  Surgeon: Irving Copas., MD;  Location: [name];  Service: Gastroenterology;  Laterality: N/A;   FINE NEEDLE ASPIRATION  07/13/2021   Procedure: FINE NEEDLE ASPIRATION (FNA) LINEAR;  Surgeon: Irving Copas., MD;  Location: [facility];  Service: Gastroenterology;;   IR IMAGING GUIDED PORT INSERTION  07/31/2021   SPHINCTEROTOMY  07/13/2021   Procedure: Joan Mayans;  Surgeon: Clarene Essex, MD;  Location: [name];  Service: Gastroenterology;;    Prior to Admission medications   Medication Sig Start Date End Date Taking? Authorizing Provider  albuterol (VENTOLIN HFA) 108 (90 Base) MCG/ACT inhaler Inhale 2 puffs into the lungs every 4 (four) hours as needed for wheezing or shortness of breath. 10/30/21   [name], MD  dexamethasone (DECADRON) 4 MG tablet Take 2 tablets (8 mg total) by mouth daily. Start the day after chemotherapy for 1 day. 09/17/21   [name], MD  lidocaine-prilocaine (EMLA) cream Apply to affected area once 07/23/21   [name], MD  LORazepam (ATIVAN) 0.5 MG tablet Take 1 tablet (0.5 mg total) by mouth every 6 (six) hours as  needed (Nausea or vomiting). 07/23/21   [name], MD  ondansetron (ZOFRAN) 8 MG tablet Take 1 tablet (8 mg total) by mouth 2 (two) times daily as needed for refractory nausea / vomiting. Start on day 3 after  carboplatin chemo. 07/23/21   [name], MD  pantoprazole (PROTONIX) 40 MG tablet Take 1 tablet (40 mg total) by mouth daily. Patient not taking: Reported on 11/02/2021 07/05/21 07/05/22  Regalado, Jerald Kief A, MD  polyethylene glycol (MIRALAX / GLYCOLAX) 17 g packet Take 17 g by mouth daily. Patient taking differently: Take 17 g by mouth daily as needed for mild constipation or moderate constipation. 07/05/21   Regalado, Belkys A, MD  prochlorperazine (COMPAZINE) 10 MG tablet Take 1 tablet (10 mg total) by mouth every 6 (six) hours as needed (Nausea or vomiting). 07/23/21   [name], MD  senna-docusate (SENOKOT-S) 8.6-50 MG tablet Take 2 tablets by mouth 2 (two) times daily. Patient taking differently: Take 2 tablets by mouth daily as needed for mild constipation or moderate constipation. 07/05/21   Regalado, Belkys A, MD    Scheduled Meds: Continuous Infusions: PRN Meds:.  Allergies as of 11/07/2021 - Review Complete 11/07/2021  Allergen Reaction Noted   Shellfish allergy Swelling and Shortness Of Breath 05/28/2011   Other  08/10/2012    Family History  Problem Relation Age of Onset   Drug abuse Father     Social History   Socioeconomic History   Marital status: Single    Spouse name: Not on file   Number of children: 0   Years of education: Not on file   Highest education level: Some college, no degree  Occupational History   Not on file  Tobacco Use   Smoking status: Some Days   Smokeless tobacco: Never  Vaping Use   Vaping Use: Never used  Substance and Sexual Activity   Alcohol use: Yes    Comment: social   Drug use: No   Sexual activity: Never  Other Topics Concern   Not on file  Social History Narrative   Not on file   Social Determinants of Health   Financial Resource Strain: Low Risk  (04/14/2017)   Overall Financial Resource Strain (CARDIA)    Difficulty of Paying Living Expenses: Not hard at all  Food Insecurity: No Food Insecurity  (04/14/2017)   Hunger Vital Sign    Worried About Running Out of Food in the Last Year: Never true    Ran Out of Food in the Last Year: Never true  Transportation Needs: No Transportation Needs (04/14/2017)   PRAPARE - Hydrologist (Medical): No    Lack of Transportation (Non-Medical): No  Physical Activity: Inactive (04/14/2017)   Exercise Vital Sign    Days of Exercise per Week: 0 days    Minutes of Exercise per Session: 0 min  Stress: Stress Concern Present (04/14/2017)   [place]    Feeling of Stress : Rather much  Social Connections: Moderately Isolated (04/14/2017)   Social Connection and Isolation Panel [NHANES]    Frequency of Communication with Friends and Family: More than three times a week    Frequency of Social Gatherings with Friends and Family: More than three times a week    Attends Religious Services: Never    Marine scientist or Organizations: No    Attends Archivist Meetings: Never    Marital Status: Never married  Intimate Partner Violence: Not At Risk (04/14/2017)   Humiliation, Afraid, Rape, and Kick questionnaire    Fear of Current or Ex-Partner: No    Emotionally Abused: No    Physically Abused: No    Sexually Abused: No    Review of Systems: All negative except as stated above in HPI.  Physical Exam:Physical Exam Constitutional:      General: He is not in acute distress.    Appearance: He is obese.  HENT:     Head: Normocephalic and atraumatic.     Right Ear: External ear normal.     Left Ear: External ear normal.     Nose: Nose normal.     Mouth/Throat:     Mouth: Mucous membranes are moist.  Eyes:     General: No scleral icterus.    Pupils: Pupils are equal, round, and reactive to light.  Cardiovascular:     Rate and Rhythm: Regular rhythm. Tachycardia present.     Pulses: Normal pulses.     Heart sounds: Normal heart sounds.   Pulmonary:     Effort: Pulmonary effort is normal.     Breath sounds: Normal breath sounds.  Abdominal:     General: Bowel sounds are normal. There is no distension.     Palpations: Abdomen is soft. There is no mass.     Tenderness: There is abdominal tenderness (RUQ). There is guarding. There is no rebound.     Hernia: No hernia is present.  Musculoskeletal:        General: Normal range of motion.     Cervical back: Normal range of motion and neck supple.  Skin:    General: Skin is warm and dry.     Coloration: Skin is not jaundiced or pale.  Neurological:     General: No focal deficit present.     Mental Status: He is alert and oriented to person, place, and time. Mental status is at baseline.  Psychiatric:        Mood and Affect: Mood normal.        Behavior: Behavior normal.     Vital signs: Vitals:   11/07/21 0800 11/07/21 0900  BP: 139/78 111/79  Pulse: (!) 132 (!) 131  Resp: 11 20  Temp:    SpO2: 94% 96%        GI:  Lab Results: Recent Labs    11/07/21 0754  WBC 43.5*  HGB 12.9*  HCT 38.7*  PLT 345   BMET Recent Labs    11/07/21 0754  NA 133*  K 3.3*  CL 101  CO2 23  GLUCOSE 145*  BUN 9  CREATININE 0.88  CALCIUM 9.0   LFT Recent Labs    11/07/21 0754  PROT 7.9  ALBUMIN 4.2  AST 21  ALT 116*  ALKPHOS 115  BILITOT 1.6*   PT/INR No results for input(s): "LABPROT", "INR" in the last 72 hours.   Studies/Results: DG Chest 1 View  Result Date: 11/07/2021 CLINICAL DATA:  Shortness of breath. Pain under RIGHT breast. Status post stent placement to pancreas on Monday. EXAM: CHEST  1 VIEW COMPARISON:  None Available. FINDINGS: Heart size and mediastinal contours are within normal limits. RIGHT chest wall Port-A-Cath in place with tip positioned at the level of the RIGHT atrium. Lungs are clear. No pleural effusion or pneumothorax is seen. IMPRESSION: No active disease. No evidence of pneumonia or pulmonary edema. Electronically Signed   By:  Franki Cabot M.D.   On: 11/07/2021 08:23  DG ERCP  Result Date: 11/05/2021 CLINICAL DATA:  History high-grade metastatic pancreatic neuroendocrine tumor EXAM: ERCP TECHNIQUE: Multiple spot images obtained with the fluoroscopic device and submitted for interpretation post-procedure. FLUOROSCOPY TIME: FLUOROSCOPY TIME 122 mGy COMPARISON.: CT the chest, abdomen pelvis-10/02/2021 FINDINGS: Twelve spot intraoperative fluoroscopic images of the right upper abdominal quadrant during ERCP are provided for review. Initial image demonstrates an ERCP probe overlying the right upper abdominal quadrant. There has been interval removal of internal biliary stent. Subsequent images demonstrate selective cannulation and opacification of the CBD with persistent narrowing/subtotal occlusion at its mid aspect (image 9) Subsequent images demonstrate insufflation of a balloon with in the distal aspect of the CBD Completion images demonstrate placement of a internal biliary stent traversing the area of narrowing/subtotal occlusion of the mid aspect of the CBD. IMPRESSION: ERCP with biliary stent placement as above. These images were submitted for radiologic interpretation only. Please see the procedural report for the amount of contrast and the fluoroscopy time utilized. Electronically Signed   By: Sandi Mariscal M.D.   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  Lab work with significant leukocytosis concerning for possible infection.  T. bili elevated.  Concern for possible stent blockage or movement.  Patient is tachycardic with significant right upper quadrant pain. CT scan significant for acute cholecystitis.  Patient has known biliary stricture and will need  biliary stent in place.  Consider having surgical evaluation for cholecystectomy prior to consideration of removal biliary stent.  CT AP 10/4 Post CBD stenting with gallbladder wall thickening and significant pericholecystic edema highly concerning for acute cholecystitis, biliary leak considered unlikely  WBC 43.5, hemoglobin 12.9, NA 133, K3.3, AST/ALT improved from 10/30/2021 now at 21/116, T. bili 1.6, lipase 34, lactic acid 0.9.   Was given 1 dose of Zosyn 3.375 g over 30 minutes.  Plan: Consider general surgery consult for evaluation of possible laparoscopic cholecystectomy for patient with acute cholecystitis and leukocytosis.  Keep NPO Continue anti-emetics and supportive care as needed. [facility] will follow.     LOS: 0 days   Charlott Rakes  PA-C 11/07/2021, 9:36 AM  Contact #  [phone]

## 2021-11-07 NOTE — H&P (Signed)
History and Physical    Patient: Leon Fischer. ZOX:[id number] DOB: 08-01-1990 DOA: 11/07/2021 DOS: the patient was seen and examined on 11/07/2021 PCP: Patient, No Pcp Per  Patient coming from: Home  Chief Complaint:  Chief Complaint  Patient presents with   Post-op Problem   HPI: Leon Ripp. is a 31 y.o. male with medical history significant of Asthma, class III obesity with a BMI of 44.76 kg/m, pancreatic neuroendocrine metastatic tumor with recent chemotherapy session held due to elevated LFTs who underwent an ERCP with biliary stent placement 2 days ago who is coming to the emergency department with acute onset of RUQ pain since yesterday evening associated with an episode of small emesis followed by severe dry heaving.  Patient stated that the pain felt so intense as if "Lorine Bears punched him in the liver".  He has felt some palpitations and mild lightheadedness. He denied fever, chills, rhinorrhea, sore throat, wheezing or hemoptysis.  No chest pain, palpitations, diaphoresis, PND, orthopnea or pitting edema of the lower extremities.  No abdominal pain, nausea, emesis, diarrhea, constipation, melena or hematochezia.  No flank pain, dysuria, frequency or hematuria.  No polyuria, polydipsia, polyphagia or blurred vision.   ED course: Initial vital signs were temperature 99.8 F, pulse 138, respirations 32, BP 149/91 mmHg O2 sat 95% on room air.  The patient received 1000 mL of normal saline bolus, metoclopramide 10 mg IVP, morphine 4 mg IVP, hydromorphone 1 mg IVP, ibuprofen 800 mg p.o., Zosyn 3.375 g IVPB and Zofran 4 mg IVP.  Lab work: CBC showed a white count 43.5, hemoglobin 12.9 g/dL platelets 345.  CMP with a potassium of 3.3 mmol/L, ALT of 116, glucose of 145 and total bilirubin of 1.6 mg/dL.  Lipase was normal.  Normal lactic acid as well.  The rest of the CMP measurements were normal after sodium was corrected.  Imaging: Portable 1 view chest radiograph with  no active evidence of pneumonia.  CTA chest with no PE, but IV contrast enhancement was suboptimal.  CT abdomen/pelvis with no acute cholecystitis, 1 new hepatic metastasis and 3 previous known lung increased in size.  Pancreatic head/uncinate process mass is stable.  Questionable right pyelonephritis.  Please see images and full radiology report for further details.   Review of Systems: As mentioned in the history of present illness. All other systems reviewed and are negative.  Past Medical History:  Diagnosis Date   Asthma    Neuroendocrine cancer [facility])    Past Surgical History:  Procedure Laterality Date   BILIARY DILATION  07/13/2021   Procedure: BILIARY DILATION;  Surgeon: Clarene Essex, MD;  Location: [facility];  Service: Gastroenterology;;   BILIARY STENT PLACEMENT N/A 07/13/2021   Procedure: BILIARY STENT PLACEMENT;  Surgeon: Clarene Essex, MD;  Location: [name];  Service: Gastroenterology;  Laterality: N/A;   BIOPSY  07/13/2021   Procedure: BIOPSY;  Surgeon: Rush Landmark Telford Nab., MD;  Location: [name];  Service: Gastroenterology;;   ENDOSCOPIC RETROGRADE CHOLANGIOPANCREATOGRAPHY (ERCP) WITH PROPOFOL N/A 07/13/2021   Procedure: ENDOSCOPIC RETROGRADE CHOLANGIOPANCREATOGRAPHY (ERCP) WITH PROPOFOL;  Surgeon: Clarene Essex, MD;  Location: [name];  Service: Gastroenterology;  Laterality: N/A;   ESOPHAGOGASTRODUODENOSCOPY (EGD) WITH PROPOFOL N/A 07/13/2021   Procedure: ESOPHAGOGASTRODUODENOSCOPY (EGD) WITH PROPOFOL;  Surgeon: Rush Landmark Telford Nab., MD;  Location: [name];  Service: Gastroenterology;  Laterality: N/A;   EUS N/A 07/13/2021   Procedure: UPPER ENDOSCOPIC ULTRASOUND (EUS) LINEAR;  Surgeon: Irving Copas., MD;  Location: [name];  Service: Gastroenterology;  Laterality: N/A;   FINE NEEDLE ASPIRATION  07/13/2021   Procedure: FINE NEEDLE ASPIRATION (FNA) LINEAR;  Surgeon: Irving Copas., MD;  Location: [facility];  Service: Gastroenterology;;    IR IMAGING GUIDED PORT INSERTION  07/31/2021   SPHINCTEROTOMY  07/13/2021   Procedure: Joan Mayans;  Surgeon: Clarene Essex, MD;  Location: [name];  Service: Gastroenterology;;   Social History:  reports that he has been smoking. He has never used smokeless tobacco. He reports current alcohol use. He reports that he does not use drugs.  Allergies  Allergen Reactions   Shellfish Allergy Swelling and Shortness Of Breath    Eyes, throat swell shut.    Other     Family History  Problem Relation Age of Onset   Drug abuse Father     Prior to Admission medications   Medication Sig Start Date End Date Taking? Authorizing Provider  albuterol (VENTOLIN HFA) 108 (90 Base) MCG/ACT inhaler Inhale 2 puffs into the lungs every 4 (four) hours as needed for wheezing or shortness of breath. 10/30/21   [name], MD  dexamethasone (DECADRON) 4 MG tablet Take 2 tablets (8 mg total) by mouth daily. Start the day after chemotherapy for 1 day. 09/17/21   [name], MD  lidocaine-prilocaine (EMLA) cream Apply to affected area once 07/23/21   [name], MD  LORazepam (ATIVAN) 0.5 MG tablet Take 1 tablet (0.5 mg total) by mouth every 6 (six) hours as needed (Nausea or vomiting). 07/23/21   [name], MD  ondansetron (ZOFRAN) 8 MG tablet Take 1 tablet (8 mg total) by mouth 2 (two) times daily as needed for refractory nausea / vomiting. Start on day 3 after carboplatin chemo. 07/23/21   [name], MD  pantoprazole (PROTONIX) 40 MG tablet Take 1 tablet (40 mg total) by mouth daily. Patient not taking: Reported on 11/02/2021 07/05/21 07/05/22  Regalado, Jerald Kief A, MD  polyethylene glycol (MIRALAX / GLYCOLAX) 17 g packet Take 17 g by mouth daily. Patient taking differently: Take 17 g by mouth daily as needed for mild constipation or moderate constipation. 07/05/21   Regalado, Belkys A, MD  prochlorperazine (COMPAZINE) 10 MG tablet Take 1 tablet (10 mg total) by mouth  every 6 (six) hours as needed (Nausea or vomiting). 07/23/21   [name], MD  senna-docusate (SENOKOT-S) 8.6-50 MG tablet Take 2 tablets by mouth 2 (two) times daily. Patient taking differently: Take 2 tablets by mouth daily as needed for mild constipation or moderate constipation. 07/05/21   Elmarie Shiley, MD    Physical Exam: Vitals:   11/07/21 1003 11/07/21 1040 11/07/21 1100 11/07/21 1200  BP: 113/81 (!) 141/88 120/78 121/83  Pulse: (!) 123 (!) 130 (!) 121 (!) 126  Resp: (!) 22 15 (!) 25 (!) 24  Temp:    99 F (37.2 C)  TempSrc:    Oral  SpO2: 96% 98% 94% 96%  Weight:      Height:       Physical Exam Vitals and nursing note reviewed.  Constitutional:      General: He is awake. He is not in acute distress.    Appearance: Normal appearance. He is obese. He is not ill-appearing.  HENT:     Head: Normocephalic.     Nose: No rhinorrhea.     Mouth/Throat:     Mouth: Mucous membranes are dry.  Eyes:     General: No scleral icterus.    Pupils: Pupils are equal, round, and  reactive to light.  Neck:     Vascular: No JVD.  Cardiovascular:     Rate and Rhythm: Normal rate and regular rhythm.     Heart sounds: S1 normal and S2 normal.  Pulmonary:     Effort: Pulmonary effort is normal.     Breath sounds: Normal breath sounds. No wheezing, rhonchi or rales.  Abdominal:     General: Bowel sounds are normal. There is no distension.     Palpations: Abdomen is soft.     Tenderness: There is abdominal tenderness in the right upper quadrant and epigastric area. There is no guarding or rebound.  Musculoskeletal:     Cervical back: Neck supple.     Right lower leg: No edema.     Left lower leg: No edema.  Skin:    General: Skin is warm and dry.  Neurological:     General: No focal deficit present.     Mental Status: He is alert and oriented to person, place, and time.  Psychiatric:        Mood and Affect: Mood normal.        Behavior: Behavior normal. Behavior is  cooperative.    Data Reviewed:  Results are pending, will review when available.  Assessment and Plan: Principal Problem:   Acute cholecystitis Admit to PCU/inpatient. Diet per surgery orders. Analgesics as needed. Antiemetics as needed. Continue Zosyn 3.375 g IVPB every 8 hours. Surgical consul intervention appreciated.  Active Problems:   Asthma No signs of decompensation. Supplemental oxygen and bronchodilators as needed.    Malignant poorly differentiated neuroendocrine carcinoma [facility]) Follow-up with oncology as scheduled. Last chemotherapy session was held due to abnormal LFTs.    Hypomagnesemia Replacement ordered. Follow level as needed.    Class 3 obesity Current BMI 44.76 kg/m. Continue lifestyle modifications. Follow-up with PCP.    Advance Care Planning:   Code Status: Prior   Consults: General surgery (Dr. Kae Heller) and [facility] (Dr. Therisa Doyne).  Family Communication:   Severity of Illness: The appropriate patient status for this patient is INPATIENT. Inpatient status is judged to be reasonable and necessary in order to provide the required intensity of service to ensure the patient's safety. The patient's presenting symptoms, physical exam findings, and initial radiographic and laboratory data in the context of their chronic comorbidities is felt to place them at high risk for further clinical deterioration. Furthermore, it is not anticipated that the patient will be medically stable for discharge from the hospital within 2 midnights of admission.   * I certify that at the point of admission it is my clinical judgment that the patient will require inpatient hospital care spanning beyond 2 midnights from the point of admission due to high intensity of service, high risk for further deterioration and high frequency of surveillance required.*  Author: Reubin Milan, MD 11/07/2021 12:14 PM  For on call review [email or web address].   This document was prepared  using Dragon voice recognition software and may contain some unintended transcription errors.

## 2021-11-07 NOTE — Anesthesia Preprocedure Evaluation (Addendum)
Anesthesia Evaluation  Patient identified by MRN, date of birth, ID band Patient awake    Reviewed: Allergy & Precautions, NPO status , Patient's Chart, lab work & pertinent test results  Airway Mallampati: II  TM Distance: >3 FB Neck ROM: Full    Dental no notable dental hx. (+) Teeth Intact, Dental Advisory Given   Pulmonary asthma , Current Smoker, former smoker,    Pulmonary exam normal breath sounds clear to auscultation       Cardiovascular negative cardio ROS Normal cardiovascular exam Rhythm:Regular Rate:Normal     Neuro/Psych negative neurological ROS  negative psych ROS   GI/Hepatic negative GI ROS, Neg liver ROS, Pancreatic mass   Endo/Other  Morbid obesity  Renal/GU negative Renal ROS  negative genitourinary   Musculoskeletal negative musculoskeletal ROS (+)   Abdominal (+) + obese,   Peds negative pediatric ROS (+)  Hematology negative hematology ROS (+)   Anesthesia Other Findings Neuroendocrine Cancer  Reproductive/Obstetrics negative OB ROS                            Anesthesia Physical  Anesthesia Plan  ASA: 3 and emergent  Anesthesia Plan: General   Post-op Pain Management: Dilaudid IV and Ofirmev IV (intra-op)*   Induction: Intravenous, Rapid sequence and Cricoid pressure planned  PONV Risk Score and Plan: 2 and Ondansetron, Midazolam and Treatment may vary due to age or medical condition  Airway Management Planned: Oral ETT  Additional Equipment: None  Intra-op Plan:   Post-operative Plan: Extubation in OR  Informed Consent: I have reviewed the patients History and Physical, chart, labs and discussed the procedure including the risks, benefits and alternatives for the proposed anesthesia with the patient or authorized representative who has indicated his/her understanding and acceptance.       Plan Discussed with: Anesthesiologist and  CRNA  Anesthesia Plan Comments: (  )       Anesthesia Quick Evaluation

## 2021-11-07 NOTE — ED Notes (Signed)
Pt is aware that a urine sample is needed.  

## 2021-11-07 NOTE — Transfer of Care (Signed)
Immediate Anesthesia Transfer of Care Note  Patient: Leon Fischer.  Procedure(s) Performed: LAPAROSCOPIC CHOLECYSTECTOMY  Patient Location: PACU  Anesthesia Type:General  Level of Consciousness: awake, alert , oriented and patient cooperative  Airway & Oxygen Therapy: Patient Spontanous Breathing and Patient connected to face mask oxygen  Post-op Assessment: Report given to RN and Post -op Vital signs reviewed and stable  Post vital signs: Reviewed and stable  Last Vitals:  Vitals Value Taken Time  BP 136/83 11/07/21 1638  Temp 37.2 C 11/07/21 1638  Pulse 122 11/07/21 1640  Resp 21 11/07/21 1640  SpO2 95 % 11/07/21 1640  Vitals shown include unvalidated device data.  Last Pain:  Vitals:   11/07/21 1400  TempSrc: Oral  PainSc: 7       Patients Stated Pain Goal: 0 (06/34/94 9447)  Complications: No notable events documented.

## 2021-11-07 NOTE — ED Provider Notes (Signed)
[facility] DEPT Provider Note   CSN: [id number] Arrival date & time: 11/07/21  3762     History Neuroendocrine carcinoma Chief Complaint  Patient presents with   Post-op Problem    Leon Fischer. is a 31 y.o. male.  31 year old male with a past medical history of neuroendocrine cancer, status post ERCP with stent placement 2 days ago since to the ED with sudden onset of right upper quadrant pain last night.  Patient describes the pain as severe, like "Carleene Mains is punching me in the abdomen", exacerbated with deep inspiration, there is no alleviating factors.  He did not take anything for pain control.  In addition, he is endorsing nausea, vomiting.  Patient is currently under the care of Dr. Irene Limbo, he is receiving chemotherapy has completed 4 rounds. ERCP was performed by Dr. Watt Climes. He is febrile with a low temperature of 99.8 on arrival to the ED. Patient also endorsing shortness of breath attributed to the pain.  No chest pain, no diarrhea, no other complaints.   The history is provided by the patient and medical records.       Home Medications Prior to Admission medications   Medication Sig Start Date End Date Taking? Authorizing Provider  albuterol (VENTOLIN HFA) 108 (90 Base) MCG/ACT inhaler Inhale 2 puffs into the lungs every 4 (four) hours as needed for wheezing or shortness of breath. 10/30/21   [name], MD  dexamethasone (DECADRON) 4 MG tablet Take 2 tablets (8 mg total) by mouth daily. Start the day after chemotherapy for 1 day. 09/17/21   [name], MD  lidocaine-prilocaine (EMLA) cream Apply to affected area once 07/23/21   [name], MD  LORazepam (ATIVAN) 0.5 MG tablet Take 1 tablet (0.5 mg total) by mouth every 6 (six) hours as needed (Nausea or vomiting). 07/23/21   [name], MD  ondansetron (ZOFRAN) 8 MG tablet Take 1 tablet (8 mg total) by mouth 2 (two) times daily as needed for  refractory nausea / vomiting. Start on day 3 after carboplatin chemo. 07/23/21   [name], MD  pantoprazole (PROTONIX) 40 MG tablet Take 1 tablet (40 mg total) by mouth daily. Patient not taking: Reported on 11/02/2021 07/05/21 07/05/22  Regalado, Jerald Kief A, MD  polyethylene glycol (MIRALAX / GLYCOLAX) 17 g packet Take 17 g by mouth daily. Patient taking differently: Take 17 g by mouth daily as needed for mild constipation or moderate constipation. 07/05/21   Regalado, Belkys A, MD  prochlorperazine (COMPAZINE) 10 MG tablet Take 1 tablet (10 mg total) by mouth every 6 (six) hours as needed (Nausea or vomiting). 07/23/21   [name], MD  senna-docusate (SENOKOT-S) 8.6-50 MG tablet Take 2 tablets by mouth 2 (two) times daily. Patient taking differently: Take 2 tablets by mouth daily as needed for mild constipation or moderate constipation. 07/05/21   Regalado, Cassie Freer, MD      Allergies    Shellfish allergy and Other    Review of Systems   Review of Systems  Constitutional:  Positive for fever.  HENT:  Negative for sore throat.   Respiratory:  Positive for shortness of breath.   Cardiovascular:  Negative for chest pain.  Gastrointestinal:  Positive for abdominal pain, nausea and vomiting. Negative for diarrhea.  Genitourinary:  Negative for flank pain.  Musculoskeletal:  Negative for back pain.  Skin:  Negative for pallor and wound.  Neurological:  Negative for light-headedness and headaches.  All other  systems reviewed and are negative.   Physical Exam Updated Vital Signs BP 121/83 (BP Location: Left Arm)   Pulse (!) 126   Temp 99 F (37.2 C) (Oral)   Resp (!) 24   Ht 6' (1.829 m)   Wt (!) 149.7 kg   SpO2 96%   BMI 44.76 kg/m  Physical Exam  ED Results / Procedures / Treatments   Labs (all labs ordered are listed, but only abnormal results are displayed) Labs Reviewed  CBC WITH DIFFERENTIAL/PLATELET - Abnormal; Notable for the following components:       Result Value   WBC 43.5 (*)    RBC 4.05 (*)    Hemoglobin 12.9 (*)    HCT 38.7 (*)    Neutro Abs 38.2 (*)    Lymphs Abs 0.6 (*)    Monocytes Absolute 4.1 (*)    Abs Immature Granulocytes 0.60 (*)    All other components within normal limits  COMPREHENSIVE METABOLIC PANEL - Abnormal; Notable for the following components:   Sodium 133 (*)    Potassium 3.3 (*)    Glucose, Bld 145 (*)    ALT 116 (*)    Total Bilirubin 1.6 (*)    All other components within normal limits  CULTURE, BLOOD (ROUTINE X 2)  CULTURE, BLOOD (ROUTINE X 2)  LIPASE, BLOOD  LACTIC ACID, PLASMA  LACTIC ACID, PLASMA  URINALYSIS, ROUTINE W REFLEX MICROSCOPIC  MAGNESIUM  PHOSPHORUS    EKG EKG Interpretation  Date/Time:  Wednesday November 07 2021 10:03:47 EDT Ventricular Rate:  125 PR Interval:  146 QRS Duration: 83 QT Interval:  295 QTC Calculation: 426 R Axis:   67 Text Interpretation: new Sinus tachycardia Borderline T abnormalities, inferior leads Confirmed by Blanchie Dessert [phone]) on 11/07/2021 10:13:01 AM  Radiology CT ABDOMEN PELVIS W CONTRAST  Result Date: 11/07/2021 CLINICAL DATA:  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 Sudden onset of RIGHT upper quadrant pain last night, associated nausea at nonbilious nonbloody vomiting. EXAM: CT ANGIOGRAPHY CHEST CT ABDOMEN AND PELVIS WITH CONTRAST TECHNIQUE: Multidetector CT imaging of the chest was performed using the standard protocol during bolus administration of intravenous contrast. Multiplanar CT image reconstructions and MIPs were obtained to evaluate the vascular anatomy. Multidetector CT imaging of the abdomen and pelvis was performed using the standard protocol during bolus administration of intravenous contrast. RADIATION DOSE REDUCTION: This exam was performed according to the departmental dose-optimization program which includes automated exposure control, adjustment of the mA  and/or kV according to patient size and/or use of iterative reconstruction technique. CONTRAST:  169m OMNIPAQUE IOHEXOL 350 MG/ML SOLN IV. No oral contrast. COMPARISON:  CT chest abdomen pelvis 06/02/2021 FINDINGS: CTA CHEST FINDINGS Cardiovascular: RIGHT jugular Port-A-Cath with tip in RIGHT atrium. Aorta normal caliber without aneurysm or dissection. No pericardial effusion. Suboptimal pulmonary arterial enhancement. No definite pulmonary emboli identified. Mediastinum/Nodes: Esophagus unremarkable. Base of cervical region normal appearance. No thoracic adenopathy. BILATERAL gynecomastia present. Lungs/Pleura: Lungs clear. No pulmonary infiltrate, pleural effusion, or pneumothorax. Musculoskeletal: No osseous abnormalities. Review of the MIP images confirms the above findings. CT ABDOMEN and PELVIS FINDINGS Hepatobiliary: CBD stent. Gallbladder wall thickening and significant significant pericholecystic edema. Findings are highly concerning for acute cholecystitis. Four hepatic metastases are identified, 1 new and 3 increased in size. No intrahepatic biliary dilatation. Pancreas: Minimal edema adjacent to pancreatic head, may be related to acute cholecystitis but cannot exclude subtle coexistent focal pancreatitis. Pancreatic ductal dilatation. Mass at head/uncinate process of pancreas  associated with a calcification again identified, ill-defined but approximately 2.9 x 2.5 cm. Spleen: Normal appearance Adrenals/Urinary Tract: Adrenal glands normal appearance. Minimally inhomogeneous nephrogram upper lateral RIGHT kidney, nonspecific, could potentially be seen with pyelonephritis. No renal masses, hydronephrosis, hydroureter or urinary tract calcification. Bladder unremarkable. Stomach/Bowel: Edema adjacent to duodenum related to adjacent suspected cholecystitis and potentially subtle focal pancreatitis. Normal appendix. Long radiopaque foreign body question previously identified biliary stent within splenic  flexure of colon. Stomach and remaining bowel loops normal appearance. Vascular/Lymphatic: Aorta normal caliber. Vascular structures patent. Retrocaval adenopathy unchanged. Reproductive: Unremarkable prostate gland and seminal vesicles Other: No free air or free fluid. Small umbilical hernia containing fat. Musculoskeletal: No osseous metastases seen. Review of the MIP images confirms the above findings. IMPRESSION: Suboptimal pulmonary arterial enhancement without definite evidence of pulmonary embolism. No acute intrathoracic abnormalities. Post CBD stenting with gallbladder wall thickening and significant pericholecystic edema highly concerning for acute cholecystitis, biliary leak considered unlikely; consider radionuclide hepatobiliary imaging to assess for acute cholecystitis. Four hepatic metastases, 1 new and 3 increased in size since previous exam. Stable known mass at head/uncinate process of pancreas with pancreatic ductal dilatation. Minimal edema adjacent to pancreatic head, may be related to acute cholecystitis but cannot exclude subtle coexistent focal pancreatitis. Long radiopaque foreign body question previously identified biliary stent within splenic flexure of colon. Small umbilical hernia containing fat. Minimally inhomogeneous nephrogram upper lateral RIGHT kidney, nonspecific, could potentially be seen with pyelonephritis; recommend correlation with urinalysis. Electronically Signed   By: Lavonia Dana M.D.   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 Sudden onset of RIGHT upper quadrant pain last night, associated nausea at nonbilious nonbloody vomiting. EXAM: CT ANGIOGRAPHY CHEST CT ABDOMEN AND PELVIS WITH CONTRAST TECHNIQUE: Multidetector CT imaging of the chest was performed using the standard protocol during bolus  administration of intravenous contrast. Multiplanar CT image reconstructions and MIPs were obtained to evaluate the vascular anatomy. Multidetector CT imaging of the abdomen and pelvis was performed using the standard protocol during bolus administration of intravenous contrast. RADIATION DOSE REDUCTION: This exam was performed according to the departmental dose-optimization program which includes automated exposure control, adjustment of the mA and/or kV according to patient size and/or use of iterative reconstruction technique. CONTRAST:  125m OMNIPAQUE IOHEXOL 350 MG/ML SOLN IV. No oral contrast. COMPARISON:  CT chest abdomen pelvis 06/02/2021 FINDINGS: CTA CHEST FINDINGS Cardiovascular: RIGHT jugular Port-A-Cath with tip in RIGHT atrium. Aorta normal caliber without aneurysm or dissection. No pericardial effusion. Suboptimal pulmonary arterial enhancement. No definite pulmonary emboli identified. Mediastinum/Nodes: Esophagus unremarkable. Base of cervical region normal appearance. No thoracic adenopathy. BILATERAL gynecomastia present. Lungs/Pleura: Lungs clear. No pulmonary infiltrate, pleural effusion, or pneumothorax. Musculoskeletal: No osseous abnormalities. Review of the MIP images confirms the above findings. CT ABDOMEN and PELVIS FINDINGS Hepatobiliary: CBD stent. Gallbladder wall thickening and significant significant pericholecystic edema. Findings are highly concerning for acute cholecystitis. Four hepatic metastases are identified, 1 new and 3 increased in size. No intrahepatic biliary dilatation. Pancreas: Minimal edema adjacent to pancreatic head, may be related to acute cholecystitis but cannot exclude subtle coexistent focal pancreatitis. Pancreatic ductal dilatation. Mass at head/uncinate process of pancreas associated with a calcification again identified, ill-defined but approximately 2.9 x 2.5 cm. Spleen: Normal appearance Adrenals/Urinary Tract: Adrenal glands normal appearance. Minimally  inhomogeneous nephrogram upper lateral RIGHT kidney, nonspecific, could potentially  be seen with pyelonephritis. No renal masses, hydronephrosis, hydroureter or urinary tract calcification. Bladder unremarkable. Stomach/Bowel: Edema adjacent to duodenum related to adjacent suspected cholecystitis and potentially subtle focal pancreatitis. Normal appendix. Long radiopaque foreign body question previously identified biliary stent within splenic flexure of colon. Stomach and remaining bowel loops normal appearance. Vascular/Lymphatic: Aorta normal caliber. Vascular structures patent. Retrocaval adenopathy unchanged. Reproductive: Unremarkable prostate gland and seminal vesicles Other: No free air or free fluid. Small umbilical hernia containing fat. Musculoskeletal: No osseous metastases seen. Review of the MIP images confirms the above findings. IMPRESSION: Suboptimal pulmonary arterial enhancement without definite evidence of pulmonary embolism. No acute intrathoracic abnormalities. Post CBD stenting with gallbladder wall thickening and significant pericholecystic edema highly concerning for acute cholecystitis, biliary leak considered unlikely; consider radionuclide hepatobiliary imaging to assess for acute cholecystitis. Four hepatic metastases, 1 new and 3 increased in size since previous exam. Stable known mass at head/uncinate process of pancreas with pancreatic ductal dilatation. Minimal edema adjacent to pancreatic head, may be related to acute cholecystitis but cannot exclude subtle coexistent focal pancreatitis. Long radiopaque foreign body question previously identified biliary stent within splenic flexure of colon. Small umbilical hernia containing fat. Minimally inhomogeneous nephrogram upper lateral RIGHT kidney, nonspecific, could potentially be seen with pyelonephritis; recommend correlation with urinalysis. Electronically Signed   By: Lavonia Dana M.D.   On: 11/07/2021 11:24   DG Chest 1  View  Result Date: 11/07/2021 CLINICAL DATA:  Shortness of breath. Pain under RIGHT breast. Status post stent placement to pancreas on Monday. EXAM: CHEST  1 VIEW COMPARISON:  None Available. FINDINGS: Heart size and mediastinal contours are within normal limits. RIGHT chest wall Port-A-Cath in place with tip positioned at the level of the RIGHT atrium. Lungs are clear. No pleural effusion or pneumothorax is seen. IMPRESSION: No active disease. No evidence of pneumonia or pulmonary edema. Electronically Signed   By: Franki Cabot M.D.   On: 11/07/2021 08:23   DG ERCP  Result Date: 11/05/2021 CLINICAL DATA:  History high-grade metastatic pancreatic neuroendocrine tumor EXAM: ERCP TECHNIQUE: Multiple spot images obtained with the fluoroscopic device and submitted for interpretation post-procedure. FLUOROSCOPY TIME: FLUOROSCOPY TIME 122 mGy COMPARISON.: CT the chest, abdomen pelvis-10/02/2021 FINDINGS: Twelve spot intraoperative fluoroscopic images of the right upper abdominal quadrant during ERCP are provided for review. Initial image demonstrates an ERCP probe overlying the right upper abdominal quadrant. There has been interval removal of internal biliary stent. Subsequent images demonstrate selective cannulation and opacification of the CBD with persistent narrowing/subtotal occlusion at its mid aspect (image 9) Subsequent images demonstrate insufflation of a balloon with in the distal aspect of the CBD Completion images demonstrate placement of a internal biliary stent traversing the area of narrowing/subtotal occlusion of the mid aspect of the CBD. IMPRESSION: ERCP with biliary stent placement as above. These images were submitted for radiologic interpretation only. Please see the procedural report for the amount of contrast and the fluoroscopy time utilized. Electronically Signed   By: Sandi Mariscal M.D.   On: 11/05/2021 13:28    Procedures Procedures    Medications Ordered in ED Medications   lactated ringers bolus 1,000 mL (has no administration in time range)  piperacillin-tazobactam (ZOSYN) IVPB 3.375 g (has no administration in time range)  ondansetron (ZOFRAN) injection 4 mg (4 mg Intravenous Given 11/07/21 0826)  morphine (PF) 4 MG/ML injection 4 mg (4 mg Intravenous Given 11/07/21 0826)  sodium chloride 0.9 % bolus 1,000 mL (0 mLs Intravenous Stopped 11/07/21 0940)  ibuprofen (  ADVIL) tablet 800 mg (800 mg Oral Given 11/07/21 0826)  piperacillin-tazobactam (ZOSYN) IVPB 3.375 g (0 g Intravenous Stopped 11/07/21 0940)  sodium chloride (PF) 0.9 % injection (  Given 11/07/21 0950)  iohexol (OMNIPAQUE) 350 MG/ML injection 100 mL (100 mLs Intravenous Contrast Given 11/07/21 1027)  HYDROmorphone (DILAUDID) injection 1 mg (1 mg Intravenous Given 11/07/21 1039)  metoCLOPramide (REGLAN) injection 10 mg (10 mg Intravenous Given 11/07/21 1039)    ED Course/ Medical Decision Making/ A&P                           Medical Decision Making Amount and/or Complexity of Data Reviewed Labs: ordered. Radiology: ordered.  Risk Prescription drug management. Decision regarding hospitalization.   This patient presents to the ED for concern of right upper abdominal pain, this involves a number of treatment options, and is a complaint that carries with it a high risk of complications and morbidity.  The differential diagnosis includes post op complication such as perforation versus infection.    Co morbidities: Discussed in HPI   Brief History:  Patient with underlying neuroendocrine carcinoma here with right upper quadrant pain which began last night, reported to be severe in nature in addition with nausea and vomiting.  He is postop day 2 of ERCP with stent placement.  He arrived with a low-grade temp of 99.8, tachycardic with a heart rate in the 130s, respirations are 32.  No alleviating or exacerbating factors although noted to worsen with deep inspiration.  EMR reviewed including pt PMHx,  past surgical history and past visits to ER.   See HPI for more details   Lab Tests:  I ordered and independently interpreted labs.  The pertinent results include:    Labs notable for leukocytosis of 43.5, significantly elevated from his prior level a couple of days ago.  CMP remarkable for some decrease in his sodium, decrease in his potassium.  His creatinine levels within normal limits.  LFTs are improved from prior.  AST at 21, ALT at 116, bili is 1.6.  Lipase level is normal.   Imaging Studies:  Chest xray is without any acute findings.     Cardiac Monitoring:  The patient was maintained on a cardiac monitor.  I personally viewed and interpreted the cardiac monitored which showed an underlying rhythm of: Sinus tachycardia EKG non-ischemic   Medicines ordered:  I ordered medication including morphine, Zofran, ibuprofen for symptomatic treatment Reevaluation of the patient after these medicines showed that the patient improved I have reviewed the patients home medicines and have made adjustments as needed   Critical Interventions:  Patient started prophylactically on Zosyn antibiotics for intra-abdominal infection in the setting of postop fever 2 days later.   Consults:  I requested consultation with [facility],  and discussed lab and imaging findings as well as pertinent plan - they recommend: CT abdomen and pelvis and further admission to the hospitalist service.  11:56 AM spoke to APP general surgery Jerene Pitch who will evaluate patient while in the ED.  Dr. Kae Heller is currently in the operating room therefore unable to speak to her at this time.  Reevaluation:  After the interventions noted above I re-evaluated patient and found that they have :stayed the same   Social Determinants of Health:  The patient's social determinants of health were a factor in the care of this patient    Problem List / ED Course:  Patient here status post ERCP for stone removal  2 days  ago by Dr. Lu Duffel got of [facility] gastroenterology.  Arrived febrile with a low temp of 99.8, tachycardic at 130s, tachypneic in some noted hypoxia with an oxygen saturation at 95%.  Severe pain to the right upper quadrant no alleviating or exacerbating factors.  Labs on today's visit remarkable for CBC with a leukocytosis of 43.5, CMP with slight decrease in potassium and sodium.  LFTs are actually improved from prior.  Lipase levels are normal.  Given morphine, Zofran for symptomatic treatment, due to low-grade temp also given some ibuprofen.  Started prophylactically on Zosyn antibiotics for intra-abdominal pathology, requested consultation with [facility] gastroenterology. The rest of patient's work-up is reassuring, without any obvious source of infection at this time.  Given Dilaudid, Reglan for symptomatic treatment.  After consultation with GI a CT of the abdomen was ordered to rule out any further obstruction or intra-abdominal pathology.  Patient continues to be tachycardic, somewhat hypoxic did consider pulmonary embolism in the setting of chemotherapy, also order a CT angio chest. CT angio chest without any pulmonary embolism.  CT abdomen concerning for cholecystitis, received a call from [facility] gastroenterologist MD, who is happy to discuss this case with general surgery however would like them involved.  I did speak to general surgery APP as her attending is currently in surgery Dr. Romana Juniper.  Patient is hemodynamically stable, will need to be seen by general surgery.  Will replace hospital admission at this time.  Dispostion:  After consideration of the diagnostic results and the patients response to treatment, I feel that the patent would benefit from admission for further management of acute cholecystitis.    Portions of this note were generated with [employer]. Dictation errors may occur despite best attempts at proofreading.   Final Clinical Impression(s) / ED Diagnoses Final  diagnoses:  Right upper quadrant abdominal pain    Rx / DC Orders ED Discharge Orders     None         Janeece Fitting, PA-C 11/07/21 [place], MD 11/11/21 1218

## 2021-11-07 NOTE — Op Note (Signed)
Operative Note  Leon Fischer. 31 y.o. male [id number]  11/07/2021  Surgeon: Clovis Riley MD FACS  Assistant: Annye English MD FACS, Lossie Faes MD PGY3  Procedure performed: Laparoscopic Cholecystectomy  Procedure classification: emergent  Preop diagnosis: acute cholecystitis with sepsis Post-op diagnosis/intraop findings: empyema of the gallbladder with acute on chronic cholecystitis  Specimens: gallbladder  Retained items: 17f round blake drain in the gallbladder fossa  EBL: minimal  Complications: none  Description of procedure: After obtaining informed consent the patient was brought to the operating room. Antibiotics were administered. SCD's were applied. General endotracheal anesthesia was initiated and a formal time-out was performed. The abdomen was prepped and draped in the usual sterile fashion and the abdomen was entered using visiport technique in the left upper quadrant after instilling the site with local. Insufflation to 142mg was obtained, 46m48mrocar and camera inserted, and gross inspection revealed no evidence of injury from our entry or other intraabdominal abnormalities. Three 46mm63mocars were introduced in the supraumbilical, right midclavicular and right anterior axillary lines under direct visualization and following infiltration with local. A 12mm50mcar was placed in the epigastrium. The gallbladder was encased in omental adhesions which were carefully swept down.  The gallbladder was pale, edematous and tensely distended.  This was decompressed with the [place] and drained pus which was foul-smelling.  This allowed the gallbladder to be grasped and retracted.  His liver is somewhat stiff as well as the thick indurated tissue surrounding the gallbladder made retraction and dissection somewhat difficult.  The fundus was retracted cephalad and the infundibulum was retracted laterally. A combination of hook electrocautery and blunt dissection was utilized to  clear the peritoneum from the neck and cystic duct, circumferentially isolating the cystic artery and cystic duct and lifting the gallbladder from the cystic plate. The critical view of safety was achieved with the cystic artery, cystic duct, and liver bed visualized between them with no other structures. The artery was clipped with a single clip proximally and distally and divided as was the cystic duct with two clips on the proximal end.  The cystic duct was quite indurated and fibrotic and the clips did not go all the way across this.  A 0 PDS Endoloop was placed just proximal to the clips to further occlude the cystic duct.  The gallbladder was dissected from the liver plate using electrocautery.  There did appear to be a posterior cystic artery branch entering the gallbladder along the gallbladder body which was clipped proximally and distally and divided sharply.  Once freed the gallbladder was placed in an endocatch bag and removed through the epigastric trocar site.  The gallbladder fossa was inspected and appeared hemostatic.  The right upper quadrant was copiously irrigated and the effluent was clear.  Hemostasis was once again confirmed, and reinspection of the abdomen revealed no injuries. The clips/Endoloop were well opposed without any bile leak from the duct or the liver bed.  Given degree of infection and inflammation, a 19 FrencPakistand Blake drain was placed through the right lateral trocar site into the gallbladder fossa.  This is secured to the skin with a 2-0 nylon. The 12mm 52mar site in the epigastrium was closed with 2 simple interrupted 0 vicryls in the fascia under direct visualization using a PMI device. The abdomen was desufflated and all trocars removed. The skin incisions were closed with subcuticular 4-0 monocryl and Dermabond. The patient was awakened, extubated and transported to the recovery room in stable condition.  All counts were correct at the completion of the  case.

## 2021-11-07 NOTE — ED Triage Notes (Signed)
Pt arrives due to pain under rt breast. S/P stint placement to pancreas on Monday. Pain developed last night.

## 2021-11-07 NOTE — Consult Note (Signed)
[facility] Surgery Consult Note  [name]. 02-17-1990  [id number].    Requesting MD: Ronnette Juniper Chief Complaint/Reason for Consult: cholecystitis   HPI:  Leon Fischer. is a 31 y.o. 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 He was previously started on chemotherapy with carboplatin, last dose 10/09/21, more recent dose held due to elevated LFTs. He recently underwent ERCP with new biliary stent placement 11/05/21 by Dr. Watt Climes after the previously placed stent migrated out of the biliary tree; cholangiogram at that time showed no cystic duct filling or gallbladder filling. He was doing well until last night with the pain started. Associated with nausea and vomiting. Denies fever or chills.   In the ED patient was found to be tachycardic, tachypneic, normotensive. CTA chest and CT abdomen/pelvis obtained and concerning for acute cholecystitis with gallbladder wall thickening and significant pericholecystic edema. WBC 43.5. AST 21, ALT 116, Alk phos 115, Tbili 1.6, lipase 34.  General surgery asked to see.  Abdominal surgical history: none Anticoagulants: none Nonsmoker Denies alcohol or illicit drug use    Family History  Problem Relation Age of Onset   Drug abuse Father     Past Medical History:  Diagnosis Date   Asthma    Neuroendocrine cancer [facility])     Past Surgical History:  Procedure Laterality Date   BILIARY DILATION  07/13/2021   Procedure: BILIARY DILATION;  Surgeon: Clarene Essex, MD;  Location: [facility];  Service: Gastroenterology;;   BILIARY STENT PLACEMENT N/A 07/13/2021   Procedure: BILIARY STENT PLACEMENT;  Surgeon: Clarene Essex, MD;  Location: [name];  Service: Gastroenterology;  Laterality: N/A;   BIOPSY  07/13/2021   Procedure:  BIOPSY;  Surgeon: Rush Landmark Telford Nab., MD;  Location: [name];  Service: Gastroenterology;;   ENDOSCOPIC RETROGRADE CHOLANGIOPANCREATOGRAPHY (ERCP) WITH PROPOFOL N/A 07/13/2021   Procedure: ENDOSCOPIC RETROGRADE CHOLANGIOPANCREATOGRAPHY (ERCP) WITH PROPOFOL;  Surgeon: Clarene Essex, MD;  Location: [name];  Service: Gastroenterology;  Laterality: N/A;   ESOPHAGOGASTRODUODENOSCOPY (EGD) WITH PROPOFOL N/A 07/13/2021   Procedure: ESOPHAGOGASTRODUODENOSCOPY (EGD) WITH PROPOFOL;  Surgeon: Rush Landmark Telford Nab., MD;  Location: [name];  Service: Gastroenterology;  Laterality: N/A;   EUS N/A 07/13/2021   Procedure: UPPER ENDOSCOPIC ULTRASOUND (EUS) LINEAR;  Surgeon: Irving Copas., MD;  Location: [name];  Service: Gastroenterology;  Laterality: N/A;   FINE NEEDLE ASPIRATION  07/13/2021   Procedure: FINE NEEDLE ASPIRATION (FNA) LINEAR;  Surgeon: Irving Copas., MD;  Location: [facility];  Service: Gastroenterology;;   IR IMAGING GUIDED PORT INSERTION  07/31/2021   SPHINCTEROTOMY  07/13/2021   Procedure: Joan Mayans;  Surgeon: Clarene Essex, MD;  Location: [name];  Service: Gastroenterology;;    Social History:  reports that he has been smoking. He has never used smokeless tobacco. He reports current alcohol use. He reports that he does not use drugs.  Allergies:  Allergies  Allergen Reactions   Shellfish Allergy Swelling and Shortness Of Breath    Eyes, throat swell shut.    Other     (Not in a hospital admission)   Prior to Admission medications   Medication Sig Start Date End Date Taking? Authorizing Provider  albuterol (VENTOLIN HFA) 108 (90 Base) MCG/ACT inhaler Inhale 2 puffs into the lungs every 4 (four) hours as needed for wheezing or shortness of breath. 10/30/21   [name],  MD  dexamethasone (DECADRON) 4 MG tablet Take 2 tablets (8 mg total) by mouth daily. Start the day after chemotherapy for 1 day. 09/17/21   [name],  MD  lidocaine-prilocaine (EMLA) cream Apply to affected area once 07/23/21   [name], MD  LORazepam (ATIVAN) 0.5 MG tablet Take 1 tablet (0.5 mg total) by mouth every 6 (six) hours as needed (Nausea or vomiting). 07/23/21   [name], MD  ondansetron (ZOFRAN) 8 MG tablet Take 1 tablet (8 mg total) by mouth 2 (two) times daily as needed for refractory nausea / vomiting. Start on day 3 after carboplatin chemo. 07/23/21   [name], MD  pantoprazole (PROTONIX) 40 MG tablet Take 1 tablet (40 mg total) by mouth daily. Patient not taking: Reported on 11/02/2021 07/05/21 07/05/22  Regalado, Jerald Kief A, MD  polyethylene glycol (MIRALAX / GLYCOLAX) 17 g packet Take 17 g by mouth daily. Patient taking differently: Take 17 g by mouth daily as needed for mild constipation or moderate constipation. 07/05/21   Regalado, Belkys A, MD  prochlorperazine (COMPAZINE) 10 MG tablet Take 1 tablet (10 mg total) by mouth every 6 (six) hours as needed (Nausea or vomiting). 07/23/21   [name], MD  senna-docusate (SENOKOT-S) 8.6-50 MG tablet Take 2 tablets by mouth 2 (two) times daily. Patient taking differently: Take 2 tablets by mouth daily as needed for mild constipation or moderate constipation. 07/05/21   Regalado, Belkys A, MD    Blood pressure 121/83, pulse (!) 126, temperature 99 F (37.2 C), temperature source Oral, resp. rate (!) 24, height 6' (1.829 m), weight (!) 149.7 kg, SpO2 96 %. Physical Exam: General: pleasant, WD/WN male who is laying in bed in NAD HEENT: head is normocephalic, atraumatic.  Sclera are noninjected.  Pupils equal and round.  Ears and nose without any masses or lesions.  Mouth is pink and moist. Dentition fair Heart: tachycardic Lungs: CTAB, no wheezes, rhonchi, or rales noted.  Respiratory effort nonlabored on room air Abd: soft, minimal distension, +BS, no masses, hernias, or organomegaly. Moderate RUQ TTP with voluntary guarding MS: no BUE/BLE edema,  calves soft and nontender Skin: warm and dry with no masses, lesions, or rashes Psych: A&Ox4 with an appropriate affect Neuro: MAEs, no gross motor or sensory deficits BUE/BLE  Results for orders placed or performed during the hospital encounter of 11/07/21 (from the past 48 hour(s))  CBC with Differential     Status: Abnormal   Collection Time: 11/07/21  7:54 AM  Result Value Ref Range   WBC 43.5 (H) 4.0 - 10.5 K/uL   RBC 4.05 (L) 4.22 - 5.81 MIL/uL   Hemoglobin 12.9 (L) 13.0 - 17.0 g/dL   HCT 38.7 (L) 39.0 - 52.0 %   MCV 95.6 80.0 - 100.0 fL   MCH 31.9 26.0 - 34.0 pg   MCHC 33.3 30.0 - 36.0 g/dL   RDW 14.2 11.5 - 15.5 %   Platelets 345 150 - 400 K/uL   nRBC 0.0 0.0 - 0.2 %   Neutrophils Relative % 89 %   Neutro Abs 38.2 (H) 1.7 - 7.7 K/uL   Lymphocytes Relative 1 %   Lymphs Abs 0.6 (L) 0.7 - 4.0 K/uL   Monocytes Relative 9 %   Monocytes Absolute 4.1 (H) 0.1 - 1.0 K/uL   Eosinophils Relative 0 %   Eosinophils Absolute 0.0 0.0 - 0.5 K/uL   Basophils Relative 0 %   Basophils Absolute 0.1 0.0 - 0.1 K/uL  Immature Granulocytes 1 %   Abs Immature Granulocytes 0.60 (H) 0.00 - 0.07 K/uL    Comment: Performed at [facility], [place] [address] [place], [place] [address]  Comprehensive metabolic panel     Status: Abnormal   Collection Time: 11/07/21  7:54 AM  Result Value Ref Range   Sodium 133 (L) 135 - 145 mmol/L   Potassium 3.3 (L) 3.5 - 5.1 mmol/L   Chloride 101 98 - 111 mmol/L   CO2 23 22 - 32 mmol/L   Glucose, Bld 145 (H) 70 - 99 mg/dL    Comment: Glucose reference range applies only to samples taken after fasting for at least 8 hours.   BUN 9 6 - 20 mg/dL   Creatinine, Ser 0.88 0.61 - 1.24 mg/dL   Calcium 9.0 8.9 - 10.3 mg/dL   Total Protein 7.9 6.5 - 8.1 g/dL   Albumin 4.2 3.5 - 5.0 g/dL   AST 21 15 - 41 U/L   ALT 116 (H) 0 - 44 U/L   Alkaline Phosphatase 115 38 - 126 U/L   Total Bilirubin 1.6 (H) 0.3 - 1.2 mg/dL   GFR, Estimated >60 >60 mL/min     Comment: (NOTE) Calculated using the CKD-EPI Creatinine Equation (2021)    Anion gap 9 5 - 15    Comment: Performed at [facility], [place] [address] [place], [place] [address]  Lipase, blood     Status: None   Collection Time: 11/07/21  7:54 AM  Result Value Ref Range   Lipase 34 11 - 51 U/L    Comment: Performed at [facility], [place] [address], [place], [place] [address]  Lactic acid, plasma     Status: None   Collection Time: 11/07/21  7:54 AM  Result Value Ref Range   Lactic Acid, Venous 0.9 0.5 - 1.9 mmol/L    Comment: Performed at [facility], [place] [address], [place], [place] [address]   CT ABDOMEN PELVIS W CONTRAST  Result Date: 11/07/2021 CLINICAL DATA:  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 Sudden onset of RIGHT upper quadrant pain last night, associated nausea at nonbilious nonbloody vomiting. EXAM: CT ANGIOGRAPHY CHEST CT ABDOMEN AND PELVIS WITH CONTRAST TECHNIQUE: Multidetector CT imaging of the chest was performed using the standard protocol during bolus administration of intravenous contrast. Multiplanar CT image reconstructions and MIPs were obtained to evaluate the vascular anatomy. Multidetector CT imaging of the abdomen and pelvis was performed using the standard protocol during bolus administration of intravenous contrast. RADIATION DOSE REDUCTION: This exam was performed according to the departmental dose-optimization program which includes automated exposure control, adjustment of the mA and/or kV according to patient size and/or use of iterative reconstruction technique. CONTRAST:  173m OMNIPAQUE IOHEXOL 350 MG/ML SOLN IV. No oral contrast. COMPARISON:  CT chest abdomen pelvis 06/02/2021 FINDINGS: CTA CHEST FINDINGS Cardiovascular: RIGHT jugular Port-A-Cath with tip in RIGHT atrium. Aorta normal caliber without aneurysm or dissection. No pericardial  effusion. Suboptimal pulmonary arterial enhancement. No definite pulmonary emboli identified. Mediastinum/Nodes: Esophagus unremarkable. Base of cervical region normal appearance. No thoracic adenopathy. BILATERAL gynecomastia present. Lungs/Pleura: Lungs clear. No pulmonary infiltrate, pleural effusion, or pneumothorax. Musculoskeletal: No osseous abnormalities. Review of the MIP images confirms the above findings. CT ABDOMEN and PELVIS FINDINGS Hepatobiliary: CBD stent. Gallbladder wall thickening and significant significant pericholecystic edema. Findings are highly concerning for acute cholecystitis. Four hepatic metastases are identified, 1 new and 3 increased  in size. No intrahepatic biliary dilatation. Pancreas: Minimal edema adjacent to pancreatic head, may be related to acute cholecystitis but cannot exclude subtle coexistent focal pancreatitis. Pancreatic ductal dilatation. Mass at head/uncinate process of pancreas associated with a calcification again identified, ill-defined but approximately 2.9 x 2.5 cm. Spleen: Normal appearance Adrenals/Urinary Tract: Adrenal glands normal appearance. Minimally inhomogeneous nephrogram upper lateral RIGHT kidney, nonspecific, could potentially be seen with pyelonephritis. No renal masses, hydronephrosis, hydroureter or urinary tract calcification. Bladder unremarkable. Stomach/Bowel: Edema adjacent to duodenum related to adjacent suspected cholecystitis and potentially subtle focal pancreatitis. Normal appendix. Long radiopaque foreign body question previously identified biliary stent within splenic flexure of colon. Stomach and remaining bowel loops normal appearance. Vascular/Lymphatic: Aorta normal caliber. Vascular structures patent. Retrocaval adenopathy unchanged. Reproductive: Unremarkable prostate gland and seminal vesicles Other: No free air or free fluid. Small umbilical hernia containing fat. Musculoskeletal: No osseous metastases seen. Review of the MIP  images confirms the above findings. IMPRESSION: Suboptimal pulmonary arterial enhancement without definite evidence of pulmonary embolism. No acute intrathoracic abnormalities. Post CBD stenting with gallbladder wall thickening and significant pericholecystic edema highly concerning for acute cholecystitis, biliary leak considered unlikely; consider radionuclide hepatobiliary imaging to assess for acute cholecystitis. Four hepatic metastases, 1 new and 3 increased in size since previous exam. Stable known mass at head/uncinate process of pancreas with pancreatic ductal dilatation. Minimal edema adjacent to pancreatic head, may be related to acute cholecystitis but cannot exclude subtle coexistent focal pancreatitis. Long radiopaque foreign body question previously identified biliary stent within splenic flexure of colon. Small umbilical hernia containing fat. Minimally inhomogeneous nephrogram upper lateral RIGHT kidney, nonspecific, could potentially be seen with pyelonephritis; recommend correlation with urinalysis. Electronically Signed   By: Lavonia Dana M.D.   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 Sudden onset of RIGHT upper quadrant pain last night, associated nausea at nonbilious nonbloody vomiting. EXAM: CT ANGIOGRAPHY CHEST CT ABDOMEN AND PELVIS WITH CONTRAST TECHNIQUE: Multidetector CT imaging of the chest was performed using the standard protocol during bolus administration of intravenous contrast. Multiplanar CT image reconstructions and MIPs were obtained to evaluate the vascular anatomy. Multidetector CT imaging of the abdomen and pelvis was performed using the standard protocol during bolus administration of intravenous contrast. RADIATION DOSE REDUCTION: This exam was performed according to the departmental dose-optimization  program which includes automated exposure control, adjustment of the mA and/or kV according to patient size and/or use of iterative reconstruction technique. CONTRAST:  153m OMNIPAQUE IOHEXOL 350 MG/ML SOLN IV. No oral contrast. COMPARISON:  CT chest abdomen pelvis 06/02/2021 FINDINGS: CTA CHEST FINDINGS Cardiovascular: RIGHT jugular Port-A-Cath with tip in RIGHT atrium. Aorta normal caliber without aneurysm or dissection. No pericardial effusion. Suboptimal pulmonary arterial enhancement. No definite pulmonary emboli identified. Mediastinum/Nodes: Esophagus unremarkable. Base of cervical region normal appearance. No thoracic adenopathy. BILATERAL gynecomastia present. Lungs/Pleura: Lungs clear. No pulmonary infiltrate, pleural effusion, or pneumothorax. Musculoskeletal: No osseous abnormalities. Review of the MIP images confirms the above findings. CT ABDOMEN and PELVIS FINDINGS Hepatobiliary: CBD stent. Gallbladder wall thickening and significant significant pericholecystic edema. Findings are highly concerning for acute cholecystitis. Four hepatic metastases are identified, 1 new and 3 increased in size. No intrahepatic biliary dilatation. Pancreas: Minimal edema adjacent to pancreatic head, may be related to acute cholecystitis but cannot exclude subtle coexistent focal pancreatitis. Pancreatic ductal dilatation. Mass at head/uncinate process  of pancreas associated with a calcification again identified, ill-defined but approximately 2.9 x 2.5 cm. Spleen: Normal appearance Adrenals/Urinary Tract: Adrenal glands normal appearance. Minimally inhomogeneous nephrogram upper lateral RIGHT kidney, nonspecific, could potentially be seen with pyelonephritis. No renal masses, hydronephrosis, hydroureter or urinary tract calcification. Bladder unremarkable. Stomach/Bowel: Edema adjacent to duodenum related to adjacent suspected cholecystitis and potentially subtle focal pancreatitis. Normal appendix. Long radiopaque  foreign body question previously identified biliary stent within splenic flexure of colon. Stomach and remaining bowel loops normal appearance. Vascular/Lymphatic: Aorta normal caliber. Vascular structures patent. Retrocaval adenopathy unchanged. Reproductive: Unremarkable prostate gland and seminal vesicles Other: No free air or free fluid. Small umbilical hernia containing fat. Musculoskeletal: No osseous metastases seen. Review of the MIP images confirms the above findings. IMPRESSION: Suboptimal pulmonary arterial enhancement without definite evidence of pulmonary embolism. No acute intrathoracic abnormalities. Post CBD stenting with gallbladder wall thickening and significant pericholecystic edema highly concerning for acute cholecystitis, biliary leak considered unlikely; consider radionuclide hepatobiliary imaging to assess for acute cholecystitis. Four hepatic metastases, 1 new and 3 increased in size since previous exam. Stable known mass at head/uncinate process of pancreas with pancreatic ductal dilatation. Minimal edema adjacent to pancreatic head, may be related to acute cholecystitis but cannot exclude subtle coexistent focal pancreatitis. Long radiopaque foreign body question previously identified biliary stent within splenic flexure of colon. Small umbilical hernia containing fat. Minimally inhomogeneous nephrogram upper lateral RIGHT kidney, nonspecific, could potentially be seen with pyelonephritis; recommend correlation with urinalysis. Electronically Signed   By: Lavonia Dana M.D.   On: 11/07/2021 11:24   DG Chest 1 View  Result Date: 11/07/2021 CLINICAL DATA:  Shortness of breath. Pain under RIGHT breast. Status post stent placement to pancreas on Monday. EXAM: CHEST  1 VIEW COMPARISON:  None Available. FINDINGS: Heart size and mediastinal contours are within normal limits. RIGHT chest wall Port-A-Cath in place with tip positioned at the level of the RIGHT atrium. Lungs are clear. No pleural  effusion or pneumothorax is seen. IMPRESSION: No active disease. No evidence of pneumonia or pulmonary edema. Electronically Signed   By: Franki Cabot M.D.   On: 11/07/2021 08:23   DG ERCP  Result Date: 11/05/2021 CLINICAL DATA:  History high-grade metastatic pancreatic neuroendocrine tumor EXAM: ERCP TECHNIQUE: Multiple spot images obtained with the fluoroscopic device and submitted for interpretation post-procedure. FLUOROSCOPY TIME: FLUOROSCOPY TIME 122 mGy COMPARISON.: CT the chest, abdomen pelvis-10/02/2021 FINDINGS: Twelve spot intraoperative fluoroscopic images of the right upper abdominal quadrant during ERCP are provided for review. Initial image demonstrates an ERCP probe overlying the right upper abdominal quadrant. There has been interval removal of internal biliary stent. Subsequent images demonstrate selective cannulation and opacification of the CBD with persistent narrowing/subtotal occlusion at its mid aspect (image 9) Subsequent images demonstrate insufflation of a balloon with in the distal aspect of the CBD Completion images demonstrate placement of a internal biliary stent traversing the area of narrowing/subtotal occlusion of the mid aspect of the CBD. IMPRESSION: ERCP with biliary stent placement as above. These images were submitted for radiologic interpretation only. Please see the procedural report for the amount of contrast and the fluoroscopy time utilized. Electronically Signed   By: Sandi Mariscal M.D.   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 His WBC is significantly elevated at 43.5 and he is tachycardic, normotensive, afebrile. LFTs are not significantly elevated today and lipase is normal. GI has  evaluated and requests consideration for cholecystectomy. Removal of common bile duct stent may temporarily relieve cystic duct occlusion, but this will likely recur when stent is ultimately replaced. The other option would be a percutaneous cholecystostomy tube.  Case discussed with Dr. Kae Heller and Dr. Zenia Resides. While the patient is fairly high risk with surgical intervention, this may be his best option. Surgery, including risks and benefits, were discussed with the patient and his mother and they agree to proceed. He is NPO. Zosyn ordered. Plan for surgery later today.   ID - zosyn VTE - SCDs FEN - IVF, NPO Foley - none  Metastatic pancreatic neuroendocrine tumor (local lymphadenopathy and liver metastases) - followed by Dr. 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 Watt Climes after the previously placed stent migrated Obesity BMI 44.76  I reviewed ED provider notes, Consultant GI notes, last 24 h vitals and pain scores, last 48 h intake and output, last 24 h labs and trends, and last 24 h imaging results   Wellington Hampshire, [facility] 11/07/2021, 12:46 PM Please see Amion for pager number during day hours 7:00am-4:30pm

## 2021-11-07 NOTE — Anesthesia Procedure Notes (Signed)
Procedure Name: Intubation Date/Time: 11/07/2021 2:46 PM  Performed by: Deliah Boston, CRNAPre-anesthesia Checklist: Patient identified, Emergency Drugs available, Suction available and Patient being monitored Patient Re-evaluated:Patient Re-evaluated prior to induction Oxygen Delivery Method: Circle system utilized Preoxygenation: Pre-oxygenation with 100% oxygen Induction Type: IV induction, Rapid sequence and Cricoid Pressure applied Laryngoscope Size: Mac and 4 Grade View: Grade I Tube type: Oral Tube size: 7.5 mm Number of attempts: 1 Airway Equipment and Method: Stylet Placement Confirmation: ETT inserted through vocal cords under direct vision, positive ETCO2 and breath sounds checked- equal and bilateral Secured at: 23 cm Tube secured with: Tape Dental Injury: Teeth and Oropharynx as per pre-operative assessment

## 2021-11-08 ENCOUNTER — Encounter (HOSPITAL_COMMUNITY): Payer: Self-pay | Admitting: Surgery

## 2021-11-08 ENCOUNTER — Other Ambulatory Visit: Payer: Self-pay

## 2021-11-08 LAB — CBC
HCT: 37.6 % — ABNORMAL LOW (ref 39.0–52.0)
Hemoglobin: 12 g/dL — ABNORMAL LOW (ref 13.0–17.0)
MCH: 31.7 pg (ref 26.0–34.0)
MCHC: 31.9 g/dL (ref 30.0–36.0)
MCV: 99.2 fL (ref 80.0–100.0)
Platelets: 313 10*3/uL (ref 150–400)
RBC: 3.79 MIL/uL — ABNORMAL LOW (ref 4.22–5.81)
RDW: 14.2 % (ref 11.5–15.5)
WBC: 39 10*3/uL — ABNORMAL HIGH (ref 4.0–10.5)
nRBC: 0 % (ref 0.0–0.2)

## 2021-11-08 LAB — COMPREHENSIVE METABOLIC PANEL
ALT: 106 U/L — ABNORMAL HIGH (ref 0–44)
AST: 35 U/L (ref 15–41)
Albumin: 3.6 g/dL (ref 3.5–5.0)
Alkaline Phosphatase: 101 U/L (ref 38–126)
Anion gap: 7 (ref 5–15)
BUN: 11 mg/dL (ref 6–20)
CO2: 27 mmol/L (ref 22–32)
Calcium: 9 mg/dL (ref 8.9–10.3)
Chloride: 107 mmol/L (ref 98–111)
Creatinine, Ser: 0.95 mg/dL (ref 0.61–1.24)
GFR, Estimated: >60 mL/min (ref >60)
Glucose, Bld: 108 mg/dL — ABNORMAL HIGH (ref 70–99)
Potassium: 4.6 mmol/L (ref 3.5–5.1)
Sodium: 141 mmol/L (ref 135–145)
Total Bilirubin: 0.6 mg/dL (ref 0.3–1.2)
Total Protein: 7.4 g/dL (ref 6.5–8.1)

## 2021-11-08 MED ORDER — ENOXAPARIN SODIUM 80 MG/0.8ML IJ SOSY
70.0000 mg | PREFILLED_SYRINGE | INTRAMUSCULAR | Status: DC
  Administered 2021-11-08: 70 mg via SUBCUTANEOUS
  Filled 2021-11-08: qty 0.8

## 2021-11-08 MED ORDER — POLYETHYLENE GLYCOL 3350 17 G PO PACK
17.0000 g | PACK | Freq: Every day | ORAL | Status: DC | PRN
  Administered 2021-11-09: 17 g via ORAL
  Filled 2021-11-08: qty 1

## 2021-11-08 MED ORDER — SODIUM CHLORIDE 0.9% FLUSH: 10.0000 mL | INTRAVENOUS | Status: DC | PRN

## 2021-11-08 NOTE — Progress Notes (Signed)
Pharmacy Brief Note - Enoxaparin for VTE prophylaxis:  Assessment: Pt is a 34 yoM admitted with empyema of the gallbladder with acute on chronic cholecystitis. Pt is POD#1 s/p lap chole on 10/4 by Dr. Kae Heller. Pharmacy consulted by [facility] to dose enoxaparin for DVT prophylaxis. Confirmed with [facility] ok to start enoxaparin now.  -TBW: 149.7 kg -BMI: 45 -SCr: 0.95 -CBC: Hgb 12 is slightly low but stable; Plt WNL  Plan: Enoxaparin 0.5 mg/kg (70 mg) subQ daily CBC with AM labs tomorrow  Pharmacy to sign off.   Lenis Noon, PharmD 11/08/21 10:33 AM

## 2021-11-08 NOTE — Progress Notes (Signed)
[facility] Progress Note  1 Day Post-Op  Subjective: CC-  Mother at bedside. Abdomen sore but pain is much less than prior to surgery. He has ambulated to the restroom. Denies n/v. Drinking water.  Objective: Vital signs in last 24 hours: Temp:  [97.4 F (36.3 C)-100 F (37.8 C)] 98.8 F (37.1 C) (10/05 0732) Pulse Rate:  [91-130] 108 (10/05 0732) Resp:  [12-32] 20 (10/05 0732) BP: (110-146)/(73-98) 132/82 (10/05 0732) SpO2:  [94 %-100 %] 98 % (10/05 0732) Weight:  [149.7 kg] 149.7 kg (10/04 1400)    Intake/Output from previous day: 10/04 0701 - 10/05 0700 In: 3902.6 [I.V.:2171.4; IV Piggyback:1731.2] Out: 1200 [Urine:1000; Drains:80; Blood:20] Intake/Output this shift: No intake/output data recorded.  PE: Gen:  Alert, NAD, pleasant Cardio: tachy Pulm: rate and effort normal on room air Abd: soft, appropriately tender over drain and incisions, lap incisions cdi without erythema or drainage, JP with orange/serosanguinous fluid in bulb  Lab Results:  Recent Labs    11/07/21 0754 11/08/21 0514  WBC 43.5* 39.0*  HGB 12.9* 12.0*  HCT 38.7* 37.6*  PLT 345 313   BMET Recent Labs    11/07/21 0754 11/08/21 0514  NA 133* 141  K 3.3* 4.6  CL 101 107  CO2 23 27  GLUCOSE 145* 108*  BUN 9 11  CREATININE 0.88 0.95  CALCIUM 9.0 9.0   PT/INR No results for input(s): "LABPROT", "INR" in the last 72 hours. CMP     Component Value Date/Time   NA 141 11/08/2021 0514   NA 142 04/14/2017 1000   K 4.6 11/08/2021 0514   CL 107 11/08/2021 0514   CO2 27 11/08/2021 0514   GLUCOSE 108 (H) 11/08/2021 0514   BUN 11 11/08/2021 0514   BUN 12 04/14/2017 1000   CREATININE 0.95 11/08/2021 0514   CREATININE 0.89 10/30/2021 1028   CALCIUM 9.0 11/08/2021 0514   PROT 7.4 11/08/2021 0514   PROT 7.5 04/14/2017 1000   ALBUMIN 3.6 11/08/2021 0514   ALBUMIN 4.6 04/14/2017 1000   AST 35 11/08/2021 0514   AST 381 (HH) 10/30/2021 1028   ALT 106 (H) 11/08/2021 0514   ALT  297 (HH) 10/30/2021 1028   ALKPHOS 101 11/08/2021 0514   BILITOT 0.6 11/08/2021 0514   BILITOT 0.7 10/30/2021 1028   GFRNONAA >60 11/08/2021 0514   GFRNONAA >60 10/30/2021 1028   GFRAA 121 04/14/2017 1000   Lipase     Component Value Date/Time   LIPASE 34 11/07/2021 0754       Studies/Results: CT ABDOMEN PELVIS W CONTRAST  Result Date: 11/07/2021 CLINICAL DATA:  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 Sudden onset of RIGHT upper quadrant pain last night, associated nausea at nonbilious nonbloody vomiting. EXAM: CT ANGIOGRAPHY CHEST CT ABDOMEN AND PELVIS WITH CONTRAST TECHNIQUE: Multidetector CT imaging of the chest was performed using the standard protocol during bolus administration of intravenous contrast. Multiplanar CT image reconstructions and MIPs were obtained to evaluate the vascular anatomy. Multidetector CT imaging of the abdomen and pelvis was performed using the standard protocol during bolus administration of intravenous contrast. RADIATION DOSE REDUCTION: This exam was performed according to the departmental dose-optimization program which includes automated exposure control, adjustment of the mA and/or kV according to patient size and/or use of iterative reconstruction technique. CONTRAST:  182m OMNIPAQUE IOHEXOL 350 MG/ML SOLN IV. No oral contrast. COMPARISON:  CT chest abdomen pelvis 06/02/2021 FINDINGS: CTA CHEST FINDINGS Cardiovascular: RIGHT jugular Port-A-Cath with tip  in RIGHT atrium. Aorta normal caliber without aneurysm or dissection. No pericardial effusion. Suboptimal pulmonary arterial enhancement. No definite pulmonary emboli identified. Mediastinum/Nodes: Esophagus unremarkable. Base of cervical region normal appearance. No thoracic adenopathy. BILATERAL gynecomastia present. Lungs/Pleura: Lungs clear. No pulmonary infiltrate, pleural effusion, or pneumothorax. Musculoskeletal: No osseous  abnormalities. Review of the MIP images confirms the above findings. CT ABDOMEN and PELVIS FINDINGS Hepatobiliary: CBD stent. Gallbladder wall thickening and significant significant pericholecystic edema. Findings are highly concerning for acute cholecystitis. Four hepatic metastases are identified, 1 new and 3 increased in size. No intrahepatic biliary dilatation. Pancreas: Minimal edema adjacent to pancreatic head, may be related to acute cholecystitis but cannot exclude subtle coexistent focal pancreatitis. Pancreatic ductal dilatation. Mass at head/uncinate process of pancreas associated with a calcification again identified, ill-defined but approximately 2.9 x 2.5 cm. Spleen: Normal appearance Adrenals/Urinary Tract: Adrenal glands normal appearance. Minimally inhomogeneous nephrogram upper lateral RIGHT kidney, nonspecific, could potentially be seen with pyelonephritis. No renal masses, hydronephrosis, hydroureter or urinary tract calcification. Bladder unremarkable. Stomach/Bowel: Edema adjacent to duodenum related to adjacent suspected cholecystitis and potentially subtle focal pancreatitis. Normal appendix. Long radiopaque foreign body question previously identified biliary stent within splenic flexure of colon. Stomach and remaining bowel loops normal appearance. Vascular/Lymphatic: Aorta normal caliber. Vascular structures patent. Retrocaval adenopathy unchanged. Reproductive: Unremarkable prostate gland and seminal vesicles Other: No free air or free fluid. Small umbilical hernia containing fat. Musculoskeletal: No osseous metastases seen. Review of the MIP images confirms the above findings. IMPRESSION: Suboptimal pulmonary arterial enhancement without definite evidence of pulmonary embolism. No acute intrathoracic abnormalities. Post CBD stenting with gallbladder wall thickening and significant pericholecystic edema highly concerning for acute cholecystitis, biliary leak considered unlikely; consider  radionuclide hepatobiliary imaging to assess for acute cholecystitis. Four hepatic metastases, 1 new and 3 increased in size since previous exam. Stable known mass at head/uncinate process of pancreas with pancreatic ductal dilatation. Minimal edema adjacent to pancreatic head, may be related to acute cholecystitis but cannot exclude subtle coexistent focal pancreatitis. Long radiopaque foreign body question previously identified biliary stent within splenic flexure of colon. Small umbilical hernia containing fat. Minimally inhomogeneous nephrogram upper lateral RIGHT kidney, nonspecific, could potentially be seen with pyelonephritis; recommend correlation with urinalysis. Electronically Signed   By: Lavonia Dana M.D.   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 Sudden onset of RIGHT upper quadrant pain last night, associated nausea at nonbilious nonbloody vomiting. EXAM: CT ANGIOGRAPHY CHEST CT ABDOMEN AND PELVIS WITH CONTRAST TECHNIQUE: Multidetector CT imaging of the chest was performed using the standard protocol during bolus administration of intravenous contrast. Multiplanar CT image reconstructions and MIPs were obtained to evaluate the vascular anatomy. Multidetector CT imaging of the abdomen and pelvis was performed using the standard protocol during bolus administration of intravenous contrast. RADIATION DOSE REDUCTION: This exam was performed according to the departmental dose-optimization program which includes automated exposure control, adjustment of the mA and/or kV according to patient size and/or use of iterative reconstruction technique. CONTRAST:  148m OMNIPAQUE IOHEXOL 350 MG/ML SOLN IV. No oral contrast. COMPARISON:  CT chest abdomen pelvis 06/02/2021 FINDINGS: CTA CHEST FINDINGS Cardiovascular: RIGHT jugular Port-A-Cath with  tip in RIGHT atrium. Aorta normal caliber without aneurysm or dissection. No pericardial effusion. Suboptimal pulmonary arterial enhancement. No definite pulmonary emboli identified. Mediastinum/Nodes: Esophagus unremarkable. Base of cervical region normal appearance. No  thoracic adenopathy. BILATERAL gynecomastia present. Lungs/Pleura: Lungs clear. No pulmonary infiltrate, pleural effusion, or pneumothorax. Musculoskeletal: No osseous abnormalities. Review of the MIP images confirms the above findings. CT ABDOMEN and PELVIS FINDINGS Hepatobiliary: CBD stent. Gallbladder wall thickening and significant significant pericholecystic edema. Findings are highly concerning for acute cholecystitis. Four hepatic metastases are identified, 1 new and 3 increased in size. No intrahepatic biliary dilatation. Pancreas: Minimal edema adjacent to pancreatic head, may be related to acute cholecystitis but cannot exclude subtle coexistent focal pancreatitis. Pancreatic ductal dilatation. Mass at head/uncinate process of pancreas associated with a calcification again identified, ill-defined but approximately 2.9 x 2.5 cm. Spleen: Normal appearance Adrenals/Urinary Tract: Adrenal glands normal appearance. Minimally inhomogeneous nephrogram upper lateral RIGHT kidney, nonspecific, could potentially be seen with pyelonephritis. No renal masses, hydronephrosis, hydroureter or urinary tract calcification. Bladder unremarkable. Stomach/Bowel: Edema adjacent to duodenum related to adjacent suspected cholecystitis and potentially subtle focal pancreatitis. Normal appendix. Long radiopaque foreign body question previously identified biliary stent within splenic flexure of colon. Stomach and remaining bowel loops normal appearance. Vascular/Lymphatic: Aorta normal caliber. Vascular structures patent. Retrocaval adenopathy unchanged. Reproductive: Unremarkable prostate gland and seminal vesicles Other: No free air or free fluid. Small umbilical  hernia containing fat. Musculoskeletal: No osseous metastases seen. Review of the MIP images confirms the above findings. IMPRESSION: Suboptimal pulmonary arterial enhancement without definite evidence of pulmonary embolism. No acute intrathoracic abnormalities. Post CBD stenting with gallbladder wall thickening and significant pericholecystic edema highly concerning for acute cholecystitis, biliary leak considered unlikely; consider radionuclide hepatobiliary imaging to assess for acute cholecystitis. Four hepatic metastases, 1 new and 3 increased in size since previous exam. Stable known mass at head/uncinate process of pancreas with pancreatic ductal dilatation. Minimal edema adjacent to pancreatic head, may be related to acute cholecystitis but cannot exclude subtle coexistent focal pancreatitis. Long radiopaque foreign body question previously identified biliary stent within splenic flexure of colon. Small umbilical hernia containing fat. Minimally inhomogeneous nephrogram upper lateral RIGHT kidney, nonspecific, could potentially be seen with pyelonephritis; recommend correlation with urinalysis. Electronically Signed   By: Lavonia Dana M.D.   On: 11/07/2021 11:24   DG Chest 1 View  Result Date: 11/07/2021 CLINICAL DATA:  Shortness of breath. Pain under RIGHT breast. Status post stent placement to pancreas on Monday. EXAM: CHEST  1 VIEW COMPARISON:  None Available. FINDINGS: Heart size and mediastinal contours are within normal limits. RIGHT chest wall Port-A-Cath in place with tip positioned at the level of the RIGHT atrium. Lungs are clear. No pleural effusion or pneumothorax is seen. IMPRESSION: No active disease. No evidence of pneumonia or pulmonary edema. Electronically Signed   By: Franki Cabot M.D.   On: 11/07/2021 08:23    Anti-infectives: Anti-infectives (From admission, onward)    Start     Dose/Rate Route Frequency Ordered Stop   11/07/21 1600  piperacillin-tazobactam (ZOSYN) IVPB 3.375 g         3.375 g 12.5 mL/hr over 240 Minutes Intravenous Every 8 hours 11/07/21 1213     11/07/21 1400  piperacillin-tazobactam (ZOSYN) IVPB 3.375 g  Status:  Discontinued        3.375 g 100 mL/hr over 30 Minutes Intravenous Every 8 hours 11/07/21 1207 11/07/21 1213   11/07/21 0815  piperacillin-tazobactam (ZOSYN) IVPB 3.375 g        3.375 g 100 mL/hr over 30 Minutes Intravenous  Once 11/07/21 0810 11/07/21 0940        Assessment/Plan Empyema of the gallbladder with acute on chronic cholecystitis  -  POD#1 s/p laparoscopic cholecystectomy 10/4 Dr. Kae Heller  - Sore but feeling better than prior to surgery - advance to regular diet - Continue JP and monitor output - currently serosanguinous - WBC trending down but still quite elevated at 39, TMAX 100. Continue IV zosyn today. 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 Watt Climes after the previously placed stent migrated Obesity BMI 44.76    LOS: 1 day    Wellington Hampshire, [facility] 11/08/2021, 10:05 AM Please see Amion for pager number during day hours 7:00am-4:30pm

## 2021-11-08 NOTE — Progress Notes (Signed)
[facility] Gastroenterology Progress Note  Leon Fischer. 31 y.o. [birth year]  Subjective:  Patient seen and examined sitting on the side of his bed. He is comfortable, notes great improvement in his RUQ pain after surgery. He underwent laparoscopic cholecystectomy 10/4.     Objective: Vital signs in last 24 hours: Vitals:   11/08/21 0732 11/08/21 1323  BP: 132/82 125/77  Pulse: (!) 108 (!) 118  Resp: 20 20  Temp: 98.8 F (37.1 C) 98.9 F (37.2 C)  SpO2: 98% 100%    Physical Exam:  General:  Alert, cooperative, no distress, appears stated age  Head:  Normocephalic, without obvious abnormality, atraumatic  Eyes:  Anicteric sclera, EOM's intact  Lungs:   Clear to auscultation bilaterally, respirations unlabored  Heart:  Regular rate and rhythm, S1, S2 normal  Abdomen:   Soft, mildly tender over surgical sites, bowel sounds active all four quadrants,  no masses,   Extremities: Extremities normal, atraumatic, no  edema  Pulses: 2+ and symmetric    Lab Results: Recent Labs    11/07/21 0745 11/07/21 0754 11/08/21 0514  NA  --  133* 141  K  --  3.3* 4.6  CL  --  101 107  CO2  --  23 27  GLUCOSE  --  145* 108*  BUN  --  9 11  CREATININE  --  0.88 0.95  CALCIUM  --  9.0 9.0  MG 1.6*  --   --   PHOS 3.8  --   --    Recent Labs    11/07/21 0754 11/08/21 0514  AST 21 35  ALT 116* 106*  ALKPHOS 115 101  BILITOT 1.6* 0.6  PROT 7.9 7.4  ALBUMIN 4.2 3.6   Recent Labs    11/07/21 0754 11/08/21 0514  WBC 43.5* 39.0*  NEUTROABS 38.2*  --   HGB 12.9* 12.0*  HCT 38.7* 37.6*  MCV 95.6 99.2  PLT 345 313   No results for input(s): "LABPROT", "INR" in the last 72 hours.    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   Lab work with significant leukocytosis concerning for possible infection.  T. bili elevated.  Patient is tachycardic with significant right upper quadrant pain. CT scan significant for acute cholecystitis.  Patient has known biliary stricture and will need biliary stent in place.     CT AP 10/4 Post CBD stenting with gallbladder wall thickening and significant pericholecystic edema highly concerning for acute cholecystitis, biliary leak considered unlikely  Underwent laparoscopic cholecystectomy 10/4.    WBC 39.0(43.5), hemoglobin 12.0(12.9), NA 141(133), K 4.6(3.3), AST/ALT improved from 10/30/2021 now at 35/106, T. bili (0.6)1.6,     Plan: Continue to monitor leukocytosis, and liver enzymes.  Continue Zosyn 3.375 mg every 8 hours.  Continue IV Protonix 40 mg  Continue anti-emetics and supportive care as needed. Continue regular diet.  [facility] will follow.      Arvella Nigh Delman Goshorn PA-C 11/08/2021, 2:04 PM  Contact #  [phone]

## 2021-11-08 NOTE — Anesthesia Postprocedure Evaluation (Signed)
Anesthesia Post Note  Patient: Leon Fischer.  Procedure(s) Performed: LAPAROSCOPIC CHOLECYSTECTOMY     Patient location during evaluation: PACU Anesthesia Type: General Level of consciousness: awake and alert Pain management: pain level controlled Vital Signs Assessment: post-procedure vital signs reviewed and stable Respiratory status: spontaneous breathing, nonlabored ventilation and respiratory function stable Cardiovascular status: blood pressure returned to baseline and stable Postop Assessment: no apparent nausea or vomiting Anesthetic complications: no   No notable events documented.  Last Vitals:  Vitals:   11/08/21 0526 11/08/21 0638  BP: (!) 133/95 128/82  Pulse: 99 (!) 110  Resp:  20  Temp: 36.9 C 37.1 C  SpO2: 100% 97%    Last Pain:  Vitals:   11/08/21 0350  TempSrc: Oral  PainSc:                  Lynda Rainwater

## 2021-11-08 NOTE — Progress Notes (Signed)
I engaged in an initial visit with [name] mother outside of the hospital.  She desired prayer over Leon Fischer.  I offered prayer with her outside and to follow-up with Leon Fischer later in the day.  I was able to engage in an initial visit with Leon Fischer, letting him know of the ways Chaplains are here to support him as needed. Leon Fischer voiced that he was doing better than the day before and didn't have any needs right now.   [name], MDiv    11/08/21 1300  Clinical Encounter Type  Visited With Patient;Family  Visit Type Spiritual support;Initial  Spiritual Encounters  Spiritual Needs Prayer  Stress Factors  Family Stress Factors Loss of control;Major life changes

## 2021-11-08 NOTE — Progress Notes (Signed)
Mobility Specialist - Progress Note   11/08/21 1121  Mobility  Activity Ambulated independently in hallway  Activity Response Tolerated well  Distance Ambulated (ft) 500 ft  $Mobility charge 1 Mobility  Level of Assistance Independent  Assistive Device None  HOB Elevated/Bed Position Self regulated  Range of Motion/Exercises Active   Pt was found in bed and agreeable to ambulate. Had no complaints and at EOS returned to bed with all necessities in reach.  Ferd Hibbs Mobility Specialist

## 2021-11-08 NOTE — Progress Notes (Signed)
PROGRESS NOTE    Leon Fischer.  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  In the emergency room hemodynamically stable.  Tachycardic and tachypneic.  WBC count 43,000.  Mildly elevated transaminases.  Treated with IV fluids and IV antibiotics and underwent lap chole.   Assessment & Plan:   Acute acalculous cholecystitis, sepsis present on admission secondary to cholecystitis as evidenced by tachycardia, tachypnea and leukocytosis. Resuscitated.  Status post lap chole Dr. Windle Guard 10/4.  Postop day 1 today.  Clinically stabilizing. IV fluids, advance to regular diet, mobilize, IV Zosyn to continue.  Blood cultures negative so far.   Followed by surgery.  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  Currently on chemotherapy.  Followed by oncology.  Hypomagnesemia: Replaced and adequate.   DVT prophylaxis: Place and maintain sequential compression device Start: 11/07/21 1815   Code Status: Full code Family Communication: Mother at the bedside Disposition Plan: Status is: Inpatient Remains inpatient appropriate because: Immediate postop, IV antibiotics, leukocytosis     Consultants:  General surgery Gastroenterology  Procedures:  ERCP with biliary stent Lap chole  Antimicrobials:  Zosyn 10/4---   Subjective: Patient seen and examined.  Mother at the bedside.  Pleasant and comfortable.  Denies any complaints.  Was inquiring about going home.  We explained about antibiotics need and also with abnormal blood count.  Objective: Vitals:   11/08/21 0226 11/08/21  0526 11/08/21 0638 11/08/21 0732  BP: 114/73 (!) 133/95 128/82 132/82  Pulse: 97 99 (!) 110 (!) 108  Resp: '18  20 20  '$ Temp: 97.9 F (36.6 C) 98.5 F (36.9 C) 98.7 F (37.1 C) 98.8 F (37.1 C)  TempSrc: Oral Oral Oral Oral  SpO2: 98% 100% 97% 98%  Weight:      Height:        Intake/Output Summary (Last 24 hours) at 11/08/2021 1311 Last data filed at 11/08/2021 1259 Gross per 24 hour  Intake 2972.64 ml  Output 1230 ml  Net 1742.64 ml   Filed Weights   11/07/21 0753 11/07/21 1400  Weight: (!) 149.7 kg (!) 149.7 kg    Examination:  General exam: Appears calm and comfortable, laying in bed.  On room air.   Respiratory system: No added sounds.  Patient has a port on his right chest wall. Cardiovascular system: S1 & S2 heard, RRR. No pedal edema. Gastrointestinal system: Nontender.  Bowel sound present.  Laparoscopic ports are clean and dry. Patient has JP drain with a bulb with a small amount of serosanguineous drainage. Central nervous system: Alert and oriented. No focal neurological deficits. Extremities: Symmetric 5 x 5 power. Skin: No rashes, lesions or ulcers Psychiatry: Judgement and insight appear normal. Mood & affect appropriate.     Data Reviewed: I have personally reviewed following labs and imaging studies  CBC: Recent Labs  Lab 11/07/21 0754 11/08/21 0514  WBC 43.5* 39.0*  NEUTROABS 38.2*  --   HGB 12.9* 12.0*  HCT 38.7* 37.6*  MCV 95.6 99.2  PLT 345 419   Basic Metabolic Panel: Recent Labs  Lab 11/07/21 0745 11/07/21 0754 11/08/21 0514  NA  --  133* 141  K  --  3.3* 4.6  CL  --  101 107  CO2  --  23 27  GLUCOSE  --  145* 108*  BUN  --  9 11  CREATININE  --  0.88 0.95  CALCIUM  --  9.0 9.0  MG 1.6*  --   --   PHOS 3.8  --   --    GFR: Estimated Creatinine Clearance: 169.6 mL/min (by C-G formula based on SCr of 0.95 mg/dL). Liver Function Tests: Recent Labs  Lab 11/07/21 0754 11/08/21 0514  AST 21 35  ALT 116* 106*  ALKPHOS 115 101   BILITOT 1.6* 0.6  PROT 7.9 7.4  ALBUMIN 4.2 3.6   Recent Labs  Lab 11/07/21 0754  LIPASE 34   No results for input(s): "AMMONIA" in the last 168 hours. Coagulation Profile: No results for input(s): "INR", "PROTIME" in the last 168 hours. Cardiac Enzymes: No results for input(s): "CKTOTAL", "CKMB", "CKMBINDEX", "TROPONINI" in the last 168 hours. BNP (last 3 results) No results for input(s): "PROBNP" in the last 8760 hours. HbA1C: No results for input(s): "HGBA1C" in the last 72 hours. CBG: No results for input(s): "GLUCAP" in the last 168 hours. Lipid Profile: No results for input(s): "CHOL", "HDL", "LDLCALC", "TRIG", "CHOLHDL", "LDLDIRECT" in the last 72 hours. Thyroid Function Tests: No results for input(s): "TSH", "T4TOTAL", "FREET4", "T3FREE", "THYROIDAB" in the last 72 hours. Anemia Panel: No results for input(s): "VITAMINB12", "FOLATE", "FERRITIN", "TIBC", "IRON", "RETICCTPCT" in the last 72 hours. Sepsis Labs: Recent Labs  Lab 11/07/21 0754 11/07/21 1654  LATICACIDVEN 0.9 2.2*    Recent Results (from the past 240 hour(s))  Blood culture (routine x 2)     Status: None (Preliminary result)   Collection Time: 11/07/21  8:45 AM   Specimen: Left Antecubital; Blood  Result Value Ref Range Status   Specimen Description   Final    LEFT ANTECUBITAL BLOOD Performed at [facility] [address] [place], [place] [address]    Special Requests   Final    BOTTLES DRAWN AEROBIC AND ANAEROBIC Blood Culture adequate volume Performed at [facility] [address] [place], [place] [address]    Culture   Final    NO GROWTH < 24 HOURS Performed at [facility] [address], [place], [place] [address]    Report Status PENDING  Incomplete  Blood culture (routine x 2)     Status: None (Preliminary result)   Collection Time: 11/07/21  8:50 AM   Specimen: BLOOD RIGHT HAND  Result Value Ref Range Status   Specimen Description   Final     BLOOD RIGHT HAND Performed at [facility] [address], [place], [place] [address]    Special Requests   Final    BOTTLES DRAWN AEROBIC ONLY Blood Culture adequate volume Performed at [facility] [address], [place], [place] [address]    Culture   Final    NO GROWTH < 24 HOURS Performed at [facility] [address] [place], [place] [address]    Report Status PENDING  Incomplete         Radiology Studies: CT ABDOMEN PELVIS W CONTRAST  Result Date: 11/07/2021 CLINICAL DATA:  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 Sudden onset of RIGHT upper quadrant pain last night, associated nausea at nonbilious nonbloody vomiting. EXAM: CT ANGIOGRAPHY CHEST CT ABDOMEN AND PELVIS WITH CONTRAST TECHNIQUE: Multidetector CT imaging of  the chest was performed using the standard protocol during bolus administration of intravenous contrast. Multiplanar CT image reconstructions and MIPs were obtained to evaluate the vascular anatomy. Multidetector CT imaging of the abdomen and pelvis was performed using the standard protocol during bolus administration of intravenous contrast. RADIATION DOSE REDUCTION: This exam was performed according to the departmental dose-optimization program which includes automated exposure control, adjustment of the mA and/or kV according to patient size and/or use of iterative reconstruction technique. CONTRAST:  128m OMNIPAQUE IOHEXOL 350 MG/ML SOLN IV. No oral contrast. COMPARISON:  CT chest abdomen pelvis 06/02/2021 FINDINGS: CTA CHEST FINDINGS Cardiovascular: RIGHT jugular Port-A-Cath with tip in RIGHT atrium. Aorta normal caliber without aneurysm or dissection. No pericardial effusion. Suboptimal pulmonary arterial enhancement. No definite pulmonary emboli identified. Mediastinum/Nodes: Esophagus unremarkable. Base of cervical region normal appearance. No  thoracic adenopathy. BILATERAL gynecomastia present. Lungs/Pleura: Lungs clear. No pulmonary infiltrate, pleural effusion, or pneumothorax. Musculoskeletal: No osseous abnormalities. Review of the MIP images confirms the above findings. CT ABDOMEN and PELVIS FINDINGS Hepatobiliary: CBD stent. Gallbladder wall thickening and significant significant pericholecystic edema. Findings are highly concerning for acute cholecystitis. Four hepatic metastases are identified, 1 new and 3 increased in size. No intrahepatic biliary dilatation. Pancreas: Minimal edema adjacent to pancreatic head, may be related to acute cholecystitis but cannot exclude subtle coexistent focal pancreatitis. Pancreatic ductal dilatation. Mass at head/uncinate process of pancreas associated with a calcification again identified, ill-defined but approximately 2.9 x 2.5 cm. Spleen: Normal appearance Adrenals/Urinary Tract: Adrenal glands normal appearance. Minimally inhomogeneous nephrogram upper lateral RIGHT kidney, nonspecific, could potentially be seen with pyelonephritis. No renal masses, hydronephrosis, hydroureter or urinary tract calcification. Bladder unremarkable. Stomach/Bowel: Edema adjacent to duodenum related to adjacent suspected cholecystitis and potentially subtle focal pancreatitis. Normal appendix. Long radiopaque foreign body question previously identified biliary stent within splenic flexure of colon. Stomach and remaining bowel loops normal appearance. Vascular/Lymphatic: Aorta normal caliber. Vascular structures patent. Retrocaval adenopathy unchanged. Reproductive: Unremarkable prostate gland and seminal vesicles Other: No free air or free fluid. Small umbilical hernia containing fat. Musculoskeletal: No osseous metastases seen. Review of the MIP images confirms the above findings. IMPRESSION: Suboptimal pulmonary arterial enhancement without definite evidence of pulmonary embolism. No acute intrathoracic abnormalities. Post CBD  stenting with gallbladder wall thickening and significant pericholecystic edema highly concerning for acute cholecystitis, biliary leak considered unlikely; consider radionuclide hepatobiliary imaging to assess for acute cholecystitis. Four hepatic metastases, 1 new and 3 increased in size since previous exam. Stable known mass at head/uncinate process of pancreas with pancreatic ductal dilatation. Minimal edema adjacent to pancreatic head, may be related to acute cholecystitis but cannot exclude subtle coexistent focal pancreatitis. Long radiopaque foreign body question previously identified biliary stent within splenic flexure of colon. Small umbilical hernia containing fat. Minimally inhomogeneous nephrogram upper lateral RIGHT kidney, nonspecific, could potentially be seen with pyelonephritis; recommend correlation with urinalysis. Electronically Signed   By: MLavonia DanaM.D.   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 Sudden onset of RIGHT upper quadrant pain last night, associated nausea at nonbilious nonbloody vomiting. EXAM: CT ANGIOGRAPHY CHEST CT ABDOMEN AND PELVIS WITH CONTRAST TECHNIQUE: Multidetector CT imaging of the chest was performed using the standard protocol during bolus administration of intravenous contrast. Multiplanar CT image reconstructions and MIPs were obtained to evaluate the vascular anatomy. Multidetector CT imaging of the abdomen  and pelvis was performed using the standard protocol during bolus administration of intravenous contrast. RADIATION DOSE REDUCTION: This exam was performed according to the departmental dose-optimization program which includes automated exposure control, adjustment of the mA and/or kV according to patient size and/or use of iterative reconstruction technique. CONTRAST:  116m OMNIPAQUE  IOHEXOL 350 MG/ML SOLN IV. No oral contrast. COMPARISON:  CT chest abdomen pelvis 06/02/2021 FINDINGS: CTA CHEST FINDINGS Cardiovascular: RIGHT jugular Port-A-Cath with tip in RIGHT atrium. Aorta normal caliber without aneurysm or dissection. No pericardial effusion. Suboptimal pulmonary arterial enhancement. No definite pulmonary emboli identified. Mediastinum/Nodes: Esophagus unremarkable. Base of cervical region normal appearance. No thoracic adenopathy. BILATERAL gynecomastia present. Lungs/Pleura: Lungs clear. No pulmonary infiltrate, pleural effusion, or pneumothorax. Musculoskeletal: No osseous abnormalities. Review of the MIP images confirms the above findings. CT ABDOMEN and PELVIS FINDINGS Hepatobiliary: CBD stent. Gallbladder wall thickening and significant significant pericholecystic edema. Findings are highly concerning for acute cholecystitis. Four hepatic metastases are identified, 1 new and 3 increased in size. No intrahepatic biliary dilatation. Pancreas: Minimal edema adjacent to pancreatic head, may be related to acute cholecystitis but cannot exclude subtle coexistent focal pancreatitis. Pancreatic ductal dilatation. Mass at head/uncinate process of pancreas associated with a calcification again identified, ill-defined but approximately 2.9 x 2.5 cm. Spleen: Normal appearance Adrenals/Urinary Tract: Adrenal glands normal appearance. Minimally inhomogeneous nephrogram upper lateral RIGHT kidney, nonspecific, could potentially be seen with pyelonephritis. No renal masses, hydronephrosis, hydroureter or urinary tract calcification. Bladder unremarkable. Stomach/Bowel: Edema adjacent to duodenum related to adjacent suspected cholecystitis and potentially subtle focal pancreatitis. Normal appendix. Long radiopaque foreign body question previously identified biliary stent within splenic flexure of colon. Stomach and remaining bowel loops normal appearance. Vascular/Lymphatic: Aorta normal caliber.  Vascular structures patent. Retrocaval adenopathy unchanged. Reproductive: Unremarkable prostate gland and seminal vesicles Other: No free air or free fluid. Small umbilical hernia containing fat. Musculoskeletal: No osseous metastases seen. Review of the MIP images confirms the above findings. IMPRESSION: Suboptimal pulmonary arterial enhancement without definite evidence of pulmonary embolism. No acute intrathoracic abnormalities. Post CBD stenting with gallbladder wall thickening and significant pericholecystic edema highly concerning for acute cholecystitis, biliary leak considered unlikely; consider radionuclide hepatobiliary imaging to assess for acute cholecystitis. Four hepatic metastases, 1 new and 3 increased in size since previous exam. Stable known mass at head/uncinate process of pancreas with pancreatic ductal dilatation. Minimal edema adjacent to pancreatic head, may be related to acute cholecystitis but cannot exclude subtle coexistent focal pancreatitis. Long radiopaque foreign body question previously identified biliary stent within splenic flexure of colon. Small umbilical hernia containing fat. Minimally inhomogeneous nephrogram upper lateral RIGHT kidney, nonspecific, could potentially be seen with pyelonephritis; recommend correlation with urinalysis. Electronically Signed   By: MLavonia DanaM.D.   On: 11/07/2021 11:24   DG Chest 1 View  Result Date: 11/07/2021 CLINICAL DATA:  Shortness of breath. Pain under RIGHT breast. Status post stent placement to pancreas on Monday. EXAM: CHEST  1 VIEW COMPARISON:  None Available. FINDINGS: Heart size and mediastinal contours are within normal limits. RIGHT chest wall Port-A-Cath in place with tip positioned at the level of the RIGHT atrium. Lungs are clear. No pleural effusion or pneumothorax is seen. IMPRESSION: No active disease. No evidence of pneumonia or pulmonary edema. Electronically Signed   By: SFranki CabotM.D.   On: 11/07/2021 08:23         Scheduled Meds:  Chlorhexidine Gluconate Cloth  6 each Topical Daily   docusate sodium  100 mg Oral BID  enoxaparin (LOVENOX) injection  70 mg Subcutaneous Q24H   pantoprazole (PROTONIX) IV  40 mg Intravenous QHS   Continuous Infusions:  0.9 % NaCl with KCl 20 mEq / L 100 mL/hr at 11/08/21 0752   methocarbamol (ROBAXIN) IV     piperacillin-tazobactam (ZOSYN)  IV 3.375 g (11/08/21 0752)     LOS: 1 day    Time spent: 35 minutes    Barb Merino, MD [facility] Pager [phone]

## 2021-11-09 ENCOUNTER — Other Ambulatory Visit: Payer: Self-pay

## 2021-11-09 LAB — CBC
HCT: 32.8 % — ABNORMAL LOW (ref 39.0–52.0)
Hemoglobin: 10.5 g/dL — ABNORMAL LOW (ref 13.0–17.0)
MCH: 31.7 pg (ref 26.0–34.0)
MCHC: 32 g/dL (ref 30.0–36.0)
MCV: 99.1 fL (ref 80.0–100.0)
Platelets: 303 10*3/uL (ref 150–400)
RBC: 3.31 MIL/uL — ABNORMAL LOW (ref 4.22–5.81)
RDW: 14.4 % (ref 11.5–15.5)
WBC: 20.2 10*3/uL — ABNORMAL HIGH (ref 4.0–10.5)
nRBC: 0 % (ref 0.0–0.2)

## 2021-11-09 LAB — SURGICAL PATHOLOGY

## 2021-11-09 LAB — COMPREHENSIVE METABOLIC PANEL
ALT: 80 U/L — ABNORMAL HIGH (ref 0–44)
AST: 27 U/L (ref 15–41)
Albumin: 3.2 g/dL — ABNORMAL LOW (ref 3.5–5.0)
Alkaline Phosphatase: 86 U/L (ref 38–126)
Anion gap: 7 (ref 5–15)
BUN: 12 mg/dL (ref 6–20)
CO2: 26 mmol/L (ref 22–32)
Calcium: 8.4 mg/dL — ABNORMAL LOW (ref 8.9–10.3)
Chloride: 107 mmol/L (ref 98–111)
Creatinine, Ser: 0.97 mg/dL (ref 0.61–1.24)
GFR, Estimated: >60 mL/min (ref >60)
Glucose, Bld: 93 mg/dL (ref 70–99)
Potassium: 3.8 mmol/L (ref 3.5–5.1)
Sodium: 140 mmol/L (ref 135–145)
Total Bilirubin: 0.9 mg/dL (ref 0.3–1.2)
Total Protein: 6.7 g/dL (ref 6.5–8.1)

## 2021-11-09 MED ORDER — OXYCODONE HCL 5 MG PO TABS: 5.0000 mg | ORAL_TABLET | Freq: Four times a day (QID) | ORAL | 0 refills | Status: DC | PRN

## 2021-11-09 MED ORDER — HEPARIN SOD (PORK) LOCK FLUSH 100 UNIT/ML IV SOLN
500.0000 [IU] | INTRAVENOUS | Status: AC | PRN
  Administered 2021-11-09: 500 [IU]
  Filled 2021-11-09: qty 5

## 2021-11-09 MED ORDER — AMOXICILLIN-POT CLAVULANATE 875-125 MG PO TABS: 1.0000 | ORAL_TABLET | Freq: Two times a day (BID) | ORAL | 0 refills | Status: AC

## 2021-11-09 NOTE — Progress Notes (Signed)
  Transition of Care [facility]) Screening Note   Patient Details  Name: Leon Fischer. Date of Birth: 04-Dec-1990   Transition of Care [facility]) CM/SW Contact:    Dessa Phi, RN Phone Number: 11/09/2021, 9:56 AM    Transition of Care Department [facility]) has reviewed patient and no TOC needs have been identified at this time. We will continue to monitor patient advancement through interdisciplinary progression rounds. If new patient transition needs arise, please place a TOC consult.

## 2021-11-09 NOTE — Discharge Instructions (Signed)
[facility]  Please arrive at least 30 min before your appointment to complete your check in paperwork.  If you are unable to arrive 30 min prior to your appointment time we may have to cancel or reschedule you. LAPAROSCOPIC SURGERY: POST OP INSTRUCTIONS Always review your discharge instruction sheet given to you by the facility where your surgery was performed. IF YOU HAVE DISABILITY OR FAMILY LEAVE FORMS, YOU MUST BRING THEM TO THE OFFICE FOR PROCESSING.   DO NOT GIVE THEM TO YOUR DOCTOR.  PAIN CONTROL  First take acetaminophen (Tylenol) AND/or ibuprofen (Advil) to control your pain after surgery.  Follow directions on package.  Taking acetaminophen (Tylenol) and/or ibuprofen (Advil) regularly after surgery will help to control your pain and lower the amount of prescription pain medication you may need.  You should not take more than 4,000 mg (4 grams) of acetaminophen (Tylenol) in 24 hours.  You should not take ibuprofen (Advil), aleve, motrin, naprosyn or other NSAIDS if you have a history of stomach ulcers or chronic kidney disease.  A prescription for pain medication may be given to you upon discharge.  Take your pain medication as prescribed, if you still have uncontrolled pain after taking acetaminophen (Tylenol) or ibuprofen (Advil). Use ice packs to help control pain. If you need a refill on your pain medication, please contact your pharmacy.  They will contact our office to request authorization. Prescriptions will not be filled after 5pm or on week-ends.  HOME MEDICATIONS Take your usually prescribed medications unless otherwise directed.  DIET You should follow a light diet the first few days after arrival home.  Be sure to include lots of fluids daily. Avoid fatty, fried foods.   CONSTIPATION It is common to experience some constipation after surgery and if you are taking pain medication.  Increasing fluid intake and taking a stool softener (such as Colace)  will usually help or prevent this problem from occurring.  A mild laxative (Milk of Magnesia or Miralax) should be taken according to package instructions if there are no bowel movements after 48 hours.  WOUND/INCISION CARE Most patients will experience some swelling and bruising in the area of the incisions.  Ice packs will help.  Swelling and bruising can take several days to resolve.  Unless discharge instructions indicate otherwise, follow guidelines below  STERI-STRIPS - you may remove your outer bandages 48 hours after surgery, and you may shower at that time.  You have steri-strips (small skin tapes) in place directly over the incision.  These strips should be left on the skin for 7-10 days.   DERMABOND/SKIN GLUE - you may shower in 24 hours.  The glue will flake off over the next 2-3 weeks. Any sutures or staples will be removed at the office during your follow-up visit.  ACTIVITIES You may resume regular (light) daily activities beginning the next day--such as daily self-care, walking, climbing stairs--gradually increasing activities as tolerated.  You may have sexual intercourse when it is comfortable.  Refrain from any heavy lifting or straining until approved by your doctor. You may drive when you are no longer taking prescription pain medication, you can comfortably wear a seatbelt, and you can safely maneuver your car and apply brakes.  FOLLOW-UP You should see your doctor in the office for a follow-up appointment approximately 2-3 weeks after your surgery.  You should have been given your post-op/follow-up appointment when your surgery was scheduled.  If you did not receive a post-op/follow-up appointment, make sure   that you call for this appointment within a day or two after you arrive home to insure a convenient appointment time.  OTHER INSTRUCTIONS  WHEN TO CALL YOUR DOCTOR: Fever over 101.0 Inability to urinate Continued bleeding from incision. Increased pain, redness, or  drainage from the incision. Increasing abdominal pain  The clinic staff is available to answer your questions during regular business hours.  Please don't hesitate to call and ask to speak to one of the nurses for clinical concerns.  If you have a medical emergency, go to the nearest emergency room or call 911.  A surgeon from [facility] is always on call at the hospital. [address], [address], [place], [place]  [address] ? [address] [address], [place], [place]   [address] [phone] ? [phone] ? FAX [phone]   

## 2021-11-09 NOTE — Discharge Summary (Signed)
Physician Discharge Summary  Leon Fischer. 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  In the emergency room hemodynamically stable.  Tachycardic and tachypneic.  WBC count 43,000.  Mildly elevated transaminases.  Treated with IV fluids and IV antibiotics and underwent lap chole.  Acute acalculous cholecystitis, sepsis present on admission and resolved.  Status post lap chole.  Blood cultures negative.  Pathology with necrotic gallbladder.  Monitored in the hospital postsurgery and did very well.  WBC count trending down.  Currently with normal bowel function.  As per surgery recommendations, patient is going home on Augmentin and pain medications.  Metastatic pancreatic cancer: Under treatment with oncology.  He will follow-up.  Stable for discharge.      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   Other         Medication List     STOP taking these medications    LORazepam 0.5 MG tablet Commonly known as: Ativan   pantoprazole 40 MG tablet Commonly known as: Protonix       TAKE these medications    acetaminophen 500 MG tablet Commonly known as: TYLENOL Take 1,000 mg by mouth every 6 (six) hours as needed for mild pain.   albuterol 108 (90 Base) MCG/ACT inhaler Commonly known as: VENTOLIN HFA Inhale 2 puffs into the lungs every 4 (four) hours as needed for wheezing or shortness of breath.   amoxicillin-clavulanate 875-125 MG tablet Commonly known as: AUGMENTIN Take 1 tablet by mouth 2 (two) times daily for 7 days.   dexamethasone 4 MG tablet Commonly known as: DECADRON Take 2 tablets (8 mg total) by mouth daily. Start the day after chemotherapy for 1 day.   lidocaine-prilocaine cream Commonly known as: EMLA Apply to affected area once   ondansetron 8 MG tablet Commonly known as: Zofran Take 1 tablet (8 mg total) by mouth 2 (two) times daily as needed for refractory nausea / vomiting. Start on day 3 after carboplatin chemo.   oxyCODONE 5 MG immediate release tablet Commonly known as: Oxy IR/ROXICODONE Take 1 tablet (5 mg total) by mouth every 6 (six) hours as needed for severe  pain.   polyethylene glycol 17 g packet Commonly known as: MIRALAX / GLYCOLAX Take 17 g by mouth daily. What changed:  when to take this reasons to take this   prochlorperazine 10 MG tablet Commonly known as: COMPAZINE Take 1 tablet (10 mg total) by mouth every 6 (six) hours as needed (Nausea or vomiting).   senna-docusate 8.6-50 MG tablet Commonly known as: Senokot-S Take 2 tablets by mouth 2 (two) times daily. What  changed:  when to take this reasons to take this        [facility] Go on 11/16/2021.   Specialty: General Surgery Why: Your appointment is 10/13 at 2pm Arrive 75mn early to check in, fill out paperwork, Bring photo ID and insurance information Contact information: [email or web address] Go to.   Specialty: General Surgery Why: Your appointment is 10/ Please arrive 15 minutes early to check in Contact information: [address] [place] [phone]               Allergies  Allergen Reactions   Shellfish Allergy Swelling and Shortness Of Breath    Eyes, throat swell shut.    Other     Consultations: Gastroenterology General surgery   Procedures/Studies: CT ABDOMEN PELVIS W CONTRAST  Result Date: 11/07/2021 CLINICAL DATA:  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 Sudden onset of RIGHT upper quadrant pain last night, associated nausea at nonbilious nonbloody vomiting. EXAM: CT ANGIOGRAPHY CHEST CT ABDOMEN AND PELVIS WITH CONTRAST TECHNIQUE: Multidetector CT imaging of the chest was performed using the standard protocol during bolus administration of intravenous contrast. Multiplanar CT image reconstructions and MIPs were obtained to evaluate the vascular anatomy. Multidetector CT imaging of the abdomen and pelvis was performed using the standard protocol during bolus administration of intravenous contrast. RADIATION DOSE REDUCTION: This exam was performed according to the departmental dose-optimization program which includes automated exposure control, adjustment of the mA and/or kV according to patient size and/or use of iterative reconstruction technique. CONTRAST:  1029mOMNIPAQUE IOHEXOL 350 MG/ML SOLN IV. No oral contrast. COMPARISON:   CT chest abdomen pelvis 06/02/2021 FINDINGS: CTA CHEST FINDINGS Cardiovascular: RIGHT jugular Port-A-Cath with tip in RIGHT atrium. Aorta normal caliber without aneurysm or dissection. No pericardial effusion. Suboptimal pulmonary arterial enhancement. No definite pulmonary emboli identified. Mediastinum/Nodes: Esophagus unremarkable. Base of cervical region normal appearance. No thoracic adenopathy. BILATERAL gynecomastia present. Lungs/Pleura: Lungs clear. No pulmonary infiltrate, pleural effusion, or pneumothorax. Musculoskeletal: No osseous abnormalities. Review of the MIP images confirms the above findings. CT ABDOMEN and PELVIS FINDINGS Hepatobiliary: CBD stent. Gallbladder wall thickening and significant significant pericholecystic edema. Findings are highly concerning for acute cholecystitis. Four hepatic metastases are identified, 1 new and 3 increased in size. No intrahepatic biliary dilatation. Pancreas: Minimal edema adjacent to pancreatic head, may be related to acute cholecystitis but cannot exclude subtle coexistent focal pancreatitis. Pancreatic ductal dilatation. Mass at head/uncinate process of pancreas associated with a calcification again identified, ill-defined but approximately 2.9 x 2.5 cm. Spleen: Normal appearance Adrenals/Urinary Tract: Adrenal glands normal appearance. Minimally inhomogeneous nephrogram upper lateral RIGHT kidney, nonspecific, could potentially be seen with pyelonephritis. No renal masses, hydronephrosis, hydroureter or urinary tract calcification. Bladder unremarkable. Stomach/Bowel: Edema adjacent to duodenum related to adjacent suspected cholecystitis and potentially subtle focal pancreatitis. Normal appendix.  Long radiopaque foreign body question previously identified biliary stent within splenic flexure of colon. Stomach and remaining bowel loops normal appearance. Vascular/Lymphatic: Aorta normal caliber. Vascular structures patent. Retrocaval adenopathy unchanged.  Reproductive: Unremarkable prostate gland and seminal vesicles Other: No free air or free fluid. Small umbilical hernia containing fat. Musculoskeletal: No osseous metastases seen. Review of the MIP images confirms the above findings. IMPRESSION: Suboptimal pulmonary arterial enhancement without definite evidence of pulmonary embolism. No acute intrathoracic abnormalities. Post CBD stenting with gallbladder wall thickening and significant pericholecystic edema highly concerning for acute cholecystitis, biliary leak considered unlikely; consider radionuclide hepatobiliary imaging to assess for acute cholecystitis. Four hepatic metastases, 1 new and 3 increased in size since previous exam. Stable known mass at head/uncinate process of pancreas with pancreatic ductal dilatation. Minimal edema adjacent to pancreatic head, may be related to acute cholecystitis but cannot exclude subtle coexistent focal pancreatitis. Long radiopaque foreign body question previously identified biliary stent within splenic flexure of colon. Small umbilical hernia containing fat. Minimally inhomogeneous nephrogram upper lateral RIGHT kidney, nonspecific, could potentially be seen with pyelonephritis; recommend correlation with urinalysis. Electronically Signed   By: Lavonia Dana M.D.   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 Sudden onset of RIGHT upper quadrant pain last night, associated nausea at nonbilious nonbloody vomiting. EXAM: CT ANGIOGRAPHY CHEST CT ABDOMEN AND PELVIS WITH CONTRAST TECHNIQUE: Multidetector CT imaging of the chest was performed using the standard protocol during bolus administration of intravenous contrast. Multiplanar CT image reconstructions and MIPs were obtained to evaluate the vascular anatomy. Multidetector CT imaging of the abdomen and  pelvis was performed using the standard protocol during bolus administration of intravenous contrast. RADIATION DOSE REDUCTION: This exam was performed according to the departmental dose-optimization program which includes automated exposure control, adjustment of the mA and/or kV according to patient size and/or use of iterative reconstruction technique. CONTRAST:  148m OMNIPAQUE IOHEXOL 350 MG/ML SOLN IV. No oral contrast. COMPARISON:  CT chest abdomen pelvis 06/02/2021 FINDINGS: CTA CHEST FINDINGS Cardiovascular: RIGHT jugular Port-A-Cath with tip in RIGHT atrium. Aorta normal caliber without aneurysm or dissection. No pericardial effusion. Suboptimal pulmonary arterial enhancement. No definite pulmonary emboli identified. Mediastinum/Nodes: Esophagus unremarkable. Base of cervical region normal appearance. No thoracic adenopathy. BILATERAL gynecomastia present. Lungs/Pleura: Lungs clear. No pulmonary infiltrate, pleural effusion, or pneumothorax. Musculoskeletal: No osseous abnormalities. Review of the MIP images confirms the above findings. CT ABDOMEN and PELVIS FINDINGS Hepatobiliary: CBD stent. Gallbladder wall thickening and significant significant pericholecystic edema. Findings are highly concerning for acute cholecystitis. Four hepatic metastases are identified, 1 new and 3 increased in size. No intrahepatic biliary dilatation. Pancreas: Minimal edema adjacent to pancreatic head, may be related to acute cholecystitis but cannot exclude subtle coexistent focal pancreatitis. Pancreatic ductal dilatation. Mass at head/uncinate process of pancreas associated with a calcification again identified, ill-defined but approximately 2.9 x 2.5 cm. Spleen: Normal appearance Adrenals/Urinary Tract: Adrenal glands normal appearance. Minimally inhomogeneous nephrogram upper lateral RIGHT kidney, nonspecific, could potentially be seen with pyelonephritis. No renal masses, hydronephrosis, hydroureter or urinary tract  calcification. Bladder unremarkable. Stomach/Bowel: Edema adjacent to duodenum related to adjacent suspected cholecystitis and potentially subtle focal pancreatitis. Normal appendix. Long radiopaque foreign body question previously identified biliary stent within splenic flexure of colon. Stomach and remaining bowel loops normal appearance. Vascular/Lymphatic: Aorta normal caliber. Vascular structures patent. Retrocaval adenopathy unchanged. Reproductive:  Unremarkable prostate gland and seminal vesicles Other: No free air or free fluid. Small umbilical hernia containing fat. Musculoskeletal: No osseous metastases seen. Review of the MIP images confirms the above findings. IMPRESSION: Suboptimal pulmonary arterial enhancement without definite evidence of pulmonary embolism. No acute intrathoracic abnormalities. Post CBD stenting with gallbladder wall thickening and significant pericholecystic edema highly concerning for acute cholecystitis, biliary leak considered unlikely; consider radionuclide hepatobiliary imaging to assess for acute cholecystitis. Four hepatic metastases, 1 new and 3 increased in size since previous exam. Stable known mass at head/uncinate process of pancreas with pancreatic ductal dilatation. Minimal edema adjacent to pancreatic head, may be related to acute cholecystitis but cannot exclude subtle coexistent focal pancreatitis. Long radiopaque foreign body question previously identified biliary stent within splenic flexure of colon. Small umbilical hernia containing fat. Minimally inhomogeneous nephrogram upper lateral RIGHT kidney, nonspecific, could potentially be seen with pyelonephritis; recommend correlation with urinalysis. Electronically Signed   By: Lavonia Dana M.D.   On: 11/07/2021 11:24   DG Chest 1 View  Result Date: 11/07/2021 CLINICAL DATA:  Shortness of breath. Pain under RIGHT breast. Status post stent placement to pancreas on Monday. EXAM: CHEST  1 VIEW COMPARISON:  None  Available. FINDINGS: Heart size and mediastinal contours are within normal limits. RIGHT chest wall Port-A-Cath in place with tip positioned at the level of the RIGHT atrium. Lungs are clear. No pleural effusion or pneumothorax is seen. IMPRESSION: No active disease. No evidence of pneumonia or pulmonary edema. Electronically Signed   By: Franki Cabot M.D.   On: 11/07/2021 08:23   DG ERCP  Result Date: 11/05/2021 CLINICAL DATA:  History high-grade metastatic pancreatic neuroendocrine tumor EXAM: ERCP TECHNIQUE: Multiple spot images obtained with the fluoroscopic device and submitted for interpretation post-procedure. FLUOROSCOPY TIME: FLUOROSCOPY TIME 122 mGy COMPARISON.: CT the chest, abdomen pelvis-10/02/2021 FINDINGS: Twelve spot intraoperative fluoroscopic images of the right upper abdominal quadrant during ERCP are provided for review. Initial image demonstrates an ERCP probe overlying the right upper abdominal quadrant. There has been interval removal of internal biliary stent. Subsequent images demonstrate selective cannulation and opacification of the CBD with persistent narrowing/subtotal occlusion at its mid aspect (image 9) Subsequent images demonstrate insufflation of a balloon with in the distal aspect of the CBD Completion images demonstrate placement of a internal biliary stent traversing the area of narrowing/subtotal occlusion of the mid aspect of the CBD. IMPRESSION: ERCP with biliary stent placement as above. These images were submitted for radiologic interpretation only. Please see the procedural report for the amount of contrast and the fluoroscopy time utilized. Electronically Signed   By: Sandi Mariscal M.D.   On: 11/05/2021 13:28   (Echo, Carotid, EGD, Colonoscopy, ERCP)    Subjective: Patient seen in the morning rounds.  He had just talked to the surgery and was excited to go home.  Mild soreness but denies any nausea vomiting.  He mobilized around.   Discharge Exam: Vitals:    11/09/21 0121 11/09/21 0612  BP: 124/77 129/73  Pulse: (!) 110 (!) 103  Resp: 20 15  Temp: 98.5 F (36.9 C) 99.2 F (37.3 C)  SpO2: 98% 98%   Vitals:   11/08/21 1738 11/08/21 2046 11/09/21 0121 11/09/21 0612  BP: 121/75 119/67 124/77 129/73  Pulse: (!) 124 (!) 117 (!) 110 (!) 103  Resp: '20 17 20 15  '$ Temp: 99.5 F (37.5 C) 99.7 F (37.6 C) 98.5 F (36.9 C) 99.2 F (37.3 C)  TempSrc: Oral Oral Oral Oral  SpO2:  100% 97% 98% 98%  Weight:      Height:        Patient looks fairly comfortable.  On room air.  Walking around.    The results of significant diagnostics from this hospitalization (including imaging, microbiology, ancillary and laboratory) are listed below for reference.     Microbiology: Recent Results (from the past 240 hour(s))  Blood culture (routine x 2)     Status: None (Preliminary result)   Collection Time: 11/07/21  8:45 AM   Specimen: Left Antecubital; Blood  Result Value Ref Range Status   Specimen Description   Final    LEFT ANTECUBITAL BLOOD Performed at [facility], [address] [place], [place] [address]    Special Requests   Final    BOTTLES DRAWN AEROBIC AND ANAEROBIC Blood Culture adequate volume Performed at [facility] [address], [place], [place] [address]    Culture   Final    NO GROWTH 2 DAYS Performed at [facility] [address] [place], [place] [address]    Report Status PENDING  Incomplete  Blood culture (routine x 2)     Status: None (Preliminary result)   Collection Time: 11/07/21  8:50 AM   Specimen: BLOOD RIGHT HAND  Result Value Ref Range Status   Specimen Description   Final    BLOOD RIGHT HAND Performed at [facility] [address] [place], [place] [address]    Special Requests   Final    BOTTLES DRAWN AEROBIC ONLY Blood Culture adequate volume Performed at [facility] [address] [place], [place] [address]    Culture   Final     NO GROWTH 2 DAYS Performed at [facility] [address], [place], [place] [address]    Report Status PENDING  Incomplete     Labs: BNP (last 3 results) No results for input(s): "BNP" in the last 8760 hours. Basic Metabolic Panel: Recent Labs  Lab 11/07/21 0745 11/07/21 0754 11/08/21 0514 11/09/21 0325  NA  --  133* 141 140  K  --  3.3* 4.6 3.8  CL  --  101 107 107  CO2  --  '23 27 26  '$ GLUCOSE  --  145* 108* 93  BUN  --  '9 11 12  '$ CREATININE  --  0.88 0.95 0.97  CALCIUM  --  9.0 9.0 8.4*  MG 1.6*  --   --   --   PHOS 3.8  --   --   --    Liver Function Tests: Recent Labs  Lab 11/07/21 0754 11/08/21 0514 11/09/21 0325  AST 21 35 27  ALT 116* 106* 80*  ALKPHOS 115 101 86  BILITOT 1.6* 0.6 0.9  PROT 7.9 7.4 6.7  ALBUMIN 4.2 3.6 3.2*   Recent Labs  Lab 11/07/21 0754  LIPASE 34   No results for input(s): "AMMONIA" in the last 168 hours. CBC: Recent Labs  Lab 11/07/21 0754 11/08/21 0514 11/09/21 0325  WBC 43.5* 39.0* 20.2*  NEUTROABS 38.2*  --   --   HGB 12.9* 12.0* 10.5*  HCT 38.7* 37.6* 32.8*  MCV 95.6 99.2 99.1  PLT 345 313 303   Cardiac Enzymes: No results for input(s): "CKTOTAL", "CKMB", "CKMBINDEX", "TROPONINI" in the last 168 hours. BNP: Invalid input(s): "POCBNP" CBG: No results for input(s): "GLUCAP" in the last 168 hours. D-Dimer No results for input(s): "DDIMER" in the last 72 hours. Hgb A1c  No results for input(s): "HGBA1C" in the last 72 hours. Lipid Profile No results for input(s): "CHOL", "HDL", "LDLCALC", "TRIG", "CHOLHDL", "LDLDIRECT" in the last 72 hours. Thyroid function studies No results for input(s): "TSH", "T4TOTAL", "T3FREE", "THYROIDAB" in the last 72 hours.  Invalid input(s): "FREET3" Anemia work up No results for input(s): "VITAMINB12", "FOLATE", "FERRITIN", "TIBC", "IRON", "RETICCTPCT" in the last 72 hours. Urinalysis    Component Value Date/Time   COLORURINE AMBER (A) 07/02/2021 1220   APPEARANCEUR CLEAR  07/02/2021 1220   LABSPEC 1.015 07/02/2021 1220   PHURINE 6.0 07/02/2021 1220   GLUCOSEU NEGATIVE 07/02/2021 1220   HGBUR TRACE (A) 07/02/2021 1220   BILIRUBINUR SMALL (A) 07/02/2021 1220   KETONESUR NEGATIVE 07/02/2021 1220   PROTEINUR NEGATIVE 07/02/2021 1220   NITRITE NEGATIVE 07/02/2021 1220   LEUKOCYTESUR NEGATIVE 07/02/2021 1220   Sepsis Labs Recent Labs  Lab 11/07/21 0754 11/08/21 0514 11/09/21 0325  WBC 43.5* 39.0* 20.2*   Microbiology Recent Results (from the past 240 hour(s))  Blood culture (routine x 2)     Status: None (Preliminary result)   Collection Time: 11/07/21  8:45 AM   Specimen: Left Antecubital; Blood  Result Value Ref Range Status   Specimen Description   Final    LEFT ANTECUBITAL BLOOD Performed at [facility], [address], [place], [place] [address]    Special Requests   Final    BOTTLES DRAWN AEROBIC AND ANAEROBIC Blood Culture adequate volume Performed at [facility] [address] [place], [place] [address]    Culture   Final    NO GROWTH 2 DAYS Performed at [facility] [address] [place], [place] [address]    Report Status PENDING  Incomplete  Blood culture (routine x 2)     Status: None (Preliminary result)   Collection Time: 11/07/21  8:50 AM   Specimen: BLOOD RIGHT HAND  Result Value Ref Range Status   Specimen Description   Final    BLOOD RIGHT HAND Performed at [facility] [address], [place], [place] [address]    Special Requests   Final    BOTTLES DRAWN AEROBIC ONLY Blood Culture adequate volume Performed at [facility] [address], [place], [place] [address]    Culture   Final    NO GROWTH 2 DAYS Performed at [facility] [address], [place], [place] [address]    Report Status PENDING  Incomplete     Time coordinating discharge:  32 minutes  SIGNED:   Barb Merino, MD  [facility] 11/09/2021, 9:33 AM

## 2021-11-09 NOTE — Progress Notes (Signed)
[facility] Progress Note  2 Days Post-Op  Subjective: CC-  Sitting up on edge of bed. Abdomen still a little sore but no significant pain. Denies n/v. Tolerating diet. Passing flatus, no BM. WBC down 20.2.  Objective: Vital signs in last 24 hours: Temp:  [98.5 F (36.9 C)-99.7 F (37.6 C)] 99.2 F (37.3 C) (10/06 0612) Pulse Rate:  [103-124] 103 (10/06 0612) Resp:  [15-20] 15 (10/06 0612) BP: (119-129)/(67-77) 129/73 (10/06 0612) SpO2:  [97 %-100 %] 98 % (10/06 0612) Last BM Date :  (pt is unsure)  Intake/Output from previous day: 10/05 0701 - 10/06 0700 In: 2422.9 [P.O.:240; I.V.:2067.4; IV Piggyback:115.5] Out: 65 [Drains:65] Intake/Output this shift: No intake/output data recorded.  PE: Gen:  Alert, NAD, pleasant Pulm: rate and effort normal on room air Abd: soft, appropriately tender over drain and incisions, lap incisions cdi without erythema or drainage, JP with orange/serosanguinous fluid in bulb  Lab Results:  Recent Labs    11/08/21 0514 11/09/21 0325  WBC 39.0* 20.2*  HGB 12.0* 10.5*  HCT 37.6* 32.8*  PLT 313 303   BMET Recent Labs    11/08/21 0514 11/09/21 0325  NA 141 140  K 4.6 3.8  CL 107 107  CO2 27 26  GLUCOSE 108* 93  BUN 11 12  CREATININE 0.95 0.97  CALCIUM 9.0 8.4*   PT/INR No results for input(s): "LABPROT", "INR" in the last 72 hours. CMP     Component Value Date/Time   NA 140 11/09/2021 0325   NA 142 04/14/2017 1000   K 3.8 11/09/2021 0325   CL 107 11/09/2021 0325   CO2 26 11/09/2021 0325   GLUCOSE 93 11/09/2021 0325   BUN 12 11/09/2021 0325   BUN 12 04/14/2017 1000   CREATININE 0.97 11/09/2021 0325   CREATININE 0.89 10/30/2021 1028   CALCIUM 8.4 (L) 11/09/2021 0325   PROT 6.7 11/09/2021 0325   PROT 7.5 04/14/2017 1000   ALBUMIN 3.2 (L) 11/09/2021 0325   ALBUMIN 4.6 04/14/2017 1000   AST 27 11/09/2021 0325   AST 381 (HH) 10/30/2021 1028   ALT 80 (H) 11/09/2021 0325   ALT 297 (HH) 10/30/2021 1028    ALKPHOS 86 11/09/2021 0325   BILITOT 0.9 11/09/2021 0325   BILITOT 0.7 10/30/2021 1028   GFRNONAA >60 11/09/2021 0325   GFRNONAA >60 10/30/2021 1028   GFRAA 121 04/14/2017 1000   Lipase     Component Value Date/Time   LIPASE 34 11/07/2021 0754       Studies/Results: CT ABDOMEN PELVIS W CONTRAST  Result Date: 11/07/2021 CLINICAL DATA:  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 Sudden onset of RIGHT upper quadrant pain last night, associated nausea at nonbilious nonbloody vomiting. EXAM: CT ANGIOGRAPHY CHEST CT ABDOMEN AND PELVIS WITH CONTRAST TECHNIQUE: Multidetector CT imaging of the chest was performed using the standard protocol during bolus administration of intravenous contrast. Multiplanar CT image reconstructions and MIPs were obtained to evaluate the vascular anatomy. Multidetector CT imaging of the abdomen and pelvis was performed using the standard protocol during bolus administration of intravenous contrast. RADIATION DOSE REDUCTION: This exam was performed according to the departmental dose-optimization program which includes automated exposure control, adjustment of the mA and/or kV according to patient size and/or use of iterative reconstruction technique. CONTRAST:  163m OMNIPAQUE IOHEXOL 350 MG/ML SOLN IV. No oral contrast. COMPARISON:  CT chest abdomen pelvis 06/02/2021 FINDINGS: CTA CHEST FINDINGS Cardiovascular: RIGHT jugular Port-A-Cath with tip in RIGHT  atrium. Aorta normal caliber without aneurysm or dissection. No pericardial effusion. Suboptimal pulmonary arterial enhancement. No definite pulmonary emboli identified. Mediastinum/Nodes: Esophagus unremarkable. Base of cervical region normal appearance. No thoracic adenopathy. BILATERAL gynecomastia present. Lungs/Pleura: Lungs clear. No pulmonary infiltrate, pleural effusion, or pneumothorax. Musculoskeletal: No osseous abnormalities. Review of the MIP images  confirms the above findings. CT ABDOMEN and PELVIS FINDINGS Hepatobiliary: CBD stent. Gallbladder wall thickening and significant significant pericholecystic edema. Findings are highly concerning for acute cholecystitis. Four hepatic metastases are identified, 1 new and 3 increased in size. No intrahepatic biliary dilatation. Pancreas: Minimal edema adjacent to pancreatic head, may be related to acute cholecystitis but cannot exclude subtle coexistent focal pancreatitis. Pancreatic ductal dilatation. Mass at head/uncinate process of pancreas associated with a calcification again identified, ill-defined but approximately 2.9 x 2.5 cm. Spleen: Normal appearance Adrenals/Urinary Tract: Adrenal glands normal appearance. Minimally inhomogeneous nephrogram upper lateral RIGHT kidney, nonspecific, could potentially be seen with pyelonephritis. No renal masses, hydronephrosis, hydroureter or urinary tract calcification. Bladder unremarkable. Stomach/Bowel: Edema adjacent to duodenum related to adjacent suspected cholecystitis and potentially subtle focal pancreatitis. Normal appendix. Long radiopaque foreign body question previously identified biliary stent within splenic flexure of colon. Stomach and remaining bowel loops normal appearance. Vascular/Lymphatic: Aorta normal caliber. Vascular structures patent. Retrocaval adenopathy unchanged. Reproductive: Unremarkable prostate gland and seminal vesicles Other: No free air or free fluid. Small umbilical hernia containing fat. Musculoskeletal: No osseous metastases seen. Review of the MIP images confirms the above findings. IMPRESSION: Suboptimal pulmonary arterial enhancement without definite evidence of pulmonary embolism. No acute intrathoracic abnormalities. Post CBD stenting with gallbladder wall thickening and significant pericholecystic edema highly concerning for acute cholecystitis, biliary leak considered unlikely; consider radionuclide hepatobiliary imaging to  assess for acute cholecystitis. Four hepatic metastases, 1 new and 3 increased in size since previous exam. Stable known mass at head/uncinate process of pancreas with pancreatic ductal dilatation. Minimal edema adjacent to pancreatic head, may be related to acute cholecystitis but cannot exclude subtle coexistent focal pancreatitis. Long radiopaque foreign body question previously identified biliary stent within splenic flexure of colon. Small umbilical hernia containing fat. Minimally inhomogeneous nephrogram upper lateral RIGHT kidney, nonspecific, could potentially be seen with pyelonephritis; recommend correlation with urinalysis. Electronically Signed   By: Lavonia Dana M.D.   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 Sudden onset of RIGHT upper quadrant pain last night, associated nausea at nonbilious nonbloody vomiting. EXAM: CT ANGIOGRAPHY CHEST CT ABDOMEN AND PELVIS WITH CONTRAST TECHNIQUE: Multidetector CT imaging of the chest was performed using the standard protocol during bolus administration of intravenous contrast. Multiplanar CT image reconstructions and MIPs were obtained to evaluate the vascular anatomy. Multidetector CT imaging of the abdomen and pelvis was performed using the standard protocol during bolus administration of intravenous contrast. RADIATION DOSE REDUCTION: This exam was performed according to the departmental dose-optimization program which includes automated exposure control, adjustment of the mA and/or kV according to patient size and/or use of iterative reconstruction technique. CONTRAST:  111m OMNIPAQUE IOHEXOL 350 MG/ML SOLN IV. No oral contrast. COMPARISON:  CT chest abdomen pelvis 06/02/2021 FINDINGS: CTA CHEST FINDINGS Cardiovascular: RIGHT jugular Port-A-Cath with tip in RIGHT atrium. Aorta normal  caliber without aneurysm or dissection. No pericardial effusion. Suboptimal pulmonary arterial enhancement. No definite pulmonary emboli identified. Mediastinum/Nodes: Esophagus unremarkable. Base of cervical region normal appearance. No thoracic adenopathy.  BILATERAL gynecomastia present. Lungs/Pleura: Lungs clear. No pulmonary infiltrate, pleural effusion, or pneumothorax. Musculoskeletal: No osseous abnormalities. Review of the MIP images confirms the above findings. CT ABDOMEN and PELVIS FINDINGS Hepatobiliary: CBD stent. Gallbladder wall thickening and significant significant pericholecystic edema. Findings are highly concerning for acute cholecystitis. Four hepatic metastases are identified, 1 new and 3 increased in size. No intrahepatic biliary dilatation. Pancreas: Minimal edema adjacent to pancreatic head, may be related to acute cholecystitis but cannot exclude subtle coexistent focal pancreatitis. Pancreatic ductal dilatation. Mass at head/uncinate process of pancreas associated with a calcification again identified, ill-defined but approximately 2.9 x 2.5 cm. Spleen: Normal appearance Adrenals/Urinary Tract: Adrenal glands normal appearance. Minimally inhomogeneous nephrogram upper lateral RIGHT kidney, nonspecific, could potentially be seen with pyelonephritis. No renal masses, hydronephrosis, hydroureter or urinary tract calcification. Bladder unremarkable. Stomach/Bowel: Edema adjacent to duodenum related to adjacent suspected cholecystitis and potentially subtle focal pancreatitis. Normal appendix. Long radiopaque foreign body question previously identified biliary stent within splenic flexure of colon. Stomach and remaining bowel loops normal appearance. Vascular/Lymphatic: Aorta normal caliber. Vascular structures patent. Retrocaval adenopathy unchanged. Reproductive: Unremarkable prostate gland and seminal vesicles Other: No free air or free fluid. Small umbilical hernia containing fat.  Musculoskeletal: No osseous metastases seen. Review of the MIP images confirms the above findings. IMPRESSION: Suboptimal pulmonary arterial enhancement without definite evidence of pulmonary embolism. No acute intrathoracic abnormalities. Post CBD stenting with gallbladder wall thickening and significant pericholecystic edema highly concerning for acute cholecystitis, biliary leak considered unlikely; consider radionuclide hepatobiliary imaging to assess for acute cholecystitis. Four hepatic metastases, 1 new and 3 increased in size since previous exam. Stable known mass at head/uncinate process of pancreas with pancreatic ductal dilatation. Minimal edema adjacent to pancreatic head, may be related to acute cholecystitis but cannot exclude subtle coexistent focal pancreatitis. Long radiopaque foreign body question previously identified biliary stent within splenic flexure of colon. Small umbilical hernia containing fat. Minimally inhomogeneous nephrogram upper lateral RIGHT kidney, nonspecific, could potentially be seen with pyelonephritis; recommend correlation with urinalysis. Electronically Signed   By: Lavonia Dana M.D.   On: 11/07/2021 11:24    Anti-infectives: Anti-infectives (From admission, onward)    Start     Dose/Rate Route Frequency Ordered Stop   11/07/21 1600  piperacillin-tazobactam (ZOSYN) IVPB 3.375 g        3.375 g 12.5 mL/hr over 240 Minutes Intravenous Every 8 hours 11/07/21 1213     11/07/21 1400  piperacillin-tazobactam (ZOSYN) IVPB 3.375 g  Status:  Discontinued        3.375 g 100 mL/hr over 30 Minutes Intravenous Every 8 hours 11/07/21 1207 11/07/21 1213   11/07/21 0815  piperacillin-tazobactam (ZOSYN) IVPB 3.375 g        3.375 g 100 mL/hr over 30 Minutes Intravenous  Once 11/07/21 0810 11/07/21 0940        Assessment/Plan Empyema of the gallbladder with acute on chronic cholecystitis  -POD#2 s/p laparoscopic cholecystectomy 10/4 Dr. Kae Heller  - tolerating diet and pain  well controlled - labs improving - Continue JP and monitor output - currently serosanguinous - Ok for discharge today from surgical standpoint. Discharge instructions and follow up info on AVS. He will go home with JP drain and follow up next week in our office. I will send rx for pain medication and 1 week of oral antibiotics.   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 Watt Climes after the previously placed stent migrated Obesity BMI 44.76    LOS: 2 days    Wellington [place], [facility] 11/09/2021, 8:31 AM Please see Amion for pager number during day hours 7:00am-4:30pm

## 2021-11-12 ENCOUNTER — Encounter (HOSPITAL_COMMUNITY): Payer: Self-pay

## 2021-11-12 ENCOUNTER — Emergency Department (HOSPITAL_COMMUNITY): Payer: 59

## 2021-11-12 ENCOUNTER — Emergency Department (HOSPITAL_COMMUNITY)
Admission: EM | Admit: 2021-11-12 | Discharge: 2021-11-12 | Disposition: A | Discharge status: Home / Self Care | Payer: 59 | Attending: Emergency Medicine | Admitting: Emergency Medicine

## 2021-11-12 ENCOUNTER — Other Ambulatory Visit: Payer: Self-pay

## 2021-11-12 DIAGNOSIS — M545 Low back pain, unspecified: Secondary | ICD-10-CM

## 2021-11-12 LAB — COMPREHENSIVE METABOLIC PANEL
ALT: 61 U/L — ABNORMAL HIGH (ref 0–44)
AST: 23 U/L (ref 15–41)
Albumin: 3.6 g/dL (ref 3.5–5.0)
Alkaline Phosphatase: 79 U/L (ref 38–126)
Anion gap: 7 (ref 5–15)
BUN: 11 mg/dL (ref 6–20)
CO2: 25 mmol/L (ref 22–32)
Calcium: 9 mg/dL (ref 8.9–10.3)
Chloride: 104 mmol/L (ref 98–111)
Creatinine, Ser: 0.75 mg/dL (ref 0.61–1.24)
GFR, Estimated: >60 mL/min (ref >60)
Glucose, Bld: 119 mg/dL — ABNORMAL HIGH (ref 70–99)
Potassium: 3.4 mmol/L — ABNORMAL LOW (ref 3.5–5.1)
Sodium: 136 mmol/L (ref 135–145)
Total Bilirubin: 0.6 mg/dL (ref 0.3–1.2)
Total Protein: 7.8 g/dL (ref 6.5–8.1)

## 2021-11-12 LAB — CBC WITH DIFFERENTIAL/PLATELET
Abs Immature Granulocytes: 0.06 10*3/uL (ref 0.00–0.07)
Basophils Absolute: 0 10*3/uL (ref 0.0–0.1)
Basophils Relative: 0 %
Eosinophils Absolute: 0.3 10*3/uL (ref 0.0–0.5)
Eosinophils Relative: 2 %
HCT: 33.3 % — ABNORMAL LOW (ref 39.0–52.0)
Hemoglobin: 11 g/dL — ABNORMAL LOW (ref 13.0–17.0)
Immature Granulocytes: 1 %
Lymphocytes Relative: 9 %
Lymphs Abs: 1.1 10*3/uL (ref 0.7–4.0)
MCH: 31.3 pg (ref 26.0–34.0)
MCHC: 33 g/dL (ref 30.0–36.0)
MCV: 94.9 fL (ref 80.0–100.0)
Monocytes Absolute: 1 10*3/uL (ref 0.1–1.0)
Monocytes Relative: 8 %
Neutro Abs: 9.9 10*3/uL — ABNORMAL HIGH (ref 1.7–7.7)
Neutrophils Relative %: 80 %
Platelets: 369 10*3/uL (ref 150–400)
RBC: 3.51 MIL/uL — ABNORMAL LOW (ref 4.22–5.81)
RDW: 13.5 % (ref 11.5–15.5)
WBC: 12.3 10*3/uL — ABNORMAL HIGH (ref 4.0–10.5)
nRBC: 0 % (ref 0.0–0.2)

## 2021-11-12 LAB — CULTURE, BLOOD (ROUTINE X 2)
Culture: NO GROWTH
Culture: NO GROWTH
Special Requests: ADEQUATE
Special Requests: ADEQUATE

## 2021-11-12 LAB — LIPASE, BLOOD: Lipase: 29 U/L (ref 11–51)

## 2021-11-12 LAB — I-STAT CHEM 8, ED
BUN: 9 mg/dL (ref 6–20)
Calcium, Ion: 1.21 mmol/L (ref 1.15–1.40)
Chloride: 103 mmol/L (ref 98–111)
Creatinine, Ser: 0.7 mg/dL (ref 0.61–1.24)
Glucose, Bld: 116 mg/dL — ABNORMAL HIGH (ref 70–99)
HCT: 32 % — ABNORMAL LOW (ref 39.0–52.0)
Hemoglobin: 10.9 g/dL — ABNORMAL LOW (ref 13.0–17.0)
Potassium: 3.5 mmol/L (ref 3.5–5.1)
Sodium: 139 mmol/L (ref 135–145)
TCO2: 25 mmol/L (ref 22–32)

## 2021-11-12 MED ORDER — IOHEXOL 300 MG/ML  SOLN
100.0000 mL | Freq: Once | INTRAMUSCULAR | Status: AC | PRN
  Administered 2021-11-12: 100 mL via INTRAVENOUS

## 2021-11-12 MED ORDER — HEPARIN SOD (PORK) LOCK FLUSH 100 UNIT/ML IV SOLN
INTRAVENOUS | Status: AC
  Filled 2021-11-12: qty 5

## 2021-11-12 MED ORDER — DICLOFENAC SODIUM 1 % EX GEL: 4.0000 g | Freq: Four times a day (QID) | CUTANEOUS | 0 refills | Status: DC

## 2021-11-12 MED ORDER — ONDANSETRON HCL 4 MG/2ML IJ SOLN
4.0000 mg | Freq: Once | INTRAMUSCULAR | Status: AC
  Administered 2021-11-12: 4 mg via INTRAVENOUS
  Filled 2021-11-12: qty 2

## 2021-11-12 MED ORDER — MORPHINE SULFATE (PF) 4 MG/ML IV SOLN
4.0000 mg | Freq: Once | INTRAVENOUS | Status: AC
  Administered 2021-11-12: 4 mg via INTRAVENOUS
  Filled 2021-11-12: qty 1

## 2021-11-12 MED ORDER — SODIUM CHLORIDE 0.9 % IV BOLUS
1000.0000 mL | Freq: Once | INTRAVENOUS | Status: AC
  Administered 2021-11-12: 1000 mL via INTRAVENOUS

## 2021-11-12 NOTE — ED Provider Notes (Signed)
[facility] DEPT Provider Note   CSN: [id number] Arrival date & time: 11/12/21  2376     History  Chief Complaint  Patient presents with   Back Pain    Roshon Duell. is a 31 y.o. male.  31 yo M with chief complaints of right-sided low back pain.  This started yesterday afternoon.  No injury.  Worse with movement palpation and twisting.  Denies any urinary symptoms.  Just had a operation on his gallbladder and had a JP drain placed.  Has a history of cancer.  He denies loss of bowel or bladder denies loss of rectal sensation denies numbness or weakness to his legs.  No fevers.  No trouble with his JP drain site.   Back Pain      Home Medications Prior to Admission medications   Medication Sig Start Date End Date Taking? Authorizing Provider  diclofenac Sodium (VOLTAREN) 1 % GEL Apply 4 g topically 4 (four) times daily. 11/12/21  Yes Deno Etienne, DO  acetaminophen (TYLENOL) 500 MG tablet Take 1,000 mg by mouth every 6 (six) hours as needed for mild pain.    [provider]  albuterol (VENTOLIN HFA) 108 (90 Base) MCG/ACT inhaler Inhale 2 puffs into the lungs every 4 (four) hours as needed for wheezing or shortness of breath. 10/30/21   [name], MD  amoxicillin-clavulanate (AUGMENTIN) 875-125 MG tablet Take 1 tablet by mouth 2 (two) times daily for 7 days. 11/09/21 11/16/21  Meuth, Brooke A, PA-C  dexamethasone (DECADRON) 4 MG tablet Take 2 tablets (8 mg total) by mouth daily. Start the day after chemotherapy for 1 day. 09/17/21   [name], MD  lidocaine-prilocaine (EMLA) cream Apply to affected area once 07/23/21   [name], MD  ondansetron (ZOFRAN) 8 MG tablet Take 1 tablet (8 mg total) by mouth 2 (two) times daily as needed for refractory nausea / vomiting. Start on day 3 after carboplatin chemo. 07/23/21   [name], MD  oxyCODONE (OXY IR/ROXICODONE) 5 MG immediate release tablet Take 1  tablet (5 mg total) by mouth every 6 (six) hours as needed for severe pain. 11/09/21   Meuth, Brooke A, PA-C  polyethylene glycol (MIRALAX / GLYCOLAX) 17 g packet Take 17 g by mouth daily. Patient taking differently: Take 17 g by mouth daily as needed for mild constipation or moderate constipation. 07/05/21   Regalado, Belkys A, MD  prochlorperazine (COMPAZINE) 10 MG tablet Take 1 tablet (10 mg total) by mouth every 6 (six) hours as needed (Nausea or vomiting). 07/23/21   [name], MD  senna-docusate (SENOKOT-S) 8.6-50 MG tablet Take 2 tablets by mouth 2 (two) times daily. Patient taking differently: Take 2 tablets by mouth daily as needed for mild constipation or moderate constipation. 07/05/21   Regalado, Cassie Freer, MD      Allergies    Shellfish allergy and Other    Review of Systems   Review of Systems  Musculoskeletal:  Positive for back pain.    Physical Exam Updated Vital Signs BP (!) 156/98   Pulse 62   Temp 98.1 F (36.7 C) (Oral)   Resp 19   Ht 6' (1.829 m)   Wt (!) 149.7 kg   SpO2 99%   BMI 44.76 kg/m  Physical Exam Vitals and nursing note reviewed.  Constitutional:      Appearance: He is well-developed.  HENT:     Head: Normocephalic and atraumatic.  Eyes:  Pupils: Pupils are equal, round, and reactive to light.  Neck:     Vascular: No JVD.  Cardiovascular:     Rate and Rhythm: Normal rate and regular rhythm.     Heart sounds: No murmur heard.    No friction rub. No gallop.  Pulmonary:     Effort: No respiratory distress.     Breath sounds: No wheezing.  Abdominal:     General: There is no distension.     Tenderness: There is no abdominal tenderness. There is no guarding or rebound.     Comments: JP drain in place to the right upper quadrant no surrounding erythema or drainage or tenderness side.  Abdomen is soft nontender diffusely.  He has some pain about the right low back.  Reproduces his discomfort.  Pulse motor and sensation intact to the  right lower extremity.  Negative straight leg raise test.  Musculoskeletal:        General: Normal range of motion.     Cervical back: Normal range of motion and neck supple.  Skin:    Coloration: Skin is not pale.     Findings: No rash.  Neurological:     Mental Status: He is alert and oriented to person, place, and time.  Psychiatric:        Behavior: Behavior normal.     ED Results / Procedures / Treatments   Labs (all labs ordered are listed, but only abnormal results are displayed) Labs Reviewed  CBC WITH DIFFERENTIAL/PLATELET - Abnormal; Notable for the following components:      Result Value   WBC 12.3 (*)    RBC 3.51 (*)    Hemoglobin 11.0 (*)    HCT 33.3 (*)    Neutro Abs 9.9 (*)    All other components within normal limits  COMPREHENSIVE METABOLIC PANEL - Abnormal; Notable for the following components:   Potassium 3.4 (*)    Glucose, Bld 119 (*)    ALT 61 (*)    All other components within normal limits  I-STAT CHEM 8, ED - Abnormal; Notable for the following components:   Glucose, Bld 116 (*)    Hemoglobin 10.9 (*)    HCT 32.0 (*)    All other components within normal limits  LIPASE, BLOOD  URINALYSIS, ROUTINE W REFLEX MICROSCOPIC    EKG None  Radiology CT ABDOMEN PELVIS W CONTRAST  Result Date: 11/12/2021 CLINICAL DATA:  Acute abdominal pain. 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 Status post cholecystectomy. * Tracking Code: BO * EXAM: CT ABDOMEN AND PELVIS WITH CONTRAST TECHNIQUE: Multidetector CT imaging of the abdomen and pelvis was performed using the standard protocol following bolus administration of intravenous contrast. RADIATION DOSE REDUCTION: This exam was performed according to the departmental dose-optimization program which includes automated exposure control, adjustment of the mA and/or kV according to patient size and/or use of iterative reconstruction technique.  CONTRAST:  157m OMNIPAQUE IOHEXOL 300 MG/ML  SOLN COMPARISON:  11/07/2021 FINDINGS: Lower chest: Small right pleural effusion. Subsegmental atelectasis noted within the right lower lobe. Hepatobiliary: There are multiple low-attenuation liver lesions compatible with metastatic disease. Index lesions include: -segment 7 lesion measures 1.6 cm, image 17/3. Unchanged from previous exam. -segment 5 lesion measures 1.7 cm, image 39/3. Previously 1.6 cm. Status post cholecystectomy. Percutaneous drainage catheter is identified between the gallbladder fossa and common bile duct. Soft tissue stranding is identified within the gallbladder fossa and along the medial margin of the  right hepatic lobe. This is nonspecific in the early postoperative time frame. No focal fluid collections identified to suggest drainable abscess or biloma. Common bile duct stent is in place. Pneumobilia is identified compatible with biliary patency. Pancreas: The known pancreatic lesion is suboptimally visualized due to phase of contrast opacification. This measures approximately 2.5 x 2.1 cm, image 42/3. On the previous MR from 07/02/2021 this measured approximately 3 x 2.0 cm. Dilatation of the main duct is again noted involving the body and tail of pancreas. Spleen: Normal in size without focal abnormality. Adrenals/Urinary Tract: Normal adrenal glands. No nephrolithiasis, hydronephrosis or mass. Urinary bladder is unremarkable. Stomach/Bowel: Stomach appears within normal limits. No pathologic dilatation of the large or small bowel loops. The appendix is visualized and appears normal. Vascular/Lymphatic: Normal appearance of the abdominal aorta. 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 The portal vein, superior mesenteric vein and splenic vein remain patent however. Upper abdominal adenopathy is again noted. Aortocaval lymph node appears similar to the previous exam measuring 3.6  x 1.6 cm, image 43/3. Retrocaval lymph node measures 1.4 x 1.5 cm, image 48/3. Reproductive: Prostate is unremarkable. Other: Trace free fluid noted within the dependent portion of the pelvis, image 94/3. Soft tissue stranding within the right upper quadrant extends into the right retroperitoneum. No discrete fluid collections identified to suggest drainable abscess. No significant pneumoperitoneum. Musculoskeletal: No acute or significant osseous findings. IMPRESSION: 1. Status post cholecystectomy. Percutaneous drainage catheter is identified between the gallbladder fossa and common bile duct. Soft tissue stranding is identified within the gallbladder fossa and along the medial margin of the right hepatic lobe. This is nonspecific in the early postoperative time frame. No focal fluid collections identified to suggest drainable abscess or biloma. 2. The known pancreatic lesion is suboptimally visualized due to phase of contrast opacification. Grossly similar to the previous exam. 3. Upper abdominal adenopathy is again noted compatible with metastatic disease. Not significantly changed. 4. Multiple low-attenuation liver lesions compatible with metastatic disease. Also not significantly changed from previous exam. 5. Small right pleural effusion with right lower lobe subsegmental atelectasis. Electronically Signed   By: Kerby Moors M.D.   On: 11/12/2021 10:59    Procedures Procedures    Medications Ordered in ED Medications  sodium chloride 0.9 % bolus 1,000 mL (0 mLs Intravenous Stopped 11/12/21 1035)  morphine (PF) 4 MG/ML injection 4 mg (4 mg Intravenous Given 11/12/21 0915)  ondansetron (ZOFRAN) injection 4 mg (4 mg Intravenous Given 11/12/21 0914)  iohexol (OMNIPAQUE) 300 MG/ML solution 100 mL (100 mLs Intravenous Contrast Given 11/12/21 1025)    ED Course/ Medical Decision Making/ A&P                           Medical Decision Making Amount and/or Complexity of Data Reviewed Labs:  ordered. Radiology: ordered.  Risk Prescription drug management.   31 yo M with a significant past medical history of neuroendocrine cancer recently status post cholecystectomy with a JP drain in place comes in with right low back pain.  The history and physical exam this is most likely musculoskeletal however with patient recently having surgery 5 days ago has a JP drain in place will obtain CT imaging treat pain and nausea blood work UA reassess.  Patient's blood work with a leukocytosis that he chronically has leukocytosis and its quite down from where it normally is.  No change to his renal function  LFTs and lipase unremarkable.  CT scan of the abdomen pelvis with the JP drain in good position, no obvious concerning finding in the right low back.  He does have continued findings of a lesion to his pancreas upper abdominal adenopathy not consistent with his right lower back pain.  I did discuss results with the patient and family.  I discussed with them about urine testing, they are currently declining and would like to go home at this time.  We will follow-up with her family doctor.  11:17 AM:  I have discussed the diagnosis/risks/treatment options with the patient and family.  Evaluation and diagnostic testing in the emergency department does not suggest an emergent condition requiring admission or immediate intervention beyond what has been performed at this time.  They will follow up with PCP. We also discussed returning to the ED immediately if new or worsening sx occur. We discussed the sx which are most concerning (e.g., sudden worsening pain, fever, inability to tolerate by mouth, cauda equina s/sx) that necessitate immediate return. Medications administered to the patient during their visit and any new prescriptions provided to the patient are listed below.  Medications given during this visit Medications  sodium chloride 0.9 % bolus 1,000 mL (0 mLs Intravenous Stopped 11/12/21 1035)   morphine (PF) 4 MG/ML injection 4 mg (4 mg Intravenous Given 11/12/21 0915)  ondansetron (ZOFRAN) injection 4 mg (4 mg Intravenous Given 11/12/21 0914)  iohexol (OMNIPAQUE) 300 MG/ML solution 100 mL (100 mLs Intravenous Contrast Given 11/12/21 1025)     The patient appears reasonably screen and/or stabilized for discharge and I doubt any other medical condition or other [facility] requiring further screening, evaluation, or treatment in the ED at this time prior to discharge.          Final Clinical Impression(s) / ED Diagnoses Final diagnoses:  Acute right-sided low back pain without sciatica    Rx / DC Orders ED Discharge Orders          Ordered    diclofenac Sodium (VOLTAREN) 1 % GEL  4 times daily        11/12/21 1115              Deno Etienne, DO 11/12/21 1117

## 2021-11-12 NOTE — Discharge Instructions (Signed)
Your back pain is most likely due to a muscular strain.  There is been a lot of research on back pain, unfortunately the only thing that seems to really help is Tylenol and ibuprofen.  Relative rest is also important to not lift greater than 10 pounds bending or twisting at the waist.  Please follow-up with your family physician.  The other thing that really seems to benefit patients is physical therapy which your doctor may send you for.  Please return to the emergency department for new numbness or weakness to your arms or legs. Difficulty with urinating or urinating or pooping on yourself.  Also if you cannot feel toilet paper when you wipe or get a fever.   Use the gel as prescribed Also take tylenol 1000mg(2 extra strength) four times a day.   

## 2021-11-12 NOTE — ED Triage Notes (Signed)
Patient c/o right lower back pain since yesterday. Patient had a cholecystectomy 5 days ago and has a drain present on the right abdomen. Patient denies any N/v/d

## 2021-11-14 ENCOUNTER — Other Ambulatory Visit: Payer: Self-pay

## 2021-11-15 ENCOUNTER — Encounter (HOSPITAL_COMMUNITY): Payer: Self-pay | Admitting: Emergency Medicine

## 2021-11-15 ENCOUNTER — Emergency Department (HOSPITAL_COMMUNITY)
Admission: EM | Admit: 2021-11-15 | Discharge: 2021-11-15 | Disposition: A | Discharge status: Home / Self Care | Payer: 59 | Attending: Emergency Medicine | Admitting: Emergency Medicine

## 2021-11-15 ENCOUNTER — Other Ambulatory Visit: Payer: Self-pay

## 2021-11-15 DIAGNOSIS — Z7951 Long term (current) use of inhaled steroids: Secondary | ICD-10-CM | POA: Insufficient documentation

## 2021-11-15 DIAGNOSIS — Z8589 Personal history of malignant neoplasm of other organs and systems: Secondary | ICD-10-CM | POA: Insufficient documentation

## 2021-11-15 DIAGNOSIS — R1084 Generalized abdominal pain: Secondary | ICD-10-CM | POA: Diagnosis present

## 2021-11-15 DIAGNOSIS — J45909 Unspecified asthma, uncomplicated: Secondary | ICD-10-CM | POA: Diagnosis not present

## 2021-11-15 LAB — CBC WITH DIFFERENTIAL/PLATELET
Abs Immature Granulocytes: 0.06 10*3/uL (ref 0.00–0.07)
Basophils Absolute: 0 10*3/uL (ref 0.0–0.1)
Basophils Relative: 0 %
Eosinophils Absolute: 0.6 10*3/uL — ABNORMAL HIGH (ref 0.0–0.5)
Eosinophils Relative: 4 %
HCT: 35.9 % — ABNORMAL LOW (ref 39.0–52.0)
Hemoglobin: 12 g/dL — ABNORMAL LOW (ref 13.0–17.0)
Immature Granulocytes: 1 %
Lymphocytes Relative: 13 %
Lymphs Abs: 1.6 10*3/uL (ref 0.7–4.0)
MCH: 31.7 pg (ref 26.0–34.0)
MCHC: 33.4 g/dL (ref 30.0–36.0)
MCV: 95 fL (ref 80.0–100.0)
Monocytes Absolute: 1.1 10*3/uL — ABNORMAL HIGH (ref 0.1–1.0)
Monocytes Relative: 8 %
Neutro Abs: 9.3 10*3/uL — ABNORMAL HIGH (ref 1.7–7.7)
Neutrophils Relative %: 74 %
Platelets: 429 10*3/uL — ABNORMAL HIGH (ref 150–400)
RBC: 3.78 MIL/uL — ABNORMAL LOW (ref 4.22–5.81)
RDW: 13.6 % (ref 11.5–15.5)
WBC: 12.6 10*3/uL — ABNORMAL HIGH (ref 4.0–10.5)
nRBC: 0 % (ref 0.0–0.2)

## 2021-11-15 LAB — COMPREHENSIVE METABOLIC PANEL
ALT: 52 U/L — ABNORMAL HIGH (ref 0–44)
AST: 23 U/L (ref 15–41)
Albumin: 3.7 g/dL (ref 3.5–5.0)
Alkaline Phosphatase: 81 U/L (ref 38–126)
Anion gap: 8 (ref 5–15)
BUN: 6 mg/dL (ref 6–20)
CO2: 24 mmol/L (ref 22–32)
Calcium: 8.8 mg/dL — ABNORMAL LOW (ref 8.9–10.3)
Chloride: 105 mmol/L (ref 98–111)
Creatinine, Ser: 0.72 mg/dL (ref 0.61–1.24)
GFR, Estimated: >60 mL/min (ref >60)
Glucose, Bld: 107 mg/dL — ABNORMAL HIGH (ref 70–99)
Potassium: 3.2 mmol/L — ABNORMAL LOW (ref 3.5–5.1)
Sodium: 137 mmol/L (ref 135–145)
Total Bilirubin: 0.6 mg/dL (ref 0.3–1.2)
Total Protein: 7.5 g/dL (ref 6.5–8.1)

## 2021-11-15 LAB — LIPASE, BLOOD: Lipase: 25 U/L (ref 11–51)

## 2021-11-15 MED ORDER — OXYCODONE HCL 5 MG PO TABS
10.0000 mg | ORAL_TABLET | Freq: Once | ORAL | Status: AC
  Administered 2021-11-15: 10 mg via ORAL
  Filled 2021-11-15: qty 2

## 2021-11-15 MED ORDER — MORPHINE SULFATE (PF) 4 MG/ML IV SOLN
4.0000 mg | Freq: Once | INTRAVENOUS | Status: AC
  Administered 2021-11-15: 4 mg via INTRAVENOUS
  Filled 2021-11-15: qty 1

## 2021-11-15 MED ORDER — ONDANSETRON HCL 4 MG/2ML IJ SOLN
4.0000 mg | Freq: Once | INTRAMUSCULAR | Status: AC
  Administered 2021-11-15: 4 mg via INTRAVENOUS
  Filled 2021-11-15: qty 2

## 2021-11-15 MED ORDER — HEPARIN SOD (PORK) LOCK FLUSH 100 UNIT/ML IV SOLN
500.0000 [IU] | Freq: Once | INTRAVENOUS | Status: AC
  Administered 2021-11-15: 500 [IU]
  Filled 2021-11-15: qty 5

## 2021-11-15 NOTE — Discharge Instructions (Signed)
You were seen today for abdominal pain.  Your drain appears to be draining appropriately and does not appear infected.  Your pain may be related to postoperative changes versus ongoing metastatic disease.  Follow-up closely with your oncologist and general surgeon.

## 2021-11-15 NOTE — ED Triage Notes (Signed)
Pt c/o right sided pain where his JP drain is from his cholecystectomy. Pt is supposed to have the drain removed Friday. Pt states oxycodone has not helped pain.

## 2021-11-15 NOTE — ED Provider Notes (Signed)
[facility] DEPT Provider Note   CSN: [id number] Arrival date & time: 11/15/21  0017     History  Chief Complaint  Patient presents with   Post-op Problem    Leon Fischer. is a 31 y.o. male.  HPI     This is a 31 year old male with history of metastatic neuroendocrine cancer, recent cholecystectomy who presents with abdominal pain.  Patient reports ongoing worsening abdominal pain.  He states he has had some pain since having cholecystectomy and drain placement approximately 1 week ago.  He has been taking oxycodone at home with minimal relief.  Reports that drain output has lessened and has not changed in character.  No fevers.  No purulence.  Patient reports pain all over.  No nausea, vomiting, change in bowel movements.  Chart reviewed.  Patient with metastatic neuroendocrine cancer.  He was seen and evaluated on 10/9 for similar symptoms in the ED.  He had a CT scan at that time that showed postoperative changes and known metastatic lesions.    Home Medications Prior to Admission medications   Medication Sig Start Date End Date Taking? Authorizing Provider  acetaminophen (TYLENOL) 500 MG tablet Take 1,000 mg by mouth every 6 (six) hours as needed for mild pain.   Yes [provider]  albuterol (VENTOLIN HFA) 108 (90 Base) MCG/ACT inhaler Inhale 2 puffs into the lungs every 4 (four) hours as needed for wheezing or shortness of breath. 10/30/21  Yes [name], MD  amoxicillin-clavulanate (AUGMENTIN) 875-125 MG tablet Take 1 tablet by mouth 2 (two) times daily for 7 days. 11/09/21 11/16/21 Yes Meuth, Brooke A, PA-C  dexamethasone (DECADRON) 4 MG tablet Take 2 tablets (8 mg total) by mouth daily. Start the day after chemotherapy for 1 day. 09/17/21  Yes [name], MD  diclofenac Sodium (VOLTAREN) 1 % GEL Apply 4 g topically 4 (four) times daily. Patient taking differently: Apply 4 g topically 4 (four) times daily  as needed (pain). 11/12/21  Yes Deno Etienne, DO  lidocaine-prilocaine (EMLA) cream Apply to affected area once Patient taking differently: Apply 1 Application topically daily as needed (access port). Apply to affected area once 07/23/21  Yes [name], MD  ondansetron (ZOFRAN) 8 MG tablet Take 1 tablet (8 mg total) by mouth 2 (two) times daily as needed for refractory nausea / vomiting. Start on day 3 after carboplatin chemo. 07/23/21  Yes [name], MD  oxyCODONE (OXY IR/ROXICODONE) 5 MG immediate release tablet Take 1 tablet (5 mg total) by mouth every 6 (six) hours as needed for severe pain. 11/09/21  Yes Meuth, Brooke A, PA-C  polyethylene glycol (MIRALAX / GLYCOLAX) 17 g packet Take 17 g by mouth daily. Patient taking differently: Take 17 g by mouth daily as needed for mild constipation or moderate constipation. 07/05/21  Yes Regalado, Belkys A, MD  prochlorperazine (COMPAZINE) 10 MG tablet Take 1 tablet (10 mg total) by mouth every 6 (six) hours as needed (Nausea or vomiting). 07/23/21  Yes [name], MD  senna-docusate (SENOKOT-S) 8.6-50 MG tablet Take 2 tablets by mouth 2 (two) times daily. Patient taking differently: Take 2 tablets by mouth daily as needed for mild constipation or moderate constipation. 07/05/21  Yes Regalado, Belkys A, MD      Allergies    Shellfish allergy and Other    Review of Systems   Review of Systems  Gastrointestinal:  Positive for abdominal pain. Negative for diarrhea, nausea and vomiting.  All other systems reviewed and are negative.   Physical Exam Updated Vital Signs BP (!) 130/93   Pulse 78   Temp 98.4 F (36.9 C)   Resp 20   Ht 1.829 m (6')   Wt (!) 149.7 kg   SpO2 98%   BMI 44.76 kg/m  Physical Exam Vitals and nursing note reviewed.  Constitutional:      Appearance: He is well-developed. He is obese.  HENT:     Head: Normocephalic and atraumatic.     Mouth/Throat:     Mouth: Mucous membranes are moist.  Eyes:      Pupils: Pupils are equal, round, and reactive to light.  Cardiovascular:     Rate and Rhythm: Normal rate and regular rhythm.     Heart sounds: Normal heart sounds. No murmur heard. Pulmonary:     Effort: Pulmonary effort is normal. No respiratory distress.     Breath sounds: Normal breath sounds. No wheezing.  Abdominal:     General: Bowel sounds are normal.     Palpations: Abdomen is soft.     Tenderness: There is no abdominal tenderness. There is no rebound.     Comments: JP drain right mid abdomen, site clean dry and intact, no significant erythema, serosanguineous drainage noted in the drain, no adjacent tenderness to palpation or fluctuance, generalized tenderness to palpation of the abdomen without rebound or guarding  Musculoskeletal:     Cervical back: Neck supple.  Lymphadenopathy:     Cervical: No cervical adenopathy.  Skin:    General: Skin is warm and dry.  Neurological:     Mental Status: He is alert and oriented to person, place, and time.  Psychiatric:        Mood and Affect: Mood normal.     ED Results / Procedures / Treatments   Labs (all labs ordered are listed, but only abnormal results are displayed) Labs Reviewed  CBC WITH DIFFERENTIAL/PLATELET - Abnormal; Notable for the following components:      Result Value   WBC 12.6 (*)    RBC 3.78 (*)    Hemoglobin 12.0 (*)    HCT 35.9 (*)    Platelets 429 (*)    Neutro Abs 9.3 (*)    Monocytes Absolute 1.1 (*)    Eosinophils Absolute 0.6 (*)    All other components within normal limits  COMPREHENSIVE METABOLIC PANEL - Abnormal; Notable for the following components:   Potassium 3.2 (*)    Glucose, Bld 107 (*)    Calcium 8.8 (*)    ALT 52 (*)    All other components within normal limits  LIPASE, BLOOD    EKG None  Radiology No results found.  Procedures Procedures    Medications Ordered in ED Medications  oxyCODONE (Oxy IR/ROXICODONE) immediate release tablet 10 mg (has no administration in  time range)  morphine (PF) 4 MG/ML injection 4 mg (4 mg Intravenous Given 11/15/21 0141)  ondansetron (ZOFRAN) injection 4 mg (4 mg Intravenous Given 11/15/21 0144)    ED Course/ Medical Decision Making/ A&P                           Medical Decision Making Amount and/or Complexity of Data Reviewed Labs: ordered.  Risk Prescription drug management.   This patient presents to the ED for concern of abdominal pain, this involves an extensive number of treatment options, and is a complaint that carries with it a high risk of  complications and morbidity.  I considered the following differential and admission for this acute, potentially life threatening condition.  The differential diagnosis includes postoperative complication such as infection or biliary leak, metastatic disease, appendicitis  MDM:    This is a 31 year old male who presents with abdominal pain.  History of neuroendocrine cancer with metastasis as well as recent cholecystectomy and JP drain placement.  He is nontoxic and vital signs are largely reassuring.  JP drain appears well placed and without signs of overt infection.  He had a CT scan 48 hours ago that was largely reassuring and showed postoperative changes.  His abdominal exam does not show any signs of peritonitis.  Labs obtained and reviewed.  These are at his baseline.  Suspect his pain may be related to ongoing metastatic disease versus postoperative pain.  He was given pain and nausea medication with some improvement.  Do not feel he needs repeat CT imaging at this time as I have low suspicion for acute infection.  Recommend he follow-up with oncology and general surgery.  He may need titration of his pain medications giving ongoing cancer and metastatic disease.  (Labs, imaging, consults)  Labs: I Ordered, and personally interpreted labs.  The pertinent results include: CBC, CMP, lipase  Imaging Studies ordered: I ordered imaging studies including prior CT  imaging I independently visualized and interpreted imaging. I agree with the radiologist interpretation  Additional history obtained from other at bedside.  External records from outside source obtained and reviewed including prior evaluations and operative reports  Cardiac Monitoring: The patient was maintained on a cardiac monitor.  I personally viewed and interpreted the cardiac monitored which showed an underlying rhythm of: Sinus rhythm  Reevaluation: After the interventions noted above, I reevaluated the patient and found that they have :improved  Social Determinants of Health: Lives independently  Disposition: Discharge  Co morbidities that complicate the patient evaluation  Past Medical History:  Diagnosis Date   Asthma    Neuroendocrine cancer ([place])      Medicines Meds ordered this encounter  Medications   morphine (PF) 4 MG/ML injection 4 mg   ondansetron (ZOFRAN) injection 4 mg   oxyCODONE (Oxy IR/ROXICODONE) immediate release tablet 10 mg   heparin lock flush 100 unit/mL    I have reviewed the patients home medicines and have made adjustments as needed  Problem List / ED Course: Problem List Items Addressed This Visit   None Visit Diagnoses     Generalized abdominal pain    -  Primary                   Final Clinical Impression(s) / ED Diagnoses Final diagnoses:  Generalized abdominal pain    Rx / DC Orders ED Discharge Orders     None         Tasheema Perrone, Barbette Hair, MD 11/15/21 0300

## 2021-11-17 ENCOUNTER — Other Ambulatory Visit: Payer: Self-pay | Admitting: Hematology

## 2021-11-17 DIAGNOSIS — C7A1 Malignant poorly differentiated neuroendocrine tumors: Secondary | ICD-10-CM

## 2021-11-19 ENCOUNTER — Other Ambulatory Visit: Payer: Self-pay

## 2021-11-19 ENCOUNTER — Encounter: Payer: Self-pay | Admitting: Hematology

## 2021-11-19 DIAGNOSIS — C7A1 Malignant poorly differentiated neuroendocrine tumors: Secondary | ICD-10-CM

## 2021-11-19 MED ORDER — OXYCODONE HCL 5 MG PO TABS: 5.0000 mg | ORAL_TABLET | Freq: Four times a day (QID) | ORAL | 0 refills | Status: DC | PRN

## 2021-11-23 ENCOUNTER — Telehealth: Payer: Self-pay | Admitting: *Deleted

## 2021-11-23 NOTE — Telephone Encounter (Signed)
11/22/2021 Late entry: [employer] paperwork completed by this nurse at this time.  Folder placed in designated mail bin for collaborative pick up for provider review, signature, and return.

## 2021-11-26 ENCOUNTER — Other Ambulatory Visit: Payer: Self-pay

## 2021-11-26 DIAGNOSIS — C7A1 Malignant poorly differentiated neuroendocrine tumors: Secondary | ICD-10-CM

## 2021-11-27 ENCOUNTER — Telehealth: Payer: Self-pay

## 2021-11-27 ENCOUNTER — Inpatient Hospital Stay: Payer: 59 | Attending: Hematology

## 2021-11-27 ENCOUNTER — Encounter: Payer: Self-pay | Admitting: Hematology

## 2021-11-27 ENCOUNTER — Inpatient Hospital Stay (HOSPITAL_BASED_OUTPATIENT_CLINIC_OR_DEPARTMENT_OTHER): Payer: 59 | Admitting: Hematology

## 2021-11-27 ENCOUNTER — Other Ambulatory Visit: Payer: Self-pay

## 2021-11-27 VITALS — BP 148/99 | HR 91 | Temp 97.7°F | Resp 18 | Ht 72.0 in | Wt 319.0 lb

## 2021-11-27 DIAGNOSIS — Z5111 Encounter for antineoplastic chemotherapy: Secondary | ICD-10-CM | POA: Insufficient documentation

## 2021-11-27 DIAGNOSIS — C7A1 Malignant poorly differentiated neuroendocrine tumors: Secondary | ICD-10-CM

## 2021-11-27 DIAGNOSIS — C7A8 Other malignant neuroendocrine tumors: Secondary | ICD-10-CM | POA: Insufficient documentation

## 2021-11-27 DIAGNOSIS — R7989 Other specified abnormal findings of blood chemistry: Secondary | ICD-10-CM

## 2021-11-27 DIAGNOSIS — C7B8 Other secondary neuroendocrine tumors: Secondary | ICD-10-CM | POA: Diagnosis not present

## 2021-11-27 LAB — CMP (CANCER CENTER ONLY)
ALT: 27 U/L (ref 0–44)
AST: 16 U/L (ref 15–41)
Albumin: 4.2 g/dL (ref 3.5–5.0)
Alkaline Phosphatase: 82 U/L (ref 38–126)
Anion gap: 7 (ref 5–15)
BUN: 9 mg/dL (ref 6–20)
CO2: 28 mmol/L (ref 22–32)
Calcium: 9.6 mg/dL (ref 8.9–10.3)
Chloride: 104 mmol/L (ref 98–111)
Creatinine: 0.87 mg/dL (ref 0.61–1.24)
GFR, Estimated: >60 mL/min (ref >60)
Glucose, Bld: 146 mg/dL — ABNORMAL HIGH (ref 70–99)
Potassium: 3.8 mmol/L (ref 3.5–5.1)
Sodium: 139 mmol/L (ref 135–145)
Total Bilirubin: 0.4 mg/dL (ref 0.3–1.2)
Total Protein: 8 g/dL (ref 6.5–8.1)

## 2021-11-27 LAB — CBC WITH DIFFERENTIAL (CANCER CENTER ONLY)
Abs Immature Granulocytes: 0.01 10*3/uL (ref 0.00–0.07)
Basophils Absolute: 0 10*3/uL (ref 0.0–0.1)
Basophils Relative: 0 %
Eosinophils Absolute: 0.3 10*3/uL (ref 0.0–0.5)
Eosinophils Relative: 3 %
HCT: 37.1 % — ABNORMAL LOW (ref 39.0–52.0)
Hemoglobin: 12.4 g/dL — ABNORMAL LOW (ref 13.0–17.0)
Immature Granulocytes: 0 %
Lymphocytes Relative: 15 %
Lymphs Abs: 1.3 10*3/uL (ref 0.7–4.0)
MCH: 31 pg (ref 26.0–34.0)
MCHC: 33.4 g/dL (ref 30.0–36.0)
MCV: 92.8 fL (ref 80.0–100.0)
Monocytes Absolute: 0.5 10*3/uL (ref 0.1–1.0)
Monocytes Relative: 6 %
Neutro Abs: 6.3 10*3/uL (ref 1.7–7.7)
Neutrophils Relative %: 76 %
Platelet Count: 293 10*3/uL (ref 150–400)
RBC: 4 MIL/uL — ABNORMAL LOW (ref 4.22–5.81)
RDW: 13 % (ref 11.5–15.5)
WBC Count: 8.4 10*3/uL (ref 4.0–10.5)
nRBC: 0 % (ref 0.0–0.2)

## 2021-11-27 MED ORDER — OXYCODONE HCL 5 MG PO TABS: 5.0000 mg | ORAL_TABLET | Freq: Four times a day (QID) | ORAL | 0 refills | Status: DC | PRN

## 2021-11-27 NOTE — Progress Notes (Signed)
Patient went back to cancer center to complete grant paperwork and obtain gift card.  Patient approved for one-time $1000 [employer] grant to assist with personal expenses while going through treatment. He was given a copy of the approval letter and expense sheet.  He has my card for any additional financial questions or concerns.

## 2021-11-27 NOTE — Telephone Encounter (Signed)
Notified Patient of prior authorization approval for Oxycodone HCL '5mg'$  Tablets. Medication is approved through 11/28/2022.

## 2021-11-27 NOTE — Progress Notes (Signed)
Correction to previous note.  Patient was did not sign and receive a copy of paperwork and gift card as the registrar they were working with had stepped away from the desk per his mother.  Advised this can be completed at next scheduled appointment.

## 2021-11-27 NOTE — Progress Notes (Signed)
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  HISTORY OF PRESENTING ILLNESS:  Leon Fischer. is a wonderful 31 y.o. 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 He presents today accompanied by his mother. He reports He is doing well.  He reports no color change in stool.  He notes that he is eating well but feels food just sits in stomach.  We discussed getting additional scans and labs for further evaluation and treatment which he was agreeable to. We further discussed getting MRI of brain and PET scan which he was agreeable to.  He notes that he is trying to hold it together mentally and is still trying to process his new diagnosis. We discussed the options of psychological or spiritual counseling which he has not decided on at this time. He was advised that may call back if he decides that he would like to pursue either.  We discussed getting a port a cath installed which he was agreeable to.  He recently had a biliary stent put into common bile duct.   No abdominal pain or change in bowel habits. No new or unexpected weight loss. No other new or acute focal symptoms.  We discussed his recent stomach biopsy done 07/13/2021 which sowed gastropathy and minor chronic gastritis with lymphoid aggregate. Negative for H. Pylori.  We discussed recent cytology done 07/13/2021 which showed presence of malignant cells in pancreas and poorly differentiated/high grade neuroendocrine carcinoma.  We discussed upper endoscopy done 07/13/2021 revealed mass in  pancreatic head and some enlarged lymph nodes were seen in the celiac and peripancreatic regions.  Labs done today were reviewed in detail.  INTERVAL HISTORY:  Leon Fischer. Is a 31 y.o. 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 He reports He is doing well with no new symptoms or concerns.  We discussed his recent ED admission 11/07/2021 where he presented with acute cholecystitis with sepsis on admission which prompted a cholecystectomy. Otherwise resolved.  He notes he is eating well.  We discussed next line treatment options including just taking a conservative approach and monitoring. We also discussed getting an additional opinion from [facility] for additional treatment options following C6 of treatment which he is agreeable to.  He reports he is tolerating the carboplatin etoposide chemotherapy well with no prohibitive toxicities at this time.  His liver enzymes have normalized today likely due to cholecystectomy.  No leg swelling. No new or unexpected weight loss. No abdominal pain or distension. No new neuropathy. No other new or acute focal symptoms.  Labs done today were reviewed in detail.  MEDICAL HISTORY:  Past Medical History:  Diagnosis Date   Asthma    Neuroendocrine cancer ([place])     SURGICAL HISTORY: Past Surgical History:  Procedure Laterality Date   BILIARY DILATION  07/13/2021   Procedure: BILIARY DILATION;  Surgeon: Clarene Essex, MD;  Location: [facility];  Service: Gastroenterology;;   BILIARY STENT PLACEMENT N/A 07/13/2021   Procedure: BILIARY STENT PLACEMENT;  Surgeon: Clarene Essex, MD;  Location: [name];  Service: Gastroenterology;  Laterality: N/A;   BILIARY STENT PLACEMENT N/A 11/05/2021   Procedure:  BILIARY STENT PLACEMENT;  Surgeon: Clarene Essex, MD;  Location: [facility];  Service: Gastroenterology;  Laterality: N/A;   BIOPSY  07/13/2021   Procedure: BIOPSY;  Surgeon:  Rush Landmark Telford Nab., MD;  Location: [facility];  Service: Gastroenterology;;   CHOLECYSTECTOMY N/A 11/07/2021   Procedure: LAPAROSCOPIC CHOLECYSTECTOMY;  Surgeon: Clovis Riley, MD;  Location: [facility];  Service: General;  Laterality: N/A;   ENDOSCOPIC RETROGRADE CHOLANGIOPANCREATOGRAPHY (ERCP) WITH PROPOFOL N/A 07/13/2021   Procedure: ENDOSCOPIC RETROGRADE CHOLANGIOPANCREATOGRAPHY (ERCP) WITH PROPOFOL;  Surgeon: Clarene Essex, MD;  Location: [name];  Service: Gastroenterology;  Laterality: N/A;   ERCP N/A 11/05/2021   Procedure: ENDOSCOPIC RETROGRADE CHOLANGIOPANCREATOGRAPHY (ERCP);  Surgeon: Clarene Essex, MD;  Location: [facility];  Service: Gastroenterology;  Laterality: N/A;   ESOPHAGOGASTRODUODENOSCOPY (EGD) WITH PROPOFOL N/A 07/13/2021   Procedure: ESOPHAGOGASTRODUODENOSCOPY (EGD) WITH PROPOFOL;  Surgeon: Rush Landmark Telford Nab., MD;  Location: [name];  Service: Gastroenterology;  Laterality: N/A;   EUS N/A 07/13/2021   Procedure: UPPER ENDOSCOPIC ULTRASOUND (EUS) LINEAR;  Surgeon: Irving Copas., MD;  Location: [name];  Service: Gastroenterology;  Laterality: N/A;   FINE NEEDLE ASPIRATION  07/13/2021   Procedure: FINE NEEDLE ASPIRATION (FNA) LINEAR;  Surgeon: Irving Copas., MD;  Location: [facility];  Service: Gastroenterology;;   IR IMAGING GUIDED PORT INSERTION  07/31/2021   SPHINCTEROTOMY  07/13/2021   Procedure: Joan Mayans;  Surgeon: Clarene Essex, MD;  Location: [name];  Service: Gastroenterology;;    SOCIAL HISTORY: Social History   Socioeconomic History   Marital status: Single    Spouse name: Not on file   Number of children: 0   Years of education: Not on file   Highest education level: Some college, no degree  Occupational History   Not on file  Tobacco Use   Smoking status: Some Days    Types: Cigarettes   Smokeless tobacco: Never  Vaping Use   Vaping Use: Never used  Substance and Sexual Activity   Alcohol use: Yes     Comment: social   Drug use: No   Sexual activity: Never  Other Topics Concern   Not on file  Social History Narrative   Not on file   Social Determinants of Health   Financial Resource Strain: Low Risk  (04/14/2017)   Overall Financial Resource Strain (CARDIA)    Difficulty of Paying Living Expenses: Not hard at all  Food Insecurity: No Food Insecurity (11/07/2021)   Hunger Vital Sign    Worried About Running Out of Food in the Last Year: Never true    Ran Out of Food in the Last Year: Never true  Transportation Needs: No Transportation Needs (11/07/2021)   PRAPARE - Hydrologist (Medical): No    Lack of Transportation (Non-Medical): No  Physical Activity: Inactive (04/14/2017)   Exercise Vital Sign    Days of Exercise per Week: 0 days    Minutes of Exercise per Session: 0 min  Stress: Stress Concern Present (04/14/2017)   [place]    Feeling of Stress : Rather much  Social Connections: Moderately Isolated (04/14/2017)   Social Connection and Isolation Panel [NHANES]    Frequency of Communication with Friends and Family: More than three times a week    Frequency of Social Gatherings with Friends and Family: More than three times a week    Attends Religious Services: Never    Marine scientist or Organizations: No    Attends [employer]  or Organization Meetings: Never    Marital Status: Never married  Intimate Partner Violence: Not At Risk (11/07/2021)   Humiliation, Afraid, Rape, and Kick questionnaire    Fear of Current or Ex-Partner: No    Emotionally Abused: No    Physically Abused: No    Sexually Abused: No    FAMILY HISTORY: Family History  Problem Relation Age of Onset   Drug abuse Father    ALLERGIES:  is allergic to shellfish allergy and other.  MEDICATIONS:  Current Outpatient Medications  Medication Sig Dispense Refill   acetaminophen (TYLENOL) 500 MG tablet Take  1,000 mg by mouth every 6 (six) hours as needed for mild pain.     albuterol (VENTOLIN HFA) 108 (90 Base) MCG/ACT inhaler INHALE 2 PUFFS BY MOUTH EVERY 4 HOURS AS NEEDED FOR WHEEZING FOR SHORTNESS OF BREATH 18 g 0   dexamethasone (DECADRON) 4 MG tablet Take 2 tablets (8 mg total) by mouth daily. Start the day after chemotherapy for 1 day. 30 tablet 1   diclofenac Sodium (VOLTAREN) 1 % GEL Apply 4 g topically 4 (four) times daily. (Patient taking differently: Apply 4 g topically 4 (four) times daily as needed (pain).) 100 g 0   lidocaine-prilocaine (EMLA) cream Apply to affected area once (Patient taking differently: Apply 1 Application topically daily as needed (access port). Apply to affected area once) 30 g 3   ondansetron (ZOFRAN) 8 MG tablet Take 1 tablet (8 mg total) by mouth 2 (two) times daily as needed for refractory nausea / vomiting. Start on day 3 after carboplatin chemo. 30 tablet 1   oxyCODONE (OXY IR/ROXICODONE) 5 MG immediate release tablet Take 1 tablet (5 mg total) by mouth every 6 (six) hours as needed for severe pain or moderate pain. 20 tablet 0   polyethylene glycol (MIRALAX / GLYCOLAX) 17 g packet Take 17 g by mouth daily. (Patient taking differently: Take 17 g by mouth daily as needed for mild constipation or moderate constipation.) 14 each 0   prochlorperazine (COMPAZINE) 10 MG tablet Take 1 tablet (10 mg total) by mouth every 6 (six) hours as needed (Nausea or vomiting). 30 tablet 1   senna-docusate (SENOKOT-S) 8.6-50 MG tablet Take 2 tablets by mouth 2 (two) times daily. (Patient taking differently: Take 2 tablets by mouth daily as needed for mild constipation or moderate constipation.) 30 tablet 0   No current facility-administered medications for this visit.    REVIEW OF SYSTEMS:    10 Point review of Systems was done is negative except as noted above.  PHYSICAL EXAMINATION: ECOG PERFORMANCE STATUS: 1 - Symptomatic but completely ambulatory  . Vitals:   11/27/21  1256  BP: (!) 148/99  Pulse: 91  Resp: 18  Temp: 97.7 F (36.5 C)  SpO2: 100%     .Body mass index is 43.26 kg/m. NAD GENERAL:alert, in no acute distress and comfortable SKIN: no acute rashes, no significant lesions EYES: conjunctiva are pink and non-injected, sclera anicteric NECK: supple, no JVD LYMPH:  no palpable lymphadenopathy in the cervical, axillary or inguinal regions LUNGS: clear to auscultation b/l with normal respiratory effort HEART: regular rate & rhythm ABDOMEN:  normoactive bowel sounds , non tender, not distended. Extremity: no pedal edema PSYCH: alert & oriented x 3 with fluent speech NEURO: no focal motor/sensory deficits  LABORATORY DATA:  I have reviewed the data as listed  .    Latest Ref Rng & Units 11/27/2021   12:21 PM 11/15/2021    1:46 AM 11/12/2021  9:26 AM  CBC  WBC 4.0 - 10.5 K/uL 8.4  12.6    Hemoglobin 13.0 - 17.0 g/dL 12.4  12.0  10.9   Hematocrit 39.0 - 52.0 % 37.1  35.9  32.0   Platelets 150 - 400 K/uL 293  429      .    Latest Ref Rng & Units 11/27/2021   12:21 PM 11/15/2021    1:46 AM 11/12/2021    9:26 AM  CMP  Glucose 70 - 99 mg/dL 146  107  116   BUN 6 - 20 mg/dL '9  6  9   '$ Creatinine 0.61 - 1.24 mg/dL 0.87  0.72  0.70   Sodium 135 - 145 mmol/L 139  137  139   Potassium 3.5 - 5.1 mmol/L 3.8  3.2  3.5   Chloride 98 - 111 mmol/L 104  105  103   CO2 22 - 32 mmol/L 28  24    Calcium 8.9 - 10.3 mg/dL 9.6  8.8    Total Protein 6.5 - 8.1 g/dL 8.0  7.5    Total Bilirubin 0.3 - 1.2 mg/dL 0.4  0.6    Alkaline Phos 38 - 126 U/L 82  81    AST 15 - 41 U/L 16  23    ALT 0 - 44 U/L 27  52     Cytology done 07/13/2021 revealed "FINAL MICROSCOPIC DIAGNOSIS:  A. PANCREAS, HEAD, FINE NEEDLE ASPIRATION:  - Malignant cells present  - Poorly differentiated/high-grade neuroendocrine carcinoma (see  comment)   B. CBD STRICTURE, BRUSHING:  - Atypical cells suspicious for tumor   C. BILIARY DILATION, BALLOON, REMOVAL:  - Atypical  cells suspicious for tumor "  Surgical pathology done 07/13/2021 revealed "FINAL MICROSCOPIC DIAGNOSIS:   A. STOMACH, BIOPSY:  Reactive gastropathy and minimal chronic gastritis with lymphoid  aggregate  Negative for H. pylori, intestinal metaplasia, dysplasia and carcinoma"  RADIOGRAPHIC STUDIES: I have personally reviewed the radiological images as listed and agreed with the findings in the report. CT ABDOMEN PELVIS W CONTRAST  Result Date: 11/12/2021 CLINICAL DATA:  Acute abdominal pain. 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 Status post cholecystectomy. * Tracking Code: BO * EXAM: CT ABDOMEN AND PELVIS WITH CONTRAST TECHNIQUE: Multidetector CT imaging of the abdomen and pelvis was performed using the standard protocol following bolus administration of intravenous contrast. RADIATION DOSE REDUCTION: This exam was performed according to the departmental dose-optimization program which includes automated exposure control, adjustment of the mA and/or kV according to patient size and/or use of iterative reconstruction technique. CONTRAST:  118m OMNIPAQUE IOHEXOL 300 MG/ML  SOLN COMPARISON:  11/07/2021 FINDINGS: Lower chest: Small right pleural effusion. Subsegmental atelectasis noted within the right lower lobe. Hepatobiliary: There are multiple low-attenuation liver lesions compatible with metastatic disease. Index lesions include: -segment 7 lesion measures 1.6 cm, image 17/3. Unchanged from previous exam. -segment 5 lesion measures 1.7 cm, image 39/3. Previously 1.6 cm. Status post cholecystectomy. Percutaneous drainage catheter is identified between the gallbladder fossa and common bile duct. Soft tissue stranding is identified within the gallbladder fossa and along the medial margin of the right hepatic lobe. This is nonspecific in the early postoperative time frame. No focal fluid collections identified to suggest drainable  abscess or biloma. Common bile duct stent is in place. Pneumobilia is identified compatible with biliary patency. Pancreas: The known pancreatic lesion is suboptimally visualized due to phase of contrast opacification. This measures approximately 2.5 x 2.1  cm, image 42/3. On the previous MR from 07/02/2021 this measured approximately 3 x 2.0 cm. Dilatation of the main duct is again noted involving the body and tail of pancreas. Spleen: Normal in size without focal abnormality. Adrenals/Urinary Tract: Normal adrenal glands. No nephrolithiasis, hydronephrosis or mass. Urinary bladder is unremarkable. Stomach/Bowel: Stomach appears within normal limits. No pathologic dilatation of the large or small bowel loops. The appendix is visualized and appears normal. Vascular/Lymphatic: Normal appearance of the abdominal aorta. 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 The portal vein, superior mesenteric vein and splenic vein remain patent however. Upper abdominal adenopathy is again noted. Aortocaval lymph node appears similar to the previous exam measuring 3.6 x 1.6 cm, image 43/3. Retrocaval lymph node measures 1.4 x 1.5 cm, image 48/3. Reproductive: Prostate is unremarkable. Other: Trace free fluid noted within the dependent portion of the pelvis, image 94/3. Soft tissue stranding within the right upper quadrant extends into the right retroperitoneum. No discrete fluid collections identified to suggest drainable abscess. No significant pneumoperitoneum. Musculoskeletal: No acute or significant osseous findings. IMPRESSION: 1. Status post cholecystectomy. Percutaneous drainage catheter is identified between the gallbladder fossa and common bile duct. Soft tissue stranding is identified within the gallbladder fossa and along the medial margin of the right hepatic lobe. This is nonspecific in the early postoperative time frame. No focal fluid collections  identified to suggest drainable abscess or biloma. 2. The known pancreatic lesion is suboptimally visualized due to phase of contrast opacification. Grossly similar to the previous exam. 3. Upper abdominal adenopathy is again noted compatible with metastatic disease. Not significantly changed. 4. Multiple low-attenuation liver lesions compatible with metastatic disease. Also not significantly changed from previous exam. 5. Small right pleural effusion with right lower lobe subsegmental atelectasis. Electronically Signed   By: Kerby Moors M.D.   On: 11/12/2021 10:59   CT ABDOMEN PELVIS W CONTRAST  Result Date: 11/07/2021 CLINICAL DATA:  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 Sudden onset of RIGHT upper quadrant pain last night, associated nausea at nonbilious nonbloody vomiting. EXAM: CT ANGIOGRAPHY CHEST CT ABDOMEN AND PELVIS WITH CONTRAST TECHNIQUE: Multidetector CT imaging of the chest was performed using the standard protocol during bolus administration of intravenous contrast. Multiplanar CT image reconstructions and MIPs were obtained to evaluate the vascular anatomy. Multidetector CT imaging of the abdomen and pelvis was performed using the standard protocol during bolus administration of intravenous contrast. RADIATION DOSE REDUCTION: This exam was performed according to the departmental dose-optimization program which includes automated exposure control, adjustment of the mA and/or kV according to patient size and/or use of iterative reconstruction technique. CONTRAST:  182m OMNIPAQUE IOHEXOL 350 MG/ML SOLN IV. No oral contrast. COMPARISON:  CT chest abdomen pelvis 06/02/2021 FINDINGS: CTA CHEST FINDINGS Cardiovascular: RIGHT jugular Port-A-Cath with tip in RIGHT atrium. Aorta normal caliber without aneurysm or dissection. No pericardial effusion. Suboptimal pulmonary arterial enhancement. No definite pulmonary emboli identified.  Mediastinum/Nodes: Esophagus unremarkable. Base of cervical region normal appearance. No thoracic adenopathy. BILATERAL gynecomastia present. Lungs/Pleura: Lungs clear. No pulmonary infiltrate, pleural effusion, or pneumothorax. Musculoskeletal: No osseous abnormalities. Review of the MIP images confirms the above findings. CT ABDOMEN and PELVIS FINDINGS Hepatobiliary: CBD stent. Gallbladder wall thickening and significant significant pericholecystic edema. Findings are highly concerning for acute cholecystitis. Four hepatic metastases are identified, 1 new and 3 increased in size. No intrahepatic biliary dilatation. Pancreas: Minimal edema adjacent to pancreatic head, may be  related to acute cholecystitis but cannot exclude subtle coexistent focal pancreatitis. Pancreatic ductal dilatation. Mass at head/uncinate process of pancreas associated with a calcification again identified, ill-defined but approximately 2.9 x 2.5 cm. Spleen: Normal appearance Adrenals/Urinary Tract: Adrenal glands normal appearance. Minimally inhomogeneous nephrogram upper lateral RIGHT kidney, nonspecific, could potentially be seen with pyelonephritis. No renal masses, hydronephrosis, hydroureter or urinary tract calcification. Bladder unremarkable. Stomach/Bowel: Edema adjacent to duodenum related to adjacent suspected cholecystitis and potentially subtle focal pancreatitis. Normal appendix. Long radiopaque foreign body question previously identified biliary stent within splenic flexure of colon. Stomach and remaining bowel loops normal appearance. Vascular/Lymphatic: Aorta normal caliber. Vascular structures patent. Retrocaval adenopathy unchanged. Reproductive: Unremarkable prostate gland and seminal vesicles Other: No free air or free fluid. Small umbilical hernia containing fat. Musculoskeletal: No osseous metastases seen. Review of the MIP images confirms the above findings. IMPRESSION: Suboptimal pulmonary arterial enhancement  without definite evidence of pulmonary embolism. No acute intrathoracic abnormalities. Post CBD stenting with gallbladder wall thickening and significant pericholecystic edema highly concerning for acute cholecystitis, biliary leak considered unlikely; consider radionuclide hepatobiliary imaging to assess for acute cholecystitis. Four hepatic metastases, 1 new and 3 increased in size since previous exam. Stable known mass at head/uncinate process of pancreas with pancreatic ductal dilatation. Minimal edema adjacent to pancreatic head, may be related to acute cholecystitis but cannot exclude subtle coexistent focal pancreatitis. Long radiopaque foreign body question previously identified biliary stent within splenic flexure of colon. Small umbilical hernia containing fat. Minimally inhomogeneous nephrogram upper lateral RIGHT kidney, nonspecific, could potentially be seen with pyelonephritis; recommend correlation with urinalysis. Electronically Signed   By: Lavonia Dana M.D.   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 Sudden onset of RIGHT upper quadrant pain last night, associated nausea at nonbilious nonbloody vomiting. EXAM: CT ANGIOGRAPHY CHEST CT ABDOMEN AND PELVIS WITH CONTRAST TECHNIQUE: Multidetector CT imaging of the chest was performed using the standard protocol during bolus administration of intravenous contrast. Multiplanar CT image reconstructions and MIPs were obtained to evaluate the vascular anatomy. Multidetector CT imaging of the abdomen and pelvis was performed using the standard protocol during bolus administration of intravenous contrast. RADIATION DOSE REDUCTION: This exam was performed according to the departmental dose-optimization program which includes automated exposure control, adjustment of the mA and/or kV according  to patient size and/or use of iterative reconstruction technique. CONTRAST:  119m OMNIPAQUE IOHEXOL 350 MG/ML SOLN IV. No oral contrast. COMPARISON:  CT chest abdomen pelvis 06/02/2021 FINDINGS: CTA CHEST FINDINGS Cardiovascular: RIGHT jugular Port-A-Cath with tip in RIGHT atrium. Aorta normal caliber without aneurysm or dissection. No pericardial effusion. Suboptimal pulmonary arterial enhancement. No definite pulmonary emboli identified. Mediastinum/Nodes: Esophagus unremarkable. Base of cervical region normal appearance. No thoracic adenopathy. BILATERAL gynecomastia present. Lungs/Pleura: Lungs clear. No pulmonary infiltrate, pleural effusion, or pneumothorax. Musculoskeletal: No osseous abnormalities. Review of the MIP images confirms the above findings. CT ABDOMEN and PELVIS FINDINGS Hepatobiliary: CBD stent. Gallbladder wall thickening and significant significant pericholecystic edema. Findings are highly concerning for acute cholecystitis. Four hepatic metastases are identified, 1 new and 3 increased in size. No intrahepatic biliary dilatation. Pancreas: Minimal edema adjacent to pancreatic head, may be related to acute cholecystitis but cannot exclude subtle coexistent focal pancreatitis. Pancreatic ductal dilatation. Mass at head/uncinate process of pancreas associated with a calcification again identified, ill-defined but approximately 2.9 x 2.5 cm.  Spleen: Normal appearance Adrenals/Urinary Tract: Adrenal glands normal appearance. Minimally inhomogeneous nephrogram upper lateral RIGHT kidney, nonspecific, could potentially be seen with pyelonephritis. No renal masses, hydronephrosis, hydroureter or urinary tract calcification. Bladder unremarkable. Stomach/Bowel: Edema adjacent to duodenum related to adjacent suspected cholecystitis and potentially subtle focal pancreatitis. Normal appendix. Long radiopaque foreign body question previously identified biliary stent within splenic flexure of colon.  Stomach and remaining bowel loops normal appearance. Vascular/Lymphatic: Aorta normal caliber. Vascular structures patent. Retrocaval adenopathy unchanged. Reproductive: Unremarkable prostate gland and seminal vesicles Other: No free air or free fluid. Small umbilical hernia containing fat. Musculoskeletal: No osseous metastases seen. Review of the MIP images confirms the above findings. IMPRESSION: Suboptimal pulmonary arterial enhancement without definite evidence of pulmonary embolism. No acute intrathoracic abnormalities. Post CBD stenting with gallbladder wall thickening and significant pericholecystic edema highly concerning for acute cholecystitis, biliary leak considered unlikely; consider radionuclide hepatobiliary imaging to assess for acute cholecystitis. Four hepatic metastases, 1 new and 3 increased in size since previous exam. Stable known mass at head/uncinate process of pancreas with pancreatic ductal dilatation. Minimal edema adjacent to pancreatic head, may be related to acute cholecystitis but cannot exclude subtle coexistent focal pancreatitis. Long radiopaque foreign body question previously identified biliary stent within splenic flexure of colon. Small umbilical hernia containing fat. Minimally inhomogeneous nephrogram upper lateral RIGHT kidney, nonspecific, could potentially be seen with pyelonephritis; recommend correlation with urinalysis. Electronically Signed   By: Lavonia Dana M.D.   On: 11/07/2021 11:24   DG Chest 1 View  Result Date: 11/07/2021 CLINICAL DATA:  Shortness of breath. Pain under RIGHT breast. Status post stent placement to pancreas on Monday. EXAM: CHEST  1 VIEW COMPARISON:  None Available. FINDINGS: Heart size and mediastinal contours are within normal limits. RIGHT chest wall Port-A-Cath in place with tip positioned at the level of the RIGHT atrium. Lungs are clear. No pleural effusion or pneumothorax is seen. IMPRESSION: No active disease. No evidence of pneumonia  or pulmonary edema. Electronically Signed   By: Franki Cabot M.D.   On: 11/07/2021 08:23   DG ERCP  Result Date: 11/05/2021 CLINICAL DATA:  History high-grade metastatic pancreatic neuroendocrine tumor EXAM: ERCP TECHNIQUE: Multiple spot images obtained with the fluoroscopic device and submitted for interpretation post-procedure. FLUOROSCOPY TIME: FLUOROSCOPY TIME 122 mGy COMPARISON.: CT the chest, abdomen pelvis-10/02/2021 FINDINGS: Twelve spot intraoperative fluoroscopic images of the right upper abdominal quadrant during ERCP are provided for review. Initial image demonstrates an ERCP probe overlying the right upper abdominal quadrant. There has been interval removal of internal biliary stent. Subsequent images demonstrate selective cannulation and opacification of the CBD with persistent narrowing/subtotal occlusion at its mid aspect (image 9) Subsequent images demonstrate insufflation of a balloon with in the distal aspect of the CBD Completion images demonstrate placement of a internal biliary stent traversing the area of narrowing/subtotal occlusion of the mid aspect of the CBD. IMPRESSION: ERCP with biliary stent placement as above. These images were submitted for radiologic interpretation only. Please see the procedural report for the amount of contrast and the fluoroscopy time utilized. Electronically Signed   By: Sandi Mariscal M.D.   On: 11/05/2021 13:28     ASSESSMENT & PLAN:   31 y.o. very pleasant male with  1. 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 -Cytology done 07/13/2021 shows presence of malignant cells in pancreas and poorly differentiated/high grade neuroendocrine carcinoma.  Plan -I discussed available labs with the patient  in details Labs done today were reviewed in detail. CBC shows Hgb is 12.4, WBC count of 8.4k, and platelet count of 293k. CMP is stable. Liver enzymes normalized  likely due to cholecystectomy and resolution of cholecystitis.  -Patient's chart reviewed in detail -patient with no significant toxicities from C5 of carboplatin/etoposide at this time. -will plan to get PET/CU-64 after 5 cycles of treatment to evaluate for residual disease activity and to determine octreotide receptor positivity to plan for subsequent treatments. -We discussed next line treatment options including just taking a conservative approach and monitoring.  -We also discussed getting an additional opinion from [facility] for additional treatment options following C6 of treatment   Follow up: -Plz schedule for delayed C5 (from 12/03/2021) and C6 of Carboplatin/Etoposide chemotherapy as per orders. -MD visit with C6D1 of treatment (not need with C5D1 of treatment)   .The total time spent in the appointment was 32 minutes* .  All of the patient's questions were answered with apparent satisfaction. The patient knows to call the clinic with any problems, questions or concerns.   Sullivan Lone MD MS AAHIVMS [facility] Hematology/Oncology Physician [facility]  .*Total Encounter Time as defined by the Centers for Medicare and Medicaid Services includes, in addition to the face-to-face time of a patient visit (documented in the note above) non-face-to-face time: obtaining and reviewing outside history, ordering and reviewing medications, tests or procedures, care coordination (communications with other health care professionals or caregivers) and documentation in the medical record.  I, Melene Muller, am acting as scribe for Dr. Sullivan Lone, MD.  .I have reviewed the above documentation for accuracy and completeness, and I agree with the above. [name] MD

## 2021-11-27 NOTE — Progress Notes (Signed)
Patient and mother presented documentation at registration for one-time $1000 [employer]. They were given a copy of the approval letter and expense sheet along with the Outpatient pharmacy information. They received a gift card today from the grant. They have my card and were instructed to call me to go over grant expenses in detail at [phone].

## 2021-11-28 NOTE — Telephone Encounter (Signed)
Physician signed form on 11/26/2021 was successfully returned via fax by collaborative nurse.   This nurse faxed request for medical records to ([place]) H.I.M. on 11/27/2021.   Message left for Michaeljoseph Revolorio. [phone] (home) to sign a new ROI download from [email or web address], patient family resources, medical records to return to [facility].  Current [place] ROI request signed 07/23/2021 expired on 10/22/2021. 11/28/2021 this nurse checked H.I.M. release output which reads "[facility]- HIM - ROI - Invalid authorization notice, Forms are ineligible, if addressed to physicians; please resubmit a valid authorization form directly to physician office." Original mailed to address on file.  [address] [id number] No further instructions received, actions required or performed by this nurse. Copy to bin for items to be scanned completes process.

## 2021-11-29 ENCOUNTER — Other Ambulatory Visit: Payer: Self-pay

## 2021-11-30 ENCOUNTER — Other Ambulatory Visit: Payer: Self-pay

## 2021-11-30 DIAGNOSIS — C7A1 Malignant poorly differentiated neuroendocrine tumors: Secondary | ICD-10-CM

## 2021-11-30 MED FILL — Dexamethasone Sodium Phosphate Inj 100 MG/10ML: INTRAMUSCULAR | Qty: 1 | Status: AC

## 2021-11-30 MED FILL — Fosaprepitant Dimeglumine For IV Infusion 150 MG (Base Eq): INTRAVENOUS | Qty: 5 | Status: AC

## 2021-12-03 ENCOUNTER — Inpatient Hospital Stay: Payer: 59

## 2021-12-03 ENCOUNTER — Other Ambulatory Visit: Payer: Self-pay

## 2021-12-03 ENCOUNTER — Encounter: Payer: Self-pay | Admitting: Hematology

## 2021-12-03 ENCOUNTER — Telehealth: Payer: Self-pay | Admitting: *Deleted

## 2021-12-03 VITALS — BP 131/94 | HR 81 | Temp 98.6°F | Resp 18 | Wt 320.8 lb

## 2021-12-03 DIAGNOSIS — Z5111 Encounter for antineoplastic chemotherapy: Secondary | ICD-10-CM | POA: Diagnosis not present

## 2021-12-03 DIAGNOSIS — Z7189 Other specified counseling: Secondary | ICD-10-CM

## 2021-12-03 DIAGNOSIS — C7A1 Malignant poorly differentiated neuroendocrine tumors: Secondary | ICD-10-CM

## 2021-12-03 DIAGNOSIS — Z95828 Presence of other vascular implants and grafts: Secondary | ICD-10-CM

## 2021-12-03 DIAGNOSIS — R7989 Other specified abnormal findings of blood chemistry: Secondary | ICD-10-CM

## 2021-12-03 LAB — CBC WITH DIFFERENTIAL (CANCER CENTER ONLY)
Abs Immature Granulocytes: 0.01 10*3/uL (ref 0.00–0.07)
Basophils Absolute: 0 10*3/uL (ref 0.0–0.1)
Basophils Relative: 0 %
Eosinophils Absolute: 0.5 10*3/uL (ref 0.0–0.5)
Eosinophils Relative: 8 %
HCT: 36.8 % — ABNORMAL LOW (ref 39.0–52.0)
Hemoglobin: 12.2 g/dL — ABNORMAL LOW (ref 13.0–17.0)
Immature Granulocytes: 0 %
Lymphocytes Relative: 21 %
Lymphs Abs: 1.3 10*3/uL (ref 0.7–4.0)
MCH: 30.7 pg (ref 26.0–34.0)
MCHC: 33.2 g/dL (ref 30.0–36.0)
MCV: 92.5 fL (ref 80.0–100.0)
Monocytes Absolute: 0.4 10*3/uL (ref 0.1–1.0)
Monocytes Relative: 7 %
Neutro Abs: 3.8 10*3/uL (ref 1.7–7.7)
Neutrophils Relative %: 64 %
Platelet Count: 293 10*3/uL (ref 150–400)
RBC: 3.98 MIL/uL — ABNORMAL LOW (ref 4.22–5.81)
RDW: 12.9 % (ref 11.5–15.5)
WBC Count: 6 10*3/uL (ref 4.0–10.5)
nRBC: 0 % (ref 0.0–0.2)

## 2021-12-03 LAB — CMP (CANCER CENTER ONLY)
ALT: 21 U/L (ref 0–44)
AST: 14 U/L — ABNORMAL LOW (ref 15–41)
Albumin: 4.1 g/dL (ref 3.5–5.0)
Alkaline Phosphatase: 79 U/L (ref 38–126)
Anion gap: 4 — ABNORMAL LOW (ref 5–15)
BUN: 13 mg/dL (ref 6–20)
CO2: 29 mmol/L (ref 22–32)
Calcium: 9.2 mg/dL (ref 8.9–10.3)
Chloride: 106 mmol/L (ref 98–111)
Creatinine: 0.93 mg/dL (ref 0.61–1.24)
GFR, Estimated: >60 mL/min (ref >60)
Glucose, Bld: 100 mg/dL — ABNORMAL HIGH (ref 70–99)
Potassium: 3.9 mmol/L (ref 3.5–5.1)
Sodium: 139 mmol/L (ref 135–145)
Total Bilirubin: 0.3 mg/dL (ref 0.3–1.2)
Total Protein: 7.7 g/dL (ref 6.5–8.1)

## 2021-12-03 MED ORDER — SODIUM CHLORIDE 0.9 % IV SOLN
80.0000 mg/m2 | Freq: Once | INTRAVENOUS | Status: AC
  Administered 2021-12-03: 220 mg via INTRAVENOUS
  Filled 2021-12-03: qty 11

## 2021-12-03 MED ORDER — SODIUM CHLORIDE 0.9 % IV SOLN
750.0000 mg | Freq: Once | INTRAVENOUS | Status: AC
  Administered 2021-12-03: 750 mg via INTRAVENOUS
  Filled 2021-12-03: qty 75

## 2021-12-03 MED ORDER — SODIUM CHLORIDE 0.9 % IV SOLN: Freq: Once | INTRAVENOUS | Status: AC

## 2021-12-03 MED ORDER — HEPARIN SOD (PORK) LOCK FLUSH 100 UNIT/ML IV SOLN: 500.0000 [IU] | Freq: Once | INTRAVENOUS | Status: DC | PRN

## 2021-12-03 MED ORDER — SODIUM CHLORIDE 0.9 % IV SOLN
150.0000 mg | Freq: Once | INTRAVENOUS | Status: AC
  Administered 2021-12-03: 150 mg via INTRAVENOUS
  Filled 2021-12-03: qty 150

## 2021-12-03 MED ORDER — FAMOTIDINE IN NACL 20-0.9 MG/50ML-% IV SOLN
20.0000 mg | Freq: Once | INTRAVENOUS | Status: AC
  Administered 2021-12-03: 20 mg via INTRAVENOUS
  Filled 2021-12-03: qty 50

## 2021-12-03 MED ORDER — PALONOSETRON HCL INJECTION 0.25 MG/5ML
0.2500 mg | Freq: Once | INTRAVENOUS | Status: AC
  Administered 2021-12-03: 0.25 mg via INTRAVENOUS
  Filled 2021-12-03: qty 5

## 2021-12-03 MED ORDER — ACETAMINOPHEN 500 MG PO TABS
1000.0000 mg | ORAL_TABLET | Freq: Once | ORAL | Status: AC
  Administered 2021-12-03: 1000 mg via ORAL
  Filled 2021-12-03: qty 2

## 2021-12-03 MED ORDER — SODIUM CHLORIDE 0.9 % IV SOLN
10.0000 mg | Freq: Once | INTRAVENOUS | Status: AC
  Administered 2021-12-03: 10 mg via INTRAVENOUS
  Filled 2021-12-03: qty 10

## 2021-12-03 MED ORDER — SODIUM CHLORIDE 0.9% FLUSH: 10.0000 mL | INTRAVENOUS | Status: DC | PRN

## 2021-12-03 MED ORDER — SODIUM CHLORIDE 0.9% FLUSH
10.0000 mL | Freq: Once | INTRAVENOUS | Status: AC
  Administered 2021-12-03: 10 mL

## 2021-12-03 MED FILL — Dexamethasone Sodium Phosphate Inj 100 MG/10ML: INTRAMUSCULAR | Qty: 1 | Status: AC

## 2021-12-03 NOTE — Patient Instructions (Signed)
[facility]   Discharge Instructions: Thank you for choosing [facility] to provide your oncology and hematology care.   If you have a lab appointment with the [facility], please go directly to the [facility] and check in at the registration area.   Wear comfortable clothing and clothing appropriate for easy access to any Portacath or PICC line.   We strive to give you quality time with your provider. You may need to reschedule your appointment if you arrive late (15 or more minutes).  Arriving late affects you and other patients whose appointments are after yours.  Also, if you miss three or more appointments without notifying the office, you may be dismissed from the clinic at the provider's discretion.      For prescription refill requests, have your pharmacy contact our office and allow 72 hours for refills to be completed.    Today you received the following chemotherapy and/or immunotherapy agents: Etoposide and Carboplatin       To help prevent nausea and vomiting after your treatment, we encourage you to take your nausea medication as directed.  BELOW ARE SYMPTOMS THAT SHOULD BE REPORTED IMMEDIATELY: *FEVER GREATER THAN 100.4 F (38 C) OR HIGHER *CHILLS OR SWEATING *NAUSEA AND VOMITING THAT IS NOT CONTROLLED WITH YOUR NAUSEA MEDICATION *UNUSUAL SHORTNESS OF BREATH *UNUSUAL BRUISING OR BLEEDING *URINARY PROBLEMS (pain or burning when urinating, or frequent urination) *BOWEL PROBLEMS (unusual diarrhea, constipation, pain near the anus) TENDERNESS IN MOUTH AND THROAT WITH OR WITHOUT PRESENCE OF ULCERS (sore throat, sores in mouth, or a toothache) UNUSUAL RASH, SWELLING OR PAIN  UNUSUAL VAGINAL DISCHARGE OR ITCHING   Items with * indicate a potential emergency and should be followed up as soon as possible or go to the Emergency Department if any problems should occur.  Please show the CHEMOTHERAPY ALERT CARD or IMMUNOTHERAPY ALERT  CARD at check-in to the Emergency Department and triage nurse.  Should you have questions after your visit or need to cancel or reschedule your appointment, please contact [facility]  Dept: [phone]  and follow the prompts.  Office hours are 8:00 a.m. to 4:30 p.m. Monday - Friday. Please note that voicemails left after 4:00 p.m. may not be returned until the following business day.  We are closed weekends and major holidays. You have access to a nurse at all times for urgent questions. Please call the main number to the clinic Dept: [phone] and follow the prompts.   For any non-urgent questions, you may also contact your provider using MyChart. We now offer e-Visits for anyone 62 and older to request care online for non-urgent symptoms. For details visit [email or web address].   Also download the MyChart app! Go to the app store, search "MyChart", open the app, select , and log in with your MyChart username and password.  Masks are optional in the cancer centers. If you would like for your care team to wear a mask while they are taking care of you, please let them know. You may have one support person who is at least 31 years old accompany you for your appointments.

## 2021-12-03 NOTE — Telephone Encounter (Signed)
Voicemail: "Leon Marhefka Jr.'s mother [name], [phone]).  Spoke with [employer].  They need records for the month of October.  If not received, they will deny his case.  Their fax number is [phone]."

## 2021-12-03 NOTE — Telephone Encounter (Signed)
Writer returned call to Leon Fischer and provided update to [employer]. in treatment area section I of the following.   [place] (SW) H.I.M. processes record request.   This nurse faxed request to H.I.M. office with authorizations for release upon today's receipt of correctly addressed request. Continued to receive request addressed to provider despite this nurse faxing copy of the 11/28/2021 H.I.M. letter on 11/30/2021.   This nurse called [employer] [facility] earlier today to advise verbally that (SW) H.I.M. unable to comply, record request is ineligible addressed to provider.  This nurse will fax the correctly addressed request with our legal name "[place]" to [place] [employer] office.  (SW) H.I.M. office has thirty days to process record request.  Representative reports [phone] as the fax number for medical records.  This nurse provided main office toll free fax number [phone].  No further questions or needs." [name] "thanked this nurse for handling this.  Last Thursday I was told request was corrected.  His claim was to be denied today."  No further questions or needs.

## 2021-12-04 ENCOUNTER — Inpatient Hospital Stay: Payer: 59

## 2021-12-04 VITALS — BP 125/76 | HR 87 | Temp 98.2°F | Resp 16 | Wt 325.0 lb

## 2021-12-04 DIAGNOSIS — C7A1 Malignant poorly differentiated neuroendocrine tumors: Secondary | ICD-10-CM

## 2021-12-04 DIAGNOSIS — Z7189 Other specified counseling: Secondary | ICD-10-CM

## 2021-12-04 DIAGNOSIS — Z5111 Encounter for antineoplastic chemotherapy: Secondary | ICD-10-CM | POA: Diagnosis not present

## 2021-12-04 MED ORDER — HEPARIN SOD (PORK) LOCK FLUSH 100 UNIT/ML IV SOLN
500.0000 [IU] | Freq: Once | INTRAVENOUS | Status: AC | PRN
  Administered 2021-12-04: 500 [IU]

## 2021-12-04 MED ORDER — SODIUM CHLORIDE 0.9% FLUSH
10.0000 mL | INTRAVENOUS | Status: DC | PRN
  Administered 2021-12-04: 10 mL

## 2021-12-04 MED ORDER — SODIUM CHLORIDE 0.9 % IV SOLN: Freq: Once | INTRAVENOUS | Status: AC

## 2021-12-04 MED ORDER — SODIUM CHLORIDE 0.9 % IV SOLN
80.0000 mg/m2 | Freq: Once | INTRAVENOUS | Status: AC
  Administered 2021-12-04: 220 mg via INTRAVENOUS
  Filled 2021-12-04: qty 11

## 2021-12-04 MED ORDER — SODIUM CHLORIDE 0.9 % IV SOLN
10.0000 mg | Freq: Once | INTRAVENOUS | Status: AC
  Administered 2021-12-04: 10 mg via INTRAVENOUS
  Filled 2021-12-04: qty 10
  Filled 2021-12-04: qty 1

## 2021-12-04 MED FILL — Dexamethasone Sodium Phosphate Inj 100 MG/10ML: INTRAMUSCULAR | Qty: 1 | Status: AC

## 2021-12-04 NOTE — Patient Instructions (Signed)
[facility]  Discharge Instructions: Thank you for choosing [facility] to provide your oncology and hematology care.   If you have a lab appointment with the Cancer Center, please go directly to the Cancer Center and check in at the registration area.   Wear comfortable clothing and clothing appropriate for easy access to any Portacath or PICC line.   We strive to give you quality time with your provider. You may need to reschedule your appointment if you arrive late (15 or more minutes).  Arriving late affects you and other patients whose appointments are after yours.  Also, if you miss three or more appointments without notifying the office, you may be dismissed from the clinic at the provider's discretion.      For prescription refill requests, have your pharmacy contact our office and allow 72 hours for refills to be completed.    Today you received the following chemotherapy and/or immunotherapy agents: Etoposide      To help prevent nausea and vomiting after your treatment, we encourage you to take your nausea medication as directed.  BELOW ARE SYMPTOMS THAT SHOULD BE REPORTED IMMEDIATELY: *FEVER GREATER THAN 100.4 F (38 C) OR HIGHER *CHILLS OR SWEATING *NAUSEA AND VOMITING THAT IS NOT CONTROLLED WITH YOUR NAUSEA MEDICATION *UNUSUAL SHORTNESS OF BREATH *UNUSUAL BRUISING OR BLEEDING *URINARY PROBLEMS (pain or burning when urinating, or frequent urination) *BOWEL PROBLEMS (unusual diarrhea, constipation, pain near the anus) TENDERNESS IN MOUTH AND THROAT WITH OR WITHOUT PRESENCE OF ULCERS (sore throat, sores in mouth, or a toothache) UNUSUAL RASH, SWELLING OR PAIN  UNUSUAL VAGINAL DISCHARGE OR ITCHING   Items with * indicate a potential emergency and should be followed up as soon as possible or go to the Emergency Department if any problems should occur.  Please show the CHEMOTHERAPY ALERT CARD or IMMUNOTHERAPY ALERT CARD at check-in to  the Emergency Department and triage nurse.  Should you have questions after your visit or need to cancel or reschedule your appointment, please contact [facility]  Dept: [phone]  and follow the prompts.  Office hours are 8:00 a.m. to 4:30 p.m. Monday - Friday. Please note that voicemails left after 4:00 p.m. may not be returned until the following business day.  We are closed weekends and major holidays. You have access to a nurse at all times for urgent questions. Please call the main number to the clinic Dept: [phone] and follow the prompts.   For any non-urgent questions, you may also contact your provider using MyChart. We now offer e-Visits for anyone 18 and older to request care online for non-urgent symptoms. For details visit [email or web address].   Also download the MyChart app! Go to the app store, search "MyChart", open the app, select [facility], and log in with your MyChart username and password.  Masks are optional in the cancer centers. If you would like for your care team to wear a mask while they are taking care of you, please let them know. You may have one support person who is at least 31 years old accompany you for your appointments. 

## 2021-12-05 ENCOUNTER — Inpatient Hospital Stay: Payer: 59 | Attending: Hematology

## 2021-12-05 VITALS — BP 129/89 | HR 70 | Temp 97.9°F | Resp 16

## 2021-12-05 DIAGNOSIS — Z5189 Encounter for other specified aftercare: Secondary | ICD-10-CM | POA: Insufficient documentation

## 2021-12-05 DIAGNOSIS — C7A8 Other malignant neuroendocrine tumors: Secondary | ICD-10-CM | POA: Insufficient documentation

## 2021-12-05 DIAGNOSIS — C7B8 Other secondary neuroendocrine tumors: Secondary | ICD-10-CM | POA: Diagnosis not present

## 2021-12-05 DIAGNOSIS — Z5111 Encounter for antineoplastic chemotherapy: Secondary | ICD-10-CM | POA: Diagnosis not present

## 2021-12-05 DIAGNOSIS — Z7189 Other specified counseling: Secondary | ICD-10-CM

## 2021-12-05 DIAGNOSIS — C7A1 Malignant poorly differentiated neuroendocrine tumors: Secondary | ICD-10-CM

## 2021-12-05 MED ORDER — HEPARIN SOD (PORK) LOCK FLUSH 100 UNIT/ML IV SOLN
500.0000 [IU] | Freq: Once | INTRAVENOUS | Status: AC | PRN
  Administered 2021-12-05: 500 [IU]

## 2021-12-05 MED ORDER — SODIUM CHLORIDE 0.9 % IV SOLN
80.0000 mg/m2 | Freq: Once | INTRAVENOUS | Status: AC
  Administered 2021-12-05: 220 mg via INTRAVENOUS
  Filled 2021-12-05: qty 11

## 2021-12-05 MED ORDER — SODIUM CHLORIDE 0.9% FLUSH
10.0000 mL | INTRAVENOUS | Status: DC | PRN
  Administered 2021-12-05: 10 mL

## 2021-12-05 MED ORDER — SODIUM CHLORIDE 0.9 % IV SOLN: Freq: Once | INTRAVENOUS | Status: AC

## 2021-12-05 MED ORDER — SODIUM CHLORIDE 0.9 % IV SOLN
10.0000 mg | Freq: Once | INTRAVENOUS | Status: AC
  Administered 2021-12-05: 10 mg via INTRAVENOUS
  Filled 2021-12-05: qty 10

## 2021-12-05 MED ORDER — PEGFILGRASTIM 6 MG/0.6ML ~~LOC~~ PSKT
6.0000 mg | PREFILLED_SYRINGE | Freq: Once | SUBCUTANEOUS | Status: AC
  Administered 2021-12-05: 6 mg via SUBCUTANEOUS
  Filled 2021-12-05: qty 0.6

## 2021-12-05 NOTE — Patient Instructions (Signed)
[facility]  Discharge Instructions: Thank you for choosing [facility] to provide your oncology and hematology care.   If you have a lab appointment with the Cancer Center, please go directly to the Cancer Center and check in at the registration area.   Wear comfortable clothing and clothing appropriate for easy access to any Portacath or PICC line.   We strive to give you quality time with your provider. You may need to reschedule your appointment if you arrive late (15 or more minutes).  Arriving late affects you and other patients whose appointments are after yours.  Also, if you miss three or more appointments without notifying the office, you may be dismissed from the clinic at the provider's discretion.      For prescription refill requests, have your pharmacy contact our office and allow 72 hours for refills to be completed.    Today you received the following chemotherapy and/or immunotherapy agents: Etoposide      To help prevent nausea and vomiting after your treatment, we encourage you to take your nausea medication as directed.  BELOW ARE SYMPTOMS THAT SHOULD BE REPORTED IMMEDIATELY: *FEVER GREATER THAN 100.4 F (38 C) OR HIGHER *CHILLS OR SWEATING *NAUSEA AND VOMITING THAT IS NOT CONTROLLED WITH YOUR NAUSEA MEDICATION *UNUSUAL SHORTNESS OF BREATH *UNUSUAL BRUISING OR BLEEDING *URINARY PROBLEMS (pain or burning when urinating, or frequent urination) *BOWEL PROBLEMS (unusual diarrhea, constipation, pain near the anus) TENDERNESS IN MOUTH AND THROAT WITH OR WITHOUT PRESENCE OF ULCERS (sore throat, sores in mouth, or a toothache) UNUSUAL RASH, SWELLING OR PAIN  UNUSUAL VAGINAL DISCHARGE OR ITCHING   Items with * indicate a potential emergency and should be followed up as soon as possible or go to the Emergency Department if any problems should occur.  Please show the CHEMOTHERAPY ALERT CARD or IMMUNOTHERAPY ALERT CARD at check-in to  the Emergency Department and triage nurse.  Should you have questions after your visit or need to cancel or reschedule your appointment, please contact [facility]  Dept: [phone]  and follow the prompts.  Office hours are 8:00 a.m. to 4:30 p.m. Monday - Friday. Please note that voicemails left after 4:00 p.m. may not be returned until the following business day.  We are closed weekends and major holidays. You have access to a nurse at all times for urgent questions. Please call the main number to the clinic Dept: [phone] and follow the prompts.   For any non-urgent questions, you may also contact your provider using MyChart. We now offer e-Visits for anyone 18 and older to request care online for non-urgent symptoms. For details visit [email or web address].   Also download the MyChart app! Go to the app store, search "MyChart", open the app, select , and log in with your MyChart username and password.  Masks are optional in the cancer centers. If you would like for your care team to wear a mask while they are taking care of you, please let them know. You may have one support person who is at least 31 years old accompany you for your appointments. 

## 2021-12-14 ENCOUNTER — Inpatient Hospital Stay: Payer: 59

## 2021-12-14 NOTE — Progress Notes (Signed)
[facility] CSW Progress Note  Clinical Education officer, museum contacted caregiver by phone to assess needs.  CSW spoke with patient's mother, Leon Fischer.  She stated patient was doing fine.  She talked about patient's niece and nephew and how important they are to him.  Provided active listening.    Leon Pickle Lyrika Souders, LCSW

## 2021-12-21 ENCOUNTER — Other Ambulatory Visit: Payer: Self-pay

## 2021-12-21 DIAGNOSIS — C7A1 Malignant poorly differentiated neuroendocrine tumors: Secondary | ICD-10-CM

## 2021-12-21 MED FILL — Fosaprepitant Dimeglumine For IV Infusion 150 MG (Base Eq): INTRAVENOUS | Qty: 5 | Status: AC

## 2021-12-21 MED FILL — Dexamethasone Sodium Phosphate Inj 100 MG/10ML: INTRAMUSCULAR | Qty: 1 | Status: AC

## 2021-12-24 ENCOUNTER — Inpatient Hospital Stay: Payer: 59

## 2021-12-24 ENCOUNTER — Other Ambulatory Visit: Payer: Self-pay

## 2021-12-24 ENCOUNTER — Inpatient Hospital Stay (HOSPITAL_BASED_OUTPATIENT_CLINIC_OR_DEPARTMENT_OTHER): Payer: 59 | Admitting: Hematology

## 2021-12-24 VITALS — BP 126/84 | HR 99 | Temp 98.4°F | Wt 304.3 lb

## 2021-12-24 DIAGNOSIS — Z7189 Other specified counseling: Secondary | ICD-10-CM

## 2021-12-24 DIAGNOSIS — Z5111 Encounter for antineoplastic chemotherapy: Secondary | ICD-10-CM

## 2021-12-24 DIAGNOSIS — Z95828 Presence of other vascular implants and grafts: Secondary | ICD-10-CM

## 2021-12-24 DIAGNOSIS — C7A1 Malignant poorly differentiated neuroendocrine tumors: Secondary | ICD-10-CM

## 2021-12-24 LAB — CBC WITH DIFFERENTIAL (CANCER CENTER ONLY)
Abs Immature Granulocytes: 0.12 10*3/uL — ABNORMAL HIGH (ref 0.00–0.07)
Basophils Absolute: 0 10*3/uL (ref 0.0–0.1)
Basophils Relative: 0 %
Eosinophils Absolute: 0 10*3/uL (ref 0.0–0.5)
Eosinophils Relative: 0 %
HCT: 35.7 % — ABNORMAL LOW (ref 39.0–52.0)
Hemoglobin: 12.3 g/dL — ABNORMAL LOW (ref 13.0–17.0)
Immature Granulocytes: 1 %
Lymphocytes Relative: 15 %
Lymphs Abs: 1.5 10*3/uL (ref 0.7–4.0)
MCH: 30.6 pg (ref 26.0–34.0)
MCHC: 34.5 g/dL (ref 30.0–36.0)
MCV: 88.8 fL (ref 80.0–100.0)
Monocytes Absolute: 1.2 10*3/uL — ABNORMAL HIGH (ref 0.1–1.0)
Monocytes Relative: 11 %
Neutro Abs: 7.5 10*3/uL (ref 1.7–7.7)
Neutrophils Relative %: 73 %
Platelet Count: 447 10*3/uL — ABNORMAL HIGH (ref 150–400)
RBC: 4.02 MIL/uL — ABNORMAL LOW (ref 4.22–5.81)
RDW: 12.7 % (ref 11.5–15.5)
WBC Count: 10.3 10*3/uL (ref 4.0–10.5)
nRBC: 0 % (ref 0.0–0.2)

## 2021-12-24 LAB — CMP (CANCER CENTER ONLY)
ALT: 26 U/L (ref 0–44)
AST: 18 U/L (ref 15–41)
Albumin: 4.2 g/dL (ref 3.5–5.0)
Alkaline Phosphatase: 82 U/L (ref 38–126)
Anion gap: 7 (ref 5–15)
BUN: 6 mg/dL (ref 6–20)
CO2: 31 mmol/L (ref 22–32)
Calcium: 10 mg/dL (ref 8.9–10.3)
Chloride: 98 mmol/L (ref 98–111)
Creatinine: 0.82 mg/dL (ref 0.61–1.24)
GFR, Estimated: >60 mL/min (ref >60)
Glucose, Bld: 107 mg/dL — ABNORMAL HIGH (ref 70–99)
Potassium: 3.6 mmol/L (ref 3.5–5.1)
Sodium: 136 mmol/L (ref 135–145)
Total Bilirubin: 0.3 mg/dL (ref 0.3–1.2)
Total Protein: 8.9 g/dL — ABNORMAL HIGH (ref 6.5–8.1)

## 2021-12-24 MED ORDER — SODIUM CHLORIDE 0.9 % IV SOLN
10.0000 mg | Freq: Once | INTRAVENOUS | Status: AC
  Administered 2021-12-24: 10 mg via INTRAVENOUS
  Filled 2021-12-24: qty 10

## 2021-12-24 MED ORDER — FAMOTIDINE IN NACL 20-0.9 MG/50ML-% IV SOLN
20.0000 mg | Freq: Once | INTRAVENOUS | Status: AC
  Administered 2021-12-24: 20 mg via INTRAVENOUS
  Filled 2021-12-24: qty 50

## 2021-12-24 MED ORDER — SODIUM CHLORIDE 0.9 % IV SOLN: Freq: Once | INTRAVENOUS | Status: AC

## 2021-12-24 MED ORDER — SODIUM CHLORIDE 0.9 % IV SOLN
750.0000 mg | Freq: Once | INTRAVENOUS | Status: AC
  Administered 2021-12-24: 750 mg via INTRAVENOUS
  Filled 2021-12-24: qty 75

## 2021-12-24 MED ORDER — SODIUM CHLORIDE 0.9 % IV SOLN
150.0000 mg | Freq: Once | INTRAVENOUS | Status: AC
  Administered 2021-12-24: 150 mg via INTRAVENOUS
  Filled 2021-12-24: qty 150

## 2021-12-24 MED ORDER — PALONOSETRON HCL INJECTION 0.25 MG/5ML
0.2500 mg | Freq: Once | INTRAVENOUS | Status: AC
  Administered 2021-12-24: 0.25 mg via INTRAVENOUS
  Filled 2021-12-24: qty 5

## 2021-12-24 MED ORDER — ACETAMINOPHEN 500 MG PO TABS
1000.0000 mg | ORAL_TABLET | Freq: Once | ORAL | Status: AC
  Administered 2021-12-24: 1000 mg via ORAL
  Filled 2021-12-24: qty 2

## 2021-12-24 MED ORDER — SODIUM CHLORIDE 0.9% FLUSH
10.0000 mL | Freq: Once | INTRAVENOUS | Status: AC
  Administered 2021-12-24: 10 mL

## 2021-12-24 MED ORDER — SODIUM CHLORIDE 0.9 % IV SOLN
80.0000 mg/m2 | Freq: Once | INTRAVENOUS | Status: AC
  Administered 2021-12-24: 220 mg via INTRAVENOUS
  Filled 2021-12-24: qty 11

## 2021-12-24 MED FILL — Dexamethasone Sodium Phosphate Inj 100 MG/10ML: INTRAMUSCULAR | Qty: 1 | Status: AC

## 2021-12-24 NOTE — Progress Notes (Signed)
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  HISTORY OF PRESENTING ILLNESS:   Leon Fischer. is a wonderful 31 y.o. 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 He presents today accompanied by his mother. He reports He is doing well.  He reports no color change in stool.  He notes that he is eating well but feels food just sits in stomach.  We discussed getting additional scans and labs for further evaluation and treatment which he was agreeable to. We further discussed getting MRI of brain and PET scan which he was agreeable to.  He notes that he is trying to hold it together mentally and is still trying to process his new diagnosis. We discussed the options of psychological or spiritual counseling which he has not decided on at this time. He was advised that may call back if he decides that he would like to pursue either.  We discussed getting a port a cath installed which he was agreeable to.  He recently had a biliary stent put into common bile duct.   No abdominal pain or change in bowel habits. No new or unexpected weight loss. No other new or acute focal symptoms.  We discussed his recent stomach biopsy done 07/13/2021 which sowed gastropathy and minor chronic gastritis with lymphoid aggregate. Negative for H. Pylori.  We discussed recent cytology done 07/13/2021 which showed presence of malignant cells in pancreas and poorly differentiated/high grade neuroendocrine carcinoma.  We discussed upper endoscopy done 07/13/2021 revealed mass in  pancreatic head and some enlarged lymph nodes were seen in the celiac and peripancreatic regions.  Labs done today were reviewed in detail.  INTERVAL HISTORY:  Leon Fischer. Is a 31 y.o. 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 He is here to start cycle 6 of carboplatin, and Etoposide treatment.  Patient was last seen by me on 11/27/2021 and was doing well without any new medical concerns. He was tolerating the carboplatin etoposide chemotherapy well with no prohibitive toxicities.  Patient reports he has been doing well without any new medical concerns since our last visit. He denies any toxicities with his cycle 5 of his treatment.   He notes he has not been contacted by [facility] referral.   He denies loss of appetite, fever, chills, back pain, abdominal pain, abnormal bowl moments, tingling sensation, and leg swelling.    MEDICAL HISTORY:  Past Medical History:  Diagnosis Date   Asthma    Neuroendocrine cancer ([place])     SURGICAL HISTORY: Past Surgical History:  Procedure Laterality Date   BILIARY DILATION  07/13/2021   Procedure: BILIARY DILATION;  Surgeon: Clarene Essex, MD;  Location: [facility];  Service: Gastroenterology;;   BILIARY STENT PLACEMENT N/A 07/13/2021   Procedure: BILIARY STENT PLACEMENT;  Surgeon: Clarene Essex, MD;  Location: [name];  Service: Gastroenterology;  Laterality: N/A;   BILIARY STENT PLACEMENT N/A 11/05/2021   Procedure: BILIARY STENT PLACEMENT;  Surgeon: Clarene Essex, MD;  Location: [name];  Service: Gastroenterology;  Laterality: N/A;   BIOPSY  07/13/2021   Procedure: BIOPSY;  Surgeon: Rush Landmark Telford Nab., MD;  Location: [facility];  Service: Gastroenterology;;   CHOLECYSTECTOMY N/A 11/07/2021   Procedure: LAPAROSCOPIC CHOLECYSTECTOMY;  Surgeon: Clovis Riley, MD;  Location: [facility];  Service: General;  Laterality: N/A;   ENDOSCOPIC RETROGRADE  CHOLANGIOPANCREATOGRAPHY (ERCP) WITH PROPOFOL N/A 07/13/2021   Procedure: ENDOSCOPIC RETROGRADE CHOLANGIOPANCREATOGRAPHY (ERCP) WITH PROPOFOL;  Surgeon: Clarene Essex, MD;  Location: [name];  Service: Gastroenterology;  Laterality: N/A;   ERCP N/A 11/05/2021   Procedure: ENDOSCOPIC RETROGRADE CHOLANGIOPANCREATOGRAPHY (ERCP);  Surgeon: Clarene Essex, MD;  Location: [facility];  Service: Gastroenterology;  Laterality: N/A;   ESOPHAGOGASTRODUODENOSCOPY (EGD) WITH PROPOFOL N/A 07/13/2021   Procedure: ESOPHAGOGASTRODUODENOSCOPY (EGD) WITH PROPOFOL;  Surgeon: Rush Landmark Telford Nab., MD;  Location: [name];  Service: Gastroenterology;  Laterality: N/A;   EUS N/A 07/13/2021   Procedure: UPPER ENDOSCOPIC ULTRASOUND (EUS) LINEAR;  Surgeon: Irving Copas., MD;  Location: [name];  Service: Gastroenterology;  Laterality: N/A;   FINE NEEDLE ASPIRATION  07/13/2021   Procedure: FINE NEEDLE ASPIRATION (FNA) LINEAR;  Surgeon: Irving Copas., MD;  Location: [facility];  Service: Gastroenterology;;   IR IMAGING GUIDED PORT INSERTION  07/31/2021   SPHINCTEROTOMY  07/13/2021   Procedure: Joan Mayans;  Surgeon: Clarene Essex, MD;  Location: [name];  Service: Gastroenterology;;    SOCIAL HISTORY: Social History   Socioeconomic History   Marital status: Single    Spouse name: Not on file   Number of children: 0   Years of education: Not on file   Highest education level: Some college, no degree  Occupational History   Not on file  Tobacco Use   Smoking status: Some Days    Types: Cigarettes   Smokeless tobacco: Never  Vaping Use   Vaping Use: Never used  Substance and Sexual Activity   Alcohol use: Yes    Comment: social   Drug use: No   Sexual activity: Never  Other Topics Concern   Not on file  Social History Narrative   Not on file   Social Determinants of Health   Financial Resource Strain: Low Risk  (04/14/2017)   Overall Financial Resource Strain (CARDIA)     Difficulty of Paying Living Expenses: Not hard at all  Food Insecurity: No Food Insecurity (11/07/2021)   Hunger Vital Sign    Worried About Running Out of Food in the Last Year: Never true    Ran Out of Food in the Last Year: Never true  Transportation Needs: No Transportation Needs (11/07/2021)   PRAPARE - Hydrologist (Medical): No    Lack of Transportation (Non-Medical): No  Physical Activity: Inactive (04/14/2017)   Exercise Vital Sign    Days of Exercise per Week: 0 days    Minutes of Exercise per Session: 0 min  Stress: Stress Concern Present (04/14/2017)   [place]    Feeling of Stress : Rather much  Social Connections: Moderately Isolated (04/14/2017)   Social Connection and Isolation Panel [NHANES]    Frequency of Communication with Friends and Family: More than three times a week    Frequency of Social Gatherings with Friends and Family: More than three times a week    Attends Religious Services: Never    Marine scientist or Organizations: No    Attends Archivist Meetings: Never    Marital Status: Never married  Intimate Partner Violence: Not At Risk (11/07/2021)   Humiliation, Afraid, Rape, and Kick questionnaire    Fear of Current or Ex-Partner: No  Emotionally Abused: No    Physically Abused: No    Sexually Abused: No    FAMILY HISTORY: Family History  Problem Relation Age of Onset   Drug abuse Father    ALLERGIES:  is allergic to shellfish allergy and other.  MEDICATIONS:  Current Outpatient Medications  Medication Sig Dispense Refill   acetaminophen (TYLENOL) 500 MG tablet Take 1,000 mg by mouth every 6 (six) hours as needed for mild pain.     albuterol (VENTOLIN HFA) 108 (90 Base) MCG/ACT inhaler INHALE 2 PUFFS BY MOUTH EVERY 4 HOURS AS NEEDED FOR WHEEZING FOR SHORTNESS OF BREATH 18 g 0   dexamethasone (DECADRON) 4 MG tablet Take 2 tablets (8 mg  total) by mouth daily. Start the day after chemotherapy for 1 day. 30 tablet 1   diclofenac Sodium (VOLTAREN) 1 % GEL Apply 4 g topically 4 (four) times daily. (Patient taking differently: Apply 4 g topically 4 (four) times daily as needed (pain).) 100 g 0   lidocaine-prilocaine (EMLA) cream Apply to affected area once (Patient taking differently: Apply 1 Application topically daily as needed (access port). Apply to affected area once) 30 g 3   ondansetron (ZOFRAN) 8 MG tablet Take 1 tablet (8 mg total) by mouth 2 (two) times daily as needed for refractory nausea / vomiting. Start on day 3 after carboplatin chemo. 30 tablet 1   oxyCODONE (OXY IR/ROXICODONE) 5 MG immediate release tablet Take 1 tablet (5 mg total) by mouth every 6 (six) hours as needed for severe pain or moderate pain. 20 tablet 0   polyethylene glycol (MIRALAX / GLYCOLAX) 17 g packet Take 17 g by mouth daily. (Patient taking differently: Take 17 g by mouth daily as needed for mild constipation or moderate constipation.) 14 each 0   prochlorperazine (COMPAZINE) 10 MG tablet Take 1 tablet (10 mg total) by mouth every 6 (six) hours as needed (Nausea or vomiting). 30 tablet 1   senna-docusate (SENOKOT-S) 8.6-50 MG tablet Take 2 tablets by mouth 2 (two) times daily. (Patient taking differently: Take 2 tablets by mouth daily as needed for mild constipation or moderate constipation.) 30 tablet 0   No current facility-administered medications for this visit.    REVIEW OF SYSTEMS:    10 Point review of Systems was done is negative except as noted above.  PHYSICAL EXAMINATION: ECOG PERFORMANCE STATUS: 1 - Symptomatic but completely ambulatory  . Vitals:   12/24/21 1223  BP: 126/84  Pulse: 99  Temp: 98.4 F (36.9 C)  SpO2: 98%   .Body mass index is 41.27 kg/m. NAD GENERAL:alert, in no acute distress and comfortable SKIN: no acute rashes, no significant lesions EYES: conjunctiva are pink and non-injected, sclera anicteric NECK:  supple, no JVD LYMPH:  no palpable lymphadenopathy in the cervical, axillary or inguinal regions LUNGS: clear to auscultation b/l with normal respiratory effort HEART: regular rate & rhythm ABDOMEN:  normoactive bowel sounds , non tender, not distended. Extremity: no pedal edema PSYCH: alert & oriented x 3 with fluent speech NEURO: no focal motor/sensory deficits  LABORATORY DATA:  I have reviewed the data as listed  .    Latest Ref Rng & Units 12/24/2021   11:59 AM 12/03/2021   12:21 PM 11/27/2021   12:21 PM  CBC  WBC 4.0 - 10.5 K/uL 10.3  6.0  8.4   Hemoglobin 13.0 - 17.0 g/dL 12.3  12.2  12.4   Hematocrit 39.0 - 52.0 % 35.7  36.8  37.1   Platelets 150 -  400 K/uL 447  293  293     .    Latest Ref Rng & Units 12/03/2021   12:21 PM 11/27/2021   12:21 PM 11/15/2021    1:46 AM  CMP  Glucose 70 - 99 mg/dL 100  146  107   BUN 6 - 20 mg/dL '13  9  6   '$ Creatinine 0.61 - 1.24 mg/dL 0.93  0.87  0.72   Sodium 135 - 145 mmol/L 139  139  137   Potassium 3.5 - 5.1 mmol/L 3.9  3.8  3.2   Chloride 98 - 111 mmol/L 106  104  105   CO2 22 - 32 mmol/L '29  28  24   '$ Calcium 8.9 - 10.3 mg/dL 9.2  9.6  8.8   Total Protein 6.5 - 8.1 g/dL 7.7  8.0  7.5   Total Bilirubin 0.3 - 1.2 mg/dL 0.3  0.4  0.6   Alkaline Phos 38 - 126 U/L 79  82  81   AST 15 - 41 U/L '14  16  23   '$ ALT 0 - 44 U/L 21  27  52    Cytology done 07/13/2021 revealed "FINAL MICROSCOPIC DIAGNOSIS:  A. PANCREAS, HEAD, FINE NEEDLE ASPIRATION:  - Malignant cells present  - Poorly differentiated/high-grade neuroendocrine carcinoma (see  comment)   B. CBD STRICTURE, BRUSHING:  - Atypical cells suspicious for tumor   C. BILIARY DILATION, BALLOON, REMOVAL:  - Atypical cells suspicious for tumor "  Surgical pathology done 07/13/2021 revealed "FINAL MICROSCOPIC DIAGNOSIS:   A. STOMACH, BIOPSY:  Reactive gastropathy and minimal chronic gastritis with lymphoid  aggregate  Negative for H. pylori, intestinal metaplasia,  dysplasia and carcinoma"  RADIOGRAPHIC STUDIES: I have personally reviewed the radiological images as listed and agreed with the findings in the report. No results found.   ASSESSMENT & PLAN:   31 y.o. very pleasant male with  1. 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 -Cytology done 07/13/2021 shows presence of malignant cells in pancreas and poorly differentiated/high grade neuroendocrine carcinoma.  Plan -I discussed available labs with the patient in details -Discussed labs results from today. CBC shows Hgb is 12.3 K, WBC count of 10.3 k, and platelet count of 447k. CMP is stable.  -patient with no significant toxicities from C5 of carboplatin/etoposide at this time. -will plan to get PET/CU-64 after 6 cycles of treatment to evaluate for residual disease activity and to determine octreotide receptor positivity to plan for subsequent treatments. -We discussed next line treatment options including just taking a conservative approach and monitoring.  -We also discussed getting an additional opinion from [place] for additional treatment options following C6 of treatment   Follow up: Pet -dotatate in 1-2 weeks RTC with Dr Irene Limbo with labs in 3 weeks Referred to [facility] -- Dr Dennison Nancy for 2nd opinion /clinical trial considerations  The total time spent in the appointment was 30 minutes* .  All of the patient's questions were answered with apparent satisfaction. The patient knows to call the clinic with any problems, questions or concerns.   Sullivan Lone MD MS AAHIVMS [facility] Hematology/Oncology Physician [facility]  .*Total Encounter Time as defined by the Centers for Medicare and Medicaid Services includes, in addition to the face-to-face time of a patient visit (documented in the note above) non-face-to-face time: obtaining and reviewing outside history, ordering and reviewing  medications, tests or procedures, care coordination (communications with  other health care professionals or caregivers) and documentation in the medical record.   I, Cleda Mccreedy, am acting as a Education administrator for Sullivan Lone, MD.  .I have reviewed the above documentation for accuracy and completeness, and I agree with the above. [name] MD

## 2021-12-25 ENCOUNTER — Inpatient Hospital Stay: Payer: 59

## 2021-12-25 VITALS — BP 135/86 | HR 99 | Temp 98.0°F | Resp 17

## 2021-12-25 DIAGNOSIS — C7A1 Malignant poorly differentiated neuroendocrine tumors: Secondary | ICD-10-CM

## 2021-12-25 DIAGNOSIS — Z7189 Other specified counseling: Secondary | ICD-10-CM

## 2021-12-25 DIAGNOSIS — Z5111 Encounter for antineoplastic chemotherapy: Secondary | ICD-10-CM | POA: Diagnosis not present

## 2021-12-25 MED ORDER — SODIUM CHLORIDE 0.9 % IV SOLN: Freq: Once | INTRAVENOUS | Status: AC

## 2021-12-25 MED ORDER — SODIUM CHLORIDE 0.9 % IV SOLN
10.0000 mg | Freq: Once | INTRAVENOUS | Status: AC
  Administered 2021-12-25: 10 mg via INTRAVENOUS
  Filled 2021-12-25: qty 10

## 2021-12-25 MED ORDER — SODIUM CHLORIDE 0.9 % IV SOLN
80.0000 mg/m2 | Freq: Once | INTRAVENOUS | Status: AC
  Administered 2021-12-25: 220 mg via INTRAVENOUS
  Filled 2021-12-25: qty 11

## 2021-12-25 MED FILL — Dexamethasone Sodium Phosphate Inj 100 MG/10ML: INTRAMUSCULAR | Qty: 1 | Status: AC

## 2021-12-26 ENCOUNTER — Other Ambulatory Visit: Payer: Self-pay

## 2021-12-26 ENCOUNTER — Inpatient Hospital Stay: Payer: 59

## 2021-12-26 VITALS — BP 135/82 | HR 88 | Temp 98.8°F | Resp 18

## 2021-12-26 DIAGNOSIS — Z7189 Other specified counseling: Secondary | ICD-10-CM

## 2021-12-26 DIAGNOSIS — C7A1 Malignant poorly differentiated neuroendocrine tumors: Secondary | ICD-10-CM

## 2021-12-26 DIAGNOSIS — Z5111 Encounter for antineoplastic chemotherapy: Secondary | ICD-10-CM | POA: Diagnosis not present

## 2021-12-26 MED ORDER — SODIUM CHLORIDE 0.9 % IV SOLN: Freq: Once | INTRAVENOUS | Status: AC

## 2021-12-26 MED ORDER — SODIUM CHLORIDE 0.9 % IV SOLN
80.0000 mg/m2 | Freq: Once | INTRAVENOUS | Status: AC
  Administered 2021-12-26: 220 mg via INTRAVENOUS
  Filled 2021-12-26: qty 11

## 2021-12-26 MED ORDER — PEGFILGRASTIM 6 MG/0.6ML ~~LOC~~ PSKT
6.0000 mg | PREFILLED_SYRINGE | Freq: Once | SUBCUTANEOUS | Status: AC
  Administered 2021-12-26: 6 mg via SUBCUTANEOUS
  Filled 2021-12-26: qty 0.6

## 2021-12-26 MED ORDER — SODIUM CHLORIDE 0.9 % IV SOLN
10.0000 mg | Freq: Once | INTRAVENOUS | Status: AC
  Administered 2021-12-26: 10 mg via INTRAVENOUS
  Filled 2021-12-26: qty 10

## 2021-12-26 MED ORDER — HEPARIN SOD (PORK) LOCK FLUSH 100 UNIT/ML IV SOLN
500.0000 [IU] | Freq: Once | INTRAVENOUS | Status: AC | PRN
  Administered 2021-12-26: 500 [IU]

## 2021-12-26 MED ORDER — SODIUM CHLORIDE 0.9% FLUSH
10.0000 mL | INTRAVENOUS | Status: DC | PRN
  Administered 2021-12-26: 10 mL

## 2021-12-26 NOTE — Patient Instructions (Signed)
[facility] MEDICAL ONCOLOGY  Discharge Instructions: Thank you for choosing [facility] to provide your oncology and hematology care.   If you have a lab appointment with the Cancer Center, please go directly to the Cancer Center and check in at the registration area.   Wear comfortable clothing and clothing appropriate for easy access to any Portacath or PICC line.   We strive to give you quality time with your provider. You may need to reschedule your appointment if you arrive late (15 or more minutes).  Arriving late affects you and other patients whose appointments are after yours.  Also, if you miss three or more appointments without notifying the office, you may be dismissed from the clinic at the provider's discretion.      For prescription refill requests, have your pharmacy contact our office and allow 72 hours for refills to be completed.    Today you received the following chemotherapy and/or immunotherapy agents: Etoposide      To help prevent nausea and vomiting after your treatment, we encourage you to take your nausea medication as directed.  BELOW ARE SYMPTOMS THAT SHOULD BE REPORTED IMMEDIATELY: *FEVER GREATER THAN 100.4 F (38 C) OR HIGHER *CHILLS OR SWEATING *NAUSEA AND VOMITING THAT IS NOT CONTROLLED WITH YOUR NAUSEA MEDICATION *UNUSUAL SHORTNESS OF BREATH *UNUSUAL BRUISING OR BLEEDING *URINARY PROBLEMS (pain or burning when urinating, or frequent urination) *BOWEL PROBLEMS (unusual diarrhea, constipation, pain near the anus) TENDERNESS IN MOUTH AND THROAT WITH OR WITHOUT PRESENCE OF ULCERS (sore throat, sores in mouth, or a toothache) UNUSUAL RASH, SWELLING OR PAIN  UNUSUAL VAGINAL DISCHARGE OR ITCHING   Items with * indicate a potential emergency and should be followed up as soon as possible or go to the Emergency Department if any problems should occur.  Please show the CHEMOTHERAPY ALERT CARD or IMMUNOTHERAPY ALERT CARD at check-in to  the Emergency Department and triage nurse.  Should you have questions after your visit or need to cancel or reschedule your appointment, please contact [facility] MEDICAL ONCOLOGY  Dept: [phone]  and follow the prompts.  Office hours are 8:00 a.m. to 4:30 p.m. Monday - Friday. Please note that voicemails left after 4:00 p.m. may not be returned until the following business day.  We are closed weekends and major holidays. You have access to a nurse at all times for urgent questions. Please call the main number to the clinic Dept: [phone] and follow the prompts.   For any non-urgent questions, you may also contact your provider using MyChart. We now offer e-Visits for anyone 18 and older to request care online for non-urgent symptoms. For details visit [email or web address].   Also download the MyChart app! Go to the app store, search "MyChart", open the app, select , and log in with your MyChart username and password.  Masks are optional in the cancer centers. If you would like for your care team to wear a mask while they are taking care of you, please let them know. You may have one support person who is at least 31 years old accompany you for your appointments. 

## 2021-12-27 ENCOUNTER — Other Ambulatory Visit: Payer: Self-pay

## 2021-12-30 ENCOUNTER — Encounter: Payer: Self-pay | Admitting: Hematology

## 2022-01-07 ENCOUNTER — Other Ambulatory Visit: Payer: Self-pay

## 2022-01-07 DIAGNOSIS — C7A1 Malignant poorly differentiated neuroendocrine tumors: Secondary | ICD-10-CM

## 2022-01-07 NOTE — Progress Notes (Signed)
Referral faxed to [facility]/ Dr Dennison Nancy

## 2022-01-08 ENCOUNTER — Other Ambulatory Visit: Payer: Self-pay

## 2022-01-11 ENCOUNTER — Other Ambulatory Visit: Payer: Self-pay

## 2022-01-14 ENCOUNTER — Other Ambulatory Visit: Payer: Self-pay

## 2022-01-14 DIAGNOSIS — C7A1 Malignant poorly differentiated neuroendocrine tumors: Secondary | ICD-10-CM

## 2022-01-16 ENCOUNTER — Inpatient Hospital Stay (HOSPITAL_BASED_OUTPATIENT_CLINIC_OR_DEPARTMENT_OTHER): Payer: 59 | Admitting: Hematology

## 2022-01-16 ENCOUNTER — Inpatient Hospital Stay: Payer: 59 | Attending: Hematology

## 2022-01-16 VITALS — BP 121/92 | HR 110 | Temp 98.1°F | Resp 18 | Ht 72.0 in | Wt 310.3 lb

## 2022-01-16 DIAGNOSIS — Z95828 Presence of other vascular implants and grafts: Secondary | ICD-10-CM

## 2022-01-16 DIAGNOSIS — C7A1 Malignant poorly differentiated neuroendocrine tumors: Secondary | ICD-10-CM

## 2022-01-16 DIAGNOSIS — C7A8 Other malignant neuroendocrine tumors: Secondary | ICD-10-CM | POA: Insufficient documentation

## 2022-01-16 DIAGNOSIS — C7B8 Other secondary neuroendocrine tumors: Secondary | ICD-10-CM | POA: Diagnosis not present

## 2022-01-16 LAB — CMP (CANCER CENTER ONLY)
ALT: 96 U/L — ABNORMAL HIGH (ref 0–44)
AST: 25 U/L (ref 15–41)
Albumin: 3.9 g/dL (ref 3.5–5.0)
Alkaline Phosphatase: 131 U/L — ABNORMAL HIGH (ref 38–126)
Anion gap: 6 (ref 5–15)
BUN: 12 mg/dL (ref 6–20)
CO2: 26 mmol/L (ref 22–32)
Calcium: 9.4 mg/dL (ref 8.9–10.3)
Chloride: 107 mmol/L (ref 98–111)
Creatinine: 0.9 mg/dL (ref 0.61–1.24)
GFR, Estimated: >60 mL/min (ref >60)
Glucose, Bld: 142 mg/dL — ABNORMAL HIGH (ref 70–99)
Potassium: 3.7 mmol/L (ref 3.5–5.1)
Sodium: 139 mmol/L (ref 135–145)
Total Bilirubin: 0.3 mg/dL (ref 0.3–1.2)
Total Protein: 7.9 g/dL (ref 6.5–8.1)

## 2022-01-16 LAB — CBC WITH DIFFERENTIAL (CANCER CENTER ONLY)
Abs Immature Granulocytes: 0.04 10*3/uL (ref 0.00–0.07)
Basophils Absolute: 0 10*3/uL (ref 0.0–0.1)
Basophils Relative: 0 %
Eosinophils Absolute: 0.1 10*3/uL (ref 0.0–0.5)
Eosinophils Relative: 1 %
HCT: 34.5 % — ABNORMAL LOW (ref 39.0–52.0)
Hemoglobin: 11.5 g/dL — ABNORMAL LOW (ref 13.0–17.0)
Immature Granulocytes: 0 %
Lymphocytes Relative: 15 %
Lymphs Abs: 1.7 10*3/uL (ref 0.7–4.0)
MCH: 29.4 pg (ref 26.0–34.0)
MCHC: 33.3 g/dL (ref 30.0–36.0)
MCV: 88.2 fL (ref 80.0–100.0)
Monocytes Absolute: 1 10*3/uL (ref 0.1–1.0)
Monocytes Relative: 9 %
Neutro Abs: 8.1 10*3/uL — ABNORMAL HIGH (ref 1.7–7.7)
Neutrophils Relative %: 75 %
Platelet Count: 555 10*3/uL — ABNORMAL HIGH (ref 150–400)
RBC: 3.91 MIL/uL — ABNORMAL LOW (ref 4.22–5.81)
RDW: 14.7 % (ref 11.5–15.5)
WBC Count: 10.9 10*3/uL — ABNORMAL HIGH (ref 4.0–10.5)
nRBC: 0 % (ref 0.0–0.2)

## 2022-01-16 MED ORDER — HEPARIN SOD (PORK) LOCK FLUSH 100 UNIT/ML IV SOLN
500.0000 [IU] | Freq: Once | INTRAVENOUS | Status: AC
  Administered 2022-01-16: 500 [IU]

## 2022-01-16 MED ORDER — SODIUM CHLORIDE 0.9% FLUSH
10.0000 mL | Freq: Once | INTRAVENOUS | Status: AC
  Administered 2022-01-16: 10 mL

## 2022-01-16 NOTE — Progress Notes (Signed)
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  HISTORY OF PRESENTING ILLNESS:   Leon Fischer. is a wonderful 31 y.o. 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 He presents today accompanied by his mother. He reports He is doing well.  He reports no color change in stool.  He notes that he is eating well but feels food just sits in stomach.  We discussed getting additional scans and labs for further evaluation and treatment which he was agreeable to. We further discussed getting MRI of brain and PET scan which he was agreeable to.  He notes that he is trying to hold it together mentally and is still trying to process his new diagnosis. We discussed the options of psychological or spiritual counseling which he has not decided on at this time. He was advised that may call back if he decides that he would like to pursue either.  We discussed getting a port a cath installed which he was agreeable to.  He recently had a biliary stent put into common bile duct.   No abdominal pain or change in bowel habits. No new or unexpected weight loss. No other new or acute focal symptoms.  We discussed his recent stomach biopsy done 07/13/2021 which sowed gastropathy and minor chronic gastritis with lymphoid aggregate. Negative for H. Pylori.  We discussed recent cytology done 07/13/2021 which showed presence of malignant cells in pancreas and poorly differentiated/high grade neuroendocrine carcinoma.  We discussed upper endoscopy done 07/13/2021 revealed mass in pancreatic head  and some enlarged lymph nodes were seen in the celiac and peripancreatic regions.  Labs done today were reviewed in detail.  INTERVAL HISTORY:  Leon Fischer. Is a 31 y.o. 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   Patient was last seen by me on 12/24/2021 and was doing well overall.   Patient reports he is doing well without any new medical concerns since our last visit. He reports that he has not heard back from [facility] for his second opinion, will send the referral again.  Patient denies any toxicities with his last chemotherapy. He denies fever, chills, coughs, new infection, new lumps/bumps, abdominal pain, back pain, diarrhea, breathing problems, or leg swelling.   He reports that he wants to go back to work because he is feeling better. His work involves lifting boxes and cans. Part-time is 4 days a week and full-time is 5 days. Patient notes that he has to reapply at his work and needs an release letter stating he is able to work.   Patient has not received influenza vaccine or COVID-19 Booster.   MEDICAL HISTORY:  Past Medical History:  Diagnosis Date   Asthma    Neuroendocrine cancer ([place])     SURGICAL HISTORY: Past Surgical History:  Procedure Laterality Date   BILIARY DILATION  07/13/2021   Procedure: BILIARY DILATION;  Surgeon: Clarene Essex, MD;  Location: [facility];  Service: Gastroenterology;;   BILIARY STENT PLACEMENT N/A 07/13/2021   Procedure: BILIARY STENT PLACEMENT;  Surgeon: Clarene Essex, MD;  Location: [name];  Service: Gastroenterology;  Laterality: N/A;   BILIARY STENT PLACEMENT N/A  11/05/2021   Procedure: BILIARY STENT PLACEMENT;  Surgeon: Clarene Essex, MD;  Location: [name];  Service: Gastroenterology;  Laterality: N/A;   BIOPSY  07/13/2021   Procedure: BIOPSY;  Surgeon: Rush Landmark Telford Nab., MD;  Location: [facility];  Service: Gastroenterology;;   CHOLECYSTECTOMY  N/A 11/07/2021   Procedure: LAPAROSCOPIC CHOLECYSTECTOMY;  Surgeon: Clovis Riley, MD;  Location: [facility];  Service: General;  Laterality: N/A;   ENDOSCOPIC RETROGRADE CHOLANGIOPANCREATOGRAPHY (ERCP) WITH PROPOFOL N/A 07/13/2021   Procedure: ENDOSCOPIC RETROGRADE CHOLANGIOPANCREATOGRAPHY (ERCP) WITH PROPOFOL;  Surgeon: Clarene Essex, MD;  Location: [name];  Service: Gastroenterology;  Laterality: N/A;   ERCP N/A 11/05/2021   Procedure: ENDOSCOPIC RETROGRADE CHOLANGIOPANCREATOGRAPHY (ERCP);  Surgeon: Clarene Essex, MD;  Location: [facility];  Service: Gastroenterology;  Laterality: N/A;   ESOPHAGOGASTRODUODENOSCOPY (EGD) WITH PROPOFOL N/A 07/13/2021   Procedure: ESOPHAGOGASTRODUODENOSCOPY (EGD) WITH PROPOFOL;  Surgeon: Rush Landmark Telford Nab., MD;  Location: [name];  Service: Gastroenterology;  Laterality: N/A;   EUS N/A 07/13/2021   Procedure: UPPER ENDOSCOPIC ULTRASOUND (EUS) LINEAR;  Surgeon: Irving Copas., MD;  Location: [name];  Service: Gastroenterology;  Laterality: N/A;   FINE NEEDLE ASPIRATION  07/13/2021   Procedure: FINE NEEDLE ASPIRATION (FNA) LINEAR;  Surgeon: Irving Copas., MD;  Location: [facility];  Service: Gastroenterology;;   IR IMAGING GUIDED PORT INSERTION  07/31/2021   SPHINCTEROTOMY  07/13/2021   Procedure: Joan Mayans;  Surgeon: Clarene Essex, MD;  Location: [name];  Service: Gastroenterology;;    SOCIAL HISTORY: Social History   Socioeconomic History   Marital status: Single    Spouse name: Not on file   Number of children: 0   Years of education: Not on file   Highest education level: Some college, no degree  Occupational History   Not on file  Tobacco Use   Smoking status: Some Days    Types: Cigarettes   Smokeless tobacco: Never  Vaping Use   Vaping Use: Never used  Substance and Sexual Activity   Alcohol use: Yes    Comment: social   Drug use: No   Sexual activity: Never  Other Topics Concern   Not on file  Social  History Narrative   Not on file   Social Determinants of Health   Financial Resource Strain: Low Risk  (04/14/2017)   Overall Financial Resource Strain (CARDIA)    Difficulty of Paying Living Expenses: Not hard at all  Food Insecurity: No Food Insecurity (11/07/2021)   Hunger Vital Sign    Worried About Running Out of Food in the Last Year: Never true    Ran Out of Food in the Last Year: Never true  Transportation Needs: No Transportation Needs (11/07/2021)   PRAPARE - Hydrologist (Medical): No    Lack of Transportation (Non-Medical): No  Physical Activity: Inactive (04/14/2017)   Exercise Vital Sign    Days of Exercise per Week: 0 days    Minutes of Exercise per Session: 0 min  Stress: Stress Concern Present (04/14/2017)   [place]    Feeling of Stress : Rather much  Social Connections: Moderately Isolated (04/14/2017)   Social Connection and Isolation Panel [NHANES]    Frequency of Communication with Friends and Family: More than three times a week    Frequency of Social Gatherings with Friends and Family: More than three times a week    Attends Religious Services: Never    Marine scientist or Organizations: No  Attends Archivist Meetings: Never    Marital Status: Never married  Intimate Partner Violence: Not At Risk (11/07/2021)   Humiliation, Afraid, Rape, and Kick questionnaire    Fear of Current or Ex-Partner: No    Emotionally Abused: No    Physically Abused: No    Sexually Abused: No    FAMILY HISTORY: Family History  Problem Relation Age of Onset   Drug abuse Father    ALLERGIES:  is allergic to shellfish allergy and other.  MEDICATIONS:  Current Outpatient Medications  Medication Sig Dispense Refill   acetaminophen (TYLENOL) 500 MG tablet Take 1,000 mg by mouth every 6 (six) hours as needed for mild pain.     albuterol (VENTOLIN HFA) 108 (90 Base)  MCG/ACT inhaler INHALE 2 PUFFS BY MOUTH EVERY 4 HOURS AS NEEDED FOR WHEEZING FOR SHORTNESS OF BREATH 18 g 0   dexamethasone (DECADRON) 4 MG tablet Take 2 tablets (8 mg total) by mouth daily. Start the day after chemotherapy for 1 day. 30 tablet 1   diclofenac Sodium (VOLTAREN) 1 % GEL Apply 4 g topically 4 (four) times daily. (Patient taking differently: Apply 4 g topically 4 (four) times daily as needed (pain).) 100 g 0   lidocaine-prilocaine (EMLA) cream Apply to affected area once (Patient taking differently: Apply 1 Application topically daily as needed (access port). Apply to affected area once) 30 g 3   ondansetron (ZOFRAN) 8 MG tablet Take 1 tablet (8 mg total) by mouth 2 (two) times daily as needed for refractory nausea / vomiting. Start on day 3 after carboplatin chemo. 30 tablet 1   oxyCODONE (OXY IR/ROXICODONE) 5 MG immediate release tablet Take 1 tablet (5 mg total) by mouth every 6 (six) hours as needed for severe pain or moderate pain. 20 tablet 0   polyethylene glycol (MIRALAX / GLYCOLAX) 17 g packet Take 17 g by mouth daily. (Patient taking differently: Take 17 g by mouth daily as needed for mild constipation or moderate constipation.) 14 each 0   prochlorperazine (COMPAZINE) 10 MG tablet Take 1 tablet (10 mg total) by mouth every 6 (six) hours as needed (Nausea or vomiting). 30 tablet 1   senna-docusate (SENOKOT-S) 8.6-50 MG tablet Take 2 tablets by mouth 2 (two) times daily. (Patient taking differently: Take 2 tablets by mouth daily as needed for mild constipation or moderate constipation.) 30 tablet 0   No current facility-administered medications for this visit.    REVIEW OF SYSTEMS:    10 Point review of Systems was done is negative except as noted above.  PHYSICAL EXAMINATION: ECOG PERFORMANCE STATUS: 1 - Symptomatic but completely ambulatory  . Vitals:   01/16/22 1526  BP: (!) 121/92  Pulse: (!) 110  Resp: 18  Temp: 98.1 F (36.7 C)  SpO2: 99%     NAD GENERAL:alert, in no acute distress and comfortable SKIN: no acute rashes, no significant lesions EYES: conjunctiva are pink and non-injected, sclera anicteric NECK: supple, no JVD LYMPH:  no palpable lymphadenopathy in the cervical, axillary or inguinal regions LUNGS: clear to auscultation b/l with normal respiratory effort HEART: regular rate & rhythm ABDOMEN:  normoactive bowel sounds , non tender, not distended. Extremity: no pedal edema PSYCH: alert & oriented x 3 with fluent speech NEURO: no focal motor/sensory deficits  LABORATORY DATA:  I have reviewed the data as listed  .    Latest Ref Rng & Units 01/16/2022    2:36 PM 12/24/2021   11:59 AM 12/03/2021   12:21 PM  CBC  WBC 4.0 - 10.5 K/uL 10.9  10.3  6.0   Hemoglobin 13.0 - 17.0 g/dL 11.5  12.3  12.2   Hematocrit 39.0 - 52.0 % 34.5  35.7  36.8   Platelets 150 - 400 K/uL 555  447  293     .    Latest Ref Rng & Units 01/16/2022    2:36 PM 12/24/2021   11:59 AM 12/03/2021   12:21 PM  CMP  Glucose 70 - 99 mg/dL 142  107  100   BUN 6 - 20 mg/dL '12  6  13   '$ Creatinine 0.61 - 1.24 mg/dL 0.90  0.82  0.93   Sodium 135 - 145 mmol/L 139  136  139   Potassium 3.5 - 5.1 mmol/L 3.7  3.6  3.9   Chloride 98 - 111 mmol/L 107  98  106   CO2 22 - 32 mmol/L '26  31  29   '$ Calcium 8.9 - 10.3 mg/dL 9.4  10.0  9.2   Total Protein 6.5 - 8.1 g/dL 7.9  8.9  7.7   Total Bilirubin 0.3 - 1.2 mg/dL 0.3  0.3  0.3   Alkaline Phos 38 - 126 U/L 131  82  79   AST 15 - 41 U/L '25  18  14   '$ ALT 0 - 44 U/L 96  26  21    Cytology done 07/13/2021 revealed "FINAL MICROSCOPIC DIAGNOSIS:  A. PANCREAS, HEAD, FINE NEEDLE ASPIRATION:  - Malignant cells present  - Poorly differentiated/high-grade neuroendocrine carcinoma (see  comment)   B. CBD STRICTURE, BRUSHING:  - Atypical cells suspicious for tumor   C. BILIARY DILATION, BALLOON, REMOVAL:  - Atypical cells suspicious for tumor "  Surgical pathology done 07/13/2021 revealed "FINAL  MICROSCOPIC DIAGNOSIS:   A. STOMACH, BIOPSY:  Reactive gastropathy and minimal chronic gastritis with lymphoid  aggregate  Negative for H. pylori, intestinal metaplasia, dysplasia and carcinoma"  RADIOGRAPHIC STUDIES: I have personally reviewed the radiological images as listed and agreed with the findings in the report. No results found.   ASSESSMENT & PLAN:   31 y.o. very pleasant male with  1. 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 -Cytology done 07/13/2021 shows presence of malignant cells in pancreas and poorly differentiated/high grade neuroendocrine carcinoma.  PLAN: -Discussed lab results from today with the patient. CBC shows WBC of 10.9 K, hemoglobin of 11.5 K, hematocrit of 34.5, platelets of 555 K. CMP stable.  -Discussed with the patient that insurance did not approve the PET scan. -Discussed the recent genetic test results, which did not see any mutation.  -Referral to [facility] for additional opinion. The referral was sent last visit, but patient notes he did not get a call from [facility].  -Recommended infection precautions when working.  -Patient will call us if he develops new symptoms. -Recommended influenza vaccine and COVID-19 Booster.   -Schedule chest/abd/pelvis CT in 6 weeks.   Follow up: CT chest/abd/pelvis in 6 weeks RTC with Dr Irene Limbo with portflush and labs in 8 weeks  The total time spent in the appointment was 21 minutes* .  All of the patient's questions were answered with apparent satisfaction. The patient knows to call the clinic with any problems, questions or concerns.   Sullivan Lone MD MS AAHIVMS [facility] Hematology/Oncology Physician [facility]  .*Total Encounter Time as defined by the Centers for Medicare and Medicaid Services includes, in  addition to the face-to-face time of a patient visit (documented in the note above)  non-face-to-face time: obtaining and reviewing outside history, ordering and reviewing medications, tests or procedures, care coordination (communications with other health care professionals or caregivers) and documentation in the medical record.  I, Cleda Mccreedy, am acting as a Education administrator for Sullivan Lone, MD.  .I have reviewed the above documentation for accuracy and completeness, and I agree with the above. [name] MD

## 2022-01-20 ENCOUNTER — Other Ambulatory Visit: Payer: Self-pay

## 2022-01-22 ENCOUNTER — Other Ambulatory Visit: Payer: Self-pay

## 2022-01-22 ENCOUNTER — Other Ambulatory Visit: Payer: Self-pay | Admitting: Pharmacist

## 2022-01-22 ENCOUNTER — Encounter: Payer: Self-pay | Admitting: Hematology

## 2022-02-02 ENCOUNTER — Encounter (HOSPITAL_COMMUNITY): Payer: Self-pay

## 2022-02-02 ENCOUNTER — Emergency Department (HOSPITAL_COMMUNITY)
Admission: EM | Admit: 2022-02-02 | Discharge: 2022-02-02 | Disposition: A | Discharge status: Home / Self Care | Payer: 59 | Attending: Emergency Medicine | Admitting: Emergency Medicine

## 2022-02-02 ENCOUNTER — Emergency Department (HOSPITAL_COMMUNITY): Payer: 59

## 2022-02-02 DIAGNOSIS — M7918 Myalgia, other site: Secondary | ICD-10-CM | POA: Insufficient documentation

## 2022-02-02 DIAGNOSIS — M545 Low back pain, unspecified: Secondary | ICD-10-CM | POA: Diagnosis present

## 2022-02-02 MED ORDER — LIDOCAINE 5 % EX PTCH
1.0000 | MEDICATED_PATCH | CUTANEOUS | Status: DC
  Administered 2022-02-02: 1 via TRANSDERMAL
  Filled 2022-02-02: qty 1

## 2022-02-02 MED ORDER — METHOCARBAMOL 500 MG PO TABS: 500.0000 mg | ORAL_TABLET | Freq: Two times a day (BID) | ORAL | 0 refills | Status: DC

## 2022-02-02 NOTE — ED Triage Notes (Signed)
Pt presents with c/o pain in his upper right back/shoulder area. Pt reports the pain started last night at 8 pm and made it difficult for him to sleep last night. Pt denies any injury to that area.

## 2022-02-02 NOTE — ED Provider Notes (Cosign Needed)
[place] DEPT Provider Note   CSN: [id number] Arrival date & time: 02/02/22  8416     History  Chief Complaint  Patient presents with   Back Pain    Leon Fischer. is a 31 y.o. male.  31 yo male with complaint of pain in right shoulder blade onset 8pm last night while doing nothing in particular, no injuries, falls. Denies prior shoulder problems. Denies CP, SHOB, abd pain.  Pain is worse with movement and stretching.       Home Medications Prior to Admission medications   Medication Sig Start Date End Date Taking? Authorizing Provider  methocarbamol (ROBAXIN) 500 MG tablet Take 1 tablet (500 mg total) by mouth 2 (two) times daily. 02/02/22  Yes Tacy Learn, PA-C  acetaminophen (TYLENOL) 500 MG tablet Take 1,000 mg by mouth every 6 (six) hours as needed for mild pain.    [provider]  albuterol (VENTOLIN HFA) 108 (90 Base) MCG/ACT inhaler INHALE 2 PUFFS BY MOUTH EVERY 4 HOURS AS NEEDED FOR WHEEZING FOR SHORTNESS OF BREATH 11/19/21   [name], MD  diclofenac Sodium (VOLTAREN) 1 % GEL Apply 4 g topically 4 (four) times daily. Patient taking differently: Apply 4 g topically 4 (four) times daily as needed (pain). 11/12/21   Deno Etienne, DO  oxyCODONE (OXY IR/ROXICODONE) 5 MG immediate release tablet Take 1 tablet (5 mg total) by mouth every 6 (six) hours as needed for severe pain or moderate pain. 11/27/21   [name], MD  polyethylene glycol (MIRALAX / GLYCOLAX) 17 g packet Take 17 g by mouth daily. Patient taking differently: Take 17 g by mouth daily as needed for mild constipation or moderate constipation. 07/05/21   Regalado, Belkys A, MD  senna-docusate (SENOKOT-S) 8.6-50 MG tablet Take 2 tablets by mouth 2 (two) times daily. Patient taking differently: Take 2 tablets by mouth daily as needed for mild constipation or moderate constipation. 07/05/21   Regalado, Jerald Kief A, MD      Allergies    Shellfish  allergy and Other    Review of Systems   Review of Systems Negative except as per H PI Physical Exam Updated Vital Signs BP (!) 133/94 (BP Location: Left Arm)   Pulse 85   Temp 98 F (36.7 C) (Oral)   Resp 16   SpO2 97%  Physical Exam Vitals and nursing note reviewed.  Constitutional:      General: He is not in acute distress.    Appearance: He is well-developed. He is not diaphoretic.  HENT:     Head: Normocephalic and atraumatic.  Cardiovascular:     Pulses: Normal pulses.  Pulmonary:     Effort: Pulmonary effort is normal.  Musculoskeletal:     Cervical back: Neck supple.       Back:     Comments: Pain seems to more located along the medial border of the right scapula inferiorly.  There is no crepitus.  Body habitus limits exam.  No overlying rash in this area.  Skin:    General: Skin is warm and dry.     Findings: No erythema or rash.  Neurological:     Mental Status: He is alert and oriented to person, place, and time.  Psychiatric:        Behavior: Behavior normal.     ED Results / Procedures / Treatments   Labs (all labs ordered are listed, but only abnormal results are displayed) Labs Reviewed - No data  to display  EKG None  Radiology DG Ribs Unilateral W/Chest Right  Result Date: 02/02/2022 CLINICAL DATA:  Pain in upper right back and shoulder area. No trauma. EXAM: RIGHT RIBS AND CHEST - 3+ VIEW COMPARISON:  November 07, 2021 FINDINGS: The right Port-A-Cath is in stable and adequate position. No pneumothorax. The heart, hila, and mediastinum are unremarkable. No pulmonary nodules or masses are identified. No infiltrates. No fractures or bony lesions identified. A common bile duct stent is identified. The patient is status post cholecystectomy. No other abnormalities are noted. IMPRESSION: No cause for right upper back pain identified. The patient has a right Port-A-Cath. Electronically Signed   By: Dorise Bullion III M.D.   On: 02/02/2022 09:36     Procedures Procedures    Medications Ordered in ED Medications  lidocaine (LIDODERM) 5 % 1 patch (1 patch Transdermal Patch Applied 02/02/22 0941)    ED Course/ Medical Decision Making/ A&P                           Medical Decision Making Amount and/or Complexity of Data Reviewed Radiology: ordered.  Risk Prescription drug management.   31 year old male with past medical history of neuroendocrine cancer and asthma presents with complaint of pain in his right back just below his scapula without injury.  He denies any associated symptoms specifically abdominal pain, shortness of breath or chest pain.  Pain is reproduced with palpation of this area.  X-ray of the right rib series does not show any acute bony abnormality.  Review of his prior records on file including recent scans are without metastatic process through the bones.  Lidocaine patch is applied, provided with prescription for Robaxin.  Advised to follow-up with his oncologist or see his PCP for recheck.        Final Clinical Impression(s) / ED Diagnoses Final diagnoses:  Musculoskeletal pain    Rx / DC Orders ED Discharge Orders          Ordered    methocarbamol (ROBAXIN) 500 MG tablet  2 times daily        02/02/22 1000              Tacy Learn, PA-C 02/02/22 1505

## 2022-02-02 NOTE — Discharge Instructions (Addendum)
Follow up with your PCP or oncologist for recheck. Return to the ER for worsening or concerning symptoms. Apply topical lidocaine patch to area as directed, these are available over the counter. Take Robaxin as needed as prescribed, this is a muscle relaxant, it may take a few days for this medicine to help. Can also apply warm compresses to area and try gentle stretching.

## 2022-03-05 ENCOUNTER — Encounter: Payer: Self-pay | Admitting: Hematology

## 2022-03-08 ENCOUNTER — Other Ambulatory Visit: Payer: Self-pay

## 2022-03-08 DIAGNOSIS — C7A1 Malignant poorly differentiated neuroendocrine tumors: Secondary | ICD-10-CM

## 2022-03-12 ENCOUNTER — Inpatient Hospital Stay (HOSPITAL_BASED_OUTPATIENT_CLINIC_OR_DEPARTMENT_OTHER): Payer: 59 | Admitting: Hematology

## 2022-03-12 ENCOUNTER — Other Ambulatory Visit: Payer: Self-pay

## 2022-03-12 ENCOUNTER — Inpatient Hospital Stay: Payer: 59 | Attending: Hematology

## 2022-03-12 VITALS — BP 134/90 | HR 89 | Temp 98.1°F | Resp 19 | Ht 72.0 in | Wt 304.8 lb

## 2022-03-12 DIAGNOSIS — C7B8 Other secondary neuroendocrine tumors: Secondary | ICD-10-CM | POA: Diagnosis not present

## 2022-03-12 DIAGNOSIS — Z95828 Presence of other vascular implants and grafts: Secondary | ICD-10-CM

## 2022-03-12 DIAGNOSIS — C7A8 Other malignant neuroendocrine tumors: Secondary | ICD-10-CM | POA: Insufficient documentation

## 2022-03-12 DIAGNOSIS — C7A1 Malignant poorly differentiated neuroendocrine tumors: Secondary | ICD-10-CM | POA: Diagnosis not present

## 2022-03-12 LAB — CBC WITH DIFFERENTIAL (CANCER CENTER ONLY)
Abs Immature Granulocytes: 0.01 10*3/uL (ref 0.00–0.07)
Basophils Absolute: 0 10*3/uL (ref 0.0–0.1)
Basophils Relative: 0 %
Eosinophils Absolute: 0.3 10*3/uL (ref 0.0–0.5)
Eosinophils Relative: 4 %
HCT: 34.7 % — ABNORMAL LOW (ref 39.0–52.0)
Hemoglobin: 11.6 g/dL — ABNORMAL LOW (ref 13.0–17.0)
Immature Granulocytes: 0 %
Lymphocytes Relative: 20 %
Lymphs Abs: 1.5 10*3/uL (ref 0.7–4.0)
MCH: 29.5 pg (ref 26.0–34.0)
MCHC: 33.4 g/dL (ref 30.0–36.0)
MCV: 88.3 fL (ref 80.0–100.0)
Monocytes Absolute: 0.7 10*3/uL (ref 0.1–1.0)
Monocytes Relative: 9 %
Neutro Abs: 5.2 10*3/uL (ref 1.7–7.7)
Neutrophils Relative %: 67 %
Platelet Count: 235 10*3/uL (ref 150–400)
RBC: 3.93 MIL/uL — ABNORMAL LOW (ref 4.22–5.81)
RDW: 14.9 % (ref 11.5–15.5)
WBC Count: 7.8 10*3/uL (ref 4.0–10.5)
nRBC: 0 % (ref 0.0–0.2)

## 2022-03-12 LAB — CMP (CANCER CENTER ONLY)
ALT: 13 U/L (ref 0–44)
AST: 15 U/L (ref 15–41)
Albumin: 4 g/dL (ref 3.5–5.0)
Alkaline Phosphatase: 68 U/L (ref 38–126)
Anion gap: 8 (ref 5–15)
BUN: 6 mg/dL (ref 6–20)
CO2: 27 mmol/L (ref 22–32)
Calcium: 9.2 mg/dL (ref 8.9–10.3)
Chloride: 104 mmol/L (ref 98–111)
Creatinine: 0.68 mg/dL (ref 0.61–1.24)
GFR, Estimated: >60 mL/min (ref >60)
Glucose, Bld: 89 mg/dL (ref 70–99)
Potassium: 3.4 mmol/L — ABNORMAL LOW (ref 3.5–5.1)
Sodium: 139 mmol/L (ref 135–145)
Total Bilirubin: 0.5 mg/dL (ref 0.3–1.2)
Total Protein: 6.8 g/dL (ref 6.5–8.1)

## 2022-03-12 MED ORDER — SODIUM CHLORIDE 0.9% FLUSH
10.0000 mL | Freq: Once | INTRAVENOUS | Status: AC
  Administered 2022-03-12: 10 mL

## 2022-03-12 MED ORDER — HEPARIN SOD (PORK) LOCK FLUSH 100 UNIT/ML IV SOLN
500.0000 [IU] | Freq: Once | INTRAVENOUS | Status: AC
  Administered 2022-03-12: 500 [IU]

## 2022-03-12 NOTE — Progress Notes (Signed)
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  HISTORY OF PRESENTING ILLNESS:   Leon Fischer. is a wonderful 32 y.o. 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 He presents today accompanied by his mother. He reports He is doing well.  He reports no color change in stool.  He notes that he is eating well but feels food just sits in stomach.  We discussed getting additional scans and labs for further evaluation and treatment which he was agreeable to. We further discussed getting MRI of brain and PET scan which he was agreeable to.  He notes that he is trying to hold it together mentally and is still trying to process his new diagnosis. We discussed the options of psychological or spiritual counseling which he has not decided on at this time. He was advised that may call back if he decides that he would like to pursue either.  We discussed getting a port a cath installed which he was agreeable to.  He recently had a biliary stent put into common bile duct.   No abdominal pain or change in bowel habits. No new or unexpected weight loss. No other new or acute focal symptoms.  We discussed his recent stomach biopsy done 07/13/2021 which sowed gastropathy and minor chronic gastritis with lymphoid aggregate. Negative for H. Pylori.  We discussed recent cytology done 07/13/2021 which showed presence of malignant cells in pancreas and poorly differentiated/high grade neuroendocrine carcinoma.  We discussed upper endoscopy done 07/13/2021 revealed mass in pancreatic head and  some enlarged lymph nodes were seen in the celiac and peripancreatic regions.  Labs done today were reviewed in detail.  INTERVAL HISTORY:  Leon Fischer. Is a 32 y.o. 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   Patient was last seen by me on 01/16/2022 and he was doing well overall.   Patient is accompanied by his mother during this visit. He reports he has been doing well overall without any new medical concerns. Patient has not had his PET scan and has not heard back from [facility] since our last visit.   He denies fever, chills, night sweats, unexpected weight loss, back pain, abdominal pain, shortness of breath, appetite loss, chest pain, urination problem, or leg swelling. Marland Kitchen   MEDICAL HISTORY:  Past Medical History:  Diagnosis Date   Asthma    Neuroendocrine cancer ([place])     SURGICAL HISTORY: Past Surgical History:  Procedure Laterality Date   BILIARY DILATION  07/13/2021   Procedure: BILIARY DILATION;  Surgeon: Clarene Essex, MD;  Location: [facility];  Service: Gastroenterology;;   BILIARY STENT PLACEMENT N/A 07/13/2021   Procedure: BILIARY STENT PLACEMENT;  Surgeon: Clarene Essex, MD;  Location: [name];  Service: Gastroenterology;  Laterality: N/A;   BILIARY STENT PLACEMENT N/A 11/05/2021   Procedure: BILIARY STENT PLACEMENT;  Surgeon: Clarene Essex, MD;  Location: [name];  Service: Gastroenterology;  Laterality: N/A;   BIOPSY  07/13/2021   Procedure: BIOPSY;  Surgeon: Rush Landmark Telford Nab., MD;  Location: [facility];  Service: Gastroenterology;;   CHOLECYSTECTOMY N/A 11/07/2021   Procedure: LAPAROSCOPIC CHOLECYSTECTOMY;  Surgeon: Clovis Riley,  MD;  Location: [facility];  Service: General;  Laterality: N/A;   ENDOSCOPIC RETROGRADE CHOLANGIOPANCREATOGRAPHY (ERCP) WITH PROPOFOL N/A 07/13/2021   Procedure: ENDOSCOPIC RETROGRADE CHOLANGIOPANCREATOGRAPHY (ERCP) WITH PROPOFOL;  Surgeon:  Clarene Essex, MD;  Location: [name];  Service: Gastroenterology;  Laterality: N/A;   ERCP N/A 11/05/2021   Procedure: ENDOSCOPIC RETROGRADE CHOLANGIOPANCREATOGRAPHY (ERCP);  Surgeon: Clarene Essex, MD;  Location: [facility];  Service: Gastroenterology;  Laterality: N/A;   ESOPHAGOGASTRODUODENOSCOPY (EGD) WITH PROPOFOL N/A 07/13/2021   Procedure: ESOPHAGOGASTRODUODENOSCOPY (EGD) WITH PROPOFOL;  Surgeon: Rush Landmark Telford Nab., MD;  Location: [name];  Service: Gastroenterology;  Laterality: N/A;   EUS N/A 07/13/2021   Procedure: UPPER ENDOSCOPIC ULTRASOUND (EUS) LINEAR;  Surgeon: Irving Copas., MD;  Location: [name];  Service: Gastroenterology;  Laterality: N/A;   FINE NEEDLE ASPIRATION  07/13/2021   Procedure: FINE NEEDLE ASPIRATION (FNA) LINEAR;  Surgeon: Irving Copas., MD;  Location: [facility];  Service: Gastroenterology;;   IR IMAGING GUIDED PORT INSERTION  07/31/2021   SPHINCTEROTOMY  07/13/2021   Procedure: Joan Mayans;  Surgeon: Clarene Essex, MD;  Location: [name];  Service: Gastroenterology;;    SOCIAL HISTORY: Social History   Socioeconomic History   Marital status: Single    Spouse name: Not on file   Number of children: 0   Years of education: Not on file   Highest education level: Some college, no degree  Occupational History   Not on file  Tobacco Use   Smoking status: Some Days    Types: Cigarettes   Smokeless tobacco: Never  Vaping Use   Vaping Use: Never used  Substance and Sexual Activity   Alcohol use: Yes    Comment: social   Drug use: No   Sexual activity: Never  Other Topics Concern   Not on file  Social History Narrative   Not on file   Social Determinants of Health   Financial Resource Strain: Low Risk  (04/14/2017)   Overall Financial Resource Strain (CARDIA)    Difficulty of Paying Living Expenses: Not hard at all  Food Insecurity: No Food Insecurity (11/07/2021)   Hunger Vital Sign    Worried About Running  Out of Food in the Last Year: Never true    Ran Out of Food in the Last Year: Never true  Transportation Needs: No Transportation Needs (11/07/2021)   PRAPARE - Hydrologist (Medical): No    Lack of Transportation (Non-Medical): No  Physical Activity: Inactive (04/14/2017)   Exercise Vital Sign    Days of Exercise per Week: 0 days    Minutes of Exercise per Session: 0 min  Stress: Stress Concern Present (04/14/2017)   [place]    Feeling of Stress : Rather much  Social Connections: Moderately Isolated (04/14/2017)   Social Connection and Isolation Panel [NHANES]    Frequency of Communication with Friends and Family: More than three times a week    Frequency of Social Gatherings with Friends and Family: More than three times a week    Attends Religious Services: Never    Marine scientist or Organizations: No    Attends Archivist Meetings: Never    Marital Status: Never married  Intimate Partner Violence: Not At Risk (11/07/2021)   Humiliation, Afraid, Rape, and Kick questionnaire    Fear of Current or Ex-Partner: No    Emotionally Abused: No    Physically Abused: No    Sexually Abused: No  FAMILY HISTORY: Family History  Problem Relation Age of Onset   Drug abuse Father    ALLERGIES:  is allergic to shellfish allergy and other.  MEDICATIONS:  Current Outpatient Medications  Medication Sig Dispense Refill   acetaminophen (TYLENOL) 500 MG tablet Take 1,000 mg by mouth every 6 (six) hours as needed for mild pain.     albuterol (VENTOLIN HFA) 108 (90 Base) MCG/ACT inhaler INHALE 2 PUFFS BY MOUTH EVERY 4 HOURS AS NEEDED FOR WHEEZING FOR SHORTNESS OF BREATH 18 g 0   diclofenac Sodium (VOLTAREN) 1 % GEL Apply 4 g topically 4 (four) times daily. (Patient taking differently: Apply 4 g topically 4 (four) times daily as needed (pain).) 100 g 0   methocarbamol (ROBAXIN) 500 MG  tablet Take 1 tablet (500 mg total) by mouth 2 (two) times daily. 20 tablet 0   oxyCODONE (OXY IR/ROXICODONE) 5 MG immediate release tablet Take 1 tablet (5 mg total) by mouth every 6 (six) hours as needed for severe pain or moderate pain. 20 tablet 0   polyethylene glycol (MIRALAX / GLYCOLAX) 17 g packet Take 17 g by mouth daily. (Patient taking differently: Take 17 g by mouth daily as needed for mild constipation or moderate constipation.) 14 each 0   senna-docusate (SENOKOT-S) 8.6-50 MG tablet Take 2 tablets by mouth 2 (two) times daily. (Patient taking differently: Take 2 tablets by mouth daily as needed for mild constipation or moderate constipation.) 30 tablet 0   No current facility-administered medications for this visit.    REVIEW OF SYSTEMS:    10 Point review of Systems was done is negative except as noted above.  PHYSICAL EXAMINATION: ECOG PERFORMANCE STATUS: 1 - Symptomatic but completely ambulatory  . Vitals:   03/12/22 1340  BP: (!) 134/90  Pulse: 89  Resp: 19  Temp: 98.1 F (36.7 C)  SpO2: 100%  NAD GENERAL:alert, in no acute distress and comfortable SKIN: no acute rashes, no significant lesions EYES: conjunctiva are pink and non-injected, sclera anicteric NECK: supple, no JVD LYMPH:  no palpable lymphadenopathy in the cervical, axillary or inguinal regions LUNGS: clear to auscultation b/l with normal respiratory effort HEART: regular rate & rhythm ABDOMEN:  normoactive bowel sounds , non tender, not distended. Extremity: no pedal edema PSYCH: alert & oriented x 3 with fluent speech NEURO: no focal motor/sensory deficits  LABORATORY DATA:  I have reviewed the data as listed  .    Latest Ref Rng & Units 03/12/2022    1:01 PM 01/16/2022    2:36 PM 12/24/2021   11:59 AM  CBC  WBC 4.0 - 10.5 K/uL 7.8  10.9  10.3   Hemoglobin 13.0 - 17.0 g/dL 11.6  11.5  12.3   Hematocrit 39.0 - 52.0 % 34.7  34.5  35.7   Platelets 150 - 400 K/uL 235  555  447     .     Latest Ref Rng & Units 03/12/2022    1:01 PM 01/16/2022    2:36 PM 12/24/2021   11:59 AM  CMP  Glucose 70 - 99 mg/dL 89  142  107   BUN 6 - 20 mg/dL 6  12  6   $ Creatinine 0.61 - 1.24 mg/dL 0.68  0.90  0.82   Sodium 135 - 145 mmol/L 139  139  136   Potassium 3.5 - 5.1 mmol/L 3.4  3.7  3.6   Chloride 98 - 111 mmol/L 104  107  98   CO2 22 - 32 mmol/L  $27  26  31   n$ Calcium 8.9 - 10.3 mg/dL 9.2  9.4  10.0   Total Protein 6.5 - 8.1 g/dL 6.8  7.9  8.9   Total Bilirubin 0.3 - 1.2 mg/dL 0.5  0.3  0.3   Alkaline Phos 38 - 126 U/L 68  131  82   AST 15 - 41 U/L 15  25  18   $ ALT 0 - 44 U/L 13  96  26    Cytology done 07/13/2021 revealed "FINAL MICROSCOPIC DIAGNOSIS:  A. PANCREAS, HEAD, FINE NEEDLE ASPIRATION:  - Malignant cells present  - Poorly differentiated/high-grade neuroendocrine carcinoma (see  comment)   B. CBD STRICTURE, BRUSHING:  - Atypical cells suspicious for tumor   C. BILIARY DILATION, BALLOON, REMOVAL:  - Atypical cells suspicious for tumor "  Surgical pathology done 07/13/2021 revealed "FINAL MICROSCOPIC DIAGNOSIS:   A. STOMACH, BIOPSY:  Reactive gastropathy and minimal chronic gastritis with lymphoid  aggregate  Negative for H. pylori, intestinal metaplasia, dysplasia and carcinoma"  RADIOGRAPHIC STUDIES: I have personally reviewed the radiological images as listed and agreed with the findings in the report. No results found.   ASSESSMENT & PLAN:   32 y.o. very pleasant male with  1. 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 -Cytology done 07/13/2021 shows presence of malignant cells in pancreas and poorly differentiated/high grade neuroendocrine carcinoma.  PLAN: -Discussed lab results from today, 03/12/2022, with the patient. CBC shows slightly decreased hemoglobin of 11.6 and hematocrit of 34.7. CMP shows slightly decreased potassium of 3.4.  -Plan CT scan before our next  visit. Patient notes he did not get a call to schedule CT scan.  -Referral was sent to [facility] for additional opinion, but patient notes he did not get a call from [facility].  FOLLOW-UP: CT chest/abd/pelvis ASAP F/u with referral to Dr Dennison Nancy @ [facility] for 2 opinion RTC with Dr Irene Limbo with labs in 1 month  The total time spent in the appointment was 20 minutes* .  All of the patient's questions were answered with apparent satisfaction. The patient knows to call the clinic with any problems, questions or concerns.   Sullivan Lone MD MS AAHIVMS [facility] Hematology/Oncology Physician [facility]  .*Total Encounter Time as defined by the Centers for Medicare and Medicaid Services includes, in addition to the face-to-face time of a patient visit (documented in the note above) non-face-to-face time: obtaining and reviewing outside history, ordering and reviewing medications, tests or procedures, care coordination (communications with other health care professionals or caregivers) and documentation in the medical record.   I, Cleda Mccreedy, am acting as a Education administrator for Sullivan Lone, MD. .I have reviewed the above documentation for accuracy and completeness, and I agree with the above. [name] MD

## 2022-03-19 ENCOUNTER — Encounter: Payer: Self-pay | Admitting: Hematology

## 2022-03-27 ENCOUNTER — Other Ambulatory Visit: Payer: Self-pay

## 2022-04-04 ENCOUNTER — Ambulatory Visit (HOSPITAL_COMMUNITY)
Admission: RE | Admit: 2022-04-04 | Discharge: 2022-04-04 | Disposition: A | Discharge status: Home / Self Care | Payer: 59 | Source: Ambulatory Visit | Attending: Hematology | Admitting: Hematology

## 2022-04-04 ENCOUNTER — Other Ambulatory Visit: Payer: Self-pay

## 2022-04-04 DIAGNOSIS — C7A1 Malignant poorly differentiated neuroendocrine tumors: Secondary | ICD-10-CM | POA: Insufficient documentation

## 2022-04-04 MED ORDER — SODIUM CHLORIDE (PF) 0.9 % IJ SOLN
INTRAMUSCULAR | Status: AC
  Filled 2022-04-04: qty 50

## 2022-04-04 MED ORDER — HEPARIN SOD (PORK) LOCK FLUSH 100 UNIT/ML IV SOLN
500.0000 [IU] | Freq: Once | INTRAVENOUS | Status: AC
  Administered 2022-04-04: 500 [IU] via INTRAVENOUS

## 2022-04-04 MED ORDER — HEPARIN SOD (PORK) LOCK FLUSH 100 UNIT/ML IV SOLN
INTRAVENOUS | Status: AC
  Filled 2022-04-04: qty 5

## 2022-04-04 MED ORDER — IOHEXOL 300 MG/ML  SOLN
100.0000 mL | Freq: Once | INTRAMUSCULAR | Status: AC | PRN
  Administered 2022-04-04: 100 mL via INTRAVENOUS

## 2022-04-08 ENCOUNTER — Inpatient Hospital Stay: Payer: 59 | Attending: Hematology

## 2022-04-08 ENCOUNTER — Other Ambulatory Visit: Payer: Self-pay

## 2022-04-08 ENCOUNTER — Inpatient Hospital Stay (HOSPITAL_BASED_OUTPATIENT_CLINIC_OR_DEPARTMENT_OTHER): Payer: 59 | Admitting: Hematology

## 2022-04-08 VITALS — BP 135/95 | HR 78 | Temp 97.7°F | Resp 20 | Wt 286.0 lb

## 2022-04-08 DIAGNOSIS — C7A1 Malignant poorly differentiated neuroendocrine tumors: Secondary | ICD-10-CM

## 2022-04-08 DIAGNOSIS — Z5111 Encounter for antineoplastic chemotherapy: Secondary | ICD-10-CM | POA: Insufficient documentation

## 2022-04-08 DIAGNOSIS — C7A8 Other malignant neuroendocrine tumors: Secondary | ICD-10-CM | POA: Diagnosis present

## 2022-04-08 DIAGNOSIS — Z95828 Presence of other vascular implants and grafts: Secondary | ICD-10-CM

## 2022-04-08 DIAGNOSIS — Z7189 Other specified counseling: Secondary | ICD-10-CM | POA: Diagnosis not present

## 2022-04-08 DIAGNOSIS — C7B8 Other secondary neuroendocrine tumors: Secondary | ICD-10-CM | POA: Diagnosis not present

## 2022-04-08 LAB — CBC WITH DIFFERENTIAL (CANCER CENTER ONLY)
Abs Immature Granulocytes: 0.02 10*3/uL (ref 0.00–0.07)
Basophils Absolute: 0 10*3/uL (ref 0.0–0.1)
Basophils Relative: 0 %
Eosinophils Absolute: 0.2 10*3/uL (ref 0.0–0.5)
Eosinophils Relative: 2 %
HCT: 35.9 % — ABNORMAL LOW (ref 39.0–52.0)
Hemoglobin: 12.1 g/dL — ABNORMAL LOW (ref 13.0–17.0)
Immature Granulocytes: 0 %
Lymphocytes Relative: 16 %
Lymphs Abs: 1.1 10*3/uL (ref 0.7–4.0)
MCH: 29.8 pg (ref 26.0–34.0)
MCHC: 33.7 g/dL (ref 30.0–36.0)
MCV: 88.4 fL (ref 80.0–100.0)
Monocytes Absolute: 0.5 10*3/uL (ref 0.1–1.0)
Monocytes Relative: 7 %
Neutro Abs: 4.9 10*3/uL (ref 1.7–7.7)
Neutrophils Relative %: 75 %
Platelet Count: 287 10*3/uL (ref 150–400)
RBC: 4.06 MIL/uL — ABNORMAL LOW (ref 4.22–5.81)
RDW: 14.2 % (ref 11.5–15.5)
WBC Count: 6.6 10*3/uL (ref 4.0–10.5)
nRBC: 0 % (ref 0.0–0.2)

## 2022-04-08 LAB — CMP (CANCER CENTER ONLY)
ALT: 18 U/L (ref 0–44)
AST: 14 U/L — ABNORMAL LOW (ref 15–41)
Albumin: 4.3 g/dL (ref 3.5–5.0)
Alkaline Phosphatase: 75 U/L (ref 38–126)
Anion gap: 8 (ref 5–15)
BUN: 9 mg/dL (ref 6–20)
CO2: 27 mmol/L (ref 22–32)
Calcium: 9.5 mg/dL (ref 8.9–10.3)
Chloride: 104 mmol/L (ref 98–111)
Creatinine: 0.64 mg/dL (ref 0.61–1.24)
GFR, Estimated: >60 mL/min (ref >60)
Glucose, Bld: 101 mg/dL — ABNORMAL HIGH (ref 70–99)
Potassium: 3.4 mmol/L — ABNORMAL LOW (ref 3.5–5.1)
Sodium: 139 mmol/L (ref 135–145)
Total Bilirubin: 0.3 mg/dL (ref 0.3–1.2)
Total Protein: 7.5 g/dL (ref 6.5–8.1)

## 2022-04-08 MED ORDER — SODIUM CHLORIDE 0.9% FLUSH
10.0000 mL | Freq: Once | INTRAVENOUS | Status: AC
  Administered 2022-04-08: 10 mL

## 2022-04-08 MED ORDER — HEPARIN SOD (PORK) LOCK FLUSH 100 UNIT/ML IV SOLN
500.0000 [IU] | Freq: Once | INTRAVENOUS | Status: AC
  Administered 2022-04-08: 500 [IU]

## 2022-04-08 NOTE — Progress Notes (Signed)
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  HISTORY OF PRESENTING ILLNESS:   Leon Fischer. is a wonderful 32 y.o. 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 He presents today accompanied by his mother. He reports He is doing well.  He reports no color change in stool.  He notes that he is eating well but feels food just sits in stomach.  We discussed getting additional scans and labs for further evaluation and treatment which he was agreeable to. We further discussed getting MRI of brain and PET scan which he was agreeable to.  He notes that he is trying to hold it together mentally and is still trying to process his new diagnosis. We discussed the options of psychological or spiritual counseling which he has not decided on at this time. He was advised that may call back if he decides that he would like to pursue either.  We discussed getting a port a cath installed which he was agreeable to.  He recently had a biliary stent put into common bile duct.   No abdominal pain or change in bowel habits. No new or unexpected weight loss. No other new or acute focal symptoms.  We discussed his recent stomach biopsy done 07/13/2021 which sowed gastropathy and minor chronic gastritis with lymphoid aggregate. Negative for H. Pylori.  We discussed recent cytology done 07/13/2021 which showed presence of malignant cells in pancreas and poorly differentiated/high grade neuroendocrine carcinoma.  We discussed upper endoscopy done 07/13/2021 revealed mass in pancreatic head and  some enlarged lymph nodes were seen in the celiac and peripancreatic regions.  Labs done today were reviewed in detail.  INTERVAL HISTORY:  Leon Fischer. Is a 32 y.o. 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   Patient was last seen by me on 03/12/2022 and he was doing well overall.   Patient is accompanied by his mother during this visit. He reports he has been doing well overall without any new medical concerns since our last visit. He denies fever, chills, night sweats, infection issues, fatigue, diarrhea, abdominal pain, chest pain, or leg swelling.   Patient notes he has not been seen by [facility]. He wants to hold off being referred to [facility] for now.   Patient complains of occasional back pain and he takes oxycodone for pain management.   MEDICAL HISTORY:  Past Medical History:  Diagnosis Date   Asthma    Neuroendocrine cancer ([place])     SURGICAL HISTORY: Past Surgical History:  Procedure Laterality Date   BILIARY DILATION  07/13/2021   Procedure: BILIARY DILATION;  Surgeon: Clarene Essex, MD;  Location: [facility];  Service: Gastroenterology;;   BILIARY STENT PLACEMENT N/A 07/13/2021   Procedure: BILIARY STENT PLACEMENT;  Surgeon: Clarene Essex, MD;  Location: [name];  Service: Gastroenterology;  Laterality: N/A;   BILIARY STENT PLACEMENT N/A 11/05/2021   Procedure: BILIARY STENT PLACEMENT;  Surgeon: Clarene Essex, MD;  Location: [name];  Service: Gastroenterology;  Laterality: N/A;   BIOPSY  07/13/2021   Procedure: BIOPSY;  Surgeon: Rush Landmark Telford Nab., MD;  Location: [name];  Service: Gastroenterology;;  CHOLECYSTECTOMY N/A 11/07/2021   Procedure: LAPAROSCOPIC CHOLECYSTECTOMY;  Surgeon: Clovis Riley, MD;  Location: [facility];  Service: General;  Laterality: N/A;   ENDOSCOPIC RETROGRADE CHOLANGIOPANCREATOGRAPHY (ERCP) WITH PROPOFOL N/A 07/13/2021   Procedure:  ENDOSCOPIC RETROGRADE CHOLANGIOPANCREATOGRAPHY (ERCP) WITH PROPOFOL;  Surgeon: Clarene Essex, MD;  Location: [name];  Service: Gastroenterology;  Laterality: N/A;   ERCP N/A 11/05/2021   Procedure: ENDOSCOPIC RETROGRADE CHOLANGIOPANCREATOGRAPHY (ERCP);  Surgeon: Clarene Essex, MD;  Location: [facility];  Service: Gastroenterology;  Laterality: N/A;   ESOPHAGOGASTRODUODENOSCOPY (EGD) WITH PROPOFOL N/A 07/13/2021   Procedure: ESOPHAGOGASTRODUODENOSCOPY (EGD) WITH PROPOFOL;  Surgeon: Rush Landmark Telford Nab., MD;  Location: [name];  Service: Gastroenterology;  Laterality: N/A;   EUS N/A 07/13/2021   Procedure: UPPER ENDOSCOPIC ULTRASOUND (EUS) LINEAR;  Surgeon: Irving Copas., MD;  Location: [name];  Service: Gastroenterology;  Laterality: N/A;   FINE NEEDLE ASPIRATION  07/13/2021   Procedure: FINE NEEDLE ASPIRATION (FNA) LINEAR;  Surgeon: Irving Copas., MD;  Location: [facility];  Service: Gastroenterology;;   IR IMAGING GUIDED PORT INSERTION  07/31/2021   SPHINCTEROTOMY  07/13/2021   Procedure: Joan Mayans;  Surgeon: Clarene Essex, MD;  Location: [name];  Service: Gastroenterology;;    SOCIAL HISTORY: Social History   Socioeconomic History   Marital status: Single    Spouse name: Not on file   Number of children: 0   Years of education: Not on file   Highest education level: Some college, no degree  Occupational History   Not on file  Tobacco Use   Smoking status: Some Days    Types: Cigarettes   Smokeless tobacco: Never  Vaping Use   Vaping Use: Never used  Substance and Sexual Activity   Alcohol use: Yes    Comment: social   Drug use: No   Sexual activity: Never  Other Topics Concern   Not on file  Social History Narrative   Not on file   Social Determinants of Health   Financial Resource Strain: Low Risk  (04/14/2017)   Overall Financial Resource Strain (CARDIA)    Difficulty of Paying Living Expenses: Not hard at all  Food Insecurity:  No Food Insecurity (11/07/2021)   Hunger Vital Sign    Worried About Running Out of Food in the Last Year: Never true    Ran Out of Food in the Last Year: Never true  Transportation Needs: No Transportation Needs (11/07/2021)   PRAPARE - Hydrologist (Medical): No    Lack of Transportation (Non-Medical): No  Physical Activity: Inactive (04/14/2017)   Exercise Vital Sign    Days of Exercise per Week: 0 days    Minutes of Exercise per Session: 0 min  Stress: Stress Concern Present (04/14/2017)   [place]    Feeling of Stress : Rather much  Social Connections: Moderately Isolated (04/14/2017)   Social Connection and Isolation Panel [NHANES]    Frequency of Communication with Friends and Family: More than three times a week    Frequency of Social Gatherings with Friends and Family: More than three times a week    Attends Religious Services: Never    Marine scientist or Organizations: No    Attends Archivist Meetings: Never    Marital Status: Never married  Intimate Partner Violence: Not At Risk (11/07/2021)   Humiliation, Afraid, Rape, and Kick questionnaire    Fear of Current or Ex-Partner: No    Emotionally Abused: No  Physically Abused: No    Sexually Abused: No    FAMILY HISTORY: Family History  Problem Relation Age of Onset   Drug abuse Father    ALLERGIES:  is allergic to shellfish allergy and other.  MEDICATIONS:  Current Outpatient Medications  Medication Sig Dispense Refill   acetaminophen (TYLENOL) 500 MG tablet Take 1,000 mg by mouth every 6 (six) hours as needed for mild pain.     albuterol (VENTOLIN HFA) 108 (90 Base) MCG/ACT inhaler INHALE 2 PUFFS BY MOUTH EVERY 4 HOURS AS NEEDED FOR WHEEZING FOR SHORTNESS OF BREATH 18 g 0   diclofenac Sodium (VOLTAREN) 1 % GEL Apply 4 g topically 4 (four) times daily. (Patient taking differently: Apply 4 g topically 4  (four) times daily as needed (pain).) 100 g 0   methocarbamol (ROBAXIN) 500 MG tablet Take 1 tablet (500 mg total) by mouth 2 (two) times daily. 20 tablet 0   oxyCODONE (OXY IR/ROXICODONE) 5 MG immediate release tablet Take 1 tablet (5 mg total) by mouth every 6 (six) hours as needed for severe pain or moderate pain. 20 tablet 0   polyethylene glycol (MIRALAX / GLYCOLAX) 17 g packet Take 17 g by mouth daily. (Patient taking differently: Take 17 g by mouth daily as needed for mild constipation or moderate constipation.) 14 each 0   senna-docusate (SENOKOT-S) 8.6-50 MG tablet Take 2 tablets by mouth 2 (two) times daily. (Patient taking differently: Take 2 tablets by mouth daily as needed for mild constipation or moderate constipation.) 30 tablet 0   No current facility-administered medications for this visit.    REVIEW OF SYSTEMS:    10 Point review of Systems was done is negative except as noted above.  PHYSICAL EXAMINATION: ECOG PERFORMANCE STATUS: 1 - Symptomatic but completely ambulatory  . Vitals:   04/08/22 1408  BP: (!) 135/95  Pulse: 78  Resp: 20  Temp: 97.7 F (36.5 C)  SpO2: 100%   NAD GENERAL:alert, in no acute distress and comfortable SKIN: no acute rashes, no significant lesions EYES: conjunctiva are pink and non-injected, sclera anicteric NECK: supple, no JVD LYMPH:  no palpable lymphadenopathy in the cervical, axillary or inguinal regions LUNGS: clear to auscultation b/l with normal respiratory effort HEART: regular rate & rhythm ABDOMEN:  normoactive bowel sounds , non tender, not distended. Extremity: no pedal edema PSYCH: alert & oriented x 3 with fluent speech NEURO: no focal motor/sensory deficits  LABORATORY DATA:  I have reviewed the data as listed .    Latest Ref Rng & Units 04/08/2022    1:40 PM 03/12/2022    1:01 PM 01/16/2022    2:36 PM  CBC  WBC 4.0 - 10.5 K/uL 6.6  7.8  10.9   Hemoglobin 13.0 - 17.0 g/dL 12.1  11.6  11.5   Hematocrit 39.0 - 52.0  % 35.9  34.7  34.5   Platelets 150 - 400 K/uL 287  235  555    .    Latest Ref Rng & Units 04/08/2022    1:40 PM 03/12/2022    1:01 PM 01/16/2022    2:36 PM  CMP  Glucose 70 - 99 mg/dL 101  89  142   BUN 6 - 20 mg/dL '9  6  12   '$ Creatinine 0.61 - 1.24 mg/dL 0.64  0.68  0.90   Sodium 135 - 145 mmol/L 139  139  139   Potassium 3.5 - 5.1 mmol/L 3.4  3.4  3.7   Chloride 98 - 111  mmol/L 104  104  107   CO2 22 - 32 mmol/L '27  27  26   '$ Calcium 8.9 - 10.3 mg/dL 9.5  9.2  9.4   Total Protein 6.5 - 8.1 g/dL 7.5  6.8  7.9   Total Bilirubin 0.3 - 1.2 mg/dL 0.3  0.5  0.3   Alkaline Phos 38 - 126 U/L 75  68  131   AST 15 - 41 U/L '14  15  25   '$ ALT 0 - 44 U/L 18  13  96       Cytology done 07/13/2021 revealed "FINAL MICROSCOPIC DIAGNOSIS:  A. PANCREAS, HEAD, FINE NEEDLE ASPIRATION:  - Malignant cells present  - Poorly differentiated/high-grade neuroendocrine carcinoma (see  comment)   B. CBD STRICTURE, BRUSHING:  - Atypical cells suspicious for tumor   C. BILIARY DILATION, BALLOON, REMOVAL:  - Atypical cells suspicious for tumor "  Surgical pathology done 07/13/2021 revealed "FINAL MICROSCOPIC DIAGNOSIS:   A. STOMACH, BIOPSY:  Reactive gastropathy and minimal chronic gastritis with lymphoid  aggregate  Negative for H. pylori, intestinal metaplasia, dysplasia and carcinoma"  RADIOGRAPHIC STUDIES: I have personally reviewed the radiological images as listed and agreed with the findings in the report. CT CHEST ABDOMEN PELVIS W CONTRAST  Result Date: 04/04/2022 CLINICAL DATA:  High-grade metastatic pancreatic neuroendocrine tumor. * Tracking Code: BO * EXAM: CT CHEST, ABDOMEN, AND PELVIS WITH CONTRAST TECHNIQUE: Multidetector CT imaging of the chest, abdomen and pelvis was performed following the standard protocol during bolus administration of intravenous contrast. RADIATION DOSE REDUCTION: This exam was performed according to the departmental dose-optimization program which includes  automated exposure control, adjustment of the mA and/or kV according to patient size and/or use of iterative reconstruction technique. CONTRAST:  118m OMNIPAQUE IOHEXOL 300 MG/ML  SOLN COMPARISON:  CT 11/12/2021 FINDINGS: CT CHEST FINDINGS Cardiovascular: Port in the anterior chest wall with tip in distal SVC. Mediastinum/Nodes: No axillary or supraclavicular adenopathy. No mediastinal or hilar adenopathy. No pericardial fluid. Esophagus normal. Lungs/Pleura: No suspicious pulmonary nodules. Normal pleural. Airways normal. Musculoskeletal: No aggressive osseous lesion Bilateral symmetric gynecomastia (mild) CT ABDOMEN AND PELVIS FINDINGS Hepatobiliary: Hepatic lesions have increased in size. Example subcapsular lesion in the anterior RIGHT hepatic lobe measures 27 mm compared to 16 mm (image 45/2). Lesion lateral segment of the LEFT hepatic lobe measures 24 mm (45/2 compared to 14 mm. Lesion in the subcapsular lateral RIGHT hepatic lobe measures 29 mm compared to 14 mm. Central hepatic metastasis are more prominent or new (image 52/2) No intrahepatic biliary duct dilatation. Pneumobilia in LEFT hepatic lobe. Biliary stent in place postcholecystectomy. Small a gas in the remnant cystic duct. Pancreas: Stent through the pancreatic head. Pancreas body and tail are slightly atrophic. Mild unchanged ductal dilatation. Mass lesion in the pancreatic head about the stent difficult to define but apparently increased. Example soft tissue at the level of a calcification measures 4 cm (image 60/2) compared to 3.4 cm. Retroperitoneal adenopathy deep to the IVC appears slightly more prominent. Dominant node measuring 15 mm (62/2 compares to 15 mm unchanged. Lymph node dorsal to the IVC measuring 9 mm compares to 4 mm increased in size. Lymph node adjacent to the RIGHT renal hilum measures 21 mm compared to 14 mm. Spleen: Normal spleen Adrenals/urinary tract: Adrenal glands and kidneys are normal. The ureters and bladder normal.  Stomach/Bowel: Stomach is normal. Poor definition of tissue planes along the second portion duodenum. No small bowel obstruction. Appendix normal. Colon rectosigmoid colon normal. Vascular/Lymphatic: Abdominal  aorta normal caliber. No upper abdominal lymphadenopathy increased described the pancreatic section. Reproductive: Prostate unremarkable Other: No free fluid. Musculoskeletal: No aggressive osseous lesion. IMPRESSION: 1. No evidence of thoracic metastasis. 2. Interval increase in size and prominence multifocal hepatic metastasis. 3. Interval increase in peripancreatic and pericaval retroperitoneal lymphadenopathy. 4. No biliary duct dilatation with stent placed. 5. Interval subtle increase in size of mass in the pancreatic head. Mild pancreatic duct dilatation similar prior. Electronically Signed   By: Suzy Bouchard M.D.   On: 04/04/2022 17:23     ASSESSMENT & PLAN:   32 y.o. very pleasant male with  1. 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 -Cytology done 07/13/2021 shows presence of malignant cells in pancreas and poorly differentiated/high grade neuroendocrine carcinoma.  PLAN: -Discussed lab results from today, 04/08/2022, with the patient. CBC shows that the patient is anemic with hemoglobin of 12.1 and hematocrit of 35.9. CMP is stable.  -Discussed CT Chest Abdomen Pelvis results from 04/04/2022 with the patient. Showed: No evidence of thoracic metastasis. Interval increase in size and prominence multifocal hepatic metastasis. Interval increase in peripancreatic and pericaval retroperitoneal lymphadenopathy. No biliary duct dilatation with stent placed. Interval subtle increase in size of mass in the pancreatic head. Mild pancreatic duct dilatation similar prior. -Patient wants to hold off from being referred to [facility] for now. He has not been seen by [facility] since our last visit.  -We did  not see any genetic mutations during mutation testing in the past.  -Discussed the next option of 1. Folfox chemotherapy treatment, 2. getting second opinion from [place] or from other place, or 3. hospice.  -Patient and his mother would like to wait and decide on it later. Before our next visit. She later called and confirmed that patient wants to proceed with FOLFOX chemotherapy -- orders placed. -Educated the patient and his mother on Folfox treatment and risks of it including neuropathy, diarrhea, mouth sores, etc.  -Answered all of patient's questions.   -Plan on dotatate PET scan to help [place] determine the treatment options.  -Will refill oxycodone for his back pain.   FOLLOW-UP: Recommended 2nd line FOLFOX chemotherapy -- patient to call us back with final decision regarding his choice  The total time spent in the appointment was 40 minutes* .  All of the patient's questions were answered with apparent satisfaction. The patient knows to call the clinic with any problems, questions or concerns.   Sullivan Lone MD MS AAHIVMS [facility] Hematology/Oncology Physician [facility]  .*Total Encounter Time as defined by the Centers for Medicare and Medicaid Services includes, in addition to the face-to-face time of a patient visit (documented in the note above) non-face-to-face time: obtaining and reviewing outside history, ordering and reviewing medications, tests or procedures, care coordination (communications with other health care professionals or caregivers) and documentation in the medical record.   I, Cleda Mccreedy, am acting as a Education administrator for Sullivan Lone, MD. .I have reviewed the above documentation for accuracy and completeness, and I agree with the above. [name] MD

## 2022-04-14 ENCOUNTER — Encounter: Payer: Self-pay | Admitting: Hematology

## 2022-04-14 MED ORDER — LIDOCAINE-PRILOCAINE 2.5-2.5 % EX CREA: TOPICAL_CREAM | CUTANEOUS | 3 refills | Status: DC

## 2022-04-14 MED ORDER — PROCHLORPERAZINE MALEATE 10 MG PO TABS: 10.0000 mg | ORAL_TABLET | Freq: Four times a day (QID) | ORAL | 1 refills | Status: DC | PRN

## 2022-04-14 MED ORDER — DEXAMETHASONE 4 MG PO TABS: 8.0000 mg | ORAL_TABLET | Freq: Every day | ORAL | 1 refills | Status: DC

## 2022-04-14 MED ORDER — ONDANSETRON HCL 8 MG PO TABS: 8.0000 mg | ORAL_TABLET | Freq: Three times a day (TID) | ORAL | 1 refills | Status: DC | PRN

## 2022-04-14 NOTE — Progress Notes (Signed)
DISCONTINUE ON PATHWAY REGIMEN - Neuroendocrine     A cycle is every 21 days:     Carboplatin      Etoposide   **Always confirm dose/schedule in your pharmacy ordering system**  REASON: Disease Progression PRIOR TREATMENT: [id number]: Carboplatin AUC=5 Day 1 + Etoposide 100 mg/m2 Days 1 - 3 q21 Days x 4 - 6 Cycles TREATMENT RESPONSE: Partial Response (PR)  START OFF PATHWAY REGIMEN - Neuroendocrine   [id number] (Leucovorin IV D1 + Fluorouracil IV D1/CIV D1,2 + Oxaliplatin IV D1) q14 Days:   A cycle is every 14 days:     Oxaliplatin      Leucovorin      Fluorouracil      Fluorouracil   **Always confirm dose/schedule in your pharmacy ordering system**  Patient Characteristics: Pancreas, Poorly-differentiated, Second Line and Beyond Tumor Location: Pancreas Line of therapy: Second Line and Beyond  Intent of Therapy: Non-Curative / Palliative Intent, Discussed with Patient

## 2022-04-15 ENCOUNTER — Telehealth: Payer: Self-pay | Admitting: Hematology

## 2022-04-15 NOTE — Telephone Encounter (Signed)
Called patient to confirm start date/times for treatment. Left voicemail with new appointment information and contact details.

## 2022-04-17 ENCOUNTER — Emergency Department (HOSPITAL_COMMUNITY)
Admission: EM | Admit: 2022-04-17 | Discharge: 2022-04-18 | Disposition: A | Discharge status: Home / Self Care | Payer: 59 | Attending: Emergency Medicine | Admitting: Emergency Medicine

## 2022-04-17 ENCOUNTER — Encounter: Payer: Self-pay | Admitting: Hematology

## 2022-04-17 ENCOUNTER — Other Ambulatory Visit: Payer: Self-pay

## 2022-04-17 DIAGNOSIS — R112 Nausea with vomiting, unspecified: Secondary | ICD-10-CM

## 2022-04-17 DIAGNOSIS — Z1152 Encounter for screening for COVID-19: Secondary | ICD-10-CM | POA: Insufficient documentation

## 2022-04-17 DIAGNOSIS — Z5111 Encounter for antineoplastic chemotherapy: Secondary | ICD-10-CM | POA: Diagnosis not present

## 2022-04-17 DIAGNOSIS — Z8589 Personal history of malignant neoplasm of other organs and systems: Secondary | ICD-10-CM | POA: Diagnosis not present

## 2022-04-17 DIAGNOSIS — R5383 Other fatigue: Secondary | ICD-10-CM | POA: Diagnosis not present

## 2022-04-17 LAB — URINALYSIS, ROUTINE W REFLEX MICROSCOPIC
Bilirubin Urine: NEGATIVE
Glucose, UA: NEGATIVE mg/dL
Hgb urine dipstick: NEGATIVE
Ketones, ur: 5 mg/dL — AB
Leukocytes,Ua: NEGATIVE
Nitrite: NEGATIVE
Protein, ur: 30 mg/dL — AB
Specific Gravity, Urine: 1.023 (ref 1.005–1.030)
pH: 5 (ref 5.0–8.0)

## 2022-04-17 LAB — COMPREHENSIVE METABOLIC PANEL
ALT: 41 U/L (ref 0–44)
AST: 27 U/L (ref 15–41)
Albumin: 4.4 g/dL (ref 3.5–5.0)
Alkaline Phosphatase: 87 U/L (ref 38–126)
Anion gap: 8 (ref 5–15)
BUN: 9 mg/dL (ref 6–20)
CO2: 27 mmol/L (ref 22–32)
Calcium: 9.2 mg/dL (ref 8.9–10.3)
Chloride: 99 mmol/L (ref 98–111)
Creatinine, Ser: 0.58 mg/dL — ABNORMAL LOW (ref 0.61–1.24)
GFR, Estimated: >60 mL/min (ref >60)
Glucose, Bld: 119 mg/dL — ABNORMAL HIGH (ref 70–99)
Potassium: 3.8 mmol/L (ref 3.5–5.1)
Sodium: 134 mmol/L — ABNORMAL LOW (ref 135–145)
Total Bilirubin: 0.5 mg/dL (ref 0.3–1.2)
Total Protein: 8.5 g/dL — ABNORMAL HIGH (ref 6.5–8.1)

## 2022-04-17 LAB — CBC WITH DIFFERENTIAL/PLATELET
Abs Immature Granulocytes: 0.01 10*3/uL (ref 0.00–0.07)
Basophils Absolute: 0 10*3/uL (ref 0.0–0.1)
Basophils Relative: 0 %
Eosinophils Absolute: 0.2 10*3/uL (ref 0.0–0.5)
Eosinophils Relative: 4 %
HCT: 38.9 % — ABNORMAL LOW (ref 39.0–52.0)
Hemoglobin: 12.5 g/dL — ABNORMAL LOW (ref 13.0–17.0)
Immature Granulocytes: 0 %
Lymphocytes Relative: 18 %
Lymphs Abs: 1.1 10*3/uL (ref 0.7–4.0)
MCH: 28.7 pg (ref 26.0–34.0)
MCHC: 32.1 g/dL (ref 30.0–36.0)
MCV: 89.2 fL (ref 80.0–100.0)
Monocytes Absolute: 0.8 10*3/uL (ref 0.1–1.0)
Monocytes Relative: 13 %
Neutro Abs: 3.8 10*3/uL (ref 1.7–7.7)
Neutrophils Relative %: 65 %
Platelets: 225 10*3/uL (ref 150–400)
RBC: 4.36 MIL/uL (ref 4.22–5.81)
RDW: 14.4 % (ref 11.5–15.5)
WBC: 5.9 10*3/uL (ref 4.0–10.5)
nRBC: 0 % (ref 0.0–0.2)

## 2022-04-17 LAB — RESP PANEL BY RT-PCR (RSV, FLU A&B, COVID)  RVPGX2
Influenza A by PCR: NEGATIVE
Influenza B by PCR: NEGATIVE
Resp Syncytial Virus by PCR: NEGATIVE
SARS Coronavirus 2 by RT PCR: NEGATIVE

## 2022-04-17 LAB — LIPASE, BLOOD: Lipase: 29 U/L (ref 11–51)

## 2022-04-17 NOTE — ED Triage Notes (Signed)
Family reports lethargic, lower left sided back pain, n/v, 10 lbs weight loss in 1 week.  Hx Neuroendocrine cancer, starting chemo next week.  Denies cp/sob Tylenol @ home w/o relief

## 2022-04-17 NOTE — ED Provider Triage Note (Signed)
Emergency Medicine Provider Triage Evaluation Note  Katelyn Braxton. , a 32 y.o. male  was evaluated in triage.  Pt complains of lethargic, emesis.  Patient is starting chemo next week for neuroendocrine cancer.  Denies chest pain, shortness of breath.  Tried Tylenol at home without relief of symptoms.  Patient has associated left lower back pain, nausea, vomiting..   Review of Systems  Positive:  Negative:   Physical Exam  BP 119/74   Pulse (!) 105   Temp 98.4 F (36.9 C)   Resp (!) 23   Wt 124.7 kg   SpO2 97%   BMI 37.30 kg/m  Gen:   Awake, no distress   Resp:  Normal effort  MSK:   Moves extremities without difficulty  Other:  No abdominal TTP. No chest wall TTP.   Medical Decision Making  Medically screening exam initiated at 5:58 PM.  Appropriate orders placed.  [name]. was informed that the remainder of the evaluation will be completed by another provider, this initial triage assessment does not replace that evaluation, and the importance of remaining in the ED until their evaluation is complete.  Work-up initiated   Jamy Whyte A, PA-C 04/17/22 1804

## 2022-04-17 NOTE — Progress Notes (Signed)
Pharmacist Chemotherapy Monitoring - Initial Assessment    Anticipated start date: 04/24/22   The following has been reviewed per standard work regarding the patient's treatment regimen: The patient's diagnosis, treatment plan and drug doses, and organ/hematologic function Lab orders and baseline tests specific to treatment regimen  The treatment plan start date, drug sequencing, and pre-medications Prior authorization status  Patient's documented medication list, including drug-drug interaction screen and prescriptions for anti-emetics and supportive care specific to the treatment regimen The drug concentrations, fluid compatibility, administration routes, and timing of the medications to be used The patient's access for treatment and lifetime cumulative dose history, if applicable  The patient's medication allergies and previous infusion related reactions, if applicable   Changes made to treatment plan:  N/A  Follow up needed:  N/A   Kennith Center, Pharm.D., CPP 04/17/2022'@12'$ :53 PM

## 2022-04-18 ENCOUNTER — Emergency Department (HOSPITAL_COMMUNITY): Payer: 59

## 2022-04-18 DIAGNOSIS — R112 Nausea with vomiting, unspecified: Secondary | ICD-10-CM | POA: Diagnosis not present

## 2022-04-18 MED ORDER — SODIUM CHLORIDE 0.9 % IV BOLUS
1000.0000 mL | Freq: Once | INTRAVENOUS | Status: AC
  Administered 2022-04-18: 1000 mL via INTRAVENOUS

## 2022-04-18 MED ORDER — HYDROMORPHONE HCL 1 MG/ML IJ SOLN
1.0000 mg | Freq: Once | INTRAMUSCULAR | Status: AC
  Administered 2022-04-18: 1 mg via INTRAVENOUS
  Filled 2022-04-18: qty 1

## 2022-04-18 NOTE — ED Provider Notes (Signed)
EMERGENCY DEPARTMENT AT [facility] Provider Note   CSN: [id number] Arrival date & time: 04/17/22  1741     History  Chief Complaint  Patient presents with   Emesis    Leon Fischer. is a 32 y.o. male.  HPI   Patient with medical history including high-grade neuroendocrine carcinoma, status post chemotherapy in November, presenting with complaints of nausea and vomiting.  Patient states that he has been having issues with nausea vomiting since January, states that he has a poor appetite, denies any bloody emesis or coffee-ground emesis, still passing gas having bowel movements, he does note that they are less frequent as he is not been eating very much, denies any urinary symptoms, states that he will occasionally have abdominal pain in the lower abdomen, going on for last 4 days, states they come and go, currently has none at this time, states he does have back pain but this is chronic for him no new changes, denies any paresthesias or weakness moving down his leg no urinary or bowel incontinency's.  Patient states that he has been lethargic, which she describes as being more tired, difficulty waking up from sleeping, and having daytime tiredness, denies any headaches change in vision paresthesias weakness upper lower extremities no chest pain no shortness of breath no pleuritic chest pain.  Mother is at bedside and she is able to validate the story, states that he just seems more tired, and will have pain from his cancer.  They are currently not undergoing their second round of chemo at this time, supposed to start next Monday.  Home Medications Prior to Admission medications   Medication Sig Start Date End Date Taking? Authorizing Provider  acetaminophen (TYLENOL) 500 MG tablet Take 1,000 mg by mouth every 6 (six) hours as needed for mild pain.    [provider]  albuterol (VENTOLIN HFA) 108 (90 Base) MCG/ACT inhaler INHALE 2 PUFFS BY MOUTH  EVERY 4 HOURS AS NEEDED FOR WHEEZING FOR SHORTNESS OF BREATH 11/19/21   [name], MD  dexamethasone (DECADRON) 4 MG tablet Take 2 tablets (8 mg total) by mouth daily. Start the day after chemotherapy for 2 days. Take with food. 04/14/22   [name], MD  diclofenac Sodium (VOLTAREN) 1 % GEL Apply 4 g topically 4 (four) times daily. Patient taking differently: Apply 4 g topically 4 (four) times daily as needed (pain). 11/12/21   Deno Etienne, DO  lidocaine-prilocaine (EMLA) cream Apply to affected area once 04/14/22   [name], MD  methocarbamol (ROBAXIN) 500 MG tablet Take 1 tablet (500 mg total) by mouth 2 (two) times daily. 02/02/22   Tacy Learn, PA-C  ondansetron (ZOFRAN) 8 MG tablet Take 1 tablet (8 mg total) by mouth every 8 (eight) hours as needed for nausea or vomiting. Start on the third day after chemotherapy. 04/14/22   [name], MD  oxyCODONE (OXY IR/ROXICODONE) 5 MG immediate release tablet Take 1 tablet (5 mg total) by mouth every 6 (six) hours as needed for severe pain or moderate pain. 11/27/21   [name], MD  polyethylene glycol (MIRALAX / GLYCOLAX) 17 g packet Take 17 g by mouth daily. Patient taking differently: Take 17 g by mouth daily as needed for mild constipation or moderate constipation. 07/05/21   Regalado, Belkys A, MD  prochlorperazine (COMPAZINE) 10 MG tablet Take 1 tablet (10 mg total) by mouth every 6 (six) hours as needed for nausea or vomiting. 04/14/22  [name], MD  senna-docusate (SENOKOT-S) 8.6-50 MG tablet Take 2 tablets by mouth 2 (two) times daily. Patient taking differently: Take 2 tablets by mouth daily as needed for mild constipation or moderate constipation. 07/05/21   Regalado, Cassie Freer, MD      Allergies    Shellfish allergy and Other    Review of Systems   Review of Systems  Constitutional:  Negative for chills and fever.  Respiratory:  Negative for shortness of breath.    Cardiovascular:  Negative for chest pain.  Gastrointestinal:  Negative for abdominal pain.  Neurological:  Negative for headaches.    Physical Exam Updated Vital Signs BP 117/73   Pulse 92   Temp 98.9 F (37.2 C)   Resp 18   Wt 124.7 kg   SpO2 96%   BMI 37.30 kg/m  Physical Exam Vitals and nursing note reviewed.  Constitutional:      General: He is not in acute distress.    Appearance: He is not ill-appearing.  HENT:     Head: Normocephalic and atraumatic.     Nose: No congestion.  Eyes:     Extraocular Movements: Extraocular movements intact.     Conjunctiva/sclera: Conjunctivae normal.     Pupils: Pupils are equal, round, and reactive to light.  Cardiovascular:     Rate and Rhythm: Normal rate and regular rhythm.     Pulses: Normal pulses.     Heart sounds: No murmur heard.    No friction rub. No gallop.  Pulmonary:     Effort: No respiratory distress.     Breath sounds: No wheezing, rhonchi or rales.  Abdominal:     Palpations: Abdomen is soft.     Tenderness: There is no abdominal tenderness. There is no right CVA tenderness or left CVA tenderness.     Comments: Abdomen nondistended, soft, nontender during my examination.  Musculoskeletal:     Comments: Patient is moving his upper and lower extremities out difficulty, 5-5 strength.  Skin:    General: Skin is warm and dry.  Neurological:     Mental Status: He is alert.     GCS: GCS eye subscore is 4. GCS verbal subscore is 5. GCS motor subscore is 6.     Cranial Nerves: Cranial nerves 2-12 are intact.     Motor: No weakness.     Coordination: Coordination is intact. Romberg sign negative. Finger-Nose-Finger Test normal.     Comments: No facial asymmetry no difficulty with word finding, following simple commands, no unilateral weakness present.  Psychiatric:        Mood and Affect: Mood normal.     ED Results / Procedures / Treatments   Labs (all labs ordered are listed, but only abnormal results are  displayed) Labs Reviewed  CBC WITH DIFFERENTIAL/PLATELET - Abnormal; Notable for the following components:      Result Value   Hemoglobin 12.5 (*)    HCT 38.9 (*)    All other components within normal limits  COMPREHENSIVE METABOLIC PANEL - Abnormal; Notable for the following components:   Sodium 134 (*)    Glucose, Bld 119 (*)    Creatinine, Ser 0.58 (*)    Total Protein 8.5 (*)    All other components within normal limits  URINALYSIS, ROUTINE W REFLEX MICROSCOPIC - Abnormal; Notable for the following components:   Ketones, ur 5 (*)    Protein, ur 30 (*)    Bacteria, UA RARE (*)    All other components  within normal limits  RESP PANEL BY RT-PCR (RSV, FLU A&B, COVID)  RVPGX2  LIPASE, BLOOD    EKG None  Radiology CT Head Wo Contrast  Result Date: 04/18/2022 CLINICAL DATA:  Altered mental status EXAM: CT HEAD WITHOUT CONTRAST TECHNIQUE: Contiguous axial images were obtained from the base of the skull through the vertex without intravenous contrast. RADIATION DOSE REDUCTION: This exam was performed according to the departmental dose-optimization program which includes automated exposure control, adjustment of the mA and/or kV according to patient size and/or use of iterative reconstruction technique. COMPARISON:  01/14/2019 FINDINGS: Brain: There is no mass, hemorrhage or extra-axial collection. The size and configuration of the ventricles and extra-axial CSF spaces are normal. The brain parenchyma is normal, without acute or chronic infarction. Vascular: No abnormal hyperdensity of the major intracranial arteries or dural venous sinuses. No intracranial atherosclerosis. Skull: The visualized skull base, calvarium and extracranial soft tissues are normal. Sinuses/Orbits: No fluid levels or advanced mucosal thickening of the visualized paranasal sinuses. No mastoid or middle ear effusion. The orbits are normal. IMPRESSION: Normal head CT. Electronically Signed   By: Ulyses Jarred M.D.   On:  04/18/2022 02:40    Procedures Procedures    Medications Ordered in ED Medications  HYDROmorphone (DILAUDID) injection 1 mg (1 mg Intravenous Given 04/18/22 0204)  sodium chloride 0.9 % bolus 1,000 mL (1,000 mLs Intravenous New Bag/Given 04/18/22 0204)    ED Course/ Medical Decision Making/ A&P                             Medical Decision Making Amount and/or Complexity of Data Reviewed Radiology: ordered.  Risk Prescription drug management.   This patient presents to the ED for concern of nausea vomiting, this involves an extensive number of treatment options, and is a complaint that carries with it a high risk of complications and morbidity.  The differential diagnosis includes bowel obstruction, diverticulitis, worsening metastatic disease    Additional history obtained:  Additional history obtained from mother at bedside External records from outside source obtained and reviewed including oncology   Co morbidities that complicate the patient evaluation  Metastatic neuroendocrine carcinoma  Social Determinants of Health:  N/A    Lab Tests:  I Ordered, and personally interpreted labs.  The pertinent results include: CBC shows no significant hemoglobin 12.5, CMP reveals sodium 134, glucose 119, creatinine 0.58, total protein 8.5, UA unremarkable, lipase unremarkable, Rester panel negative   Imaging Studies ordered:  I ordered imaging studies including CT head I independently visualized and interpreted imaging which showed negative for acute findings I agree with the radiologist interpretation   Cardiac Monitoring:  The patient was maintained on a cardiac monitor.  I personally viewed and interpreted the cardiac monitored which showed an underlying rhythm of: N/A   Medicines ordered and prescription drug management:  I ordered medication including fluids, pain medication I have reviewed the patients home medicines and have made adjustments as  needed  Critical Interventions:  N/A   Reevaluation:  Presents for concerns of being more fatigued, intermittent abdominal pain, he had a benign physical exam, lab work is unremarkable, reviewed patient's chart recently had CT scan of the abdomen on 29th February, no evidence of thoracic metastases, slightly increased size of the hepatic metastasis, interval increase in peripancreatic lymphadenopathy, as well as increasing size of the head of the pancreatic head, his abdomen was soft nontender my exam, his lab works unremarkable, will defer  on CT imaging, family was also agreed this plan, will further evaluate of CT head to rule out no new malignancy seen in the head.  Reassessed the patient, resting comfortably, having no complaints, has had no nausea or vomiting since being in the ED, both patient and mother in agreement discharge at this time.   Consultations Obtained:  N/a    Test Considered:  CT AP-deferred as he has a nonsurgical abdomen, no nausea no vomiting, will do signs reassuring lab work is also reassuring    Rule out Suspicion for new metastatic mass in the brain and/or intracranial bleed is low at this time CT imaging negative.  I doubt CVA has no focal deficit present my exam.  I doubt malignancy along the spine causing stenosis of the spinal cord as he has no paresthesia or weakness the upper and/or lower extremities, no urinary or bowel incontinency's.  low suspicion for lower lobe pneumonia as lung sounds are clear bilaterally, will defer imaging at this time.  I have low suspicion for liver or gallbladder abnormality as she has no right upper quadrant tenderness, liver enzymes, alk phos, T bili all within normal limits.  Low suspicion for pancreatitis as lipase is within normal limits.  Low suspicion for ruptured stomach ulcer as she has no peritoneal sign present on exam.  Low suspicion for bowel obstruction as abdomen is nondistended normal bowel sounds, so passing  gas and having normal bowel movements.  Low suspicion for complicated diverticulitis as she is nontoxic-appearing, vital signs reassuring no leukocytosis present.  Low suspicion for appendicitis as she has no right lower quadrant tenderness, vital signs reassuring.  I have low suspicion for intra-abdominal infection as she has low risk factors, vital signs reassuring, no leukocytosis, will defer imaging at this time.    Dispostion and problem list  After consideration of the diagnostic results and the patients response to treatment, I feel that the patent would benefit from discharge.  Nausea vomiting-acute on chronic, possibly from malignancy, currently has antiemetics at home, lab follow-up with his oncologist for further evaluation Fatigue-unclear etiology but I suspect multifactorial, working night shift not getting much sleep as well as ongoing malignancy.   .         Final Clinical Impression(s) / ED Diagnoses Final diagnoses:  Nausea and vomiting, unspecified vomiting type  Other fatigue    Rx / DC Orders ED Discharge Orders     None         Marcello Fennel, PA-C 04/18/22 0340    Molpus, Jenny Reichmann, MD 04/18/22 [phone]

## 2022-04-18 NOTE — Discharge Instructions (Signed)
Lab work imaging was reassuring, please continue all home medications.  Follow-up with your oncologist for further evaluation  Come back to the emergency department if you develop chest pain, shortness of breath, severe abdominal pain, uncontrolled nausea, vomiting, diarrhea.

## 2022-04-23 MED FILL — Dexamethasone Sodium Phosphate Inj 100 MG/10ML: INTRAMUSCULAR | Qty: 1 | Status: AC

## 2022-04-24 ENCOUNTER — Other Ambulatory Visit: Payer: Self-pay

## 2022-04-24 ENCOUNTER — Inpatient Hospital Stay: Payer: 59

## 2022-04-24 VITALS — BP 122/74 | HR 98 | Temp 98.0°F | Resp 18 | Wt 271.2 lb

## 2022-04-24 DIAGNOSIS — C7A1 Malignant poorly differentiated neuroendocrine tumors: Secondary | ICD-10-CM

## 2022-04-24 DIAGNOSIS — Z7189 Other specified counseling: Secondary | ICD-10-CM

## 2022-04-24 DIAGNOSIS — Z5111 Encounter for antineoplastic chemotherapy: Secondary | ICD-10-CM | POA: Diagnosis not present

## 2022-04-24 DIAGNOSIS — Z95828 Presence of other vascular implants and grafts: Secondary | ICD-10-CM

## 2022-04-24 LAB — CMP (CANCER CENTER ONLY)
ALT: 164 U/L — ABNORMAL HIGH (ref 0–44)
AST: 42 U/L — ABNORMAL HIGH (ref 15–41)
Albumin: 4.3 g/dL (ref 3.5–5.0)
Alkaline Phosphatase: 284 U/L — ABNORMAL HIGH (ref 38–126)
Anion gap: 9 (ref 5–15)
BUN: 6 mg/dL (ref 6–20)
CO2: 29 mmol/L (ref 22–32)
Calcium: 9.8 mg/dL (ref 8.9–10.3)
Chloride: 98 mmol/L (ref 98–111)
Creatinine: 0.62 mg/dL (ref 0.61–1.24)
GFR, Estimated: >60 mL/min (ref >60)
Glucose, Bld: 103 mg/dL — ABNORMAL HIGH (ref 70–99)
Potassium: 3.5 mmol/L (ref 3.5–5.1)
Sodium: 136 mmol/L (ref 135–145)
Total Bilirubin: 0.6 mg/dL (ref 0.3–1.2)
Total Protein: 7.9 g/dL (ref 6.5–8.1)

## 2022-04-24 LAB — CBC WITH DIFFERENTIAL (CANCER CENTER ONLY)
Abs Immature Granulocytes: 0.02 10*3/uL (ref 0.00–0.07)
Basophils Absolute: 0 10*3/uL (ref 0.0–0.1)
Basophils Relative: 0 %
Eosinophils Absolute: 0.2 10*3/uL (ref 0.0–0.5)
Eosinophils Relative: 2 %
HCT: 36.3 % — ABNORMAL LOW (ref 39.0–52.0)
Hemoglobin: 12.4 g/dL — ABNORMAL LOW (ref 13.0–17.0)
Immature Granulocytes: 0 %
Lymphocytes Relative: 9 %
Lymphs Abs: 0.8 10*3/uL (ref 0.7–4.0)
MCH: 29.3 pg (ref 26.0–34.0)
MCHC: 34.2 g/dL (ref 30.0–36.0)
MCV: 85.8 fL (ref 80.0–100.0)
Monocytes Absolute: 1.1 10*3/uL — ABNORMAL HIGH (ref 0.1–1.0)
Monocytes Relative: 12 %
Neutro Abs: 6.9 10*3/uL (ref 1.7–7.7)
Neutrophils Relative %: 77 %
Platelet Count: 246 10*3/uL (ref 150–400)
RBC: 4.23 MIL/uL (ref 4.22–5.81)
RDW: 14.1 % (ref 11.5–15.5)
WBC Count: 8.9 10*3/uL (ref 4.0–10.5)
nRBC: 0 % (ref 0.0–0.2)

## 2022-04-24 MED ORDER — SODIUM CHLORIDE 0.9% FLUSH
10.0000 mL | Freq: Once | INTRAVENOUS | Status: AC
  Administered 2022-04-24: 10 mL

## 2022-04-24 MED ORDER — FLUOROURACIL CHEMO INJECTION 2.5 GM/50ML
400.0000 mg/m2 | Freq: Once | INTRAVENOUS | Status: AC
  Administered 2022-04-24: 1000 mg via INTRAVENOUS
  Filled 2022-04-24: qty 20

## 2022-04-24 MED ORDER — SODIUM CHLORIDE 0.9 % IV SOLN
2400.0000 mg/m2 | INTRAVENOUS | Status: DC
  Administered 2022-04-24: 6150 mg via INTRAVENOUS
  Filled 2022-04-24: qty 123

## 2022-04-24 MED ORDER — OXALIPLATIN CHEMO INJECTION 100 MG/20ML
85.0000 mg/m2 | Freq: Once | INTRAVENOUS | Status: AC
  Administered 2022-04-24: 200 mg via INTRAVENOUS
  Filled 2022-04-24: qty 40

## 2022-04-24 MED ORDER — SODIUM CHLORIDE 0.9 % IV SOLN
10.0000 mg | Freq: Once | INTRAVENOUS | Status: AC
  Administered 2022-04-24: 10 mg via INTRAVENOUS
  Filled 2022-04-24: qty 10

## 2022-04-24 MED ORDER — PALONOSETRON HCL INJECTION 0.25 MG/5ML
0.2500 mg | Freq: Once | INTRAVENOUS | Status: AC
  Administered 2022-04-24: 0.25 mg via INTRAVENOUS
  Filled 2022-04-24: qty 5

## 2022-04-24 MED ORDER — LEUCOVORIN CALCIUM INJECTION 350 MG
400.0000 mg/m2 | Freq: Once | INTRAVENOUS | Status: AC
  Administered 2022-04-24: 1028 mg via INTRAVENOUS
  Filled 2022-04-24: qty 50

## 2022-04-24 MED ORDER — DEXTROSE 5 % IV SOLN: Freq: Once | INTRAVENOUS | Status: AC

## 2022-04-24 NOTE — Patient Instructions (Signed)
[facility]  Discharge Instructions: Thank you for choosing [facility] to provide your oncology and hematology care.   If you have a lab appointment with the [facility], please go directly to the [facility] and check in at the registration area.   Wear comfortable clothing and clothing appropriate for easy access to any Portacath or PICC line.   We strive to give you quality time with your provider. You may need to reschedule your appointment if you arrive late (15 or more minutes).  Arriving late affects you and other patients whose appointments are after yours.  Also, if you miss three or more appointments without notifying the office, you may be dismissed from the clinic at the provider's discretion.      For prescription refill requests, have your pharmacy contact our office and allow 72 hours for refills to be completed.    Today you received the following chemotherapy and/or immunotherapy agents: Oxaliplatin, leucovorin, 5FU      To help prevent nausea and vomiting after your treatment, we encourage you to take your nausea medication as directed.  BELOW ARE SYMPTOMS THAT SHOULD BE REPORTED IMMEDIATELY: *FEVER GREATER THAN 100.4 F (38 C) OR HIGHER *CHILLS OR SWEATING *NAUSEA AND VOMITING THAT IS NOT CONTROLLED WITH YOUR NAUSEA MEDICATION *UNUSUAL SHORTNESS OF BREATH *UNUSUAL BRUISING OR BLEEDING *URINARY PROBLEMS (pain or burning when urinating, or frequent urination) *BOWEL PROBLEMS (unusual diarrhea, constipation, pain near the anus) TENDERNESS IN MOUTH AND THROAT WITH OR WITHOUT PRESENCE OF ULCERS (sore throat, sores in mouth, or a toothache) UNUSUAL RASH, SWELLING OR PAIN  UNUSUAL VAGINAL DISCHARGE OR ITCHING   Items with * indicate a potential emergency and should be followed up as soon as possible or go to the Emergency Department if any problems should occur.  Please show the CHEMOTHERAPY ALERT CARD or IMMUNOTHERAPY  ALERT CARD at check-in to the Emergency Department and triage nurse.  Should you have questions after your visit or need to cancel or reschedule your appointment, please contact [facility]  Dept: [phone]  and follow the prompts.  Office hours are 8:00 a.m. to 4:30 p.m. Monday - Friday. Please note that voicemails left after 4:00 p.m. may not be returned until the following business day.  We are closed weekends and major holidays. You have access to a nurse at all times for urgent questions. Please call the main number to the clinic Dept: [phone] and follow the prompts.   For any non-urgent questions, you may also contact your provider using MyChart. We now offer e-Visits for anyone 66 and older to request care online for non-urgent symptoms. For details visit [email or web address].   Also download the MyChart app! Go to the app store, search "MyChart", open the app, select [facility], and log in with your MyChart username and password.

## 2022-04-24 NOTE — Progress Notes (Signed)
Per Dr. Irene Limbo, [place] to tx with ALT 164 and HR 104-110.

## 2022-04-25 ENCOUNTER — Telehealth: Payer: Self-pay

## 2022-04-25 NOTE — Telephone Encounter (Signed)
-----   Message from Dionne Ano, RN sent at 04/24/2022  2:38 PM EDT ----- Regarding: follow up 1st time oxaliplatin/leucovorin/5FU Dr Irene Limbo 04/26/2022 He has had disease progression and has been switched from Etoposide to folfox

## 2022-04-25 NOTE — Telephone Encounter (Signed)
Leon Fischer states that he is eating, drinking, and urinating well. He knows to call the office at [phone] if he has any questions or concerns.

## 2022-04-26 ENCOUNTER — Other Ambulatory Visit: Payer: Self-pay

## 2022-04-26 ENCOUNTER — Inpatient Hospital Stay: Payer: 59

## 2022-04-26 VITALS — BP 132/81 | HR 92 | Temp 98.9°F | Resp 18

## 2022-04-26 DIAGNOSIS — Z95828 Presence of other vascular implants and grafts: Secondary | ICD-10-CM

## 2022-04-26 DIAGNOSIS — C7A1 Malignant poorly differentiated neuroendocrine tumors: Secondary | ICD-10-CM

## 2022-04-26 DIAGNOSIS — Z7189 Other specified counseling: Secondary | ICD-10-CM

## 2022-04-26 DIAGNOSIS — Z5111 Encounter for antineoplastic chemotherapy: Secondary | ICD-10-CM | POA: Diagnosis not present

## 2022-04-26 MED ORDER — HEPARIN SOD (PORK) LOCK FLUSH 100 UNIT/ML IV SOLN
500.0000 [IU] | Freq: Once | INTRAVENOUS | Status: AC
  Administered 2022-04-26: 500 [IU]

## 2022-04-26 MED ORDER — SODIUM CHLORIDE 0.9% FLUSH
10.0000 mL | Freq: Once | INTRAVENOUS | Status: AC
  Administered 2022-04-26: 10 mL

## 2022-04-30 ENCOUNTER — Other Ambulatory Visit: Payer: Self-pay | Admitting: Hematology and Oncology

## 2022-04-30 ENCOUNTER — Telehealth: Payer: Self-pay | Admitting: Hematology

## 2022-04-30 DIAGNOSIS — C7A1 Malignant poorly differentiated neuroendocrine tumors: Secondary | ICD-10-CM

## 2022-04-30 DIAGNOSIS — Z7189 Other specified counseling: Secondary | ICD-10-CM

## 2022-04-30 NOTE — Telephone Encounter (Signed)
Scheduled appointment per [place] left voicemail.

## 2022-05-07 ENCOUNTER — Other Ambulatory Visit: Payer: Self-pay

## 2022-05-07 MED FILL — Dexamethasone Sodium Phosphate Inj 100 MG/10ML: INTRAMUSCULAR | Qty: 1 | Status: AC

## 2022-05-08 ENCOUNTER — Inpatient Hospital Stay (HOSPITAL_BASED_OUTPATIENT_CLINIC_OR_DEPARTMENT_OTHER): Payer: 59 | Admitting: Physician Assistant

## 2022-05-08 ENCOUNTER — Telehealth: Payer: Self-pay | Admitting: Hematology

## 2022-05-08 ENCOUNTER — Inpatient Hospital Stay: Payer: 59

## 2022-05-08 ENCOUNTER — Other Ambulatory Visit: Payer: Self-pay

## 2022-05-08 ENCOUNTER — Inpatient Hospital Stay: Payer: 59 | Attending: Hematology

## 2022-05-08 DIAGNOSIS — Z5111 Encounter for antineoplastic chemotherapy: Secondary | ICD-10-CM | POA: Diagnosis present

## 2022-05-08 DIAGNOSIS — Z7189 Other specified counseling: Secondary | ICD-10-CM

## 2022-05-08 DIAGNOSIS — C7A1 Malignant poorly differentiated neuroendocrine tumors: Secondary | ICD-10-CM

## 2022-05-08 DIAGNOSIS — Z95828 Presence of other vascular implants and grafts: Secondary | ICD-10-CM

## 2022-05-08 DIAGNOSIS — R7401 Elevation of levels of liver transaminase levels: Secondary | ICD-10-CM

## 2022-05-08 DIAGNOSIS — C7B8 Other secondary neuroendocrine tumors: Secondary | ICD-10-CM | POA: Diagnosis not present

## 2022-05-08 DIAGNOSIS — C7A8 Other malignant neuroendocrine tumors: Secondary | ICD-10-CM | POA: Diagnosis present

## 2022-05-08 LAB — CMP (CANCER CENTER ONLY)
ALT: 1222 U/L (ref 0–44)
AST: 586 U/L (ref 15–41)
Albumin: 4.1 g/dL (ref 3.5–5.0)
Alkaline Phosphatase: 349 U/L — ABNORMAL HIGH (ref 38–126)
Anion gap: 6 (ref 5–15)
BUN: 9 mg/dL (ref 6–20)
CO2: 28 mmol/L (ref 22–32)
Calcium: 9.7 mg/dL (ref 8.9–10.3)
Chloride: 105 mmol/L (ref 98–111)
Creatinine: 0.65 mg/dL (ref 0.61–1.24)
GFR, Estimated: >60 mL/min (ref >60)
Glucose, Bld: 140 mg/dL — ABNORMAL HIGH (ref 70–99)
Potassium: 3.8 mmol/L (ref 3.5–5.1)
Sodium: 139 mmol/L (ref 135–145)
Total Bilirubin: 0.7 mg/dL (ref 0.3–1.2)
Total Protein: 7.4 g/dL (ref 6.5–8.1)

## 2022-05-08 LAB — CBC WITH DIFFERENTIAL (CANCER CENTER ONLY)
Abs Immature Granulocytes: 0.01 10*3/uL (ref 0.00–0.07)
Basophils Absolute: 0 10*3/uL (ref 0.0–0.1)
Basophils Relative: 0 %
Eosinophils Absolute: 0.1 10*3/uL (ref 0.0–0.5)
Eosinophils Relative: 4 %
HCT: 34.4 % — ABNORMAL LOW (ref 39.0–52.0)
Hemoglobin: 11.6 g/dL — ABNORMAL LOW (ref 13.0–17.0)
Immature Granulocytes: 0 %
Lymphocytes Relative: 26 %
Lymphs Abs: 0.7 10*3/uL (ref 0.7–4.0)
MCH: 29.1 pg (ref 26.0–34.0)
MCHC: 33.7 g/dL (ref 30.0–36.0)
MCV: 86.2 fL (ref 80.0–100.0)
Monocytes Absolute: 0.3 10*3/uL (ref 0.1–1.0)
Monocytes Relative: 11 %
Neutro Abs: 1.6 10*3/uL — ABNORMAL LOW (ref 1.7–7.7)
Neutrophils Relative %: 59 %
Platelet Count: 178 10*3/uL (ref 150–400)
RBC: 3.99 MIL/uL — ABNORMAL LOW (ref 4.22–5.81)
RDW: 15.2 % (ref 11.5–15.5)
WBC Count: 2.7 10*3/uL — ABNORMAL LOW (ref 4.0–10.5)
nRBC: 0 % (ref 0.0–0.2)

## 2022-05-08 MED ORDER — SODIUM CHLORIDE 0.9% FLUSH
10.0000 mL | Freq: Once | INTRAVENOUS | Status: AC
  Administered 2022-05-08: 10 mL

## 2022-05-08 NOTE — Progress Notes (Signed)
CRITICAL VALUE STICKER  CRITICAL VALUE: AST 586, ALT 1222  RECEIVER (on-site recipient of call): Velna Ochs RN  DATE & TIME NOTIFIED: 05/08/22 @ 1218  MESSENGER (representative from lab): Verdis Frederickson  MD NOTIFIED: Remus Blake PA - she notified Dr Lorenso Courier  TIME OF NOTIFICATION: 1220  RESPONSE:  MD & PA aware

## 2022-05-08 NOTE — Telephone Encounter (Signed)
Called patient per 4/3 secure chat to reschedule deferred treatment. Left voicemail with new appointment information and contact details if needing to reschedule.

## 2022-05-09 ENCOUNTER — Encounter: Payer: Self-pay | Admitting: Hematology

## 2022-05-09 ENCOUNTER — Ambulatory Visit: Payer: 59

## 2022-05-09 ENCOUNTER — Other Ambulatory Visit: Payer: Self-pay

## 2022-05-09 NOTE — Progress Notes (Signed)
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   CURRENT TREATMENT: FOLFOX 04/24/2022: Cycle 1 05/07/2022: Cycle 2 HELD due to elevated LFTs.    INTERVAL HISTORY:  Leon Fischer. Is a 32 y.o. male returns today for a follow up for high grade neuroendocrine carcinoma prior to Cycle 2, Day 1 of FOLFOX chemotherapy. He was last seen by Dr.  Limbo on 04/08/2022 and in the interim, he has started FOLFOX chemotherapy.  Mr. Fekete reports that he tolerated his first cycle of chemotherapy without any significant limitations. His energy and appetite are fairly stable. He has lost approximately 12 lbs since 04/08/2022. He denies nausea, vomiting or abdominal pain. His bowel habits are unchanged without recurrent episodes of diarrhea or constipation.  He denies easy bruising or signs of active bleeding. He experienced mild cold sensitivity in his fingertips but denies any residual neuropathy. He denies fevers, chills, night sweats, shortness of breath, chest pain or cough. He has no other complaints.   MEDICAL HISTORY:  Past Medical History:  Diagnosis Date   Asthma    Neuroendocrine cancer ([place])     SURGICAL HISTORY: Past Surgical History:  Procedure Laterality Date   BILIARY DILATION  07/13/2021   Procedure: BILIARY DILATION;  Surgeon: Clarene Essex, MD;  Location: [facility];  Service: Gastroenterology;;   BILIARY STENT PLACEMENT N/A 07/13/2021   Procedure: BILIARY STENT PLACEMENT;  Surgeon: Clarene Essex, MD;  Location: [name];  Service: Gastroenterology;  Laterality: N/A;   BILIARY STENT  PLACEMENT N/A 11/05/2021   Procedure: BILIARY STENT PLACEMENT;  Surgeon: Clarene Essex, MD;  Location: [name];  Service: Gastroenterology;  Laterality: N/A;   BIOPSY  07/13/2021   Procedure: BIOPSY;  Surgeon: Rush Landmark Telford Nab., MD;  Location: [facility];  Service: Gastroenterology;;   CHOLECYSTECTOMY N/A 11/07/2021   Procedure: LAPAROSCOPIC CHOLECYSTECTOMY;  Surgeon: Clovis Riley, MD;  Location: [facility];  Service: General;  Laterality: N/A;   ENDOSCOPIC RETROGRADE CHOLANGIOPANCREATOGRAPHY (ERCP) WITH PROPOFOL N/A 07/13/2021   Procedure: ENDOSCOPIC RETROGRADE CHOLANGIOPANCREATOGRAPHY (ERCP) WITH PROPOFOL;  Surgeon: Clarene Essex, MD;  Location: [name];  Service: Gastroenterology;  Laterality: N/A;   ERCP N/A 11/05/2021   Procedure: ENDOSCOPIC RETROGRADE CHOLANGIOPANCREATOGRAPHY (ERCP);  Surgeon: Clarene Essex, MD;  Location: [facility];  Service: Gastroenterology;  Laterality: N/A;   ESOPHAGOGASTRODUODENOSCOPY (EGD) WITH PROPOFOL N/A 07/13/2021   Procedure: ESOPHAGOGASTRODUODENOSCOPY (EGD) WITH PROPOFOL;  Surgeon: Rush Landmark Telford Nab., MD;  Location: [name];  Service: Gastroenterology;  Laterality: N/A;   EUS N/A 07/13/2021   Procedure: UPPER ENDOSCOPIC ULTRASOUND (EUS) LINEAR;  Surgeon: Irving Copas., MD;  Location: [name];  Service: Gastroenterology;  Laterality: N/A;   FINE NEEDLE ASPIRATION  07/13/2021   Procedure: FINE NEEDLE ASPIRATION (FNA) LINEAR;  Surgeon: Irving Copas., MD;  Location: [facility];  Service: Gastroenterology;;   IR IMAGING GUIDED PORT INSERTION  07/31/2021   SPHINCTEROTOMY  07/13/2021   Procedure: Joan Mayans;  Surgeon: Clarene Essex, MD;  Location: [name];  Service: Gastroenterology;;    SOCIAL HISTORY: Social History   Socioeconomic History   Marital status: Single    Spouse name: Not on file   Number of children: 0   Years of education: Not on file  Highest education level: Some college, no degree  Occupational  History   Not on file  Tobacco Use   Smoking status: Some Days    Types: Cigarettes   Smokeless tobacco: Never  Vaping Use   Vaping Use: Never used  Substance and Sexual Activity   Alcohol use: Yes    Comment: social   Drug use: No   Sexual activity: Never  Other Topics Concern   Not on file  Social History Narrative   Not on file   Social Determinants of Health   Financial Resource Strain: Low Risk  (04/14/2017)   Overall Financial Resource Strain (CARDIA)    Difficulty of Paying Living Expenses: Not hard at all  Food Insecurity: No Food Insecurity (11/07/2021)   Hunger Vital Sign    Worried About Running Out of Food in the Last Year: Never true    Ran Out of Food in the Last Year: Never true  Transportation Needs: No Transportation Needs (11/07/2021)   PRAPARE - Hydrologist (Medical): No    Lack of Transportation (Non-Medical): No  Physical Activity: Inactive (04/14/2017)   Exercise Vital Sign    Days of Exercise per Week: 0 days    Minutes of Exercise per Session: 0 min  Stress: Stress Concern Present (04/14/2017)   [place]    Feeling of Stress : Rather much  Social Connections: Moderately Isolated (04/14/2017)   Social Connection and Isolation Panel [NHANES]    Frequency of Communication with Friends and Family: More than three times a week    Frequency of Social Gatherings with Friends and Family: More than three times a week    Attends Religious Services: Never    Marine scientist or Organizations: No    Attends Archivist Meetings: Never    Marital Status: Never married  Intimate Partner Violence: Not At Risk (11/07/2021)   Humiliation, Afraid, Rape, and Kick questionnaire    Fear of Current or Ex-Partner: No    Emotionally Abused: No    Physically Abused: No    Sexually Abused: No    FAMILY HISTORY: Family History  Problem Relation Age of Onset    Drug abuse Father    ALLERGIES:  is allergic to shellfish allergy and other.  MEDICATIONS:  Current Outpatient Medications  Medication Sig Dispense Refill   acetaminophen (TYLENOL) 500 MG tablet Take 1,000 mg by mouth every 6 (six) hours as needed for mild pain.     albuterol (VENTOLIN HFA) 108 (90 Base) MCG/ACT inhaler INHALE 2 PUFFS BY MOUTH EVERY 4 HOURS AS NEEDED FOR WHEEZING FOR SHORTNESS OF BREATH 18 g 0   dexamethasone (DECADRON) 4 MG tablet Take 2 tablets (8 mg total) by mouth daily. Start the day after chemotherapy for 2 days. Take with food. 30 tablet 1   diclofenac Sodium (VOLTAREN) 1 % GEL Apply 4 g topically 4 (four) times daily. (Patient taking differently: Apply 4 g topically 4 (four) times daily as needed (pain).) 100 g 0   lidocaine-prilocaine (EMLA) cream Apply to affected area once 30 g 3   methocarbamol (ROBAXIN) 500 MG tablet Take 1 tablet (500 mg total) by mouth 2 (two) times daily. 20 tablet 0   ondansetron (ZOFRAN) 8 MG tablet Take 1 tablet (8 mg total) by mouth every 8 (eight) hours as needed for nausea or vomiting. Start on the third day after chemotherapy. 30 tablet 1   oxyCODONE (OXY IR/ROXICODONE)  5 MG immediate release tablet Take 1 tablet (5 mg total) by mouth every 6 (six) hours as needed for severe pain or moderate pain. 20 tablet 0   polyethylene glycol (MIRALAX / GLYCOLAX) 17 g packet Take 17 g by mouth daily. (Patient taking differently: Take 17 g by mouth daily as needed for mild constipation or moderate constipation.) 14 each 0   prochlorperazine (COMPAZINE) 10 MG tablet Take 1 tablet (10 mg total) by mouth every 6 (six) hours as needed for nausea or vomiting. 30 tablet 1   senna-docusate (SENOKOT-S) 8.6-50 MG tablet Take 2 tablets by mouth 2 (two) times daily. (Patient taking differently: Take 2 tablets by mouth daily as needed for mild constipation or moderate constipation.) 30 tablet 0   No current facility-administered medications for this visit.     REVIEW OF SYSTEMS:    10 Point review of Systems was done is negative except as noted above.  PHYSICAL EXAMINATION: ECOG PERFORMANCE STATUS: 1 - Symptomatic but completely ambulatory  05/08/22  Weight 274 lb 8 oz (124.5 kg)  Temp 98.6 F (37 C)  Temp src Oral  Pulse 106 !  Resp 18  BP 126/91 (H)   GENERAL:alert, in no acute distress and comfortable SKIN: no acute rashes, no significant lesions EYES: conjunctiva are pink and non-injected, sclera anicteric NECK: supple, no JVD LUNGS: clear to auscultation b/l with normal respiratory effort HEART: regular rate & rhythm Extremity: no pedal edema PSYCH: alert & oriented x 3 with fluent speech NEURO: no focal motor/sensory deficits  LABORATORY DATA:  I have reviewed the data as listed  .    Latest Ref Rng & Units 05/08/2022   11:15 AM 04/24/2022   11:14 AM 04/17/2022    6:23 PM  CBC  WBC 4.0 - 10.5 K/uL 2.7  8.9  5.9   Hemoglobin 13.0 - 17.0 g/dL 11.6  12.4  12.5   Hematocrit 39.0 - 52.0 % 34.4  36.3  38.9   Platelets 150 - 400 K/uL 178  246  225     .    Latest Ref Rng & Units 05/08/2022   11:15 AM 04/24/2022   11:14 AM 04/17/2022    6:23 PM  CMP  Glucose 70 - 99 mg/dL 140  103  119   BUN 6 - 20 mg/dL 9  6  9    Creatinine 0.61 - 1.24 mg/dL 0.65  0.62  0.58   Sodium 135 - 145 mmol/L 139  136  134   Potassium 3.5 - 5.1 mmol/L 3.8  3.5  3.8   Chloride 98 - 111 mmol/L 105  98  99   CO2 22 - 32 mmol/L 28  29  27    Calcium 8.9 - 10.3 mg/dL 9.7  9.8  9.2   Total Protein 6.5 - 8.1 g/dL 7.4  7.9  8.5   Total Bilirubin 0.3 - 1.2 mg/dL 0.7  0.6  0.5   Alkaline Phos 38 - 126 U/L 349  284  87   AST 15 - 41 U/L 586  42  27   ALT 0 - 44 U/L 1,222  164  41    Cytology done 07/13/2021 revealed "FINAL MICROSCOPIC DIAGNOSIS:  A. PANCREAS, HEAD, FINE NEEDLE ASPIRATION:  - Malignant cells present  - Poorly differentiated/high-grade neuroendocrine carcinoma (see  comment)   B. CBD STRICTURE, BRUSHING:  - Atypical cells  suspicious for tumor   C. BILIARY DILATION, BALLOON, REMOVAL:  - Atypical cells suspicious for tumor "  Surgical  pathology done 07/13/2021 revealed "FINAL MICROSCOPIC DIAGNOSIS:   A. STOMACH, BIOPSY:  Reactive gastropathy and minimal chronic gastritis with lymphoid  aggregate  Negative for H. pylori, intestinal metaplasia, dysplasia and carcinoma"  RADIOGRAPHIC STUDIES: I have personally reviewed the radiological images as listed and agreed with the findings in the report. CT Head Wo Contrast  Result Date: 04/18/2022 CLINICAL DATA:  Altered mental status EXAM: CT HEAD WITHOUT CONTRAST TECHNIQUE: Contiguous axial images were obtained from the base of the skull through the vertex without intravenous contrast. RADIATION DOSE REDUCTION: This exam was performed according to the departmental dose-optimization program which includes automated exposure control, adjustment of the mA and/or kV according to patient size and/or use of iterative reconstruction technique. COMPARISON:  01/14/2019 FINDINGS: Brain: There is no mass, hemorrhage or extra-axial collection. The size and configuration of the ventricles and extra-axial CSF spaces are normal. The brain parenchyma is normal, without acute or chronic infarction. Vascular: No abnormal hyperdensity of the major intracranial arteries or dural venous sinuses. No intracranial atherosclerosis. Skull: The visualized skull base, calvarium and extracranial soft tissues are normal. Sinuses/Orbits: No fluid levels or advanced mucosal thickening of the visualized paranasal sinuses. No mastoid or middle ear effusion. The orbits are normal. IMPRESSION: Normal head CT. Electronically Signed   By: Ulyses Jarred M.D.   On: 04/18/2022 02:40     ASSESSMENT & PLAN:  Leon Fischer. Is a 32 y.o. male who presents for a follow up for high grade neuroendocrine carcinoma.   1. 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 -Cytology done 07/13/2021 shows presence of malignant cells in pancreas and poorly differentiated/high grade neuroendocrine carcinoma. -Received 6 cycles of first line chemotherapy with carboplatin and etoposide from 08/01/2021-12/26/2021.Treatment was discontinued after CT CAP from 04/04/2022 showed progression of disease. -Started second line chemotherapy with FOLFOX on 04/24/2022.  Plan -Due for Cycle 2 of FOLFOX today -Labs from today were reviewed with patient. WBC 2.7, ANC 1.6, Hgb 11.6, Plt 178K. LFTS are markedly elevated with AST 586 and ALT 1222, Alk phos 349. Tbili is normal at 0.7 -Suspect drug toxicity as cause of elevated liver enzymes. Recommend to hold chemotherapy today. -Repeat labs on 05/09/2021 to monitor LFTs. If levels are not improving and/or Tbili is elevated, we will obtain repeat abdominal imaging to rule out progressive liver disease.   Follow up: --RTC in one week for port labs and f/u visit with Dr. Marranda Arakelian Limbo before Cycle 2 of FOLFOX  All of the patient's questions were answered with apparent satisfaction. The patient knows to call the clinic with any problems, questions or concerns.  I have spent a total of 30 minutes minutes of face-to-face and non-face-to-face time, preparing to see the patient, , performing a medically appropriate examination, counseling and educating the patient,documenting clinical information in the electronic health record, and care coordination.   Dede Query PA-C Dept of Hematology and [place] [facility] Phone: [phone]

## 2022-05-10 ENCOUNTER — Inpatient Hospital Stay: Payer: 59

## 2022-05-10 ENCOUNTER — Telehealth: Payer: Self-pay

## 2022-05-10 ENCOUNTER — Other Ambulatory Visit: Payer: Self-pay

## 2022-05-10 DIAGNOSIS — C7A1 Malignant poorly differentiated neuroendocrine tumors: Secondary | ICD-10-CM

## 2022-05-10 DIAGNOSIS — Z95828 Presence of other vascular implants and grafts: Secondary | ICD-10-CM

## 2022-05-10 DIAGNOSIS — Z5111 Encounter for antineoplastic chemotherapy: Secondary | ICD-10-CM | POA: Diagnosis not present

## 2022-05-10 DIAGNOSIS — R7401 Elevation of levels of liver transaminase levels: Secondary | ICD-10-CM

## 2022-05-10 DIAGNOSIS — Z7189 Other specified counseling: Secondary | ICD-10-CM

## 2022-05-10 LAB — CBC WITH DIFFERENTIAL (CANCER CENTER ONLY)
Abs Immature Granulocytes: 0 10*3/uL (ref 0.00–0.07)
Basophils Absolute: 0 10*3/uL (ref 0.0–0.1)
Basophils Relative: 0 %
Eosinophils Absolute: 0.2 10*3/uL (ref 0.0–0.5)
Eosinophils Relative: 5 %
HCT: 33.9 % — ABNORMAL LOW (ref 39.0–52.0)
Hemoglobin: 11.3 g/dL — ABNORMAL LOW (ref 13.0–17.0)
Immature Granulocytes: 0 %
Lymphocytes Relative: 27 %
Lymphs Abs: 0.8 10*3/uL (ref 0.7–4.0)
MCH: 29 pg (ref 26.0–34.0)
MCHC: 33.3 g/dL (ref 30.0–36.0)
MCV: 86.9 fL (ref 80.0–100.0)
Monocytes Absolute: 0.4 10*3/uL (ref 0.1–1.0)
Monocytes Relative: 14 %
Neutro Abs: 1.7 10*3/uL (ref 1.7–7.7)
Neutrophils Relative %: 54 %
Platelet Count: 211 10*3/uL (ref 150–400)
RBC: 3.9 MIL/uL — ABNORMAL LOW (ref 4.22–5.81)
RDW: 15.9 % — ABNORMAL HIGH (ref 11.5–15.5)
WBC Count: 3.1 10*3/uL — ABNORMAL LOW (ref 4.0–10.5)
nRBC: 0 % (ref 0.0–0.2)

## 2022-05-10 LAB — CMP (CANCER CENTER ONLY)
ALT: 585 U/L (ref 0–44)
AST: 118 U/L — ABNORMAL HIGH (ref 15–41)
Albumin: 4 g/dL (ref 3.5–5.0)
Alkaline Phosphatase: 247 U/L — ABNORMAL HIGH (ref 38–126)
Anion gap: 6 (ref 5–15)
BUN: 8 mg/dL (ref 6–20)
CO2: 28 mmol/L (ref 22–32)
Calcium: 9.5 mg/dL (ref 8.9–10.3)
Chloride: 106 mmol/L (ref 98–111)
Creatinine: 0.64 mg/dL (ref 0.61–1.24)
GFR, Estimated: >60 mL/min (ref >60)
Glucose, Bld: 132 mg/dL — ABNORMAL HIGH (ref 70–99)
Potassium: 3.5 mmol/L (ref 3.5–5.1)
Sodium: 140 mmol/L (ref 135–145)
Total Bilirubin: 0.4 mg/dL (ref 0.3–1.2)
Total Protein: 7.1 g/dL (ref 6.5–8.1)

## 2022-05-10 MED ORDER — SODIUM CHLORIDE 0.9% FLUSH
10.0000 mL | Freq: Once | INTRAVENOUS | Status: AC
  Administered 2022-05-10: 10 mL

## 2022-05-10 MED ORDER — HEPARIN SOD (PORK) LOCK FLUSH 100 UNIT/ML IV SOLN
500.0000 [IU] | Freq: Once | INTRAVENOUS | Status: AC
  Administered 2022-05-10: 500 [IU]

## 2022-05-10 NOTE — Telephone Encounter (Signed)
CRITICAL VALUE STICKER  CRITICAL VALUE:  ALT 585  RECEIVER (on-site recipient of call): Sharlette Dense CMA  DATE & TIME NOTIFIED: 05/10/2022 1200  MESSENGER (representative from lab): Pam in lab  MD NOTIFIED: Dr. Candise Che  TIME OF NOTIFICATION:  1200  RESPONSE: Made nurse aware of lab

## 2022-05-13 ENCOUNTER — Other Ambulatory Visit: Payer: Self-pay

## 2022-05-13 DIAGNOSIS — C7A1 Malignant poorly differentiated neuroendocrine tumors: Secondary | ICD-10-CM

## 2022-05-15 ENCOUNTER — Other Ambulatory Visit: Payer: Self-pay

## 2022-05-15 ENCOUNTER — Inpatient Hospital Stay (HOSPITAL_BASED_OUTPATIENT_CLINIC_OR_DEPARTMENT_OTHER): Payer: 59 | Admitting: Hematology

## 2022-05-15 ENCOUNTER — Inpatient Hospital Stay: Payer: 59

## 2022-05-15 VITALS — BP 155/105 | HR 98 | Temp 97.7°F | Resp 20 | Wt 280.0 lb

## 2022-05-15 DIAGNOSIS — Z95828 Presence of other vascular implants and grafts: Secondary | ICD-10-CM

## 2022-05-15 DIAGNOSIS — Z7189 Other specified counseling: Secondary | ICD-10-CM

## 2022-05-15 DIAGNOSIS — Z5111 Encounter for antineoplastic chemotherapy: Secondary | ICD-10-CM | POA: Diagnosis not present

## 2022-05-15 DIAGNOSIS — C7A1 Malignant poorly differentiated neuroendocrine tumors: Secondary | ICD-10-CM | POA: Diagnosis not present

## 2022-05-15 LAB — CBC WITH DIFFERENTIAL (CANCER CENTER ONLY)
Abs Immature Granulocytes: 0.02 10*3/uL (ref 0.00–0.07)
Basophils Absolute: 0 10*3/uL (ref 0.0–0.1)
Basophils Relative: 1 %
Eosinophils Absolute: 0.2 10*3/uL (ref 0.0–0.5)
Eosinophils Relative: 7 %
HCT: 34.5 % — ABNORMAL LOW (ref 39.0–52.0)
Hemoglobin: 11.5 g/dL — ABNORMAL LOW (ref 13.0–17.0)
Immature Granulocytes: 1 %
Lymphocytes Relative: 31 %
Lymphs Abs: 1 10*3/uL (ref 0.7–4.0)
MCH: 29 pg (ref 26.0–34.0)
MCHC: 33.3 g/dL (ref 30.0–36.0)
MCV: 86.9 fL (ref 80.0–100.0)
Monocytes Absolute: 0.6 10*3/uL (ref 0.1–1.0)
Monocytes Relative: 19 %
Neutro Abs: 1.3 10*3/uL — ABNORMAL LOW (ref 1.7–7.7)
Neutrophils Relative %: 41 %
Platelet Count: 302 10*3/uL (ref 150–400)
RBC: 3.97 MIL/uL — ABNORMAL LOW (ref 4.22–5.81)
RDW: 16.5 % — ABNORMAL HIGH (ref 11.5–15.5)
WBC Count: 3.1 10*3/uL — ABNORMAL LOW (ref 4.0–10.5)
nRBC: 0 % (ref 0.0–0.2)

## 2022-05-15 LAB — CMP (CANCER CENTER ONLY)
ALT: 400 U/L (ref 0–44)
AST: 129 U/L — ABNORMAL HIGH (ref 15–41)
Albumin: 4.1 g/dL (ref 3.5–5.0)
Alkaline Phosphatase: 370 U/L — ABNORMAL HIGH (ref 38–126)
Anion gap: 5 (ref 5–15)
BUN: 7 mg/dL (ref 6–20)
CO2: 29 mmol/L (ref 22–32)
Calcium: 9.6 mg/dL (ref 8.9–10.3)
Chloride: 106 mmol/L (ref 98–111)
Creatinine: 0.6 mg/dL — ABNORMAL LOW (ref 0.61–1.24)
GFR, Estimated: >60 mL/min (ref >60)
Glucose, Bld: 113 mg/dL — ABNORMAL HIGH (ref 70–99)
Potassium: 3.5 mmol/L (ref 3.5–5.1)
Sodium: 140 mmol/L (ref 135–145)
Total Bilirubin: 0.5 mg/dL (ref 0.3–1.2)
Total Protein: 7.4 g/dL (ref 6.5–8.1)

## 2022-05-15 MED ORDER — HEPARIN SOD (PORK) LOCK FLUSH 100 UNIT/ML IV SOLN
500.0000 [IU] | Freq: Once | INTRAVENOUS | Status: AC
  Administered 2022-05-15: 500 [IU]

## 2022-05-15 MED ORDER — SODIUM CHLORIDE 0.9% FLUSH
10.0000 mL | Freq: Once | INTRAVENOUS | Status: AC
  Administered 2022-05-15: 10 mL

## 2022-05-15 MED FILL — Dexamethasone Sodium Phosphate Inj 100 MG/10ML: INTRAMUSCULAR | Qty: 1 | Status: AC

## 2022-05-15 NOTE — Progress Notes (Signed)
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  HISTORY OF PRESENTING ILLNESS:  Leon LivJames Michael Gumina Jr. is a wonderful 32 y.o. 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 He presents today accompanied by his mother. He reports He is doing well.  He reports no color change in stool.  He notes that he is eating well but feels food just sits in stomach.  We discussed getting additional scans and labs for further evaluation and treatment which he was agreeable to. We further discussed getting MRI of brain and PET scan which he was agreeable to.  He notes that he is trying to hold it together mentally and is still trying to process his new diagnosis. We discussed the options of psychological or spiritual counseling which he has not decided on at this time. He was advised that may call back if he decides that he would like to pursue either.  We discussed getting a port a cath installed which he was agreeable to.  He recently had a biliary stent put into common bile duct.   No abdominal pain or change in bowel habits. No new or unexpected weight loss. No other new or acute focal symptoms.  We discussed his recent stomach biopsy done 07/13/2021 which sowed gastropathy and minor chronic gastritis with lymphoid aggregate. Negative for H. Pylori.  We discussed recent cytology done 07/13/2021 which showed presence of malignant cells in pancreas and poorly differentiated/high grade neuroendocrine carcinoma.  We discussed upper endoscopy done 07/13/2021 revealed mass in pancreatic head and  some enlarged lymph nodes were seen in the celiac and peripancreatic regions.  Labs done today were reviewed in detail.  INTERVAL HISTORY: Leon LivJames Michael Zale Jr. Is a 32 y.o. 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   Patient was last seen by me on 04/08/22 and was doing well overall at that time. He did have occasional back pain for which he was using oxycodone.   He was seen in the ED on 04/17/22 with complaints of nausea/vomiting since 02/2022 with associated poor appetite and occasional lower abdominal pain x4 days.  Today, he states that he tolerated his first treatment okay apart from a few days of cold intolerance with eating/drinking. This has resolved and he has no other symptoms at this time.  He denies any mouth soreness, new skin rashes, numbness/tingling in his hands/feet, shortness of breath, chest pain, or abdominal pain.  He would still like to hold off on establishing care [facility].  MEDICAL HISTORY:  Past Medical History:  Diagnosis Date   Asthma    Neuroendocrine cancer (HCC)     SURGICAL HISTORY: Past Surgical History:  Procedure Laterality Date   BILIARY DILATION  07/13/2021   Procedure: BILIARY DILATION;  Surgeon: Vida RiggerMagod, Marc, MD;  [facility];  Service: Gastroenterology;;   BILIARY STENT PLACEMENT N/A 07/13/2021   Procedure: BILIARY STENT PLACEMENT;  Surgeon: Vida RiggerMagod, Marc, MD;  Location: [name];  Service: Gastroenterology;  Laterality: N/A;   BILIARY STENT PLACEMENT N/A 11/05/2021   Procedure: BILIARY STENT PLACEMENT;  Surgeon: Vida RiggerMagod, Marc, MD;  Location: [name];  Service: Gastroenterology;  Laterality: N/A;   BIOPSY  07/13/2021   Procedure: BIOPSY;  Surgeon: Lemar Lofty., MD;  [facility];  Service: Gastroenterology;;   CHOLECYSTECTOMY N/A 11/07/2021   Procedure: LAPAROSCOPIC CHOLECYSTECTOMY;  Surgeon: Berna Bue, MD;  [facility]: [facility];  Service:  General;  Laterality: N/A;   ENDOSCOPIC RETROGRADE CHOLANGIOPANCREATOGRAPHY (ERCP) WITH PROPOFOL N/A 07/13/2021   Procedure: ENDOSCOPIC RETROGRADE CHOLANGIOPANCREATOGRAPHY (ERCP) WITH PROPOFOL;  Surgeon: Vida Rigger, MD;  [facility]: [name];  Service: Gastroenterology;  Laterality: N/A;   ERCP N/A 11/05/2021   Procedure: ENDOSCOPIC RETROGRADE CHOLANGIOPANCREATOGRAPHY (ERCP);  Surgeon: Vida Rigger, MD;  [facility];  Service: Gastroenterology;  Laterality: N/A;   ESOPHAGOGASTRODUODENOSCOPY (EGD) WITH PROPOFOL N/A 07/13/2021   Procedure: ESOPHAGOGASTRODUODENOSCOPY (EGD) WITH PROPOFOL;  Surgeon: Meridee Score Netty Starring., MD;  [name];  Service: Gastroenterology;  Laterality: N/A;   EUS N/A 07/13/2021   Procedure: UPPER ENDOSCOPIC ULTRASOUND (EUS) LINEAR;  Surgeon: Lemar Lofty., MD;  Location: [name];  Service: Gastroenterology;  Laterality: N/A;   FINE NEEDLE ASPIRATION  07/13/2021   Procedure: FINE NEEDLE ASPIRATION (FNA) LINEAR;  Surgeon: Lemar Lofty., MD;  Location[facility];  Service: Gastroenterology;;   IR IMAGING GUIDED PORT INSERTION  07/31/2021   SPHINCTEROTOMY  07/13/2021   Procedure: Dennison Mascot;  Surgeon: Vida Rigger, MD;  Location: [name];  Service: Gastroenterology;;    SOCIAL HISTORY: Social History   Socioeconomic History   Marital status: Single    Spouse name: Not on file   Number of children: 0   Years of education: Not on file   Highest education level: Some college, no degree  Occupational History   Not on file  Tobacco Use   Smoking status: Some Days    Types: Cigarettes   Smokeless tobacco: Never  Vaping Use   Vaping Use: Never used  Substance and Sexual Activity   Alcohol use: Yes    Comment: social   Drug use: No   Sexual activity: Never  Other Topics Concern   Not on file  Social History Narrative   Not on file   Social Determinants of Health   Financial Resource Strain: Low Risk  (04/14/2017)    Overall Financial Resource Strain (CARDIA)    Difficulty of Paying Living Expenses: Not hard at all  Food Insecurity: No Food Insecurity (11/07/2021)   Hunger Vital Sign    Worried About Running Out of Food in the Last Year: Never true    Ran Out of Food in the Last Year: Never true  Transportation Needs: No Transportation Needs (11/07/2021)   PRAPARE - Administrator, Civil Service (Medical): No    Lack of Transportation (Non-Medical): No  Physical Activity: Inactive (04/14/2017)   Exercise Vital Sign    Days of Exercise per Week: 0 days    Minutes of Exercise per Session: 0 min  Stress: Stress Concern Present (04/14/2017)   [name] of Occupational Health - Occupational Stress Questionnaire    Feeling of Stress : Rather much  Social Connections: Moderately Isolated (04/14/2017)   Social Connection and Isolation Panel [NHANES]    Frequency of Communication with Friends and Family: More than three times a week    Frequency of Social Gatherings with Friends and Family: More than three times a week    Attends Religious Services: Never    Database administrator or Organizations: No    Attends Banker Meetings: Never    Marital Status: Never married  Intimate Partner Violence: Not At Risk (11/07/2021)   Humiliation,  Afraid, Rape, and Kick questionnaire    Fear of Current or Ex-Partner: No    Emotionally Abused: No    Physically Abused: No    Sexually Abused: No    FAMILY HISTORY: Family History  Problem Relation Age of Onset   Drug abuse Father    ALLERGIES:  is allergic to shellfish allergy and other.  MEDICATIONS:  Current Outpatient Medications  Medication Sig Dispense Refill   acetaminophen (TYLENOL) 500 MG tablet Take 1,000 mg by mouth every 6 (six) hours as needed for mild pain.     albuterol (VENTOLIN HFA) 108 (90 Base) MCG/ACT inhaler INHALE 2 PUFFS BY MOUTH EVERY 4 HOURS AS NEEDED FOR WHEEZING FOR SHORTNESS OF BREATH 18 g 0    dexamethasone (DECADRON) 4 MG tablet Take 2 tablets (8 mg total) by mouth daily. Start the day after chemotherapy for 2 days. Take with food. 30 tablet 1   diclofenac Sodium (VOLTAREN) 1 % GEL Apply 4 g topically 4 (four) times daily. (Patient taking differently: Apply 4 g topically 4 (four) times daily as needed (pain).) 100 g 0   lidocaine-prilocaine (EMLA) cream Apply to affected area once 30 g 3   methocarbamol (ROBAXIN) 500 MG tablet Take 1 tablet (500 mg total) by mouth 2 (two) times daily. 20 tablet 0   ondansetron (ZOFRAN) 8 MG tablet Take 1 tablet (8 mg total) by mouth every 8 (eight) hours as needed for nausea or vomiting. Start on the third day after chemotherapy. 30 tablet 1   oxyCODONE (OXY IR/ROXICODONE) 5 MG immediate release tablet Take 1 tablet (5 mg total) by mouth every 6 (six) hours as needed for severe pain or moderate pain. 20 tablet 0   polyethylene glycol (MIRALAX / GLYCOLAX) 17 g packet Take 17 g by mouth daily. (Patient taking differently: Take 17 g by mouth daily as needed for mild constipation or moderate constipation.) 14 each 0   prochlorperazine (COMPAZINE) 10 MG tablet Take 1 tablet (10 mg total) by mouth every 6 (six) hours as needed for nausea or vomiting. 30 tablet 1   senna-docusate (SENOKOT-S) 8.6-50 MG tablet Take 2 tablets by mouth 2 (two) times daily. (Patient taking differently: Take 2 tablets by mouth daily as needed for mild constipation or moderate constipation.) 30 tablet 0   No current facility-administered medications for this visit.    REVIEW OF SYSTEMS:   10 Point review of Systems was done is negative except as noted above.  PHYSICAL EXAMINATION: ECOG PERFORMANCE STATUS: 1 - Symptomatic but completely ambulatory  There were no vitals filed for this visit.  GENERAL:alert, in no acute distress and comfortable SKIN: no acute rashes, no significant lesions EYES: conjunctiva are pink and non-injected, sclera anicteric NECK: supple, no JVD LYMPH:   no palpable lymphadenopathy in the cervical, axillary or inguinal regions LUNGS: clear to auscultation b/l with normal respiratory effort HEART: regular rate & rhythm ABDOMEN:  normoactive bowel sounds , non tender, not distended. Extremity: no pedal edema PSYCH: alert & oriented x 3 with fluent speech NEURO: no focal motor/sensory deficits  LABORATORY DATA:  I have reviewed the data as listed    Latest Ref Rng & Units 05/10/2022   11:05 AM 05/08/2022   11:15 AM 04/24/2022   11:14 AM  CBC  WBC 4.0 - 10.5 K/uL 3.1  2.7  8.9   Hemoglobin 13.0 - 17.0 g/dL 04.5  40.9  81.1   Hematocrit 39.0 - 52.0 % 33.9  34.4  36.3   Platelets 150 -  400 K/uL 211  178  246       Latest Ref Rng & Units 05/10/2022   11:05 AM 05/08/2022   11:15 AM 04/24/2022   11:14 AM  CMP  Glucose 70 - 99 mg/dL 161  096  045   BUN 6 - 20 mg/dL 8  9  6    Creatinine 0.61 - 1.24 mg/dL 4.09  8.11  9.14   Sodium 135 - 145 mmol/L 140  139  136   Potassium 3.5 - 5.1 mmol/L 3.5  3.8  3.5   Chloride 98 - 111 mmol/L 106  105  98   CO2 22 - 32 mmol/L 28  28  29    Calcium 8.9 - 10.3 mg/dL 9.5  9.7  9.8   Total Protein 6.5 - 8.1 g/dL 7.1  7.4  7.9   Total Bilirubin 0.3 - 1.2 mg/dL 0.4  0.7  0.6   Alkaline Phos 38 - 126 U/L 247  349  284   AST 15 - 41 U/L 118  586  42   ALT 0 - 44 U/L 585  1,222  164    Cytology done 07/13/2021 revealed "FINAL MICROSCOPIC DIAGNOSIS:  A. PANCREAS, HEAD, FINE NEEDLE ASPIRATION:  - Malignant cells present  - Poorly differentiated/high-grade neuroendocrine carcinoma (see  comment)   B. CBD STRICTURE, BRUSHING:  - Atypical cells suspicious for tumor   C. BILIARY DILATION, BALLOON, REMOVAL:  - Atypical cells suspicious for tumor "  Surgical pathology done 07/13/2021 revealed "FINAL MICROSCOPIC DIAGNOSIS:   A. STOMACH, BIOPSY:  Reactive gastropathy and minimal chronic gastritis with lymphoid  aggregate  Negative for H. pylori, intestinal metaplasia, dysplasia and carcinoma"   Pathology  11/07/21: FINAL MICROSCOPIC DIAGNOSIS:   A. GALLBLADDER, CHOLECYSTECTOMY:  - Acute cholecystitis with necrosis and abscess.   RADIOGRAPHIC STUDIES: I have personally reviewed the radiological images as listed and agreed with the findings in the report. CT Head Wo Contrast  Result Date: 04/18/2022 CLINICAL DATA:  Altered mental status EXAM: CT HEAD WITHOUT CONTRAST TECHNIQUE: Contiguous axial images were obtained from the base of the skull through the vertex without intravenous contrast. RADIATION DOSE REDUCTION: This exam was performed according to the departmental dose-optimization program which includes automated exposure control, adjustment of the mA and/or kV according to patient size and/or use of iterative reconstruction technique. COMPARISON:  01/14/2019 FINDINGS: Brain: There is no mass, hemorrhage or extra-axial collection. The size and configuration of the ventricles and extra-axial CSF spaces are normal. The brain parenchyma is normal, without acute or chronic infarction. Vascular: No abnormal hyperdensity of the major intracranial arteries or dural venous sinuses. No intracranial atherosclerosis. Skull: The visualized skull base, calvarium and extracranial soft tissues are normal. Sinuses/Orbits: No fluid levels or advanced mucosal thickening of the visualized paranasal sinuses. No mastoid or middle ear effusion. The orbits are normal. IMPRESSION: Normal head CT. Electronically Signed   By: Deatra Robinson M.D.   On: 04/18/2022 02:40     ASSESSMENT & PLAN:   32 y.o. very pleasant male with  1. 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 -Cytology done 07/13/2021 shows presence of malignant cells in pancreas and poorly differentiated/high grade neuroendocrine carcinoma. - CT Chest Abdomen Pelvis results from 04/04/2022 with the patient. Showed: No evidence of thoracic metastasis. Interval increase in  size and prominence multifocal hepatic metastasis. Interval increase in peripancreatic and pericaval retroperitoneal lymphadenopathy. No biliary duct dilatation with stent placed. Interval subtle  increase in size of mass in the pancreatic head. Mild pancreatic duct dilatation similar prior.  PLAN: -Labs pending today- will contact patient with results. -Patient wants to hold off from being referred to [facility] for now. He has not been seen by [facility] since our last visit.  -Will proceed with FOLFOX cycle 2 dependent on lab results today.  FOLLOW-UP: ***  The total time spent in the appointment was 15*** minutes* .  All of the patient's questions were answered with apparent satisfaction. The patient knows to call the clinic with any problems, questions or concerns.   Wyvonnia Lora MD MS AAHIVMS [facility] Hematology/Oncology Physician [facility]  *Total Encounter Time as defined by the Centers for Medicare and Medicaid Services includes, in addition to the face-to-face time of a patient visit (documented in the note above) non-face-to-face time: obtaining and reviewing outside history, ordering and reviewing medications, tests or procedures, care coordination (communications with other health care professionals or caregivers) and documentation in the medical record.   I,Alexis Herring,acting as a Neurosurgeon for Wyvonnia Lora, MD.,have documented all relevant documentation on the behalf of Wyvonnia Lora, MD,as directed by  Wyvonnia Lora, MD while in the presence of Wyvonnia Lora, MD.  ***

## 2022-05-16 ENCOUNTER — Encounter: Payer: Self-pay | Admitting: Hematology

## 2022-05-16 ENCOUNTER — Inpatient Hospital Stay: Payer: 59

## 2022-05-16 VITALS — BP 118/92 | HR 90 | Temp 98.4°F | Resp 18

## 2022-05-16 DIAGNOSIS — Z7189 Other specified counseling: Secondary | ICD-10-CM

## 2022-05-16 DIAGNOSIS — C7A1 Malignant poorly differentiated neuroendocrine tumors: Secondary | ICD-10-CM

## 2022-05-16 DIAGNOSIS — Z5111 Encounter for antineoplastic chemotherapy: Secondary | ICD-10-CM | POA: Diagnosis not present

## 2022-05-16 MED ORDER — FLUOROURACIL CHEMO INJECTION 2.5 GM/50ML
400.0000 mg/m2 | Freq: Once | INTRAVENOUS | Status: AC
  Administered 2022-05-16: 1000 mg via INTRAVENOUS
  Filled 2022-05-16: qty 20

## 2022-05-16 MED ORDER — SODIUM CHLORIDE 0.9 % IV SOLN
10.0000 mg | Freq: Once | INTRAVENOUS | Status: AC
  Administered 2022-05-16: 10 mg via INTRAVENOUS
  Filled 2022-05-16: qty 1
  Filled 2022-05-16: qty 10

## 2022-05-16 MED ORDER — PALONOSETRON HCL INJECTION 0.25 MG/5ML
0.2500 mg | Freq: Once | INTRAVENOUS | Status: AC
  Administered 2022-05-16: 0.25 mg via INTRAVENOUS
  Filled 2022-05-16: qty 5

## 2022-05-16 MED ORDER — DEXTROSE 5 % IV SOLN: Freq: Once | INTRAVENOUS | Status: AC

## 2022-05-16 MED ORDER — OXALIPLATIN CHEMO INJECTION 100 MG/20ML
85.0000 mg/m2 | Freq: Once | INTRAVENOUS | Status: AC
  Administered 2022-05-16: 200 mg via INTRAVENOUS
  Filled 2022-05-16: qty 40

## 2022-05-16 MED ORDER — LEUCOVORIN CALCIUM INJECTION 350 MG
400.0000 mg/m2 | Freq: Once | INTRAVENOUS | Status: AC
  Administered 2022-05-16: 1028 mg via INTRAVENOUS
  Filled 2022-05-16: qty 51.4

## 2022-05-16 MED ORDER — SODIUM CHLORIDE 0.9 % IV SOLN
2400.0000 mg/m2 | INTRAVENOUS | Status: DC
  Administered 2022-05-16: 6150 mg via INTRAVENOUS
  Filled 2022-05-16: qty 123

## 2022-05-16 MED ORDER — SODIUM CHLORIDE 0.9% FLUSH
10.0000 mL | INTRAVENOUS | Status: DC | PRN
  Administered 2022-05-16: 10 mL

## 2022-05-16 NOTE — Progress Notes (Signed)
Per Dr. Candise Che OK to proceed with tx today with ANC 1.3 K/uL, elevated AST and ALT, and elevated BP.

## 2022-05-16 NOTE — Patient Instructions (Signed)
[facility]  Discharge Instructions: Thank you for choosing [facility] to provide your oncology and hematology care.   If you have a lab appointment with the Cancer Center, please go directly to the Cancer Center and check in at the registration area.   Wear comfortable clothing and clothing appropriate for easy access to any Portacath or PICC line.   We strive to give you quality time with your provider. You may need to reschedule your appointment if you arrive late (15 or more minutes).  Arriving late affects you and other patients whose appointments are after yours.  Also, if you miss three or more appointments without notifying the office, you may be dismissed from the clinic at the provider's discretion.      For prescription refill requests, have your pharmacy contact our office and allow 72 hours for refills to be completed.    Today you received the following chemotherapy and/or immunotherapy agents: Oxaliplatin/Leucovorin/5FU      To help prevent nausea and vomiting after your treatment, we encourage you to take your nausea medication as directed.  BELOW ARE SYMPTOMS THAT SHOULD BE REPORTED IMMEDIATELY: *FEVER GREATER THAN 100.4 F (38 C) OR HIGHER *CHILLS OR SWEATING *NAUSEA AND VOMITING THAT IS NOT CONTROLLED WITH YOUR NAUSEA MEDICATION *UNUSUAL SHORTNESS OF BREATH *UNUSUAL BRUISING OR BLEEDING *URINARY PROBLEMS (pain or burning when urinating, or frequent urination) *BOWEL PROBLEMS (unusual diarrhea, constipation, pain near the anus) TENDERNESS IN MOUTH AND THROAT WITH OR WITHOUT PRESENCE OF ULCERS (sore throat, sores in mouth, or a toothache) UNUSUAL RASH, SWELLING OR PAIN  UNUSUAL VAGINAL DISCHARGE OR ITCHING   Items with * indicate a potential emergency and should be followed up as soon as possible or go to the Emergency Department if any problems should occur.  Please show the CHEMOTHERAPY ALERT CARD or IMMUNOTHERAPY  ALERT CARD at check-in to the Emergency Department and triage nurse.  Should you have questions after your visit or need to cancel or reschedule your appointment, please contact [facility]  Dept: [phone]  and follow the prompts.  Office hours are 8:00 a.m. to 4:30 p.m. Monday - Friday. Please note that voicemails left after 4:00 p.m. may not be returned until the following business day.  We are closed weekends and major holidays. You have access to a nurse at all times for urgent questions. Please call the main number to the clinic Dept: [phone] and follow the prompts.   For any non-urgent questions, you may also contact your provider using MyChart. We now offer e-Visits for anyone 44 and older to request care online for non-urgent symptoms. For details visit [email or web address].   Also download the MyChart app! Go to the app store, search "MyChart", open the app, select [facility], and log in with your MyChart username and password.

## 2022-05-17 ENCOUNTER — Telehealth: Payer: Self-pay | Admitting: Hematology

## 2022-05-17 NOTE — Telephone Encounter (Signed)
Called patient per 4/11 IB message. Left voicemail with new appointment information and contact details if needing to reschedule.

## 2022-05-18 ENCOUNTER — Inpatient Hospital Stay: Payer: 59

## 2022-05-18 VITALS — BP 137/96 | HR 102 | Temp 97.7°F | Resp 20

## 2022-05-18 DIAGNOSIS — Z7189 Other specified counseling: Secondary | ICD-10-CM

## 2022-05-18 DIAGNOSIS — Z5111 Encounter for antineoplastic chemotherapy: Secondary | ICD-10-CM | POA: Diagnosis not present

## 2022-05-18 DIAGNOSIS — C7A1 Malignant poorly differentiated neuroendocrine tumors: Secondary | ICD-10-CM

## 2022-05-18 MED ORDER — HEPARIN SOD (PORK) LOCK FLUSH 100 UNIT/ML IV SOLN
500.0000 [IU] | Freq: Once | INTRAVENOUS | Status: AC | PRN
  Administered 2022-05-18: 500 [IU]

## 2022-05-18 MED ORDER — SODIUM CHLORIDE 0.9% FLUSH
10.0000 mL | INTRAVENOUS | Status: DC | PRN
  Administered 2022-05-18: 10 mL

## 2022-05-22 ENCOUNTER — Inpatient Hospital Stay: Payer: 59

## 2022-05-22 ENCOUNTER — Inpatient Hospital Stay: Payer: 59 | Admitting: Physician Assistant

## 2022-05-23 ENCOUNTER — Inpatient Hospital Stay: Payer: 59

## 2022-05-23 ENCOUNTER — Other Ambulatory Visit: Payer: Self-pay

## 2022-05-23 DIAGNOSIS — Z7189 Other specified counseling: Secondary | ICD-10-CM

## 2022-05-23 DIAGNOSIS — Z5111 Encounter for antineoplastic chemotherapy: Secondary | ICD-10-CM | POA: Diagnosis not present

## 2022-05-23 DIAGNOSIS — Z95828 Presence of other vascular implants and grafts: Secondary | ICD-10-CM

## 2022-05-23 DIAGNOSIS — C7A1 Malignant poorly differentiated neuroendocrine tumors: Secondary | ICD-10-CM

## 2022-05-23 LAB — CBC WITH DIFFERENTIAL (CANCER CENTER ONLY)
Abs Immature Granulocytes: 0.03 10*3/uL (ref 0.00–0.07)
Basophils Absolute: 0 10*3/uL (ref 0.0–0.1)
Basophils Relative: 0 %
Eosinophils Absolute: 0.1 10*3/uL (ref 0.0–0.5)
Eosinophils Relative: 2 %
HCT: 32.6 % — ABNORMAL LOW (ref 39.0–52.0)
Hemoglobin: 11.2 g/dL — ABNORMAL LOW (ref 13.0–17.0)
Immature Granulocytes: 0 %
Lymphocytes Relative: 19 %
Lymphs Abs: 1.3 10*3/uL (ref 0.7–4.0)
MCH: 29.3 pg (ref 26.0–34.0)
MCHC: 34.4 g/dL (ref 30.0–36.0)
MCV: 85.3 fL (ref 80.0–100.0)
Monocytes Absolute: 0.3 10*3/uL (ref 0.1–1.0)
Monocytes Relative: 5 %
Neutro Abs: 5.2 10*3/uL (ref 1.7–7.7)
Neutrophils Relative %: 74 %
Platelet Count: 225 10*3/uL (ref 150–400)
RBC: 3.82 MIL/uL — ABNORMAL LOW (ref 4.22–5.81)
RDW: 16 % — ABNORMAL HIGH (ref 11.5–15.5)
WBC Count: 7 10*3/uL (ref 4.0–10.5)
nRBC: 0 % (ref 0.0–0.2)

## 2022-05-23 LAB — CMP (CANCER CENTER ONLY)
ALT: 128 U/L — ABNORMAL HIGH (ref 0–44)
AST: 30 U/L (ref 15–41)
Albumin: 4.1 g/dL (ref 3.5–5.0)
Alkaline Phosphatase: 216 U/L — ABNORMAL HIGH (ref 38–126)
Anion gap: 6 (ref 5–15)
BUN: 14 mg/dL (ref 6–20)
CO2: 28 mmol/L (ref 22–32)
Calcium: 9.3 mg/dL (ref 8.9–10.3)
Chloride: 105 mmol/L (ref 98–111)
Creatinine: 0.61 mg/dL (ref 0.61–1.24)
GFR, Estimated: >60 mL/min (ref >60)
Glucose, Bld: 129 mg/dL — ABNORMAL HIGH (ref 70–99)
Potassium: 3.4 mmol/L — ABNORMAL LOW (ref 3.5–5.1)
Sodium: 139 mmol/L (ref 135–145)
Total Bilirubin: 0.4 mg/dL (ref 0.3–1.2)
Total Protein: 7.2 g/dL (ref 6.5–8.1)

## 2022-05-23 MED ORDER — SODIUM CHLORIDE 0.9% FLUSH
10.0000 mL | Freq: Once | INTRAVENOUS | Status: AC
  Administered 2022-05-23: 10 mL

## 2022-05-23 MED ORDER — HEPARIN SOD (PORK) LOCK FLUSH 100 UNIT/ML IV SOLN
500.0000 [IU] | Freq: Once | INTRAVENOUS | Status: AC
  Administered 2022-05-23: 500 [IU]

## 2022-05-24 ENCOUNTER — Inpatient Hospital Stay: Payer: 59

## 2022-05-27 ENCOUNTER — Other Ambulatory Visit: Payer: Self-pay

## 2022-05-27 DIAGNOSIS — C7A1 Malignant poorly differentiated neuroendocrine tumors: Secondary | ICD-10-CM

## 2022-05-28 ENCOUNTER — Other Ambulatory Visit: Payer: Self-pay

## 2022-05-28 ENCOUNTER — Inpatient Hospital Stay (HOSPITAL_BASED_OUTPATIENT_CLINIC_OR_DEPARTMENT_OTHER): Payer: 59 | Admitting: Hematology

## 2022-05-28 ENCOUNTER — Inpatient Hospital Stay: Payer: 59

## 2022-05-28 VITALS — BP 141/94 | HR 99 | Temp 98.5°F | Resp 20 | Wt 282.0 lb

## 2022-05-28 DIAGNOSIS — Z7189 Other specified counseling: Secondary | ICD-10-CM

## 2022-05-28 DIAGNOSIS — C7A1 Malignant poorly differentiated neuroendocrine tumors: Secondary | ICD-10-CM | POA: Diagnosis not present

## 2022-05-28 DIAGNOSIS — Z95828 Presence of other vascular implants and grafts: Secondary | ICD-10-CM

## 2022-05-28 DIAGNOSIS — Z5111 Encounter for antineoplastic chemotherapy: Secondary | ICD-10-CM | POA: Diagnosis not present

## 2022-05-28 LAB — CMP (CANCER CENTER ONLY)
ALT: 544 U/L (ref 0–44)
AST: 193 U/L (ref 15–41)
Albumin: 4 g/dL (ref 3.5–5.0)
Alkaline Phosphatase: 407 U/L — ABNORMAL HIGH (ref 38–126)
Anion gap: 7 (ref 5–15)
BUN: 10 mg/dL (ref 6–20)
CO2: 27 mmol/L (ref 22–32)
Calcium: 9.6 mg/dL (ref 8.9–10.3)
Chloride: 106 mmol/L (ref 98–111)
Creatinine: 0.64 mg/dL (ref 0.61–1.24)
GFR, Estimated: >60 mL/min (ref >60)
Glucose, Bld: 130 mg/dL — ABNORMAL HIGH (ref 70–99)
Potassium: 3.6 mmol/L (ref 3.5–5.1)
Sodium: 140 mmol/L (ref 135–145)
Total Bilirubin: 0.6 mg/dL (ref 0.3–1.2)
Total Protein: 7.4 g/dL (ref 6.5–8.1)

## 2022-05-28 LAB — CBC WITH DIFFERENTIAL (CANCER CENTER ONLY)
Abs Immature Granulocytes: 0.02 10*3/uL (ref 0.00–0.07)
Basophils Absolute: 0 10*3/uL (ref 0.0–0.1)
Basophils Relative: 0 %
Eosinophils Absolute: 0.3 10*3/uL (ref 0.0–0.5)
Eosinophils Relative: 6 %
HCT: 34.2 % — ABNORMAL LOW (ref 39.0–52.0)
Hemoglobin: 11.4 g/dL — ABNORMAL LOW (ref 13.0–17.0)
Immature Granulocytes: 0 %
Lymphocytes Relative: 16 %
Lymphs Abs: 0.8 10*3/uL (ref 0.7–4.0)
MCH: 29 pg (ref 26.0–34.0)
MCHC: 33.3 g/dL (ref 30.0–36.0)
MCV: 87 fL (ref 80.0–100.0)
Monocytes Absolute: 0.7 10*3/uL (ref 0.1–1.0)
Monocytes Relative: 13 %
Neutro Abs: 3.5 10*3/uL (ref 1.7–7.7)
Neutrophils Relative %: 65 %
Platelet Count: 177 10*3/uL (ref 150–400)
RBC: 3.93 MIL/uL — ABNORMAL LOW (ref 4.22–5.81)
RDW: 16.9 % — ABNORMAL HIGH (ref 11.5–15.5)
WBC Count: 5.4 10*3/uL (ref 4.0–10.5)
nRBC: 0 % (ref 0.0–0.2)

## 2022-05-28 MED ORDER — HEPARIN SOD (PORK) LOCK FLUSH 100 UNIT/ML IV SOLN
500.0000 [IU] | Freq: Once | INTRAVENOUS | Status: AC
  Administered 2022-05-28: 500 [IU]

## 2022-05-28 MED ORDER — SODIUM CHLORIDE 0.9% FLUSH
10.0000 mL | Freq: Once | INTRAVENOUS | Status: AC
  Administered 2022-05-28: 10 mL

## 2022-05-28 MED FILL — Dexamethasone Sodium Phosphate Inj 100 MG/10ML: INTRAMUSCULAR | Qty: 1 | Status: AC

## 2022-05-28 NOTE — Progress Notes (Signed)
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  HISTORY OF PRESENTING ILLNESS:  Leon Fischer. is a wonderful 32 y.o. 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 He presents today accompanied by his mother. He reports He is doing well.  He reports no color change in stool.  He notes that he is eating well but feels food just sits in stomach.  We discussed getting additional scans and labs for further evaluation and treatment which he was agreeable to. We further discussed getting MRI of brain and PET scan which he was agreeable to.  He notes that he is trying to hold it together mentally and is still trying to process his new diagnosis. We discussed the options of psychological or spiritual counseling which he has not decided on at this time. He was advised that may call back if he decides that he would like to pursue either.  We discussed getting a port a cath installed which he was agreeable to.  He recently had a biliary stent put into common bile duct.   No abdominal pain or change in bowel habits. No new or unexpected weight loss. No other new or acute focal symptoms.  We discussed his recent stomach biopsy done 07/13/2021 which sowed gastropathy and minor chronic gastritis with lymphoid aggregate. Negative for H. Pylori.  We discussed recent cytology done 07/13/2021 which showed presence of malignant cells in pancreas and poorly differentiated/high grade neuroendocrine carcinoma.  We discussed upper endoscopy done 07/13/2021 revealed mass in pancreatic head and  some enlarged lymph nodes were seen in the celiac and peripancreatic regions.  Labs done today were reviewed in detail.  INTERVAL HISTORY: Leon Fischer. Is a 32 y.o. 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   Patient was last seen by me on 05/16/22. He was doing well and tolerated his first treatment okay apart from a few days of cold intolerance with eating/drinking.  Today, he states that he has been doing very well overall. He tolerated his most recent treatment well. He states that he is eating good and has gained some weight. He denies fevers or recurrent chills. He denies any abdominal pain. He denies nausea, vomiting, chest pain, dyspnea or cough.   MEDICAL HISTORY:  Past Medical History:  Diagnosis Date   Asthma    Neuroendocrine cancer (HCC)     SURGICAL HISTORY: Past Surgical History:  Procedure Laterality Date   BILIARY DILATION  07/13/2021   Procedure: BILIARY DILATION;  Surgeon: Vida Rigger, MD;  Location: [facility];  Service: Gastroenterology;;   BILIARY STENT PLACEMENT N/A 07/13/2021   Procedure: BILIARY STENT PLACEMENT;  Surgeon: Vida Rigger, MD;  Location: [name];  Service: Gastroenterology;  Laterality: N/A;   BILIARY STENT PLACEMENT N/A 11/05/2021   Procedure: BILIARY STENT PLACEMENT;  Surgeon: Vida Rigger, MD;  Location: [name];  Service: Gastroenterology;  Laterality: N/A;   BIOPSY  07/13/2021   Procedure: BIOPSY;  Surgeon: Meridee Score Netty Starring., MD;  Location: [facility];  Service: Gastroenterology;;   CHOLECYSTECTOMY N/A 11/07/2021   Procedure: LAPAROSCOPIC CHOLECYSTECTOMY;  Surgeon: Berna Bue, MD;  Location: [facility];  Service: General;  Laterality: N/A;   ENDOSCOPIC RETROGRADE CHOLANGIOPANCREATOGRAPHY (ERCP) WITH PROPOFOL N/A 07/13/2021   Procedure: ENDOSCOPIC RETROGRADE CHOLANGIOPANCREATOGRAPHY (ERCP) WITH PROPOFOL;  Surgeon: Vida Rigger, MD;  Location: [facility];  Service: Gastroenterology;  Laterality: N/A;   ERCP N/A 11/05/2021   Procedure: ENDOSCOPIC RETROGRADE CHOLANGIOPANCREATOGRAPHY (ERCP);  Surgeon: Vida Rigger, MD;  Location: [facility];  Service: Gastroenterology;  Laterality: N/A;   ESOPHAGOGASTRODUODENOSCOPY (EGD) WITH PROPOFOL N/A 07/13/2021   Procedure: ESOPHAGOGASTRODUODENOSCOPY (EGD) WITH PROPOFOL;  Surgeon: Meridee Score Netty Starring., MD;  Location: [name];  Service: Gastroenterology;  Laterality: N/A;   EUS N/A 07/13/2021   Procedure: UPPER ENDOSCOPIC ULTRASOUND (EUS) LINEAR;  Surgeon: Lemar Lofty., MD;  Location: [name];  Service: Gastroenterology;  Laterality: N/A;   FINE NEEDLE ASPIRATION  07/13/2021   Procedure: FINE NEEDLE ASPIRATION (FNA) LINEAR;  Surgeon: Lemar Lofty., MD;  Location: [facility];  Service: Gastroenterology;;   IR IMAGING GUIDED PORT INSERTION  07/31/2021   SPHINCTEROTOMY  07/13/2021   Procedure: Dennison Mascot;  Surgeon: Vida Rigger, MD;  Location: [name];  Service: Gastroenterology;;    SOCIAL HISTORY: Social History   Socioeconomic History   Marital status: Single    Spouse name: Not on file   Number of children: 0   Years of education: Not on file   Highest education level: Some college, no degree  Occupational History   Not on file  Tobacco Use   Smoking status: Some Days    Types: Cigarettes   Smokeless tobacco: Never  Vaping Use   Vaping Use: Never used  Substance and Sexual Activity   Alcohol use: Yes    Comment: social   Drug use: No   Sexual activity: Never  Other Topics Concern   Not on file  Social History Narrative   Not on file   Social Determinants of Health   Financial Resource Strain: Low Risk  (04/14/2017)   Overall Financial Resource Strain (CARDIA)    Difficulty of Paying Living Expenses: Not hard at all  Food Insecurity: No Food Insecurity (11/07/2021)   Hunger Vital Sign    Worried About Running Out of Food in the Last Year:  Never true    Ran Out of Food in the Last Year: Never true  Transportation Needs: No Transportation Needs (11/07/2021)   PRAPARE - Administrator, Civil Service (Medical): No    Lack of Transportation (Non-Medical): No  Physical Activity: Inactive (04/14/2017)   Exercise Vital Sign    Days of Exercise per Week: 0 days    Minutes of Exercise per Session: 0 min  Stress: Stress Concern Present (04/14/2017)   [name] of Occupational Health - Occupational Stress Questionnaire    Feeling of Stress : Rather much  Social Connections: Moderately Isolated (04/14/2017)   Social Connection and Isolation Panel [NHANES]    Frequency of Communication with Friends and Family: More than three times a week    Frequency of Social Gatherings with Friends and Family: More than three times a week    Attends Religious Services: Never    Database administrator or Organizations: No    Attends Banker Meetings: Never    Marital Status: Never married  Intimate Partner Violence: Not At Risk (11/07/2021)   Humiliation, Afraid, Rape, and Kick questionnaire    Fear of Current or Ex-Partner: No    Emotionally Abused: No    Physically Abused: No    Sexually Abused: No    FAMILY HISTORY: Family History  Problem Relation  Age of Onset   Drug abuse Father    ALLERGIES:  is allergic to shellfish allergy and other.  MEDICATIONS:  Current Outpatient Medications  Medication Sig Dispense Refill   acetaminophen (TYLENOL) 500 MG tablet Take 1,000 mg by mouth every 6 (six) hours as needed for mild pain.     albuterol (VENTOLIN HFA) 108 (90 Base) MCG/ACT inhaler INHALE 2 PUFFS BY MOUTH EVERY 4 HOURS AS NEEDED FOR WHEEZING FOR SHORTNESS OF BREATH 18 g 0   dexamethasone (DECADRON) 4 MG tablet Take 2 tablets (8 mg total) by mouth daily. Start the day after chemotherapy for 2 days. Take with food. 30 tablet 1   diclofenac Sodium (VOLTAREN) 1 % GEL Apply 4 g topically 4 (four) times daily.  (Patient taking differently: Apply 4 g topically 4 (four) times daily as needed (pain).) 100 g 0   lidocaine-prilocaine (EMLA) cream Apply to affected area once 30 g 3   methocarbamol (ROBAXIN) 500 MG tablet Take 1 tablet (500 mg total) by mouth 2 (two) times daily. 20 tablet 0   ondansetron (ZOFRAN) 8 MG tablet Take 1 tablet (8 mg total) by mouth every 8 (eight) hours as needed for nausea or vomiting. Start on the third day after chemotherapy. 30 tablet 1   oxyCODONE (OXY IR/ROXICODONE) 5 MG immediate release tablet Take 1 tablet (5 mg total) by mouth every 6 (six) hours as needed for severe pain or moderate pain. 20 tablet 0   polyethylene glycol (MIRALAX / GLYCOLAX) 17 g packet Take 17 g by mouth daily. (Patient taking differently: Take 17 g by mouth daily as needed for mild constipation or moderate constipation.) 14 each 0   prochlorperazine (COMPAZINE) 10 MG tablet Take 1 tablet (10 mg total) by mouth every 6 (six) hours as needed for nausea or vomiting. 30 tablet 1   senna-docusate (SENOKOT-S) 8.6-50 MG tablet Take 2 tablets by mouth 2 (two) times daily. (Patient taking differently: Take 2 tablets by mouth daily as needed for mild constipation or moderate constipation.) 30 tablet 0   No current facility-administered medications for this visit.    REVIEW OF SYSTEMS:   10 Point review of Systems was done is negative except as noted above.  PHYSICAL EXAMINATION: ECOG PERFORMANCE STATUS: 1 - Symptomatic but completely ambulatory  Vitals:   05/28/22 1228  BP: (!) 141/94  Pulse: 99  Resp: 20  Temp: 98.5 F (36.9 C)  SpO2: 100%   GENERAL:alert, in no acute distress and comfortable SKIN: no acute rashes, no significant lesions EYES: conjunctiva are pink and non-injected, sclera anicteric NECK: supple, no JVD LYMPH:  no palpable lymphadenopathy in the cervical, axillary or inguinal regions LUNGS: clear to auscultation b/l with normal respiratory effort HEART: regular rate &  rhythm ABDOMEN:  normoactive bowel sounds , non tender, not distended. Extremity: no pedal edema PSYCH: alert & oriented x 3 with fluent speech NEURO: no focal motor/sensory deficits  LABORATORY DATA:  I have reviewed the data as listed    Latest Ref Rng & Units 05/28/2022   12:06 PM 05/23/2022   12:47 PM 05/15/2022    9:58 AM  CBC  WBC 4.0 - 10.5 K/uL 5.4  7.0  3.1   Hemoglobin 13.0 - 17.0 g/dL 16.1  09.6  04.5   Hematocrit 39.0 - 52.0 % 34.2  32.6  34.5   Platelets 150 - 400 K/uL 177  225  302       Latest Ref Rng & Units 05/28/2022   12:06 PM  05/23/2022   12:47 PM 05/15/2022    9:58 AM  CMP  Glucose 70 - 99 mg/dL 409  811  914   BUN 6 - 20 mg/dL 10  14  7    Creatinine 0.61 - 1.24 mg/dL 7.82  9.56  2.13   Sodium 135 - 145 mmol/L 140  139  140   Potassium 3.5 - 5.1 mmol/L 3.6  3.4  3.5   Chloride 98 - 111 mmol/L 106  105  106   CO2 22 - 32 mmol/L 27  28  29    Calcium 8.9 - 10.3 mg/dL 9.6  9.3  9.6   Total Protein 6.5 - 8.1 g/dL 7.4  7.2  7.4   Total Bilirubin 0.3 - 1.2 mg/dL 0.6  0.4  0.5   Alkaline Phos 38 - 126 U/L 407  216  370   AST 15 - 41 U/L 193  30  129   ALT 0 - 44 U/L 544  128  400    Cytology done 07/13/2021 revealed "FINAL MICROSCOPIC DIAGNOSIS:  A. PANCREAS, HEAD, FINE NEEDLE ASPIRATION:  - Malignant cells present  - Poorly differentiated/high-grade neuroendocrine carcinoma (see  comment)   B. CBD STRICTURE, BRUSHING:  - Atypical cells suspicious for tumor   C. BILIARY DILATION, BALLOON, REMOVAL:  - Atypical cells suspicious for tumor "  Surgical pathology done 07/13/2021 revealed "FINAL MICROSCOPIC DIAGNOSIS:   A. STOMACH, BIOPSY:  Reactive gastropathy and minimal chronic gastritis with lymphoid  aggregate  Negative for H. pylori, intestinal metaplasia, dysplasia and carcinoma"   Pathology 11/07/21: FINAL MICROSCOPIC DIAGNOSIS:   A. GALLBLADDER, CHOLECYSTECTOMY:  - Acute cholecystitis with necrosis and abscess.   RADIOGRAPHIC STUDIES: I have  personally reviewed the radiological images as listed and agreed with the findings in the report. No results found.   ASSESSMENT & PLAN:   32 y.o. very pleasant male with  1. 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 -Cytology done 07/13/2021 shows presence of malignant cells in pancreas and poorly differentiated/high grade neuroendocrine carcinoma. - CT Chest Abdomen Pelvis results from 04/04/2022 with the patient. Showed: No evidence of thoracic metastasis. Interval increase in size and prominence multifocal hepatic metastasis. Interval increase in peripancreatic and pericaval retroperitoneal lymphadenopathy. No biliary duct dilatation with stent placed. Interval subtle increase in size of mass in the pancreatic head. Mild pancreatic duct dilatation similar prior.  PLAN: -Patient notes nonew acute toxicities from his second cycle of FOLFOX chemotherapy.   -no New focal symptoms no abdominal pain no fevers no chills no night sweats no new shortness of breath. -He does have elevation of his liver enzymes even prior to starting the FOLFOX and a bump in his liver enzymes including AST ALT and alkaline phosphatase after his 1st for cycle of FOLFOX leading to delay in his second cycle of chemotherapy.  His liver enzymes are again higher after C2 of FOLFOX -We will proceed have to delay cycle 3 of FOLFOX for 1 week and monitor LFTs -[place] abd to evaluate liver findings -CMP otherwise shows normal renal function with creatinine of 0.6  -CBC shows improved WBC of 5.4K with an ANC of 3.5k, hemoglobin of 11.4, and platelets of 177k. -Will proceed with treatment and monitor for any worsening leukopenia's that might require growth factor support with subsequent treatments.  FOLLOW-UP: Reschedule C3 of FOLFOX by 1 week  .The total time spent in the appointment was 21 minutes* .  All of  the patient's questions were  answered with apparent satisfaction. The patient knows to call the clinic with any problems, questions or concerns.   Wyvonnia Lora MD MS AAHIVMS [facility] Hematology/Oncology Physician [facility]  .*Total Encounter Time as defined by the Centers for Medicare and Medicaid Services includes, in addition to the face-to-face time of a patient visit (documented in the note above) non-face-to-face time: obtaining and reviewing outside history, ordering and reviewing medications, tests or procedures, care coordination (communications with other health care professionals or caregivers) and documentation in the medical record.   I,Alexis Herring,acting as a Neurosurgeon for Wyvonnia Lora, MD.,have documented all relevant documentation on the behalf of Wyvonnia Lora, MD,as directed by  Wyvonnia Lora, MD while in the presence of Wyvonnia Lora, MD.  .I have reviewed the above documentation for accuracy and completeness, and I agree with the above. [name] MD

## 2022-05-28 NOTE — Progress Notes (Signed)
CRITICAL VALUE STICKER  CRITICAL VALUE: AST: 193, ALT: 544  DATE & TIME NOTIFIED: 1:10 05/28/22  MD NOTIFIED: Candise Che  RESPONSE:  Dr Candise Che to review all labs and discuss with pt.

## 2022-05-29 ENCOUNTER — Other Ambulatory Visit: Payer: Self-pay

## 2022-05-29 ENCOUNTER — Inpatient Hospital Stay: Payer: 59

## 2022-05-30 ENCOUNTER — Other Ambulatory Visit: Payer: Self-pay

## 2022-05-31 ENCOUNTER — Inpatient Hospital Stay: Payer: 59

## 2022-06-03 ENCOUNTER — Encounter: Payer: Self-pay | Admitting: Hematology

## 2022-06-04 MED FILL — Dexamethasone Sodium Phosphate Inj 100 MG/10ML: INTRAMUSCULAR | Qty: 1 | Status: AC

## 2022-06-05 ENCOUNTER — Inpatient Hospital Stay: Payer: 59 | Attending: Hematology

## 2022-06-05 ENCOUNTER — Inpatient Hospital Stay: Payer: 59

## 2022-06-05 ENCOUNTER — Other Ambulatory Visit: Payer: Self-pay

## 2022-06-05 VITALS — BP 151/98 | HR 88 | Temp 98.2°F | Resp 16 | Ht 72.0 in | Wt 277.8 lb

## 2022-06-05 DIAGNOSIS — Z5111 Encounter for antineoplastic chemotherapy: Secondary | ICD-10-CM | POA: Diagnosis present

## 2022-06-05 DIAGNOSIS — C7A8 Other malignant neuroendocrine tumors: Secondary | ICD-10-CM | POA: Insufficient documentation

## 2022-06-05 DIAGNOSIS — C7A1 Malignant poorly differentiated neuroendocrine tumors: Secondary | ICD-10-CM

## 2022-06-05 DIAGNOSIS — Z95828 Presence of other vascular implants and grafts: Secondary | ICD-10-CM

## 2022-06-05 DIAGNOSIS — C7B8 Other secondary neuroendocrine tumors: Secondary | ICD-10-CM | POA: Insufficient documentation

## 2022-06-05 DIAGNOSIS — Z7189 Other specified counseling: Secondary | ICD-10-CM

## 2022-06-05 LAB — CMP (CANCER CENTER ONLY)
ALT: 235 U/L — ABNORMAL HIGH (ref 0–44)
AST: 44 U/L — ABNORMAL HIGH (ref 15–41)
Albumin: 4.1 g/dL (ref 3.5–5.0)
Alkaline Phosphatase: 372 U/L — ABNORMAL HIGH (ref 38–126)
Anion gap: 6 (ref 5–15)
BUN: 12 mg/dL (ref 6–20)
CO2: 28 mmol/L (ref 22–32)
Calcium: 9.4 mg/dL (ref 8.9–10.3)
Chloride: 105 mmol/L (ref 98–111)
Creatinine: 0.71 mg/dL (ref 0.61–1.24)
GFR, Estimated: >60 mL/min (ref >60)
Glucose, Bld: 99 mg/dL (ref 70–99)
Potassium: 3.7 mmol/L (ref 3.5–5.1)
Sodium: 139 mmol/L (ref 135–145)
Total Bilirubin: 0.5 mg/dL (ref 0.3–1.2)
Total Protein: 7.6 g/dL (ref 6.5–8.1)

## 2022-06-05 LAB — CBC WITH DIFFERENTIAL (CANCER CENTER ONLY)
Abs Immature Granulocytes: 0.01 10*3/uL (ref 0.00–0.07)
Basophils Absolute: 0 10*3/uL (ref 0.0–0.1)
Basophils Relative: 1 %
Eosinophils Absolute: 0.2 10*3/uL (ref 0.0–0.5)
Eosinophils Relative: 5 %
HCT: 34.5 % — ABNORMAL LOW (ref 39.0–52.0)
Hemoglobin: 11.5 g/dL — ABNORMAL LOW (ref 13.0–17.0)
Immature Granulocytes: 0 %
Lymphocytes Relative: 26 %
Lymphs Abs: 1.2 10*3/uL (ref 0.7–4.0)
MCH: 29.9 pg (ref 26.0–34.0)
MCHC: 33.3 g/dL (ref 30.0–36.0)
MCV: 89.8 fL (ref 80.0–100.0)
Monocytes Absolute: 0.7 10*3/uL (ref 0.1–1.0)
Monocytes Relative: 16 %
Neutro Abs: 2.3 10*3/uL (ref 1.7–7.7)
Neutrophils Relative %: 52 %
Platelet Count: 269 10*3/uL (ref 150–400)
RBC: 3.84 MIL/uL — ABNORMAL LOW (ref 4.22–5.81)
RDW: 17.1 % — ABNORMAL HIGH (ref 11.5–15.5)
WBC Count: 4.4 10*3/uL (ref 4.0–10.5)
nRBC: 0 % (ref 0.0–0.2)

## 2022-06-05 MED ORDER — PALONOSETRON HCL INJECTION 0.25 MG/5ML
0.2500 mg | Freq: Once | INTRAVENOUS | Status: AC
  Administered 2022-06-05: 0.25 mg via INTRAVENOUS
  Filled 2022-06-05: qty 5

## 2022-06-05 MED ORDER — LEUCOVORIN CALCIUM INJECTION 350 MG
400.0000 mg/m2 | Freq: Once | INTRAVENOUS | Status: AC
  Administered 2022-06-05: 1028 mg via INTRAVENOUS
  Filled 2022-06-05: qty 33.9

## 2022-06-05 MED ORDER — SODIUM CHLORIDE 0.9 % IV SOLN
10.0000 mg | Freq: Once | INTRAVENOUS | Status: AC
  Administered 2022-06-05: 10 mg via INTRAVENOUS
  Filled 2022-06-05: qty 1
  Filled 2022-06-05: qty 10

## 2022-06-05 MED ORDER — DEXTROSE 5 % IV SOLN: Freq: Once | INTRAVENOUS | Status: AC

## 2022-06-05 MED ORDER — FLUOROURACIL CHEMO INJECTION 2.5 GM/50ML
400.0000 mg/m2 | Freq: Once | INTRAVENOUS | Status: AC
  Administered 2022-06-05: 1000 mg via INTRAVENOUS
  Filled 2022-06-05: qty 20

## 2022-06-05 MED ORDER — OXALIPLATIN CHEMO INJECTION 100 MG/20ML
85.0000 mg/m2 | Freq: Once | INTRAVENOUS | Status: AC
  Administered 2022-06-05: 200 mg via INTRAVENOUS
  Filled 2022-06-05: qty 40

## 2022-06-05 MED ORDER — SODIUM CHLORIDE 0.9% FLUSH
10.0000 mL | Freq: Once | INTRAVENOUS | Status: AC
  Administered 2022-06-05: 10 mL

## 2022-06-05 MED ORDER — SODIUM CHLORIDE 0.9 % IV SOLN
2400.0000 mg/m2 | INTRAVENOUS | Status: DC
  Administered 2022-06-05: 6150 mg via INTRAVENOUS
  Filled 2022-06-05: qty 123

## 2022-06-05 NOTE — Progress Notes (Signed)
Per Dr Candise Che pt is ok for tx today with ALT of 235

## 2022-06-05 NOTE — Patient Instructions (Signed)
[facility]  Discharge Instructions: Thank you for choosing [facility] to provide your oncology and hematology care.   If you have a lab appointment with the Cancer Center, please go directly to the Cancer Center and check in at the registration area.   Wear comfortable clothing and clothing appropriate for easy access to any Portacath or PICC line.   We strive to give you quality time with your provider. You may need to reschedule your appointment if you arrive late (15 or more minutes).  Arriving late affects you and other patients whose appointments are after yours.  Also, if you miss three or more appointments without notifying the office, you may be dismissed from the clinic at the provider's discretion.      For prescription refill requests, have your pharmacy contact our office and allow 72 hours for refills to be completed.    Today you received the following chemotherapy and/or immunotherapy agents: oxaliplatin, leucovorin, fluorouracil      To help prevent nausea and vomiting after your treatment, we encourage you to take your nausea medication as directed.  BELOW ARE SYMPTOMS THAT SHOULD BE REPORTED IMMEDIATELY: *FEVER GREATER THAN 100.4 F (38 C) OR HIGHER *CHILLS OR SWEATING *NAUSEA AND VOMITING THAT IS NOT CONTROLLED WITH YOUR NAUSEA MEDICATION *UNUSUAL SHORTNESS OF BREATH *UNUSUAL BRUISING OR BLEEDING *URINARY PROBLEMS (pain or burning when urinating, or frequent urination) *BOWEL PROBLEMS (unusual diarrhea, constipation, pain near the anus) TENDERNESS IN MOUTH AND THROAT WITH OR WITHOUT PRESENCE OF ULCERS (sore throat, sores in mouth, or a toothache) UNUSUAL RASH, SWELLING OR PAIN  UNUSUAL VAGINAL DISCHARGE OR ITCHING   Items with * indicate a potential emergency and should be followed up as soon as possible or go to the Emergency Department if any problems should occur.  Please show the CHEMOTHERAPY ALERT CARD or  IMMUNOTHERAPY ALERT CARD at check-in to the Emergency Department and triage nurse.  Should you have questions after your visit or need to cancel or reschedule your appointment, please contact [facility]  Dept: [phone]  and follow the prompts.  Office hours are 8:00 a.m. to 4:30 p.m. Monday - Friday. Please note that voicemails left after 4:00 p.m. may not be returned until the following business day.  We are closed weekends and major holidays. You have access to a nurse at all times for urgent questions. Please call the main number to the clinic Dept: [phone] and follow the prompts.   For any non-urgent questions, you may also contact your provider using MyChart. We now offer e-Visits for anyone 61 and older to request care online for non-urgent symptoms. For details visit [email or web address].   Also download the MyChart app! Go to the app store, search "MyChart", open the app, select [place], and log in with your MyChart username and password.

## 2022-06-06 ENCOUNTER — Other Ambulatory Visit: Payer: Self-pay

## 2022-06-07 ENCOUNTER — Other Ambulatory Visit: Payer: Self-pay

## 2022-06-07 ENCOUNTER — Inpatient Hospital Stay: Payer: 59

## 2022-06-07 VITALS — BP 114/79 | HR 73 | Temp 98.5°F | Resp 18

## 2022-06-07 DIAGNOSIS — Z7189 Other specified counseling: Secondary | ICD-10-CM

## 2022-06-07 DIAGNOSIS — C7A1 Malignant poorly differentiated neuroendocrine tumors: Secondary | ICD-10-CM

## 2022-06-07 DIAGNOSIS — Z5111 Encounter for antineoplastic chemotherapy: Secondary | ICD-10-CM | POA: Diagnosis not present

## 2022-06-07 MED ORDER — HEPARIN SOD (PORK) LOCK FLUSH 100 UNIT/ML IV SOLN
500.0000 [IU] | Freq: Once | INTRAVENOUS | Status: AC | PRN
  Administered 2022-06-07: 500 [IU]

## 2022-06-07 MED ORDER — SODIUM CHLORIDE 0.9% FLUSH
10.0000 mL | INTRAVENOUS | Status: DC | PRN
  Administered 2022-06-07: 10 mL

## 2022-06-08 ENCOUNTER — Ambulatory Visit (HOSPITAL_COMMUNITY)
Admission: RE | Admit: 2022-06-08 | Discharge: 2022-06-08 | Disposition: A | Discharge status: Home / Self Care | Payer: 59 | Source: Ambulatory Visit | Attending: Hematology | Admitting: Hematology

## 2022-06-08 DIAGNOSIS — C7A1 Malignant poorly differentiated neuroendocrine tumors: Secondary | ICD-10-CM

## 2022-06-12 ENCOUNTER — Other Ambulatory Visit: Payer: 59

## 2022-06-12 ENCOUNTER — Ambulatory Visit: Payer: 59

## 2022-06-13 ENCOUNTER — Other Ambulatory Visit: Payer: Self-pay

## 2022-06-13 DIAGNOSIS — C7A1 Malignant poorly differentiated neuroendocrine tumors: Secondary | ICD-10-CM

## 2022-06-18 MED FILL — Dexamethasone Sodium Phosphate Inj 100 MG/10ML: INTRAMUSCULAR | Qty: 1 | Status: AC

## 2022-06-19 ENCOUNTER — Inpatient Hospital Stay (HOSPITAL_BASED_OUTPATIENT_CLINIC_OR_DEPARTMENT_OTHER): Payer: 59 | Admitting: Hematology

## 2022-06-19 ENCOUNTER — Other Ambulatory Visit: Payer: Self-pay

## 2022-06-19 ENCOUNTER — Inpatient Hospital Stay: Payer: 59

## 2022-06-19 VITALS — BP 126/86 | HR 88 | Temp 97.9°F | Resp 18 | Ht 72.0 in | Wt 283.7 lb

## 2022-06-19 DIAGNOSIS — C7A1 Malignant poorly differentiated neuroendocrine tumors: Secondary | ICD-10-CM

## 2022-06-19 DIAGNOSIS — Z7189 Other specified counseling: Secondary | ICD-10-CM

## 2022-06-19 DIAGNOSIS — Z5111 Encounter for antineoplastic chemotherapy: Secondary | ICD-10-CM | POA: Diagnosis not present

## 2022-06-19 DIAGNOSIS — Z95828 Presence of other vascular implants and grafts: Secondary | ICD-10-CM

## 2022-06-19 LAB — CBC WITH DIFFERENTIAL (CANCER CENTER ONLY)
Abs Immature Granulocytes: 0.01 10*3/uL (ref 0.00–0.07)
Basophils Absolute: 0 10*3/uL (ref 0.0–0.1)
Basophils Relative: 0 %
Eosinophils Absolute: 0.2 10*3/uL (ref 0.0–0.5)
Eosinophils Relative: 4 %
HCT: 34.4 % — ABNORMAL LOW (ref 39.0–52.0)
Hemoglobin: 11.6 g/dL — ABNORMAL LOW (ref 13.0–17.0)
Immature Granulocytes: 0 %
Lymphocytes Relative: 20 %
Lymphs Abs: 1 10*3/uL (ref 0.7–4.0)
MCH: 30.4 pg (ref 26.0–34.0)
MCHC: 33.7 g/dL (ref 30.0–36.0)
MCV: 90.1 fL (ref 80.0–100.0)
Monocytes Absolute: 0.6 10*3/uL (ref 0.1–1.0)
Monocytes Relative: 11 %
Neutro Abs: 3.2 10*3/uL (ref 1.7–7.7)
Neutrophils Relative %: 65 %
Platelet Count: 139 10*3/uL — ABNORMAL LOW (ref 150–400)
RBC: 3.82 MIL/uL — ABNORMAL LOW (ref 4.22–5.81)
RDW: 16.7 % — ABNORMAL HIGH (ref 11.5–15.5)
WBC Count: 5 10*3/uL (ref 4.0–10.5)
nRBC: 0 % (ref 0.0–0.2)

## 2022-06-19 LAB — CMP (CANCER CENTER ONLY)
ALT: 82 U/L — ABNORMAL HIGH (ref 0–44)
AST: 30 U/L (ref 15–41)
Albumin: 4 g/dL (ref 3.5–5.0)
Alkaline Phosphatase: 189 U/L — ABNORMAL HIGH (ref 38–126)
Anion gap: 4 — ABNORMAL LOW (ref 5–15)
BUN: 9 mg/dL (ref 6–20)
CO2: 29 mmol/L (ref 22–32)
Calcium: 9.2 mg/dL (ref 8.9–10.3)
Chloride: 106 mmol/L (ref 98–111)
Creatinine: 0.66 mg/dL (ref 0.61–1.24)
GFR, Estimated: >60 mL/min (ref >60)
Glucose, Bld: 102 mg/dL — ABNORMAL HIGH (ref 70–99)
Potassium: 3.9 mmol/L (ref 3.5–5.1)
Sodium: 139 mmol/L (ref 135–145)
Total Bilirubin: 0.5 mg/dL (ref 0.3–1.2)
Total Protein: 7.3 g/dL (ref 6.5–8.1)

## 2022-06-19 MED ORDER — PALONOSETRON HCL INJECTION 0.25 MG/5ML
0.2500 mg | Freq: Once | INTRAVENOUS | Status: AC
  Administered 2022-06-19: 0.25 mg via INTRAVENOUS
  Filled 2022-06-19: qty 5

## 2022-06-19 MED ORDER — SODIUM CHLORIDE 0.9 % IV SOLN
2400.0000 mg/m2 | INTRAVENOUS | Status: DC
  Administered 2022-06-19: 6150 mg via INTRAVENOUS
  Filled 2022-06-19: qty 123

## 2022-06-19 MED ORDER — FLUOROURACIL CHEMO INJECTION 2.5 GM/50ML
400.0000 mg/m2 | Freq: Once | INTRAVENOUS | Status: AC
  Administered 2022-06-19: 1000 mg via INTRAVENOUS
  Filled 2022-06-19: qty 20

## 2022-06-19 MED ORDER — OXALIPLATIN CHEMO INJECTION 100 MG/20ML
85.0000 mg/m2 | Freq: Once | INTRAVENOUS | Status: AC
  Administered 2022-06-19: 200 mg via INTRAVENOUS
  Filled 2022-06-19: qty 4.65

## 2022-06-19 MED ORDER — LEUCOVORIN CALCIUM INJECTION 350 MG
400.0000 mg/m2 | Freq: Once | INTRAVENOUS | Status: AC
  Administered 2022-06-19: 1028 mg via INTRAVENOUS
  Filled 2022-06-19: qty 51.4

## 2022-06-19 MED ORDER — SODIUM CHLORIDE 0.9 % IV SOLN
10.0000 mg | Freq: Once | INTRAVENOUS | Status: AC
  Administered 2022-06-19: 10 mg via INTRAVENOUS
  Filled 2022-06-19: qty 10

## 2022-06-19 MED ORDER — SODIUM CHLORIDE 0.9% FLUSH
10.0000 mL | Freq: Once | INTRAVENOUS | Status: AC
  Administered 2022-06-19: 10 mL

## 2022-06-19 MED ORDER — DEXTROSE 5 % IV SOLN: Freq: Once | INTRAVENOUS | Status: AC

## 2022-06-19 NOTE — Progress Notes (Signed)
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  HISTORY OF PRESENTING ILLNESS:  Leon Fischer. is a wonderful 32 y.o. 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 He presents today accompanied by his mother. He reports He is doing well.  He reports no color change in stool.  He notes that he is eating well but feels food just sits in stomach.  We discussed getting additional scans and labs for further evaluation and treatment which he was agreeable to. We further discussed getting MRI of brain and PET scan which he was agreeable to.  He notes that he is trying to hold it together mentally and is still trying to process his new diagnosis. We discussed the options of psychological or spiritual counseling which he has not decided on at this time. He was advised that may call back if he decides that he would like to pursue either.  We discussed getting a port a cath installed which he was agreeable to.  He recently had a biliary stent put into common bile duct.   No abdominal pain or change in bowel habits. No new or unexpected weight loss. No other new or acute focal symptoms.  We discussed his recent stomach biopsy done 07/13/2021 which sowed gastropathy and minor chronic gastritis with lymphoid aggregate. Negative for H. Pylori.  We discussed recent cytology done 07/13/2021 which showed presence of malignant cells in pancreas and poorly differentiated/high grade neuroendocrine carcinoma.  We discussed upper endoscopy done 07/13/2021 revealed mass in pancreatic head and  some enlarged lymph nodes were seen in the celiac and peripancreatic regions.  Labs done today were reviewed in detail.  INTERVAL HISTORY: Leon Fischer. Is a 32 y.o. 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   Patient was last seen by me on 05/28/2022 and was doing well overall with no new medical concerns.   Today, he is scheduled to receive day 1 cycle 4 of his FOLFOX treatment. He is accompanied by his mother. He reports that he has been tolerating his treatment well with no major toxicities. He does report mild fatigue from treatment. He denies any nausea, vomiting, diarrhea, major tingling/numbness of hands/feet, abdominal pain, chest pain, or SOB.  Patient does endorse tingling/numbness when touching cold objects, but denies any persistent symptoms. He has normal p.o. intake and his weight has been stable.   MEDICAL HISTORY:  Past Medical History:  Diagnosis Date   Asthma    Neuroendocrine cancer (HCC)     SURGICAL HISTORY: Past Surgical History:  Procedure Laterality Date   BILIARY DILATION  07/13/2021   Procedure: BILIARY DILATION;  Surgeon: Vida Rigger, MD;  Location: [facility];  Service: Gastroenterology;;   BILIARY STENT PLACEMENT N/A 07/13/2021   Procedure: BILIARY STENT PLACEMENT;  Surgeon: Vida Rigger, MD;  Location: [name];  Service: Gastroenterology;  Laterality: N/A;   BILIARY STENT PLACEMENT N/A 11/05/2021   Procedure: BILIARY STENT PLACEMENT;  Surgeon: Vida Rigger, MD;  Location: [name];  Service: Gastroenterology;  Laterality: N/A;   BIOPSY  07/13/2021   Procedure: BIOPSY;  Surgeon: Meridee Score Netty Starring., MD;  Location: [name];  Service: Gastroenterology;;  CHOLECYSTECTOMY N/A 11/07/2021   Procedure: LAPAROSCOPIC CHOLECYSTECTOMY;  Surgeon: Berna Bue, MD;  Location: [facility];  Service: General;  Laterality: N/A;   ENDOSCOPIC RETROGRADE CHOLANGIOPANCREATOGRAPHY  (ERCP) WITH PROPOFOL N/A 07/13/2021   Procedure: ENDOSCOPIC RETROGRADE CHOLANGIOPANCREATOGRAPHY (ERCP) WITH PROPOFOL;  Surgeon: Vida Rigger, MD;  Location: [name];  Service: Gastroenterology;  Laterality: N/A;   ERCP N/A 11/05/2021   Procedure: ENDOSCOPIC RETROGRADE CHOLANGIOPANCREATOGRAPHY (ERCP);  Surgeon: Vida Rigger, MD;  Location: [facility];  Service: Gastroenterology;  Laterality: N/A;   ESOPHAGOGASTRODUODENOSCOPY (EGD) WITH PROPOFOL N/A 07/13/2021   Procedure: ESOPHAGOGASTRODUODENOSCOPY (EGD) WITH PROPOFOL;  Surgeon: Meridee Score Netty Starring., MD;  Location: [name];  Service: Gastroenterology;  Laterality: N/A;   EUS N/A 07/13/2021   Procedure: UPPER ENDOSCOPIC ULTRASOUND (EUS) LINEAR;  Surgeon: Lemar Lofty., MD;  Location: [name];  Service: Gastroenterology;  Laterality: N/A;   FINE NEEDLE ASPIRATION  07/13/2021   Procedure: FINE NEEDLE ASPIRATION (FNA) LINEAR;  Surgeon: Lemar Lofty., MD;  Location: [facility];  Service: Gastroenterology;;   IR IMAGING GUIDED PORT INSERTION  07/31/2021   SPHINCTEROTOMY  07/13/2021   Procedure: Dennison Mascot;  Surgeon: Vida Rigger, MD;  Location: [name];  Service: Gastroenterology;;    SOCIAL HISTORY: Social History   Socioeconomic History   Marital status: Single    Spouse name: Not on file   Number of children: 0   Years of education: Not on file   Highest education level: Some college, no degree  Occupational History   Not on file  Tobacco Use   Smoking status: Some Days    Types: Cigarettes   Smokeless tobacco: Never  Vaping Use   Vaping Use: Never used  Substance and Sexual Activity   Alcohol use: Yes    Comment: social   Drug use: No   Sexual activity: Never  Other Topics Concern   Not on file  Social History Narrative   Not on file   Social Determinants of Health   Financial Resource Strain: Low Risk  (04/14/2017)   Overall Financial Resource Strain (CARDIA)    Difficulty of Paying  Living Expenses: Not hard at all  Food Insecurity: No Food Insecurity (11/07/2021)   Hunger Vital Sign    Worried About Running Out of Food in the Last Year: Never true    Ran Out of Food in the Last Year: Never true  Transportation Needs: No Transportation Needs (11/07/2021)   PRAPARE - Administrator, Civil Service (Medical): No    Lack of Transportation (Non-Medical): No  Physical Activity: Inactive (04/14/2017)   Exercise Vital Sign    Days of Exercise per Week: 0 days    Minutes of Exercise per Session: 0 min  Stress: Stress Concern Present (04/14/2017)   [name] of Occupational Health - Occupational Stress Questionnaire    Feeling of Stress : Rather much  Social Connections: Moderately Isolated (04/14/2017)   Social Connection and Isolation Panel [NHANES]    Frequency of Communication with Friends and Family: More than three times a week    Frequency of Social Gatherings with Friends and Family: More than three times a week    Attends Religious Services: Never    Database administrator or Organizations: No    Attends Banker Meetings: Never    Marital Status: Never married  Intimate Partner Violence: Not At Risk (11/07/2021)   Humiliation, Afraid, Rape, and Kick questionnaire    Fear of Current or Ex-Partner: No    Emotionally Abused: No  Physically Abused: No    Sexually Abused: No    FAMILY HISTORY: Family History  Problem Relation Age of Onset   Drug abuse Father    ALLERGIES:  is allergic to shellfish allergy and other.  MEDICATIONS:  Current Outpatient Medications  Medication Sig Dispense Refill   acetaminophen (TYLENOL) 500 MG tablet Take 1,000 mg by mouth every 6 (six) hours as needed for mild pain.     albuterol (VENTOLIN HFA) 108 (90 Base) MCG/ACT inhaler INHALE 2 PUFFS BY MOUTH EVERY 4 HOURS AS NEEDED FOR WHEEZING FOR SHORTNESS OF BREATH 18 g 0   dexamethasone (DECADRON) 4 MG tablet Take 2 tablets (8 mg total) by mouth daily.  Start the day after chemotherapy for 2 days. Take with food. 30 tablet 1   diclofenac Sodium (VOLTAREN) 1 % GEL Apply 4 g topically 4 (four) times daily. (Patient taking differently: Apply 4 g topically 4 (four) times daily as needed (pain).) 100 g 0   lidocaine-prilocaine (EMLA) cream Apply to affected area once 30 g 3   methocarbamol (ROBAXIN) 500 MG tablet Take 1 tablet (500 mg total) by mouth 2 (two) times daily. 20 tablet 0   ondansetron (ZOFRAN) 8 MG tablet Take 1 tablet (8 mg total) by mouth every 8 (eight) hours as needed for nausea or vomiting. Start on the third day after chemotherapy. 30 tablet 1   oxyCODONE (OXY IR/ROXICODONE) 5 MG immediate release tablet Take 1 tablet (5 mg total) by mouth every 6 (six) hours as needed for severe pain or moderate pain. 20 tablet 0   polyethylene glycol (MIRALAX / GLYCOLAX) 17 g packet Take 17 g by mouth daily. (Patient taking differently: Take 17 g by mouth daily as needed for mild constipation or moderate constipation.) 14 each 0   prochlorperazine (COMPAZINE) 10 MG tablet Take 1 tablet (10 mg total) by mouth every 6 (six) hours as needed for nausea or vomiting. 30 tablet 1   senna-docusate (SENOKOT-S) 8.6-50 MG tablet Take 2 tablets by mouth 2 (two) times daily. (Patient taking differently: Take 2 tablets by mouth daily as needed for mild constipation or moderate constipation.) 30 tablet 0   No current facility-administered medications for this visit.    REVIEW OF SYSTEMS:    10 Point review of Systems was done is negative except as noted above.   PHYSICAL EXAMINATION: ECOG PERFORMANCE STATUS: 1 - Symptomatic but completely ambulatory  Vitals:   06/19/22 1230  BP: 126/86  Pulse: 88  Resp: 18  Temp: 97.9 F (36.6 C)  SpO2: 100%  GENERAL:alert, in no acute distress and comfortable SKIN: no acute rashes, no significant lesions EYES: conjunctiva are pink and non-injected, sclera anicteric OROPHARYNX: MMM, no exudates, no oropharyngeal  erythema or ulceration NECK: supple, no JVD LYMPH:  no palpable lymphadenopathy in the cervical, axillary or inguinal regions LUNGS: clear to auscultation b/l with normal respiratory effort HEART: regular rate & rhythm ABDOMEN:  normoactive bowel sounds , non tender, not distended. Extremity: no pedal edema PSYCH: alert & oriented x 3 with fluent speech NEURO: no focal motor/sensory deficits   LABORATORY DATA:  I have reviewed the data as listed    Latest Ref Rng & Units 06/19/2022   11:50 AM 06/05/2022    8:24 AM 05/28/2022   12:06 PM  CBC  WBC 4.0 - 10.5 K/uL 5.0  4.4  5.4   Hemoglobin 13.0 - 17.0 g/dL 16.1  09.6  04.5   Hematocrit 39.0 - 52.0 % 34.4  34.5  34.2   Platelets 150 - 400 K/uL 139  269  177       Latest Ref Rng & Units 06/19/2022   11:50 AM 06/05/2022    8:24 AM 05/28/2022   12:06 PM  CMP  Glucose 70 - 99 mg/dL 161  99  096   BUN 6 - 20 mg/dL 9  12  10    Creatinine 0.61 - 1.24 mg/dL 0.45  4.09  8.11   Sodium 135 - 145 mmol/L 139  139  140   Potassium 3.5 - 5.1 mmol/L 3.9  3.7  3.6   Chloride 98 - 111 mmol/L 106  105  106   CO2 22 - 32 mmol/L 29  28  27    Calcium 8.9 - 10.3 mg/dL 9.2  9.4  9.6   Total Protein 6.5 - 8.1 g/dL 7.3  7.6  7.4   Total Bilirubin 0.3 - 1.2 mg/dL 0.5  0.5  0.6   Alkaline Phos 38 - 126 U/L 189  372  407   AST 15 - 41 U/L 30  44  193   ALT 0 - 44 U/L 82  235  544    Cytology done 07/13/2021 revealed "FINAL MICROSCOPIC DIAGNOSIS:  A. PANCREAS, HEAD, FINE NEEDLE ASPIRATION:  - Malignant cells present  - Poorly differentiated/high-grade neuroendocrine carcinoma (see  comment)   B. CBD STRICTURE, BRUSHING:  - Atypical cells suspicious for tumor   C. BILIARY DILATION, BALLOON, REMOVAL:  - Atypical cells suspicious for tumor "  Surgical pathology done 07/13/2021 revealed "FINAL MICROSCOPIC DIAGNOSIS:   A. STOMACH, BIOPSY:  Reactive gastropathy and minimal chronic gastritis with lymphoid  aggregate  Negative for H. pylori, intestinal  metaplasia, dysplasia and carcinoma"   Pathology 11/07/21: FINAL MICROSCOPIC DIAGNOSIS:   A. GALLBLADDER, CHOLECYSTECTOMY:  - Acute cholecystitis with necrosis and abscess.   RADIOGRAPHIC STUDIES: I have personally reviewed the radiological images as listed and agreed with the findings in the report. US Abdomen Complete  Result Date: 06/08/2022 CLINICAL DATA:  32 year old male history of metastatic high-grade neuroendocrine tumor on chemotherapy. Worsening liver function tests. EXAM: ABDOMEN ULTRASOUND COMPLETE COMPARISON:  No prior abdominal ultrasound. CT of the abdomen and pelvis 04/04/2022. FINDINGS: Comment: Overall, image quality is suboptimal secondary to patient's body habitus and overlying bowel gas. Gallbladder: Status post cholecystectomy. Common bile duct: Diameter: 3 mm Liver: Hypoechoic lesion in the left lobe of the liver anteriorly measuring 2.0 x 1.9 x 1.2 cm. Heterogeneity throughout the hepatic parenchyma is also noted, without other discrete hepatic lesions confidently identified on today's suboptimal ultrasound examination. Within normal limits in parenchymal echogenicity. Portal vein is patent on color Doppler imaging with normal direction of blood flow towards the liver. IVC: No abnormality visualized. Pancreas: Visualized portion unremarkable. Spleen: Size and appearance within normal limits. Right Kidney: Length: 12.6 cm. Echogenicity within normal limits. No mass or hydronephrosis visualized. Left Kidney: Length: 12.9 cm. Echogenicity within normal limits. No mass or hydronephrosis visualized. Abdominal aorta: No aneurysm visualized. Other findings: None. IMPRESSION: 1. Suboptimal ultrasound examination demonstrating at least 1 hypoechoic liver lesion which may represent a metastatic lesion. Direct comparison with prior CT of the abdomen and pelvis 04/04/2022 is challenging given the overall heterogeneous appearance of the liver. For more accurate follow-up of hepatic metastatic  disease, repeat contrast-enhanced CT of the abdomen or MRI of the abdomen with and without IV gadolinium would be recommended. 2. Status post cholecystectomy. Electronically Signed   By: Trudie Reed M.D.   On: 06/08/2022 10:01  ASSESSMENT & PLAN:   32 y.o. very pleasant male with  1. 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 -Cytology done 07/13/2021 shows presence of malignant cells in pancreas and poorly differentiated/high grade neuroendocrine carcinoma. - CT Chest Abdomen Pelvis results from 04/04/2022 with the patient. Showed: No evidence of thoracic metastasis. Interval increase in size and prominence multifocal hepatic metastasis. Interval increase in peripancreatic and pericaval retroperitoneal lymphadenopathy. No biliary duct dilatation with stent placed. Interval subtle increase in size of mass in the pancreatic head. Mild pancreatic duct dilatation similar prior.  PLAN:  -Discussed lab results on 06/19/2022 in detail with patient. CBC stable, showed WBC of 5.0K, hemoglobin of 11.6, and platelets of 139K.  -CMP showed improved liver enzymes. AST previously 44, now 30, ALT previously 235, now 82 -recent US abdomen did not reveal any new findings -Patient notes no new acute toxicities from his FOLFOX chemotherapy treatmnent -Proceed with next cycle of FOLFOX treatment -continue same supportive medications -will plan to repeat CT chest/abdm/pelvis scan  FOLLOW-UP: -Ct chest/abd/pelvis in 7-10 days -Plz schedule next 2 cycles of FOLFOx with portflush and labs per integrated scheduling  The total time spent in the appointment was 30 minutes* .  All of the patient's questions were answered with apparent satisfaction. The patient knows to call the clinic with any problems, questions or concerns.   Wyvonnia Lora MD MS AAHIVMS [facility] Hematology/Oncology Physician [facility]  .*Total Encounter Time as defined by the Centers for Medicare and Medicaid Services includes, in addition to the face-to-face time of a patient visit (documented in the note above) non-face-to-face time: obtaining and reviewing outside history, ordering and reviewing medications, tests or procedures, care coordination (communications with other health care professionals or caregivers) and documentation in the medical record.   I,Mitra Faeizi,acting as a Neurosurgeon for Wyvonnia Lora, MD.,have documented all relevant documentation on the behalf of Wyvonnia Lora, MD,as directed by  Wyvonnia Lora, MD while in the presence of Wyvonnia Lora, MD.  .I have reviewed the above documentation for accuracy and completeness, and I agree with the above. Leon Maine MD

## 2022-06-19 NOTE — Progress Notes (Signed)
OK to treat with ALT 82 per Dr Candise Che

## 2022-06-19 NOTE — Patient Instructions (Addendum)
[facility]  Discharge Instructions: Thank you for choosing [facility] to provide your oncology and hematology care.   If you have a lab appointment with the Cancer Center, please go directly to the Cancer Center and check in at the registration area.   Wear comfortable clothing and clothing appropriate for easy access to any Portacath or PICC line.   We strive to give you quality time with your provider. You may need to reschedule your appointment if you arrive late (15 or more minutes).  Arriving late affects you and other patients whose appointments are after yours.  Also, if you miss three or more appointments without notifying the office, you may be dismissed from the clinic at the provider's discretion.      For prescription refill requests, have your pharmacy contact our office and allow 72 hours for refills to be completed.    Today you received the following chemotherapy and/or immunotherapy agents: Oxaliplatin, Leucovorin, Fluorouracil.       To help prevent nausea and vomiting after your treatment, we encourage you to take your nausea medication as directed.  BELOW ARE SYMPTOMS THAT SHOULD BE REPORTED IMMEDIATELY: *FEVER GREATER THAN 100.4 F (38 C) OR HIGHER *CHILLS OR SWEATING *NAUSEA AND VOMITING THAT IS NOT CONTROLLED WITH YOUR NAUSEA MEDICATION *UNUSUAL SHORTNESS OF BREATH *UNUSUAL BRUISING OR BLEEDING *URINARY PROBLEMS (pain or burning when urinating, or frequent urination) *BOWEL PROBLEMS (unusual diarrhea, constipation, pain near the anus) TENDERNESS IN MOUTH AND THROAT WITH OR WITHOUT PRESENCE OF ULCERS (sore throat, sores in mouth, or a toothache) UNUSUAL RASH, SWELLING OR PAIN  UNUSUAL VAGINAL DISCHARGE OR ITCHING   Items with * indicate a potential emergency and should be followed up as soon as possible or go to the Emergency Department if any problems should occur.  Please show the CHEMOTHERAPY ALERT CARD or  IMMUNOTHERAPY ALERT CARD at check-in to the Emergency Department and triage nurse.  Should you have questions after your visit or need to cancel or reschedule your appointment, please contact [facility]  Dept: [phone]  and follow the prompts.  Office hours are 8:00 a.m. to 4:30 p.m. Monday - Friday. Please note that voicemails left after 4:00 p.m. may not be returned until the following business day.  We are closed weekends and major holidays. You have access to a nurse at all times for urgent questions. Please call the main number to the clinic Dept: [phone] and follow the prompts.   For any non-urgent questions, you may also contact your provider using MyChart. We now offer e-Visits for anyone 18 and older to request care online for non-urgent symptoms. For details visit [email or web address].   Also download the MyChart app! Go to the app store, search "MyChart", open the app, select [facility], and log in with your MyChart username and password.   

## 2022-06-21 ENCOUNTER — Inpatient Hospital Stay: Payer: 59

## 2022-06-21 VITALS — BP 108/76 | HR 78 | Temp 98.8°F | Resp 18

## 2022-06-21 DIAGNOSIS — Z5111 Encounter for antineoplastic chemotherapy: Secondary | ICD-10-CM | POA: Diagnosis not present

## 2022-06-21 DIAGNOSIS — Z7189 Other specified counseling: Secondary | ICD-10-CM

## 2022-06-21 DIAGNOSIS — C7A1 Malignant poorly differentiated neuroendocrine tumors: Secondary | ICD-10-CM

## 2022-06-21 MED ORDER — SODIUM CHLORIDE 0.9% FLUSH
10.0000 mL | INTRAVENOUS | Status: DC | PRN
  Administered 2022-06-21: 10 mL

## 2022-06-21 MED ORDER — HEPARIN SOD (PORK) LOCK FLUSH 100 UNIT/ML IV SOLN
500.0000 [IU] | Freq: Once | INTRAVENOUS | Status: AC | PRN
  Administered 2022-06-21: 500 [IU]

## 2022-06-26 ENCOUNTER — Encounter: Payer: Self-pay | Admitting: Hematology

## 2022-06-26 NOTE — Progress Notes (Incomplete)
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  HISTORY OF PRESENTING ILLNESS:  Leon Fischer. is a wonderful 32 y.o. 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 He presents today accompanied by his mother. He reports He is doing well.  He reports no color change in stool.  He notes that he is eating well but feels food just sits in stomach.  We discussed getting additional scans and labs for further evaluation and treatment which he was agreeable to. We further discussed getting MRI of brain and PET scan which he was agreeable to.  He notes that he is trying to hold it together mentally and is still trying to process his new diagnosis. We discussed the options of psychological or spiritual counseling which he has not decided on at this time. He was advised that may call back if he decides that he would like to pursue either.  We discussed getting a port a cath installed which he was agreeable to.  He recently had a biliary stent put into common bile duct.   No abdominal pain or change in bowel habits. No new or unexpected weight loss. No other new or acute focal symptoms.  We discussed his recent stomach biopsy done 07/13/2021 which sowed gastropathy and minor chronic gastritis with lymphoid aggregate. Negative for H. Pylori.  We discussed recent cytology done 07/13/2021 which showed presence of malignant cells in pancreas and poorly differentiated/high grade neuroendocrine carcinoma.  We discussed upper endoscopy done 07/13/2021 revealed mass in pancreatic head and  some enlarged lymph nodes were seen in the celiac and peripancreatic regions.  Labs done today were reviewed in detail.  INTERVAL HISTORY: Leon Fischer. Is a 32 y.o. 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   Patient was last seen by me on 05/28/2022 and was doing well overall with no new medical concerns.   Today, he is scheduled to receive day 1 cycle 4 of his FOLFOX treatment. He is accompanied by his mother. He reports that he has been tolerating his treatment well with no major toxicities. He does report mild fatigue from treatment. He denies any nausea, vomiting, diarrhea, major tingling/numbness of hands/feet, abdominal pain, chest pain, or SOB.  Patient does endorse tingling/numbness when touching cold objects, but denies any persistent symptoms. He has normal p.o. intake and his weight has been stable.   MEDICAL HISTORY:  Past Medical History:  Diagnosis Date  . Asthma   . Neuroendocrine cancer [facility])     SURGICAL HISTORY: Past Surgical History:  Procedure Laterality Date  . BILIARY DILATION  07/13/2021   Procedure: BILIARY DILATION;  Surgeon: Vida Rigger, MD;  Location: [facility];  Service: Gastroenterology;;  . BILIARY STENT PLACEMENT N/A 07/13/2021   Procedure: BILIARY STENT PLACEMENT;  Surgeon: Vida Rigger, MD;  Location: [name];  Service: Gastroenterology;  Laterality: N/A;  . BILIARY STENT PLACEMENT N/A 11/05/2021   Procedure: BILIARY STENT PLACEMENT;  Surgeon: Vida Rigger, MD;  Location: [name];  Service: Gastroenterology;  Laterality: N/A;  . BIOPSY  07/13/2021   Procedure: BIOPSY;  Surgeon: Meridee Score Netty Starring., MD;  Location: [name];  Service: Gastroenterology;;  .  CHOLECYSTECTOMY N/A 11/07/2021   Procedure: LAPAROSCOPIC CHOLECYSTECTOMY;  Surgeon: Berna Bue, MD;  Location: [facility];  Service: General;  Laterality: N/A;  . ENDOSCOPIC RETROGRADE  CHOLANGIOPANCREATOGRAPHY (ERCP) WITH PROPOFOL N/A 07/13/2021   Procedure: ENDOSCOPIC RETROGRADE CHOLANGIOPANCREATOGRAPHY (ERCP) WITH PROPOFOL;  Surgeon: Vida Rigger, MD;  Location: [name];  Service: Gastroenterology;  Laterality: N/A;  . ERCP N/A 11/05/2021   Procedure: ENDOSCOPIC RETROGRADE CHOLANGIOPANCREATOGRAPHY (ERCP);  Surgeon: Vida Rigger, MD;  Location: [facility];  Service: Gastroenterology;  Laterality: N/A;  . ESOPHAGOGASTRODUODENOSCOPY (EGD) WITH PROPOFOL N/A 07/13/2021   Procedure: ESOPHAGOGASTRODUODENOSCOPY (EGD) WITH PROPOFOL;  Surgeon: Meridee Score Netty Starring., MD;  Location: [name];  Service: Gastroenterology;  Laterality: N/A;  . EUS N/A 07/13/2021   Procedure: UPPER ENDOSCOPIC ULTRASOUND (EUS) LINEAR;  Surgeon: Lemar Lofty., MD;  Location: [name];  Service: Gastroenterology;  Laterality: N/A;  . FINE NEEDLE ASPIRATION  07/13/2021   Procedure: FINE NEEDLE ASPIRATION (FNA) LINEAR;  Surgeon: Lemar Lofty., MD;  Location: [name];  Service: Gastroenterology;;  . IR IMAGING GUIDED PORT INSERTION  07/31/2021  . SPHINCTEROTOMY  07/13/2021   Procedure: SPHINCTEROTOMY;  Surgeon: Vida Rigger, MD;  Location: [facility];  Service: Gastroenterology;;    SOCIAL HISTORY: Social History   Socioeconomic History  . Marital status: Single    Spouse name: Not on file  . Number of children: 0  . Years of education: Not on file  . Highest education level: Some college, no degree  Occupational History  . Not on file  Tobacco Use  . Smoking status: Some Days    Types: Cigarettes  . Smokeless tobacco: Never  Vaping Use  . Vaping Use: Never used  Substance and Sexual Activity  . Alcohol use: Yes    Comment: social  . Drug use: No  . Sexual activity: Never  Other Topics Concern  . Not on file  Social History Narrative  . Not on file   Social Determinants of Health   Financial Resource Strain: Low Risk  (04/14/2017)   Overall Financial Resource  Strain (CARDIA)   . Difficulty of Paying Living Expenses: Not hard at all  Food Insecurity: No Food Insecurity (11/07/2021)   Hunger Vital Sign   . Worried About Programme researcher, broadcasting/film/video in the Last Year: Never true   . Ran Out of Food in the Last Year: Never true  Transportation Needs: No Transportation Needs (11/07/2021)   PRAPARE - Transportation   . Lack of Transportation (Medical): No   . Lack of Transportation (Non-Medical): No  Physical Activity: Inactive (04/14/2017)   Exercise Vital Sign   . Days of Exercise per Week: 0 days   . Minutes of Exercise per Session: 0 min  Stress: Stress Concern Present (04/14/2017)   [name] of Occupational Health - Occupational Stress Questionnaire   . Feeling of Stress : Rather much  Social Connections: Moderately Isolated (04/14/2017)   Social Connection and Isolation Panel [NHANES]   . Frequency of Communication with Friends and Family: More than three times a week   . Frequency of Social Gatherings with Friends and Family: More than three times a week   . Attends Religious Services: Never   . Active Member of Clubs or Organizations: No   . Attends Banker Meetings: Never   . Marital Status: Never married  Intimate Partner Violence: Not At Risk (11/07/2021)   Humiliation, Afraid, Rape, and Kick questionnaire   . Fear of Current or Ex-Partner: No   . Emotionally Abused: No   .  Physically Abused: No   . Sexually Abused: No    FAMILY HISTORY: Family History  Problem Relation Age of Onset  . Drug abuse Father    ALLERGIES:  is allergic to shellfish allergy and other.  MEDICATIONS:  Current Outpatient Medications  Medication Sig Dispense Refill  . acetaminophen (TYLENOL) 500 MG tablet Take 1,000 mg by mouth every 6 (six) hours as needed for mild pain.    Marland Kitchen albuterol (VENTOLIN HFA) 108 (90 Base) MCG/ACT inhaler INHALE 2 PUFFS BY MOUTH EVERY 4 HOURS AS NEEDED FOR WHEEZING FOR SHORTNESS OF BREATH 18 g 0  . dexamethasone  (DECADRON) 4 MG tablet Take 2 tablets (8 mg total) by mouth daily. Start the day after chemotherapy for 2 days. Take with food. 30 tablet 1  . diclofenac Sodium (VOLTAREN) 1 % GEL Apply 4 g topically 4 (four) times daily. (Patient taking differently: Apply 4 g topically 4 (four) times daily as needed (pain).) 100 g 0  . lidocaine-prilocaine (EMLA) cream Apply to affected area once 30 g 3  . methocarbamol (ROBAXIN) 500 MG tablet Take 1 tablet (500 mg total) by mouth 2 (two) times daily. 20 tablet 0  . ondansetron (ZOFRAN) 8 MG tablet Take 1 tablet (8 mg total) by mouth every 8 (eight) hours as needed for nausea or vomiting. Start on the third day after chemotherapy. 30 tablet 1  . oxyCODONE (OXY IR/ROXICODONE) 5 MG immediate release tablet Take 1 tablet (5 mg total) by mouth every 6 (six) hours as needed for severe pain or moderate pain. 20 tablet 0  . polyethylene glycol (MIRALAX / GLYCOLAX) 17 g packet Take 17 g by mouth daily. (Patient taking differently: Take 17 g by mouth daily as needed for mild constipation or moderate constipation.) 14 each 0  . prochlorperazine (COMPAZINE) 10 MG tablet Take 1 tablet (10 mg total) by mouth every 6 (six) hours as needed for nausea or vomiting. 30 tablet 1  . senna-docusate (SENOKOT-S) 8.6-50 MG tablet Take 2 tablets by mouth 2 (two) times daily. (Patient taking differently: Take 2 tablets by mouth daily as needed for mild constipation or moderate constipation.) 30 tablet 0   No current facility-administered medications for this visit.    REVIEW OF SYSTEMS:    10 Point review of Systems was done is negative except as noted above.   PHYSICAL EXAMINATION: ECOG PERFORMANCE STATUS: 1 - Symptomatic but completely ambulatory  Vitals:   06/19/22 1230  BP: 126/86  Pulse: 88  Resp: 18  Temp: 97.9 F (36.6 C)  SpO2: 100%  GENERAL:alert, in no acute distress and comfortable SKIN: no acute rashes, no significant lesions EYES: conjunctiva are pink and  non-injected, sclera anicteric OROPHARYNX: MMM, no exudates, no oropharyngeal erythema or ulceration NECK: supple, no JVD LYMPH:  no palpable lymphadenopathy in the cervical, axillary or inguinal regions LUNGS: clear to auscultation b/l with normal respiratory effort HEART: regular rate & rhythm ABDOMEN:  normoactive bowel sounds , non tender, not distended. Extremity: no pedal edema PSYCH: alert & oriented x 3 with fluent speech NEURO: no focal motor/sensory deficits   LABORATORY DATA:  I have reviewed the data as listed    Latest Ref Rng & Units 06/19/2022   11:50 AM 06/05/2022    8:24 AM 05/28/2022   12:06 PM  CBC  WBC 4.0 - 10.5 K/uL 5.0  4.4  5.4   Hemoglobin 13.0 - 17.0 g/dL 08.6  57.8  46.9   Hematocrit 39.0 - 52.0 % 34.4  34.5  34.2   Platelets 150 - 400 K/uL 139  269  177       Latest Ref Rng & Units 06/19/2022   11:50 AM 06/05/2022    8:24 AM 05/28/2022   12:06 PM  CMP  Glucose 70 - 99 mg/dL 811  99  914   BUN 6 - 20 mg/dL 9  12  10    Creatinine 0.61 - 1.24 mg/dL 7.82  9.56  2.13   Sodium 135 - 145 mmol/L 139  139  140   Potassium 3.5 - 5.1 mmol/L 3.9  3.7  3.6   Chloride 98 - 111 mmol/L 106  105  106   CO2 22 - 32 mmol/L 29  28  27    Calcium 8.9 - 10.3 mg/dL 9.2  9.4  9.6   Total Protein 6.5 - 8.1 g/dL 7.3  7.6  7.4   Total Bilirubin 0.3 - 1.2 mg/dL 0.5  0.5  0.6   Alkaline Phos 38 - 126 U/L 189  372  407   AST 15 - 41 U/L 30  44  193   ALT 0 - 44 U/L 82  235  544    Cytology done 07/13/2021 revealed "FINAL MICROSCOPIC DIAGNOSIS:  A. PANCREAS, HEAD, FINE NEEDLE ASPIRATION:  - Malignant cells present  - Poorly differentiated/high-grade neuroendocrine carcinoma (see  comment)   B. CBD STRICTURE, BRUSHING:  - Atypical cells suspicious for tumor   C. BILIARY DILATION, BALLOON, REMOVAL:  - Atypical cells suspicious for tumor "  Surgical pathology done 07/13/2021 revealed "FINAL MICROSCOPIC DIAGNOSIS:   A. STOMACH, BIOPSY:  Reactive gastropathy and minimal  chronic gastritis with lymphoid  aggregate  Negative for H. pylori, intestinal metaplasia, dysplasia and carcinoma"   Pathology 11/07/21: FINAL MICROSCOPIC DIAGNOSIS:   A. GALLBLADDER, CHOLECYSTECTOMY:  - Acute cholecystitis with necrosis and abscess.   RADIOGRAPHIC STUDIES: I have personally reviewed the radiological images as listed and agreed with the findings in the report. US Abdomen Complete  Result Date: 06/08/2022 CLINICAL DATA:  32 year old male history of metastatic high-grade neuroendocrine tumor on chemotherapy. Worsening liver function tests. EXAM: ABDOMEN ULTRASOUND COMPLETE COMPARISON:  No prior abdominal ultrasound. CT of the abdomen and pelvis 04/04/2022. FINDINGS: Comment: Overall, image quality is suboptimal secondary to patient's body habitus and overlying bowel gas. Gallbladder: Status post cholecystectomy. Common bile duct: Diameter: 3 mm Liver: Hypoechoic lesion in the left lobe of the liver anteriorly measuring 2.0 x 1.9 x 1.2 cm. Heterogeneity throughout the hepatic parenchyma is also noted, without other discrete hepatic lesions confidently identified on today's suboptimal ultrasound examination. Within normal limits in parenchymal echogenicity. Portal vein is patent on color Doppler imaging with normal direction of blood flow towards the liver. IVC: No abnormality visualized. Pancreas: Visualized portion unremarkable. Spleen: Size and appearance within normal limits. Right Kidney: Length: 12.6 cm. Echogenicity within normal limits. No mass or hydronephrosis visualized. Left Kidney: Length: 12.9 cm. Echogenicity within normal limits. No mass or hydronephrosis visualized. Abdominal aorta: No aneurysm visualized. Other findings: None. IMPRESSION: 1. Suboptimal ultrasound examination demonstrating at least 1 hypoechoic liver lesion which may represent a metastatic lesion. Direct comparison with prior CT of the abdomen and pelvis 04/04/2022 is challenging given the overall  heterogeneous appearance of the liver. For more accurate follow-up of hepatic metastatic disease, repeat contrast-enhanced CT of the abdomen or MRI of the abdomen with and without IV gadolinium would be recommended. 2. Status post cholecystectomy. Electronically Signed   By: Trudie Reed M.D.   On: 06/08/2022 10:01  ASSESSMENT & PLAN:   32 y.o. very pleasant male with  1. 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 -Cytology done 07/13/2021 shows presence of malignant cells in pancreas and poorly differentiated/high grade neuroendocrine carcinoma. - CT Chest Abdomen Pelvis results from 04/04/2022 with the patient. Showed: No evidence of thoracic metastasis. Interval increase in size and prominence multifocal hepatic metastasis. Interval increase in peripancreatic and pericaval retroperitoneal lymphadenopathy. No biliary duct dilatation with stent placed. Interval subtle increase in size of mass in the pancreatic head. Mild pancreatic duct dilatation similar prior.  PLAN:  -Discussed lab results on 06/19/2022 in detail with patient. CBC stable, showed WBC of 5.0K, hemoglobin of 11.6, and platelets of 139K.  -CMP showed improved liver enzymes. AST previously 44, now 30, ALT previously 235, now 82 -recent US abdomen did not reveal any new findings -Patient notes no new acute toxicities from his FOLFOX chemotherapy treatmnent -Proceed with next cycle of FOLFOX treatment -will plan to repeat CT chest/abdm/pelvis scan  FOLLOW-UP: -Ct chest/abd/pelvis in 7-10 days -Plz schedule next 2 cycles of FOLFOx with portflush and labs per integrated scheduling  The total time spent in the appointment was *** minutes* .  All of the patient's questions were answered with apparent satisfaction. The patient knows to call the clinic with any problems, questions or concerns.   Wyvonnia Lora MD MS AAHIVMS [facility]  [facility] Hematology/Oncology Physician [facility]  .*Total Encounter Time as defined by the Centers for Medicare and Medicaid Services includes, in addition to the face-to-face time of a patient visit (documented in the note above) non-face-to-face time: obtaining and reviewing outside history, ordering and reviewing medications, tests or procedures, care coordination (communications with other health care professionals or caregivers) and documentation in the medical record.   I,Mitra Faeizi,acting as a Neurosurgeon for Wyvonnia Lora, MD.,have documented all relevant documentation on the behalf of Wyvonnia Lora, MD,as directed by  Wyvonnia Lora, MD while in the presence of Wyvonnia Lora, MD.  ***

## 2022-07-02 MED FILL — Dexamethasone Sodium Phosphate Inj 100 MG/10ML: INTRAMUSCULAR | Qty: 1 | Status: AC

## 2022-07-03 ENCOUNTER — Inpatient Hospital Stay: Payer: 59

## 2022-07-03 ENCOUNTER — Other Ambulatory Visit: Payer: Self-pay

## 2022-07-03 ENCOUNTER — Inpatient Hospital Stay (HOSPITAL_BASED_OUTPATIENT_CLINIC_OR_DEPARTMENT_OTHER): Payer: 59

## 2022-07-03 VITALS — BP 146/82 | HR 86 | Temp 98.2°F | Resp 18 | Ht 72.0 in | Wt 280.5 lb

## 2022-07-03 DIAGNOSIS — C7A1 Malignant poorly differentiated neuroendocrine tumors: Secondary | ICD-10-CM

## 2022-07-03 DIAGNOSIS — Z5111 Encounter for antineoplastic chemotherapy: Secondary | ICD-10-CM | POA: Diagnosis not present

## 2022-07-03 DIAGNOSIS — Z7189 Other specified counseling: Secondary | ICD-10-CM

## 2022-07-03 DIAGNOSIS — Z95828 Presence of other vascular implants and grafts: Secondary | ICD-10-CM

## 2022-07-03 LAB — CMP (CANCER CENTER ONLY)
ALT: 67 U/L — ABNORMAL HIGH (ref 0–44)
AST: 35 U/L (ref 15–41)
Albumin: 3.8 g/dL (ref 3.5–5.0)
Alkaline Phosphatase: 139 U/L — ABNORMAL HIGH (ref 38–126)
Anion gap: 5 (ref 5–15)
BUN: 9 mg/dL (ref 6–20)
CO2: 28 mmol/L (ref 22–32)
Calcium: 8.8 mg/dL — ABNORMAL LOW (ref 8.9–10.3)
Chloride: 107 mmol/L (ref 98–111)
Creatinine: 0.58 mg/dL — ABNORMAL LOW (ref 0.61–1.24)
GFR, Estimated: >60 mL/min (ref >60)
Glucose, Bld: 118 mg/dL — ABNORMAL HIGH (ref 70–99)
Potassium: 3.4 mmol/L — ABNORMAL LOW (ref 3.5–5.1)
Sodium: 140 mmol/L (ref 135–145)
Total Bilirubin: 0.6 mg/dL (ref 0.3–1.2)
Total Protein: 7.2 g/dL (ref 6.5–8.1)

## 2022-07-03 LAB — CBC WITH DIFFERENTIAL (CANCER CENTER ONLY)
Abs Immature Granulocytes: 0.01 10*3/uL (ref 0.00–0.07)
Basophils Absolute: 0 10*3/uL (ref 0.0–0.1)
Basophils Relative: 1 %
Eosinophils Absolute: 0.1 10*3/uL (ref 0.0–0.5)
Eosinophils Relative: 3 %
HCT: 33.2 % — ABNORMAL LOW (ref 39.0–52.0)
Hemoglobin: 11.4 g/dL — ABNORMAL LOW (ref 13.0–17.0)
Immature Granulocytes: 0 %
Lymphocytes Relative: 20 %
Lymphs Abs: 0.8 10*3/uL (ref 0.7–4.0)
MCH: 31.1 pg (ref 26.0–34.0)
MCHC: 34.3 g/dL (ref 30.0–36.0)
MCV: 90.5 fL (ref 80.0–100.0)
Monocytes Absolute: 0.5 10*3/uL (ref 0.1–1.0)
Monocytes Relative: 13 %
Neutro Abs: 2.7 10*3/uL (ref 1.7–7.7)
Neutrophils Relative %: 63 %
Platelet Count: 109 10*3/uL — ABNORMAL LOW (ref 150–400)
RBC: 3.67 MIL/uL — ABNORMAL LOW (ref 4.22–5.81)
RDW: 15.6 % — ABNORMAL HIGH (ref 11.5–15.5)
WBC Count: 4.2 10*3/uL (ref 4.0–10.5)
nRBC: 0 % (ref 0.0–0.2)

## 2022-07-03 MED ORDER — PALONOSETRON HCL INJECTION 0.25 MG/5ML
0.2500 mg | Freq: Once | INTRAVENOUS | Status: AC
  Administered 2022-07-03: 0.25 mg via INTRAVENOUS
  Filled 2022-07-03: qty 5

## 2022-07-03 MED ORDER — SODIUM CHLORIDE 0.9 % IV SOLN
10.0000 mg | Freq: Once | INTRAVENOUS | Status: AC
  Administered 2022-07-03: 10 mg via INTRAVENOUS
  Filled 2022-07-03: qty 10

## 2022-07-03 MED ORDER — SODIUM CHLORIDE 0.9% FLUSH
10.0000 mL | Freq: Once | INTRAVENOUS | Status: AC
  Administered 2022-07-03: 10 mL

## 2022-07-03 MED ORDER — FLUOROURACIL CHEMO INJECTION 2.5 GM/50ML
400.0000 mg/m2 | Freq: Once | INTRAVENOUS | Status: AC
  Administered 2022-07-03: 1000 mg via INTRAVENOUS
  Filled 2022-07-03: qty 20

## 2022-07-03 MED ORDER — DEXTROSE 5 % IV SOLN: Freq: Once | INTRAVENOUS | Status: AC

## 2022-07-03 MED ORDER — LEUCOVORIN CALCIUM INJECTION 350 MG
400.0000 mg/m2 | Freq: Once | INTRAVENOUS | Status: AC
  Administered 2022-07-03: 1028 mg via INTRAVENOUS
  Filled 2022-07-03: qty 51.4

## 2022-07-03 MED ORDER — SODIUM CHLORIDE 0.9 % IV SOLN
2400.0000 mg/m2 | INTRAVENOUS | Status: DC
  Administered 2022-07-03: 6150 mg via INTRAVENOUS
  Filled 2022-07-03: qty 123

## 2022-07-03 MED ORDER — OXALIPLATIN CHEMO INJECTION 100 MG/20ML
85.0000 mg/m2 | Freq: Once | INTRAVENOUS | Status: AC
  Administered 2022-07-03: 200 mg via INTRAVENOUS
  Filled 2022-07-03: qty 40

## 2022-07-03 NOTE — Patient Instructions (Signed)
[facility]  Discharge Instructions: Thank you for choosing [facility] to provide your oncology and hematology care.   If you have a lab appointment with the Cancer Center, please go directly to the Cancer Center and check in at the registration area.   Wear comfortable clothing and clothing appropriate for easy access to any Portacath or PICC line.   We strive to give you quality time with your provider. You may need to reschedule your appointment if you arrive late (15 or more minutes).  Arriving late affects you and other patients whose appointments are after yours.  Also, if you miss three or more appointments without notifying the office, you may be dismissed from the clinic at the provider's discretion.      For prescription refill requests, have your pharmacy contact our office and allow 72 hours for refills to be completed.    Today you received the following chemotherapy and/or immunotherapy agents: Oxaliplatin, Leucovorin, Fluorouracil.       To help prevent nausea and vomiting after your treatment, we encourage you to take your nausea medication as directed.  BELOW ARE SYMPTOMS THAT SHOULD BE REPORTED IMMEDIATELY: *FEVER GREATER THAN 100.4 F (38 C) OR HIGHER *CHILLS OR SWEATING *NAUSEA AND VOMITING THAT IS NOT CONTROLLED WITH YOUR NAUSEA MEDICATION *UNUSUAL SHORTNESS OF BREATH *UNUSUAL BRUISING OR BLEEDING *URINARY PROBLEMS (pain or burning when urinating, or frequent urination) *BOWEL PROBLEMS (unusual diarrhea, constipation, pain near the anus) TENDERNESS IN MOUTH AND THROAT WITH OR WITHOUT PRESENCE OF ULCERS (sore throat, sores in mouth, or a toothache) UNUSUAL RASH, SWELLING OR PAIN  UNUSUAL VAGINAL DISCHARGE OR ITCHING   Items with * indicate a potential emergency and should be followed up as soon as possible or go to the Emergency Department if any problems should occur.  Please show the CHEMOTHERAPY ALERT CARD or  IMMUNOTHERAPY ALERT CARD at check-in to the Emergency Department and triage nurse.  Should you have questions after your visit or need to cancel or reschedule your appointment, please contact [facility]  Dept: [phone]  and follow the prompts.  Office hours are 8:00 a.m. to 4:30 p.m. Monday - Friday. Please note that voicemails left after 4:00 p.m. may not be returned until the following business day.  We are closed weekends and major holidays. You have access to a nurse at all times for urgent questions. Please call the main number to the clinic Dept: [phone] and follow the prompts.   For any non-urgent questions, you may also contact your provider using MyChart. We now offer e-Visits for anyone 18 and older to request care online for non-urgent symptoms. For details visit [email or web address].   Also download the MyChart app! Go to the app store, search "MyChart", open the app, select [facility], and log in with your MyChart username and password.   

## 2022-07-05 ENCOUNTER — Inpatient Hospital Stay: Payer: 59

## 2022-07-05 VITALS — BP 112/72 | HR 82 | Temp 98.3°F | Resp 18

## 2022-07-05 DIAGNOSIS — Z5111 Encounter for antineoplastic chemotherapy: Secondary | ICD-10-CM | POA: Diagnosis not present

## 2022-07-05 DIAGNOSIS — C7A1 Malignant poorly differentiated neuroendocrine tumors: Secondary | ICD-10-CM

## 2022-07-05 DIAGNOSIS — Z7189 Other specified counseling: Secondary | ICD-10-CM

## 2022-07-05 MED ORDER — SODIUM CHLORIDE 0.9% FLUSH
10.0000 mL | INTRAVENOUS | Status: DC | PRN
  Administered 2022-07-05: 10 mL

## 2022-07-05 MED ORDER — HEPARIN SOD (PORK) LOCK FLUSH 100 UNIT/ML IV SOLN
500.0000 [IU] | Freq: Once | INTRAVENOUS | Status: AC | PRN
  Administered 2022-07-05: 500 [IU]

## 2022-07-12 ENCOUNTER — Ambulatory Visit (HOSPITAL_COMMUNITY)
Admission: RE | Admit: 2022-07-12 | Discharge: 2022-07-12 | Disposition: A | Discharge status: Home / Self Care | Payer: 59 | Source: Ambulatory Visit | Attending: Hematology | Admitting: Hematology

## 2022-07-12 DIAGNOSIS — C7A1 Malignant poorly differentiated neuroendocrine tumors: Secondary | ICD-10-CM | POA: Insufficient documentation

## 2022-07-12 MED ORDER — IOHEXOL 300 MG/ML  SOLN
100.0000 mL | Freq: Once | INTRAMUSCULAR | Status: AC | PRN
  Administered 2022-07-12: 100 mL via INTRAVENOUS

## 2022-07-12 MED ORDER — HEPARIN SOD (PORK) LOCK FLUSH 100 UNIT/ML IV SOLN
500.0000 [IU] | Freq: Once | INTRAVENOUS | Status: AC
  Administered 2022-07-12: 500 [IU] via INTRAVENOUS

## 2022-07-16 MED FILL — Dexamethasone Sodium Phosphate Inj 100 MG/10ML: INTRAMUSCULAR | Qty: 1 | Status: AC

## 2022-07-17 ENCOUNTER — Inpatient Hospital Stay: Payer: 59

## 2022-07-17 ENCOUNTER — Inpatient Hospital Stay: Payer: 59 | Attending: Hematology | Admitting: Hematology

## 2022-07-17 ENCOUNTER — Other Ambulatory Visit: Payer: Self-pay

## 2022-07-17 VITALS — BP 126/89 | HR 92 | Temp 97.7°F | Resp 19 | Ht 72.0 in | Wt 282.2 lb

## 2022-07-17 DIAGNOSIS — F17210 Nicotine dependence, cigarettes, uncomplicated: Secondary | ICD-10-CM | POA: Diagnosis not present

## 2022-07-17 DIAGNOSIS — C7A1 Malignant poorly differentiated neuroendocrine tumors: Secondary | ICD-10-CM

## 2022-07-17 DIAGNOSIS — Z5111 Encounter for antineoplastic chemotherapy: Secondary | ICD-10-CM | POA: Insufficient documentation

## 2022-07-17 DIAGNOSIS — Z7189 Other specified counseling: Secondary | ICD-10-CM | POA: Diagnosis not present

## 2022-07-17 DIAGNOSIS — Z95828 Presence of other vascular implants and grafts: Secondary | ICD-10-CM

## 2022-07-17 LAB — CBC WITH DIFFERENTIAL (CANCER CENTER ONLY)
Abs Immature Granulocytes: 0.01 10*3/uL (ref 0.00–0.07)
Basophils Absolute: 0 10*3/uL (ref 0.0–0.1)
Basophils Relative: 1 %
Eosinophils Absolute: 0.1 10*3/uL (ref 0.0–0.5)
Eosinophils Relative: 4 %
HCT: 35 % — ABNORMAL LOW (ref 39.0–52.0)
Hemoglobin: 11.8 g/dL — ABNORMAL LOW (ref 13.0–17.0)
Immature Granulocytes: 0 %
Lymphocytes Relative: 16 %
Lymphs Abs: 0.6 10*3/uL — ABNORMAL LOW (ref 0.7–4.0)
MCH: 31.1 pg (ref 26.0–34.0)
MCHC: 33.7 g/dL (ref 30.0–36.0)
MCV: 92.3 fL (ref 80.0–100.0)
Monocytes Absolute: 0.4 10*3/uL (ref 0.1–1.0)
Monocytes Relative: 11 %
Neutro Abs: 2.3 10*3/uL (ref 1.7–7.7)
Neutrophils Relative %: 68 %
Platelet Count: 111 10*3/uL — ABNORMAL LOW (ref 150–400)
RBC: 3.79 MIL/uL — ABNORMAL LOW (ref 4.22–5.81)
RDW: 16.1 % — ABNORMAL HIGH (ref 11.5–15.5)
WBC Count: 3.4 10*3/uL — ABNORMAL LOW (ref 4.0–10.5)
nRBC: 0 % (ref 0.0–0.2)

## 2022-07-17 LAB — CMP (CANCER CENTER ONLY)
ALT: 215 U/L — ABNORMAL HIGH (ref 0–44)
AST: 93 U/L — ABNORMAL HIGH (ref 15–41)
Albumin: 3.6 g/dL (ref 3.5–5.0)
Alkaline Phosphatase: 376 U/L — ABNORMAL HIGH (ref 38–126)
Anion gap: 7 (ref 5–15)
BUN: 9 mg/dL (ref 6–20)
CO2: 27 mmol/L (ref 22–32)
Calcium: 9.5 mg/dL (ref 8.9–10.3)
Chloride: 107 mmol/L (ref 98–111)
Creatinine: 0.59 mg/dL — ABNORMAL LOW (ref 0.61–1.24)
GFR, Estimated: >60 mL/min (ref >60)
Glucose, Bld: 120 mg/dL — ABNORMAL HIGH (ref 70–99)
Potassium: 3.4 mmol/L — ABNORMAL LOW (ref 3.5–5.1)
Sodium: 141 mmol/L (ref 135–145)
Total Bilirubin: 0.9 mg/dL (ref 0.3–1.2)
Total Protein: 7.4 g/dL (ref 6.5–8.1)

## 2022-07-17 MED ORDER — SODIUM CHLORIDE 0.9 % IV SOLN
2400.0000 mg/m2 | INTRAVENOUS | Status: DC
  Administered 2022-07-17: 6150 mg via INTRAVENOUS
  Filled 2022-07-17: qty 123

## 2022-07-17 MED ORDER — SODIUM CHLORIDE 0.9 % IV SOLN
400.0000 mg/m2 | Freq: Once | INTRAVENOUS | Status: AC
  Administered 2022-07-17: 1028 mg via INTRAVENOUS
  Filled 2022-07-17: qty 51.4

## 2022-07-17 MED ORDER — SODIUM CHLORIDE 0.9 % IV SOLN
10.0000 mg | Freq: Once | INTRAVENOUS | Status: AC
  Administered 2022-07-17: 10 mg via INTRAVENOUS
  Filled 2022-07-17: qty 10

## 2022-07-17 MED ORDER — FLUOROURACIL CHEMO INJECTION 2.5 GM/50ML
400.0000 mg/m2 | Freq: Once | INTRAVENOUS | Status: AC
  Administered 2022-07-17: 1000 mg via INTRAVENOUS
  Filled 2022-07-17: qty 20

## 2022-07-17 MED ORDER — DEXTROSE 5 % IV SOLN: Freq: Once | INTRAVENOUS | Status: AC

## 2022-07-17 MED ORDER — HEPARIN SOD (PORK) LOCK FLUSH 100 UNIT/ML IV SOLN: 500.0000 [IU] | Freq: Once | INTRAVENOUS | Status: DC | PRN

## 2022-07-17 MED ORDER — PALONOSETRON HCL INJECTION 0.25 MG/5ML
0.2500 mg | Freq: Once | INTRAVENOUS | Status: AC
  Administered 2022-07-17: 0.25 mg via INTRAVENOUS
  Filled 2022-07-17: qty 5

## 2022-07-17 MED ORDER — SODIUM CHLORIDE 0.9% FLUSH: 10.0000 mL | INTRAVENOUS | Status: DC | PRN

## 2022-07-17 MED ORDER — SODIUM CHLORIDE 0.9% FLUSH
10.0000 mL | Freq: Once | INTRAVENOUS | Status: AC
  Administered 2022-07-17: 10 mL

## 2022-07-17 NOTE — Progress Notes (Signed)
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  HISTORY OF PRESENTING ILLNESS:  Leon Fischer. is a wonderful 32 y.o. 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 He presents today accompanied by his mother. He reports He is doing well.  He reports no color change in stool.  He notes that he is eating well but feels food just sits in stomach.  We discussed getting additional scans and labs for further evaluation and treatment which he was agreeable to. We further discussed getting MRI of brain and PET scan which he was agreeable to.  He notes that he is trying to hold it together mentally and is still trying to process his new diagnosis. We discussed the options of psychological or spiritual counseling which he has not decided on at this time. He was advised that may call back if he decides that he would like to pursue either.  We discussed getting a port a cath installed which he was agreeable to.  He recently had a biliary stent put into common bile duct.   No abdominal pain or change in bowel habits. No new or unexpected weight loss. No other new or acute focal symptoms.  We discussed his recent stomach biopsy done 07/13/2021 which sowed gastropathy and minor chronic gastritis with lymphoid aggregate. Negative for H. Pylori.  We discussed recent cytology done 07/13/2021 which showed presence of malignant cells in pancreas and poorly differentiated/high grade neuroendocrine carcinoma.  We discussed upper endoscopy done 07/13/2021 revealed mass in pancreatic head and  some enlarged lymph nodes were seen in the celiac and peripancreatic regions.  Labs done today were reviewed in detail.  INTERVAL HISTORY: Leon Fischer. Is a 32 y.o. 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  Patient was last seen by me on 06/19/2022 and complained of mild fatigue and tingling/numbness when touching cold objects.   Today, he is scheduled to receive day 1 cycle 6 of his FOLFOX treatment and [place] accompanied by his mother. He notes he is doing well overall without any new or severe medical   He denies any new symptoms, changes in bowel movements, chest pain, SOB, cough, or colds, constipation, diarrhea, back pain, abdominal pain, or issues with his port placement. He does stay well hydrated.   He denies any toxicities with his current treatment. He denies any significant neuropathy of hands/feet, but has stable tingling when touching cold objects.   Patient notes he has seasonal allergies and endorses symptoms such as sneezing but denies any phlegm. Patient has been eating well and his weight has been stable.   MEDICAL HISTORY:  Past Medical History:  Diagnosis Date   Asthma    Neuroendocrine cancer (HCC)     SURGICAL HISTORY: Past Surgical History:  Procedure Laterality Date   BILIARY DILATION  07/13/2021   Procedure: BILIARY DILATION;  Surgeon: Vida Rigger, MD;  Location: [facility];  Service: Gastroenterology;;   BILIARY STENT PLACEMENT N/A 07/13/2021   Procedure: BILIARY STENT PLACEMENT;  Surgeon: Vida Rigger, MD;  Location: [name];  Service: Gastroenterology;  Laterality: N/A;   BILIARY STENT PLACEMENT N/A  11/05/2021   Procedure: BILIARY STENT PLACEMENT;  Surgeon: Vida Rigger, MD;  Location: [name];  Service: Gastroenterology;  Laterality: N/A;   BIOPSY  07/13/2021   Procedure: BIOPSY;  Surgeon: Meridee Score Netty Starring., MD;  Location: [facility];   Service: Gastroenterology;;   CHOLECYSTECTOMY N/A 11/07/2021   Procedure: LAPAROSCOPIC CHOLECYSTECTOMY;  Surgeon: Berna Bue, MD;  Location: [facility];  Service: General;  Laterality: N/A;   ENDOSCOPIC RETROGRADE CHOLANGIOPANCREATOGRAPHY (ERCP) WITH PROPOFOL N/A 07/13/2021   Procedure: ENDOSCOPIC RETROGRADE CHOLANGIOPANCREATOGRAPHY (ERCP) WITH PROPOFOL;  Surgeon: Vida Rigger, MD;  Location: [name];  Service: Gastroenterology;  Laterality: N/A;   ERCP N/A 11/05/2021   Procedure: ENDOSCOPIC RETROGRADE CHOLANGIOPANCREATOGRAPHY (ERCP);  Surgeon: Vida Rigger, MD;  Location: [facility];  Service: Gastroenterology;  Laterality: N/A;   ESOPHAGOGASTRODUODENOSCOPY (EGD) WITH PROPOFOL N/A 07/13/2021   Procedure: ESOPHAGOGASTRODUODENOSCOPY (EGD) WITH PROPOFOL;  Surgeon: Meridee Score Netty Starring., MD;  Location: [name];  Service: Gastroenterology;  Laterality: N/A;   EUS N/A 07/13/2021   Procedure: UPPER ENDOSCOPIC ULTRASOUND (EUS) LINEAR;  Surgeon: Lemar Lofty., MD;  Location: [name];  Service: Gastroenterology;  Laterality: N/A;   FINE NEEDLE ASPIRATION  07/13/2021   Procedure: FINE NEEDLE ASPIRATION (FNA) LINEAR;  Surgeon: Lemar Lofty., MD;  Location: [facility];  Service: Gastroenterology;;   IR IMAGING GUIDED PORT INSERTION  07/31/2021   SPHINCTEROTOMY  07/13/2021   Procedure: Dennison Mascot;  Surgeon: Vida Rigger, MD;  Location: [name];  Service: Gastroenterology;;    SOCIAL HISTORY: Social History   Socioeconomic History   Marital status: Single    Spouse name: Not on file   Number of children: 0   Years of education: Not on file   Highest education level: Some college, no degree  Occupational History   Not on file  Tobacco Use   Smoking status: Some Days    Types: Cigarettes   Smokeless tobacco: Never  Vaping Use   Vaping Use: Never used  Substance and Sexual Activity   Alcohol use: Yes    Comment: social   Drug use: No   Sexual activity: Never   Other Topics Concern   Not on file  Social History Narrative   Not on file   Social Determinants of Health   Financial Resource Strain: Low Risk  (04/14/2017)   Overall Financial Resource Strain (CARDIA)    Difficulty of Paying Living Expenses: Not hard at all  Food Insecurity: No Food Insecurity (11/07/2021)   Hunger Vital Sign    Worried About Running Out of Food in the Last Year: Never true    Ran Out of Food in the Last Year: Never true  Transportation Needs: No Transportation Needs (11/07/2021)   PRAPARE - Administrator, Civil Service (Medical): No    Lack of Transportation (Non-Medical): No  Physical Activity: Inactive (04/14/2017)   Exercise Vital Sign    Days of Exercise per Week: 0 days    Minutes of Exercise per Session: 0 min  Stress: Stress Concern Present (04/14/2017)   [name] of Occupational Health - Occupational Stress Questionnaire    Feeling of Stress : Rather much  Social Connections: Moderately Isolated (04/14/2017)   Social Connection and Isolation Panel [NHANES]    Frequency of Communication with Friends and Family: More than three times a week    Frequency of Social Gatherings with Friends and Family: More than three times a week    Attends Religious Services: Never    Database administrator or Organizations: No  Attends Banker Meetings: Never    Marital Status: Never married  Intimate Partner Violence: Not At Risk (11/07/2021)   Humiliation, Afraid, Rape, and Kick questionnaire    Fear of Current or Ex-Partner: No    Emotionally Abused: No    Physically Abused: No    Sexually Abused: No    FAMILY HISTORY: Family History  Problem Relation Age of Onset   Drug abuse Father    ALLERGIES:  is allergic to shellfish allergy and other.  MEDICATIONS:  Current Outpatient Medications  Medication Sig Dispense Refill   acetaminophen (TYLENOL) 500 MG tablet Take 1,000 mg by mouth every 6 (six) hours as needed for mild pain.      albuterol (VENTOLIN HFA) 108 (90 Base) MCG/ACT inhaler INHALE 2 PUFFS BY MOUTH EVERY 4 HOURS AS NEEDED FOR WHEEZING FOR SHORTNESS OF BREATH 18 g 0   dexamethasone (DECADRON) 4 MG tablet Take 2 tablets (8 mg total) by mouth daily. Start the day after chemotherapy for 2 days. Take with food. 30 tablet 1   diclofenac Sodium (VOLTAREN) 1 % GEL Apply 4 g topically 4 (four) times daily. (Patient taking differently: Apply 4 g topically 4 (four) times daily as needed (pain).) 100 g 0   lidocaine-prilocaine (EMLA) cream Apply to affected area once 30 g 3   methocarbamol (ROBAXIN) 500 MG tablet Take 1 tablet (500 mg total) by mouth 2 (two) times daily. 20 tablet 0   ondansetron (ZOFRAN) 8 MG tablet Take 1 tablet (8 mg total) by mouth every 8 (eight) hours as needed for nausea or vomiting. Start on the third day after chemotherapy. 30 tablet 1   oxyCODONE (OXY IR/ROXICODONE) 5 MG immediate release tablet Take 1 tablet (5 mg total) by mouth every 6 (six) hours as needed for severe pain or moderate pain. 20 tablet 0   polyethylene glycol (MIRALAX / GLYCOLAX) 17 g packet Take 17 g by mouth daily. (Patient taking differently: Take 17 g by mouth daily as needed for mild constipation or moderate constipation.) 14 each 0   prochlorperazine (COMPAZINE) 10 MG tablet Take 1 tablet (10 mg total) by mouth every 6 (six) hours as needed for nausea or vomiting. 30 tablet 1   senna-docusate (SENOKOT-S) 8.6-50 MG tablet Take 2 tablets by mouth 2 (two) times daily. (Patient taking differently: Take 2 tablets by mouth daily as needed for mild constipation or moderate constipation.) 30 tablet 0   No current facility-administered medications for this visit.    REVIEW OF SYSTEMS:    10 Point review of Systems was done is negative except as noted above.  PHYSICAL EXAMINATION: ECOG PERFORMANCE STATUS: 1 - Symptomatic but completely ambulatory  Vitals:   07/17/22 1001  BP: 126/89  Pulse: 92  Resp: 19  Temp: 97.7 F (36.5  C)  SpO2: 100%    GENERAL:alert, in no acute distress and comfortable SKIN: no acute rashes, no significant lesions EYES: conjunctiva are pink and non-injected, sclera anicteric OROPHARYNX: MMM, no exudates, no oropharyngeal erythema or ulceration NECK: supple, no JVD LYMPH:  no palpable lymphadenopathy in the cervical, axillary or inguinal regions LUNGS: clear to auscultation b/l with normal respiratory effort HEART: regular rate & rhythm ABDOMEN:  normoactive bowel sounds , non tender, not distended. Extremity: no pedal edema PSYCH: alert & oriented x 3 with fluent speech NEURO: no focal motor/sensory deficits  LABORATORY DATA:  I have reviewed the data as listed    Latest Ref Rng & Units 07/17/2022    9:09  AM 07/03/2022    9:10 AM 06/19/2022   11:50 AM  CBC  WBC 4.0 - 10.5 K/uL 3.4  4.2  5.0   Hemoglobin 13.0 - 17.0 g/dL 40.9  81.1  91.4   Hematocrit 39.0 - 52.0 % 35.0  33.2  34.4   Platelets 150 - 400 K/uL 111  109  139       Latest Ref Rng & Units 07/17/2022    9:09 AM 07/03/2022    9:10 AM 06/19/2022   11:50 AM  CMP  Glucose 70 - 99 mg/dL 782  956  213   BUN 6 - 20 mg/dL 9  9  9    Creatinine 0.61 - 1.24 mg/dL 0.86  5.78  4.69   Sodium 135 - 145 mmol/L 141  140  139   Potassium 3.5 - 5.1 mmol/L 3.4  3.4  3.9   Chloride 98 - 111 mmol/L 107  107  106   CO2 22 - 32 mmol/L 27  28  29    Calcium 8.9 - 10.3 mg/dL 9.5  8.8  9.2   Total Protein 6.5 - 8.1 g/dL 7.4  7.2  7.3   Total Bilirubin 0.3 - 1.2 mg/dL 0.9  0.6  0.5   Alkaline Phos 38 - 126 U/L 376  139  189   AST 15 - 41 U/L 93  35  30   ALT 0 - 44 U/L 215  67  82    Cytology done 07/13/2021 revealed "FINAL MICROSCOPIC DIAGNOSIS:  A. PANCREAS, HEAD, FINE NEEDLE ASPIRATION:  - Malignant cells present  - Poorly differentiated/high-grade neuroendocrine carcinoma (see  comment)   B. CBD STRICTURE, BRUSHING:  - Atypical cells suspicious for tumor   C. BILIARY DILATION, BALLOON, REMOVAL:  - Atypical cells suspicious  for tumor "  Surgical pathology done 07/13/2021 revealed "FINAL MICROSCOPIC DIAGNOSIS:   A. STOMACH, BIOPSY:  Reactive gastropathy and minimal chronic gastritis with lymphoid  aggregate  Negative for H. pylori, intestinal metaplasia, dysplasia and carcinoma"   Pathology 11/07/21: FINAL MICROSCOPIC DIAGNOSIS:   A. GALLBLADDER, CHOLECYSTECTOMY:  - Acute cholecystitis with necrosis and abscess.   RADIOGRAPHIC STUDIES: I have personally reviewed the radiological images as listed and agreed with the findings in the report. CT CHEST ABDOMEN PELVIS W CONTRAST  Result Date: 07/16/2022 CLINICAL DATA:  Metastatic disease evaluation, assess treatment response, poorly differentiated neuroendocrine tumor * Tracking Code: BO * EXAM: CT CHEST, ABDOMEN, AND PELVIS WITH CONTRAST TECHNIQUE: Multidetector CT imaging of the chest, abdomen and pelvis was performed following the standard protocol during bolus administration of intravenous contrast. RADIATION DOSE REDUCTION: This exam was performed according to the departmental dose-optimization program which includes automated exposure control, adjustment of the mA and/or kV according to patient size and/or use of iterative reconstruction technique. CONTRAST:  OMNIPAQUE IOHEXOL 300 MG/ML  SOLN COMPARISON:  04/04/2022 FINDINGS: CT CHEST FINDINGS Cardiovascular: Right chest port catheter. Normal heart size. No pericardial effusion. Mediastinum/Nodes: No enlarged mediastinal, hilar, or axillary lymph nodes. Thyroid gland, trachea, and esophagus demonstrate no significant findings. Lungs/Pleura: Diffuse bilateral bronchial wall thickening. No pleural effusion or pneumothorax. Musculoskeletal: No chest wall abnormality. No acute osseous findings. CT ABDOMEN PELVIS FINDINGS Hepatobiliary: Multiple ill-defined hypodense liver lesions are diminished in size, index lesion in the posterior liver dome, hepatic segment VII, measuring 1.2 x 1.0 cm, previously 2.8 x 2.2 cm  (series 2, image 45), lesion in the superior left lobe of the liver, hepatic segment III measuring 1.4 x 1.5 cm, previously 2.5  x 2.4 cm (series 2, image 49). Status post cholecystectomy. Post stenting pneumobilia without significant biliary ductal dilatation. Pancreas: Common bile duct stent in position with tip in the duodenum. Unchanged appearance of an ill-defined mass of the superior pancreatic head (series 2, image 57). Diffuse atrophy and pancreatic ductal dilatation distal to the pancreatic head, the duct measuring up to 0.6 cm in caliber (series 2, image 60). Spleen: Normal in size without significant abnormality. Adrenals/Urinary Tract: Adrenal glands are unremarkable. Kidneys are normal, without renal calculi, solid lesion, or hydronephrosis. Bladder is unremarkable. Stomach/Bowel: Stomach is within normal limits. Appendix appears normal. No evidence of bowel wall thickening, distention, or inflammatory changes. Vascular/Lymphatic: No significant vascular findings are present. No significant change in enlarged retroperitoneal and portacaval lymph nodes Reproductive: No mass or other abnormality. Other: No abdominal wall hernia or abnormality. Small volume pelvic ascites, increased compared to prior examination (series 2, image 115). Musculoskeletal: No acute osseous findings. IMPRESSION: 1. Multiple ill-defined hypodense liver lesions are diminished in size, consistent with treatment response. 2. Unchanged appearance of an ill-defined mass of the superior pancreatic head. 3. No significant change in enlarged metastatic retroperitoneal and portacaval lymph nodes. 4. Common bile duct stent in position with tip in the duodenum. Post stenting pneumobilia without significant biliary ductal dilatation. 5. Small volume pelvic ascites, increased compared to prior examination. This is nonspecific but presumed malignant, however there is no direct evidence of peritoneal metastatic disease. 6. Diffuse bilateral  bronchial wall thickening, consistent with nonspecific infectious or inflammatory bronchitis. Electronically Signed   By: Jearld Lesch M.D.   On: 07/16/2022 13:29     ASSESSMENT & PLAN:   32 y.o. very pleasant male with  1. 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 -Cytology done 07/13/2021 shows presence of malignant cells in pancreas and poorly differentiated/high grade neuroendocrine carcinoma. - CT Chest Abdomen Pelvis results from 04/04/2022 with the patient. Showed: No evidence of thoracic metastasis. Interval increase in size and prominence multifocal hepatic metastasis. Interval increase in peripancreatic and pericaval retroperitoneal lymphadenopathy. No biliary duct dilatation with stent placed. Interval subtle increase in size of mass in the pancreatic head. Mild pancreatic duct dilatation similar prior.  PLAN: -Discussed lab results on 07/17/2022 in detail with patient. CBC showed WBC of 3.4 K, hemoglobin of 11.8, and platelets of 111 K.  -Liver enzymes are still abnormal but show signs of improvement. CMP shows AST at 93 U/L and ALT at 215 U/L. -CT chest/abdomen/pelvis scan from 07/12/2022 showed improvement in nodules in the liver, from 2 cm to 1 cm, no other significant new lesions, but increased fluid in the abdomen, which may be due to treatment, and incidental finding of inflammation in the airways. Scan also shows normal pancreas and spleen. Mass in pancreas unchanged.  -Patient will receive Cycle 6 Day 1 of Folfox  -Informed patient of increased risk of neuropathy in cycle 6 of Folfox -Patient notes no new acute toxicities from his FOLFOX chemotherapy treatmnent -discussed proceeding treatment options such as: Stop Oxaloplatin due to abnormal liver enzymes and continue Fluorouracil maintenance treatment Switch Oxiloplatin to Irinotecan Continue current treatment regimen unless there is  any significant side effect concerns -will proceed with only Fluorouracil at this time -will switch F[facility]ox to [facility] -Discussed the option of doing liver biopsy or changing chemotherapy if liver enzymes do not improve  FOLLOW-UP: Switching FOLFOX ro FOLFIRI  The total time spent in the appointment was 30 minutes* .  All of the patient's questions were answered with apparent satisfaction. The patient knows to call the clinic with any problems, questions or concerns.   Wyvonnia Lora MD MS AAHIVMS [facility] Hematology/Oncology Physician [facility]  .*Total Encounter Time as defined by the Centers for Medicare and Medicaid Services includes, in addition to the face-to-face time of a patient visit (documented in the note above) non-face-to-face time: obtaining and reviewing outside history, ordering and reviewing medications, tests or procedures, care coordination (communications with other health care professionals or caregivers) and documentation in the medical record.   I,Mitra Faeizi,acting as a Neurosurgeon for Wyvonnia Lora, MD.,have documented all relevant documentation on the behalf of Wyvonnia Lora, MD,as directed by  Wyvonnia Lora, MD while in the presence of Wyvonnia Lora, MD.  .I have reviewed the above documentation for accuracy and completeness, and I agree with the above. [name] MD

## 2022-07-17 NOTE — Progress Notes (Signed)
Patient seen by Dr. Addison Naegeli are within treatment parameters.  Labs reviewed: and are not all within treatment parameters. Dr Candise Che aware AST: 93, ALT 215, Alkaline Phos: 376,  Plts 11/ He has reviewed all of pt's labs  Per physician team, patient is ready for treatment. Please note that modifications are being made to the treatment plan including Pt will only get 5FU today

## 2022-07-19 ENCOUNTER — Inpatient Hospital Stay: Payer: 59

## 2022-07-19 ENCOUNTER — Other Ambulatory Visit: Payer: Self-pay

## 2022-07-19 VITALS — BP 106/76 | HR 93 | Temp 98.6°F | Resp 20

## 2022-07-19 DIAGNOSIS — Z7189 Other specified counseling: Secondary | ICD-10-CM

## 2022-07-19 DIAGNOSIS — Z5111 Encounter for antineoplastic chemotherapy: Secondary | ICD-10-CM | POA: Diagnosis not present

## 2022-07-19 DIAGNOSIS — C7A1 Malignant poorly differentiated neuroendocrine tumors: Secondary | ICD-10-CM

## 2022-07-19 MED ORDER — HEPARIN SOD (PORK) LOCK FLUSH 100 UNIT/ML IV SOLN
500.0000 [IU] | Freq: Once | INTRAVENOUS | Status: AC | PRN
  Administered 2022-07-19: 500 [IU]

## 2022-07-19 MED ORDER — SODIUM CHLORIDE 0.9% FLUSH
10.0000 mL | INTRAVENOUS | Status: DC | PRN
  Administered 2022-07-19: 10 mL

## 2022-07-19 NOTE — Progress Notes (Signed)
Pt. Came in for pump stop/discontinued with 3.2 ML left.  He didn't want to wait for it to complete.  Called Beth/RN, Dr. Clyda Greener nurse.  She spoke with the doctor and states it's ok to go ahead and do a bolus for the remaining of chemo medication left.  Nikki/RN Press photographer made aware of the above.  Nikki did a bolus on the remaining medication.  Pump discontinued as normal.

## 2022-07-19 NOTE — Progress Notes (Signed)
Pt came for pump stop today and has 3 ml left in take home pump. Per Dr Candise Che it is okay to bolus 3 ml prior to stopping pump today.

## 2022-07-20 ENCOUNTER — Other Ambulatory Visit: Payer: Self-pay

## 2022-07-23 NOTE — Progress Notes (Incomplete)
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  HISTORY OF PRESENTING ILLNESS:  Leon Fischer. is a wonderful 32 y.o. 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 He presents today accompanied by his mother. He reports He is doing well.  He reports no color change in stool.  He notes that he is eating well but feels food just sits in stomach.  We discussed getting additional scans and labs for further evaluation and treatment which he was agreeable to. We further discussed getting MRI of brain and PET scan which he was agreeable to.  He notes that he is trying to hold it together mentally and is still trying to process his new diagnosis. We discussed the options of psychological or spiritual counseling which he has not decided on at this time. He was advised that may call back if he decides that he would like to pursue either.  We discussed getting a port a cath installed which he was agreeable to.  He recently had a biliary stent put into common bile duct.   No abdominal pain or change in bowel habits. No new or unexpected weight loss. No other new or acute focal symptoms.  We discussed his recent stomach biopsy done 07/13/2021 which sowed gastropathy and minor chronic gastritis with lymphoid aggregate. Negative for H. Pylori.  We discussed recent cytology done 07/13/2021 which showed presence of malignant cells in pancreas and poorly differentiated/high grade neuroendocrine carcinoma.  We discussed upper endoscopy done 07/13/2021 revealed mass in pancreatic head and  some enlarged lymph nodes were seen in the celiac and peripancreatic regions.  Labs done today were reviewed in detail.  INTERVAL HISTORY: Leon Fischer. Is a 32 y.o. 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  Patient was last seen by me on 06/19/2022 and complained of mild fatigue and tingling/numbness when touching cold objects.   Today, he is scheduled to receive day 1 cycle 6 of his FOLFOX treatment and [place] accompanied by his mother. He notes he is doing well overall without any new or severe medical   He denies any new symptoms, changes in bowel movements, chest pain, SOB, cough, or colds, constipation, diarrhea, back pain, abdominal pain, or issues with his port placement. He does stay well hydrated.   He denies any toxicities with his current treatment. He denies any significant neuropathy of hands/feet, but has stable tingling when touching cold objects.   Patient notes he has seasonal allergies and endorses symptoms such as sneezing but denies any phlegm. Patient has been eating well and his weight has been stable.   MEDICAL HISTORY:  Past Medical History:  Diagnosis Date  . Asthma   . Neuroendocrine cancer [facility])     SURGICAL HISTORY: Past Surgical History:  Procedure Laterality Date  . BILIARY DILATION  07/13/2021   Procedure: BILIARY DILATION;  Surgeon: Vida Rigger, MD;  Location: [facility];  Service: Gastroenterology;;  . BILIARY STENT PLACEMENT N/A 07/13/2021   Procedure: BILIARY STENT PLACEMENT;  Surgeon: Vida Rigger, MD;  Location: [name];  Service: Gastroenterology;  Laterality: N/A;  . BILIARY STENT PLACEMENT N/A  11/05/2021   Procedure: BILIARY STENT PLACEMENT;  Surgeon: Vida Rigger, MD;  Location: [name];  Service: Gastroenterology;  Laterality: N/A;  . BIOPSY  07/13/2021   Procedure: BIOPSY;  Surgeon: Meridee Score Netty Starring., MD;  Location: [facility];   Service: Gastroenterology;;  . [place] N/A 11/07/2021   Procedure: LAPAROSCOPIC CHOLECYSTECTOMY;  Surgeon: Berna Bue, MD;  Location: [facility];  Service: General;  Laterality: N/A;  . ENDOSCOPIC RETROGRADE CHOLANGIOPANCREATOGRAPHY (ERCP) WITH PROPOFOL N/A 07/13/2021   Procedure: ENDOSCOPIC RETROGRADE CHOLANGIOPANCREATOGRAPHY (ERCP) WITH PROPOFOL;  Surgeon: Vida Rigger, MD;  Location: [name];  Service: Gastroenterology;  Laterality: N/A;  . ERCP N/A 11/05/2021   Procedure: ENDOSCOPIC RETROGRADE CHOLANGIOPANCREATOGRAPHY (ERCP);  Surgeon: Vida Rigger, MD;  Location: [facility];  Service: Gastroenterology;  Laterality: N/A;  . ESOPHAGOGASTRODUODENOSCOPY (EGD) WITH PROPOFOL N/A 07/13/2021   Procedure: ESOPHAGOGASTRODUODENOSCOPY (EGD) WITH PROPOFOL;  Surgeon: Meridee Score Netty Starring., MD;  Location: [name];  Service: Gastroenterology;  Laterality: N/A;  . EUS N/A 07/13/2021   Procedure: UPPER ENDOSCOPIC ULTRASOUND (EUS) LINEAR;  Surgeon: Lemar Lofty., MD;  Location: [name];  Service: Gastroenterology;  Laterality: N/A;  . FINE NEEDLE ASPIRATION  07/13/2021   Procedure: FINE NEEDLE ASPIRATION (FNA) LINEAR;  Surgeon: Lemar Lofty., MD;  Location: [name];  Service: Gastroenterology;;  . IR IMAGING GUIDED PORT INSERTION  07/31/2021  . SPHINCTEROTOMY  07/13/2021   Procedure: SPHINCTEROTOMY;  Surgeon: Vida Rigger, MD;  Location: [facility];  Service: Gastroenterology;;    SOCIAL HISTORY: Social History   Socioeconomic History  . Marital status: Single    Spouse name: Not on file  . Number of children: 0  . Years of education: Not on file  . Highest education level: Some college, no degree  Occupational History  . Not on file  Tobacco Use  . Smoking status: Some Days    Types: Cigarettes  . Smokeless tobacco: Never  Vaping Use  . Vaping Use: Never used  Substance and Sexual Activity  . Alcohol use: Yes    Comment: social  . Drug use: No  .  Sexual activity: Never  Other Topics Concern  . Not on file  Social History Narrative  . Not on file   Social Determinants of Health   Financial Resource Strain: Low Risk  (04/14/2017)   Overall Financial Resource Strain (CARDIA)   . Difficulty of Paying Living Expenses: Not hard at all  Food Insecurity: No Food Insecurity (11/07/2021)   Hunger Vital Sign   . Worried About Programme researcher, broadcasting/film/video in the Last Year: Never true   . Ran Out of Food in the Last Year: Never true  Transportation Needs: No Transportation Needs (11/07/2021)   PRAPARE - Transportation   . Lack of Transportation (Medical): No   . Lack of Transportation (Non-Medical): No  Physical Activity: Inactive (04/14/2017)   Exercise Vital Sign   . Days of Exercise per Week: 0 days   . Minutes of Exercise per Session: 0 min  Stress: Stress Concern Present (04/14/2017)   [name] of Occupational Health - Occupational Stress Questionnaire   . Feeling of Stress : Rather much  Social Connections: Moderately Isolated (04/14/2017)   Social Connection and Isolation Panel [NHANES]   . Frequency of Communication with Friends and Family: More than three times a week   . Frequency of Social Gatherings with Friends and Family: More than three times a week   . Attends Religious Services: Never   . Active Member of Clubs or Organizations: No   .  Attends Banker Meetings: Never   . Marital Status: Never married  Intimate Partner Violence: Not At Risk (11/07/2021)   Humiliation, Afraid, Rape, and Kick questionnaire   . Fear of Current or Ex-Partner: No   . Emotionally Abused: No   . Physically Abused: No   . Sexually Abused: No    FAMILY HISTORY: Family History  Problem Relation Age of Onset  . Drug abuse Father    ALLERGIES:  is allergic to shellfish allergy and other.  MEDICATIONS:  Current Outpatient Medications  Medication Sig Dispense Refill  . acetaminophen (TYLENOL) 500 MG tablet Take 1,000 mg by mouth  every 6 (six) hours as needed for mild pain.    Marland Kitchen albuterol (VENTOLIN HFA) 108 (90 Base) MCG/ACT inhaler INHALE 2 PUFFS BY MOUTH EVERY 4 HOURS AS NEEDED FOR WHEEZING FOR SHORTNESS OF BREATH 18 g 0  . dexamethasone (DECADRON) 4 MG tablet Take 2 tablets (8 mg total) by mouth daily. Start the day after chemotherapy for 2 days. Take with food. 30 tablet 1  . diclofenac Sodium (VOLTAREN) 1 % GEL Apply 4 g topically 4 (four) times daily. (Patient taking differently: Apply 4 g topically 4 (four) times daily as needed (pain).) 100 g 0  . lidocaine-prilocaine (EMLA) cream Apply to affected area once 30 g 3  . methocarbamol (ROBAXIN) 500 MG tablet Take 1 tablet (500 mg total) by mouth 2 (two) times daily. 20 tablet 0  . ondansetron (ZOFRAN) 8 MG tablet Take 1 tablet (8 mg total) by mouth every 8 (eight) hours as needed for nausea or vomiting. Start on the third day after chemotherapy. 30 tablet 1  . oxyCODONE (OXY IR/ROXICODONE) 5 MG immediate release tablet Take 1 tablet (5 mg total) by mouth every 6 (six) hours as needed for severe pain or moderate pain. 20 tablet 0  . polyethylene glycol (MIRALAX / GLYCOLAX) 17 g packet Take 17 g by mouth daily. (Patient taking differently: Take 17 g by mouth daily as needed for mild constipation or moderate constipation.) 14 each 0  . prochlorperazine (COMPAZINE) 10 MG tablet Take 1 tablet (10 mg total) by mouth every 6 (six) hours as needed for nausea or vomiting. 30 tablet 1  . senna-docusate (SENOKOT-S) 8.6-50 MG tablet Take 2 tablets by mouth 2 (two) times daily. (Patient taking differently: Take 2 tablets by mouth daily as needed for mild constipation or moderate constipation.) 30 tablet 0   No current facility-administered medications for this visit.    REVIEW OF SYSTEMS:    10 Point review of Systems was done is negative except as noted above.  PHYSICAL EXAMINATION: ECOG PERFORMANCE STATUS: 1 - Symptomatic but completely ambulatory  Vitals:   07/17/22 1001   BP: 126/89  Pulse: 92  Resp: 19  Temp: 97.7 F (36.5 C)  SpO2: 100%    GENERAL:alert, in no acute distress and comfortable SKIN: no acute rashes, no significant lesions EYES: conjunctiva are pink and non-injected, sclera anicteric OROPHARYNX: MMM, no exudates, no oropharyngeal erythema or ulceration NECK: supple, no JVD LYMPH:  no palpable lymphadenopathy in the cervical, axillary or inguinal regions LUNGS: clear to auscultation b/l with normal respiratory effort HEART: regular rate & rhythm ABDOMEN:  normoactive bowel sounds , non tender, not distended. Extremity: no pedal edema PSYCH: alert & oriented x 3 with fluent speech NEURO: no focal motor/sensory deficits  LABORATORY DATA:  I have reviewed the data as listed    Latest Ref Rng & Units 07/03/2022    9:10  AM 06/19/2022   11:50 AM 06/05/2022    8:24 AM  CBC  WBC 4.0 - 10.5 K/uL 4.2  5.0  4.4   Hemoglobin 13.0 - 17.0 g/dL 40.9  81.1  91.4   Hematocrit 39.0 - 52.0 % 33.2  34.4  34.5   Platelets 150 - 400 K/uL 109  139  269       Latest Ref Rng & Units 07/03/2022    9:10 AM 06/19/2022   11:50 AM 06/05/2022    8:24 AM  CMP  Glucose 70 - 99 mg/dL 782  956  99   BUN 6 - 20 mg/dL 9  9  12    Creatinine 0.61 - 1.24 mg/dL 2.13  0.86  5.78   Sodium 135 - 145 mmol/L 140  139  139   Potassium 3.5 - 5.1 mmol/L 3.4  3.9  3.7   Chloride 98 - 111 mmol/L 107  106  105   CO2 22 - 32 mmol/L 28  29  28    Calcium 8.9 - 10.3 mg/dL 8.8  9.2  9.4   Total Protein 6.5 - 8.1 g/dL 7.2  7.3  7.6   Total Bilirubin 0.3 - 1.2 mg/dL 0.6  0.5  0.5   Alkaline Phos 38 - 126 U/L 139  189  372   AST 15 - 41 U/L 35  30  44   ALT 0 - 44 U/L 67  82  235    Cytology done 07/13/2021 revealed "FINAL MICROSCOPIC DIAGNOSIS:  A. PANCREAS, HEAD, FINE NEEDLE ASPIRATION:  - Malignant cells present  - Poorly differentiated/high-grade neuroendocrine carcinoma (see  comment)   B. CBD STRICTURE, BRUSHING:  - Atypical cells suspicious for tumor   C. BILIARY  DILATION, BALLOON, REMOVAL:  - Atypical cells suspicious for tumor "  Surgical pathology done 07/13/2021 revealed "FINAL MICROSCOPIC DIAGNOSIS:   A. STOMACH, BIOPSY:  Reactive gastropathy and minimal chronic gastritis with lymphoid  aggregate  Negative for H. pylori, intestinal metaplasia, dysplasia and carcinoma"   Pathology 11/07/21: FINAL MICROSCOPIC DIAGNOSIS:   A. GALLBLADDER, CHOLECYSTECTOMY:  - Acute cholecystitis with necrosis and abscess.   RADIOGRAPHIC STUDIES: I have personally reviewed the radiological images as listed and agreed with the findings in the report. CT CHEST ABDOMEN PELVIS W CONTRAST  Result Date: 07/16/2022 CLINICAL DATA:  Metastatic disease evaluation, assess treatment response, poorly differentiated neuroendocrine tumor * Tracking Code: BO * EXAM: CT CHEST, ABDOMEN, AND PELVIS WITH CONTRAST TECHNIQUE: Multidetector CT imaging of the chest, abdomen and pelvis was performed following the standard protocol during bolus administration of intravenous contrast. RADIATION DOSE REDUCTION: This exam was performed according to the departmental dose-optimization program which includes automated exposure control, adjustment of the mA and/or kV according to patient size and/or use of iterative reconstruction technique. CONTRAST:  OMNIPAQUE IOHEXOL 300 MG/ML  SOLN COMPARISON:  04/04/2022 FINDINGS: CT CHEST FINDINGS Cardiovascular: Right chest port catheter. Normal heart size. No pericardial effusion. Mediastinum/Nodes: No enlarged mediastinal, hilar, or axillary lymph nodes. Thyroid gland, trachea, and esophagus demonstrate no significant findings. Lungs/Pleura: Diffuse bilateral bronchial wall thickening. No pleural effusion or pneumothorax. Musculoskeletal: No chest wall abnormality. No acute osseous findings. CT ABDOMEN PELVIS FINDINGS Hepatobiliary: Multiple ill-defined hypodense liver lesions are diminished in size, index lesion in the posterior liver dome, hepatic  segment VII, measuring 1.2 x 1.0 cm, previously 2.8 x 2.2 cm (series 2, image 45), lesion in the superior left lobe of the liver, hepatic segment III measuring 1.4 x 1.5 cm, previously 2.5  x 2.4 cm (series 2, image 49). Status post cholecystectomy. Post stenting pneumobilia without significant biliary ductal dilatation. Pancreas: Common bile duct stent in position with tip in the duodenum. Unchanged appearance of an ill-defined mass of the superior pancreatic head (series 2, image 57). Diffuse atrophy and pancreatic ductal dilatation distal to the pancreatic head, the duct measuring up to 0.6 cm in caliber (series 2, image 60). Spleen: Normal in size without significant abnormality. Adrenals/Urinary Tract: Adrenal glands are unremarkable. Kidneys are normal, without renal calculi, solid lesion, or hydronephrosis. Bladder is unremarkable. Stomach/Bowel: Stomach is within normal limits. Appendix appears normal. No evidence of bowel wall thickening, distention, or inflammatory changes. Vascular/Lymphatic: No significant vascular findings are present. No significant change in enlarged retroperitoneal and portacaval lymph nodes Reproductive: No mass or other abnormality. Other: No abdominal wall hernia or abnormality. Small volume pelvic ascites, increased compared to prior examination (series 2, image 115). Musculoskeletal: No acute osseous findings. IMPRESSION: 1. Multiple ill-defined hypodense liver lesions are diminished in size, consistent with treatment response. 2. Unchanged appearance of an ill-defined mass of the superior pancreatic head. 3. No significant change in enlarged metastatic retroperitoneal and portacaval lymph nodes. 4. Common bile duct stent in position with tip in the duodenum. Post stenting pneumobilia without significant biliary ductal dilatation. 5. Small volume pelvic ascites, increased compared to prior examination. This is nonspecific but presumed malignant, however there is no direct  evidence of peritoneal metastatic disease. 6. Diffuse bilateral bronchial wall thickening, consistent with nonspecific infectious or inflammatory bronchitis. Electronically Signed   By: Jearld Lesch M.D.   On: 07/16/2022 13:29     ASSESSMENT & PLAN:   32 y.o. very pleasant male with  1. 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 -Cytology done 07/13/2021 shows presence of malignant cells in pancreas and poorly differentiated/high grade neuroendocrine carcinoma. - CT Chest Abdomen Pelvis results from 04/04/2022 with the patient. Showed: No evidence of thoracic metastasis. Interval increase in size and prominence multifocal hepatic metastasis. Interval increase in peripancreatic and pericaval retroperitoneal lymphadenopathy. No biliary duct dilatation with stent placed. Interval subtle increase in size of mass in the pancreatic head. Mild pancreatic duct dilatation similar prior.  PLAN:       -Discussed lab results on 07/17/2022 in detail with patient. CBC showed WBC of 3.4 K, hemoglobin of 11.8, and platelets of 111 K.  -Liver enzymes are still abnormal but show signs of improvement. CMP shows AST at 93 U/L and ALT at 215 U/L. -CT chest/abdomen/pelvis scan from 07/12/2022 showed improvement in nodules in the liver, from 2 cm to 1 cm, no other significant new lesions, but increased fluid in the abdomen, which may be due to treatment, and incidental finding of inflammation in the airways. Scan also shows normal pancreas and spleen. Mass in pancreas unchanged.  -Patient will receive Cycle 6 Day 1 of Folfox  -Informed patient of increased risk of neuropathy in cycle 6 of Folfox -Patient notes no new acute toxicities from his FOLFOX chemotherapy treatmnent -discussed proceeding treatment options such as: Stop Oxiloplatin due to abnormal liver enzymes and continue Fluorouracil maintenance treatment Switch  Oxiloplatin to Irenotecan Continue current treatment regimen unless there is any significant side effect concerns -will proceed with only Fluorouracil at this time -will switch Folfox to [facility] -Discussed the option of doing liver biopsy or changing chemotherapy if liver enzymes do not improve  FOLLOW-UP: ***  The total time spent in the appointment  was *** minutes* .  All of the patient's questions were answered with apparent satisfaction. The patient knows to call the clinic with any problems, questions or concerns.   Wyvonnia Lora MD MS AAHIVMS [facility] Hematology/Oncology Physician [facility]  .*Total Encounter Time as defined by the Centers for Medicare and Medicaid Services includes, in addition to the face-to-face time of a patient visit (documented in the note above) non-face-to-face time: obtaining and reviewing outside history, ordering and reviewing medications, tests or procedures, care coordination (communications with other health care professionals or caregivers) and documentation in the medical record.   I,Mitra Faeizi,acting as a Neurosurgeon for Wyvonnia Lora, MD.,have documented all relevant documentation on the behalf of Wyvonnia Lora, MD,as directed by  Wyvonnia Lora, MD while in the presence of Wyvonnia Lora, MD.  ***

## 2022-07-24 ENCOUNTER — Encounter: Payer: Self-pay | Admitting: Hematology

## 2022-07-24 MED ORDER — LIDOCAINE-PRILOCAINE 2.5-2.5 % EX CREA
TOPICAL_CREAM | CUTANEOUS | 3 refills | Status: DC
Start: 2022-07-24 — End: 2023-01-23

## 2022-07-24 MED ORDER — LOPERAMIDE HCL 2 MG PO CAPS
ORAL_CAPSULE | ORAL | 0 refills | Status: DC
Start: 2022-07-24 — End: 2023-01-23

## 2022-07-24 MED ORDER — ONDANSETRON HCL 8 MG PO TABS
8.0000 mg | ORAL_TABLET | Freq: Three times a day (TID) | ORAL | 1 refills | Status: DC | PRN
Start: 2022-07-24 — End: 2023-10-23

## 2022-07-24 MED ORDER — PROCHLORPERAZINE MALEATE 10 MG PO TABS
10.0000 mg | ORAL_TABLET | Freq: Four times a day (QID) | ORAL | 1 refills | Status: DC | PRN
Start: 2022-07-24 — End: 2023-06-20

## 2022-07-24 MED ORDER — DEXAMETHASONE 4 MG PO TABS
8.0000 mg | ORAL_TABLET | Freq: Every day | ORAL | 5 refills | Status: DC
Start: 2022-07-24 — End: 2022-09-25

## 2022-07-25 ENCOUNTER — Other Ambulatory Visit: Payer: Self-pay

## 2022-07-30 MED FILL — Dexamethasone Sodium Phosphate Inj 100 MG/10ML: INTRAMUSCULAR | Qty: 1 | Status: AC

## 2022-07-31 ENCOUNTER — Inpatient Hospital Stay: Payer: 59

## 2022-07-31 ENCOUNTER — Other Ambulatory Visit: Payer: Self-pay

## 2022-07-31 ENCOUNTER — Ambulatory Visit: Payer: 59

## 2022-07-31 ENCOUNTER — Inpatient Hospital Stay (HOSPITAL_BASED_OUTPATIENT_CLINIC_OR_DEPARTMENT_OTHER): Payer: 59 | Admitting: Hematology

## 2022-07-31 ENCOUNTER — Other Ambulatory Visit: Payer: 59

## 2022-07-31 VITALS — BP 132/93 | HR 90 | Temp 98.7°F | Resp 20 | Ht 72.0 in | Wt 280.9 lb

## 2022-07-31 DIAGNOSIS — C7A1 Malignant poorly differentiated neuroendocrine tumors: Secondary | ICD-10-CM | POA: Diagnosis not present

## 2022-07-31 DIAGNOSIS — Z5111 Encounter for antineoplastic chemotherapy: Secondary | ICD-10-CM | POA: Diagnosis not present

## 2022-07-31 DIAGNOSIS — Z7189 Other specified counseling: Secondary | ICD-10-CM

## 2022-07-31 DIAGNOSIS — R7989 Other specified abnormal findings of blood chemistry: Secondary | ICD-10-CM | POA: Diagnosis not present

## 2022-07-31 DIAGNOSIS — Z95828 Presence of other vascular implants and grafts: Secondary | ICD-10-CM

## 2022-07-31 LAB — CBC WITH DIFFERENTIAL (CANCER CENTER ONLY)
Abs Immature Granulocytes: 0.01 10*3/uL (ref 0.00–0.07)
Basophils Absolute: 0 10*3/uL (ref 0.0–0.1)
Basophils Relative: 0 %
Eosinophils Absolute: 0.2 10*3/uL (ref 0.0–0.5)
Eosinophils Relative: 3 %
HCT: 36.5 % — ABNORMAL LOW (ref 39.0–52.0)
Hemoglobin: 12 g/dL — ABNORMAL LOW (ref 13.0–17.0)
Immature Granulocytes: 0 %
Lymphocytes Relative: 14 %
Lymphs Abs: 0.7 10*3/uL (ref 0.7–4.0)
MCH: 30.8 pg (ref 26.0–34.0)
MCHC: 32.9 g/dL (ref 30.0–36.0)
MCV: 93.8 fL (ref 80.0–100.0)
Monocytes Absolute: 0.8 10*3/uL (ref 0.1–1.0)
Monocytes Relative: 15 %
Neutro Abs: 3.5 10*3/uL (ref 1.7–7.7)
Neutrophils Relative %: 68 %
Platelet Count: 176 10*3/uL (ref 150–400)
RBC: 3.89 MIL/uL — ABNORMAL LOW (ref 4.22–5.81)
RDW: 15.4 % (ref 11.5–15.5)
WBC Count: 5.2 10*3/uL (ref 4.0–10.5)
nRBC: 0 % (ref 0.0–0.2)

## 2022-07-31 LAB — CMP (CANCER CENTER ONLY)
ALT: 236 U/L — ABNORMAL HIGH (ref 0–44)
AST: 134 U/L — ABNORMAL HIGH (ref 15–41)
Albumin: 3.8 g/dL (ref 3.5–5.0)
Alkaline Phosphatase: 498 U/L — ABNORMAL HIGH (ref 38–126)
Anion gap: 6 (ref 5–15)
BUN: 12 mg/dL (ref 6–20)
CO2: 27 mmol/L (ref 22–32)
Calcium: 9.7 mg/dL (ref 8.9–10.3)
Chloride: 105 mmol/L (ref 98–111)
Creatinine: 0.62 mg/dL (ref 0.61–1.24)
GFR, Estimated: >60 mL/min (ref >60)
Glucose, Bld: 102 mg/dL — ABNORMAL HIGH (ref 70–99)
Potassium: 3.9 mmol/L (ref 3.5–5.1)
Sodium: 138 mmol/L (ref 135–145)
Total Bilirubin: 0.9 mg/dL (ref 0.3–1.2)
Total Protein: 7.8 g/dL (ref 6.5–8.1)

## 2022-07-31 MED ORDER — SODIUM CHLORIDE 0.9 % IV SOLN
180.0000 mg/m2 | Freq: Once | INTRAVENOUS | Status: AC
  Administered 2022-07-31: 460 mg via INTRAVENOUS
  Filled 2022-07-31: qty 15

## 2022-07-31 MED ORDER — PALONOSETRON HCL INJECTION 0.25 MG/5ML
0.2500 mg | Freq: Once | INTRAVENOUS | Status: AC
  Administered 2022-07-31: 0.25 mg via INTRAVENOUS
  Filled 2022-07-31: qty 5

## 2022-07-31 MED ORDER — SODIUM CHLORIDE 0.9% FLUSH: 10.0000 mL | INTRAVENOUS | Status: DC | PRN

## 2022-07-31 MED ORDER — ATROPINE SULFATE 1 MG/ML IV SOLN
0.5000 mg | Freq: Once | INTRAVENOUS | Status: DC | PRN
  Filled 2022-07-31: qty 1

## 2022-07-31 MED ORDER — SODIUM CHLORIDE 0.9 % IV SOLN
2400.0000 mg/m2 | INTRAVENOUS | Status: DC
  Administered 2022-07-31: 6100 mg via INTRAVENOUS
  Filled 2022-07-31: qty 122

## 2022-07-31 MED ORDER — FLUOROURACIL CHEMO INJECTION 2.5 GM/50ML
400.0000 mg/m2 | Freq: Once | INTRAVENOUS | Status: AC
  Administered 2022-07-31: 1000 mg via INTRAVENOUS
  Filled 2022-07-31: qty 20

## 2022-07-31 MED ORDER — SODIUM CHLORIDE 0.9 % IV SOLN
400.0000 mg/m2 | Freq: Once | INTRAVENOUS | Status: AC
  Administered 2022-07-31: 1020 mg via INTRAVENOUS
  Filled 2022-07-31: qty 51

## 2022-07-31 MED ORDER — SODIUM CHLORIDE 0.9 % IV SOLN
10.0000 mg | Freq: Once | INTRAVENOUS | Status: AC
  Administered 2022-07-31: 10 mg via INTRAVENOUS
  Filled 2022-07-31: qty 10

## 2022-07-31 MED ORDER — SODIUM CHLORIDE 0.9 % IV SOLN: Freq: Once | INTRAVENOUS | Status: AC

## 2022-07-31 MED ORDER — SODIUM CHLORIDE 0.9% FLUSH
10.0000 mL | Freq: Once | INTRAVENOUS | Status: AC
  Administered 2022-07-31: 10 mL

## 2022-07-31 NOTE — Progress Notes (Signed)
Pt okay for tx with AST: 134, ALT: 236 per Dr Candise Che

## 2022-07-31 NOTE — Patient Instructions (Signed)
[facility]  Discharge Instructions: Thank you for choosing [facility] to provide your oncology and hematology care.   If you have a lab appointment with the Cancer Center, please go directly to the Cancer Center and check in at the registration area.   Wear comfortable clothing and clothing appropriate for easy access to any Portacath or PICC line.   We strive to give you quality time with your provider. You may need to reschedule your appointment if you arrive late (15 or more minutes).  Arriving late affects you and other patients whose appointments are after yours.  Also, if you miss three or more appointments without notifying the office, you may be dismissed from the clinic at the provider's discretion.      For prescription refill requests, have your pharmacy contact our office and allow 72 hours for refills to be completed.    Today you received the following chemotherapy and/or immunotherapy agents: Irinotecan, Leucovorin, Fluorouracil.       To help prevent nausea and vomiting after your treatment, we encourage you to take your nausea medication as directed.  BELOW ARE SYMPTOMS THAT SHOULD BE REPORTED IMMEDIATELY: *FEVER GREATER THAN 100.4 F (38 C) OR HIGHER *CHILLS OR SWEATING *NAUSEA AND VOMITING THAT IS NOT CONTROLLED WITH YOUR NAUSEA MEDICATION *UNUSUAL SHORTNESS OF BREATH *UNUSUAL BRUISING OR BLEEDING *URINARY PROBLEMS (pain or burning when urinating, or frequent urination) *BOWEL PROBLEMS (unusual diarrhea, constipation, pain near the anus) TENDERNESS IN MOUTH AND THROAT WITH OR WITHOUT PRESENCE OF ULCERS (sore throat, sores in mouth, or a toothache) UNUSUAL RASH, SWELLING OR PAIN  UNUSUAL VAGINAL DISCHARGE OR ITCHING   Items with * indicate a potential emergency and should be followed up as soon as possible or go to the Emergency Department if any problems should occur.  Please show the CHEMOTHERAPY ALERT CARD or  IMMUNOTHERAPY ALERT CARD at check-in to the Emergency Department and triage nurse.  Should you have questions after your visit or need to cancel or reschedule your appointment, please contact [facility]  Dept: [phone]  and follow the prompts.  Office hours are 8:00 a.m. to 4:30 p.m. Monday - Friday. Please note that voicemails left after 4:00 p.m. may not be returned until the following business day.  We are closed weekends and major holidays. You have access to a nurse at all times for urgent questions. Please call the main number to the clinic Dept: [phone] and follow the prompts.   For any non-urgent questions, you may also contact your provider using MyChart. We now offer e-Visits for anyone 18 and older to request care online for non-urgent symptoms. For details visit [email or web address].   Also download the MyChart app! Go to the app store, search "MyChart", open the app, select [place], and log in with your MyChart username and password.   

## 2022-07-31 NOTE — Progress Notes (Signed)
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  HISTORY OF PRESENTING ILLNESS:  Leon Laible. is a wonderful 32 y.o. 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 He presents today accompanied by his mother. He reports He is doing well.  He reports no color change in stool.  He notes that he is eating well but feels food just sits in stomach.  We discussed getting additional scans and labs for further evaluation and treatment which he was agreeable to. We further discussed getting MRI of brain and PET scan which he was agreeable to.  He notes that he is trying to hold it together mentally and is still trying to process his new diagnosis. We discussed the options of psychological or spiritual counseling which he has not decided on at this time. He was advised that may call back if he decides that he would like to pursue either.  We discussed getting a port a cath installed which he was agreeable to.  He recently had a biliary stent put into common bile duct.   No abdominal pain or change in bowel habits. No new or unexpected weight loss. No other new or acute focal symptoms.  We discussed his recent stomach biopsy done 07/13/2021 which sowed gastropathy and minor chronic gastritis with lymphoid aggregate. Negative for H. Pylori.  We discussed recent cytology done 07/13/2021 which showed presence of malignant cells in pancreas and poorly differentiated/high grade neuroendocrine carcinoma.  We discussed upper endoscopy done 07/13/2021 revealed mass in pancreatic head and  some enlarged lymph nodes were seen in the celiac and peripancreatic regions.  Labs done today were reviewed in detail.  INTERVAL HISTORY:  Leon Liv. Is a 32 y.o. 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  Patient was last seen by me on 07/17/2022 and reported tingling when touching cold objects, but was otherwise doing well overall.   Today, he is scheduled to receive day 1 cycle 1 of his FOLFIRI treatment. He is accompanied by his mother. He denies any infection issues, abdominal tenderness or pain, change in bowel habits, new tingling/numbness, back pain, leg swelling, change in breathing habits or other new symptoms. Patient has normal p.o. intake.   MEDICAL HISTORY:  Past Medical History:  Diagnosis Date   Asthma    Neuroendocrine cancer (HCC)     SURGICAL HISTORY: Past Surgical History:  Procedure Laterality Date   BILIARY DILATION  07/13/2021   Procedure: BILIARY DILATION;  Surgeon: Vida Rigger, MD;  Location: [facility];  Service: Gastroenterology;;   BILIARY STENT PLACEMENT N/A 07/13/2021   Procedure: BILIARY STENT PLACEMENT;  Surgeon: Vida Rigger, MD;  Location: [name];  Service: Gastroenterology;  Laterality: N/A;   BILIARY STENT PLACEMENT N/A 11/05/2021   Procedure: BILIARY STENT PLACEMENT;  Surgeon: Vida Rigger, MD;  Location: [name];  Service: Gastroenterology;  Laterality: N/A;   BIOPSY  07/13/2021   Procedure: BIOPSY;  Surgeon: Meridee Score Netty Starring., MD;  Location: [facility];  Service: Gastroenterology;;   CHOLECYSTECTOMY N/A 11/07/2021   Procedure: LAPAROSCOPIC CHOLECYSTECTOMY;  Surgeon: Berna Bue, MD;  Location: [facility];  Service: General;  Laterality: N/A;  ENDOSCOPIC RETROGRADE CHOLANGIOPANCREATOGRAPHY (ERCP) WITH PROPOFOL N/A 07/13/2021   Procedure: ENDOSCOPIC RETROGRADE CHOLANGIOPANCREATOGRAPHY (ERCP) WITH PROPOFOL;  Surgeon: Vida Rigger, MD;  Location: [facility];  Service: Gastroenterology;  Laterality: N/A;   ERCP N/A 11/05/2021   Procedure: ENDOSCOPIC RETROGRADE CHOLANGIOPANCREATOGRAPHY (ERCP);  Surgeon: Vida Rigger, MD;  Location: [facility];  Service: Gastroenterology;  Laterality: N/A;   ESOPHAGOGASTRODUODENOSCOPY (EGD) WITH PROPOFOL N/A 07/13/2021   Procedure: ESOPHAGOGASTRODUODENOSCOPY (EGD) WITH PROPOFOL;  Surgeon: Meridee Score Netty Starring., MD;  Location: [name];  Service: Gastroenterology;  Laterality: N/A;   EUS N/A 07/13/2021   Procedure: UPPER ENDOSCOPIC ULTRASOUND (EUS) LINEAR;  Surgeon: Lemar Lofty., MD;  Location: [name];  Service: Gastroenterology;  Laterality: N/A;   FINE NEEDLE ASPIRATION  07/13/2021   Procedure: FINE NEEDLE ASPIRATION (FNA) LINEAR;  Surgeon: Lemar Lofty., MD;  Location: [facility];  Service: Gastroenterology;;   IR IMAGING GUIDED PORT INSERTION  07/31/2021   SPHINCTEROTOMY  07/13/2021   Procedure: Dennison Mascot;  Surgeon: Vida Rigger, MD;  Location: [name];  Service: Gastroenterology;;    SOCIAL HISTORY: Social History   Socioeconomic History   Marital status: Single    Spouse name: Not on file   Number of children: 0   Years of education: Not on file   Highest education level: Some college, no degree  Occupational History   Not on file  Tobacco Use   Smoking status: Some Days    Types: Cigarettes   Smokeless tobacco: Never  Vaping Use   Vaping Use: Never used  Substance and Sexual Activity   Alcohol use: Yes    Comment: social   Drug use: No   Sexual activity: Never  Other Topics Concern   Not on file  Social History Narrative   Not on file   Social Determinants of Health   Financial Resource Strain: Low Risk  (04/14/2017)   Overall Financial Resource Strain (CARDIA)    Difficulty of Paying Living Expenses: Not hard at all  Food Insecurity: No Food Insecurity (11/07/2021)   Hunger Vital Sign    Worried About Running Out of Food in the Last Year:  Never true    Ran Out of Food in the Last Year: Never true  Transportation Needs: No Transportation Needs (11/07/2021)   PRAPARE - Administrator, Civil Service (Medical): No    Lack of Transportation (Non-Medical): No  Physical Activity: Inactive (04/14/2017)   Exercise Vital Sign    Days of Exercise per Week: 0 days    Minutes of Exercise per Session: 0 min  Stress: Stress Concern Present (04/14/2017)   [name] of Occupational Health - Occupational Stress Questionnaire    Feeling of Stress : Rather much  Social Connections: Moderately Isolated (04/14/2017)   Social Connection and Isolation Panel [NHANES]    Frequency of Communication with Friends and Family: More than three times a week    Frequency of Social Gatherings with Friends and Family: More than three times a week    Attends Religious Services: Never    Database administrator or Organizations: No    Attends Banker Meetings: Never    Marital Status: Never married  Intimate Partner Violence: Not At Risk (11/07/2021)   Humiliation, Afraid, Rape, and Kick questionnaire    Fear of Current or Ex-Partner: No    Emotionally Abused: No    Physically Abused: No    Sexually Abused: No    FAMILY HISTORY: Family History  Problem Relation Age of Onset  Drug abuse Father    ALLERGIES:  is allergic to shellfish allergy and other.  MEDICATIONS:  Current Outpatient Medications  Medication Sig Dispense Refill   acetaminophen (TYLENOL) 500 MG tablet Take 1,000 mg by mouth every 6 (six) hours as needed for mild pain.     albuterol (VENTOLIN HFA) 108 (90 Base) MCG/ACT inhaler INHALE 2 PUFFS BY MOUTH EVERY 4 HOURS AS NEEDED FOR WHEEZING FOR SHORTNESS OF BREATH 18 g 0   dexamethasone (DECADRON) 4 MG tablet Take 2 tablets (8 mg total) by mouth daily. Start the day after chemotherapy for 2 days. Take with food. 8 tablet 5   diclofenac Sodium (VOLTAREN) 1 % GEL Apply 4 g topically 4 (four) times daily.  (Patient taking differently: Apply 4 g topically 4 (four) times daily as needed (pain).) 100 g 0   lidocaine-prilocaine (EMLA) cream Apply to affected area once 30 g 3   loperamide (IMODIUM) 2 MG capsule Take 2 tabs by mouth with first loose stool, then 1 tab with each additional loose stool as needed. Do not exceed 8 tabs in a 24-hour period 30 capsule 0   methocarbamol (ROBAXIN) 500 MG tablet Take 1 tablet (500 mg total) by mouth 2 (two) times daily. 20 tablet 0   ondansetron (ZOFRAN) 8 MG tablet Take 1 tablet (8 mg total) by mouth every 8 (eight) hours as needed for nausea, vomiting or refractory nausea / vomiting. Start on the third day after chemotherapy. 30 tablet 1   oxyCODONE (OXY IR/ROXICODONE) 5 MG immediate release tablet Take 1 tablet (5 mg total) by mouth every 6 (six) hours as needed for severe pain or moderate pain. 20 tablet 0   polyethylene glycol (MIRALAX / GLYCOLAX) 17 g packet Take 17 g by mouth daily. (Patient taking differently: Take 17 g by mouth daily as needed for mild constipation or moderate constipation.) 14 each 0   prochlorperazine (COMPAZINE) 10 MG tablet Take 1 tablet (10 mg total) by mouth every 6 (six) hours as needed for nausea or vomiting. 30 tablet 1   senna-docusate (SENOKOT-S) 8.6-50 MG tablet Take 2 tablets by mouth 2 (two) times daily. (Patient taking differently: Take 2 tablets by mouth daily as needed for mild constipation or moderate constipation.) 30 tablet 0   No current facility-administered medications for this visit.    REVIEW OF SYSTEMS:    10 Point review of Systems was done is negative except as noted above.   PHYSICAL EXAMINATION: ECOG PERFORMANCE STATUS: 1 - Symptomatic but completely ambulatory VSS GENERAL:alert, in no acute distress and comfortable SKIN: no acute rashes, no significant lesions EYES: conjunctiva are pink and non-injected, sclera anicteric OROPHARYNX: MMM, no exudates, no oropharyngeal erythema or ulceration NECK: supple,  no JVD LYMPH:  no palpable lymphadenopathy in the cervical, axillary or inguinal regions LUNGS: clear to auscultation b/l with normal respiratory effort HEART: regular rate & rhythm ABDOMEN:  normoactive bowel sounds , non tender, not distended. Extremity: no pedal edema PSYCH: alert & oriented x 3 with fluent speech NEURO: no focal motor/sensory deficits   LABORATORY DATA:  I have reviewed the data as listed    Latest Ref Rng & Units 07/31/2022    8:54 AM 07/17/2022    9:09 AM 07/03/2022    9:10 AM  CBC  WBC 4.0 - 10.5 K/uL 5.2  3.4  4.2   Hemoglobin 13.0 - 17.0 g/dL 40.9  81.1  91.4   Hematocrit 39.0 - 52.0 % 36.5  35.0  33.2   Platelets  150 - 400 K/uL 176  111  109       Latest Ref Rng & Units 07/31/2022    8:54 AM 07/17/2022    9:09 AM 07/03/2022    9:10 AM  CMP  Glucose 70 - 99 mg/dL 409  811  914   BUN 6 - 20 mg/dL 12  9  9    Creatinine 0.61 - 1.24 mg/dL 7.82  9.56  2.13   Sodium 135 - 145 mmol/L 138  141  140   Potassium 3.5 - 5.1 mmol/L 3.9  3.4  3.4   Chloride 98 - 111 mmol/L 105  107  107   CO2 22 - 32 mmol/L 27  27  28    Calcium 8.9 - 10.3 mg/dL 9.7  9.5  8.8   Total Protein 6.5 - 8.1 g/dL 7.8  7.4  7.2   Total Bilirubin 0.3 - 1.2 mg/dL 0.9  0.9  0.6   Alkaline Phos 38 - 126 U/L 498  376  139   AST 15 - 41 U/L 134  93  35   ALT 0 - 44 U/L 236  215  67    Cytology done 07/13/2021 revealed "FINAL MICROSCOPIC DIAGNOSIS:  A. PANCREAS, HEAD, FINE NEEDLE ASPIRATION:  - Malignant cells present  - Poorly differentiated/high-grade neuroendocrine carcinoma (see  comment)   B. CBD STRICTURE, BRUSHING:  - Atypical cells suspicious for tumor   C. BILIARY DILATION, BALLOON, REMOVAL:  - Atypical cells suspicious for tumor "  Surgical pathology done 07/13/2021 revealed "FINAL MICROSCOPIC DIAGNOSIS:   A. STOMACH, BIOPSY:  Reactive gastropathy and minimal chronic gastritis with lymphoid  aggregate  Negative for H. pylori, intestinal metaplasia, dysplasia and  carcinoma"   Pathology 11/07/21: FINAL MICROSCOPIC DIAGNOSIS:   A. GALLBLADDER, CHOLECYSTECTOMY:  - Acute cholecystitis with necrosis and abscess.   RADIOGRAPHIC STUDIES: I have personally reviewed the radiological images as listed and agreed with the findings in the report. CT CHEST ABDOMEN PELVIS W CONTRAST  Result Date: 07/16/2022 CLINICAL DATA:  Metastatic disease evaluation, assess treatment response, poorly differentiated neuroendocrine tumor * Tracking Code: BO * EXAM: CT CHEST, ABDOMEN, AND PELVIS WITH CONTRAST TECHNIQUE: Multidetector CT imaging of the chest, abdomen and pelvis was performed following the standard protocol during bolus administration of intravenous contrast. RADIATION DOSE REDUCTION: This exam was performed according to the departmental dose-optimization program which includes automated exposure control, adjustment of the mA and/or kV according to patient size and/or use of iterative reconstruction technique. CONTRAST:  OMNIPAQUE IOHEXOL 300 MG/ML  SOLN COMPARISON:  04/04/2022 FINDINGS: CT CHEST FINDINGS Cardiovascular: Right chest port catheter. Normal heart size. No pericardial effusion. Mediastinum/Nodes: No enlarged mediastinal, hilar, or axillary lymph nodes. Thyroid gland, trachea, and esophagus demonstrate no significant findings. Lungs/Pleura: Diffuse bilateral bronchial wall thickening. No pleural effusion or pneumothorax. Musculoskeletal: No chest wall abnormality. No acute osseous findings. CT ABDOMEN PELVIS FINDINGS Hepatobiliary: Multiple ill-defined hypodense liver lesions are diminished in size, index lesion in the posterior liver dome, hepatic segment VII, measuring 1.2 x 1.0 cm, previously 2.8 x 2.2 cm (series 2, image 45), lesion in the superior left lobe of the liver, hepatic segment III measuring 1.4 x 1.5 cm, previously 2.5 x 2.4 cm (series 2, image 49). Status post cholecystectomy. Post stenting pneumobilia without significant biliary ductal  dilatation. Pancreas: Common bile duct stent in position with tip in the duodenum. Unchanged appearance of an ill-defined mass of the superior pancreatic head (series 2, image 57). Diffuse atrophy and pancreatic ductal dilatation  distal to the pancreatic head, the duct measuring up to 0.6 cm in caliber (series 2, image 60). Spleen: Normal in size without significant abnormality. Adrenals/Urinary Tract: Adrenal glands are unremarkable. Kidneys are normal, without renal calculi, solid lesion, or hydronephrosis. Bladder is unremarkable. Stomach/Bowel: Stomach is within normal limits. Appendix appears normal. No evidence of bowel wall thickening, distention, or inflammatory changes. Vascular/Lymphatic: No significant vascular findings are present. No significant change in enlarged retroperitoneal and portacaval lymph nodes Reproductive: No mass or other abnormality. Other: No abdominal wall hernia or abnormality. Small volume pelvic ascites, increased compared to prior examination (series 2, image 115). Musculoskeletal: No acute osseous findings. IMPRESSION: 1. Multiple ill-defined hypodense liver lesions are diminished in size, consistent with treatment response. 2. Unchanged appearance of an ill-defined mass of the superior pancreatic head. 3. No significant change in enlarged metastatic retroperitoneal and portacaval lymph nodes. 4. Common bile duct stent in position with tip in the duodenum. Post stenting pneumobilia without significant biliary ductal dilatation. 5. Small volume pelvic ascites, increased compared to prior examination. This is nonspecific but presumed malignant, however there is no direct evidence of peritoneal metastatic disease. 6. Diffuse bilateral bronchial wall thickening, consistent with nonspecific infectious or inflammatory bronchitis. Electronically Signed   By: Jearld Lesch M.D.   On: 07/16/2022 13:29     ASSESSMENT & PLAN:   32 y.o. very pleasant male with  1. Poorly  differentiated/high grade neuroendocrine carcinoma - likely Stage IV metastatic to loco regional LNs and likely liver mets. -Cytology done 07/13/2021 shows presence of malignant cells in pancreas and poorly differentiated/high grade neuroendocrine carcinoma. - CT Chest Abdomen Pelvis results from 04/04/2022 with the patient. Showed: No evidence of thoracic metastasis. Interval increase in size and prominence multifocal hepatic metastasis. Interval increase in peripancreatic and pericaval retroperitoneal lymphadenopathy. No biliary duct dilatation with stent placed. Interval subtle increase in size of mass in the pancreatic head. Mild pancreatic duct dilatation similar prior.  PLAN:  -Discussed lab results on 07/31/2022 in detail with patient. CBC normal, showed WBC of 5.2K, hemoglobin of 12.0, and platelets of 176K. -CMP noted to have elevated LFTs likely from liver mets -Proceed with Day 1, cycle 1 FOLFIRI treatment -informed patient of possible side effects from irinotecan such as diarrhea. -recommend having imodium available in case patient experiences diarrhea -advised patient to inform us of any symptoms such as diarrhea as soon as possible  FOLLOW-UP: -Please schedule per integrated scheduling.  Every 2-week FOLFIRI with port flush labs and MD visit  The total time spent in the appointment was 30 minutes* .  All of the patient's questions were answered with apparent satisfaction. The patient knows to call the clinic with any problems, questions or concerns.   Wyvonnia Lora MD MS AAHIVMS [facility] Hematology/Oncology Physician [facility]  .*Total Encounter Time as defined by the Centers for Medicare and Medicaid Services includes, in addition to the face-to-face time of a patient visit (documented in the note above) non-face-to-face time: obtaining and reviewing outside history, ordering and reviewing medications, tests or procedures, care coordination (communications with  other health care professionals or caregivers) and documentation in the medical record.    I,Leon Fischer,acting as a Neurosurgeon for Wyvonnia Lora, MD.,have documented all relevant documentation on the behalf of Wyvonnia Lora, MD,as directed by  Wyvonnia Lora, MD while in the presence of Wyvonnia Lora, MD.  .I have reviewed the above documentation for accuracy and completeness, and I agree with the above. [name] MD

## 2022-08-02 ENCOUNTER — Other Ambulatory Visit: Payer: Self-pay

## 2022-08-02 ENCOUNTER — Inpatient Hospital Stay: Payer: 59

## 2022-08-02 DIAGNOSIS — C7A1 Malignant poorly differentiated neuroendocrine tumors: Secondary | ICD-10-CM

## 2022-08-02 DIAGNOSIS — Z7189 Other specified counseling: Secondary | ICD-10-CM

## 2022-08-02 DIAGNOSIS — Z5111 Encounter for antineoplastic chemotherapy: Secondary | ICD-10-CM | POA: Diagnosis not present

## 2022-08-02 MED ORDER — HEPARIN SOD (PORK) LOCK FLUSH 100 UNIT/ML IV SOLN
500.0000 [IU] | Freq: Once | INTRAVENOUS | Status: AC | PRN
  Administered 2022-08-02: 500 [IU]

## 2022-08-02 MED ORDER — SODIUM CHLORIDE 0.9% FLUSH
10.0000 mL | INTRAVENOUS | Status: DC | PRN
  Administered 2022-08-02: 10 mL

## 2022-08-06 ENCOUNTER — Encounter: Payer: Self-pay | Admitting: Hematology

## 2022-08-12 MED FILL — Dexamethasone Sodium Phosphate Inj 100 MG/10ML: INTRAMUSCULAR | Qty: 1 | Status: AC

## 2022-08-13 ENCOUNTER — Inpatient Hospital Stay: Payer: 59

## 2022-08-13 ENCOUNTER — Inpatient Hospital Stay (HOSPITAL_BASED_OUTPATIENT_CLINIC_OR_DEPARTMENT_OTHER): Payer: 59 | Admitting: Hematology

## 2022-08-13 ENCOUNTER — Inpatient Hospital Stay: Payer: 59 | Attending: Hematology

## 2022-08-13 ENCOUNTER — Other Ambulatory Visit: Payer: Self-pay

## 2022-08-13 VITALS — BP 124/84 | HR 87 | Temp 97.5°F | Resp 20 | Wt 285.3 lb

## 2022-08-13 DIAGNOSIS — Z7189 Other specified counseling: Secondary | ICD-10-CM

## 2022-08-13 DIAGNOSIS — C7A1 Malignant poorly differentiated neuroendocrine tumors: Secondary | ICD-10-CM | POA: Insufficient documentation

## 2022-08-13 DIAGNOSIS — Z95828 Presence of other vascular implants and grafts: Secondary | ICD-10-CM

## 2022-08-13 DIAGNOSIS — F17210 Nicotine dependence, cigarettes, uncomplicated: Secondary | ICD-10-CM | POA: Diagnosis not present

## 2022-08-13 DIAGNOSIS — Z5111 Encounter for antineoplastic chemotherapy: Secondary | ICD-10-CM | POA: Diagnosis not present

## 2022-08-13 LAB — CBC WITH DIFFERENTIAL (CANCER CENTER ONLY)
Abs Immature Granulocytes: 0.01 10*3/uL (ref 0.00–0.07)
Basophils Absolute: 0 10*3/uL (ref 0.0–0.1)
Basophils Relative: 0 %
Eosinophils Absolute: 0.2 10*3/uL (ref 0.0–0.5)
Eosinophils Relative: 4 %
HCT: 33.9 % — ABNORMAL LOW (ref 39.0–52.0)
Hemoglobin: 11.2 g/dL — ABNORMAL LOW (ref 13.0–17.0)
Immature Granulocytes: 0 %
Lymphocytes Relative: 20 %
Lymphs Abs: 0.9 10*3/uL (ref 0.7–4.0)
MCH: 30.9 pg (ref 26.0–34.0)
MCHC: 33 g/dL (ref 30.0–36.0)
MCV: 93.4 fL (ref 80.0–100.0)
Monocytes Absolute: 0.5 10*3/uL (ref 0.1–1.0)
Monocytes Relative: 10 %
Neutro Abs: 3.1 10*3/uL (ref 1.7–7.7)
Neutrophils Relative %: 66 %
Platelet Count: 196 10*3/uL (ref 150–400)
RBC: 3.63 MIL/uL — ABNORMAL LOW (ref 4.22–5.81)
RDW: 14.6 % (ref 11.5–15.5)
WBC Count: 4.7 10*3/uL (ref 4.0–10.5)
nRBC: 0 % (ref 0.0–0.2)

## 2022-08-13 LAB — CMP (CANCER CENTER ONLY)
ALT: 132 U/L — ABNORMAL HIGH (ref 0–44)
AST: 49 U/L — ABNORMAL HIGH (ref 15–41)
Albumin: 3.6 g/dL (ref 3.5–5.0)
Alkaline Phosphatase: 374 U/L — ABNORMAL HIGH (ref 38–126)
Anion gap: 6 (ref 5–15)
BUN: 11 mg/dL (ref 6–20)
CO2: 26 mmol/L (ref 22–32)
Calcium: 9.4 mg/dL (ref 8.9–10.3)
Chloride: 108 mmol/L (ref 98–111)
Creatinine: 0.7 mg/dL (ref 0.61–1.24)
GFR, Estimated: >60 mL/min (ref >60)
Glucose, Bld: 99 mg/dL (ref 70–99)
Potassium: 3.7 mmol/L (ref 3.5–5.1)
Sodium: 140 mmol/L (ref 135–145)
Total Bilirubin: 0.5 mg/dL (ref 0.3–1.2)
Total Protein: 7.3 g/dL (ref 6.5–8.1)

## 2022-08-13 MED ORDER — PALONOSETRON HCL INJECTION 0.25 MG/5ML
0.2500 mg | Freq: Once | INTRAVENOUS | Status: AC
  Administered 2022-08-13: 0.25 mg via INTRAVENOUS
  Filled 2022-08-13: qty 5

## 2022-08-13 MED ORDER — SODIUM CHLORIDE 0.9% FLUSH
10.0000 mL | Freq: Once | INTRAVENOUS | Status: AC
  Administered 2022-08-13: 10 mL

## 2022-08-13 MED ORDER — HEPARIN SOD (PORK) LOCK FLUSH 100 UNIT/ML IV SOLN: 500.0000 [IU] | Freq: Once | INTRAVENOUS | Status: DC | PRN

## 2022-08-13 MED ORDER — FLUOROURACIL CHEMO INJECTION 2.5 GM/50ML
400.0000 mg/m2 | Freq: Once | INTRAVENOUS | Status: AC
  Administered 2022-08-13: 1000 mg via INTRAVENOUS
  Filled 2022-08-13: qty 20

## 2022-08-13 MED ORDER — SODIUM CHLORIDE 0.9 % IV SOLN: Freq: Once | INTRAVENOUS | Status: AC

## 2022-08-13 MED ORDER — SODIUM CHLORIDE 0.9 % IV SOLN
180.0000 mg/m2 | Freq: Once | INTRAVENOUS | Status: AC
  Administered 2022-08-13: 460 mg via INTRAVENOUS
  Filled 2022-08-13: qty 15

## 2022-08-13 MED ORDER — SODIUM CHLORIDE 0.9 % IV SOLN
400.0000 mg/m2 | Freq: Once | INTRAVENOUS | Status: AC
  Administered 2022-08-13: 1020 mg via INTRAVENOUS
  Filled 2022-08-13: qty 50

## 2022-08-13 MED ORDER — SODIUM CHLORIDE 0.9 % IV SOLN
10.0000 mg | Freq: Once | INTRAVENOUS | Status: AC
  Administered 2022-08-13: 10 mg via INTRAVENOUS
  Filled 2022-08-13: qty 10

## 2022-08-13 MED ORDER — SODIUM CHLORIDE 0.9 % IV SOLN
2400.0000 mg/m2 | INTRAVENOUS | Status: DC
  Administered 2022-08-13: 6100 mg via INTRAVENOUS
  Filled 2022-08-13: qty 122

## 2022-08-13 MED ORDER — SODIUM CHLORIDE 0.9% FLUSH: 10.0000 mL | INTRAVENOUS | Status: DC | PRN

## 2022-08-13 NOTE — Patient Instructions (Signed)
[facility]  Discharge Instructions: Thank you for choosing [facility] to provide your oncology and hematology care.   If you have a lab appointment with the Cancer Center, please go directly to the Cancer Center and check in at the registration area.   Wear comfortable clothing and clothing appropriate for easy access to any Portacath or PICC line.   We strive to give you quality time with your provider. You may need to reschedule your appointment if you arrive late (15 or more minutes).  Arriving late affects you and other patients whose appointments are after yours.  Also, if you miss three or more appointments without notifying the office, you may be dismissed from the clinic at the provider's discretion.      For prescription refill requests, have your pharmacy contact our office and allow 72 hours for refills to be completed.    Today you received the following chemotherapy and/or immunotherapy agents: Irinotecan, Leucovorin, Fluorouracil.       To help prevent nausea and vomiting after your treatment, we encourage you to take your nausea medication as directed.  BELOW ARE SYMPTOMS THAT SHOULD BE REPORTED IMMEDIATELY: *FEVER GREATER THAN 100.4 F (38 C) OR HIGHER *CHILLS OR SWEATING *NAUSEA AND VOMITING THAT IS NOT CONTROLLED WITH YOUR NAUSEA MEDICATION *UNUSUAL SHORTNESS OF BREATH *UNUSUAL BRUISING OR BLEEDING *URINARY PROBLEMS (pain or burning when urinating, or frequent urination) *BOWEL PROBLEMS (unusual diarrhea, constipation, pain near the anus) TENDERNESS IN MOUTH AND THROAT WITH OR WITHOUT PRESENCE OF ULCERS (sore throat, sores in mouth, or a toothache) UNUSUAL RASH, SWELLING OR PAIN  UNUSUAL VAGINAL DISCHARGE OR ITCHING   Items with * indicate a potential emergency and should be followed up as soon as possible or go to the Emergency Department if any problems should occur.  Please show the CHEMOTHERAPY ALERT CARD or  IMMUNOTHERAPY ALERT CARD at check-in to the Emergency Department and triage nurse.  Should you have questions after your visit or need to cancel or reschedule your appointment, please contact [facility]  Dept: [phone]  and follow the prompts.  Office hours are 8:00 a.m. to 4:30 p.m. Monday - Friday. Please note that voicemails left after 4:00 p.m. may not be returned until the following business day.  We are closed weekends and major holidays. You have access to a nurse at all times for urgent questions. Please call the main number to the clinic Dept: [phone] and follow the prompts.   For any non-urgent questions, you may also contact your provider using MyChart. We now offer e-Visits for anyone 18 and older to request care online for non-urgent symptoms. For details visit [email or web address].   Also download the MyChart app! Go to the app store, search "MyChart", open the app, select , and log in with your MyChart username and password.   

## 2022-08-13 NOTE — Progress Notes (Signed)
Patient seen by Dr. Addison Naegeli are within treatment parameters.  Labs reviewed: and are not all within treatment parameters. Dr Candise Che aware AST: 49, ALT: 132, Al Phos 374  Per physician team, patient is ready for treatment and there are NO modifications to the treatment plan.

## 2022-08-13 NOTE — Progress Notes (Signed)
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  HISTORY OF PRESENTING ILLNESS:  Leon Buys. is a wonderful 32 y.o. 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 He presents today accompanied by his mother. He reports He is doing well.  He reports no color change in stool.  He notes that he is eating well but feels food just sits in stomach.  We discussed getting additional scans and labs for further evaluation and treatment which he was agreeable to. We further discussed getting MRI of brain and PET scan which he was agreeable to.  He notes that he is trying to hold it together mentally and is still trying to process his new diagnosis. We discussed the options of psychological or spiritual counseling which he has not decided on at this time. He was advised that may call back if he decides that he would like to pursue either.  We discussed getting a port a cath installed which he was agreeable to.  He recently had a biliary stent put into common bile duct.   No abdominal pain or change in bowel habits. No new or unexpected weight loss. No other new or acute focal symptoms.  We discussed his recent stomach biopsy done 07/13/2021 which sowed gastropathy and minor chronic gastritis with lymphoid aggregate. Negative for H. Pylori.  We discussed recent cytology done 07/13/2021 which showed presence of malignant cells in pancreas and poorly differentiated/high grade neuroendocrine carcinoma.  We discussed upper endoscopy done 07/13/2021 revealed mass in pancreatic head and  some enlarged lymph nodes were seen in the celiac and peripancreatic regions.  Labs done today were reviewed in detail.  INTERVAL HISTORY:  Leon Liv. Is a 32 y.o. 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 He is here for cycle 2 day 1 of folifiri.   Patient was last seen by me on 07/31/2022 and he was doing well overall.   Patient is accompanied ny his mother during this visit. He notes he has been doing well overall without any new or severe medical concerns since our last visit. He notes he tolerated his cycle 1 of Folfiri well without any new or severe toxicities.   He denies any new infection issues, fever, chills, night sweats, diarrhea, skin rashes, appetite loss, nausea, neuropathy, back pain, chest pain, abdominal pain, or leg swelling.   He notes he has been eating well and has been staying well hydrated.    MEDICAL HISTORY:  Past Medical History:  Diagnosis Date   Asthma    Neuroendocrine cancer (HCC)     SURGICAL HISTORY: Past Surgical History:  Procedure Laterality Date   BILIARY DILATION  07/13/2021   Procedure: BILIARY DILATION;  Surgeon: Vida Rigger, MD;  Location: [facility];  Service: Gastroenterology;;   BILIARY STENT PLACEMENT N/A 07/13/2021   Procedure: BILIARY STENT PLACEMENT;  Surgeon: Vida Rigger, MD;  Location: [name];  Service: Gastroenterology;  Laterality: N/A;   BILIARY STENT PLACEMENT N/A 11/05/2021   Procedure: BILIARY STENT PLACEMENT;  Surgeon: Vida Rigger, MD;  Location: [name];  Service: Gastroenterology;  Laterality: N/A;   BIOPSY  07/13/2021   Procedure: BIOPSY;  Surgeon: Lemar Lofty., MD;  Location: [facility];  Service: Gastroenterology;;   CHOLECYSTECTOMY N/A 11/07/2021   Procedure: LAPAROSCOPIC CHOLECYSTECTOMY;  Surgeon: Berna Bue, MD;  Location: [facility];  Service: General;  Laterality: N/A;   ENDOSCOPIC RETROGRADE  CHOLANGIOPANCREATOGRAPHY (ERCP) WITH PROPOFOL N/A 07/13/2021   Procedure: ENDOSCOPIC RETROGRADE CHOLANGIOPANCREATOGRAPHY (ERCP) WITH PROPOFOL;  Surgeon: Vida Rigger, MD;  Location: [name];  Service: Gastroenterology;  Laterality: N/A;   ERCP N/A 11/05/2021   Procedure: ENDOSCOPIC RETROGRADE CHOLANGIOPANCREATOGRAPHY (ERCP);  Surgeon: Vida Rigger, MD;  Location: [facility];  Service: Gastroenterology;  Laterality: N/A;   ESOPHAGOGASTRODUODENOSCOPY (EGD) WITH PROPOFOL N/A 07/13/2021   Procedure: ESOPHAGOGASTRODUODENOSCOPY (EGD) WITH PROPOFOL;  Surgeon: Meridee Score Netty Starring., MD;  Location: [name];  Service: Gastroenterology;  Laterality: N/A;   EUS N/A 07/13/2021   Procedure: UPPER ENDOSCOPIC ULTRASOUND (EUS) LINEAR;  Surgeon: Lemar Lofty., MD;  Location: [name];  Service: Gastroenterology;  Laterality: N/A;   FINE NEEDLE ASPIRATION  07/13/2021   Procedure: FINE NEEDLE ASPIRATION (FNA) LINEAR;  Surgeon: Lemar Lofty., MD;  Location: [facility];  Service: Gastroenterology;;   IR IMAGING GUIDED PORT INSERTION  07/31/2021   SPHINCTEROTOMY  07/13/2021   Procedure: Dennison Mascot;  Surgeon: Vida Rigger, MD;  Location: [name];  Service: Gastroenterology;;    SOCIAL HISTORY: Social History   Socioeconomic History   Marital status: Single    Spouse name: Not on file   Number of children: 0   Years of education: Not on file   Highest education level: Some college, no degree  Occupational History   Not on file  Tobacco Use   Smoking status: Some Days    Types: Cigarettes   Smokeless tobacco: Never  Vaping Use   Vaping Use: Never used  Substance and Sexual Activity   Alcohol use: Yes    Comment: social   Drug use: No   Sexual activity: Never  Other Topics Concern   Not on file  Social History Narrative   Not on file   Social Determinants of Health   Financial Resource Strain: Low Risk  (04/14/2017)   Overall Financial Resource Strain (CARDIA)     Difficulty of Paying Living Expenses: Not hard at all  Food Insecurity: No Food Insecurity (11/07/2021)   Hunger Vital Sign    Worried About Running Out of Food in the Last Year: Never true    Ran Out of Food in the Last Year: Never true  Transportation Needs: No Transportation Needs (11/07/2021)   PRAPARE - Administrator, Civil Service (Medical): No    Lack of Transportation (Non-Medical): No  Physical Activity: Inactive (04/14/2017)   Exercise Vital Sign    Days of Exercise per Week: 0 days    Minutes of Exercise per Session: 0 min  Stress: Stress Concern Present (04/14/2017)   [name] of Occupational Health - Occupational Stress Questionnaire    Feeling of Stress : Rather much  Social Connections: Moderately Isolated (04/14/2017)   Social Connection and Isolation Panel [NHANES]    Frequency of Communication with Friends and Family: More than three times a week    Frequency of Social Gatherings with Friends and Family: More than three times a week    Attends Religious Services: Never    Database administrator or Organizations: No    Attends Banker Meetings: Never    Marital Status: Never married  Intimate Partner Violence: Not At Risk (11/07/2021)   Humiliation, Afraid, Rape, and Kick questionnaire  Fear of Current or Ex-Partner: No    Emotionally Abused: No    Physically Abused: No    Sexually Abused: No    FAMILY HISTORY: Family History  Problem Relation Age of Onset   Drug abuse Father    ALLERGIES:  is allergic to shellfish allergy and other.  MEDICATIONS:  Current Outpatient Medications  Medication Sig Dispense Refill   acetaminophen (TYLENOL) 500 MG tablet Take 1,000 mg by mouth every 6 (six) hours as needed for mild pain.     albuterol (VENTOLIN HFA) 108 (90 Base) MCG/ACT inhaler INHALE 2 PUFFS BY MOUTH EVERY 4 HOURS AS NEEDED FOR WHEEZING FOR SHORTNESS OF BREATH 18 g 0   dexamethasone (DECADRON) 4 MG tablet Take 2 tablets (8 mg  total) by mouth daily. Start the day after chemotherapy for 2 days. Take with food. 8 tablet 5   diclofenac Sodium (VOLTAREN) 1 % GEL Apply 4 g topically 4 (four) times daily. (Patient taking differently: Apply 4 g topically 4 (four) times daily as needed (pain).) 100 g 0   lidocaine-prilocaine (EMLA) cream Apply to affected area once 30 g 3   loperamide (IMODIUM) 2 MG capsule Take 2 tabs by mouth with first loose stool, then 1 tab with each additional loose stool as needed. Do not exceed 8 tabs in a 24-hour period 30 capsule 0   methocarbamol (ROBAXIN) 500 MG tablet Take 1 tablet (500 mg total) by mouth 2 (two) times daily. 20 tablet 0   ondansetron (ZOFRAN) 8 MG tablet Take 1 tablet (8 mg total) by mouth every 8 (eight) hours as needed for nausea, vomiting or refractory nausea / vomiting. Start on the third day after chemotherapy. 30 tablet 1   oxyCODONE (OXY IR/ROXICODONE) 5 MG immediate release tablet Take 1 tablet (5 mg total) by mouth every 6 (six) hours as needed for severe pain or moderate pain. 20 tablet 0   polyethylene glycol (MIRALAX / GLYCOLAX) 17 g packet Take 17 g by mouth daily. (Patient taking differently: Take 17 g by mouth daily as needed for mild constipation or moderate constipation.) 14 each 0   prochlorperazine (COMPAZINE) 10 MG tablet Take 1 tablet (10 mg total) by mouth every 6 (six) hours as needed for nausea or vomiting. 30 tablet 1   senna-docusate (SENOKOT-S) 8.6-50 MG tablet Take 2 tablets by mouth 2 (two) times daily. (Patient taking differently: Take 2 tablets by mouth daily as needed for mild constipation or moderate constipation.) 30 tablet 0   No current facility-administered medications for this visit.    REVIEW OF SYSTEMS:   .10 Point review of Systems was done is negative except as noted above.  PHYSICAL EXAMINATION: ECOG PERFORMANCE STATUS: 1 - Symptomatic but completely ambulatory VSS GENERAL:alert, in no acute distress and comfortable SKIN: no acute  rashes, no significant lesions EYES: conjunctiva are pink and non-injected, sclera anicteric OROPHARYNX: MMM, no exudates, no oropharyngeal erythema or ulceration NECK: supple, no JVD LYMPH:  no palpable lymphadenopathy in the cervical, axillary or inguinal regions LUNGS: clear to auscultation b/l with normal respiratory effort HEART: regular rate & rhythm ABDOMEN:  normoactive bowel sounds , non tender, not distended. Extremity: no pedal edema PSYCH: alert & oriented x 3 with fluent speech NEURO: no focal motor/sensory deficits   LABORATORY DATA:  I have reviewed the data as listed    Latest Ref Rng & Units 08/13/2022   11:31 AM 07/31/2022    8:54 AM 07/17/2022    9:09 AM  CBC  WBC  4.0 - 10.5 K/uL 4.7  5.2  3.4   Hemoglobin 13.0 - 17.0 g/dL 16.1  09.6  04.5   Hematocrit 39.0 - 52.0 % 33.9  36.5  35.0   Platelets 150 - 400 K/uL 196  176  111       Latest Ref Rng & Units 08/13/2022   11:31 AM 07/31/2022    8:54 AM 07/17/2022    9:09 AM  CMP  Glucose 70 - 99 mg/dL 99  409  811   BUN 6 - 20 mg/dL 11  12  9    Creatinine 0.61 - 1.24 mg/dL 9.14  7.82  9.56   Sodium 135 - 145 mmol/L 140  138  141   Potassium 3.5 - 5.1 mmol/L 3.7  3.9  3.4   Chloride 98 - 111 mmol/L 108  105  107   CO2 22 - 32 mmol/L 26  27  27    Calcium 8.9 - 10.3 mg/dL 9.4  9.7  9.5   Total Protein 6.5 - 8.1 g/dL 7.3  7.8  7.4   Total Bilirubin 0.3 - 1.2 mg/dL 0.5  0.9  0.9   Alkaline Phos 38 - 126 U/L 374  498  376   AST 15 - 41 U/L 49  134  93   ALT 0 - 44 U/L 132  236  215    Cytology done 07/13/2021 revealed "FINAL MICROSCOPIC DIAGNOSIS:  A. PANCREAS, HEAD, FINE NEEDLE ASPIRATION:  - Malignant cells present  - Poorly differentiated/high-grade neuroendocrine carcinoma (see  comment)   B. CBD STRICTURE, BRUSHING:  - Atypical cells suspicious for tumor   C. BILIARY DILATION, BALLOON, REMOVAL:  - Atypical cells suspicious for tumor "  Surgical pathology done 07/13/2021 revealed "FINAL MICROSCOPIC DIAGNOSIS:    A. STOMACH, BIOPSY:  Reactive gastropathy and minimal chronic gastritis with lymphoid  aggregate  Negative for H. pylori, intestinal metaplasia, dysplasia and carcinoma"   Pathology 11/07/21: FINAL MICROSCOPIC DIAGNOSIS:   A. GALLBLADDER, CHOLECYSTECTOMY:  - Acute cholecystitis with necrosis and abscess.   RADIOGRAPHIC STUDIES: I have personally reviewed the radiological images as listed and agreed with the findings in the report. No results found.   ASSESSMENT & PLAN:   32 y.o. very pleasant male with  1. 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 -Cytology done 07/13/2021 shows presence of malignant cells in pancreas and poorly differentiated/high grade neuroendocrine carcinoma. PLAN:  -Discussed lab results from today, 08/13/2022, with the patient.  CBC shows decreased hemoglobin at 11.2 g/dL and decreased hematocrit at 33.9%.  CMP showed improved LFTs and stable creatinine -Patient tolerated his cycle 1 of his Folfiri treatment without any new or notable toxicities.  -Patient can proceed with cycle 2 of Folfiri without any dose modification.  Chemotherapy orders reviewed and signed.  FOLLOW-UP: -Schedule per integrated scheduling . Visit with Karena Addison for next cycle of FOLFIRI on 7/24 -MD visit with susbequent cycle of FOLFIRI   The total time spent in the appointment was 30 minutes* .  All of the patient's questions were answered with apparent satisfaction. The patient knows to call the clinic with any problems, questions or concerns.   Wyvonnia Lora MD MS AAHIVMS [facility] Hematology/Oncology Physician [facility]  .*Total Encounter Time as defined by the Centers for Medicare and Medicaid Services includes, in addition to the face-to-face time of a patient visit (documented in the note above) non-face-to-face time: obtaining and reviewing outside history,  ordering and  reviewing medications, tests or procedures, care coordination (communications with other health care professionals or caregivers) and documentation in the medical record.   I,Param Shah,acting as a Neurosurgeon for Wyvonnia Lora, MD.,have documented all relevant documentation on the behalf of Wyvonnia Lora, MD,as directed by  Wyvonnia Lora, MD while in the presence of Wyvonnia Lora, MD.  .I have reviewed the above documentation for accuracy and completeness, and I agree with the above. [name] MD

## 2022-08-15 ENCOUNTER — Inpatient Hospital Stay: Payer: 59

## 2022-08-15 VITALS — BP 124/82 | HR 85 | Temp 98.2°F | Resp 18

## 2022-08-15 DIAGNOSIS — C7A1 Malignant poorly differentiated neuroendocrine tumors: Secondary | ICD-10-CM

## 2022-08-15 DIAGNOSIS — Z7189 Other specified counseling: Secondary | ICD-10-CM

## 2022-08-15 DIAGNOSIS — Z5111 Encounter for antineoplastic chemotherapy: Secondary | ICD-10-CM | POA: Diagnosis not present

## 2022-08-15 MED ORDER — HEPARIN SOD (PORK) LOCK FLUSH 100 UNIT/ML IV SOLN
500.0000 [IU] | Freq: Once | INTRAVENOUS | Status: AC | PRN
  Administered 2022-08-15: 500 [IU]

## 2022-08-15 MED ORDER — SODIUM CHLORIDE 0.9% FLUSH
10.0000 mL | INTRAVENOUS | Status: DC | PRN
  Administered 2022-08-15: 10 mL

## 2022-08-19 ENCOUNTER — Encounter: Payer: Self-pay | Admitting: Hematology

## 2022-08-27 ENCOUNTER — Encounter: Payer: Self-pay | Admitting: Hematology

## 2022-08-27 MED FILL — Dexamethasone Sodium Phosphate Inj 100 MG/10ML: INTRAMUSCULAR | Qty: 1 | Status: AC

## 2022-08-28 ENCOUNTER — Inpatient Hospital Stay: Payer: 59

## 2022-08-28 ENCOUNTER — Inpatient Hospital Stay (HOSPITAL_BASED_OUTPATIENT_CLINIC_OR_DEPARTMENT_OTHER): Payer: 59 | Admitting: Physician Assistant

## 2022-08-28 ENCOUNTER — Encounter: Payer: Self-pay | Admitting: Hematology

## 2022-08-28 VITALS — BP 127/87 | HR 90 | Temp 97.3°F | Resp 20 | Wt 288.5 lb

## 2022-08-28 VITALS — BP 122/78 | HR 78 | Resp 18

## 2022-08-28 DIAGNOSIS — Z7189 Other specified counseling: Secondary | ICD-10-CM

## 2022-08-28 DIAGNOSIS — C7A1 Malignant poorly differentiated neuroendocrine tumors: Secondary | ICD-10-CM | POA: Diagnosis not present

## 2022-08-28 DIAGNOSIS — Z5111 Encounter for antineoplastic chemotherapy: Secondary | ICD-10-CM

## 2022-08-28 DIAGNOSIS — Z95828 Presence of other vascular implants and grafts: Secondary | ICD-10-CM

## 2022-08-28 LAB — CBC WITH DIFFERENTIAL (CANCER CENTER ONLY)
Abs Immature Granulocytes: 0.01 10*3/uL (ref 0.00–0.07)
Basophils Absolute: 0 10*3/uL (ref 0.0–0.1)
Basophils Relative: 0 %
Eosinophils Absolute: 0.1 10*3/uL (ref 0.0–0.5)
Eosinophils Relative: 3 %
HCT: 34.7 % — ABNORMAL LOW (ref 39.0–52.0)
Hemoglobin: 11.7 g/dL — ABNORMAL LOW (ref 13.0–17.0)
Immature Granulocytes: 0 %
Lymphocytes Relative: 21 %
Lymphs Abs: 0.8 10*3/uL (ref 0.7–4.0)
MCH: 30.8 pg (ref 26.0–34.0)
MCHC: 33.7 g/dL (ref 30.0–36.0)
MCV: 91.3 fL (ref 80.0–100.0)
Monocytes Absolute: 0.6 10*3/uL (ref 0.1–1.0)
Monocytes Relative: 16 %
Neutro Abs: 2.2 10*3/uL (ref 1.7–7.7)
Neutrophils Relative %: 60 %
Platelet Count: 141 10*3/uL — ABNORMAL LOW (ref 150–400)
RBC: 3.8 MIL/uL — ABNORMAL LOW (ref 4.22–5.81)
RDW: 14.4 % (ref 11.5–15.5)
WBC Count: 3.7 10*3/uL — ABNORMAL LOW (ref 4.0–10.5)
nRBC: 0 % (ref 0.0–0.2)

## 2022-08-28 LAB — CMP (CANCER CENTER ONLY)
ALT: 32 U/L (ref 0–44)
AST: 26 U/L (ref 15–41)
Albumin: 3.8 g/dL (ref 3.5–5.0)
Alkaline Phosphatase: 170 U/L — ABNORMAL HIGH (ref 38–126)
Anion gap: 6 (ref 5–15)
BUN: 11 mg/dL (ref 6–20)
CO2: 26 mmol/L (ref 22–32)
Calcium: 9.4 mg/dL (ref 8.9–10.3)
Chloride: 107 mmol/L (ref 98–111)
Creatinine: 0.73 mg/dL (ref 0.61–1.24)
GFR, Estimated: >60 mL/min (ref >60)
Glucose, Bld: 118 mg/dL — ABNORMAL HIGH (ref 70–99)
Potassium: 3.4 mmol/L — ABNORMAL LOW (ref 3.5–5.1)
Sodium: 139 mmol/L (ref 135–145)
Total Bilirubin: 0.6 mg/dL (ref 0.3–1.2)
Total Protein: 6.9 g/dL (ref 6.5–8.1)

## 2022-08-28 MED ORDER — SODIUM CHLORIDE 0.9 % IV SOLN
10.0000 mg | Freq: Once | INTRAVENOUS | Status: AC
  Administered 2022-08-28: 10 mg via INTRAVENOUS
  Filled 2022-08-28: qty 10

## 2022-08-28 MED ORDER — SODIUM CHLORIDE 0.9 % IV SOLN
180.0000 mg/m2 | Freq: Once | INTRAVENOUS | Status: AC
  Administered 2022-08-28: 460 mg via INTRAVENOUS
  Filled 2022-08-28: qty 15

## 2022-08-28 MED ORDER — SODIUM CHLORIDE 0.9 % IV SOLN
400.0000 mg/m2 | Freq: Once | INTRAVENOUS | Status: AC
  Administered 2022-08-28: 1020 mg via INTRAVENOUS
  Filled 2022-08-28: qty 50

## 2022-08-28 MED ORDER — FLUOROURACIL CHEMO INJECTION 2.5 GM/50ML
400.0000 mg/m2 | Freq: Once | INTRAVENOUS | Status: AC
  Administered 2022-08-28: 1000 mg via INTRAVENOUS
  Filled 2022-08-28: qty 20

## 2022-08-28 MED ORDER — SODIUM CHLORIDE 0.9% FLUSH
10.0000 mL | Freq: Once | INTRAVENOUS | Status: AC
  Administered 2022-08-28: 10 mL

## 2022-08-28 MED ORDER — PALONOSETRON HCL INJECTION 0.25 MG/5ML
0.2500 mg | Freq: Once | INTRAVENOUS | Status: AC
  Administered 2022-08-28: 0.25 mg via INTRAVENOUS
  Filled 2022-08-28: qty 5

## 2022-08-28 MED ORDER — SODIUM CHLORIDE 0.9 % IV SOLN: Freq: Once | INTRAVENOUS | Status: AC

## 2022-08-28 MED ORDER — SODIUM CHLORIDE 0.9 % IV SOLN
2400.0000 mg/m2 | INTRAVENOUS | Status: DC
  Administered 2022-08-28: 6100 mg via INTRAVENOUS
  Filled 2022-08-28: qty 122

## 2022-08-28 NOTE — Patient Instructions (Signed)
[facility]  Discharge Instructions: Thank you for choosing [facility] to provide your oncology and hematology care.   If you have a lab appointment with the [facility], please go directly to the [facility] and check in at the registration area.   Wear comfortable clothing and clothing appropriate for easy access to any Portacath or PICC line.   We strive to give you quality time with your provider. You may need to reschedule your appointment if you arrive late (15 or more minutes).  Arriving late affects you and other patients whose appointments are after yours.  Also, if you miss three or more appointments without notifying the office, you may be dismissed from the clinic at the provider's discretion.      For prescription refill requests, have your pharmacy contact our office and allow 72 hours for refills to be completed.    Today you received the following chemotherapy and/or immunotherapy agents: Irinotecan, Leucovorin, Fluorouracil.       To help prevent nausea and vomiting after your treatment, we encourage you to take your nausea medication as directed.  BELOW ARE SYMPTOMS THAT SHOULD BE REPORTED IMMEDIATELY: *FEVER GREATER THAN 100.4 F (38 C) OR HIGHER *CHILLS OR SWEATING *NAUSEA AND VOMITING THAT IS NOT CONTROLLED WITH YOUR NAUSEA MEDICATION *UNUSUAL SHORTNESS OF BREATH *UNUSUAL BRUISING OR BLEEDING *URINARY PROBLEMS (pain or burning when urinating, or frequent urination) *BOWEL PROBLEMS (unusual diarrhea, constipation, pain near the anus) TENDERNESS IN MOUTH AND THROAT WITH OR WITHOUT PRESENCE OF ULCERS (sore throat, sores in mouth, or a toothache) UNUSUAL RASH, SWELLING OR PAIN  UNUSUAL VAGINAL DISCHARGE OR ITCHING   Items with * indicate a potential emergency and should be followed up as soon as possible or go to the Emergency Department if any problems should occur.  Please show the CHEMOTHERAPY ALERT CARD or  IMMUNOTHERAPY ALERT CARD at check-in to the Emergency Department and triage nurse.  Should you have questions after your visit or need to cancel or reschedule your appointment, please contact [facility]  Dept: [phone]  and follow the prompts.  Office hours are 8:00 a.m. to 4:30 p.m. Monday - Friday. Please note that voicemails left after 4:00 p.m. may not be returned until the following business day.  We are closed weekends and major holidays. You have access to a nurse at all times for urgent questions. Please call the main number to the clinic Dept: [phone] and follow the prompts.   For any non-urgent questions, you may also contact your provider using MyChart. We now offer e-Visits for anyone 25 and older to request care online for non-urgent symptoms. For details visit [email or web address].   Also download the MyChart app! Go to the app store, search "MyChart", open the app, select [facility], and log in with your MyChart username and password.

## 2022-08-30 ENCOUNTER — Inpatient Hospital Stay: Payer: 59

## 2022-08-30 ENCOUNTER — Other Ambulatory Visit: Payer: Self-pay

## 2022-08-30 VITALS — BP 110/78 | HR 96 | Temp 99.3°F | Resp 18

## 2022-08-30 DIAGNOSIS — Z7189 Other specified counseling: Secondary | ICD-10-CM

## 2022-08-30 DIAGNOSIS — C7A1 Malignant poorly differentiated neuroendocrine tumors: Secondary | ICD-10-CM

## 2022-08-30 DIAGNOSIS — Z5111 Encounter for antineoplastic chemotherapy: Secondary | ICD-10-CM | POA: Diagnosis not present

## 2022-08-30 MED ORDER — HEPARIN SOD (PORK) LOCK FLUSH 100 UNIT/ML IV SOLN
500.0000 [IU] | Freq: Once | INTRAVENOUS | Status: AC | PRN
  Administered 2022-08-30: 500 [IU]

## 2022-08-30 MED ORDER — SODIUM CHLORIDE 0.9% FLUSH
10.0000 mL | INTRAVENOUS | Status: DC | PRN
  Administered 2022-08-30: 10 mL

## 2022-08-30 NOTE — Progress Notes (Unsigned)
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  04/24/2022-07/17/2022: Received 6 cycles of FOLFOX, oxaliplatin held with 6th cycle ***  CURRENT TREATMENT: FOLFIRI    INTERVAL HISTORY:  Leon Liv. Is a 32 y.o. male returns today for a follow up for high grade neuroendocrine carcinoma prior to Cycle 2, Day 1 of FOLFOX chemotherapy. He was last seen by Dr. Candise Fischer on 04/08/2022 and in the interim, he has started FOLFOX chemotherapy.  [facility] reports that he tolerated his first cycle of chemotherapy without any significant limitations. His energy and appetite are fairly stable. He has lost approximately 12 lbs since 04/08/2022. He denies nausea, vomiting or abdominal pain. His bowel habits are unchanged without recurrent episodes of diarrhea or constipation.  He denies easy bruising or signs of active bleeding. He experienced mild cold sensitivity in his fingertips but denies any residual neuropathy. He denies fevers, chills, night sweats, shortness of breath, chest pain or cough. He has no other complaints.   MEDICAL HISTORY:  Past Medical History:  Diagnosis Date   Asthma    Neuroendocrine cancer (HCC)     SURGICAL HISTORY: Past Surgical History:  Procedure Laterality Date   BILIARY DILATION  07/13/2021   Procedure: BILIARY DILATION;  Surgeon: Leon Rigger, MD;  [facility];  Service: Gastroenterology;;   BILIARY STENT PLACEMENT N/A 07/13/2021   Procedure: BILIARY STENT PLACEMENT;  Surgeon: Leon Rigger, MD;  Location: [name];  Service: Gastroenterology;  Laterality: N/A;    BILIARY STENT PLACEMENT N/A 11/05/2021   Procedure: BILIARY STENT PLACEMENT;  Surgeon: Leon Rigger, MD;  Location: [name];  Service: Gastroenterology;  Laterality: N/A;   BIOPSY  07/13/2021   Procedure: BIOPSY;  Surgeon: [facility]., MD;  [facility];  Service: Gastroenterology;;   CHOLECYSTECTOMY N/A 11/07/2021   Procedure: LAPAROSCOPIC CHOLECYSTECTOMY;  Surgeon: Leon Bue, MD;  Location: [facility];  Service: General;  Laterality: N/A;   ENDOSCOPIC RETROGRADE CHOLANGIOPANCREATOGRAPHY (ERCP) WITH PROPOFOL N/A 07/13/2021   Procedure: ENDOSCOPIC RETROGRADE CHOLANGIOPANCREATOGRAPHY (ERCP) WITH PROPOFOL;  Surgeon: [facility] Rigger, MD;  Location: [name];  Service: Gastroenterology;  Laterality: N/A;   ERCP N/A 11/05/2021   Procedure: ENDOSCOPIC RETROGRADE CHOLANGIOPANCREATOGRAPHY (ERCP);  Surgeon: [facility] Rigger, MD;  [facility];  Service: Gastroenterology;  Laterality: N/A;   ESOPHAGOGASTRODUODENOSCOPY (EGD) WITH PROPOFOL N/A 07/13/2021   Procedure: ESOPHAGOGASTRODUODENOSCOPY (EGD) WITH PROPOFOL;  Surgeon: [facility]., MD;  Location: [name];  Service: Gastroenterology;  Laterality: N/A;   EUS N/A 07/13/2021   Procedure: UPPER ENDOSCOPIC ULTRASOUND (EUS) LINEAR;  Surgeon: [facility]., MD;  Location: [name];  Service: Gastroenterology;  Laterality: N/A;   FINE NEEDLE ASPIRATION  07/13/2021   Procedure: FINE NEEDLE ASPIRATION (FNA) LINEAR;  Surgeon: [facility]., MD;  [facility];  Service: Gastroenterology;;   IR IMAGING GUIDED PORT INSERTION  07/31/2021   SPHINCTEROTOMY  07/13/2021   Procedure: [facility];  Surgeon: Leon Rigger, MD;  Location: [name];  Service: Gastroenterology;;    SOCIAL HISTORY: Social History   Socioeconomic History   Marital status: Single    Spouse name: Not on file   Number of children: 0   Years of education: Not on file  Highest education level: Some college, no degree   Occupational History   Not on file  Tobacco Use   Smoking status: Some Days    Types: Cigarettes   Smokeless tobacco: Never  Vaping Use   Vaping status: Never Used  Substance and Sexual Activity   Alcohol use: Yes    Comment: social   Drug use: No   Sexual activity: Never  Other Topics Concern   Not on file  Social History Narrative   Not on file   Social Determinants of Health   Financial Resource Strain: Low Risk  (04/14/2017)   Overall Financial Resource Strain (CARDIA)    Difficulty of Paying Living Expenses: Not hard at all  Food Insecurity: No Food Insecurity (11/07/2021)   Hunger Vital Sign    Worried About Running Out of Food in the Last Year: Never true    Ran Out of Food in the Last Year: Never true  Transportation Needs: No Transportation Needs (11/07/2021)   PRAPARE - Administrator, Civil Service (Medical): No    Lack of Transportation (Non-Medical): No  Physical Activity: Inactive (04/14/2017)   Exercise Vital Sign    Days of Exercise per Week: 0 days    Minutes of Exercise per Session: 0 min  Stress: Stress Concern Present (04/14/2017)   [facility] [employer] - Occupational Stress Questionnaire    Feeling of Stress : Rather much  Social Connections: Unknown (06/19/2021)   Received from [facility]   Social Network    Social Network: Not on file  Intimate Partner Violence: Not At Risk (11/07/2021)   Humiliation, Afraid, Rape, and Kick questionnaire    Fear of Current or Ex-Partner: No    Emotionally Abused: No    Physically Abused: No    Sexually Abused: No    FAMILY HISTORY: Family History  Problem Relation Age of Onset   Drug abuse Father    ALLERGIES:  is allergic to shellfish allergy and other.  MEDICATIONS:  Current Outpatient Medications  Medication Sig Dispense Refill   acetaminophen (TYLENOL) 500 MG tablet Take 1,000 mg by mouth every 6 (six) hours as needed for mild pain.     albuterol (VENTOLIN HFA) 108  (90 Base) MCG/ACT inhaler INHALE 2 PUFFS BY MOUTH EVERY 4 HOURS AS NEEDED FOR WHEEZING FOR SHORTNESS OF BREATH 18 g 0   dexamethasone (DECADRON) 4 MG tablet Take 2 tablets (8 mg total) by mouth daily. Start the day after chemotherapy for 2 days. Take with food. 8 tablet 5   diclofenac Sodium (VOLTAREN) 1 % GEL Apply 4 g topically 4 (four) times daily. (Patient taking differently: Apply 4 g topically 4 (four) times daily as needed (pain).) 100 g 0   lidocaine-prilocaine (EMLA) cream Apply to affected area once 30 g 3   loperamide (IMODIUM) 2 MG capsule Take 2 tabs by mouth with first loose stool, then 1 tab with each additional loose stool as needed. Do not exceed 8 tabs in a 24-hour period 30 capsule 0   methocarbamol (ROBAXIN) 500 MG tablet Take 1 tablet (500 mg total) by mouth 2 (two) times daily. 20 tablet 0   ondansetron (ZOFRAN) 8 MG tablet Take 1 tablet (8 mg total) by mouth every 8 (eight) hours as needed for nausea, vomiting or refractory nausea / vomiting. Start on the third day after chemotherapy. 30 tablet 1   oxyCODONE (OXY IR/ROXICODONE) 5 MG immediate release tablet Take 1 tablet (5 mg total) by mouth every 6 (  six) hours as needed for severe pain or moderate pain. 20 tablet 0   polyethylene glycol (MIRALAX / GLYCOLAX) 17 g packet Take 17 g by mouth daily. (Patient taking differently: Take 17 g by mouth daily as needed for mild constipation or moderate constipation.) 14 each 0   prochlorperazine (COMPAZINE) 10 MG tablet Take 1 tablet (10 mg total) by mouth every 6 (six) hours as needed for nausea or vomiting. 30 tablet 1   senna-docusate (SENOKOT-S) 8.6-50 MG tablet Take 2 tablets by mouth 2 (two) times daily. (Patient taking differently: Take 2 tablets by mouth daily as needed for mild constipation or moderate constipation.) 30 tablet 0   No current facility-administered medications for this visit.    REVIEW OF SYSTEMS:    10 Point review of Systems was done is negative except as noted  above.  PHYSICAL EXAMINATION: ECOG PERFORMANCE STATUS: 1 - Symptomatic but completely ambulatory  05/08/22  Weight 274 lb 8 oz (124.5 kg)  Temp 98.6 F (37 C)  Temp src Oral  Pulse 106 !  Resp 18  BP 126/91 (H)   GENERAL:alert, in no acute distress and comfortable SKIN: no acute rashes, no significant lesions EYES: conjunctiva are pink and non-injected, sclera anicteric NECK: supple, no JVD LUNGS: clear to auscultation b/l with normal respiratory effort HEART: regular rate & rhythm Extremity: no pedal edema PSYCH: alert & oriented x 3 with fluent speech NEURO: no focal motor/sensory deficits  LABORATORY DATA:  I have reviewed the data as listed  .    Latest Ref Rng & Units 08/28/2022    1:06 PM 08/13/2022   11:31 AM 07/31/2022    8:54 AM  CBC  WBC 4.0 - 10.5 K/uL 3.7  4.7  5.2   Hemoglobin 13.0 - 17.0 g/dL 13.0  86.5  78.4   Hematocrit 39.0 - 52.0 % 34.7  33.9  36.5   Platelets 150 - 400 K/uL 141  196  176     .    Latest Ref Rng & Units 08/28/2022    1:06 PM 08/13/2022   11:31 AM 07/31/2022    8:54 AM  CMP  Glucose 70 - 99 mg/dL 696  99  295   BUN 6 - 20 mg/dL 11  11  12    Creatinine 0.61 - 1.24 mg/dL 2.84  1.32  4.40   Sodium 135 - 145 mmol/L 139  140  138   Potassium 3.5 - 5.1 mmol/L 3.4  3.7  3.9   Chloride 98 - 111 mmol/L 107  108  105   CO2 22 - 32 mmol/L 26  26  27    Calcium 8.9 - 10.3 mg/dL 9.4  9.4  9.7   Total Protein 6.5 - 8.1 g/dL 6.9  7.3  7.8   Total Bilirubin 0.3 - 1.2 mg/dL 0.6  0.5  0.9   Alkaline Phos 38 - 126 U/L 170  374  498   AST 15 - 41 U/L 26  49  134   ALT 0 - 44 U/L 32  132  236    Cytology done 07/13/2021 revealed "FINAL MICROSCOPIC DIAGNOSIS:  A. PANCREAS, HEAD, FINE NEEDLE ASPIRATION:  - Malignant cells present  - Poorly differentiated/high-grade neuroendocrine carcinoma (see  comment)   B. CBD STRICTURE, BRUSHING:  - Atypical cells suspicious for tumor   C. BILIARY DILATION, BALLOON, REMOVAL:  - Atypical cells suspicious for  tumor "  Surgical pathology done 07/13/2021 revealed "FINAL MICROSCOPIC DIAGNOSIS:   A. STOMACH, BIOPSY:  Reactive gastropathy and minimal chronic gastritis with lymphoid  aggregate  Negative for H. pylori, intestinal metaplasia, dysplasia and carcinoma"  RADIOGRAPHIC STUDIES: I have personally reviewed the radiological images as listed and agreed with the findings in the report. No results found.   ASSESSMENT & PLAN:  [facility]. Is a 32 y.o. male who presents for a follow up for high grade neuroendocrine carcinoma.   1. 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 -Cytology done 07/13/2021 shows presence of malignant cells in pancreas and poorly differentiated/high grade neuroendocrine carcinoma. -Received 6 cycles of first line chemotherapy with carboplatin and etoposide from 08/01/2021-12/26/2021.Treatment was discontinued after CT CAP from 04/04/2022 showed progression of disease. -Started second line chemotherapy with FOLFOX on 04/24/2022.  Plan -Due for Cycle 2 of FOLFOX today -Labs from today were reviewed with patient. WBC 2.7, ANC 1.6, Hgb 11.6, Plt 178K. LFTS are markedly elevated with AST 586 and ALT 1222, Alk phos 349. Tbili is normal at 0.7 -Suspect drug toxicity as cause of elevated liver enzymes. Recommend to hold chemotherapy today. -Repeat labs on 05/09/2021 to monitor LFTs. If levels are not improving and/or Tbili is elevated, we will obtain repeat abdominal imaging to rule out progressive liver disease.   Follow up: --RTC in one week for port labs and f/u visit with Dr. Candise [facility] before Cycle 2 of FOLFOX  All of the patient's questions were answered with apparent satisfaction. The patient knows to call the clinic with any problems, questions or concerns.  I have spent a total of 30 minutes minutes of face-to-face and non-face-to-face time, preparing to see the patient, , performing a medically appropriate  examination, counseling and educating the patient,documenting clinical information in the electronic health record, and care coordination.   Georga Kaufmann PA-C Dept of Hematology and Oncology [facility] at [facility] Phone: [phone]

## 2022-09-01 ENCOUNTER — Encounter: Payer: Self-pay | Admitting: Hematology

## 2022-09-04 ENCOUNTER — Other Ambulatory Visit: Payer: Self-pay

## 2022-09-11 ENCOUNTER — Other Ambulatory Visit: Payer: Self-pay

## 2022-09-11 ENCOUNTER — Inpatient Hospital Stay (HOSPITAL_BASED_OUTPATIENT_CLINIC_OR_DEPARTMENT_OTHER): Payer: 59 | Admitting: Physician Assistant

## 2022-09-11 ENCOUNTER — Inpatient Hospital Stay: Payer: 59

## 2022-09-11 ENCOUNTER — Inpatient Hospital Stay: Payer: 59 | Attending: Hematology

## 2022-09-11 VITALS — BP 132/90 | HR 90 | Temp 97.8°F | Resp 17 | Wt 296.3 lb

## 2022-09-11 DIAGNOSIS — Z7189 Other specified counseling: Secondary | ICD-10-CM | POA: Diagnosis not present

## 2022-09-11 DIAGNOSIS — C7B8 Other secondary neuroendocrine tumors: Secondary | ICD-10-CM | POA: Diagnosis not present

## 2022-09-11 DIAGNOSIS — F17210 Nicotine dependence, cigarettes, uncomplicated: Secondary | ICD-10-CM | POA: Diagnosis not present

## 2022-09-11 DIAGNOSIS — Z5111 Encounter for antineoplastic chemotherapy: Secondary | ICD-10-CM | POA: Diagnosis present

## 2022-09-11 DIAGNOSIS — C7A1 Malignant poorly differentiated neuroendocrine tumors: Secondary | ICD-10-CM

## 2022-09-11 DIAGNOSIS — Z95828 Presence of other vascular implants and grafts: Secondary | ICD-10-CM

## 2022-09-11 LAB — CBC WITH DIFFERENTIAL (CANCER CENTER ONLY)
Abs Immature Granulocytes: 0.01 10*3/uL (ref 0.00–0.07)
Basophils Absolute: 0 10*3/uL (ref 0.0–0.1)
Basophils Relative: 1 %
Eosinophils Absolute: 0.3 10*3/uL (ref 0.0–0.5)
Eosinophils Relative: 6 %
HCT: 32.5 % — ABNORMAL LOW (ref 39.0–52.0)
Hemoglobin: 11.2 g/dL — ABNORMAL LOW (ref 13.0–17.0)
Immature Granulocytes: 0 %
Lymphocytes Relative: 22 %
Lymphs Abs: 0.9 10*3/uL (ref 0.7–4.0)
MCH: 30.9 pg (ref 26.0–34.0)
MCHC: 34.5 g/dL (ref 30.0–36.0)
MCV: 89.8 fL (ref 80.0–100.0)
Monocytes Absolute: 0.4 10*3/uL (ref 0.1–1.0)
Monocytes Relative: 10 %
Neutro Abs: 2.5 10*3/uL (ref 1.7–7.7)
Neutrophils Relative %: 61 %
Platelet Count: 144 10*3/uL — ABNORMAL LOW (ref 150–400)
RBC: 3.62 MIL/uL — ABNORMAL LOW (ref 4.22–5.81)
RDW: 14.6 % (ref 11.5–15.5)
WBC Count: 4.1 10*3/uL (ref 4.0–10.5)
nRBC: 0 % (ref 0.0–0.2)

## 2022-09-11 LAB — CMP (CANCER CENTER ONLY)
ALT: 24 U/L (ref 0–44)
AST: 21 U/L (ref 15–41)
Albumin: 3.8 g/dL (ref 3.5–5.0)
Alkaline Phosphatase: 104 U/L (ref 38–126)
Anion gap: 5 (ref 5–15)
BUN: 9 mg/dL (ref 6–20)
CO2: 27 mmol/L (ref 22–32)
Calcium: 9 mg/dL (ref 8.9–10.3)
Chloride: 108 mmol/L (ref 98–111)
Creatinine: 0.71 mg/dL (ref 0.61–1.24)
GFR, Estimated: >60 mL/min (ref >60)
Glucose, Bld: 108 mg/dL — ABNORMAL HIGH (ref 70–99)
Potassium: 3.6 mmol/L (ref 3.5–5.1)
Sodium: 140 mmol/L (ref 135–145)
Total Bilirubin: 0.5 mg/dL (ref 0.3–1.2)
Total Protein: 6.9 g/dL (ref 6.5–8.1)

## 2022-09-11 MED ORDER — SODIUM CHLORIDE 0.9 % IV SOLN
10.0000 mg | Freq: Once | INTRAVENOUS | Status: DC
  Filled 2022-09-11: qty 1

## 2022-09-11 MED ORDER — SODIUM CHLORIDE 0.9 % IV SOLN
2400.0000 mg/m2 | INTRAVENOUS | Status: DC
  Administered 2022-09-11: 6100 mg via INTRAVENOUS
  Filled 2022-09-11: qty 122

## 2022-09-11 MED ORDER — SODIUM CHLORIDE 0.9 % IV SOLN
400.0000 mg/m2 | Freq: Once | INTRAVENOUS | Status: AC
  Administered 2022-09-11: 1020 mg via INTRAVENOUS
  Filled 2022-09-11: qty 25

## 2022-09-11 MED ORDER — SODIUM CHLORIDE 0.9% FLUSH
10.0000 mL | Freq: Once | INTRAVENOUS | Status: AC
  Administered 2022-09-11: 10 mL

## 2022-09-11 MED ORDER — FLUOROURACIL CHEMO INJECTION 2.5 GM/50ML
400.0000 mg/m2 | Freq: Once | INTRAVENOUS | Status: AC
  Administered 2022-09-11: 1000 mg via INTRAVENOUS
  Filled 2022-09-11: qty 20

## 2022-09-11 MED ORDER — PALONOSETRON HCL INJECTION 0.25 MG/5ML
0.2500 mg | Freq: Once | INTRAVENOUS | Status: AC
  Administered 2022-09-11: 0.25 mg via INTRAVENOUS
  Filled 2022-09-11: qty 5

## 2022-09-11 MED ORDER — SODIUM CHLORIDE 0.9 % IV SOLN: Freq: Once | INTRAVENOUS | Status: AC

## 2022-09-11 MED ORDER — SODIUM CHLORIDE 0.9 % IV SOLN
10.0000 mg | Freq: Once | INTRAVENOUS | Status: AC
  Administered 2022-09-11: 10 mg via INTRAVENOUS
  Filled 2022-09-11: qty 10

## 2022-09-11 MED ORDER — ATROPINE SULFATE 1 MG/ML IV SOLN: 0.5000 mg | Freq: Once | INTRAVENOUS | Status: DC | PRN

## 2022-09-11 MED ORDER — SODIUM CHLORIDE 0.9 % IV SOLN
180.0000 mg/m2 | Freq: Once | INTRAVENOUS | Status: AC
  Administered 2022-09-11: 460 mg via INTRAVENOUS
  Filled 2022-09-11: qty 4.07

## 2022-09-11 MED ORDER — SODIUM CHLORIDE 0.9% FLUSH
10.0000 mL | INTRAVENOUS | Status: DC | PRN
  Administered 2022-09-11: 10 mL

## 2022-09-11 NOTE — Patient Instructions (Signed)
[place] [facility]  Discharge Instructions: Thank you for choosing [facility] to provide your oncology and hematology care.   If you have a lab appointment with the Cancer Center, please go directly to the Cancer Center and check in at the registration area.   Wear comfortable clothing and clothing appropriate for easy access to any Portacath or PICC line.   We strive to give you quality time with your provider. You may need to reschedule your appointment if you arrive late (15 or more minutes).  Arriving late affects you and other patients whose appointments are after yours.  Also, if you miss three or more appointments without notifying the office, you may be dismissed from the clinic at the provider's discretion.      For prescription refill requests, have your pharmacy contact our office and allow 72 hours for refills to be completed.    Today you received the following chemotherapy and/or immunotherapy agents: Irinotecan/Leucovorin/Fluorouracil       To help prevent nausea and vomiting after your treatment, we encourage you to take your nausea medication as directed.  BELOW ARE SYMPTOMS THAT SHOULD BE REPORTED IMMEDIATELY: *FEVER GREATER THAN 100.4 F (38 C) OR HIGHER *CHILLS OR SWEATING *NAUSEA AND VOMITING THAT IS NOT CONTROLLED WITH YOUR NAUSEA MEDICATION *UNUSUAL SHORTNESS OF BREATH *UNUSUAL BRUISING OR BLEEDING *URINARY PROBLEMS (pain or burning when urinating, or frequent urination) *BOWEL PROBLEMS (unusual diarrhea, constipation, pain near the anus) TENDERNESS IN MOUTH AND THROAT WITH OR WITHOUT PRESENCE OF ULCERS (sore throat, sores in mouth, or a toothache) UNUSUAL RASH, SWELLING OR PAIN  UNUSUAL VAGINAL DISCHARGE OR ITCHING   Items with * indicate a potential emergency and should be followed up as soon as possible or go to the Emergency Department if any problems should occur.  Please show the CHEMOTHERAPY ALERT CARD or  IMMUNOTHERAPY ALERT CARD at check-in to the Emergency Department and triage nurse.  Should you have questions after your visit or need to cancel or reschedule your appointment, please contact [place] [facility]  Dept: [phone]  and follow the prompts.  Office hours are 8:00 a.m. to 4:30 p.m. Monday - Friday. Please note that voicemails left after 4:00 p.m. may not be returned until the following business day.  We are closed weekends and major holidays. You have access to a nurse at all times for urgent questions. Please call the main number to the clinic Dept: [phone] and follow the prompts.   For any non-urgent questions, you may also contact your provider using MyChart. We now offer e-Visits for anyone 59 and older to request care online for non-urgent symptoms. For details visit [email or web address].   Also download the MyChart app! Go to the app store, search "MyChart", open the app, select [place], and log in with your MyChart username and password.  The chemotherapy medication bag should finish at 46 hours, 96 hours, or 7 days. For example, if your pump is scheduled for 46 hours and it was put on at 4:00 p.m., it should finish at 2:00 p.m. the day it is scheduled to come off regardless of your appointment time.     Estimated time to finish at approximately 1 PM on 09/13/2022.   If the display on your pump reads "Low Volume" and it is beeping, take the batteries out of the pump and come to the cancer center for it to be taken off.   If the pump alarms  go off prior to the pump reading "Low Volume" then call [phone] and someone can assist you.  If the plunger comes out and the chemotherapy medication is leaking out, please use your home chemo spill kit to clean up the spill. Do NOT use paper towels or other household products.  If you have problems or questions regarding your pump, please call either [phone] (24 hours a day) or the  cancer center Monday-Friday 8:00 a.m.- 4:30 p.m. at the clinic number and we will assist you. If you are unable to get assistance, then go to the nearest Emergency Department and ask the staff to contact the IV team for assistance.

## 2022-09-11 NOTE — Progress Notes (Signed)
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  04/24/2022-07/17/2022: Received 6 cycles of FOLFOX, oxaliplatin discontinued with 6th cycle due to elevated LFTs.   CURRENT TREATMENT: FOLFIRI 07/31/22: Cycle 1 08/13/22: Cycle 2 08/28/22: Cycle 3 09/11/22: Cycle 4  INTERVAL HISTORY:  Leon Liv. Is a 32 y.o. male returns today for a follow up for high grade neuroendocrine carcinoma prior to Cycle 4, Day 1 of FOLFIRI chemotherapy.He presents unaccompanied for this visit.   [facility] is doing well and tolerating treatment. His energy is stable and he is able to complete his ADLs on his own. He denies nausea, vomiting or abdominal pain. He denies any bowel habit changes including recurrent episodes of diarrhea or constipation. He denies easy bruising or signs of active bleeding.He denies fevers, chills, night sweats, shortness of breath, chest pain or cough. He has no other complaints.  MEDICAL HISTORY:  Past Medical History:  Diagnosis Date   Asthma    Neuroendocrine cancer (HCC)     SURGICAL HISTORY: Past Surgical History:  Procedure Laterality Date   BILIARY DILATION  07/13/2021   Procedure: BILIARY DILATION;  Surgeon: Leon Rigger, MD;  [facility];  Service: Gastroenterology;;   BILIARY STENT PLACEMENT N/A 07/13/2021   Procedure: BILIARY STENT PLACEMENT;  Surgeon: Leon Rigger, MD;  Location: [name];  Service: Gastroenterology;  Laterality: N/A;   BILIARY STENT PLACEMENT N/A 11/05/2021   Procedure: BILIARY STENT PLACEMENT;  Surgeon: Leon Rigger, MD;  Location: [facility];  Service: Gastroenterology;  Laterality: N/A;   BIOPSY  07/13/2021   Procedure: BIOPSY;  Surgeon: Leon Score Netty Starring., MD;  Location[facility];  Service: Gastroenterology;;   CHOLECYSTECTOMY N/A 11/07/2021   Procedure: LAPAROSCOPIC CHOLECYSTECTOMY;  Surgeon: Leon Bue, MD;  Location: [facility];  Service: General;  Laterality: N/A;   ENDOSCOPIC RETROGRADE CHOLANGIOPANCREATOGRAPHY (ERCP) WITH PROPOFOL N/A 07/13/2021   Procedure: ENDOSCOPIC RETROGRADE CHOLANGIOPANCREATOGRAPHY (ERCP) WITH PROPOFOL;  Surgeon: Leon Rigger, MD;  Location: [name];  Service: Gastroenterology;  Laterality: N/A;   ERCP N/A 11/05/2021   Procedure: ENDOSCOPIC RETROGRADE CHOLANGIOPANCREATOGRAPHY (ERCP);  Surgeon: Leon Rigger, MD;  [facility];  Service: Gastroenterology;  Laterality: N/A;   ESOPHAGOGASTRODUODENOSCOPY (EGD) WITH PROPOFOL N/A 07/13/2021   Procedure: ESOPHAGOGASTRODUODENOSCOPY (EGD) WITH PROPOFOL;  Surgeon: Leon Score Netty Starring., MD;  Location: [name];  Service: Gastroenterology;  Laterality: N/A;   EUS N/A 07/13/2021   Procedure: UPPER ENDOSCOPIC ULTRASOUND (EUS) LINEAR;  Surgeon: Leon Lofty., MD;  Location: [name];  Service: Gastroenterology;  Laterality: N/A;   FINE NEEDLE ASPIRATION  07/13/2021   Procedure: FINE NEEDLE ASPIRATION (FNA) LINEAR;  Surgeon: Leon Score Netty Starring., MD;  [facility];  Service: Gastroenterology;;   IR IMAGING GUIDED PORT INSERTION  07/31/2021   SPHINCTEROTOMY  07/13/2021   Procedure: Dennison Mascot;  Surgeon: Leon Rigger, MD;  Location: [name];  Service: Gastroenterology;;    SOCIAL HISTORY: Social History   Socioeconomic History   Marital status: Single    Spouse name: Not on file   Number of children: 0   Years of education: Not on file   Highest education level: Some college, no degree  Occupational History   Not on file  Tobacco  Use   Smoking status: Some Days    Types: Cigarettes   Smokeless  tobacco: Never  Vaping Use   Vaping status: Never Used  Substance and Sexual Activity   Alcohol use: Yes    Comment: social   Drug use: No   Sexual activity: Never  Other Topics Concern   Not on file  Social History Narrative   Not on file   Social Determinants of Health   Financial Resource Strain: Low Risk  (04/14/2017)   Overall Financial Resource Strain (CARDIA)    Difficulty of Paying Living Expenses: Not hard at all  Food Insecurity: No Food Insecurity (11/07/2021)   Hunger Vital Sign    Worried About Running Out of Food in the Last Year: Never true    Ran Out of Food in the Last Year: Never true  Transportation Needs: No Transportation Needs (11/07/2021)   PRAPARE - Administrator, Civil Service (Medical): No    Lack of Transportation (Non-Medical): No  Physical Activity: Inactive (04/14/2017)   Exercise Vital Sign    Days of Exercise per Week: 0 days    Minutes of Exercise per Session: 0 min  Stress: Stress Concern Present (04/14/2017)   [name] of Occupational Health - Occupational Stress Questionnaire    Feeling of Stress : Rather much  Social Connections: Unknown (06/19/2021)   Received from [facility]   Social Network    Social Network: Not on file  Intimate Partner Violence: Not At Risk (11/07/2021)   Humiliation, Afraid, Rape, and Kick questionnaire    Fear of Current or Ex-Partner: No    Emotionally Abused: No    Physically Abused: No    Sexually Abused: No    FAMILY HISTORY: Family History  Problem Relation Age of Onset   Drug abuse Father    ALLERGIES:  is allergic to shellfish allergy and other.  MEDICATIONS:  Current Outpatient Medications  Medication Sig Dispense Refill   acetaminophen (TYLENOL) 500 MG tablet Take 1,000 mg by mouth every 6 (six) hours as needed for mild pain.     albuterol (VENTOLIN HFA) 108 (90 Base) MCG/ACT inhaler INHALE 2 PUFFS BY MOUTH EVERY 4 HOURS AS NEEDED FOR WHEEZING FOR SHORTNESS OF BREATH 18 g 0    dexamethasone (DECADRON) 4 MG tablet Take 2 tablets (8 mg total) by mouth daily. Start the day after chemotherapy for 2 days. Take with food. 8 tablet 5   diclofenac Sodium (VOLTAREN) 1 % GEL Apply 4 g topically 4 (four) times daily. (Patient taking differently: Apply 4 g topically 4 (four) times daily as needed (pain).) 100 g 0   lidocaine-prilocaine (EMLA) cream Apply to affected area once 30 g 3   loperamide (IMODIUM) 2 MG capsule Take 2 tabs by mouth with first loose stool, then 1 tab with each additional loose stool as needed. Do not exceed 8 tabs in a 24-hour period 30 capsule 0   methocarbamol (ROBAXIN) 500 MG tablet Take 1 tablet (500 mg total) by mouth 2 (two) times daily. 20 tablet 0   ondansetron (ZOFRAN) 8 MG tablet Take 1 tablet (8 mg total) by mouth every 8 (eight) hours as needed for nausea, vomiting or refractory nausea / vomiting. Start on the third day after chemotherapy. 30 tablet 1   oxyCODONE (OXY IR/ROXICODONE) 5 MG immediate release tablet Take 1 tablet (5 mg total) by mouth every 6 (six) hours as needed for severe pain or moderate pain. 20 tablet 0   polyethylene glycol (  MIRALAX / GLYCOLAX) 17 g packet Take 17 g by mouth daily. (Patient taking differently: Take 17 g by mouth daily as needed for mild constipation or moderate constipation.) 14 each 0   prochlorperazine (COMPAZINE) 10 MG tablet Take 1 tablet (10 mg total) by mouth every 6 (six) hours as needed for nausea or vomiting. 30 tablet 1   senna-docusate (SENOKOT-S) 8.6-50 MG tablet Take 2 tablets by mouth 2 (two) times daily. (Patient taking differently: Take 2 tablets by mouth daily as needed for mild constipation or moderate constipation.) 30 tablet 0   No current facility-administered medications for this visit.    REVIEW OF SYSTEMS:    10 Point review of Systems was done is negative except as noted above.  PHYSICAL EXAMINATION: ECOG PERFORMANCE STATUS: 1 - Symptomatic but completely ambulatory Vitals:    09/11/22 1146  BP: (!) 132/90  Pulse: 90  Resp: 17  Temp: 97.8 F (36.6 C)  SpO2: 100%    GENERAL:alert, in no acute distress and comfortable SKIN: no acute rashes, no significant lesions EYES: conjunctiva are pink and non-injected, sclera anicteric NECK: supple, no JVD LUNGS: clear to auscultation b/l with normal respiratory effort HEART: regular rate & rhythm Extremity: no pedal edema PSYCH: alert & oriented x 3 with fluent speech NEURO: no focal motor/sensory deficits  LABORATORY DATA:  I have reviewed the data as listed  .    Latest Ref Rng & Units 09/11/2022   11:20 AM 08/28/2022    1:06 PM 08/13/2022   11:31 AM  CBC  WBC 4.0 - 10.5 K/uL 4.1  3.7  4.7   Hemoglobin 13.0 - 17.0 g/dL 01.0  27.2  53.6   Hematocrit 39.0 - 52.0 % 32.5  34.7  33.9   Platelets 150 - 400 K/uL 144  141  196     .    Latest Ref Rng & Units 08/28/2022    1:06 PM 08/13/2022   11:31 AM 07/31/2022    8:54 AM  CMP  Glucose 70 - 99 mg/dL 644  99  034   BUN 6 - 20 mg/dL 11  11  12    Creatinine 0.61 - 1.24 mg/dL 7.42  5.95  6.38   Sodium 135 - 145 mmol/L 139  140  138   Potassium 3.5 - 5.1 mmol/L 3.4  3.7  3.9   Chloride 98 - 111 mmol/L 107  108  105   CO2 22 - 32 mmol/L 26  26  27    Calcium 8.9 - 10.3 mg/dL 9.4  9.4  9.7   Total Protein 6.5 - 8.1 g/dL 6.9  7.3  7.8   Total Bilirubin 0.3 - 1.2 mg/dL 0.6  0.5  0.9   Alkaline Phos 38 - 126 U/L 170  374  498   AST 15 - 41 U/L 26  49  134   ALT 0 - 44 U/L 32  132  236    Cytology done 07/13/2021 revealed "FINAL MICROSCOPIC DIAGNOSIS:  A. PANCREAS, HEAD, FINE NEEDLE ASPIRATION:  - Malignant cells present  - Poorly differentiated/high-grade neuroendocrine carcinoma (see  comment)   B. CBD STRICTURE, BRUSHING:  - Atypical cells suspicious for tumor   C. BILIARY DILATION, BALLOON, REMOVAL:  - Atypical cells suspicious for tumor "  Surgical pathology done 07/13/2021 revealed "FINAL MICROSCOPIC DIAGNOSIS:   A. STOMACH, BIOPSY:  Reactive  gastropathy and minimal chronic gastritis with lymphoid  aggregate  Negative for H. pylori, intestinal metaplasia, dysplasia and carcinoma"  RADIOGRAPHIC STUDIES: I  have personally reviewed the radiological images as listed and agreed with the findings in the report. No results found.   ASSESSMENT & PLAN:  [facility]. Is a 32 y.o. male who presents for a follow up for high grade neuroendocrine carcinoma.   1. 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 -Cytology done 07/13/2021 shows presence of malignant cells in pancreas and poorly differentiated/high grade neuroendocrine carcinoma. -Received 6 cycles of first line chemotherapy with carboplatin and etoposide from 08/01/2021-12/26/2021.Treatment was discontinued after CT CAP from 04/04/2022 showed progression of disease. -Received 6 cycles of second line chemotherapy with FOLFOX from 04/24/2022-07/17/2022. Oxaliplatin discontinued with 6th cycle due to elevated LFTs. -Started third line chemotherapy with FOLFIRI on 07/31/2022 Plan -Due for Cycle 4, Day 1 of FOLFIRI today -Labs from today were reviewed with patient. WBC 4.1, Hgb 11.2, Plt 144K. LFTs and creatinine are normal -Proceed with treatment today without any dose modifications.  -RTC in 2 weeks for labs, follow up before Cycle 5. Day 1    All of the patient's questions were answered with apparent satisfaction. The patient knows to call the clinic with any problems, questions or concerns.  I have spent a total of 30 minutes minutes of face-to-face and non-face-to-face time, preparing to see the patient, , performing a medically appropriate examination, counseling and educating the patient,documenting clinical information in the electronic health record, and care coordination.   Georga Kaufmann PA-C Dept of Hematology and Oncology [facility] Phone: [phone]

## 2022-09-13 ENCOUNTER — Inpatient Hospital Stay: Payer: 59

## 2022-09-13 ENCOUNTER — Other Ambulatory Visit: Payer: Self-pay

## 2022-09-13 VITALS — BP 130/89 | HR 97 | Temp 99.1°F | Resp 18

## 2022-09-13 DIAGNOSIS — Z7189 Other specified counseling: Secondary | ICD-10-CM

## 2022-09-13 DIAGNOSIS — C7A1 Malignant poorly differentiated neuroendocrine tumors: Secondary | ICD-10-CM

## 2022-09-13 DIAGNOSIS — Z95828 Presence of other vascular implants and grafts: Secondary | ICD-10-CM

## 2022-09-13 DIAGNOSIS — Z5111 Encounter for antineoplastic chemotherapy: Secondary | ICD-10-CM | POA: Diagnosis not present

## 2022-09-13 MED ORDER — SODIUM CHLORIDE 0.9% FLUSH
10.0000 mL | Freq: Once | INTRAVENOUS | Status: AC
  Administered 2022-09-13: 10 mL

## 2022-09-13 MED ORDER — HEPARIN SOD (PORK) LOCK FLUSH 100 UNIT/ML IV SOLN
500.0000 [IU] | Freq: Once | INTRAVENOUS | Status: AC
  Administered 2022-09-13: 500 [IU]

## 2022-09-25 ENCOUNTER — Inpatient Hospital Stay (HOSPITAL_BASED_OUTPATIENT_CLINIC_OR_DEPARTMENT_OTHER): Payer: 59 | Admitting: Hematology

## 2022-09-25 ENCOUNTER — Other Ambulatory Visit: Payer: Self-pay

## 2022-09-25 ENCOUNTER — Inpatient Hospital Stay: Payer: 59

## 2022-09-25 DIAGNOSIS — C7A1 Malignant poorly differentiated neuroendocrine tumors: Secondary | ICD-10-CM

## 2022-09-25 DIAGNOSIS — Z7189 Other specified counseling: Secondary | ICD-10-CM

## 2022-09-25 DIAGNOSIS — Z95828 Presence of other vascular implants and grafts: Secondary | ICD-10-CM

## 2022-09-25 DIAGNOSIS — Z5111 Encounter for antineoplastic chemotherapy: Secondary | ICD-10-CM | POA: Diagnosis not present

## 2022-09-25 LAB — CBC WITH DIFFERENTIAL (CANCER CENTER ONLY)
Abs Immature Granulocytes: 0.01 10*3/uL (ref 0.00–0.07)
Basophils Absolute: 0 10*3/uL (ref 0.0–0.1)
Basophils Relative: 0 %
Eosinophils Absolute: 0.1 10*3/uL (ref 0.0–0.5)
Eosinophils Relative: 4 %
HCT: 33.2 % — ABNORMAL LOW (ref 39.0–52.0)
Hemoglobin: 11.2 g/dL — ABNORMAL LOW (ref 13.0–17.0)
Immature Granulocytes: 0 %
Lymphocytes Relative: 21 %
Lymphs Abs: 0.7 10*3/uL (ref 0.7–4.0)
MCH: 30.3 pg (ref 26.0–34.0)
MCHC: 33.7 g/dL (ref 30.0–36.0)
MCV: 89.7 fL (ref 80.0–100.0)
Monocytes Absolute: 0.4 10*3/uL (ref 0.1–1.0)
Monocytes Relative: 11 %
Neutro Abs: 2.2 10*3/uL (ref 1.7–7.7)
Neutrophils Relative %: 64 %
Platelet Count: 146 10*3/uL — ABNORMAL LOW (ref 150–400)
RBC: 3.7 MIL/uL — ABNORMAL LOW (ref 4.22–5.81)
RDW: 14.4 % (ref 11.5–15.5)
WBC Count: 3.4 10*3/uL — ABNORMAL LOW (ref 4.0–10.5)
nRBC: 0 % (ref 0.0–0.2)

## 2022-09-25 LAB — CMP (CANCER CENTER ONLY)
ALT: 23 U/L (ref 0–44)
AST: 22 U/L (ref 15–41)
Albumin: 3.9 g/dL (ref 3.5–5.0)
Alkaline Phosphatase: 93 U/L (ref 38–126)
Anion gap: 4 — ABNORMAL LOW (ref 5–15)
BUN: 12 mg/dL (ref 6–20)
CO2: 28 mmol/L (ref 22–32)
Calcium: 9.2 mg/dL (ref 8.9–10.3)
Chloride: 106 mmol/L (ref 98–111)
Creatinine: 0.74 mg/dL (ref 0.61–1.24)
GFR, Estimated: >60 mL/min (ref >60)
Glucose, Bld: 117 mg/dL — ABNORMAL HIGH (ref 70–99)
Potassium: 3.7 mmol/L (ref 3.5–5.1)
Sodium: 138 mmol/L (ref 135–145)
Total Bilirubin: 0.6 mg/dL (ref 0.3–1.2)
Total Protein: 7.1 g/dL (ref 6.5–8.1)

## 2022-09-25 MED ORDER — SODIUM CHLORIDE 0.9 % IV SOLN
400.0000 mg/m2 | Freq: Once | INTRAVENOUS | Status: AC
  Administered 2022-09-25: 1020 mg via INTRAVENOUS
  Filled 2022-09-25: qty 50

## 2022-09-25 MED ORDER — DEXAMETHASONE 4 MG PO TABS
8.0000 mg | ORAL_TABLET | Freq: Every day | ORAL | 5 refills | Status: DC
Start: 2022-09-25 — End: 2023-06-20

## 2022-09-25 MED ORDER — SODIUM CHLORIDE 0.9 % IV SOLN
2400.0000 mg/m2 | INTRAVENOUS | Status: DC
  Administered 2022-09-25: 6100 mg via INTRAVENOUS
  Filled 2022-09-25: qty 122

## 2022-09-25 MED ORDER — HEPARIN SOD (PORK) LOCK FLUSH 100 UNIT/ML IV SOLN: 500.0000 [IU] | Freq: Once | INTRAVENOUS | Status: DC | PRN

## 2022-09-25 MED ORDER — DEXAMETHASONE 4 MG PO TABS
8.0000 mg | ORAL_TABLET | Freq: Every day | ORAL | 5 refills | Status: DC
Start: 2022-09-25 — End: 2022-09-25

## 2022-09-25 MED ORDER — SODIUM CHLORIDE 0.9% FLUSH
10.0000 mL | INTRAVENOUS | Status: DC | PRN
  Administered 2022-09-25: 10 mL

## 2022-09-25 MED ORDER — SODIUM CHLORIDE 0.9 % IV SOLN: Freq: Once | INTRAVENOUS | Status: AC

## 2022-09-25 MED ORDER — SODIUM CHLORIDE 0.9 % IV SOLN
180.0000 mg/m2 | Freq: Once | INTRAVENOUS | Status: AC
  Administered 2022-09-25: 460 mg via INTRAVENOUS
  Filled 2022-09-25: qty 23

## 2022-09-25 MED ORDER — SODIUM CHLORIDE 0.9 % IV SOLN
10.0000 mg | Freq: Once | INTRAVENOUS | Status: AC
  Administered 2022-09-25: 10 mg via INTRAVENOUS
  Filled 2022-09-25: qty 10

## 2022-09-25 MED ORDER — ALBUTEROL SULFATE HFA 108 (90 BASE) MCG/ACT IN AERS
1.0000 | INHALATION_SPRAY | Freq: Four times a day (QID) | RESPIRATORY_TRACT | 0 refills | Status: DC | PRN
Start: 2022-09-25 — End: 2023-01-23

## 2022-09-25 MED ORDER — FLUOROURACIL CHEMO INJECTION 2.5 GM/50ML
400.0000 mg/m2 | Freq: Once | INTRAVENOUS | Status: AC
  Administered 2022-09-25: 1000 mg via INTRAVENOUS
  Filled 2022-09-25: qty 20

## 2022-09-25 MED ORDER — ATROPINE SULFATE 1 MG/ML IV SOLN: 0.5000 mg | Freq: Once | INTRAVENOUS | Status: DC | PRN

## 2022-09-25 MED ORDER — SODIUM CHLORIDE 0.9% FLUSH
10.0000 mL | Freq: Once | INTRAVENOUS | Status: AC
  Administered 2022-09-25: 10 mL

## 2022-09-25 MED ORDER — PALONOSETRON HCL INJECTION 0.25 MG/5ML
0.2500 mg | Freq: Once | INTRAVENOUS | Status: AC
  Administered 2022-09-25: 0.25 mg via INTRAVENOUS
  Filled 2022-09-25: qty 5

## 2022-09-25 NOTE — Progress Notes (Signed)
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  HISTORY OF PRESENTING ILLNESS:  Leon Fischer. is a wonderful 32 y.o. 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 He presents today accompanied by his mother. He reports He is doing well.  He reports no color change in stool.  He notes that he is eating well but feels food just sits in stomach.  We discussed getting additional scans and labs for further evaluation and treatment which he was agreeable to. We further discussed getting MRI of brain and PET scan which he was agreeable to.  He notes that he is trying to hold it together mentally and is still trying to process his new diagnosis. We discussed the options of psychological or spiritual counseling which he has not decided on at this time. He was advised that may call back if he decides that he would like to pursue either.  We discussed getting a port a cath installed which he was agreeable to.  He recently had a biliary stent put into common bile duct.   No abdominal pain or change in bowel habits. No new or unexpected weight loss. No other new or acute focal symptoms.  We discussed his recent stomach biopsy done 07/13/2021 which sowed gastropathy and minor chronic gastritis with lymphoid aggregate. Negative for H. Pylori.  We discussed recent cytology done 07/13/2021 which showed presence of malignant cells in pancreas and poorly differentiated/high grade neuroendocrine carcinoma.  We discussed upper endoscopy done 07/13/2021 revealed mass in pancreatic head and  some enlarged lymph nodes were seen in the celiac and peripancreatic regions.  Labs done today were reviewed in detail.  INTERVAL HISTORY:  Leon Fischer. Is a 32 y.o. 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 He is here for cycle 5 day 1 of Folifiri.   Patient was last seen by me on 08/13/2022 and was doing well overall with no new medical concerns.   He was most recently seen by Thayil, PA on 09/11/2022 and was doing well overall.  Today, he is here for cycle 5 day 1 of Folfiri treatment. He is accompanied by his mother during this visit. Patient tolerated his last cycle of Folfiri well without any new or severe toxicities.   He denies any significant fatigue, diarrhea, leg swelling, tingling, skin rashes, abdominal pain, or SOB. Patient notes that his weight has increased recently. He denies any issues with his port-a-cath.   MEDICAL HISTORY:  Past Medical History:  Diagnosis Date   Asthma    Neuroendocrine cancer (HCC)     SURGICAL HISTORY: Past Surgical History:  Procedure Laterality Date   BILIARY DILATION  07/13/2021   Procedure: BILIARY DILATION;  Surgeon: Vida Rigger, MD;  Location: [facility];  Service: Gastroenterology;;   BILIARY STENT PLACEMENT N/A 07/13/2021   Procedure: BILIARY STENT PLACEMENT;  Surgeon: Vida Rigger, MD;  Location: [name];  Service: Gastroenterology;  Laterality: N/A;   BILIARY STENT PLACEMENT N/A 11/05/2021   Procedure: BILIARY STENT PLACEMENT;  Surgeon: Vida Rigger, MD;  Location: [name];  Service: Gastroenterology;  Laterality: N/A;   BIOPSY  07/13/2021   Procedure: BIOPSY;  Surgeon: Lemar Lofty., MD;  Location: [facility];  Service: Gastroenterology;;   CHOLECYSTECTOMY N/A 11/07/2021   Procedure: LAPAROSCOPIC CHOLECYSTECTOMY;  Surgeon: Berna Bue, MD;  Location: [facility];  Service: General;  Laterality: N/A;   ENDOSCOPIC RETROGRADE  CHOLANGIOPANCREATOGRAPHY (ERCP) WITH PROPOFOL N/A 07/13/2021   Procedure: ENDOSCOPIC RETROGRADE CHOLANGIOPANCREATOGRAPHY (ERCP) WITH PROPOFOL;  Surgeon: Vida Rigger, MD;  Location: [name];  Service: Gastroenterology;  Laterality: N/A;   ERCP N/A 11/05/2021   Procedure: ENDOSCOPIC RETROGRADE CHOLANGIOPANCREATOGRAPHY (ERCP);  Surgeon: Vida Rigger, MD;  Location: [facility];  Service: Gastroenterology;  Laterality: N/A;   ESOPHAGOGASTRODUODENOSCOPY (EGD) WITH PROPOFOL N/A 07/13/2021   Procedure: ESOPHAGOGASTRODUODENOSCOPY (EGD) WITH PROPOFOL;  Surgeon: Meridee Score Netty Starring., MD;  Location: [name];  Service: Gastroenterology;  Laterality: N/A;   EUS N/A 07/13/2021   Procedure: UPPER ENDOSCOPIC ULTRASOUND (EUS) LINEAR;  Surgeon: Lemar Lofty., MD;  Location: [name];  Service: Gastroenterology;  Laterality: N/A;   FINE NEEDLE ASPIRATION  07/13/2021   Procedure: FINE NEEDLE ASPIRATION (FNA) LINEAR;  Surgeon: Lemar Lofty., MD;  Location: [facility];  Service: Gastroenterology;;   IR IMAGING GUIDED PORT INSERTION  07/31/2021   SPHINCTEROTOMY  07/13/2021   Procedure: Dennison Mascot;  Surgeon: Vida Rigger, MD;  Location: [name];  Service: Gastroenterology;;    SOCIAL HISTORY: Social History   Socioeconomic History   Marital status: Single    Spouse name: Not on file   Number of children: 0   Years of education: Not on file   Highest education level: Some college, no degree  Occupational History   Not on file  Tobacco Use   Smoking status: Some Days    Types: Cigarettes   Smokeless tobacco: Never  Vaping Use   Vaping status: Never Used  Substance and Sexual Activity   Alcohol use: Yes    Comment: social   Drug use: No   Sexual activity: Never  Other Topics Concern   Not on file  Social History Narrative   Not on file   Social Determinants of Health   Financial Resource Strain: Low Risk  (04/14/2017)   Overall Financial Resource Strain (CARDIA)     Difficulty of Paying Living Expenses: Not hard at all  Food Insecurity: No Food Insecurity (11/07/2021)   Hunger Vital Sign    Worried About Running Out of Food in the Last Year: Never true    Ran Out of Food in the Last Year: Never true  Transportation Needs: No Transportation Needs (11/07/2021)   PRAPARE - Administrator, Civil Service (Medical): No    Lack of Transportation (Non-Medical): No  Physical Activity: Inactive (04/14/2017)   Exercise Vital Sign    Days of Exercise per Week: 0 days    Minutes of Exercise per Session: 0 min  Stress: Stress Concern Present (04/14/2017)   [name] of Occupational Health - Occupational Stress Questionnaire    Feeling of Stress : Rather much  Social Connections: Unknown (06/19/2021)   Received from [facility]   Social Network    Social Network: Not on file  Intimate Partner Violence: Not At Risk (11/07/2021)   Humiliation, Afraid, Rape, and Kick questionnaire    Fear of Current or Ex-Partner: No    Emotionally Abused: No    Physically Abused: No    Sexually Abused: No    FAMILY HISTORY: Family History  Problem Relation Age of Onset   Drug abuse Father    ALLERGIES:  is allergic to shellfish allergy and other.  MEDICATIONS:  Current Outpatient Medications  Medication Sig Dispense Refill   acetaminophen (TYLENOL) 500 MG tablet Take 1,000 mg by mouth every 6 (six) hours as needed for mild pain.     albuterol (VENTOLIN HFA) 108 (90 Base) MCG/ACT inhaler INHALE 2 PUFFS BY MOUTH EVERY 4 HOURS AS NEEDED FOR WHEEZING FOR SHORTNESS OF BREATH 18 g 0   dexamethasone (DECADRON) 4 MG tablet Take 2 tablets (8 mg total) by mouth daily. Start the day after chemotherapy for 2 days. Take with food. 8 tablet 5   diclofenac Sodium (VOLTAREN) 1 % GEL Apply 4 g topically 4 (four) times daily. (Patient taking differently: Apply 4 g topically 4 (four) times daily as needed (pain).) 100 g 0   lidocaine-prilocaine (EMLA) cream Apply to  affected area once 30 g 3   loperamide (IMODIUM) 2 MG capsule Take 2 tabs by mouth with first loose stool, then 1 tab with each additional loose stool as needed. Do not exceed 8 tabs in a 24-hour period 30 capsule 0   methocarbamol (ROBAXIN) 500 MG tablet Take 1 tablet (500 mg total) by mouth 2 (two) times daily. 20 tablet 0   ondansetron (ZOFRAN) 8 MG tablet Take 1 tablet (8 mg total) by mouth every 8 (eight) hours as needed for nausea, vomiting or refractory nausea / vomiting. Start on the third day after chemotherapy. 30 tablet 1   oxyCODONE (OXY IR/ROXICODONE) 5 MG immediate release tablet Take 1 tablet (5 mg total) by mouth every 6 (six) hours as needed for severe pain or moderate pain. 20 tablet 0   polyethylene glycol (MIRALAX / GLYCOLAX) 17 g packet Take 17 g by mouth daily. (Patient taking differently: Take 17 g by mouth daily as needed for mild constipation or moderate constipation.) 14 each 0   prochlorperazine (COMPAZINE) 10 MG tablet Take 1 tablet (10 mg total) by mouth every 6 (six) hours as needed for nausea or vomiting. 30 tablet 1   senna-docusate (SENOKOT-S) 8.6-50 MG tablet Take 2 tablets by mouth 2 (two) times daily. (Patient taking differently: Take 2 tablets by mouth daily as needed for mild constipation or moderate constipation.) 30 tablet 0   No current facility-administered medications for this visit.    REVIEW OF SYSTEMS:    10 Point review of Systems was done is negative except as noted above.   PHYSICAL EXAMINATION: ECOG PERFORMANCE STATUS: 1 - Symptomatic but completely ambulatory   GENERAL:alert, in no acute distress and comfortable SKIN: no acute rashes, no significant lesions EYES: conjunctiva are pink and non-injected, sclera anicteric OROPHARYNX: MMM, no exudates, no oropharyngeal erythema or ulceration NECK: supple, no JVD LYMPH:  no palpable lymphadenopathy in the cervical, axillary or inguinal regions LUNGS: clear to auscultation b/l with normal  respiratory effort HEART: regular rate & rhythm ABDOMEN:  normoactive bowel sounds , non tender, not distended. Extremity: no pedal edema PSYCH: alert & oriented x 3 with fluent speech NEURO: no focal motor/sensory deficits   LABORATORY DATA:  I have reviewed the data as listed    Latest Ref Rng & Units 09/25/2022    9:22 AM 09/11/2022   11:20 AM 08/28/2022    1:06 PM  CBC  WBC 4.0 - 10.5 K/uL 3.4  4.1  3.7   Hemoglobin 13.0 - 17.0 g/dL 40.9  81.1  91.4   Hematocrit 39.0 - 52.0 % 33.2  32.5  34.7   Platelets 150 - 400 K/uL 146  144  141       Latest  Ref Rng & Units 09/25/2022    9:22 AM 09/11/2022   11:20 AM 08/28/2022    1:06 PM  CMP  Glucose 70 - 99 mg/dL 409  811  914   BUN 6 - 20 mg/dL 12  9  11    Creatinine 0.61 - 1.24 mg/dL 7.82  9.56  2.13   Sodium 135 - 145 mmol/L 138  140  139   Potassium 3.5 - 5.1 mmol/L 3.7  3.6  3.4   Chloride 98 - 111 mmol/L 106  108  107   CO2 22 - 32 mmol/L 28  27  26    Calcium 8.9 - 10.3 mg/dL 9.2  9.0  9.4   Total Protein 6.5 - 8.1 g/dL 7.1  6.9  6.9   Total Bilirubin 0.3 - 1.2 mg/dL 0.6  0.5  0.6   Alkaline Phos 38 - 126 U/L 93  104  170   AST 15 - 41 U/L 22  21  26    ALT 0 - 44 U/L 23  24  32    Cytology done 07/13/2021 revealed "FINAL MICROSCOPIC DIAGNOSIS:  A. PANCREAS, HEAD, FINE NEEDLE ASPIRATION:  - Malignant cells present  - Poorly differentiated/high-grade neuroendocrine carcinoma (see  comment)   B. CBD STRICTURE, BRUSHING:  - Atypical cells suspicious for tumor   C. BILIARY DILATION, BALLOON, REMOVAL:  - Atypical cells suspicious for tumor "  Surgical pathology done 07/13/2021 revealed "FINAL MICROSCOPIC DIAGNOSIS:   A. STOMACH, BIOPSY:  Reactive gastropathy and minimal chronic gastritis with lymphoid  aggregate  Negative for H. pylori, intestinal metaplasia, dysplasia and carcinoma"   Pathology 11/07/21: FINAL MICROSCOPIC DIAGNOSIS:   A. GALLBLADDER, CHOLECYSTECTOMY:  - Acute cholecystitis with necrosis and abscess.    RADIOGRAPHIC STUDIES: I have personally reviewed the radiological images as listed and agreed with the findings in the report. No results found.   ASSESSMENT & PLAN:   31 y.o. very pleasant male with  1. 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 -Cytology done 07/13/2021 shows presence of malignant cells in pancreas and poorly differentiated/high grade neuroendocrine carcinoma.  PLAN:  -Discussed lab results on 09/25/2022 in detail with patient. CBC stable, showed WBC of 3.4K, hemoglobin of 11.2, and platelets of 146K. -CMP stable, liver enzymes improved -continue current supportive medications -Patient tolerated the last cycle of his Folfiri treatment without any new or notable toxicities.  -Patient can proceed with cycle 5 of Folfiri without any dose modification.   -will plan for repeat CT scan for restaging after cycle 6  -will refill steroids and albuterol  FOLLOW-UP: Per integrated scheduling  The total time spent in the appointment was 30 minutes* .  All of the patient's questions were answered with apparent satisfaction. The patient knows to call the clinic with any problems, questions or concerns.   Wyvonnia Lora MD MS AAHIVMS [facility] Hematology/Oncology Physician [facility]  .*Total Encounter Time as defined by the Centers for Medicare and Medicaid Services includes, in addition to the face-to-face time of a patient visit (documented in the note above) non-face-to-face time: obtaining and reviewing outside history, ordering and reviewing medications, tests or procedures, care coordination (communications with other health care professionals or caregivers) and documentation in the medical record.    I,Mitra Faeizi,acting as a Neurosurgeon for Wyvonnia Lora, MD.,have documented all relevant documentation on the behalf of Wyvonnia Lora, MD,as directed by  Wyvonnia Lora, MD while in  the presence  of Wyvonnia Lora, MD.  .I have reviewed the above documentation for accuracy and completeness, and I agree with the above. [name] MD

## 2022-09-25 NOTE — Patient Instructions (Signed)
 [place] [facility]  Discharge Instructions: Thank you for choosing [facility] to provide your oncology and hematology care.   If you have a lab appointment with the Cancer Center, please go directly to the Cancer Center and check in at the registration area.   Wear comfortable clothing and clothing appropriate for easy access to any Portacath or PICC line.   We strive to give you quality time with your provider. You may need to reschedule your appointment if you arrive late (15 or more minutes).  Arriving late affects you and other patients whose appointments are after yours.  Also, if you miss three or more appointments without notifying the office, you may be dismissed from the clinic at the provider's discretion.      For prescription refill requests, have your pharmacy contact our office and allow 72 hours for refills to be completed.    Today you received the following chemotherapy and/or immunotherapy agents: Irinotecan/Leucovorin/Fluorouracil       To help prevent nausea and vomiting after your treatment, we encourage you to take your nausea medication as directed.  BELOW ARE SYMPTOMS THAT SHOULD BE REPORTED IMMEDIATELY: *FEVER GREATER THAN 100.4 F (38 C) OR HIGHER *CHILLS OR SWEATING *NAUSEA AND VOMITING THAT IS NOT CONTROLLED WITH YOUR NAUSEA MEDICATION *UNUSUAL SHORTNESS OF BREATH *UNUSUAL BRUISING OR BLEEDING *URINARY PROBLEMS (pain or burning when urinating, or frequent urination) *BOWEL PROBLEMS (unusual diarrhea, constipation, pain near the anus) TENDERNESS IN MOUTH AND THROAT WITH OR WITHOUT PRESENCE OF ULCERS (sore throat, sores in mouth, or a toothache) UNUSUAL RASH, SWELLING OR PAIN  UNUSUAL VAGINAL DISCHARGE OR ITCHING   Items with * indicate a potential emergency and should be followed up as soon as possible or go to the Emergency Department if any problems should occur.  Please show the CHEMOTHERAPY ALERT CARD or  IMMUNOTHERAPY ALERT CARD at check-in to the Emergency Department and triage nurse.  Should you have questions after your visit or need to cancel or reschedule your appointment, please contact [place] [facility]  Dept: [phone]  and follow the prompts.  Office hours are 8:00 a.m. to 4:30 p.m. Monday - Friday. Please note that voicemails left after 4:00 p.m. may not be returned until the following business day.  We are closed weekends and major holidays. You have access to a nurse at all times for urgent questions. Please call the main number to the clinic Dept: [phone] and follow the prompts.   For any non-urgent questions, you may also contact your provider using MyChart. We now offer e-Visits for anyone 59 and older to request care online for non-urgent symptoms. For details visit [email or web address].   Also download the MyChart app! Go to the app store, search "MyChart", open the app, select [place], and log in with your MyChart username and password.  The chemotherapy medication bag should finish at 46 hours, 96 hours, or 7 days. For example, if your pump is scheduled for 46 hours and it was put on at 4:00 p.m., it should finish at 2:00 p.m. the day it is scheduled to come off regardless of your appointment time.     Estimated time to finish at approximately 1 PM on 09/13/2022.   If the display on your pump reads "Low Volume" and it is beeping, take the batteries out of the pump and come to the cancer center for it to be taken off.   If the pump alarms  go off prior to the pump reading "Low Volume" then call [phone] and someone can assist you.  If the plunger comes out and the chemotherapy medication is leaking out, please use your home chemo spill kit to clean up the spill. Do NOT use paper towels or other household products.  If you have problems or questions regarding your pump, please call either [phone] (24 hours a day) or the  cancer center Monday-Friday 8:00 a.m.- 4:30 p.m. at the clinic number and we will assist you. If you are unable to get assistance, then go to the nearest Emergency Department and ask the staff to contact the IV team for assistance.

## 2022-09-25 NOTE — Progress Notes (Signed)
Patient seen by Dr. Kale  Vitals are within treatment parameters.  Labs reviewed: and are within treatment parameters.  Per physician team, patient is ready for treatment and there are NO modifications to the treatment plan.  

## 2022-09-27 ENCOUNTER — Inpatient Hospital Stay: Payer: 59

## 2022-09-27 VITALS — BP 109/72 | HR 85 | Temp 98.8°F | Resp 20

## 2022-09-27 DIAGNOSIS — Z5111 Encounter for antineoplastic chemotherapy: Secondary | ICD-10-CM | POA: Diagnosis not present

## 2022-09-27 DIAGNOSIS — Z7189 Other specified counseling: Secondary | ICD-10-CM

## 2022-09-27 DIAGNOSIS — Z95828 Presence of other vascular implants and grafts: Secondary | ICD-10-CM

## 2022-09-27 DIAGNOSIS — C7A1 Malignant poorly differentiated neuroendocrine tumors: Secondary | ICD-10-CM

## 2022-09-27 MED ORDER — SODIUM CHLORIDE 0.9% FLUSH
10.0000 mL | Freq: Once | INTRAVENOUS | Status: AC
  Administered 2022-09-27: 10 mL

## 2022-09-27 MED ORDER — HEPARIN SOD (PORK) LOCK FLUSH 100 UNIT/ML IV SOLN
500.0000 [IU] | Freq: Once | INTRAVENOUS | Status: AC
  Administered 2022-09-27: 500 [IU]

## 2022-10-01 ENCOUNTER — Encounter: Payer: Self-pay | Admitting: Hematology

## 2022-10-09 ENCOUNTER — Ambulatory Visit: Payer: 59 | Admitting: Hematology

## 2022-10-09 ENCOUNTER — Other Ambulatory Visit: Payer: 59

## 2022-10-09 ENCOUNTER — Inpatient Hospital Stay (HOSPITAL_BASED_OUTPATIENT_CLINIC_OR_DEPARTMENT_OTHER): Payer: 59 | Admitting: Physician Assistant

## 2022-10-09 ENCOUNTER — Inpatient Hospital Stay: Payer: 59

## 2022-10-09 ENCOUNTER — Inpatient Hospital Stay: Payer: 59 | Attending: Hematology

## 2022-10-09 VITALS — BP 135/90 | HR 87 | Temp 98.4°F | Resp 16 | Wt 297.9 lb

## 2022-10-09 DIAGNOSIS — Z7189 Other specified counseling: Secondary | ICD-10-CM

## 2022-10-09 DIAGNOSIS — C7A1 Malignant poorly differentiated neuroendocrine tumors: Secondary | ICD-10-CM

## 2022-10-09 DIAGNOSIS — F17210 Nicotine dependence, cigarettes, uncomplicated: Secondary | ICD-10-CM | POA: Diagnosis not present

## 2022-10-09 DIAGNOSIS — Z5111 Encounter for antineoplastic chemotherapy: Secondary | ICD-10-CM | POA: Insufficient documentation

## 2022-10-09 DIAGNOSIS — C7B8 Other secondary neuroendocrine tumors: Secondary | ICD-10-CM | POA: Insufficient documentation

## 2022-10-09 DIAGNOSIS — Z95828 Presence of other vascular implants and grafts: Secondary | ICD-10-CM

## 2022-10-09 LAB — CMP (CANCER CENTER ONLY)
ALT: 17 U/L (ref 0–44)
AST: 18 U/L (ref 15–41)
Albumin: 3.9 g/dL (ref 3.5–5.0)
Alkaline Phosphatase: 83 U/L (ref 38–126)
Anion gap: 5 (ref 5–15)
BUN: 10 mg/dL (ref 6–20)
CO2: 27 mmol/L (ref 22–32)
Calcium: 9.2 mg/dL (ref 8.9–10.3)
Chloride: 109 mmol/L (ref 98–111)
Creatinine: 0.75 mg/dL (ref 0.61–1.24)
GFR, Estimated: >60 mL/min (ref >60)
Glucose, Bld: 112 mg/dL — ABNORMAL HIGH (ref 70–99)
Potassium: 3.5 mmol/L (ref 3.5–5.1)
Sodium: 141 mmol/L (ref 135–145)
Total Bilirubin: 0.8 mg/dL (ref 0.3–1.2)
Total Protein: 6.9 g/dL (ref 6.5–8.1)

## 2022-10-09 LAB — CBC WITH DIFFERENTIAL (CANCER CENTER ONLY)
Abs Immature Granulocytes: 0 10*3/uL (ref 0.00–0.07)
Basophils Absolute: 0 10*3/uL (ref 0.0–0.1)
Basophils Relative: 0 %
Eosinophils Absolute: 0.1 10*3/uL (ref 0.0–0.5)
Eosinophils Relative: 4 %
HCT: 33.6 % — ABNORMAL LOW (ref 39.0–52.0)
Hemoglobin: 11.2 g/dL — ABNORMAL LOW (ref 13.0–17.0)
Immature Granulocytes: 0 %
Lymphocytes Relative: 17 %
Lymphs Abs: 0.6 10*3/uL — ABNORMAL LOW (ref 0.7–4.0)
MCH: 29.8 pg (ref 26.0–34.0)
MCHC: 33.3 g/dL (ref 30.0–36.0)
MCV: 89.4 fL (ref 80.0–100.0)
Monocytes Absolute: 0.3 10*3/uL (ref 0.1–1.0)
Monocytes Relative: 10 %
Neutro Abs: 2.3 10*3/uL (ref 1.7–7.7)
Neutrophils Relative %: 69 %
Platelet Count: 123 10*3/uL — ABNORMAL LOW (ref 150–400)
RBC: 3.76 MIL/uL — ABNORMAL LOW (ref 4.22–5.81)
RDW: 14.5 % (ref 11.5–15.5)
WBC Count: 3.4 10*3/uL — ABNORMAL LOW (ref 4.0–10.5)
nRBC: 0 % (ref 0.0–0.2)

## 2022-10-09 MED ORDER — SODIUM CHLORIDE 0.9 % IV SOLN
180.0000 mg/m2 | Freq: Once | INTRAVENOUS | Status: AC
  Administered 2022-10-09: 460 mg via INTRAVENOUS
  Filled 2022-10-09: qty 21.63

## 2022-10-09 MED ORDER — SODIUM CHLORIDE 0.9 % IV SOLN
10.0000 mg | Freq: Once | INTRAVENOUS | Status: AC
  Administered 2022-10-09: 10 mg via INTRAVENOUS
  Filled 2022-10-09: qty 10

## 2022-10-09 MED ORDER — PALONOSETRON HCL INJECTION 0.25 MG/5ML
0.2500 mg | Freq: Once | INTRAVENOUS | Status: AC
  Administered 2022-10-09: 0.25 mg via INTRAVENOUS
  Filled 2022-10-09: qty 5

## 2022-10-09 MED ORDER — SODIUM CHLORIDE 0.9% FLUSH
10.0000 mL | Freq: Once | INTRAVENOUS | Status: AC
  Administered 2022-10-09: 10 mL

## 2022-10-09 MED ORDER — SODIUM CHLORIDE 0.9 % IV SOLN
400.0000 mg/m2 | Freq: Once | INTRAVENOUS | Status: AC
  Administered 2022-10-09: 1020 mg via INTRAVENOUS
  Filled 2022-10-09: qty 50

## 2022-10-09 MED ORDER — FLUOROURACIL CHEMO INJECTION 2.5 GM/50ML
400.0000 mg/m2 | Freq: Once | INTRAVENOUS | Status: AC
  Administered 2022-10-09: 1000 mg via INTRAVENOUS
  Filled 2022-10-09: qty 20

## 2022-10-09 MED ORDER — SODIUM CHLORIDE 0.9 % IV SOLN: Freq: Once | INTRAVENOUS | Status: AC

## 2022-10-09 MED ORDER — SODIUM CHLORIDE 0.9 % IV SOLN
2400.0000 mg/m2 | INTRAVENOUS | Status: DC
  Administered 2022-10-09: 6100 mg via INTRAVENOUS
  Filled 2022-10-09: qty 122

## 2022-10-09 NOTE — Patient Instructions (Signed)
 [place] [facility]  Discharge Instructions: Thank you for choosing [facility] to provide your oncology and hematology care.   If you have a lab appointment with the Cancer Center, please go directly to the Cancer Center and check in at the registration area.   Wear comfortable clothing and clothing appropriate for easy access to any Portacath or PICC line.   We strive to give you quality time with your provider. You may need to reschedule your appointment if you arrive late (15 or more minutes).  Arriving late affects you and other patients whose appointments are after yours.  Also, if you miss three or more appointments without notifying the office, you may be dismissed from the clinic at the provider's discretion.      For prescription refill requests, have your pharmacy contact our office and allow 72 hours for refills to be completed.    Today you received the following chemotherapy and/or immunotherapy agents: Irinotecan/Leucovorin/Fluorouracil       To help prevent nausea and vomiting after your treatment, we encourage you to take your nausea medication as directed.  BELOW ARE SYMPTOMS THAT SHOULD BE REPORTED IMMEDIATELY: *FEVER GREATER THAN 100.4 F (38 C) OR HIGHER *CHILLS OR SWEATING *NAUSEA AND VOMITING THAT IS NOT CONTROLLED WITH YOUR NAUSEA MEDICATION *UNUSUAL SHORTNESS OF BREATH *UNUSUAL BRUISING OR BLEEDING *URINARY PROBLEMS (pain or burning when urinating, or frequent urination) *BOWEL PROBLEMS (unusual diarrhea, constipation, pain near the anus) TENDERNESS IN MOUTH AND THROAT WITH OR WITHOUT PRESENCE OF ULCERS (sore throat, sores in mouth, or a toothache) UNUSUAL RASH, SWELLING OR PAIN  UNUSUAL VAGINAL DISCHARGE OR ITCHING   Items with * indicate a potential emergency and should be followed up as soon as possible or go to the Emergency Department if any problems should occur.  Please show the CHEMOTHERAPY ALERT CARD or  IMMUNOTHERAPY ALERT CARD at check-in to the Emergency Department and triage nurse.  Should you have questions after your visit or need to cancel or reschedule your appointment, please contact [place] [facility]  Dept: [phone]  and follow the prompts.  Office hours are 8:00 a.m. to 4:30 p.m. Monday - Friday. Please note that voicemails left after 4:00 p.m. may not be returned until the following business day.  We are closed weekends and major holidays. You have access to a nurse at all times for urgent questions. Please call the main number to the clinic Dept: [phone] and follow the prompts.   For any non-urgent questions, you may also contact your provider using MyChart. We now offer e-Visits for anyone 59 and older to request care online for non-urgent symptoms. For details visit [email or web address].   Also download the MyChart app! Go to the app store, search "MyChart", open the app, select [place], and log in with your MyChart username and password.  The chemotherapy medication bag should finish at 46 hours, 96 hours, or 7 days. For example, if your pump is scheduled for 46 hours and it was put on at 4:00 p.m., it should finish at 2:00 p.m. the day it is scheduled to come off regardless of your appointment time.     Estimated time to finish at approximately 1 PM on 09/13/2022.   If the display on your pump reads "Low Volume" and it is beeping, take the batteries out of the pump and come to the cancer center for it to be taken off.   If the pump alarms  go off prior to the pump reading "Low Volume" then call [phone] and someone can assist you.  If the plunger comes out and the chemotherapy medication is leaking out, please use your home chemo spill kit to clean up the spill. Do NOT use paper towels or other household products.  If you have problems or questions regarding your pump, please call either [phone] (24 hours a day) or the  cancer center Monday-Friday 8:00 a.m.- 4:30 p.m. at the clinic number and we will assist you. If you are unable to get assistance, then go to the nearest Emergency Department and ask the staff to contact the IV team for assistance.

## 2022-10-09 NOTE — Progress Notes (Signed)
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  04/24/2022-07/17/2022: Received 6 cycles of FOLFOX, oxaliplatin discontinued with 6th cycle due to elevated LFTs.   CURRENT TREATMENT: FOLFIRI 07/31/22: Cycle 1 08/13/22: Cycle 2 08/28/22: Cycle 3 09/11/22: Cycle 4 09/25/22: Cycle 5 10/09/22: Cycle 6  INTERVAL HISTORY:  Leon Liv. Is a 32 y.o. male returns today for a follow up for high grade neuroendocrine carcinoma prior to Cycle 6, Day 1 of FOLFIRI chemotherapy.He is accompanied by his mother for this visit.   [name] reports is doing well and tolerating treatment. His energy is unchanged and he continues to complete all his baseline ADLs on his own. He denies any appetite or weight changes. He denies nausea, vomiting or abdominal pain. He denies any bowel habit changes including recurrent episodes of diarrhea or constipation. He denies easy bruising or signs of active bleeding.He denies fevers, chills, night sweats, shortness of breath, chest pain or cough. He has no other complaints.  MEDICAL HISTORY:  Past Medical History:  Diagnosis Date   Asthma    Neuroendocrine cancer (HCC)     SURGICAL HISTORY: Past Surgical History:  Procedure Laterality Date   BILIARY DILATION  07/13/2021   Procedure: BILIARY DILATION;  Surgeon: Vida Rigger, MD;  Location: [facility];  Service: Gastroenterology;;   BILIARY STENT PLACEMENT N/A 07/13/2021   Procedure: BILIARY STENT PLACEMENT;  Surgeon: Vida Rigger, MD;  Location: [name];  Service: Gastroenterology;  Laterality: N/A;   BILIARY STENT  PLACEMENT N/A 11/05/2021   Procedure: BILIARY STENT PLACEMENT;  Surgeon: Vida Rigger, MD;  Location: [name];  Service: Gastroenterology;  Laterality: N/A;   BIOPSY  07/13/2021   Procedure: BIOPSY;  Surgeon: Meridee Score Netty Starring., MD;  Location: [facility];  Service: Gastroenterology;;   CHOLECYSTECTOMY N/A 11/07/2021   Procedure: LAPAROSCOPIC CHOLECYSTECTOMY;  Surgeon: Berna Bue, MD;  Location: [facility];  Service: General;  Laterality: N/A;   ENDOSCOPIC RETROGRADE CHOLANGIOPANCREATOGRAPHY (ERCP) WITH PROPOFOL N/A 07/13/2021   Procedure: ENDOSCOPIC RETROGRADE CHOLANGIOPANCREATOGRAPHY (ERCP) WITH PROPOFOL;  Surgeon: Vida Rigger, MD;  Location: [name];  Service: Gastroenterology;  Laterality: N/A;   ERCP N/A 11/05/2021   Procedure: ENDOSCOPIC RETROGRADE CHOLANGIOPANCREATOGRAPHY (ERCP);  Surgeon: Vida Rigger, MD;  Location: [facility];  Service: Gastroenterology;  Laterality: N/A;   ESOPHAGOGASTRODUODENOSCOPY (EGD) WITH PROPOFOL N/A 07/13/2021   Procedure: ESOPHAGOGASTRODUODENOSCOPY (EGD) WITH PROPOFOL;  Surgeon: Meridee Score Netty Starring., MD;  Location: [name];  Service: Gastroenterology;  Laterality: N/A;   EUS N/A 07/13/2021   Procedure: UPPER ENDOSCOPIC ULTRASOUND (EUS) LINEAR;  Surgeon: Lemar Lofty., MD;  Location: [name];  Service: Gastroenterology;  Laterality: N/A;   FINE NEEDLE ASPIRATION  07/13/2021   Procedure: FINE NEEDLE ASPIRATION (FNA) LINEAR;  Surgeon: Lemar Lofty., MD;  Location: [facility];  Service: Gastroenterology;;   IR IMAGING GUIDED PORT INSERTION  07/31/2021   SPHINCTEROTOMY  07/13/2021   Procedure: Dennison Mascot;  Surgeon: Vida Rigger, MD;  Location: [name];  Service: Gastroenterology;;    SOCIAL HISTORY: Social History   Socioeconomic History   Marital status: Single    Spouse name: Not on file   Number of children: 0   Years of education: Not on file  Highest education level: Some college, no degree  Occupational  History   Not on file  Tobacco Use   Smoking status: Some Days    Types: Cigarettes   Smokeless tobacco: Never  Vaping Use   Vaping status: Never Used  Substance and Sexual Activity   Alcohol use: Yes    Comment: social   Drug use: No   Sexual activity: Never  Other Topics Concern   Not on file  Social History Narrative   Not on file   Social Determinants of Health   Financial Resource Strain: Low Risk  (04/14/2017)   Overall Financial Resource Strain (CARDIA)    Difficulty of Paying Living Expenses: Not hard at all  Food Insecurity: No Food Insecurity (11/07/2021)   Hunger Vital Sign    Worried About Running Out of Food in the Last Year: Never true    Ran Out of Food in the Last Year: Never true  Transportation Needs: No Transportation Needs (11/07/2021)   PRAPARE - Administrator, Civil Service (Medical): No    Lack of Transportation (Non-Medical): No  Physical Activity: Inactive (04/14/2017)   Exercise Vital Sign    Days of Exercise per Week: 0 days    Minutes of Exercise per Session: 0 min  Stress: Stress Concern Present (04/14/2017)   [name] of Occupational Health - Occupational Stress Questionnaire    Feeling of Stress : Rather much  Social Connections: Unknown (06/19/2021)   Received from [facility]   Social Network    Social Network: Not on file  Intimate Partner Violence: Not At Risk (11/07/2021)   Humiliation, Afraid, Rape, and Kick questionnaire    Fear of Current or Ex-Partner: No    Emotionally Abused: No    Physically Abused: No    Sexually Abused: No    FAMILY HISTORY: Family History  Problem Relation Age of Onset   Drug abuse Father    ALLERGIES:  is allergic to shellfish allergy and other.  MEDICATIONS:  Current Outpatient Medications  Medication Sig Dispense Refill   acetaminophen (TYLENOL) 500 MG tablet Take 1,000 mg by mouth every 6 (six) hours as needed for mild pain.     albuterol (VENTOLIN HFA) 108 (90 Base) MCG/ACT  inhaler Inhale 1-2 puffs into the lungs every 6 (six) hours as needed for wheezing or shortness of breath. 18 g 0   dexamethasone (DECADRON) 4 MG tablet Take 2 tablets (8 mg total) by mouth daily. Start the day after chemotherapy for 2 days. Take with food. 8 tablet 5   lidocaine-prilocaine (EMLA) cream Apply to affected area once 30 g 3   loperamide (IMODIUM) 2 MG capsule Take 2 tabs by mouth with first loose stool, then 1 tab with each additional loose stool as needed. Do not exceed 8 tabs in a 24-hour period 30 capsule 0   ondansetron (ZOFRAN) 8 MG tablet Take 1 tablet (8 mg total) by mouth every 8 (eight) hours as needed for nausea, vomiting or refractory nausea / vomiting. Start on the third day after chemotherapy. 30 tablet 1   oxyCODONE (OXY IR/ROXICODONE) 5 MG immediate release tablet Take 1 tablet (5 mg total) by mouth every 6 (six) hours as needed for severe pain or moderate pain. 20 tablet 0   polyethylene glycol (MIRALAX / GLYCOLAX) 17 g packet Take 17 g by mouth daily. 14 each 0   prochlorperazine (COMPAZINE) 10 MG tablet Take 1 tablet (10 mg total) by mouth every 6 (six) hours as needed  for nausea or vomiting. 30 tablet 1   senna-docusate (SENOKOT-S) 8.6-50 MG tablet Take 2 tablets by mouth 2 (two) times daily. 30 tablet 0   diclofenac Sodium (VOLTAREN) 1 % GEL Apply 4 g topically 4 (four) times daily. (Patient not taking: Reported on 09/25/2022) 100 g 0   methocarbamol (ROBAXIN) 500 MG tablet Take 1 tablet (500 mg total) by mouth 2 (two) times daily. (Patient not taking: Reported on 09/25/2022) 20 tablet 0   No current facility-administered medications for this visit.    REVIEW OF SYSTEMS:    10 Point review of Systems was done is negative except as noted above.  PHYSICAL EXAMINATION: ECOG PERFORMANCE STATUS: 1 - Symptomatic but completely ambulatory Vitals:   10/09/22 1156  BP: (!) 135/90  Pulse: 87  Resp: 16  Temp: 98.4 F (36.9 C)  SpO2: 100%    GENERAL:alert, in no  acute distress and comfortable SKIN: no acute rashes, no significant lesions EYES: conjunctiva are pink and non-injected, sclera anicteric NECK: supple, no JVD LUNGS: clear to auscultation b/l with normal respiratory effort HEART: regular rate & rhythm Extremity: no pedal edema PSYCH: alert & oriented x 3 with fluent speech NEURO: no focal motor/sensory deficits  LABORATORY DATA:  I have reviewed the data as listed  .    Latest Ref Rng & Units 10/09/2022   11:31 AM 09/25/2022    9:22 AM 09/11/2022   11:20 AM  CBC  WBC 4.0 - 10.5 K/uL 3.4  3.4  4.1   Hemoglobin 13.0 - 17.0 g/dL 69.6  29.5  28.4   Hematocrit 39.0 - 52.0 % 33.6  33.2  32.5   Platelets 150 - 400 K/uL 123  146  144     .    Latest Ref Rng & Units 10/09/2022   11:31 AM 09/25/2022    9:22 AM 09/11/2022   11:20 AM  CMP  Glucose 70 - 99 mg/dL 132  440  102   BUN 6 - 20 mg/dL 10  12  9    Creatinine 0.61 - 1.24 mg/dL 7.25  3.66  4.40   Sodium 135 - 145 mmol/L 141  138  140   Potassium 3.5 - 5.1 mmol/L 3.5  3.7  3.6   Chloride 98 - 111 mmol/L 109  106  108   CO2 22 - 32 mmol/L 27  28  27    Calcium 8.9 - 10.3 mg/dL 9.2  9.2  9.0   Total Protein 6.5 - 8.1 g/dL 6.9  7.1  6.9   Total Bilirubin 0.3 - 1.2 mg/dL 0.8  0.6  0.5   Alkaline Phos 38 - 126 U/L 83  93  104   AST 15 - 41 U/L 18  22  21    ALT 0 - 44 U/L 17  23  24     Cytology done 07/13/2021 revealed "FINAL MICROSCOPIC DIAGNOSIS:  A. PANCREAS, HEAD, FINE NEEDLE ASPIRATION:  - Malignant cells present  - Poorly differentiated/high-grade neuroendocrine carcinoma (see  comment)   B. CBD STRICTURE, BRUSHING:  - Atypical cells suspicious for tumor   C. BILIARY DILATION, BALLOON, REMOVAL:  - Atypical cells suspicious for tumor "  Surgical pathology done 07/13/2021 revealed "FINAL MICROSCOPIC DIAGNOSIS:   A. STOMACH, BIOPSY:  Reactive gastropathy and minimal chronic gastritis with lymphoid  aggregate  Negative for H. pylori, intestinal metaplasia, dysplasia and  carcinoma"  RADIOGRAPHIC STUDIES: I have personally reviewed the radiological images as listed and agreed with the findings in the report. No results  found.   ASSESSMENT & PLAN:  Leon Liv. Is a 32 y.o. male who presents for a follow up for high grade neuroendocrine carcinoma.   1. 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 -Cytology done 07/13/2021 shows presence of malignant cells in pancreas and poorly differentiated/high grade neuroendocrine carcinoma. -Received 6 cycles of first line chemotherapy with carboplatin and etoposide from 08/01/2021-12/26/2021.Treatment was discontinued after CT CAP from 04/04/2022 showed progression of disease. -Received 6 cycles of second line chemotherapy with FOLFOX from 04/24/2022-07/17/2022. Oxaliplatin discontinued with 6th cycle due to elevated LFTs. -Started third line chemotherapy with FOLFIRI on 07/31/2022 Plan -Due for Cycle 6, Day 1 of FOLFIRI today -Labs from today were reviewed with patient. WBC 3.4, Hgb 11.2, Plt 123K. LFTs and creatinine are normal -Proceed with treatment today without any dose modifications.  -Plan for repeat CT CAP to assess treatment response next week  -RTC in 2 weeks for labs, follow up before Cycle 7. Day 1    All of the patient's questions were answered with apparent satisfaction. The patient knows to call the clinic with any problems, questions or concerns.  I have spent a total of 30 minutes minutes of face-to-face and non-face-to-face time, preparing to see the patient, , performing a medically appropriate examination, counseling and educating the patient,documenting clinical information in the electronic health record, and care coordination.   Georga Kaufmann PA-C Dept of Hematology and Oncology [facility] at [facility] Phone: [phone]

## 2022-10-11 ENCOUNTER — Other Ambulatory Visit: Payer: 59

## 2022-10-11 ENCOUNTER — Inpatient Hospital Stay: Payer: 59

## 2022-10-11 ENCOUNTER — Ambulatory Visit: Payer: 59 | Admitting: Hematology

## 2022-10-11 ENCOUNTER — Ambulatory Visit: Payer: 59

## 2022-10-11 VITALS — BP 120/83 | HR 76 | Temp 98.4°F | Resp 20

## 2022-10-11 DIAGNOSIS — Z5111 Encounter for antineoplastic chemotherapy: Secondary | ICD-10-CM | POA: Diagnosis not present

## 2022-10-11 DIAGNOSIS — Z7189 Other specified counseling: Secondary | ICD-10-CM

## 2022-10-11 DIAGNOSIS — C7A1 Malignant poorly differentiated neuroendocrine tumors: Secondary | ICD-10-CM

## 2022-10-11 MED ORDER — HEPARIN SOD (PORK) LOCK FLUSH 100 UNIT/ML IV SOLN
500.0000 [IU] | Freq: Once | INTRAVENOUS | Status: AC | PRN
  Administered 2022-10-11: 500 [IU]

## 2022-10-11 MED ORDER — SODIUM CHLORIDE 0.9% FLUSH
10.0000 mL | INTRAVENOUS | Status: DC | PRN
  Administered 2022-10-11: 10 mL

## 2022-10-14 ENCOUNTER — Encounter: Payer: Self-pay | Admitting: Hematology

## 2022-10-17 ENCOUNTER — Ambulatory Visit (HOSPITAL_COMMUNITY)
Admission: RE | Admit: 2022-10-17 | Discharge: 2022-10-17 | Disposition: A | Discharge status: Home / Self Care | Payer: 59 | Source: Ambulatory Visit | Attending: Physician Assistant | Admitting: Physician Assistant

## 2022-10-17 DIAGNOSIS — C7A1 Malignant poorly differentiated neuroendocrine tumors: Secondary | ICD-10-CM | POA: Insufficient documentation

## 2022-10-17 MED ORDER — IOHEXOL 300 MG/ML  SOLN
100.0000 mL | Freq: Once | INTRAMUSCULAR | Status: AC | PRN
  Administered 2022-10-17: 100 mL via INTRAVENOUS

## 2022-10-17 MED ORDER — HEPARIN SOD (PORK) LOCK FLUSH 100 UNIT/ML IV SOLN
500.0000 [IU] | Freq: Once | INTRAVENOUS | Status: AC
  Administered 2022-10-17: 500 [IU] via INTRAVENOUS

## 2022-10-22 MED FILL — Dexamethasone Sodium Phosphate Inj 100 MG/10ML: INTRAMUSCULAR | Qty: 1 | Status: AC

## 2022-10-23 ENCOUNTER — Inpatient Hospital Stay (HOSPITAL_BASED_OUTPATIENT_CLINIC_OR_DEPARTMENT_OTHER): Payer: 59 | Admitting: Hematology

## 2022-10-23 ENCOUNTER — Inpatient Hospital Stay: Payer: 59

## 2022-10-23 VITALS — BP 124/96 | HR 85 | Temp 97.5°F | Resp 20 | Wt 301.7 lb

## 2022-10-23 DIAGNOSIS — C7A1 Malignant poorly differentiated neuroendocrine tumors: Secondary | ICD-10-CM

## 2022-10-23 DIAGNOSIS — Z7189 Other specified counseling: Secondary | ICD-10-CM

## 2022-10-23 DIAGNOSIS — Z95828 Presence of other vascular implants and grafts: Secondary | ICD-10-CM

## 2022-10-23 DIAGNOSIS — Z5111 Encounter for antineoplastic chemotherapy: Secondary | ICD-10-CM | POA: Diagnosis not present

## 2022-10-23 LAB — CMP (CANCER CENTER ONLY)
ALT: 18 U/L (ref 0–44)
AST: 18 U/L (ref 15–41)
Albumin: 4 g/dL (ref 3.5–5.0)
Alkaline Phosphatase: 79 U/L (ref 38–126)
Anion gap: 4 — ABNORMAL LOW (ref 5–15)
BUN: 12 mg/dL (ref 6–20)
CO2: 27 mmol/L (ref 22–32)
Calcium: 9.3 mg/dL (ref 8.9–10.3)
Chloride: 109 mmol/L (ref 98–111)
Creatinine: 0.85 mg/dL (ref 0.61–1.24)
GFR, Estimated: >60 mL/min (ref >60)
Glucose, Bld: 96 mg/dL (ref 70–99)
Potassium: 3.8 mmol/L (ref 3.5–5.1)
Sodium: 140 mmol/L (ref 135–145)
Total Bilirubin: 0.8 mg/dL (ref 0.3–1.2)
Total Protein: 7.1 g/dL (ref 6.5–8.1)

## 2022-10-23 LAB — CBC WITH DIFFERENTIAL (CANCER CENTER ONLY)
Abs Immature Granulocytes: 0 10*3/uL (ref 0.00–0.07)
Basophils Absolute: 0 10*3/uL (ref 0.0–0.1)
Basophils Relative: 0 %
Eosinophils Absolute: 0.1 10*3/uL (ref 0.0–0.5)
Eosinophils Relative: 3 %
HCT: 33.5 % — ABNORMAL LOW (ref 39.0–52.0)
Hemoglobin: 11.1 g/dL — ABNORMAL LOW (ref 13.0–17.0)
Immature Granulocytes: 0 %
Lymphocytes Relative: 18 %
Lymphs Abs: 0.7 10*3/uL (ref 0.7–4.0)
MCH: 29.8 pg (ref 26.0–34.0)
MCHC: 33.1 g/dL (ref 30.0–36.0)
MCV: 90.1 fL (ref 80.0–100.0)
Monocytes Absolute: 0.4 10*3/uL (ref 0.1–1.0)
Monocytes Relative: 12 %
Neutro Abs: 2.4 10*3/uL (ref 1.7–7.7)
Neutrophils Relative %: 67 %
Platelet Count: 150 10*3/uL (ref 150–400)
RBC: 3.72 MIL/uL — ABNORMAL LOW (ref 4.22–5.81)
RDW: 14.8 % (ref 11.5–15.5)
WBC Count: 3.6 10*3/uL — ABNORMAL LOW (ref 4.0–10.5)
nRBC: 0 % (ref 0.0–0.2)

## 2022-10-23 MED ORDER — PALONOSETRON HCL INJECTION 0.25 MG/5ML
0.2500 mg | Freq: Once | INTRAVENOUS | Status: AC
  Administered 2022-10-23: 0.25 mg via INTRAVENOUS

## 2022-10-23 MED ORDER — SODIUM CHLORIDE 0.9 % IV SOLN
10.0000 mg | Freq: Once | INTRAVENOUS | Status: AC
  Administered 2022-10-23: 10 mg via INTRAVENOUS
  Filled 2022-10-23: qty 10

## 2022-10-23 MED ORDER — FLUOROURACIL CHEMO INJECTION 2.5 GM/50ML
400.0000 mg/m2 | Freq: Once | INTRAVENOUS | Status: AC
  Administered 2022-10-23: 1000 mg via INTRAVENOUS
  Filled 2022-10-23: qty 20

## 2022-10-23 MED ORDER — SODIUM CHLORIDE 0.9 % IV SOLN
400.0000 mg/m2 | Freq: Once | INTRAVENOUS | Status: AC
  Administered 2022-10-23: 1020 mg via INTRAVENOUS
  Filled 2022-10-23: qty 50

## 2022-10-23 MED ORDER — SODIUM CHLORIDE 0.9 % IV SOLN: Freq: Once | INTRAVENOUS | Status: AC

## 2022-10-23 MED ORDER — SODIUM CHLORIDE 0.9% FLUSH
10.0000 mL | Freq: Once | INTRAVENOUS | Status: AC
  Administered 2022-10-23: 10 mL

## 2022-10-23 MED ORDER — SODIUM CHLORIDE 0.9 % IV SOLN
2400.0000 mg/m2 | INTRAVENOUS | Status: DC
  Administered 2022-10-23: 6100 mg via INTRAVENOUS
  Filled 2022-10-23: qty 122

## 2022-10-23 MED ORDER — SODIUM CHLORIDE 0.9 % IV SOLN
180.0000 mg/m2 | Freq: Once | INTRAVENOUS | Status: AC
  Administered 2022-10-23: 460 mg via INTRAVENOUS
  Filled 2022-10-23: qty 1.79

## 2022-10-23 NOTE — Progress Notes (Signed)
Patient seen by Dr. Addison Naegeli are within treatment parameters.  Labs reviewed: and are within treatment parameters./ CMP pending  Per physician team, patient is ready for treatment and there are NO modifications to the treatment plan.

## 2022-10-23 NOTE — Progress Notes (Signed)
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  HISTORY OF PRESENTING ILLNESS:  Leon Kishbaugh. is a wonderful 32 y.o. 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 He presents today accompanied by his mother. He reports He is doing well.  He reports no color change in stool.  He notes that he is eating well but feels food just sits in stomach.  We discussed getting additional scans and labs for further evaluation and treatment which he was agreeable to. We further discussed getting MRI of brain and PET scan which he was agreeable to.  He notes that he is trying to hold it together mentally and is still trying to process his new diagnosis. We discussed the options of psychological or spiritual counseling which he has not decided on at this time. He was advised that may call back if he decides that he would like to pursue either.  We discussed getting a port a cath installed which he was agreeable to.  He recently had a biliary stent put into common bile duct.   No abdominal pain or change in bowel habits. No new or unexpected weight loss. No other new or acute focal symptoms.  We discussed his recent stomach biopsy done 07/13/2021 which sowed gastropathy and minor chronic gastritis with lymphoid aggregate. Negative for H. Pylori.  We discussed recent cytology done 07/13/2021 which showed presence of malignant cells in pancreas and poorly differentiated/high grade neuroendocrine carcinoma.  We discussed upper endoscopy done 07/13/2021 revealed mass in pancreatic head and  some enlarged lymph nodes were seen in the celiac and peripancreatic regions.  Labs done today were reviewed in detail.  INTERVAL HISTORY:  Leon Liv. Is a 32 y.o. 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   Patient was last seen by me on 09/25/2022 and reported recent weight gain and was doing well overall with no other medical complaints.   He was most recently seen by Mariane Baumgarten, PA on 10/09/2022 and was doing well overall with no new medical concerns.   Today, he returns for toxicity check prior to cycle 7 day 1 of Folifiri. He is accompanied by his mother. Patient tolerated his last cycle of Folfiri well without any new or severe toxicities.   He denies any abdominal pain, SOB, chest pain, change in bowel habits, urinary symptoms, leg swelling, tingling symptoms, testicular pain/swelling, or nausea. Patient does report recent weight gain.   Patient expresses interest in returning to work down the line. He reports that he is receiving social security disability benefits at this time.  MEDICAL HISTORY:  Past Medical History:  Diagnosis Date   Asthma    Neuroendocrine cancer (HCC)     SURGICAL HISTORY: Past Surgical History:  Procedure Laterality Date   BILIARY DILATION  07/13/2021   Procedure: BILIARY DILATION;  Surgeon: Vida Rigger, MD;  Location: [facility];  Service: Gastroenterology;;   BILIARY STENT PLACEMENT N/A 07/13/2021   Procedure: BILIARY STENT PLACEMENT;  Surgeon: Vida Rigger, MD;  Location: [name];  Service: Gastroenterology;  Laterality: N/A;   BILIARY STENT PLACEMENT N/A 11/05/2021   Procedure: BILIARY STENT PLACEMENT;  Surgeon: Ewing Schlein,  Vernia Buff, MD;  Location: [facility];  Service: Gastroenterology;  Laterality: N/A;   BIOPSY  07/13/2021   Procedure: BIOPSY;  Surgeon: Meridee Score Netty Starring., MD;  Location: [facility];  Service: Gastroenterology;;   CHOLECYSTECTOMY N/A 11/07/2021    Procedure: LAPAROSCOPIC CHOLECYSTECTOMY;  Surgeon: Berna Bue, MD;  Location: [facility];  Service: General;  Laterality: N/A;   ENDOSCOPIC RETROGRADE CHOLANGIOPANCREATOGRAPHY (ERCP) WITH PROPOFOL N/A 07/13/2021   Procedure: ENDOSCOPIC RETROGRADE CHOLANGIOPANCREATOGRAPHY (ERCP) WITH PROPOFOL;  Surgeon: Vida Rigger, MD;  Location: [name];  Service: Gastroenterology;  Laterality: N/A;   ERCP N/A 11/05/2021   Procedure: ENDOSCOPIC RETROGRADE CHOLANGIOPANCREATOGRAPHY (ERCP);  Surgeon: Vida Rigger, MD;  Location: [facility];  Service: Gastroenterology;  Laterality: N/A;   ESOPHAGOGASTRODUODENOSCOPY (EGD) WITH PROPOFOL N/A 07/13/2021   Procedure: ESOPHAGOGASTRODUODENOSCOPY (EGD) WITH PROPOFOL;  Surgeon: Meridee Score Netty Starring., MD;  Location: [name];  Service: Gastroenterology;  Laterality: N/A;   EUS N/A 07/13/2021   Procedure: UPPER ENDOSCOPIC ULTRASOUND (EUS) LINEAR;  Surgeon: Lemar Lofty., MD;  Location: [name];  Service: Gastroenterology;  Laterality: N/A;   FINE NEEDLE ASPIRATION  07/13/2021   Procedure: FINE NEEDLE ASPIRATION (FNA) LINEAR;  Surgeon: Lemar Lofty., MD;  Location: [facility];  Service: Gastroenterology;;   IR IMAGING GUIDED PORT INSERTION  07/31/2021   SPHINCTEROTOMY  07/13/2021   Procedure: Dennison Mascot;  Surgeon: Vida Rigger, MD;  Location: [name];  Service: Gastroenterology;;    SOCIAL HISTORY: Social History   Socioeconomic History   Marital status: Single    Spouse name: Not on file   Number of children: 0   Years of education: Not on file   Highest education level: Some college, no degree  Occupational History   Not on file  Tobacco Use   Smoking status: Some Days    Types: Cigarettes   Smokeless tobacco: Never  Vaping Use   Vaping status: Never Used  Substance and Sexual Activity   Alcohol use: Yes    Comment: social   Drug use: No   Sexual activity: Never  Other Topics Concern   Not on file  Social History  Narrative   Not on file   Social Determinants of Health   Financial Resource Strain: Low Risk  (04/14/2017)   Overall Financial Resource Strain (CARDIA)    Difficulty of Paying Living Expenses: Not hard at all  Food Insecurity: No Food Insecurity (11/07/2021)   Hunger Vital Sign    Worried About Running Out of Food in the Last Year: Never true    Ran Out of Food in the Last Year: Never true  Transportation Needs: No Transportation Needs (11/07/2021)   PRAPARE - Administrator, Civil Service (Medical): No    Lack of Transportation (Non-Medical): No  Physical Activity: Inactive (04/14/2017)   Exercise Vital Sign    Days of Exercise per Week: 0 days    Minutes of Exercise per Session: 0 min  Stress: Stress Concern Present (04/14/2017)   [name] of Occupational Health - Occupational Stress Questionnaire    Feeling of Stress : Rather much  Social Connections: Unknown (06/19/2021)   Received from [facility]   Social Network    Social Network: Not on file  Intimate Partner Violence: Not At Risk (11/07/2021)   Humiliation, Afraid, Rape, and Kick questionnaire    Fear of Current or Ex-Partner: No    Emotionally Abused: No    Physically Abused: No    Sexually Abused: No    FAMILY HISTORY: Family History  Problem Relation Age of Onset   Drug abuse Father    ALLERGIES:  is allergic to shellfish allergy and other.  MEDICATIONS:  Current Outpatient Medications  Medication Sig Dispense Refill   acetaminophen (TYLENOL) 500 MG tablet Take 1,000 mg by mouth every 6 (six) hours as needed for mild pain.     albuterol (VENTOLIN HFA) 108 (90 Base) MCG/ACT inhaler Inhale 1-2 puffs into the lungs every 6 (six) hours as needed for wheezing or shortness of breath. 18 g 0   dexamethasone (DECADRON) 4 MG tablet Take 2 tablets (8 mg total) by mouth daily. Start the day after chemotherapy for 2 days. Take with food. 8 tablet 5   lidocaine-prilocaine (EMLA) cream  Apply to affected area once 30 g 3   loperamide (IMODIUM) 2 MG capsule Take 2 tabs by mouth with first loose stool, then 1 tab with each additional loose stool as needed. Do not exceed 8 tabs in a 24-hour period 30 capsule 0   ondansetron (ZOFRAN) 8 MG tablet Take 1 tablet (8 mg total) by mouth every 8 (eight) hours as needed for nausea, vomiting or refractory nausea / vomiting. Start on the third day after chemotherapy. 30 tablet 1   oxyCODONE (OXY IR/ROXICODONE) 5 MG immediate release tablet Take 1 tablet (5 mg total) by mouth every 6 (six) hours as needed for severe pain or moderate pain. 20 tablet 0   polyethylene glycol (MIRALAX / GLYCOLAX) 17 g packet Take 17 g by mouth daily. 14 each 0   prochlorperazine (COMPAZINE) 10 MG tablet Take 1 tablet (10 mg total) by mouth every 6 (six) hours as needed for nausea or vomiting. 30 tablet 1   senna-docusate (SENOKOT-S) 8.6-50 MG tablet Take 2 tablets by mouth 2 (two) times daily. 30 tablet 0   No current facility-administered medications for this visit.    REVIEW OF SYSTEMS:    10 Point review of Systems was done is negative except as noted above.   PHYSICAL EXAMINATION: ECOG PERFORMANCE STATUS: 1 - Symptomatic but completely ambulatory .BP (!) 124/96   Pulse 85   Temp (!) 97.5 F (36.4 C)   Resp 20   Wt (!) 301 lb 11.2 oz (136.9 kg)   SpO2 100%   BMI 40.92 kg/m  GENERAL:alert, in no acute distress and comfortable SKIN: no acute rashes, no significant lesions EYES: conjunctiva are pink and non-injected, sclera anicteric OROPHARYNX: MMM, no exudates, no oropharyngeal erythema or ulceration NECK: supple, no JVD LYMPH:  no palpable lymphadenopathy in the cervical, axillary or inguinal regions LUNGS: clear to auscultation b/l with normal respiratory effort HEART: regular rate & rhythm ABDOMEN:  normoactive bowel sounds , non tender, not distended. Extremity: no pedal edema PSYCH: alert & oriented x 3 with fluent speech NEURO: no focal  motor/sensory deficits   LABORATORY DATA:  I have reviewed the data as listed    Latest Ref Rng & Units 10/23/2022   11:40 AM 10/09/2022   11:31 AM 09/25/2022    9:22 AM  CBC  WBC 4.0 - 10.5 K/uL 3.6  3.4  3.4   Hemoglobin 13.0 - 17.0 g/dL 40.9  81.1  91.4   Hematocrit 39.0 - 52.0 % 33.5  33.6  33.2   Platelets 150 - 400 K/uL 150  123  146       Latest Ref Rng & Units 10/09/2022   11:31 AM 09/25/2022    9:22 AM 09/11/2022   11:20 AM  CMP  Glucose 70 - 99 mg/dL  112  117  108   BUN 6 - 20 mg/dL 10  12  9    Creatinine 0.61 - 1.24 mg/dL 7.82  9.56  2.13   Sodium 135 - 145 mmol/L 141  138  140   Potassium 3.5 - 5.1 mmol/L 3.5  3.7  3.6   Chloride 98 - 111 mmol/L 109  106  108   CO2 22 - 32 mmol/L 27  28  27    Calcium 8.9 - 10.3 mg/dL 9.2  9.2  9.0   Total Protein 6.5 - 8.1 g/dL 6.9  7.1  6.9   Total Bilirubin 0.3 - 1.2 mg/dL 0.8  0.6  0.5   Alkaline Phos 38 - 126 U/L 83  93  104   AST 15 - 41 U/L 18  22  21    ALT 0 - 44 U/L 17  23  24     Cytology done 07/13/2021 revealed "FINAL MICROSCOPIC DIAGNOSIS:  A. PANCREAS, HEAD, FINE NEEDLE ASPIRATION:  - Malignant cells present  - Poorly differentiated/high-grade neuroendocrine carcinoma (see  comment)   B. CBD STRICTURE, BRUSHING:  - Atypical cells suspicious for tumor   C. BILIARY DILATION, BALLOON, REMOVAL:  - Atypical cells suspicious for tumor "  Surgical pathology done 07/13/2021 revealed "FINAL MICROSCOPIC DIAGNOSIS:   A. STOMACH, BIOPSY:  Reactive gastropathy and minimal chronic gastritis with lymphoid  aggregate  Negative for H. pylori, intestinal metaplasia, dysplasia and carcinoma"   Pathology 11/07/21: FINAL MICROSCOPIC DIAGNOSIS:   A. GALLBLADDER, CHOLECYSTECTOMY:  - Acute cholecystitis with necrosis and abscess.   RADIOGRAPHIC STUDIES: I have personally reviewed the radiological images as listed and agreed with the findings in the report. CT CHEST ABDOMEN PELVIS W CONTRAST  Result Date: 10/23/2022 CLINICAL  DATA:  Metastatic high-grade neuroendocrine tumor restaging * Tracking Code: BO * EXAM: CT CHEST, ABDOMEN, AND PELVIS WITH CONTRAST TECHNIQUE: Multidetector CT imaging of the chest, abdomen and pelvis was performed following the standard protocol during bolus administration of intravenous contrast. RADIATION DOSE REDUCTION: This exam was performed according to the departmental dose-optimization program which includes automated exposure control, adjustment of the mA and/or kV according to patient size and/or use of iterative reconstruction technique. CONTRAST:  OMNIPAQUE IOHEXOL 300 MG/ML  SOLN COMPARISON:  07/12/2022 FINDINGS: CT CHEST FINDINGS Cardiovascular: Right Port-A-Cath tip: Right atrium. Mediastinum/Nodes: Unremarkable Lungs/Pleura: Tiny calcified granuloma medially in the right lower lobe on image 89 of series 4, benign and stable. Musculoskeletal: Stable 6 mm sharply defined sclerotic lesion in the left tenth rib posteriorly, likely benign. Mild thoracic spondylosis. Bilateral gynecomastia. CT ABDOMEN PELVIS FINDINGS Hepatobiliary: Multiple hypodense hepatic lesions are again observed. Index segment 4 lesion 1.7 by 1.5 cm on image 47 series 2, stable by my measurements. Index segment 6 lesion 1.2 by 1.0 cm on image 42 series 2, stable. The other scattered lesions likewise appear stable. No new lesions identified. Pneumobilia. Cholecystectomy. Stable indistinctness of tissue planes along the porta hepatis. Expandable biliary stent along the distal CBD, without substantial biliary dilatation proximal to this level. Pancreas: Dilated dorsal pancreatic duct with atrophic pancreatic tail and body. Ill definition of tissue planes in the pancreatic head with ill-defined pancreatic head mass measuring about 5.7 by 3.1 cm on image 62 of series 2, stable from 07/12/2022. 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 Reconstitution of the splenic vein noted. Spleen: Stable borderline splenomegaly. Adrenals/Urinary Tract: Unremarkable Stomach/Bowel: Indistinctness of soft tissue  planes and stranding around the descending did and proximal transverse duodenum as on prior exams, possibly related to inflammation or local therapy. Otherwise unremarkable. 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 This appearance is similar to previous. The pancreatic head mass abuts the right margin of the celiac trunk and right margin of the SMA. Retrocaval node 1.9 cm in short axis on image 66 series 2, stable by my measurements. Aortocaval node 1.4 cm in short axis on image 67 series 2, previously the same by my measurements. Reproductive: Unremarkable Other: Mild ascites. Edema along the porta hepatis around the pancreatic head region. Musculoskeletal: Bridging spurring of the anterior SI joints. IMPRESSION: 1. 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 The pancreatic head mass abuts the right margin of the celiac trunk and right margin of the SMA. 2. Stable retroperitoneal adenopathy. 3. Stable hepatic metastases. 4. Stable indistinctness of tissue planes along the porta hepatis and around the descending duodenum and proximal transverse duodenum, possibly related to inflammation or local therapy. 5. Mild ascites. 6. Stable borderline splenomegaly. 7. Stable 6 mm sharply defined sclerotic lesion in the left tenth rib posteriorly, likely benign. Electronically Signed   By: Gaylyn Rong M.D.   On: 10/23/2022 12:45     ASSESSMENT & PLAN:   32 y.o. very pleasant  male with  1. 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 -Cytology done 07/13/2021 shows presence of malignant cells in pancreas and poorly differentiated/high grade neuroendocrine carcinoma.  PLAN: -Discussed lab results on 10/23/22 in detail with patient. CBC showed WBC of 3.6K, hemoglobin of 11.1, and platelets of 150K. -CMP pending, -CT chest/abdm/pelvis scan on 10/17/2022 shows stable findings with no overt evidence of disease progression. -no indication to change treatment at this time.  -Patient tolerated the last cycle of his Folfiri treatment without any new or notable toxicities.  -would recommend continuing Folfiri treatment at this time. Discussed that it would always be the patient's choice whether or not to continue treatment based on his wishes -will continue treatment every 2 weeks at this time. Discussed that there is an option of adjusting treatment if needed.  -Patient can proceed with cycle 7 of Folfiri without any dose modification.   -discussed option of potential local liver-directed treatment or other potential treatment options down the line -Educated patient that [facility] may cause increased melanin production and darkening of the skin -patient reports that he is considering receiving a tattoo soon. Discussed that there may be some element of infection risk. if receiving a tattoo is important to him, it would be okay to receive one 10-days post chemotherapy.  -answered all of patient's and his mother's questions regarding treatment in detail  FOLLOW-UP: Per integrated scheduling  The total time spent in the appointment was 31 minutes* .  All of the patient's questions were answered with apparent satisfaction. The patient knows to call the clinic with any problems, questions or concerns.   Wyvonnia Lora MD MS AAHIVMS [facility] Hematology/Oncology Physician [facility]  .*Total Encounter Time as defined by the Centers for Medicare and Medicaid Services includes, in  addition to the face-to-face time of a patient visit (documented in the note above) non-face-to-face time: obtaining and reviewing outside history, ordering and reviewing medications, tests or procedures, care coordination (communications with other health care professionals or caregivers) and documentation in the medical record.    I,Mitra Faeizi,acting as a Neurosurgeon for Wyvonnia Lora, MD.,have documented all relevant documentation on the behalf of Wyvonnia Lora, MD,as directed by  Wyvonnia Lora, MD while in the presence of Wyvonnia Lora, MD.  .I have reviewed the above documentation for accuracy and completeness, and I agree with the above. [name] MD

## 2022-10-23 NOTE — Patient Instructions (Signed)
[place] [facility]  Discharge Instructions: Thank you for choosing [facility] to provide your oncology and hematology care.   If you have a lab appointment with the Cancer Center, please go directly to the Cancer Center and check in at the registration area.   Wear comfortable clothing and clothing appropriate for easy access to any Portacath or PICC line.   We strive to give you quality time with your provider. You may need to reschedule your appointment if you arrive late (15 or more minutes).  Arriving late affects you and other patients whose appointments are after yours.  Also, if you miss three or more appointments without notifying the office, you may be dismissed from the clinic at the provider's discretion.      For prescription refill requests, have your pharmacy contact our office and allow 72 hours for refills to be completed.    Today you received the following chemotherapy and/or immunotherapy agents: Irinotecan/Leucovorin/Fluorouracil       To help prevent nausea and vomiting after your treatment, we encourage you to take your nausea medication as directed.  BELOW ARE SYMPTOMS THAT SHOULD BE REPORTED IMMEDIATELY: *FEVER GREATER THAN 100.4 F (38 C) OR HIGHER *CHILLS OR SWEATING *NAUSEA AND VOMITING THAT IS NOT CONTROLLED WITH YOUR NAUSEA MEDICATION *UNUSUAL SHORTNESS OF BREATH *UNUSUAL BRUISING OR BLEEDING *URINARY PROBLEMS (pain or burning when urinating, or frequent urination) *BOWEL PROBLEMS (unusual diarrhea, constipation, pain near the anus) TENDERNESS IN MOUTH AND THROAT WITH OR WITHOUT PRESENCE OF ULCERS (sore throat, sores in mouth, or a toothache) UNUSUAL RASH, SWELLING OR PAIN  UNUSUAL VAGINAL DISCHARGE OR ITCHING   Items with * indicate a potential emergency and should be followed up as soon as possible or go to the Emergency Department if any problems should occur.  Please show the CHEMOTHERAPY ALERT CARD or  IMMUNOTHERAPY ALERT CARD at check-in to the Emergency Department and triage nurse.  Should you have questions after your visit or need to cancel or reschedule your appointment, please contact [place] [facility]  Dept: [phone]  and follow the prompts.  Office hours are 8:00 a.m. to 4:30 p.m. Monday - Friday. Please note that voicemails left after 4:00 p.m. may not be returned until the following business day.  We are closed weekends and major holidays. You have access to a nurse at all times for urgent questions. Please call the main number to the clinic Dept: [phone] and follow the prompts.   For any non-urgent questions, you may also contact your provider using MyChart. We now offer e-Visits for anyone 55 and older to request care online for non-urgent symptoms. For details visit [email or web address].   Also download the MyChart app! Go to the app store, search "MyChart", open the app, select [facility], and log in with your MyChart username and password.  The chemotherapy medication bag should finish at 46 hours, 96 hours, or 7 days. For example, if your pump is scheduled for 46 hours and it was put on at 4:00 p.m., it should finish at 2:00 p.m. the day it is scheduled to come off regardless of your appointment time.     Estimated time to finish at approximately 1 PM on 09/13/2022.   If the display on your pump reads "Low Volume" and it is beeping, take the batteries out of the pump and come to the cancer center for it to be taken off.   If the pump alarms  go off prior to the pump reading "Low Volume" then call [phone] and someone can assist you.  If the plunger comes out and the chemotherapy medication is leaking out, please use your home chemo spill kit to clean up the spill. Do NOT use paper towels or other household products.  If you have problems or questions regarding your pump, please call either [phone] (24 hours a day) or the  cancer center Monday-Friday 8:00 a.m.- 4:30 p.m. at the clinic number and we will assist you. If you are unable to get assistance, then go to the nearest Emergency Department and ask the staff to contact the IV team for assistance.

## 2022-10-25 ENCOUNTER — Inpatient Hospital Stay: Payer: 59

## 2022-10-25 VITALS — BP 120/83 | HR 80 | Temp 98.3°F | Resp 17

## 2022-10-25 DIAGNOSIS — C7A1 Malignant poorly differentiated neuroendocrine tumors: Secondary | ICD-10-CM

## 2022-10-25 DIAGNOSIS — Z5111 Encounter for antineoplastic chemotherapy: Secondary | ICD-10-CM | POA: Diagnosis not present

## 2022-10-25 DIAGNOSIS — Z7189 Other specified counseling: Secondary | ICD-10-CM

## 2022-10-25 MED ORDER — SODIUM CHLORIDE 0.9% FLUSH
10.0000 mL | INTRAVENOUS | Status: DC | PRN
  Administered 2022-10-25: 10 mL

## 2022-10-25 MED ORDER — HEPARIN SOD (PORK) LOCK FLUSH 100 UNIT/ML IV SOLN
500.0000 [IU] | Freq: Once | INTRAVENOUS | Status: AC | PRN
  Administered 2022-10-25: 500 [IU]

## 2022-10-26 ENCOUNTER — Other Ambulatory Visit: Payer: Self-pay

## 2022-10-28 ENCOUNTER — Encounter: Payer: Self-pay | Admitting: Hematology

## 2022-11-05 MED FILL — Dexamethasone Sodium Phosphate Inj 100 MG/10ML: INTRAMUSCULAR | Qty: 1 | Status: AC

## 2022-11-06 ENCOUNTER — Inpatient Hospital Stay: Payer: Medicaid Other | Attending: Hematology

## 2022-11-06 ENCOUNTER — Inpatient Hospital Stay: Payer: Medicaid Other

## 2022-11-06 ENCOUNTER — Inpatient Hospital Stay (HOSPITAL_BASED_OUTPATIENT_CLINIC_OR_DEPARTMENT_OTHER): Payer: Medicaid Other | Admitting: Physician Assistant

## 2022-11-06 VITALS — BP 132/86 | HR 83 | Temp 97.5°F | Resp 20 | Wt 302.2 lb

## 2022-11-06 DIAGNOSIS — C7A1 Malignant poorly differentiated neuroendocrine tumors: Secondary | ICD-10-CM | POA: Insufficient documentation

## 2022-11-06 DIAGNOSIS — Z5111 Encounter for antineoplastic chemotherapy: Secondary | ICD-10-CM | POA: Insufficient documentation

## 2022-11-06 DIAGNOSIS — F17210 Nicotine dependence, cigarettes, uncomplicated: Secondary | ICD-10-CM | POA: Diagnosis not present

## 2022-11-06 DIAGNOSIS — C7B8 Other secondary neuroendocrine tumors: Secondary | ICD-10-CM | POA: Insufficient documentation

## 2022-11-06 DIAGNOSIS — Z7189 Other specified counseling: Secondary | ICD-10-CM

## 2022-11-06 DIAGNOSIS — Z95828 Presence of other vascular implants and grafts: Secondary | ICD-10-CM

## 2022-11-06 LAB — CBC WITH DIFFERENTIAL (CANCER CENTER ONLY)
Abs Immature Granulocytes: 0.01 10*3/uL (ref 0.00–0.07)
Basophils Absolute: 0 10*3/uL (ref 0.0–0.1)
Basophils Relative: 1 %
Eosinophils Absolute: 0.2 10*3/uL (ref 0.0–0.5)
Eosinophils Relative: 5 %
HCT: 31.3 % — ABNORMAL LOW (ref 39.0–52.0)
Hemoglobin: 10.6 g/dL — ABNORMAL LOW (ref 13.0–17.0)
Immature Granulocytes: 0 %
Lymphocytes Relative: 15 %
Lymphs Abs: 0.5 10*3/uL — ABNORMAL LOW (ref 0.7–4.0)
MCH: 30.2 pg (ref 26.0–34.0)
MCHC: 33.9 g/dL (ref 30.0–36.0)
MCV: 89.2 fL (ref 80.0–100.0)
Monocytes Absolute: 0.4 10*3/uL (ref 0.1–1.0)
Monocytes Relative: 11 %
Neutro Abs: 2.5 10*3/uL (ref 1.7–7.7)
Neutrophils Relative %: 68 %
Platelet Count: 117 10*3/uL — ABNORMAL LOW (ref 150–400)
RBC: 3.51 MIL/uL — ABNORMAL LOW (ref 4.22–5.81)
RDW: 14.6 % (ref 11.5–15.5)
WBC Count: 3.6 10*3/uL — ABNORMAL LOW (ref 4.0–10.5)
nRBC: 0 % (ref 0.0–0.2)

## 2022-11-06 LAB — CMP (CANCER CENTER ONLY)
ALT: 23 U/L (ref 0–44)
AST: 21 U/L (ref 15–41)
Albumin: 3.9 g/dL (ref 3.5–5.0)
Alkaline Phosphatase: 92 U/L (ref 38–126)
Anion gap: 6 (ref 5–15)
BUN: 11 mg/dL (ref 6–20)
CO2: 26 mmol/L (ref 22–32)
Calcium: 9.2 mg/dL (ref 8.9–10.3)
Chloride: 107 mmol/L (ref 98–111)
Creatinine: 0.85 mg/dL (ref 0.61–1.24)
GFR, Estimated: >60 mL/min (ref >60)
Glucose, Bld: 100 mg/dL — ABNORMAL HIGH (ref 70–99)
Potassium: 3.7 mmol/L (ref 3.5–5.1)
Sodium: 139 mmol/L (ref 135–145)
Total Bilirubin: 0.7 mg/dL (ref 0.3–1.2)
Total Protein: 7 g/dL (ref 6.5–8.1)

## 2022-11-06 MED ORDER — PALONOSETRON HCL INJECTION 0.25 MG/5ML
0.2500 mg | Freq: Once | INTRAVENOUS | Status: AC
  Administered 2022-11-06: 0.25 mg via INTRAVENOUS
  Filled 2022-11-06: qty 5

## 2022-11-06 MED ORDER — SODIUM CHLORIDE 0.9% FLUSH
10.0000 mL | INTRAVENOUS | Status: DC | PRN
  Administered 2022-11-06: 10 mL

## 2022-11-06 MED ORDER — ATROPINE SULFATE 1 MG/ML IV SOLN
0.5000 mg | Freq: Once | INTRAVENOUS | Status: AC | PRN
  Administered 2022-11-06: 0.5 mg via INTRAVENOUS
  Filled 2022-11-06: qty 1

## 2022-11-06 MED ORDER — SODIUM CHLORIDE 0.9 % IV SOLN
2400.0000 mg/m2 | INTRAVENOUS | Status: DC
  Administered 2022-11-06: 6100 mg via INTRAVENOUS
  Filled 2022-11-06: qty 122

## 2022-11-06 MED ORDER — SODIUM CHLORIDE 0.9 % IV SOLN
180.0000 mg/m2 | Freq: Once | INTRAVENOUS | Status: AC
  Administered 2022-11-06: 460 mg via INTRAVENOUS
  Filled 2022-11-06: qty 23

## 2022-11-06 MED ORDER — FLUOROURACIL CHEMO INJECTION 2.5 GM/50ML
400.0000 mg/m2 | Freq: Once | INTRAVENOUS | Status: AC
  Administered 2022-11-06: 1000 mg via INTRAVENOUS
  Filled 2022-11-06: qty 20

## 2022-11-06 MED ORDER — SODIUM CHLORIDE 0.9 % IV SOLN: Freq: Once | INTRAVENOUS | Status: AC

## 2022-11-06 MED ORDER — SODIUM CHLORIDE 0.9% FLUSH
10.0000 mL | Freq: Once | INTRAVENOUS | Status: AC
  Administered 2022-11-06: 10 mL

## 2022-11-06 MED ORDER — SODIUM CHLORIDE 0.9 % IV SOLN
10.0000 mg | Freq: Once | INTRAVENOUS | Status: AC
  Administered 2022-11-06: 10 mg via INTRAVENOUS
  Filled 2022-11-06: qty 10

## 2022-11-06 MED ORDER — SODIUM CHLORIDE 0.9 % IV SOLN
400.0000 mg/m2 | Freq: Once | INTRAVENOUS | Status: AC
  Administered 2022-11-06: 1020 mg via INTRAVENOUS
  Filled 2022-11-06: qty 50

## 2022-11-06 NOTE — Progress Notes (Signed)
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  04/24/2022-07/17/2022: Received 6 cycles of FOLFOX, oxaliplatin discontinued with 6th cycle due to elevated LFTs.   CURRENT TREATMENT: FOLFIRI 07/31/22: Cycle 1 08/13/22: Cycle 2 08/28/22: Cycle 3 09/11/22: Cycle 4 09/25/22: Cycle 5 10/09/22: Cycle 6 10/23/22: Cycle 7 11/06/22: Cycle 8  INTERVAL HISTORY:  Leon Fischer. Is a 32 y.o. male returns today for a follow up for high grade neuroendocrine carcinoma prior to Cycle 8, Day 1 of FOLFIRI chemotherapy.He is accompanied by his mother for this visit.   Leon Fischer reports continues to tolerate chemotherapy. His energy and appetite are stable. He denies nausea, vomiting or bowel habit changes. He denies easy bruising or signs of bleeding. He denies fevers, chills, night sweats, shortness of breath, chest pain or cough. He has no other complaints.  MEDICAL HISTORY:  Past Medical History:  Diagnosis Date   Asthma    Neuroendocrine cancer (HCC)     SURGICAL HISTORY: Past Surgical History:  Procedure Laterality Date   BILIARY DILATION  07/13/2021   Procedure: BILIARY DILATION;  Surgeon: Vida Rigger, MD;  Location: [facility];  Service: Gastroenterology;;   BILIARY STENT PLACEMENT N/A 07/13/2021   Procedure: BILIARY STENT PLACEMENT;  Surgeon: Vida Rigger, MD;  Location: [name];  Service: Gastroenterology;  Laterality: N/A;   BILIARY STENT PLACEMENT N/A 11/05/2021   Procedure: BILIARY STENT PLACEMENT;  Surgeon: Vida Rigger, MD;  Location: [name];  Service: Gastroenterology;  Laterality:  N/A;   BIOPSY  07/13/2021   Procedure: BIOPSY;  Surgeon: Meridee Score Netty Starring., MD;  Location: [facility];  Service: Gastroenterology;;   CHOLECYSTECTOMY N/A 11/07/2021   Procedure: LAPAROSCOPIC CHOLECYSTECTOMY;  Surgeon: Berna Bue, MD;  Location: [facility];  Service: General;  Laterality: N/A;   ENDOSCOPIC RETROGRADE CHOLANGIOPANCREATOGRAPHY (ERCP) WITH PROPOFOL N/A 07/13/2021   Procedure: ENDOSCOPIC RETROGRADE CHOLANGIOPANCREATOGRAPHY (ERCP) WITH PROPOFOL;  Surgeon: Vida Rigger, MD;  Location: [name];  Service: Gastroenterology;  Laterality: N/A;   ERCP N/A 11/05/2021   Procedure: ENDOSCOPIC RETROGRADE CHOLANGIOPANCREATOGRAPHY (ERCP);  Surgeon: Vida Rigger, MD;  Location: [facility];  Service: Gastroenterology;  Laterality: N/A;   ESOPHAGOGASTRODUODENOSCOPY (EGD) WITH PROPOFOL N/A 07/13/2021   Procedure: ESOPHAGOGASTRODUODENOSCOPY (EGD) WITH PROPOFOL;  Surgeon: Meridee Score Netty Starring., MD;  Location: [name];  Service: Gastroenterology;  Laterality: N/A;   EUS N/A 07/13/2021   Procedure: UPPER ENDOSCOPIC ULTRASOUND (EUS) LINEAR;  Surgeon: Lemar Lofty., MD;  Location: [name];  Service: Gastroenterology;  Laterality: N/A;   FINE NEEDLE ASPIRATION  07/13/2021   Procedure: FINE NEEDLE ASPIRATION (FNA) LINEAR;  Surgeon: Meridee Score Netty Starring., MD;  Location: [facility];  Service: Gastroenterology;;   IR IMAGING GUIDED PORT INSERTION  07/31/2021   SPHINCTEROTOMY  07/13/2021   Procedure: Dennison Mascot;  Surgeon: Vida Rigger, MD;  Location: [name];  Service: Gastroenterology;;    SOCIAL HISTORY: Social History   Socioeconomic History   Marital status: Single    Spouse name: Not on file   Number of children: 0   Years of education: Not on file   Highest education level: Some college, no degree  Occupational History   Not on file  Tobacco Use   Smoking status: Some Days  Types: Cigarettes   Smokeless tobacco: Never  Vaping Use   Vaping status: Never  Used  Substance and Sexual Activity   Alcohol use: Yes    Comment: social   Drug use: No   Sexual activity: Never  Other Topics Concern   Not on file  Social History Narrative   Not on file   Social Determinants of Health   Financial Resource Strain: Low Risk  (04/14/2017)   Overall Financial Resource Strain (CARDIA)    Difficulty of Paying Living Expenses: Not hard at all  Food Insecurity: No Food Insecurity (11/07/2021)   Hunger Vital Sign    Worried About Running Out of Food in the Last Year: Never true    Ran Out of Food in the Last Year: Never true  Transportation Needs: No Transportation Needs (11/07/2021)   PRAPARE - Administrator, Civil Service (Medical): No    Lack of Transportation (Non-Medical): No  Physical Activity: Inactive (04/14/2017)   Exercise Vital Sign    Days of Exercise per Week: 0 days    Minutes of Exercise per Session: 0 min  Stress: Stress Concern Present (04/14/2017)   [name] of Occupational Health - Occupational Stress Questionnaire    Feeling of Stress : Rather much  Social Connections: Unknown (06/19/2021)   [facility]   Social Network    Social Network: Not on file  Intimate Partner Violence: Not At Risk (11/07/2021)   Humiliation, Afraid, Rape, and Kick questionnaire    Fear of Current or Ex-Partner: No    Emotionally Abused: No    Physically Abused: No    Sexually Abused: No    FAMILY HISTORY: Family History  Problem Relation Age of Onset   Drug abuse Father    ALLERGIES:  is allergic to shellfish allergy and other.  MEDICATIONS:  Current Outpatient Medications  Medication Sig Dispense Refill   acetaminophen (TYLENOL) 500 MG tablet Take 1,000 mg by mouth every 6 (six) hours as needed for mild pain.     albuterol (VENTOLIN HFA) 108 (90 Base) MCG/ACT inhaler Inhale 1-2 puffs into the lungs every 6 (six) hours as needed for wheezing or shortness of breath. 18 g 0   dexamethasone  (DECADRON) 4 MG tablet Take 2 tablets (8 mg total) by mouth daily. Start the day after chemotherapy for 2 days. Take with food. 8 tablet 5   lidocaine-prilocaine (EMLA) cream Apply to affected area once 30 g 3   loperamide (IMODIUM) 2 MG capsule Take 2 tabs by mouth with first loose stool, then 1 tab with each additional loose stool as needed. Do not exceed 8 tabs in a 24-hour period 30 capsule 0   ondansetron (ZOFRAN) 8 MG tablet Take 1 tablet (8 mg total) by mouth every 8 (eight) hours as needed for nausea, vomiting or refractory nausea / vomiting. Start on the third day after chemotherapy. 30 tablet 1   oxyCODONE (OXY IR/ROXICODONE) 5 MG immediate release tablet Take 1 tablet (5 mg total) by mouth every 6 (six) hours as needed for severe pain or moderate pain. 20 tablet 0   polyethylene glycol (MIRALAX / GLYCOLAX) 17 g packet Take 17 g by mouth daily. 14 each 0   prochlorperazine (COMPAZINE) 10 MG tablet Take 1 tablet (10 mg total) by mouth every 6 (six) hours as needed for nausea or vomiting. 30 tablet 1   senna-docusate (SENOKOT-S) 8.6-50 MG tablet Take 2 tablets by mouth 2 (two) times daily. 30 tablet  0   No current facility-administered medications for this visit.    REVIEW OF SYSTEMS:    10 Point review of Systems was done is negative except as noted above.  PHYSICAL EXAMINATION: ECOG PERFORMANCE STATUS: 1 - Symptomatic but completely ambulatory Vitals:   11/06/22 1237  BP: 132/86  Pulse: 83  Resp: 20  Temp: (!) 97.5 F (36.4 C)  SpO2: 100%    GENERAL:alert, in no acute distress and comfortable SKIN: no acute rashes, no significant lesions EYES: conjunctiva are pink and non-injected, sclera anicteric NECK: supple, no JVD LUNGS: clear to auscultation b/l with normal respiratory effort HEART: regular rate & rhythm Extremity: no pedal edema PSYCH: alert & oriented x 3 with fluent speech NEURO: no focal motor/sensory deficits  LABORATORY DATA:  I have reviewed the data as  listed  .    Latest Ref Rng & Units 11/06/2022   12:15 PM 10/23/2022   11:40 AM 10/09/2022   11:31 AM  CBC  WBC 4.0 - 10.5 K/uL 3.6  3.6  3.4   Hemoglobin 13.0 - 17.0 g/dL 08.6  57.8  46.9   Hematocrit 39.0 - 52.0 % 31.3  33.5  33.6   Platelets 150 - 400 K/uL 117  150  123     .    Latest Ref Rng & Units 11/06/2022   12:15 PM 10/23/2022   11:40 AM 10/09/2022   11:31 AM  CMP  Glucose 70 - 99 mg/dL 629  96  528   BUN 6 - 20 mg/dL 11  12  10    Creatinine 0.61 - 1.24 mg/dL 4.13  2.44  0.10   Sodium 135 - 145 mmol/L 139  140  141   Potassium 3.5 - 5.1 mmol/L 3.7  3.8  3.5   Chloride 98 - 111 mmol/L 107  109  109   CO2 22 - 32 mmol/L 26  27  27    Calcium 8.9 - 10.3 mg/dL 9.2  9.3  9.2   Total Protein 6.5 - 8.1 g/dL 7.0  7.1  6.9   Total Bilirubin 0.3 - 1.2 mg/dL 0.7  0.8  0.8   Alkaline Phos 38 - 126 U/L 92  79  83   AST 15 - 41 U/L 21  18  18    ALT 0 - 44 U/L 23  18  17     Cytology done 07/13/2021 revealed "FINAL MICROSCOPIC DIAGNOSIS:  A. PANCREAS, HEAD, FINE NEEDLE ASPIRATION:  - Malignant cells present  - Poorly differentiated/high-grade neuroendocrine carcinoma (see  comment)   B. CBD STRICTURE, BRUSHING:  - Atypical cells suspicious for tumor   C. BILIARY DILATION, BALLOON, REMOVAL:  - Atypical cells suspicious for tumor "  Surgical pathology done 07/13/2021 revealed "FINAL MICROSCOPIC DIAGNOSIS:   A. STOMACH, BIOPSY:  Reactive gastropathy and minimal chronic gastritis with lymphoid  aggregate  Negative for H. pylori, intestinal metaplasia, dysplasia and carcinoma"  RADIOGRAPHIC STUDIES: I have personally reviewed the radiological images as listed and agreed with the findings in the report. CT CHEST ABDOMEN PELVIS W CONTRAST  Result Date: 10/23/2022 CLINICAL DATA:  Metastatic high-grade neuroendocrine tumor restaging * Tracking Code: BO * EXAM: CT CHEST, ABDOMEN, AND PELVIS WITH CONTRAST TECHNIQUE: Multidetector CT imaging of the chest, abdomen and pelvis was  performed following the standard protocol during bolus administration of intravenous contrast. RADIATION DOSE REDUCTION: This exam was performed according to the departmental dose-optimization program which includes automated exposure control, adjustment of the mA and/or kV according to patient size and/or  use of iterative reconstruction technique. CONTRAST:  OMNIPAQUE IOHEXOL 300 MG/ML  SOLN COMPARISON:  07/12/2022 FINDINGS: CT CHEST FINDINGS Cardiovascular: Right Port-A-Cath tip: Right atrium. Mediastinum/Nodes: Unremarkable Lungs/Pleura: Tiny calcified granuloma medially in the right lower lobe on image 89 of series 4, benign and stable. Musculoskeletal: Stable 6 mm sharply defined sclerotic lesion in the left tenth rib posteriorly, likely benign. Mild thoracic spondylosis. Bilateral gynecomastia. CT ABDOMEN PELVIS FINDINGS Hepatobiliary: Multiple hypodense hepatic lesions are again observed. Index segment 4 lesion 1.7 by 1.5 cm on image 47 series 2, stable by my measurements. Index segment 6 lesion 1.2 by 1.0 cm on image 42 series 2, stable. The other scattered lesions likewise appear stable. No new lesions identified. Pneumobilia. Cholecystectomy. Stable indistinctness of tissue planes along the porta hepatis. Expandable biliary stent along the distal CBD, without substantial biliary dilatation proximal to this level. Pancreas: Dilated dorsal pancreatic duct with atrophic pancreatic tail and body. Ill definition of tissue planes in the pancreatic head with ill-defined pancreatic head mass measuring about 5.7 by 3.1 cm on image 62 of series 2, stable from 07/12/2022. 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 Reconstitution of the splenic vein noted. Spleen: Stable borderline splenomegaly. Adrenals/Urinary Tract: Unremarkable Stomach/Bowel: Indistinctness of soft tissue planes and stranding around the  descending did and proximal transverse duodenum as on prior exams, possibly related to inflammation or local therapy. Otherwise unremarkable. 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 This appearance is similar to previous. The pancreatic head mass abuts the right margin of the celiac trunk and right margin of the SMA. Retrocaval node 1.9 cm in short axis on image 66 series 2, stable by my measurements. Aortocaval node 1.4 cm in short axis on image 67 series 2, previously the same by my measurements. Reproductive: Unremarkable Other: Mild ascites. Edema along the porta hepatis around the pancreatic head region. Musculoskeletal: Bridging spurring of the anterior SI joints. IMPRESSION: 1. 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 The pancreatic head mass abuts the right margin of the celiac trunk and right margin of the SMA. 2. Stable retroperitoneal adenopathy. 3. Stable hepatic metastases. 4. Stable indistinctness of tissue planes along the porta hepatis and around the descending duodenum and proximal transverse duodenum, possibly related to inflammation or local therapy. 5. Mild ascites. 6. Stable borderline splenomegaly. 7. Stable 6 mm sharply defined sclerotic lesion in the left tenth rib posteriorly, likely benign. Electronically Signed   By: Gaylyn Rong M.D.   On: 10/23/2022 12:45     ASSESSMENT & PLAN:  Leon Fischer. Is a 32 y.o. male who presents for a follow up for high grade neuroendocrine carcinoma.   1. 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 -Cytology  done 07/13/2021 shows presence of malignant cells in pancreas and poorly differentiated/high grade neuroendocrine carcinoma. -Received 6 cycles of first line chemotherapy with carboplatin and etoposide from 08/01/2021-12/26/2021.Treatment was discontinued after CT CAP from 04/04/2022 showed progression of disease. -Received 6 cycles of second line chemotherapy with FOLFOX from 04/24/2022-07/17/2022. Oxaliplatin discontinued with 6th cycle due to  elevated LFTs. -Started third line chemotherapy with FOLFIRI on 07/31/2022 -Most recent CT CAP from 10/17/2022 showed overall stable disease.  Plan -Due for Cycle 8, Day 1 of FOLFIRI today -Labs from today were reviewed with patient. WBC 3.6, Hgb 10.6, Plt 117K. LFTs and creatinine are normal -Proceed with treatment today without any dose modifications.  -RTC in 2 weeks for labs, follow up before Cycle 9 Day 1   #Anemia #Thrombocytopenia --Likely secondary to chemotherapy --Patient is asymptomatic (no fatigue, bruising, bleeding, etc) --Monitor for now.   All of the patient's questions were answered with apparent satisfaction. The patient knows to call the clinic with any problems, questions or concerns.  I have spent a total of 30 minutes minutes of face-to-face and non-face-to-face time, preparing to see the patient, , performing a medically appropriate examination, counseling and educating the patient,documenting clinical information in the electronic health record, and care coordination.   Georga Kaufmann PA-C Dept of Hematology and Oncology [facility] at [facility] Phone: [phone]

## 2022-11-06 NOTE — Patient Instructions (Signed)
[facility]  Discharge Instructions: Thank you for choosing [facility] to provide your oncology and hematology care.   If you have a lab appointment with the Cancer Center, please go directly to the Cancer Center and check in at the registration area.   Wear comfortable clothing and clothing appropriate for easy access to any Portacath or PICC line.   We strive to give you quality time with your provider. You may need to reschedule your appointment if you arrive late (15 or more minutes).  Arriving late affects you and other patients whose appointments are after yours.  Also, if you miss three or more appointments without notifying the office, you may be dismissed from the clinic at the provider's discretion.      For prescription refill requests, have your pharmacy contact our office and allow 72 hours for refills to be completed.    Today you received the following chemotherapy and/or immunotherapy agents: Irinotecan/Leucovorin/Fluorouracil     To help prevent nausea and vomiting after your treatment, we encourage you to take your nausea medication as directed.  BELOW ARE SYMPTOMS THAT SHOULD BE REPORTED IMMEDIATELY: *FEVER GREATER THAN 100.4 F (38 C) OR HIGHER *CHILLS OR SWEATING *NAUSEA AND VOMITING THAT IS NOT CONTROLLED WITH YOUR NAUSEA MEDICATION *UNUSUAL SHORTNESS OF BREATH *UNUSUAL BRUISING OR BLEEDING *URINARY PROBLEMS (pain or burning when urinating, or frequent urination) *BOWEL PROBLEMS (unusual diarrhea, constipation, pain near the anus) TENDERNESS IN MOUTH AND THROAT WITH OR WITHOUT PRESENCE OF ULCERS (sore throat, sores in mouth, or a toothache) UNUSUAL RASH, SWELLING OR PAIN  UNUSUAL VAGINAL DISCHARGE OR ITCHING   Items with * indicate a potential emergency and should be followed up as soon as possible or go to the Emergency Department if any problems should occur.  Please show the CHEMOTHERAPY ALERT CARD or  IMMUNOTHERAPY ALERT CARD at check-in to the Emergency Department and triage nurse.  Should you have questions after your visit or need to cancel or reschedule your appointment, please contact [facility]  Dept: [phone]  and follow the prompts.  Office hours are 8:00 a.m. to 4:30 p.m. Monday - Friday. Please note that voicemails left after 4:00 p.m. may not be returned until the following business day.  We are closed weekends and major holidays. You have access to a nurse at all times for urgent questions. Please call the main number to the clinic Dept: [phone] and follow the prompts.   For any non-urgent questions, you may also contact your provider using MyChart. We now offer e-Visits for anyone 27 and older to request care online for non-urgent symptoms. For details visit [email or web address].   Also download the MyChart app! Go to the app store, search "MyChart", open the app, select [place], and log in with your MyChart username and password.

## 2022-11-08 ENCOUNTER — Inpatient Hospital Stay: Payer: Medicaid Other

## 2022-11-08 ENCOUNTER — Other Ambulatory Visit: Payer: Self-pay

## 2022-11-08 VITALS — BP 124/82 | HR 73 | Temp 98.9°F | Resp 18

## 2022-11-08 DIAGNOSIS — Z5111 Encounter for antineoplastic chemotherapy: Secondary | ICD-10-CM | POA: Diagnosis not present

## 2022-11-08 DIAGNOSIS — Z7189 Other specified counseling: Secondary | ICD-10-CM

## 2022-11-08 DIAGNOSIS — C7A1 Malignant poorly differentiated neuroendocrine tumors: Secondary | ICD-10-CM

## 2022-11-08 MED ORDER — SODIUM CHLORIDE 0.9% FLUSH
10.0000 mL | INTRAVENOUS | Status: DC | PRN
  Administered 2022-11-08: 10 mL

## 2022-11-08 MED ORDER — HEPARIN SOD (PORK) LOCK FLUSH 100 UNIT/ML IV SOLN
500.0000 [IU] | Freq: Once | INTRAVENOUS | Status: AC | PRN
  Administered 2022-11-08: 500 [IU]

## 2022-11-19 ENCOUNTER — Inpatient Hospital Stay: Payer: Medicaid Other

## 2022-11-19 ENCOUNTER — Inpatient Hospital Stay (HOSPITAL_BASED_OUTPATIENT_CLINIC_OR_DEPARTMENT_OTHER): Payer: Medicaid Other | Admitting: Hematology

## 2022-11-19 VITALS — BP 130/89 | HR 67 | Temp 98.5°F | Resp 18

## 2022-11-19 DIAGNOSIS — Z7189 Other specified counseling: Secondary | ICD-10-CM

## 2022-11-19 DIAGNOSIS — C7A1 Malignant poorly differentiated neuroendocrine tumors: Secondary | ICD-10-CM | POA: Diagnosis not present

## 2022-11-19 DIAGNOSIS — Z5111 Encounter for antineoplastic chemotherapy: Secondary | ICD-10-CM | POA: Diagnosis not present

## 2022-11-19 DIAGNOSIS — Z95828 Presence of other vascular implants and grafts: Secondary | ICD-10-CM

## 2022-11-19 LAB — CBC WITH DIFFERENTIAL (CANCER CENTER ONLY)
Abs Immature Granulocytes: 0.01 10*3/uL (ref 0.00–0.07)
Basophils Absolute: 0 10*3/uL (ref 0.0–0.1)
Basophils Relative: 0 %
Eosinophils Absolute: 0.1 10*3/uL (ref 0.0–0.5)
Eosinophils Relative: 4 %
HCT: 34 % — ABNORMAL LOW (ref 39.0–52.0)
Hemoglobin: 11.3 g/dL — ABNORMAL LOW (ref 13.0–17.0)
Immature Granulocytes: 0 %
Lymphocytes Relative: 16 %
Lymphs Abs: 0.6 10*3/uL — ABNORMAL LOW (ref 0.7–4.0)
MCH: 29 pg (ref 26.0–34.0)
MCHC: 33.2 g/dL (ref 30.0–36.0)
MCV: 87.2 fL (ref 80.0–100.0)
Monocytes Absolute: 0.4 10*3/uL (ref 0.1–1.0)
Monocytes Relative: 11 %
Neutro Abs: 2.3 10*3/uL (ref 1.7–7.7)
Neutrophils Relative %: 69 %
Platelet Count: 151 10*3/uL (ref 150–400)
RBC: 3.9 MIL/uL — ABNORMAL LOW (ref 4.22–5.81)
RDW: 14.7 % (ref 11.5–15.5)
WBC Count: 3.4 10*3/uL — ABNORMAL LOW (ref 4.0–10.5)
nRBC: 0 % (ref 0.0–0.2)

## 2022-11-19 LAB — CMP (CANCER CENTER ONLY)
ALT: 26 U/L (ref 0–44)
AST: 25 U/L (ref 15–41)
Albumin: 4.3 g/dL (ref 3.5–5.0)
Alkaline Phosphatase: 98 U/L (ref 38–126)
Anion gap: 6 (ref 5–15)
BUN: 10 mg/dL (ref 6–20)
CO2: 26 mmol/L (ref 22–32)
Calcium: 9.6 mg/dL (ref 8.9–10.3)
Chloride: 108 mmol/L (ref 98–111)
Creatinine: 0.85 mg/dL (ref 0.61–1.24)
GFR, Estimated: >60 mL/min (ref >60)
Glucose, Bld: 98 mg/dL (ref 70–99)
Potassium: 3.6 mmol/L (ref 3.5–5.1)
Sodium: 140 mmol/L (ref 135–145)
Total Bilirubin: 0.9 mg/dL (ref 0.3–1.2)
Total Protein: 7.5 g/dL (ref 6.5–8.1)

## 2022-11-19 MED ORDER — SODIUM CHLORIDE 0.9 % IV SOLN
180.0000 mg/m2 | Freq: Once | INTRAVENOUS | Status: AC
  Administered 2022-11-19: 460 mg via INTRAVENOUS
  Filled 2022-11-19: qty 15

## 2022-11-19 MED ORDER — SODIUM CHLORIDE 0.9% FLUSH
10.0000 mL | Freq: Once | INTRAVENOUS | Status: AC
  Administered 2022-11-19: 10 mL

## 2022-11-19 MED ORDER — SODIUM CHLORIDE 0.9 % IV SOLN
400.0000 mg/m2 | Freq: Once | INTRAVENOUS | Status: AC
  Administered 2022-11-19: 1020 mg via INTRAVENOUS
  Filled 2022-11-19: qty 25

## 2022-11-19 MED ORDER — SODIUM CHLORIDE 0.9 % IV SOLN: Freq: Once | INTRAVENOUS | Status: AC

## 2022-11-19 MED ORDER — SODIUM CHLORIDE 0.9 % IV SOLN
2400.0000 mg/m2 | INTRAVENOUS | Status: DC
  Administered 2022-11-19: 6100 mg via INTRAVENOUS
  Filled 2022-11-19: qty 122

## 2022-11-19 MED ORDER — PALONOSETRON HCL INJECTION 0.25 MG/5ML
0.2500 mg | Freq: Once | INTRAVENOUS | Status: AC
  Administered 2022-11-19: 0.25 mg via INTRAVENOUS
  Filled 2022-11-19: qty 5

## 2022-11-19 MED ORDER — SODIUM CHLORIDE 0.9 % IV SOLN
10.0000 mg | Freq: Once | INTRAVENOUS | Status: AC
  Administered 2022-11-19: 10 mg via INTRAVENOUS
  Filled 2022-11-19: qty 10

## 2022-11-19 MED ORDER — ATROPINE SULFATE 1 MG/ML IV SOLN
0.5000 mg | Freq: Once | INTRAVENOUS | Status: AC | PRN
  Administered 2022-11-19: 0.5 mg via INTRAVENOUS
  Filled 2022-11-19: qty 1

## 2022-11-19 MED ORDER — FLUOROURACIL CHEMO INJECTION 2.5 GM/50ML
400.0000 mg/m2 | Freq: Once | INTRAVENOUS | Status: AC
  Administered 2022-11-19: 1000 mg via INTRAVENOUS
  Filled 2022-11-19: qty 20

## 2022-11-19 NOTE — Patient Instructions (Signed)
[facility]  Discharge Instructions: Thank you for choosing [facility] to provide your oncology and hematology care.   If you have a lab appointment with the Cancer Center, please go directly to the Cancer Center and check in at the registration area.   Wear comfortable clothing and clothing appropriate for easy access to any Portacath or PICC line.   We strive to give you quality time with your provider. You may need to reschedule your appointment if you arrive late (15 or more minutes).  Arriving late affects you and other patients whose appointments are after yours.  Also, if you miss three or more appointments without notifying the office, you may be dismissed from the clinic at the provider's discretion.      For prescription refill requests, have your pharmacy contact our office and allow 72 hours for refills to be completed.    Today you received the following chemotherapy and/or immunotherapy agents Irinotecan / Leucovorin / Fluorouracil      To help prevent nausea and vomiting after your treatment, we encourage you to take your nausea medication as directed.  BELOW ARE SYMPTOMS THAT SHOULD BE REPORTED IMMEDIATELY: *FEVER GREATER THAN 100.4 F (38 C) OR HIGHER *CHILLS OR SWEATING *NAUSEA AND VOMITING THAT IS NOT CONTROLLED WITH YOUR NAUSEA MEDICATION *UNUSUAL SHORTNESS OF BREATH *UNUSUAL BRUISING OR BLEEDING *URINARY PROBLEMS (pain or burning when urinating, or frequent urination) *BOWEL PROBLEMS (unusual diarrhea, constipation, pain near the anus) TENDERNESS IN MOUTH AND THROAT WITH OR WITHOUT PRESENCE OF ULCERS (sore throat, sores in mouth, or a toothache) UNUSUAL RASH, SWELLING OR PAIN  UNUSUAL VAGINAL DISCHARGE OR ITCHING   Items with * indicate a potential emergency and should be followed up as soon as possible or go to the Emergency Department if any problems should occur.  Please show the CHEMOTHERAPY ALERT CARD or  IMMUNOTHERAPY ALERT CARD at check-in to the Emergency Department and triage nurse.  Should you have questions after your visit or need to cancel or reschedule your appointment, please contact [facility]  Dept: [phone]  and follow the prompts.  Office hours are 8:00 a.m. to 4:30 p.m. Monday - Friday. Please note that voicemails left after 4:00 p.m. may not be returned until the following business day.  We are closed weekends and major holidays. You have access to a nurse at all times for urgent questions. Please call the main number to the clinic Dept: [phone] and follow the prompts.   For any non-urgent questions, you may also contact your provider using MyChart. We now offer e-Visits for anyone 64 and older to request care online for non-urgent symptoms. For details visit [email or web address].   Also download the MyChart app! Go to the app store, search "MyChart", open the app, select [place], and log in with your MyChart username and password.

## 2022-11-19 NOTE — Progress Notes (Signed)
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  HISTORY OF PRESENTING ILLNESS:  Leon Domagalski. is a wonderful 32 y.o. 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 He presents today accompanied by his mother. He reports He is doing well.  He reports no color change in stool.  He notes that he is eating well but feels food just sits in stomach.  We discussed getting additional scans and labs for further evaluation and treatment which he was agreeable to. We further discussed getting MRI of brain and PET scan which he was agreeable to.  He notes that he is trying to hold it together mentally and is still trying to process his new diagnosis. We discussed the options of psychological or spiritual counseling which he has not decided on at this time. He was advised that may call back if he decides that he would like to pursue either.  We discussed getting a port a cath installed which he was agreeable to.  He recently had a biliary stent put into common bile duct.   No abdominal pain or change in bowel habits. No new or unexpected weight loss. No other new or acute focal symptoms.  We discussed his recent stomach biopsy done 07/13/2021 which sowed gastropathy and minor chronic gastritis with lymphoid aggregate. Negative for H. Pylori.  We discussed recent cytology done 07/13/2021 which showed presence of malignant cells in pancreas and poorly differentiated/high grade neuroendocrine carcinoma.  We discussed upper endoscopy done 07/13/2021 revealed mass in pancreatic head and  some enlarged lymph nodes were seen in the celiac and peripancreatic regions.  Labs done today were reviewed in detail.  INTERVAL HISTORY:  Leon Liv. Is a 32 y.o. 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 He is here for cycle 9 day 1 of his Folfiri treatment during this visit.   Patient was last seen by PA Thayil on 11/06/2022 and he was doing well overall.   Patient is accompanied by his mother during this visit. Patient notes he has been doing well overall without any new or severe medical concerns since our last visit.   He denies any new infection issues, fever, chills, night sweats, abdominal pain, chest pain, back pain, or leg swelling.   Patient notes he has been tolerating his Folfiri treatment well without any new or severe toxicities.   He has been eating well and has been staying well hydrated.   MEDICAL HISTORY:  Past Medical History:  Diagnosis Date   Asthma    Neuroendocrine cancer (HCC)     SURGICAL HISTORY: Past Surgical History:  Procedure Laterality Date   BILIARY DILATION  07/13/2021   Procedure: BILIARY DILATION;  Surgeon: Vida Rigger, MD;  Location: [facility];  Service: Gastroenterology;;   BILIARY STENT PLACEMENT N/A 07/13/2021   Procedure: BILIARY STENT PLACEMENT;  Surgeon: Vida Rigger, MD;  Location: [name];  Service: Gastroenterology;  Laterality: N/A;   BILIARY STENT PLACEMENT N/A 11/05/2021   Procedure: BILIARY STENT PLACEMENT;  Surgeon: Vida Rigger, MD;  Location: [name];  Service: Gastroenterology;  Laterality: N/A;   BIOPSY  07/13/2021   Procedure: BIOPSY;  Surgeon:  Mansouraty, Netty Starring., MD;  Location: [facility];  Service: Gastroenterology;;   CHOLECYSTECTOMY N/A 11/07/2021   Procedure: LAPAROSCOPIC CHOLECYSTECTOMY;  Surgeon: Berna Bue, MD;  Location: [facility];  Service: General;  Laterality: N/A;   ENDOSCOPIC RETROGRADE CHOLANGIOPANCREATOGRAPHY  (ERCP) WITH PROPOFOL N/A 07/13/2021   Procedure: ENDOSCOPIC RETROGRADE CHOLANGIOPANCREATOGRAPHY (ERCP) WITH PROPOFOL;  Surgeon: Vida Rigger, MD;  Location: [name];  Service: Gastroenterology;  Laterality: N/A;   ERCP N/A 11/05/2021   Procedure: ENDOSCOPIC RETROGRADE CHOLANGIOPANCREATOGRAPHY (ERCP);  Surgeon: Vida Rigger, MD;  Location: [facility];  Service: Gastroenterology;  Laterality: N/A;   ESOPHAGOGASTRODUODENOSCOPY (EGD) WITH PROPOFOL N/A 07/13/2021   Procedure: ESOPHAGOGASTRODUODENOSCOPY (EGD) WITH PROPOFOL;  Surgeon: Meridee Score Netty Starring., MD;  Location: [name];  Service: Gastroenterology;  Laterality: N/A;   EUS N/A 07/13/2021   Procedure: UPPER ENDOSCOPIC ULTRASOUND (EUS) LINEAR;  Surgeon: Lemar Lofty., MD;  Location: [name];  Service: Gastroenterology;  Laterality: N/A;   FINE NEEDLE ASPIRATION  07/13/2021   Procedure: FINE NEEDLE ASPIRATION (FNA) LINEAR;  Surgeon: Lemar Lofty., MD;  Location: [facility];  Service: Gastroenterology;;   IR IMAGING GUIDED PORT INSERTION  07/31/2021   SPHINCTEROTOMY  07/13/2021   Procedure: Dennison Mascot;  Surgeon: Vida Rigger, MD;  Location: [name];  Service: Gastroenterology;;    SOCIAL HISTORY: Social History   Socioeconomic History   Marital status: Single    Spouse name: Not on file   Number of children: 0   Years of education: Not on file   Highest education level: Some college, no degree  Occupational History   Not on file  Tobacco Use   Smoking status: Some Days    Types: Cigarettes   Smokeless tobacco: Never  Vaping Use   Vaping status: Never Used  Substance and Sexual Activity   Alcohol use: Yes    Comment: social   Drug use: No   Sexual activity: Never  Other Topics Concern   Not on file  Social History Narrative   Not on file   Social Determinants of Health   Financial Resource Strain: Low Risk  (04/14/2017)   Overall Financial Resource Strain (CARDIA)    Difficulty of Paying  Living Expenses: Not hard at all  Food Insecurity: No Food Insecurity (11/07/2021)   Hunger Vital Sign    Worried About Running Out of Food in the Last Year: Never true    Ran Out of Food in the Last Year: Never true  Transportation Needs: No Transportation Needs (11/07/2021)   PRAPARE - Administrator, Civil Service (Medical): No    Lack of Transportation (Non-Medical): No  Physical Activity: Inactive (04/14/2017)   Exercise Vital Sign    Days of Exercise per Week: 0 days    Minutes of Exercise per Session: 0 min  Stress: Stress Concern Present (04/14/2017)   [name] of Occupational Health - Occupational Stress Questionnaire    Feeling of Stress : Rather much  Social Connections: Unknown (06/19/2021)   Received from [facility]   Social Network    Social Network: Not on file  Intimate Partner Violence: Not At Risk (11/07/2021)   Humiliation, Afraid, Rape, and Kick questionnaire    Fear of Current or Ex-Partner: No    Emotionally Abused: No    Physically Abused: No    Sexually Abused: No    FAMILY HISTORY: Family History  Problem Relation Age of Onset   Drug abuse Father    ALLERGIES:  is allergic to shellfish allergy and other.  MEDICATIONS:  Current Outpatient Medications  Medication Sig Dispense Refill   acetaminophen (TYLENOL) 500 MG tablet Take 1,000 mg by mouth every 6 (six) hours as needed for mild pain.     albuterol (VENTOLIN HFA) 108 (90 Base) MCG/ACT inhaler Inhale 1-2 puffs into the lungs every 6 (six) hours as needed for wheezing or shortness of breath. 18 g 0   dexamethasone (DECADRON) 4 MG tablet Take 2 tablets (8 mg total) by mouth daily. Start the day after chemotherapy for 2 days. Take with food. 8 tablet 5   lidocaine-prilocaine (EMLA) cream Apply to affected area once 30 g 3   loperamide (IMODIUM) 2 MG capsule Take 2 tabs by mouth with first loose stool, then 1 tab with each additional loose stool as needed. Do not exceed 8  tabs in a 24-hour period 30 capsule 0   ondansetron (ZOFRAN) 8 MG tablet Take 1 tablet (8 mg total) by mouth every 8 (eight) hours as needed for nausea, vomiting or refractory nausea / vomiting. Start on the third day after chemotherapy. 30 tablet 1   oxyCODONE (OXY IR/ROXICODONE) 5 MG immediate release tablet Take 1 tablet (5 mg total) by mouth every 6 (six) hours as needed for severe pain or moderate pain. 20 tablet 0   polyethylene glycol (MIRALAX / GLYCOLAX) 17 g packet Take 17 g by mouth daily. 14 each 0   prochlorperazine (COMPAZINE) 10 MG tablet Take 1 tablet (10 mg total) by mouth every 6 (six) hours as needed for nausea or vomiting. 30 tablet 1   senna-docusate (SENOKOT-S) 8.6-50 MG tablet Take 2 tablets by mouth 2 (two) times daily. 30 tablet 0   No current facility-administered medications for this visit.    REVIEW OF SYSTEMS:    10 Point review of Systems was done is negative except as noted above.   PHYSICAL EXAMINATION: ECOG PERFORMANCE STATUS: 1 - Symptomatic but completely ambulatory .BP 124/89   Pulse 82   Temp 99 F (37.2 C)   Resp 20   Wt 297 lb 9.6 oz (135 kg)   SpO2 100%   BMI 40.36 kg/m  GENERAL:alert, in no acute distress and comfortable SKIN: no acute rashes, no significant lesions EYES: conjunctiva are pink and non-injected, sclera anicteric OROPHARYNX: MMM, no exudates, no oropharyngeal erythema or ulceration NECK: supple, no JVD LYMPH:  no palpable lymphadenopathy in the cervical, axillary or inguinal regions LUNGS: clear to auscultation b/l with normal respiratory effort HEART: regular rate & rhythm ABDOMEN:  normoactive bowel sounds , non tender, not distended. Extremity: no pedal edema PSYCH: alert & oriented x 3 with fluent speech NEURO: no focal motor/sensory deficits   LABORATORY DATA:  I have reviewed the data as listed    Latest Ref Rng & Units 11/19/2022   11:48 AM 11/06/2022   12:15 PM 10/23/2022   11:40 AM  CBC  WBC 4.0 - 10.5 K/uL 3.4   3.6  3.6   Hemoglobin 13.0 - 17.0 g/dL 16.1  09.6  04.5   Hematocrit 39.0 - 52.0 % 34.0  31.3  33.5   Platelets 150 - 400 K/uL 151  117  150       Latest Ref Rng & Units 11/19/2022   11:48 AM 11/06/2022   12:15 PM 10/23/2022   11:40 AM  CMP  Glucose 70 - 99 mg/dL 98  409  96   BUN 6 - 20 mg/dL 10  11  12    Creatinine 0.61 - 1.24 mg/dL 8.11  9.14  0.85   Sodium 135 - 145 mmol/L 140  139  140   Potassium 3.5 - 5.1 mmol/L 3.6  3.7  3.8   Chloride 98 - 111 mmol/L 108  107  109   CO2 22 - 32 mmol/L 26  26  27    Calcium 8.9 - 10.3 mg/dL 9.6  9.2  9.3   Total Protein 6.5 - 8.1 g/dL 7.5  7.0  7.1   Total Bilirubin 0.3 - 1.2 mg/dL 0.9  0.7  0.8   Alkaline Phos 38 - 126 U/L 98  92  79   AST 15 - 41 U/L 25  21  18    ALT 0 - 44 U/L 26  23  18     Cytology done 07/13/2021 revealed "FINAL MICROSCOPIC DIAGNOSIS:  A. PANCREAS, HEAD, FINE NEEDLE ASPIRATION:  - Malignant cells present  - Poorly differentiated/high-grade neuroendocrine carcinoma (see  comment)   B. CBD STRICTURE, BRUSHING:  - Atypical cells suspicious for tumor   C. BILIARY DILATION, BALLOON, REMOVAL:  - Atypical cells suspicious for tumor "  Surgical pathology done 07/13/2021 revealed "FINAL MICROSCOPIC DIAGNOSIS:   A. STOMACH, BIOPSY:  Reactive gastropathy and minimal chronic gastritis with lymphoid  aggregate  Negative for H. pylori, intestinal metaplasia, dysplasia and carcinoma"   Pathology 11/07/21: FINAL MICROSCOPIC DIAGNOSIS:   A. GALLBLADDER, CHOLECYSTECTOMY:  - Acute cholecystitis with necrosis and abscess.   RADIOGRAPHIC STUDIES: I have personally reviewed the radiological images as listed and agreed with the findings in the report. No results found.   ASSESSMENT & PLAN:   32 y.o. very pleasant male with  1. 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 -Cytology done 07/13/2021 shows presence of malignant cells in  pancreas and poorly differentiated/high grade neuroendocrine carcinoma.  PLAN: -Discussed lab results from today, 11/19/2022, in detail with the patient. CBC shows decreased WBC of 3.4 K, decreased hemoglobin of 11.3 g/dL, and decreased hematocrit of 34.0%. Cmp wnl -no indication to change treatment at this time.  -Patient tolerated the last cycle of his Folfiri treatment without any new or notable toxicities.  -will continue treatment every 2 weeks at this time. .  -Patient can proceed with cycle 9 of Folfiri without any dose modification.   -answered all of patient's and his mother's questions regarding treatment in detail -Repeat scan most likely end of December - beginning of January.   FOLLOW-UP:  Per integrated scheduling  The total time spent in the appointment was 23 minutes* .  All of the patient's questions were answered with apparent satisfaction. The patient knows to call the clinic with any problems, questions or concerns.   Wyvonnia Lora MD MS AAHIVMS [facility] Hematology/Oncology Physician [facility]  .*Total Encounter Time as defined by the Centers for Medicare and Medicaid Services includes, in addition to the face-to-face time of a patient visit (documented in the note above) non-face-to-face time: obtaining and reviewing outside history, ordering and reviewing medications, tests or procedures, care coordination (communications with other health care professionals or caregivers) and documentation in the medical record.   I,Leon Fischer,acting as a Neurosurgeon for Wyvonnia Lora, MD.,have documented all relevant documentation on the behalf of Wyvonnia Lora, MD,as directed by  Wyvonnia Lora, MD while in the presence of Wyvonnia Lora, MD.

## 2022-11-19 NOTE — Progress Notes (Signed)
Patient seen by Dr. Kale  Vitals are within treatment parameters.  Labs reviewed: and are within treatment parameters.  Per physician team, patient is ready for treatment and there are NO modifications to the treatment plan.  

## 2022-11-21 ENCOUNTER — Inpatient Hospital Stay: Payer: Medicaid Other

## 2022-11-21 VITALS — BP 122/79 | HR 82 | Temp 98.7°F | Resp 18

## 2022-11-21 DIAGNOSIS — C7A1 Malignant poorly differentiated neuroendocrine tumors: Secondary | ICD-10-CM

## 2022-11-21 DIAGNOSIS — Z7189 Other specified counseling: Secondary | ICD-10-CM

## 2022-11-21 DIAGNOSIS — Z5111 Encounter for antineoplastic chemotherapy: Secondary | ICD-10-CM | POA: Diagnosis not present

## 2022-11-21 MED ORDER — SODIUM CHLORIDE 0.9% FLUSH
10.0000 mL | INTRAVENOUS | Status: DC | PRN
  Administered 2022-11-21: 10 mL

## 2022-11-21 MED ORDER — HEPARIN SOD (PORK) LOCK FLUSH 100 UNIT/ML IV SOLN
500.0000 [IU] | Freq: Once | INTRAVENOUS | Status: AC | PRN
  Administered 2022-11-21: 500 [IU]

## 2022-11-25 ENCOUNTER — Encounter: Payer: Self-pay | Admitting: Hematology

## 2022-12-04 ENCOUNTER — Inpatient Hospital Stay: Payer: Medicaid Other

## 2022-12-04 ENCOUNTER — Inpatient Hospital Stay (HOSPITAL_BASED_OUTPATIENT_CLINIC_OR_DEPARTMENT_OTHER): Payer: Medicaid Other | Admitting: Hematology

## 2022-12-04 VITALS — HR 92

## 2022-12-04 DIAGNOSIS — Z7189 Other specified counseling: Secondary | ICD-10-CM

## 2022-12-04 DIAGNOSIS — C7A1 Malignant poorly differentiated neuroendocrine tumors: Secondary | ICD-10-CM | POA: Diagnosis not present

## 2022-12-04 DIAGNOSIS — Z5111 Encounter for antineoplastic chemotherapy: Secondary | ICD-10-CM | POA: Diagnosis not present

## 2022-12-04 DIAGNOSIS — Z95828 Presence of other vascular implants and grafts: Secondary | ICD-10-CM

## 2022-12-04 LAB — CMP (CANCER CENTER ONLY)
ALT: 18 U/L (ref 0–44)
AST: 18 U/L (ref 15–41)
Albumin: 4 g/dL (ref 3.5–5.0)
Alkaline Phosphatase: 88 U/L (ref 38–126)
Anion gap: 8 (ref 5–15)
BUN: 6 mg/dL (ref 6–20)
CO2: 25 mmol/L (ref 22–32)
Calcium: 9.2 mg/dL (ref 8.9–10.3)
Chloride: 107 mmol/L (ref 98–111)
Creatinine: 0.9 mg/dL (ref 0.61–1.24)
GFR, Estimated: >60 mL/min (ref >60)
Glucose, Bld: 169 mg/dL — ABNORMAL HIGH (ref 70–99)
Potassium: 3.1 mmol/L — ABNORMAL LOW (ref 3.5–5.1)
Sodium: 140 mmol/L (ref 135–145)
Total Bilirubin: 0.8 mg/dL (ref 0.3–1.2)
Total Protein: 7 g/dL (ref 6.5–8.1)

## 2022-12-04 LAB — CBC WITH DIFFERENTIAL (CANCER CENTER ONLY)
Abs Immature Granulocytes: 0.01 10*3/uL (ref 0.00–0.07)
Basophils Absolute: 0 10*3/uL (ref 0.0–0.1)
Basophils Relative: 0 %
Eosinophils Absolute: 0.1 10*3/uL (ref 0.0–0.5)
Eosinophils Relative: 5 %
HCT: 31 % — ABNORMAL LOW (ref 39.0–52.0)
Hemoglobin: 10.3 g/dL — ABNORMAL LOW (ref 13.0–17.0)
Immature Granulocytes: 0 %
Lymphocytes Relative: 18 %
Lymphs Abs: 0.4 10*3/uL — ABNORMAL LOW (ref 0.7–4.0)
MCH: 29 pg (ref 26.0–34.0)
MCHC: 33.2 g/dL (ref 30.0–36.0)
MCV: 87.3 fL (ref 80.0–100.0)
Monocytes Absolute: 0.3 10*3/uL (ref 0.1–1.0)
Monocytes Relative: 13 %
Neutro Abs: 1.6 10*3/uL — ABNORMAL LOW (ref 1.7–7.7)
Neutrophils Relative %: 64 %
Platelet Count: 157 10*3/uL (ref 150–400)
RBC: 3.55 MIL/uL — ABNORMAL LOW (ref 4.22–5.81)
RDW: 15 % (ref 11.5–15.5)
WBC Count: 2.5 10*3/uL — ABNORMAL LOW (ref 4.0–10.5)
nRBC: 0 % (ref 0.0–0.2)

## 2022-12-04 MED ORDER — PALONOSETRON HCL INJECTION 0.25 MG/5ML
0.2500 mg | Freq: Once | INTRAVENOUS | Status: AC
  Administered 2022-12-04: 0.25 mg via INTRAVENOUS
  Filled 2022-12-04: qty 5

## 2022-12-04 MED ORDER — SODIUM CHLORIDE 0.9% FLUSH: 10.0000 mL | INTRAVENOUS | Status: DC | PRN

## 2022-12-04 MED ORDER — ATROPINE SULFATE 1 MG/ML IV SOLN
0.5000 mg | Freq: Once | INTRAVENOUS | Status: AC | PRN
  Administered 2022-12-04: 0.5 mg via INTRAVENOUS
  Filled 2022-12-04: qty 1

## 2022-12-04 MED ORDER — SODIUM CHLORIDE 0.9 % IV SOLN
180.0000 mg/m2 | Freq: Once | INTRAVENOUS | Status: AC
  Administered 2022-12-04: 460 mg via INTRAVENOUS
  Filled 2022-12-04: qty 15

## 2022-12-04 MED ORDER — SODIUM CHLORIDE 0.9% FLUSH
10.0000 mL | Freq: Once | INTRAVENOUS | Status: AC
  Administered 2022-12-04: 10 mL

## 2022-12-04 MED ORDER — SODIUM CHLORIDE 0.9 % IV SOLN: Freq: Once | INTRAVENOUS | Status: AC

## 2022-12-04 MED ORDER — HEPARIN SOD (PORK) LOCK FLUSH 100 UNIT/ML IV SOLN
500.0000 [IU] | Freq: Once | INTRAVENOUS | Status: DC | PRN
Start: 2022-12-04 — End: 2022-12-04

## 2022-12-04 MED ORDER — DEXAMETHASONE SODIUM PHOSPHATE 10 MG/ML IJ SOLN
10.0000 mg | Freq: Once | INTRAMUSCULAR | Status: AC
  Administered 2022-12-04: 10 mg via INTRAVENOUS
  Filled 2022-12-04: qty 1

## 2022-12-04 MED ORDER — SODIUM CHLORIDE 0.9 % IV SOLN
400.0000 mg/m2 | Freq: Once | INTRAVENOUS | Status: AC
  Administered 2022-12-04: 1020 mg via INTRAVENOUS
  Filled 2022-12-04: qty 50

## 2022-12-04 MED ORDER — SODIUM CHLORIDE 0.9 % IV SOLN
2400.0000 mg/m2 | INTRAVENOUS | Status: DC
  Administered 2022-12-04: 6100 mg via INTRAVENOUS
  Filled 2022-12-04: qty 122

## 2022-12-04 MED ORDER — FLUOROURACIL CHEMO INJECTION 2.5 GM/50ML
400.0000 mg/m2 | Freq: Once | INTRAVENOUS | Status: AC
  Administered 2022-12-04: 1000 mg via INTRAVENOUS
  Filled 2022-12-04: qty 20

## 2022-12-04 NOTE — Progress Notes (Signed)
Patient has a cold today- no fever. Saw Dr. Candise Che, but did not mention it. Discussed with Dr. Candise Che- ok to treat today.

## 2022-12-04 NOTE — Progress Notes (Signed)
Patient seen by Dr. Addison Naegeli are within treatment parameters.  Labs reviewed: and are within treatment parameters./ Dr Candise Che aware K: 3.1  Per physician team, patient is ready for treatment and there are NO modifications to the treatment plan.

## 2022-12-04 NOTE — Patient Instructions (Signed)
[place] [facility] AT [facility]  Discharge Instructions: Thank you for choosing [facility] to provide your oncology and hematology care.   If you have a lab appointment with the Cancer Center, please go directly to the Cancer Center and check in at the registration area.   Wear comfortable clothing and clothing appropriate for easy access to any Portacath or PICC line.   We strive to give you quality time with your provider. You may need to reschedule your appointment if you arrive late (15 or more minutes).  Arriving late affects you and other patients whose appointments are after yours.  Also, if you miss three or more appointments without notifying the office, you may be dismissed from the clinic at the provider's discretion.      For prescription refill requests, have your pharmacy contact our office and allow 72 hours for refills to be completed.    Today you received the following chemotherapy and/or immunotherapy agents Irinotecan / Leucovorin / Fluorouracil      To help prevent nausea and vomiting after your treatment, we encourage you to take your nausea medication as directed.  BELOW ARE SYMPTOMS THAT SHOULD BE REPORTED IMMEDIATELY: *FEVER GREATER THAN 100.4 F (38 C) OR HIGHER *CHILLS OR SWEATING *NAUSEA AND VOMITING THAT IS NOT CONTROLLED WITH YOUR NAUSEA MEDICATION *UNUSUAL SHORTNESS OF BREATH *UNUSUAL BRUISING OR BLEEDING *URINARY PROBLEMS (pain or burning when urinating, or frequent urination) *BOWEL PROBLEMS (unusual diarrhea, constipation, pain near the anus) TENDERNESS IN MOUTH AND THROAT WITH OR WITHOUT PRESENCE OF ULCERS (sore throat, sores in mouth, or a toothache) UNUSUAL RASH, SWELLING OR PAIN  UNUSUAL VAGINAL DISCHARGE OR ITCHING   Items with * indicate a potential emergency and should be followed up as soon as possible or go to the Emergency Department if any problems should occur.  Please show the CHEMOTHERAPY ALERT CARD or  IMMUNOTHERAPY ALERT CARD at check-in to the Emergency Department and triage nurse.  Should you have questions after your visit or need to cancel or reschedule your appointment, please contact [place] [facility]  Dept: [phone]  and follow the prompts.  Office hours are 8:00 a.m. to 4:30 p.m. Monday - Friday. Please note that voicemails left after 4:00 p.m. may not be returned until the following business day.  We are closed weekends and major holidays. You have access to a nurse at all times for urgent questions. Please call the main number to the clinic Dept: [phone] and follow the prompts.   For any non-urgent questions, you may also contact your provider using MyChart. We now offer e-Visits for anyone 74 and older to request care online for non-urgent symptoms. For details visit [email or web address].   Also download the MyChart app! Go to the app store, search "MyChart", open the app, select [place], and log in with your MyChart username and password.

## 2022-12-04 NOTE — Progress Notes (Signed)
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  HISTORY OF PRESENTING ILLNESS:  Leon Prajapati. is a wonderful 32 y.o. 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 He presents today accompanied by his mother. He reports He is doing well.  He reports no color change in stool.  He notes that he is eating well but feels food just sits in stomach.  We discussed getting additional scans and labs for further evaluation and treatment which he was agreeable to. We further discussed getting MRI of brain and PET scan which he was agreeable to.  He notes that he is trying to hold it together mentally and is still trying to process his new diagnosis. We discussed the options of psychological or spiritual counseling which he has not decided on at this time. He was advised that may call back if he decides that he would like to pursue either.  We discussed getting a port a cath installed which he was agreeable to.  He recently had a biliary stent put into common bile duct.   No abdominal pain or change in bowel habits. No new or unexpected weight loss. No other new or acute focal symptoms.  We discussed his recent stomach biopsy done 07/13/2021 which sowed gastropathy and minor chronic gastritis with lymphoid aggregate. Negative for H. Pylori.  We discussed recent cytology done 07/13/2021 which showed presence of malignant cells in pancreas and poorly differentiated/high grade neuroendocrine carcinoma.  We discussed upper endoscopy done 07/13/2021 revealed mass in pancreatic head and  some enlarged lymph nodes were seen in the celiac and peripancreatic regions.  Labs done today were reviewed in detail.  INTERVAL HISTORY:  Leon Liv. Is a 32 y.o. 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 He is here for cycle 10 day 1 of his Folfiri treatment during this visit.   Patient was last seen by me on 11/19/2022 and was doing well overall without any new medical concerns.  Today, he is accompanied by her mother. Patient notes he has been tolerating his Folfiri treatment well without any new or severe toxicities. He denies any abdominal pain, change in diet, change in energy levels, or other side effects that are new or different from his treatment.   Patient's mother notes that patient did need to use his inhaler today.   MEDICAL HISTORY:  Past Medical History:  Diagnosis Date   Asthma    Neuroendocrine cancer (HCC)     SURGICAL HISTORY: Past Surgical History:  Procedure Laterality Date   BILIARY DILATION  07/13/2021   Procedure: BILIARY DILATION;  Surgeon: Vida Rigger, MD;  Location: [facility];  Service: Gastroenterology;;   BILIARY STENT PLACEMENT N/A 07/13/2021   Procedure: BILIARY STENT PLACEMENT;  Surgeon: Vida Rigger, MD;  Location: [name];  Service: Gastroenterology;  Laterality: N/A;   BILIARY STENT PLACEMENT N/A 11/05/2021   Procedure: BILIARY STENT PLACEMENT;  Surgeon: Vida Rigger, MD;  Location: [name];  Service: Gastroenterology;  Laterality: N/A;   BIOPSY  07/13/2021   Procedure: BIOPSY;  Surgeon: Lemar Lofty., MD;  Location: [facility];  Service: Gastroenterology;;   CHOLECYSTECTOMY N/A 11/07/2021  Procedure: LAPAROSCOPIC CHOLECYSTECTOMY;  Surgeon: Berna Bue, MD;  Location: [facility];  Service: General;  Laterality: N/A;   ENDOSCOPIC RETROGRADE CHOLANGIOPANCREATOGRAPHY (ERCP) WITH PROPOFOL N/A 07/13/2021   Procedure: ENDOSCOPIC RETROGRADE  CHOLANGIOPANCREATOGRAPHY (ERCP) WITH PROPOFOL;  Surgeon: Vida Rigger, MD;  Location: [name];  Service: Gastroenterology;  Laterality: N/A;   ERCP N/A 11/05/2021   Procedure: ENDOSCOPIC RETROGRADE CHOLANGIOPANCREATOGRAPHY (ERCP);  Surgeon: Vida Rigger, MD;  Location: [facility];  Service: Gastroenterology;  Laterality: N/A;   ESOPHAGOGASTRODUODENOSCOPY (EGD) WITH PROPOFOL N/A 07/13/2021   Procedure: ESOPHAGOGASTRODUODENOSCOPY (EGD) WITH PROPOFOL;  Surgeon: Meridee Score Netty Starring., MD;  Location: [name];  Service: Gastroenterology;  Laterality: N/A;   EUS N/A 07/13/2021   Procedure: UPPER ENDOSCOPIC ULTRASOUND (EUS) LINEAR;  Surgeon: Lemar Lofty., MD;  Location: [name];  Service: Gastroenterology;  Laterality: N/A;   FINE NEEDLE ASPIRATION  07/13/2021   Procedure: FINE NEEDLE ASPIRATION (FNA) LINEAR;  Surgeon: Lemar Lofty., MD;  Location: [facility];  Service: Gastroenterology;;   IR IMAGING GUIDED PORT INSERTION  07/31/2021   SPHINCTEROTOMY  07/13/2021   Procedure: Dennison Mascot;  Surgeon: Vida Rigger, MD;  Location: [name];  Service: Gastroenterology;;    SOCIAL HISTORY: Social History   Socioeconomic History   Marital status: Single    Spouse name: Not on file   Number of children: 0   Years of education: Not on file   Highest education level: Some college, no degree  Occupational History   Not on file  Tobacco Use   Smoking status: Some Days    Types: Cigarettes   Smokeless tobacco: Never  Vaping Use   Vaping status: Never Used  Substance and Sexual Activity   Alcohol use: Yes    Comment: social   Drug use: No   Sexual activity: Never  Other Topics Concern   Not on file  Social History Narrative   Not on file   Social Determinants of Health   Financial Resource Strain: Low Risk  (04/14/2017)   Overall Financial Resource Strain (CARDIA)    Difficulty of Paying Living Expenses: Not hard at all  Food Insecurity: No Food Insecurity  (11/07/2021)   Hunger Vital Sign    Worried About Running Out of Food in the Last Year: Never true    Ran Out of Food in the Last Year: Never true  Transportation Needs: No Transportation Needs (11/07/2021)   PRAPARE - Administrator, Civil Service (Medical): No    Lack of Transportation (Non-Medical): No  Physical Activity: Inactive (04/14/2017)   Exercise Vital Sign    Days of Exercise per Week: 0 days    Minutes of Exercise per Session: 0 min  Stress: Stress Concern Present (04/14/2017)   [name] of Occupational Health - Occupational Stress Questionnaire    Feeling of Stress : Rather much  Social Connections: Unknown (06/19/2021)   Received from [facility]   Social Network    Social Network: Not on file  Intimate Partner Violence: Not At Risk (11/07/2021)   Humiliation, Afraid, Rape, and Kick questionnaire    Fear of Current or Ex-Partner: No    Emotionally Abused: No    Physically Abused: No    Sexually Abused: No    FAMILY HISTORY: Family History  Problem Relation Age of Onset   Drug abuse Father    ALLERGIES:  is allergic to shellfish allergy and other.  MEDICATIONS:  Current Outpatient Medications  Medication Sig Dispense Refill   acetaminophen (TYLENOL) 500 MG tablet  Take 1,000 mg by mouth every 6 (six) hours as needed for mild pain.     albuterol (VENTOLIN HFA) 108 (90 Base) MCG/ACT inhaler Inhale 1-2 puffs into the lungs every 6 (six) hours as needed for wheezing or shortness of breath. 18 g 0   dexamethasone (DECADRON) 4 MG tablet Take 2 tablets (8 mg total) by mouth daily. Start the day after chemotherapy for 2 days. Take with food. 8 tablet 5   lidocaine-prilocaine (EMLA) cream Apply to affected area once 30 g 3   loperamide (IMODIUM) 2 MG capsule Take 2 tabs by mouth with first loose stool, then 1 tab with each additional loose stool as needed. Do not exceed 8 tabs in a 24-hour period 30 capsule 0   ondansetron (ZOFRAN) 8 MG  tablet Take 1 tablet (8 mg total) by mouth every 8 (eight) hours as needed for nausea, vomiting or refractory nausea / vomiting. Start on the third day after chemotherapy. 30 tablet 1   oxyCODONE (OXY IR/ROXICODONE) 5 MG immediate release tablet Take 1 tablet (5 mg total) by mouth every 6 (six) hours as needed for severe pain or moderate pain. 20 tablet 0   polyethylene glycol (MIRALAX / GLYCOLAX) 17 g packet Take 17 g by mouth daily. 14 each 0   prochlorperazine (COMPAZINE) 10 MG tablet Take 1 tablet (10 mg total) by mouth every 6 (six) hours as needed for nausea or vomiting. 30 tablet 1   senna-docusate (SENOKOT-S) 8.6-50 MG tablet Take 2 tablets by mouth 2 (two) times daily. 30 tablet 0   No current facility-administered medications for this visit.    REVIEW OF SYSTEMS:    10 Point review of Systems was done is negative except as noted above.   PHYSICAL EXAMINATION: ECOG PERFORMANCE STATUS: 1 - Symptomatic but completely ambulatory .BP (!) 139/98   Pulse (!) 103   Temp 97.7 F (36.5 C)   Resp 20   Wt (!) 306 lb (138.8 kg)   SpO2 100%   BMI 41.50 kg/m    GENERAL:alert, in no acute distress and comfortable SKIN: no acute rashes, no significant lesions EYES: conjunctiva are pink and non-injected, sclera anicteric OROPHARYNX: MMM, no exudates, no oropharyngeal erythema or ulceration NECK: supple, no JVD LYMPH:  no palpable lymphadenopathy in the cervical, axillary or inguinal regions LUNGS: clear to auscultation b/l with normal respiratory effort HEART: regular rate & rhythm ABDOMEN:  normoactive bowel sounds , non tender, not distended. Extremity: no pedal edema PSYCH: alert & oriented x 3 with fluent speech NEURO: no focal motor/sensory deficits    LABORATORY DATA:  I have reviewed the data as listed    Latest Ref Rng & Units 12/04/2022    9:28 AM 11/19/2022   11:48 AM 11/06/2022   12:15 PM  CBC  WBC 4.0 - 10.5 K/uL 2.5  3.4  3.6   Hemoglobin 13.0 - 17.0 g/dL 42.5   95.6  38.7   Hematocrit 39.0 - 52.0 % 31.0  34.0  31.3   Platelets 150 - 400 K/uL 157  151  117       Latest Ref Rng & Units 11/19/2022   11:48 AM 11/06/2022   12:15 PM 10/23/2022   11:40 AM  CMP  Glucose 70 - 99 mg/dL 98  564  96   BUN 6 - 20 mg/dL 10  11  12    Creatinine 0.61 - 1.24 mg/dL 3.32  9.51  8.84   Sodium 135 - 145 mmol/L 140  139  140   Potassium 3.5 - 5.1 mmol/L 3.6  3.7  3.8   Chloride 98 - 111 mmol/L 108  107  109   CO2 22 - 32 mmol/L 26  26  27    Calcium 8.9 - 10.3 mg/dL 9.6  9.2  9.3   Total Protein 6.5 - 8.1 g/dL 7.5  7.0  7.1   Total Bilirubin 0.3 - 1.2 mg/dL 0.9  0.7  0.8   Alkaline Phos 38 - 126 U/L 98  92  79   AST 15 - 41 U/L 25  21  18    ALT 0 - 44 U/L 26  23  18     Cytology done 07/13/2021 revealed "FINAL MICROSCOPIC DIAGNOSIS:  A. PANCREAS, HEAD, FINE NEEDLE ASPIRATION:  - Malignant cells present  - Poorly differentiated/high-grade neuroendocrine carcinoma (see  comment)   B. CBD STRICTURE, BRUSHING:  - Atypical cells suspicious for tumor   C. BILIARY DILATION, BALLOON, REMOVAL:  - Atypical cells suspicious for tumor "  Surgical pathology done 07/13/2021 revealed "FINAL MICROSCOPIC DIAGNOSIS:   A. STOMACH, BIOPSY:  Reactive gastropathy and minimal chronic gastritis with lymphoid  aggregate  Negative for H. pylori, intestinal metaplasia, dysplasia and carcinoma"   Pathology 11/07/21: FINAL MICROSCOPIC DIAGNOSIS:   A. GALLBLADDER, CHOLECYSTECTOMY:  - Acute cholecystitis with necrosis and abscess.   RADIOGRAPHIC STUDIES: I have personally reviewed the radiological images as listed and agreed with the findings in the report. No results found.   ASSESSMENT & PLAN:   32 y.o. very pleasant male with  1. 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 -Cytology done 07/13/2021 shows presence of malignant cells in pancreas and poorly differentiated/high grade  neuroendocrine carcinoma.  PLAN:  -Discussed lab results on 12/04/22 in detail with patient. CBC showed WBC of 2.5K, hemoglobin of 10.3, and platelets of 157K. -Mild anemia with hgb 10.3 -Some leukopenia but no neutropenia -platelets normal -CMP is stable other than K 3.1. Patient counseling to increase intake of potassium rich foods. If still low with next labs would need to start po potassium supplementations. -his last CT chest/abdomen/pelvis scan on 10/17/2022 was stable with no new findings suggestive of changing treatment at this time. There were findings of several spots which were stable.  -Patient tolerated the last cycle of his Folfiri treatment without any new or notable toxicities.  -Patient can proceed with cycle 10 day 1 of Folfiri without any dose modification.   -answered all of patient's and his mother's questions in detail  FOLLOW-UP: Per integrated scheduling  The total time spent in the appointment was 23 minutes* .  All of the patient's questions were answered with apparent satisfaction. The patient knows to call the clinic with any problems, questions or concerns.   Wyvonnia Lora MD MS AAHIVMS [facility] Hematology/Oncology Physician [facility]  .*Total Encounter Time as defined by the Centers for Medicare and Medicaid Services includes, in addition to the face-to-face time of a patient visit (documented in the note above) non-face-to-face time: obtaining and reviewing outside history, ordering and reviewing medications, tests or procedures, care coordination (communications with other health care professionals or caregivers) and documentation in the medical record.    I,Leon Fischer,acting as a Neurosurgeon for Wyvonnia Lora, MD.,have documented all relevant documentation on the behalf of Wyvonnia Lora, MD,as directed by  Wyvonnia Lora, MD while in the presence of Wyvonnia Lora, MD.  .I have reviewed the above documentation for accuracy and  completeness, and I agree  with the above. [name] MD

## 2022-12-06 ENCOUNTER — Inpatient Hospital Stay: Payer: Medicaid Other | Attending: Hematology

## 2022-12-06 ENCOUNTER — Other Ambulatory Visit: Payer: Self-pay

## 2022-12-06 DIAGNOSIS — C7A1 Malignant poorly differentiated neuroendocrine tumors: Secondary | ICD-10-CM | POA: Diagnosis not present

## 2022-12-06 DIAGNOSIS — Z7189 Other specified counseling: Secondary | ICD-10-CM

## 2022-12-06 DIAGNOSIS — Z5111 Encounter for antineoplastic chemotherapy: Secondary | ICD-10-CM | POA: Insufficient documentation

## 2022-12-06 DIAGNOSIS — C7B8 Other secondary neuroendocrine tumors: Secondary | ICD-10-CM | POA: Diagnosis not present

## 2022-12-06 DIAGNOSIS — Z95828 Presence of other vascular implants and grafts: Secondary | ICD-10-CM

## 2022-12-06 MED ORDER — HEPARIN SOD (PORK) LOCK FLUSH 100 UNIT/ML IV SOLN
250.0000 [IU] | Freq: Once | INTRAVENOUS | Status: AC
  Administered 2022-12-06: 250 [IU]

## 2022-12-06 MED ORDER — SODIUM CHLORIDE 0.9% FLUSH
10.0000 mL | Freq: Once | INTRAVENOUS | Status: AC
  Administered 2022-12-06: 10 mL

## 2022-12-10 ENCOUNTER — Encounter: Payer: Self-pay | Admitting: Hematology

## 2022-12-18 ENCOUNTER — Inpatient Hospital Stay (HOSPITAL_BASED_OUTPATIENT_CLINIC_OR_DEPARTMENT_OTHER): Payer: Medicaid Other | Admitting: Hematology

## 2022-12-18 ENCOUNTER — Inpatient Hospital Stay: Payer: Medicaid Other

## 2022-12-18 ENCOUNTER — Encounter: Payer: Self-pay | Admitting: Hematology

## 2022-12-18 VITALS — BP 128/92 | HR 85 | Temp 97.7°F | Resp 20 | Wt 304.9 lb

## 2022-12-18 DIAGNOSIS — Z7189 Other specified counseling: Secondary | ICD-10-CM

## 2022-12-18 DIAGNOSIS — Z5111 Encounter for antineoplastic chemotherapy: Secondary | ICD-10-CM | POA: Diagnosis not present

## 2022-12-18 DIAGNOSIS — C7A1 Malignant poorly differentiated neuroendocrine tumors: Secondary | ICD-10-CM | POA: Diagnosis not present

## 2022-12-18 DIAGNOSIS — Z95828 Presence of other vascular implants and grafts: Secondary | ICD-10-CM

## 2022-12-18 LAB — CMP (CANCER CENTER ONLY)
ALT: 15 U/L (ref 0–44)
AST: 18 U/L (ref 15–41)
Albumin: 4 g/dL (ref 3.5–5.0)
Alkaline Phosphatase: 89 U/L (ref 38–126)
Anion gap: 5 (ref 5–15)
BUN: 14 mg/dL (ref 6–20)
CO2: 28 mmol/L (ref 22–32)
Calcium: 9.4 mg/dL (ref 8.9–10.3)
Chloride: 108 mmol/L (ref 98–111)
Creatinine: 0.79 mg/dL (ref 0.61–1.24)
GFR, Estimated: >60 mL/min (ref >60)
Glucose, Bld: 98 mg/dL (ref 70–99)
Potassium: 3.7 mmol/L (ref 3.5–5.1)
Sodium: 141 mmol/L (ref 135–145)
Total Bilirubin: 0.8 mg/dL (ref <1.2)
Total Protein: 7.1 g/dL (ref 6.5–8.1)

## 2022-12-18 LAB — CBC WITH DIFFERENTIAL (CANCER CENTER ONLY)
Abs Immature Granulocytes: 0.01 10*3/uL (ref 0.00–0.07)
Basophils Absolute: 0 10*3/uL (ref 0.0–0.1)
Basophils Relative: 0 %
Eosinophils Absolute: 0.2 10*3/uL (ref 0.0–0.5)
Eosinophils Relative: 6 %
HCT: 31.7 % — ABNORMAL LOW (ref 39.0–52.0)
Hemoglobin: 10.6 g/dL — ABNORMAL LOW (ref 13.0–17.0)
Immature Granulocytes: 0 %
Lymphocytes Relative: 22 %
Lymphs Abs: 0.6 10*3/uL — ABNORMAL LOW (ref 0.7–4.0)
MCH: 29 pg (ref 26.0–34.0)
MCHC: 33.4 g/dL (ref 30.0–36.0)
MCV: 86.6 fL (ref 80.0–100.0)
Monocytes Absolute: 0.4 10*3/uL (ref 0.1–1.0)
Monocytes Relative: 17 %
Neutro Abs: 1.4 10*3/uL — ABNORMAL LOW (ref 1.7–7.7)
Neutrophils Relative %: 55 %
Platelet Count: 136 10*3/uL — ABNORMAL LOW (ref 150–400)
RBC: 3.66 MIL/uL — ABNORMAL LOW (ref 4.22–5.81)
RDW: 15.7 % — ABNORMAL HIGH (ref 11.5–15.5)
WBC Count: 2.6 10*3/uL — ABNORMAL LOW (ref 4.0–10.5)
nRBC: 0 % (ref 0.0–0.2)

## 2022-12-18 MED ORDER — ATROPINE SULFATE 1 MG/ML IV SOLN
0.5000 mg | Freq: Once | INTRAVENOUS | Status: AC | PRN
  Administered 2022-12-18: 0.5 mg via INTRAVENOUS

## 2022-12-18 MED ORDER — PALONOSETRON HCL INJECTION 0.25 MG/5ML
0.2500 mg | Freq: Once | INTRAVENOUS | Status: AC
  Administered 2022-12-18: 0.25 mg via INTRAVENOUS

## 2022-12-18 MED ORDER — SODIUM CHLORIDE 0.9% FLUSH
10.0000 mL | Freq: Once | INTRAVENOUS | Status: AC
  Administered 2022-12-18: 10 mL

## 2022-12-18 MED ORDER — SODIUM CHLORIDE 0.9 % IV SOLN: Freq: Once | INTRAVENOUS | Status: AC

## 2022-12-18 MED ORDER — IRINOTECAN HCL CHEMO INJECTION 100 MG/5ML
180.0000 mg/m2 | Freq: Once | INTRAVENOUS | Status: AC
  Administered 2022-12-18: 460 mg via INTRAVENOUS
  Filled 2022-12-18: qty 15

## 2022-12-18 MED ORDER — DEXAMETHASONE SODIUM PHOSPHATE 10 MG/ML IJ SOLN
10.0000 mg | Freq: Once | INTRAMUSCULAR | Status: AC
  Administered 2022-12-18: 10 mg via INTRAVENOUS

## 2022-12-18 MED ORDER — FLUOROURACIL CHEMO INJECTION 2.5 GM/50ML
400.0000 mg/m2 | Freq: Once | INTRAVENOUS | Status: AC
  Administered 2022-12-18: 1000 mg via INTRAVENOUS
  Filled 2022-12-18: qty 20

## 2022-12-18 MED ORDER — SODIUM CHLORIDE 0.9 % IV SOLN
400.0000 mg/m2 | Freq: Once | INTRAVENOUS | Status: AC
  Administered 2022-12-18: 1020 mg via INTRAVENOUS
  Filled 2022-12-18: qty 50

## 2022-12-18 MED ORDER — SODIUM CHLORIDE 0.9 % IV SOLN
2400.0000 mg/m2 | INTRAVENOUS | Status: DC
  Administered 2022-12-18: 6100 mg via INTRAVENOUS
  Filled 2022-12-18: qty 122

## 2022-12-18 NOTE — Progress Notes (Signed)
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  HISTORY OF PRESENTING ILLNESS:  Leon Lennon. is a wonderful 32 y.o. 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 He presents today accompanied by his mother. He reports He is doing well.  He reports no color change in stool.  He notes that he is eating well but feels food just sits in stomach.  We discussed getting additional scans and labs for further evaluation and treatment which he was agreeable to. We further discussed getting MRI of brain and PET scan which he was agreeable to.  He notes that he is trying to hold it together mentally and is still trying to process his new diagnosis. We discussed the options of psychological or spiritual counseling which he has not decided on at this time. He was advised that may call back if he decides that he would like to pursue either.  We discussed getting a port a cath installed which he was agreeable to.  He recently had a biliary stent put into common bile duct.   No abdominal pain or change in bowel habits. No new or unexpected weight loss. No other new or acute focal symptoms.  We discussed his recent stomach biopsy done 07/13/2021 which sowed gastropathy and minor chronic gastritis with lymphoid aggregate. Negative for H. Pylori.  We discussed recent cytology done 07/13/2021 which showed presence of malignant cells in pancreas and poorly differentiated/high grade neuroendocrine carcinoma.  We discussed upper endoscopy done 07/13/2021 revealed mass in pancreatic head and  some enlarged lymph nodes were seen in the celiac and peripancreatic regions.  Labs done today were reviewed in detail.  INTERVAL HISTORY:  Leon Liv. Is a 32 y.o. 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 He is here for cycle 11 day 1 of his Folfiri treatment during this visit.   Patient was last seen by me on 12/04/2022 and was doing well overall.  Today, he is accompanied by her mother. He denies any new concerns since his last visit. Patient denies any infection issues, tingling/numbness in his hands/feet, skin rashes, breathing issues, abdominal pain, change in bowel habits, or new or severe side effects from [facility] treatment.  MEDICAL HISTORY:  Past Medical History:  Diagnosis Date   Asthma    Neuroendocrine cancer (HCC)     SURGICAL HISTORY: Past Surgical History:  Procedure Laterality Date   BILIARY DILATION  07/13/2021   Procedure: BILIARY DILATION;  Surgeon: Vida Rigger, MD;  Location: [facility];  Service: Gastroenterology;;   BILIARY STENT PLACEMENT N/A 07/13/2021   Procedure: BILIARY STENT PLACEMENT;  Surgeon: Vida Rigger, MD;  Location: [name];  Service: Gastroenterology;  Laterality: N/A;   BILIARY STENT PLACEMENT N/A 11/05/2021   Procedure: BILIARY STENT PLACEMENT;  Surgeon: Vida Rigger, MD;  Location: [name];  Service: Gastroenterology;  Laterality: N/A;   BIOPSY  07/13/2021   Procedure: BIOPSY;  Surgeon: Meridee Score Netty Starring., MD;  Location: [facility];  Service: Gastroenterology;;   CHOLECYSTECTOMY N/A 11/07/2021   Procedure: LAPAROSCOPIC CHOLECYSTECTOMY;  Surgeon: Berna Bue, MD;  Location: [facility];  Service: General;  Laterality: N/A;   ENDOSCOPIC  RETROGRADE CHOLANGIOPANCREATOGRAPHY (ERCP) WITH PROPOFOL N/A 07/13/2021   Procedure: ENDOSCOPIC RETROGRADE CHOLANGIOPANCREATOGRAPHY (ERCP) WITH PROPOFOL;  Surgeon: Vida Rigger, MD;  Location: [name];  Service:  Gastroenterology;  Laterality: N/A;   ERCP N/A 11/05/2021   Procedure: ENDOSCOPIC RETROGRADE CHOLANGIOPANCREATOGRAPHY (ERCP);  Surgeon: Vida Rigger, MD;  Location: [facility];  Service: Gastroenterology;  Laterality: N/A;   ESOPHAGOGASTRODUODENOSCOPY (EGD) WITH PROPOFOL N/A 07/13/2021   Procedure: ESOPHAGOGASTRODUODENOSCOPY (EGD) WITH PROPOFOL;  Surgeon: Meridee Score Netty Starring., MD;  Location: [name];  Service: Gastroenterology;  Laterality: N/A;   EUS N/A 07/13/2021   Procedure: UPPER ENDOSCOPIC ULTRASOUND (EUS) LINEAR;  Surgeon: Lemar Lofty., MD;  Location: [name];  Service: Gastroenterology;  Laterality: N/A;   FINE NEEDLE ASPIRATION  07/13/2021   Procedure: FINE NEEDLE ASPIRATION (FNA) LINEAR;  Surgeon: Lemar Lofty., MD;  Location: [facility];  Service: Gastroenterology;;   IR IMAGING GUIDED PORT INSERTION  07/31/2021   SPHINCTEROTOMY  07/13/2021   Procedure: Dennison Mascot;  Surgeon: Vida Rigger, MD;  Location: [name];  Service: Gastroenterology;;    SOCIAL HISTORY: Social History   Socioeconomic History   Marital status: Single    Spouse name: Not on file   Number of children: 0   Years of education: Not on file   Highest education level: Some college, no degree  Occupational History   Not on file  Tobacco Use   Smoking status: Some Days    Types: Cigarettes   Smokeless tobacco: Never  Vaping Use   Vaping status: Never Used  Substance and Sexual Activity   Alcohol use: Yes    Comment: social   Drug use: No   Sexual activity: Never  Other Topics Concern   Not on file  Social History Narrative   Not on file   Social Determinants of Health   Financial Resource Strain: Low Risk  (04/14/2017)   Overall Financial Resource Strain (CARDIA)    Difficulty of Paying Living Expenses: Not hard at all  Food Insecurity: No Food Insecurity (11/07/2021)   Hunger Vital Sign    Worried About Running Out of Food in the Last Year: Never true    Ran  Out of Food in the Last Year: Never true  Transportation Needs: No Transportation Needs (11/07/2021)   PRAPARE - Administrator, Civil Service (Medical): No    Lack of Transportation (Non-Medical): No  Physical Activity: Inactive (04/14/2017)   Exercise Vital Sign    Days of Exercise per Week: 0 days    Minutes of Exercise per Session: 0 min  Stress: Stress Concern Present (04/14/2017)   [name] of Occupational Health - Occupational Stress Questionnaire    Feeling of Stress : Rather much  Social Connections: Unknown (06/19/2021)   [facility]   Social Network    Social Network: Not on file  Intimate Partner Violence: Not At Risk (11/07/2021)   Humiliation, Afraid, Rape, and Kick questionnaire    Fear of Current or Ex-Partner: No    Emotionally Abused: No    Physically Abused: No    Sexually Abused: No    FAMILY HISTORY: Family History  Problem Relation Age of Onset   Drug abuse Father    ALLERGIES:  is allergic to shellfish allergy and other.  MEDICATIONS:  Current Outpatient Medications  Medication Sig Dispense Refill   acetaminophen (TYLENOL) 500 MG tablet Take 1,000 mg by mouth every 6 (six) hours as needed for mild pain.     albuterol (VENTOLIN HFA) 108 (  90 Base) MCG/ACT inhaler Inhale 1-2 puffs into the lungs every 6 (six) hours as needed for wheezing or shortness of breath. 18 g 0   dexamethasone (DECADRON) 4 MG tablet Take 2 tablets (8 mg total) by mouth daily. Start the day after chemotherapy for 2 days. Take with food. 8 tablet 5   lidocaine-prilocaine (EMLA) cream Apply to affected area once 30 g 3   loperamide (IMODIUM) 2 MG capsule Take 2 tabs by mouth with first loose stool, then 1 tab with each additional loose stool as needed. Do not exceed 8 tabs in a 24-hour period 30 capsule 0   ondansetron (ZOFRAN) 8 MG tablet Take 1 tablet (8 mg total) by mouth every 8 (eight) hours as needed for nausea, vomiting or refractory  nausea / vomiting. Start on the third day after chemotherapy. 30 tablet 1   oxyCODONE (OXY IR/ROXICODONE) 5 MG immediate release tablet Take 1 tablet (5 mg total) by mouth every 6 (six) hours as needed for severe pain or moderate pain. 20 tablet 0   polyethylene glycol (MIRALAX / GLYCOLAX) 17 g packet Take 17 g by mouth daily. 14 each 0   prochlorperazine (COMPAZINE) 10 MG tablet Take 1 tablet (10 mg total) by mouth every 6 (six) hours as needed for nausea or vomiting. 30 tablet 1   senna-docusate (SENOKOT-S) 8.6-50 MG tablet Take 2 tablets by mouth 2 (two) times daily. 30 tablet 0   No current facility-administered medications for this visit.    REVIEW OF SYSTEMS:    10 Point review of Systems was done is negative except as noted above.   PHYSICAL EXAMINATION: ECOG PERFORMANCE STATUS: 1 - Symptomatic but completely ambulatory .BP (!) 128/92   Pulse 85   Temp 97.7 F (36.5 C)   Resp 20   Wt (!) 304 lb 14.4 oz (138.3 kg)   SpO2 99%   BMI 41.35 kg/m     GENERAL:alert, in no acute distress and comfortable SKIN: no acute rashes, no significant lesions EYES: conjunctiva are pink and non-injected, sclera anicteric OROPHARYNX: MMM, no exudates, no oropharyngeal erythema or ulceration NECK: supple, no JVD LYMPH:  no palpable lymphadenopathy in the cervical, axillary or inguinal regions LUNGS: clear to auscultation b/l with normal respiratory effort HEART: regular rate & rhythm ABDOMEN:  normoactive bowel sounds , non tender, not distended. Extremity: no pedal edema PSYCH: alert & oriented x 3 with fluent speech NEURO: no focal motor/sensory deficits    LABORATORY DATA:  I have reviewed the data as listed    Latest Ref Rng & Units 12/18/2022    8:57 AM 12/04/2022    9:28 AM 11/19/2022   11:48 AM  CBC  WBC 4.0 - 10.5 K/uL 2.6  2.5  3.4   Hemoglobin 13.0 - 17.0 g/dL 16.1  09.6  04.5   Hematocrit 39.0 - 52.0 % 31.7  31.0  34.0   Platelets 150 - 400 K/uL 136  157  151        Latest Ref Rng & Units 12/18/2022    8:57 AM 12/04/2022    9:28 AM 11/19/2022   11:48 AM  CMP  Glucose 70 - 99 mg/dL 98  409  98   BUN 6 - 20 mg/dL 14  6  10    Creatinine 0.61 - 1.24 mg/dL 8.11  9.14  7.82   Sodium 135 - 145 mmol/L 141  140  140   Potassium 3.5 - 5.1 mmol/L 3.7  3.1  3.6   Chloride 98 -  111 mmol/L 108  107  108   CO2 22 - 32 mmol/L 28  25  26    Calcium 8.9 - 10.3 mg/dL 9.4  9.2  9.6   Total Protein 6.5 - 8.1 g/dL 7.1  7.0  7.5   Total Bilirubin <1.2 mg/dL 0.8  0.8  0.9   Alkaline Phos 38 - 126 U/L 89  88  98   AST 15 - 41 U/L 18  18  25    ALT 0 - 44 U/L 15  18  26     Cytology done 07/13/2021 revealed "FINAL MICROSCOPIC DIAGNOSIS:  A. PANCREAS, HEAD, FINE NEEDLE ASPIRATION:  - Malignant cells present  - Poorly differentiated/high-grade neuroendocrine carcinoma (see  comment)   B. CBD STRICTURE, BRUSHING:  - Atypical cells suspicious for tumor   C. BILIARY DILATION, BALLOON, REMOVAL:  - Atypical cells suspicious for tumor "  Surgical pathology done 07/13/2021 revealed "FINAL MICROSCOPIC DIAGNOSIS:   A. STOMACH, BIOPSY:  Reactive gastropathy and minimal chronic gastritis with lymphoid  aggregate  Negative for H. pylori, intestinal metaplasia, dysplasia and carcinoma"   Pathology 11/07/21: FINAL MICROSCOPIC DIAGNOSIS:   A. GALLBLADDER, CHOLECYSTECTOMY:  - Acute cholecystitis with necrosis and abscess.   RADIOGRAPHIC STUDIES: I have personally reviewed the radiological images as listed and agreed with the findings in the report. No results found.   ASSESSMENT & PLAN:   32 y.o. very pleasant male with  1. 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 -Cytology done 07/13/2021 shows presence of malignant cells in pancreas and poorly differentiated/high grade neuroendocrine carcinoma.  PLAN:  -Discussed lab results on 12/18/22 in detail with patient. CBC showed WBC  of 2.6K, hemoglobin of 10.6, and platelets of 136K. -blood counts showed mild neutropenia, otherwise stable -CMP stable -Patient tolerated the last cycle of his Folfiri treatment without any new or notable toxicities.  -Patient can proceed with cycle 11 day 1 of Folfiri without any dose modification.   -answered all of patient's and his mother's questions in detail -patient would prefer to delay his next Folfiri treatment by 1 week due to Thanksgiving.   FOLLOW-UP: Patient would like to delay next FOLFIRI by 1 week for thanksgiving PLz schedule next 4 treatments per integrated scheduling with adjusted dates  The total time spent in the appointment was 20 minutes* .  All of the patient's questions were answered with apparent satisfaction. The patient knows to call the clinic with any problems, questions or concerns.   Wyvonnia Lora MD MS AAHIVMS [facility] Hematology/Oncology Physician [facility]  .*Total Encounter Time as defined by the Centers for Medicare and Medicaid Services includes, in addition to the face-to-face time of a patient visit (documented in the note above) non-face-to-face time: obtaining and reviewing outside history, ordering and reviewing medications, tests or procedures, care coordination (communications with other health care professionals or caregivers) and documentation in the medical record.    I,Mitra Faeizi,acting as a Neurosurgeon for Wyvonnia Lora, MD.,have documented all relevant documentation on the behalf of Wyvonnia Lora, MD,as directed by  Wyvonnia Lora, MD while in the presence of Wyvonnia Lora, MD.  .I have reviewed the above documentation for accuracy and completeness, and I agree with the above. [name] MD

## 2022-12-18 NOTE — Progress Notes (Signed)
Patient seen by Dr. Addison Naegeli are within treatment parameters.  Labs reviewed: and are within treatment parameters. Dr Candise Che aware WBC: 2.6, PLT : 136  Per physician team, patient is ready for treatment and there are NO modifications to the treatment plan.

## 2022-12-18 NOTE — Progress Notes (Signed)
Ok to treat with ANC of 1.4 today per Dr. Candise Che

## 2022-12-18 NOTE — Progress Notes (Signed)
Per Dr Candise Che pt is ok for tx with ANC of 1.4

## 2022-12-19 ENCOUNTER — Other Ambulatory Visit: Payer: Self-pay

## 2022-12-20 ENCOUNTER — Inpatient Hospital Stay: Payer: Medicaid Other

## 2022-12-20 DIAGNOSIS — C7A1 Malignant poorly differentiated neuroendocrine tumors: Secondary | ICD-10-CM

## 2022-12-20 DIAGNOSIS — Z5111 Encounter for antineoplastic chemotherapy: Secondary | ICD-10-CM | POA: Diagnosis not present

## 2022-12-20 DIAGNOSIS — Z7189 Other specified counseling: Secondary | ICD-10-CM

## 2022-12-20 MED ORDER — HEPARIN SOD (PORK) LOCK FLUSH 100 UNIT/ML IV SOLN
500.0000 [IU] | Freq: Once | INTRAVENOUS | Status: AC | PRN
  Administered 2022-12-20: 500 [IU]

## 2022-12-20 MED ORDER — SODIUM CHLORIDE 0.9% FLUSH
10.0000 mL | INTRAVENOUS | Status: DC | PRN
Start: 2022-12-20 — End: 2022-12-20
  Administered 2022-12-20: 10 mL

## 2022-12-24 ENCOUNTER — Encounter: Payer: Self-pay | Admitting: Hematology

## 2022-12-25 ENCOUNTER — Other Ambulatory Visit: Payer: Self-pay

## 2023-01-01 ENCOUNTER — Encounter: Payer: Self-pay | Admitting: Hematology

## 2023-01-08 ENCOUNTER — Inpatient Hospital Stay: Payer: Medicaid Other | Attending: Hematology

## 2023-01-08 ENCOUNTER — Inpatient Hospital Stay: Payer: Medicaid Other

## 2023-01-08 ENCOUNTER — Inpatient Hospital Stay (HOSPITAL_BASED_OUTPATIENT_CLINIC_OR_DEPARTMENT_OTHER): Payer: Medicaid Other | Admitting: Hematology

## 2023-01-08 VITALS — BP 146/96 | HR 94 | Temp 97.9°F | Wt 311.4 lb

## 2023-01-08 VITALS — Resp 16

## 2023-01-08 DIAGNOSIS — Z95828 Presence of other vascular implants and grafts: Secondary | ICD-10-CM

## 2023-01-08 DIAGNOSIS — C7A1 Malignant poorly differentiated neuroendocrine tumors: Secondary | ICD-10-CM | POA: Insufficient documentation

## 2023-01-08 DIAGNOSIS — Z7189 Other specified counseling: Secondary | ICD-10-CM

## 2023-01-08 DIAGNOSIS — F17210 Nicotine dependence, cigarettes, uncomplicated: Secondary | ICD-10-CM | POA: Insufficient documentation

## 2023-01-08 DIAGNOSIS — Z5111 Encounter for antineoplastic chemotherapy: Secondary | ICD-10-CM

## 2023-01-08 LAB — CMP (CANCER CENTER ONLY)
ALT: 61 U/L — ABNORMAL HIGH (ref 0–44)
AST: 116 U/L — ABNORMAL HIGH (ref 15–41)
Albumin: 4.1 g/dL (ref 3.5–5.0)
Alkaline Phosphatase: 129 U/L — ABNORMAL HIGH (ref 38–126)
Anion gap: 5 (ref 5–15)
BUN: 11 mg/dL (ref 6–20)
CO2: 29 mmol/L (ref 22–32)
Calcium: 9.4 mg/dL (ref 8.9–10.3)
Chloride: 107 mmol/L (ref 98–111)
Creatinine: 0.79 mg/dL (ref 0.61–1.24)
GFR, Estimated: >60 mL/min (ref >60)
Glucose, Bld: 106 mg/dL — ABNORMAL HIGH (ref 70–99)
Potassium: 3.4 mmol/L — ABNORMAL LOW (ref 3.5–5.1)
Sodium: 141 mmol/L (ref 135–145)
Total Bilirubin: 0.9 mg/dL (ref <1.2)
Total Protein: 7 g/dL (ref 6.5–8.1)

## 2023-01-08 LAB — CBC WITH DIFFERENTIAL (CANCER CENTER ONLY)
Abs Immature Granulocytes: 0.01 10*3/uL (ref 0.00–0.07)
Basophils Absolute: 0 10*3/uL (ref 0.0–0.1)
Basophils Relative: 1 %
Eosinophils Absolute: 0.6 10*3/uL — ABNORMAL HIGH (ref 0.0–0.5)
Eosinophils Relative: 17 %
HCT: 33.7 % — ABNORMAL LOW (ref 39.0–52.0)
Hemoglobin: 11.2 g/dL — ABNORMAL LOW (ref 13.0–17.0)
Immature Granulocytes: 0 %
Lymphocytes Relative: 17 %
Lymphs Abs: 0.7 10*3/uL (ref 0.7–4.0)
MCH: 29 pg (ref 26.0–34.0)
MCHC: 33.2 g/dL (ref 30.0–36.0)
MCV: 87.3 fL (ref 80.0–100.0)
Monocytes Absolute: 0.6 10*3/uL (ref 0.1–1.0)
Monocytes Relative: 15 %
Neutro Abs: 1.9 10*3/uL (ref 1.7–7.7)
Neutrophils Relative %: 50 %
Platelet Count: 168 10*3/uL (ref 150–400)
RBC: 3.86 MIL/uL — ABNORMAL LOW (ref 4.22–5.81)
RDW: 16.5 % — ABNORMAL HIGH (ref 11.5–15.5)
WBC Count: 3.8 10*3/uL — ABNORMAL LOW (ref 4.0–10.5)
nRBC: 0 % (ref 0.0–0.2)

## 2023-01-08 MED ORDER — PALONOSETRON HCL INJECTION 0.25 MG/5ML
0.2500 mg | Freq: Once | INTRAVENOUS | Status: AC
  Administered 2023-01-08: 0.25 mg via INTRAVENOUS
  Filled 2023-01-08: qty 5

## 2023-01-08 MED ORDER — ATROPINE SULFATE 1 MG/ML IV SOLN
0.5000 mg | Freq: Once | INTRAVENOUS | Status: AC | PRN
  Administered 2023-01-08: 0.5 mg via INTRAVENOUS
  Filled 2023-01-08: qty 1

## 2023-01-08 MED ORDER — SODIUM CHLORIDE 0.9% FLUSH
10.0000 mL | Freq: Once | INTRAVENOUS | Status: AC
  Administered 2023-01-08: 10 mL

## 2023-01-08 MED ORDER — SODIUM CHLORIDE 0.9 % IV SOLN: Freq: Once | INTRAVENOUS | Status: AC

## 2023-01-08 MED ORDER — SODIUM CHLORIDE 0.9 % IV SOLN
2400.0000 mg/m2 | INTRAVENOUS | Status: DC
  Administered 2023-01-08: 6100 mg via INTRAVENOUS
  Filled 2023-01-08: qty 122

## 2023-01-08 MED ORDER — SODIUM CHLORIDE 0.9 % IV SOLN
400.0000 mg/m2 | Freq: Once | INTRAVENOUS | Status: AC
  Administered 2023-01-08: 1020 mg via INTRAVENOUS
  Filled 2023-01-08: qty 50

## 2023-01-08 MED ORDER — DEXAMETHASONE SODIUM PHOSPHATE 10 MG/ML IJ SOLN
10.0000 mg | Freq: Once | INTRAMUSCULAR | Status: AC
  Administered 2023-01-08: 10 mg via INTRAVENOUS
  Filled 2023-01-08: qty 1

## 2023-01-08 MED ORDER — SODIUM CHLORIDE 0.9 % IV SOLN
180.0000 mg/m2 | Freq: Once | INTRAVENOUS | Status: AC
  Administered 2023-01-08: 460 mg via INTRAVENOUS
  Filled 2023-01-08: qty 15

## 2023-01-08 MED ORDER — FLUOROURACIL CHEMO INJECTION 2.5 GM/50ML
400.0000 mg/m2 | Freq: Once | INTRAVENOUS | Status: AC
  Administered 2023-01-08: 1000 mg via INTRAVENOUS
  Filled 2023-01-08: qty 20

## 2023-01-08 NOTE — Patient Instructions (Signed)
[facility] - A DEPT OF [facility]  Discharge Instructions: Thank you for choosing [facility] to provide your oncology and hematology care.   If you have a lab appointment with the Cancer Center, please go directly to the Cancer Center and check in at the registration area.   Wear comfortable clothing and clothing appropriate for easy access to any Portacath or PICC line.   We strive to give you quality time with your provider. You may need to reschedule your appointment if you arrive late (15 or more minutes).  Arriving late affects you and other patients whose appointments are after yours.  Also, if you miss three or more appointments without notifying the office, you may be dismissed from the clinic at the provider's discretion.      For prescription refill requests, have your pharmacy contact our office and allow 72 hours for refills to be completed.    Today you received the following chemotherapy and/or immunotherapy agents irinotecan, leucovorin, fluorourcil      To help prevent nausea and vomiting after your treatment, we encourage you to take your nausea medication as directed.  BELOW ARE SYMPTOMS THAT SHOULD BE REPORTED IMMEDIATELY: *FEVER GREATER THAN 100.4 F (38 C) OR HIGHER *CHILLS OR SWEATING *NAUSEA AND VOMITING THAT IS NOT CONTROLLED WITH YOUR NAUSEA MEDICATION *UNUSUAL SHORTNESS OF BREATH *UNUSUAL BRUISING OR BLEEDING *URINARY PROBLEMS (pain or burning when urinating, or frequent urination) *BOWEL PROBLEMS (unusual diarrhea, constipation, pain near the anus) TENDERNESS IN MOUTH AND THROAT WITH OR WITHOUT PRESENCE OF ULCERS (sore throat, sores in mouth, or a toothache) UNUSUAL RASH, SWELLING OR PAIN  UNUSUAL VAGINAL DISCHARGE OR ITCHING   Items with * indicate a potential emergency and should be followed up as soon as possible or go to the Emergency Department if any problems should occur.  Please show the CHEMOTHERAPY  ALERT CARD or IMMUNOTHERAPY ALERT CARD at check-in to the Emergency Department and triage nurse.  Should you have questions after your visit or need to cancel or reschedule your appointment, please contact [facility] - A DEPT OF [facility]  Dept: [phone]  and follow the prompts.  Office hours are 8:00 a.m. to 4:30 p.m. Monday - Friday. Please note that voicemails left after 4:00 p.m. may not be returned until the following business day.  We are closed weekends and major holidays. You have access to a nurse at all times for urgent questions. Please call the main number to the clinic Dept: [phone] and follow the prompts.   For any non-urgent questions, you may also contact your provider using MyChart. We now offer e-Visits for anyone 3 and older to request care online for non-urgent symptoms. For details visit [email or web address].   Also download the MyChart app! Go to the app store, search "MyChart", open the app, select [place], and log in with your MyChart username and password.

## 2023-01-08 NOTE — Progress Notes (Signed)
Patient seen by Dr. Addison Naegeli are within treatment parameters. BP 146/96 Dr Candise Che aware  Labs reviewed: and are not all within treatment parameters.   Dr Candise Che aware AST: 116, ALT:61, Al Phos: 129, K 3.4,   Per physician team, patient is ready for treatment and there are NO modifications to the treatment plan.

## 2023-01-10 ENCOUNTER — Inpatient Hospital Stay: Payer: Medicaid Other

## 2023-01-10 DIAGNOSIS — Z95828 Presence of other vascular implants and grafts: Secondary | ICD-10-CM

## 2023-01-10 DIAGNOSIS — Z5111 Encounter for antineoplastic chemotherapy: Secondary | ICD-10-CM | POA: Diagnosis not present

## 2023-01-10 MED ORDER — SODIUM CHLORIDE 0.9% FLUSH
10.0000 mL | Freq: Once | INTRAVENOUS | Status: AC
  Administered 2023-01-10: 10 mL

## 2023-01-10 MED ORDER — HEPARIN SOD (PORK) LOCK FLUSH 100 UNIT/ML IV SOLN
500.0000 [IU] | Freq: Once | INTRAVENOUS | Status: AC
  Administered 2023-01-10: 500 [IU]

## 2023-01-14 ENCOUNTER — Encounter: Payer: Self-pay | Admitting: Hematology

## 2023-01-14 NOTE — Progress Notes (Signed)
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  HISTORY OF PRESENTING ILLNESS:   Leon Bettin. is a wonderful 32 y.o. 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 He presents today accompanied by his mother. He reports He is doing well.  He reports no color change in stool.  He notes that he is eating well but feels food just sits in stomach.  We discussed getting additional scans and labs for further evaluation and treatment which he was agreeable to. We further discussed getting MRI of brain and PET scan which he was agreeable to.  He notes that he is trying to hold it together mentally and is still trying to process his new diagnosis. We discussed the options of psychological or spiritual counseling which he has not decided on at this time. He was advised that may call back if he decides that he would like to pursue either.  We discussed getting a port a cath installed which he was agreeable to.  He recently had a biliary stent put into common bile duct.   No abdominal pain or change in bowel habits. No new or unexpected weight loss. No other new or acute focal symptoms.  We discussed his recent stomach biopsy done 07/13/2021 which sowed gastropathy and minor chronic gastritis with lymphoid aggregate. Negative for H. Pylori.  We discussed recent cytology done 07/13/2021 which showed presence of malignant cells in pancreas and poorly differentiated/high grade neuroendocrine carcinoma.  We discussed upper endoscopy done 07/13/2021 revealed mass in pancreatic head  and some enlarged lymph nodes were seen in the celiac and peripancreatic regions.  Labs done today were reviewed in detail.  INTERVAL HISTORY:  Leon Fischer. Is a 32 y.o. 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 He is here fornext cycle of his Folfiri treatment during this visit.   Marland KitchenDiscussed the use of AI scribe software for clinical note transcription with the patient, who gave verbal consent to proceed.  The patient, currently undergoing palliative FOLFIRI chemotherapy for a high-grade metastatic neuroendocrine tumor, presented for a follow-up consultation post-treatment. He reported no new concerns or side effects since the last chemotherapy session. Specifically, he denied experiencing nausea, abdominal pain, changes in breathing, chest pain, leg swelling, or any new tingling or numbness in the hands or feet. The patient's appetite remained good, and his weight stable. He even reported engaging in a few workouts.  Laboratory results showed steady blood counts with a slight improvement in hemoglobin levels and white blood cell count. However, there was a slight decrease in potassium levels and a minor increase in liver enzymes, which raised concerns about potential changes in the liver possibly due to chemotherapy or cancer progression. The patient denied experiencing any diarrhea, which could have contributed to the low potassium levels.  The patient expressed a desire to return to work part-time during the weeks he was not undergoing chemotherapy, seeking a sense of normalcy and routine. 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 However, he was open to modulating his work schedule based on how he was feeling on a  given day. The patient also expressed concerns about financial constraints, indicating that his current disability benefits were barely enough  to cover bills and left no room for social activities.  The patient was open to the idea of seeking an additional opinion from [facility] regarding his treatment options, especially in light of potential disease progression. He was also open to the idea of exploring options for accessing his retirement money to alleviate financial stress.       MEDICAL HISTORY:  Past Medical History:  Diagnosis Date   Asthma    Neuroendocrine cancer (HCC)     SURGICAL HISTORY: Past Surgical History:  Procedure Laterality Date   BILIARY DILATION  07/13/2021   Procedure: BILIARY DILATION;  Surgeon: Vida Rigger, MD;  Location: [facility];  Service: Gastroenterology;;   BILIARY STENT PLACEMENT N/A 07/13/2021   Procedure: BILIARY STENT PLACEMENT;  Surgeon: Vida Rigger, MD;  Location: [name];  Service: Gastroenterology;  Laterality: N/A;   BILIARY STENT PLACEMENT N/A 11/05/2021   Procedure: BILIARY STENT PLACEMENT;  Surgeon: Vida Rigger, MD;  Location: [name];  Service: Gastroenterology;  Laterality: N/A;   BIOPSY  07/13/2021   Procedure: BIOPSY;  Surgeon: Meridee Score Netty Starring., MD;  Location: [facility];  Service: Gastroenterology;;   CHOLECYSTECTOMY N/A 11/07/2021   Procedure: LAPAROSCOPIC CHOLECYSTECTOMY;  Surgeon: Berna Bue, MD;  Location: [facility];  Service: General;  Laterality: N/A;   ENDOSCOPIC RETROGRADE CHOLANGIOPANCREATOGRAPHY (ERCP) WITH PROPOFOL N/A 07/13/2021   Procedure: ENDOSCOPIC RETROGRADE CHOLANGIOPANCREATOGRAPHY (ERCP) WITH PROPOFOL;  Surgeon: Vida Rigger, MD;  Location: [name];  Service: Gastroenterology;  Laterality: N/A;   ERCP N/A 11/05/2021   Procedure: ENDOSCOPIC RETROGRADE CHOLANGIOPANCREATOGRAPHY (ERCP);  Surgeon: Vida Rigger, MD;  Location: [facility];  Service: Gastroenterology;  Laterality: N/A;   ESOPHAGOGASTRODUODENOSCOPY (EGD) WITH PROPOFOL N/A 07/13/2021   Procedure: ESOPHAGOGASTRODUODENOSCOPY (EGD) WITH PROPOFOL;  Surgeon: Meridee Score Netty Starring., MD;   Location: [name];  Service: Gastroenterology;  Laterality: N/A;   EUS N/A 07/13/2021   Procedure: UPPER ENDOSCOPIC ULTRASOUND (EUS) LINEAR;  Surgeon: Lemar Lofty., MD;  Location: [name];  Service: Gastroenterology;  Laterality: N/A;   FINE NEEDLE ASPIRATION  07/13/2021   Procedure: FINE NEEDLE ASPIRATION (FNA) LINEAR;  Surgeon: Lemar Lofty., MD;  Location: [facility];  Service: Gastroenterology;;   IR IMAGING GUIDED PORT INSERTION  07/31/2021   SPHINCTEROTOMY  07/13/2021   Procedure: Dennison Mascot;  Surgeon: Vida Rigger, MD;  Location: [name];  Service: Gastroenterology;;    SOCIAL HISTORY: Social History   Socioeconomic History   Marital status: Single    Spouse name: Not on file   Number of children: 0   Years of education: Not on file   Highest education level: Some college, no degree  Occupational History   Not on file  Tobacco Use   Smoking status: Some Days    Types: Cigarettes   Smokeless tobacco: Never  Vaping Use   Vaping status: Never Used  Substance and Sexual Activity   Alcohol use: Yes    Comment: social   Drug use: No   Sexual activity: Never  Other Topics Concern   Not on file  Social History Narrative   Not on file   Social Determinants of Health   Financial Resource Strain: Low Risk  (04/14/2017)   Overall Financial Resource Strain (CARDIA)    Difficulty of Paying Living Expenses: Not hard at all  Food Insecurity: No Food Insecurity (11/07/2021)   Hunger Vital Sign    Worried About Running Out of Food  in the Last Year: Never true    Ran Out of Food in the Last Year: Never true  Transportation Needs: No Transportation Needs (11/07/2021)   PRAPARE - Administrator, Civil Service (Medical): No    Lack of Transportation (Non-Medical): No  Physical Activity: Inactive (04/14/2017)   Exercise Vital Sign    Days of Exercise per Week: 0 days    Minutes of Exercise per Session: 0 min  Stress: Stress Concern  Present (04/14/2017)   [name] of Occupational Health - Occupational Stress Questionnaire    Feeling of Stress : Rather much  Social Connections: Unknown (06/19/2021)   Received from [facility]   Social Network    Social Network: Not on file  Intimate Partner Violence: Not At Risk (11/07/2021)   Humiliation, Afraid, Rape, and Kick questionnaire    Fear of Current or Ex-Partner: No    Emotionally Abused: No    Physically Abused: No    Sexually Abused: No    FAMILY HISTORY: Family History  Problem Relation Age of Onset   Drug abuse Father    ALLERGIES:  is allergic to shellfish allergy and other.  MEDICATIONS:  Current Outpatient Medications  Medication Sig Dispense Refill   acetaminophen (TYLENOL) 500 MG tablet Take 1,000 mg by mouth every 6 (six) hours as needed for mild pain.     albuterol (VENTOLIN HFA) 108 (90 Base) MCG/ACT inhaler Inhale 1-2 puffs into the lungs every 6 (six) hours as needed for wheezing or shortness of breath. 18 g 0   dexamethasone (DECADRON) 4 MG tablet Take 2 tablets (8 mg total) by mouth daily. Start the day after chemotherapy for 2 days. Take with food. 8 tablet 5   lidocaine-prilocaine (EMLA) cream Apply to affected area once 30 g 3   loperamide (IMODIUM) 2 MG capsule Take 2 tabs by mouth with first loose stool, then 1 tab with each additional loose stool as needed. Do not exceed 8 tabs in a 24-hour period 30 capsule 0   ondansetron (ZOFRAN) 8 MG tablet Take 1 tablet (8 mg total) by mouth every 8 (eight) hours as needed for nausea, vomiting or refractory nausea / vomiting. Start on the third day after chemotherapy. 30 tablet 1   oxyCODONE (OXY IR/ROXICODONE) 5 MG immediate release tablet Take 1 tablet (5 mg total) by mouth every 6 (six) hours as needed for severe pain or moderate pain. 20 tablet 0   polyethylene glycol (MIRALAX / GLYCOLAX) 17 g packet Take 17 g by mouth daily. 14 each 0   prochlorperazine (COMPAZINE) 10 MG tablet  Take 1 tablet (10 mg total) by mouth every 6 (six) hours as needed for nausea or vomiting. 30 tablet 1   senna-docusate (SENOKOT-S) 8.6-50 MG tablet Take 2 tablets by mouth 2 (two) times daily. 30 tablet 0   No current facility-administered medications for this visit.    REVIEW OF SYSTEMS:    10 Point review of Systems was done is negative except as noted above.   PHYSICAL EXAMINATION: ECOG PERFORMANCE STATUS: 1 - Symptomatic but completely ambulatory .BP (!) 146/96 (BP Location: Left Arm)   Pulse 94   Temp 97.9 F (36.6 C) (Temporal)   Wt (!) 311 lb 6.4 oz (141.3 kg)   SpO2 100%   BMI 42.23 kg/m  . GENERAL:alert, in no acute distress and comfortable NECK: supple, no JVD LYMPH:  no palpable lymphadenopathy in the cervical, axillary or inguinal regions LUNGS: clear to auscultation b/l with  normal respiratory effort HEART: regular rate & rhythm ABDOMEN:  normoactive bowel sounds , non tender, not distended. Extremity: no pedal edema PSYCH: alert & oriented x 3 with fluent speech NEURO: no focal motor/sensory deficits    LABORATORY DATA:  I have reviewed the data as listed    Latest Ref Rng & Units 01/08/2023   10:41 AM 12/18/2022    8:57 AM 12/04/2022    9:28 AM  CBC  WBC 4.0 - 10.5 K/uL 3.8  2.6  2.5   Hemoglobin 13.0 - 17.0 g/dL 16.1  09.6  04.5   Hematocrit 39.0 - 52.0 % 33.7  31.7  31.0   Platelets 150 - 400 K/uL 168  136  157       Latest Ref Rng & Units 01/08/2023   10:41 AM 12/18/2022    8:57 AM 12/04/2022    9:28 AM  CMP  Glucose 70 - 99 mg/dL 409  98  811   BUN 6 - 20 mg/dL 11  14  6    Creatinine 0.61 - 1.24 mg/dL 9.14  7.82  9.56   Sodium 135 - 145 mmol/L 141  141  140   Potassium 3.5 - 5.1 mmol/L 3.4  3.7  3.1   Chloride 98 - 111 mmol/L 107  108  107   CO2 22 - 32 mmol/L 29  28  25    Calcium 8.9 - 10.3 mg/dL 9.4  9.4  9.2   Total Protein 6.5 - 8.1 g/dL 7.0  7.1  7.0   Total Bilirubin <1.2 mg/dL 0.9  0.8  0.8   Alkaline Phos 38 - 126 U/L 129  89  88    AST 15 - 41 U/L 116  18  18   ALT 0 - 44 U/L 61  15  18    Cytology done 07/13/2021 revealed "FINAL MICROSCOPIC DIAGNOSIS:  A. PANCREAS, HEAD, FINE NEEDLE ASPIRATION:  - Malignant cells present  - Poorly differentiated/high-grade neuroendocrine carcinoma (see  comment)   B. CBD STRICTURE, BRUSHING:  - Atypical cells suspicious for tumor   C. BILIARY DILATION, BALLOON, REMOVAL:  - Atypical cells suspicious for tumor "  Surgical pathology done 07/13/2021 revealed "FINAL MICROSCOPIC DIAGNOSIS:   A. STOMACH, BIOPSY:  Reactive gastropathy and minimal chronic gastritis with lymphoid  aggregate  Negative for H. pylori, intestinal metaplasia, dysplasia and carcinoma"   Pathology 11/07/21: FINAL MICROSCOPIC DIAGNOSIS:   A. GALLBLADDER, CHOLECYSTECTOMY:  - Acute cholecystitis with necrosis and abscess.   RADIOGRAPHIC STUDIES: I have personally reviewed the radiological images as listed and agreed with the findings in the report. No results found.   ASSESSMENT & PLAN:   32 y.o. very pleasant male with  1. 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 -Cytology done 07/13/2021 shows presence of malignant cells in pancreas and poorly differentiated/high grade neuroendocrine carcinoma.  PLAN:    Metastatic Neuroendocrine Tumor Currently on Folfiri chemotherapy every two weeks. No new symptoms or side effects reported. Labs show stable blood counts with slight improvement in hemoglobin and white counts. Noted slightly low potassium levels and elevated liver enzymes. -Continue Folfiri chemotherapy. -Increase dietary potassium intake. -Order CT scan of chest, abdomen, and pelvis before next treatment due to elevated liver enzymes. -Consider referral to [facility] for additional opinion if progression is noted on next scan.  Employment during Chemotherapy Patient expressed desire to return to part-time  work during weeks without chemotherapy for a sense of normalcy.  Discussed potential risks and benefits, and the need for flexibility based on patient's health status and treatment schedule. -Provide letter to employer stating patient has been under active treatment and may return to work part-time on weeks without chemotherapy, with the understanding that this may change based on patient's health status and treatment response. -Provide letter to employer stating patient has been under active treatment from the onset of chemotherapy.      FOLLOW-UP: FOLFIRI as per integrated scheduling CT CAP in 5-7 days MD visit with next treatment  .The total time spent in the appointment was 30 minutes* .  All of the patient's questions were answered with apparent satisfaction. The patient knows to call the clinic with any problems, questions or concerns.   Wyvonnia Lora MD MS AAHIVMS [facility] Hematology/Oncology Physician [facility]  .*Total Encounter Time as defined by the Centers for Medicare and Medicaid Services includes, in addition to the face-to-face time of a patient visit (documented in the note above) non-face-to-face time: obtaining and reviewing outside history, ordering and reviewing medications, tests or procedures, care coordination (communications with other health care professionals or caregivers) and documentation in the medical record.

## 2023-01-17 ENCOUNTER — Ambulatory Visit (HOSPITAL_COMMUNITY)
Admission: RE | Admit: 2023-01-17 | Discharge: 2023-01-17 | Disposition: A | Discharge status: Home / Self Care | Payer: Medicaid Other | Source: Ambulatory Visit | Attending: Hematology

## 2023-01-17 DIAGNOSIS — C7A1 Malignant poorly differentiated neuroendocrine tumors: Secondary | ICD-10-CM | POA: Insufficient documentation

## 2023-01-17 MED ORDER — IOHEXOL 300 MG/ML  SOLN
100.0000 mL | Freq: Once | INTRAMUSCULAR | Status: AC | PRN
  Administered 2023-01-17: 100 mL via INTRAVENOUS

## 2023-01-18 ENCOUNTER — Other Ambulatory Visit: Payer: Self-pay

## 2023-01-20 ENCOUNTER — Encounter (HOSPITAL_COMMUNITY): Payer: Self-pay

## 2023-01-20 ENCOUNTER — Emergency Department (HOSPITAL_COMMUNITY): Payer: Medicaid Other

## 2023-01-20 ENCOUNTER — Inpatient Hospital Stay (HOSPITAL_COMMUNITY)
Admission: EM | Admit: 2023-01-20 | Discharge: 2023-01-23 | DRG: 193 | Disposition: A | Discharge status: Home / Self Care | Payer: Medicaid Other | Attending: Internal Medicine | Admitting: Internal Medicine

## 2023-01-20 ENCOUNTER — Other Ambulatory Visit: Payer: Self-pay

## 2023-01-20 DIAGNOSIS — D849 Immunodeficiency, unspecified: Secondary | ICD-10-CM | POA: Diagnosis present

## 2023-01-20 DIAGNOSIS — D709 Neutropenia, unspecified: Secondary | ICD-10-CM | POA: Diagnosis present

## 2023-01-20 DIAGNOSIS — Z9049 Acquired absence of other specified parts of digestive tract: Secondary | ICD-10-CM

## 2023-01-20 DIAGNOSIS — D696 Thrombocytopenia, unspecified: Secondary | ICD-10-CM | POA: Diagnosis present

## 2023-01-20 DIAGNOSIS — Z91013 Allergy to seafood: Secondary | ICD-10-CM | POA: Diagnosis not present

## 2023-01-20 DIAGNOSIS — E876 Hypokalemia: Secondary | ICD-10-CM | POA: Diagnosis present

## 2023-01-20 DIAGNOSIS — Z1152 Encounter for screening for COVID-19: Secondary | ICD-10-CM

## 2023-01-20 DIAGNOSIS — Z6841 Body Mass Index (BMI) 40.0 and over, adult: Secondary | ICD-10-CM

## 2023-01-20 DIAGNOSIS — F17210 Nicotine dependence, cigarettes, uncomplicated: Secondary | ICD-10-CM | POA: Diagnosis present

## 2023-01-20 DIAGNOSIS — Z23 Encounter for immunization: Secondary | ICD-10-CM

## 2023-01-20 DIAGNOSIS — C7A1 Malignant poorly differentiated neuroendocrine tumors: Secondary | ICD-10-CM | POA: Diagnosis present

## 2023-01-20 DIAGNOSIS — J189 Pneumonia, unspecified organism: Secondary | ICD-10-CM | POA: Diagnosis present

## 2023-01-20 DIAGNOSIS — Z813 Family history of other psychoactive substance abuse and dependence: Secondary | ICD-10-CM

## 2023-01-20 DIAGNOSIS — E66813 Obesity, class 3: Secondary | ICD-10-CM | POA: Diagnosis present

## 2023-01-20 DIAGNOSIS — D61810 Antineoplastic chemotherapy induced pancytopenia: Secondary | ICD-10-CM | POA: Diagnosis not present

## 2023-01-20 DIAGNOSIS — T451X5A Adverse effect of antineoplastic and immunosuppressive drugs, initial encounter: Secondary | ICD-10-CM | POA: Diagnosis not present

## 2023-01-20 DIAGNOSIS — J159 Unspecified bacterial pneumonia: Secondary | ICD-10-CM | POA: Diagnosis present

## 2023-01-20 DIAGNOSIS — J45901 Unspecified asthma with (acute) exacerbation: Secondary | ICD-10-CM | POA: Diagnosis present

## 2023-01-20 DIAGNOSIS — J188 Other pneumonia, unspecified organism: Secondary | ICD-10-CM | POA: Diagnosis present

## 2023-01-20 DIAGNOSIS — C787 Secondary malignant neoplasm of liver and intrahepatic bile duct: Secondary | ICD-10-CM | POA: Diagnosis present

## 2023-01-20 DIAGNOSIS — D6481 Anemia due to antineoplastic chemotherapy: Secondary | ICD-10-CM | POA: Diagnosis present

## 2023-01-20 DIAGNOSIS — J45909 Unspecified asthma, uncomplicated: Secondary | ICD-10-CM | POA: Diagnosis present

## 2023-01-20 LAB — CBC WITH DIFFERENTIAL/PLATELET
Abs Immature Granulocytes: 0.02 10*3/uL (ref 0.00–0.07)
Basophils Absolute: 0 10*3/uL (ref 0.0–0.1)
Basophils Relative: 0 %
Eosinophils Absolute: 0.4 10*3/uL (ref 0.0–0.5)
Eosinophils Relative: 6 %
HCT: 33.5 % — ABNORMAL LOW (ref 39.0–52.0)
Hemoglobin: 11.4 g/dL — ABNORMAL LOW (ref 13.0–17.0)
Immature Granulocytes: 0 %
Lymphocytes Relative: 7 %
Lymphs Abs: 0.5 10*3/uL — ABNORMAL LOW (ref 0.7–4.0)
MCH: 29.4 pg (ref 26.0–34.0)
MCHC: 34 g/dL (ref 30.0–36.0)
MCV: 86.3 fL (ref 80.0–100.0)
Monocytes Absolute: 0.4 10*3/uL (ref 0.1–1.0)
Monocytes Relative: 6 %
Neutro Abs: 5.6 10*3/uL (ref 1.7–7.7)
Neutrophils Relative %: 81 %
Platelets: 153 10*3/uL (ref 150–400)
RBC: 3.88 MIL/uL — ABNORMAL LOW (ref 4.22–5.81)
RDW: 16.8 % — ABNORMAL HIGH (ref 11.5–15.5)
WBC: 6.9 10*3/uL (ref 4.0–10.5)
nRBC: 0 % (ref 0.0–0.2)

## 2023-01-20 LAB — COMPREHENSIVE METABOLIC PANEL
ALT: 20 U/L (ref 0–44)
AST: 24 U/L (ref 15–41)
Albumin: 4.3 g/dL (ref 3.5–5.0)
Alkaline Phosphatase: 92 U/L (ref 38–126)
Anion gap: 9 (ref 5–15)
BUN: 10 mg/dL (ref 6–20)
CO2: 22 mmol/L (ref 22–32)
Calcium: 9.1 mg/dL (ref 8.9–10.3)
Chloride: 105 mmol/L (ref 98–111)
Creatinine, Ser: 0.81 mg/dL (ref 0.61–1.24)
GFR, Estimated: >60 mL/min (ref >60)
Glucose, Bld: 111 mg/dL — ABNORMAL HIGH (ref 70–99)
Potassium: 3.4 mmol/L — ABNORMAL LOW (ref 3.5–5.1)
Sodium: 136 mmol/L (ref 135–145)
Total Bilirubin: 1.6 mg/dL — ABNORMAL HIGH (ref <1.2)
Total Protein: 7.9 g/dL (ref 6.5–8.1)

## 2023-01-20 LAB — RESP PANEL BY RT-PCR (RSV, FLU A&B, COVID)  RVPGX2
Influenza A by PCR: NEGATIVE
Influenza B by PCR: NEGATIVE
Resp Syncytial Virus by PCR: NEGATIVE
SARS Coronavirus 2 by RT PCR: NEGATIVE

## 2023-01-20 MED ORDER — SODIUM CHLORIDE 0.9 % IV SOLN
500.0000 mg | INTRAVENOUS | Status: DC
  Administered 2023-01-21: 500 mg via INTRAVENOUS
  Filled 2023-01-20 (×2): qty 5

## 2023-01-20 MED ORDER — SODIUM CHLORIDE 0.9 % IV SOLN
2.0000 g | INTRAVENOUS | Status: DC
  Administered 2023-01-21 – 2023-01-22 (×2): 2 g via INTRAVENOUS
  Filled 2023-01-20 (×2): qty 20

## 2023-01-20 MED ORDER — ENOXAPARIN SODIUM 40 MG/0.4ML IJ SOSY: 40.0000 mg | PREFILLED_SYRINGE | INTRAMUSCULAR | Status: DC

## 2023-01-20 MED ORDER — SODIUM CHLORIDE 0.9 % IV SOLN
1.0000 g | Freq: Once | INTRAVENOUS | Status: AC
  Administered 2023-01-20: 1 g via INTRAVENOUS
  Filled 2023-01-20: qty 10

## 2023-01-20 MED ORDER — PROCHLORPERAZINE EDISYLATE 10 MG/2ML IJ SOLN: 10.0000 mg | Freq: Four times a day (QID) | INTRAMUSCULAR | Status: DC | PRN

## 2023-01-20 MED ORDER — ALBUTEROL (5 MG/ML) CONTINUOUS INHALATION SOLN: 10.0000 mg/h | INHALATION_SOLUTION | Freq: Once | RESPIRATORY_TRACT | Status: DC

## 2023-01-20 MED ORDER — ENOXAPARIN SODIUM 80 MG/0.8ML IJ SOSY
70.0000 mg | PREFILLED_SYRINGE | INTRAMUSCULAR | Status: DC
  Administered 2023-01-20 – 2023-01-22 (×3): 70 mg via SUBCUTANEOUS
  Filled 2023-01-20: qty 0.7
  Filled 2023-01-20: qty 0.8
  Filled 2023-01-20: qty 0.7
  Filled 2023-01-20 (×2): qty 0.8

## 2023-01-20 MED ORDER — ACETAMINOPHEN 650 MG RE SUPP: 650.0000 mg | Freq: Four times a day (QID) | RECTAL | Status: DC | PRN

## 2023-01-20 MED ORDER — ACETAMINOPHEN 325 MG PO TABS: 650.0000 mg | ORAL_TABLET | Freq: Four times a day (QID) | ORAL | Status: DC | PRN

## 2023-01-20 MED ORDER — IOHEXOL 350 MG/ML SOLN
75.0000 mL | Freq: Once | INTRAVENOUS | Status: AC | PRN
  Administered 2023-01-20: 90 mL via INTRAVENOUS

## 2023-01-20 MED ORDER — PREDNISONE 20 MG PO TABS
60.0000 mg | ORAL_TABLET | Freq: Once | ORAL | Status: AC
  Administered 2023-01-20: 60 mg via ORAL
  Filled 2023-01-20: qty 3

## 2023-01-20 MED ORDER — IPRATROPIUM-ALBUTEROL 0.5-2.5 (3) MG/3ML IN SOLN
3.0000 mL | Freq: Once | RESPIRATORY_TRACT | Status: AC
  Administered 2023-01-20: 3 mL via RESPIRATORY_TRACT
  Filled 2023-01-20: qty 3

## 2023-01-20 MED ORDER — ALBUTEROL SULFATE (2.5 MG/3ML) 0.083% IN NEBU
2.5000 mg | INHALATION_SOLUTION | Freq: Once | RESPIRATORY_TRACT | Status: AC
  Administered 2023-01-20: 2.5 mg via RESPIRATORY_TRACT
  Filled 2023-01-20: qty 3

## 2023-01-20 MED ORDER — ALBUTEROL SULFATE (2.5 MG/3ML) 0.083% IN NEBU: 2.5000 mg | INHALATION_SOLUTION | Freq: Once | RESPIRATORY_TRACT | Status: DC

## 2023-01-20 MED ORDER — MAGNESIUM SULFATE 2 GM/50ML IV SOLN
2.0000 g | Freq: Once | INTRAVENOUS | Status: AC
  Administered 2023-01-20: 2 g via INTRAVENOUS
  Filled 2023-01-20: qty 50

## 2023-01-20 MED ORDER — POTASSIUM CHLORIDE CRYS ER 20 MEQ PO TBCR
40.0000 meq | EXTENDED_RELEASE_TABLET | Freq: Once | ORAL | Status: AC
  Administered 2023-01-20: 40 meq via ORAL
  Filled 2023-01-20: qty 2

## 2023-01-20 MED ORDER — AZITHROMYCIN 250 MG PO TABS
500.0000 mg | ORAL_TABLET | Freq: Once | ORAL | Status: AC
  Administered 2023-01-20: 500 mg via ORAL
  Filled 2023-01-20: qty 2

## 2023-01-20 NOTE — ED Provider Notes (Signed)
[place] EMERGENCY DEPARTMENT AT [facility] Provider Note   CSN: [id number] Arrival date & time: 01/20/23  1030     History  Chief Complaint  Patient presents with   Asthma   Wheezing    Loay Iles. is a 32 y.o. male.  Patient complains of being short of breath.  Patient has a history of asthma.  Patient reports that this feels like an asthma attack.  Patient is currently being treated for neuroendocrine cancer.  Patient denies any fever or chills he denies any cough or congestion.  Patient denies being exposed to anyone with flu or COVID.  The history is provided by the patient. No language interpreter was used.  Asthma This is a new problem. The problem has not changed since onset.Associated symptoms include shortness of breath. Nothing aggravates the symptoms.  Wheezing Associated symptoms: shortness of breath        Home Medications Prior to Admission medications   Medication Sig Start Date End Date Taking? Authorizing Provider  acetaminophen (TYLENOL) 500 MG tablet Take 1,000 mg by mouth every 6 (six) hours as needed for mild pain.    [provider]  albuterol (VENTOLIN HFA) 108 (90 Base) MCG/ACT inhaler Inhale 1-2 puffs into the lungs every 6 (six) hours as needed for wheezing or shortness of breath. 09/25/22   [name], MD  dexamethasone (DECADRON) 4 MG tablet Take 2 tablets (8 mg total) by mouth daily. Start the day after chemotherapy for 2 days. Take with food. 09/25/22   [name], MD  lidocaine-prilocaine (EMLA) cream Apply to affected area once 07/24/22   [name], MD  loperamide (IMODIUM) 2 MG capsule Take 2 tabs by mouth with first loose stool, then 1 tab with each additional loose stool as needed. Do not exceed 8 tabs in a 24-hour period 07/24/22   [name], MD  ondansetron (ZOFRAN) 8 MG tablet Take 1 tablet (8 mg total) by mouth every 8 (eight) hours as needed for nausea, vomiting or  refractory nausea / vomiting. Start on the third day after chemotherapy. 07/24/22   [name], MD  oxyCODONE (OXY IR/ROXICODONE) 5 MG immediate release tablet Take 1 tablet (5 mg total) by mouth every 6 (six) hours as needed for severe pain or moderate pain. 11/27/21   [name], MD  polyethylene glycol (MIRALAX / GLYCOLAX) 17 g packet Take 17 g by mouth daily. 07/05/21   Regalado, Belkys A, MD  prochlorperazine (COMPAZINE) 10 MG tablet Take 1 tablet (10 mg total) by mouth every 6 (six) hours as needed for nausea or vomiting. 07/24/22   [name], MD  senna-docusate (SENOKOT-S) 8.6-50 MG tablet Take 2 tablets by mouth 2 (two) times daily. 07/05/21   Regalado, Jon Billings A, MD      Allergies    Shellfish allergy and Other    Review of Systems   Review of Systems  Respiratory:  Positive for shortness of breath and wheezing.   All other systems reviewed and are negative.   Physical Exam Updated Vital Signs BP (!) 134/92 (BP Location: Right Arm)   Pulse (!) 108   Temp 98.1 F (36.7 C) (Oral)   Resp 16   Ht 6' (1.829 m)   Wt (!) 141.1 kg   SpO2 97%   BMI 42.18 kg/m  Physical Exam Vitals and nursing note reviewed.  Constitutional:      Appearance: He is well-developed.  HENT:  Head: Normocephalic.     Mouth/Throat:     Mouth: Mucous membranes are moist.  Cardiovascular:     Rate and Rhythm: Normal rate.  Pulmonary:     Effort: Pulmonary effort is normal.     Breath sounds: Wheezing present.  Abdominal:     General: There is no distension.  Musculoskeletal:        General: Normal range of motion.     Cervical back: Normal range of motion.  Skin:    General: Skin is warm.  Neurological:     General: No focal deficit present.     Mental Status: He is alert and oriented to person, place, and time.  Psychiatric:        Mood and Affect: Mood normal.     ED Results / Procedures / Treatments   Labs (all labs ordered are listed, but only abnormal  results are displayed) Labs Reviewed  CBC WITH DIFFERENTIAL/PLATELET - Abnormal; Notable for the following components:      Result Value   RBC 3.88 (*)    Hemoglobin 11.4 (*)    HCT 33.5 (*)    RDW 16.8 (*)    Lymphs Abs 0.5 (*)    All other components within normal limits  COMPREHENSIVE METABOLIC PANEL - Abnormal; Notable for the following components:   Potassium 3.4 (*)    Glucose, Bld 111 (*)    Total Bilirubin 1.6 (*)    All other components within normal limits  RESP PANEL BY RT-PCR (RSV, FLU A&B, COVID)  [id number]    EKG None  Radiology CT Angio Chest PE W and/or Wo Contrast Result Date: 01/20/2023 CLINICAL DATA:  Cough wheezing chest tightness EXAM: CT ANGIOGRAPHY CHEST WITH CONTRAST TECHNIQUE: Multidetector CT imaging of the chest was performed using the standard protocol during bolus administration of intravenous contrast. Multiplanar CT image reconstructions and MIPs were obtained to evaluate the vascular anatomy. RADIATION DOSE REDUCTION: This exam was performed according to the departmental dose-optimization program which includes automated exposure control, adjustment of the mA and/or kV according to patient size and/or use of iterative reconstruction technique. CONTRAST:  90mL OMNIPAQUE IOHEXOL 350 MG/ML SOLN COMPARISON:  CT 01/17/2023 FINDINGS: Cardiovascular: Satisfactory opacification of the pulmonary arteries to the segmental level. No evidence of pulmonary embolism. Nonaneurysmal aorta. No dissection is seen. Right-sided central venous catheter tip at the right atrium. Normal cardiac size. No pericardial effusion Mediastinum/Nodes: Patent trachea. No thyroid mass. No suspicious lymph nodes. Esophagus within normal limits. Lungs/Pleura: Interim development of multifocal ground-glass densities most evident in the right upper lobe and left greater than right lower lobes. No pleural effusion or pneumothorax Upper Abdomen: Pneumobilia. Hypodense liver lesions corresponding to  history of metastatic disease. Splenomegaly. Incompletely visualized pancreatic mass and common duct stent. See recently performed CT chest abdomen pelvis. Musculoskeletal: No acute or suspicious osseous abnormality Review of the MIP images confirms the above findings. IMPRESSION: 1. Negative for acute pulmonary embolus or aortic dissection. 2. Interim development of multifocal ground-glass densities most evident in the right upper lobe and left greater than right lower lobes, suspicious for multifocal pneumonia. 3. Upper abdomen demonstrates pancreatic mass, hepatic metastatic disease, and splenomegaly. See recently performed CT chest abdomen pelvis. Electronically Signed   By: Jasmine Pang M.D.   On: 01/20/2023 15:33   DG Chest Portable 1 View Result Date: 01/20/2023 CLINICAL DATA:  Cough EXAM: PORTABLE CHEST 1 VIEW COMPARISON:  01/17/2023 FINDINGS: Right chest port in place with distal tip terminating the level of the  right atrium. The heart size and mediastinal contours are within normal limits. Both lungs are clear. The visualized skeletal structures are unremarkable. IMPRESSION: No active disease. Electronically Signed   By: Duanne Guess D.O.   On: 01/20/2023 12:01    Procedures Procedures    Medications Ordered in ED Medications  ipratropium-albuterol (DUONEB) 0.5-2.5 (3) MG/3ML nebulizer solution 3 mL (3 mLs Nebulization Given 01/20/23 1100)  predniSONE (DELTASONE) tablet 60 mg (60 mg Oral Given 01/20/23 1100)  albuterol (PROVENTIL) (2.5 MG/3ML) 0.083% nebulizer solution 2.5 mg (2.5 mg Nebulization Given 01/20/23 1258)  iohexol (OMNIPAQUE) 350 MG/ML injection 75 mL (90 mLs Intravenous Contrast Given 01/20/23 1343)    ED Course/ Medical Decision Making/ A&P                                 Medical Decision Making Patient complains of an asthma attack.  Patient reports fever chills  Amount and/or Complexity of Data Reviewed Labs: ordered.    Details: Labs ordered reviewed and  interpreted Radiology: ordered and independent interpretation performed. Decision-making details documented in ED Course.    Details: X-ray shows no acute findings  Risk Prescription drug management. Risk Details: Patient given prednisone 60 mg.  Patient given 10 mg continuous neb.           Final Clinical Impression(s) / ED Diagnoses Final diagnoses:  Moderate asthma with exacerbation, unspecified whether persistent    Rx / DC Orders ED Discharge Orders     None      Pt's care turned over to [facility]   [place] Areas, [place] 01/20/23 1541    Lorre Nick, MD 01/21/23 [phone]

## 2023-01-20 NOTE — H&P (Signed)
History and Physical    Patient: Leon Fischer. NAT:[id number] DOB: [birth year] DOA: 01/20/2023 DOS: the patient was seen and examined on 01/20/2023 PCP: Patient, No Pcp Per  Patient coming from: Home  Chief Complaint:  Chief Complaint  Patient presents with   Asthma   Wheezing   HPI: Leon Fischer. is a 32 y.o. male with medical history significant of Asthma, class III obesity with a BMI of 42.18 kg/m, pancreatic neuroendocrine metastatic tumor history of cholecystitis, cholecystectomy, gastritis and duodenitis, hypomagnesemia, pancreatic mass who presented to the emergency department complaints of dyspnea and wheezing.  He has been having cough that has slowly become productive.  No sick contacts or travel history. He denied fever, chills, rhinorrhea, sore throat or hemoptysis.  No chest pain, palpitations, diaphoresis, PND, orthopnea or pitting edema of the lower extremities.  No abdominal pain, nausea, emesis, diarrhea, constipation, melena or hematochezia.  No flank pain, dysuria, frequency or hematuria.  No polyuria, polydipsia, polyphagia or blurred vision.   Lab work: Coronavirus, influenza and RSV PCR was negative.  CBC showed a white count of 6.9 with 81% neutrophils, hemoglobin 11.4 g/dL and platelets 427.  CMP measurements were normal with the exception of a potassium of 3.4 mmol/L, glucose of 111 and total bilirubin of 1.6 mg/deciliter.  Imaging: Portable 1 view chest radiograph with no active disease.  CTA chest was negative for PE or aortic dissection.  There were interim development of multifocal groundglass densities most evident in the right upper lobe and left greater then right lower lobes, suspicious for multifocal pneumonia.  Upper abdomen demonstrated pancreatic mass, hepatic metastatic disease, and splenomegaly.  See recent CT chest/abdomen/pelvis.   ED course: Initial vital signs were temperature 99.3 F, pulse 124, respiration 20, BP 135/98 mmHg O2  sat 92% on room air.  The patient received prednisone 60 mg p.o., ceftriaxone 2 g IVPB, azithromycin 500 mg IVPB, a DuoNeb and an albuterol neb.  Review of Systems: As mentioned in the history of present illness. All other systems reviewed and are negative. Past Medical History:  Diagnosis Date   Asthma    Neuroendocrine cancer [facility])    Past Surgical History:  Procedure Laterality Date   BILIARY DILATION  07/13/2021   Procedure: BILIARY DILATION;  Surgeon: Vida Rigger, MD;  Location: [facility];  Service: Gastroenterology;;   BILIARY STENT PLACEMENT N/A 07/13/2021   Procedure: BILIARY STENT PLACEMENT;  Surgeon: Vida Rigger, MD;  Location: [name];  Service: Gastroenterology;  Laterality: N/A;   BILIARY STENT PLACEMENT N/A 11/05/2021   Procedure: BILIARY STENT PLACEMENT;  Surgeon: Vida Rigger, MD;  Location: [name];  Service: Gastroenterology;  Laterality: N/A;   BIOPSY  07/13/2021   Procedure: BIOPSY;  Surgeon: Meridee Score Netty Starring., MD;  Location: [facility];  Service: Gastroenterology;;   CHOLECYSTECTOMY N/A 11/07/2021   Procedure: LAPAROSCOPIC CHOLECYSTECTOMY;  Surgeon: Berna Bue, MD;  Location: [facility];  Service: General;  Laterality: N/A;   ENDOSCOPIC RETROGRADE CHOLANGIOPANCREATOGRAPHY (ERCP) WITH PROPOFOL N/A 07/13/2021   Procedure: ENDOSCOPIC RETROGRADE CHOLANGIOPANCREATOGRAPHY (ERCP) WITH PROPOFOL;  Surgeon: Vida Rigger, MD;  Location: [name];  Service: Gastroenterology;  Laterality: N/A;   ERCP N/A 11/05/2021   Procedure: ENDOSCOPIC RETROGRADE CHOLANGIOPANCREATOGRAPHY (ERCP);  Surgeon: Vida Rigger, MD;  Location: [facility];  Service: Gastroenterology;  Laterality: N/A;   ESOPHAGOGASTRODUODENOSCOPY (EGD) WITH PROPOFOL N/A 07/13/2021   Procedure: ESOPHAGOGASTRODUODENOSCOPY (EGD) WITH PROPOFOL;  Surgeon: Meridee Score Netty Starring., MD;  Location: [name];  Service: Gastroenterology;  Laterality: N/A;   EUS  N/A 07/13/2021   Procedure: UPPER ENDOSCOPIC ULTRASOUND  (EUS) LINEAR;  Surgeon: Lemar Lofty., MD;  Location: [name];  Service: Gastroenterology;  Laterality: N/A;   FINE NEEDLE ASPIRATION  07/13/2021   Procedure: FINE NEEDLE ASPIRATION (FNA) LINEAR;  Surgeon: Lemar Lofty., MD;  Location: [facility];  Service: Gastroenterology;;   IR IMAGING GUIDED PORT INSERTION  07/31/2021   SPHINCTEROTOMY  07/13/2021   Procedure: Dennison Mascot;  Surgeon: Vida Rigger, MD;  Location: [name];  Service: Gastroenterology;;   Social History:  reports that he has been smoking cigarettes. He has never used smokeless tobacco. He reports current alcohol use. He reports that he does not use drugs.  Allergies  Allergen Reactions   Shellfish Allergy Shortness Of Breath and Swelling    Eyes, throat swell shut.   Other     Family History  Problem Relation Age of Onset   Drug abuse Father     Prior to Admission medications   Medication Sig Start Date End Date Taking? Authorizing Provider  acetaminophen (TYLENOL) 500 MG tablet Take 1,000 mg by mouth every 6 (six) hours as needed for mild pain.    [provider]  albuterol (VENTOLIN HFA) 108 (90 Base) MCG/ACT inhaler Inhale 1-2 puffs into the lungs every 6 (six) hours as needed for wheezing or shortness of breath. 09/25/22   [name], MD  dexamethasone (DECADRON) 4 MG tablet Take 2 tablets (8 mg total) by mouth daily. Start the day after chemotherapy for 2 days. Take with food. 09/25/22   [name], MD  lidocaine-prilocaine (EMLA) cream Apply to affected area once 07/24/22   [name], MD  loperamide (IMODIUM) 2 MG capsule Take 2 tabs by mouth with first loose stool, then 1 tab with each additional loose stool as needed. Do not exceed 8 tabs in a 24-hour period 07/24/22   [name], MD  ondansetron (ZOFRAN) 8 MG tablet Take 1 tablet (8 mg total) by mouth every 8 (eight) hours as needed for nausea, vomiting or refractory nausea / vomiting. Start  on the third day after chemotherapy. 07/24/22   [name], MD  oxyCODONE (OXY IR/ROXICODONE) 5 MG immediate release tablet Take 1 tablet (5 mg total) by mouth every 6 (six) hours as needed for severe pain or moderate pain. 11/27/21   [name], MD  polyethylene glycol (MIRALAX / GLYCOLAX) 17 g packet Take 17 g by mouth daily. 07/05/21   Regalado, Belkys A, MD  prochlorperazine (COMPAZINE) 10 MG tablet Take 1 tablet (10 mg total) by mouth every 6 (six) hours as needed for nausea or vomiting. 07/24/22   [name], MD  senna-docusate (SENOKOT-S) 8.6-50 MG tablet Take 2 tablets by mouth 2 (two) times daily. 07/05/21   Alba Cory, MD    Physical Exam: Vitals:   01/20/23 1043 01/20/23 1045 01/20/23 1047 01/20/23 1510  BP: (!) 135/98   (!) 134/92  Pulse: (!) 124   (!) 108  Resp: 20  (!) 23 16  Temp: 99.3 F (37.4 C)   98.1 F (36.7 C)  TempSrc: Oral   Oral  SpO2: 92%   97%  Weight: (!) 141.1 kg (!) 141.1 kg    Height: 6\' 1"  (1.854 m) 6' (1.829 m)     Physical Exam Vitals and nursing note reviewed.  Constitutional:      General: He is awake. He is not in acute distress.    Appearance: He is morbidly obese. He is  ill-appearing.  HENT:     Head: Normocephalic.     Nose: No rhinorrhea.     Mouth/Throat:     Mouth: Mucous membranes are moist.  Eyes:     General: No scleral icterus.    Pupils: Pupils are equal, round, and reactive to light.  Neck:     Vascular: No JVD.  Cardiovascular:     Rate and Rhythm: Normal rate and regular rhythm.     Heart sounds: S1 normal and S2 normal.  Pulmonary:     Effort: Respiratory distress present.     Breath sounds: Decreased breath sounds, wheezing and rales present. No rhonchi.  Abdominal:     General: Bowel sounds are normal. There is no distension.     Palpations: Abdomen is soft.     Tenderness: There is no abdominal tenderness.  Musculoskeletal:     Cervical back: Neck supple.     Right lower leg: No  edema.     Left lower leg: No edema.  Skin:    General: Skin is warm and dry.  Neurological:     General: No focal deficit present.     Mental Status: He is alert and oriented to person, place, and time.  Psychiatric:        Mood and Affect: Mood normal.        Behavior: Behavior normal. Behavior is cooperative.     Data Reviewed:  Results are pending, will review when available. EKG: Vent. rate 122 BPM PR interval 160 ms QRS duration 83 ms QT/QTcB 318/453 ms P-R-T axes 40 73 28 Sinus tachycardia  Assessment and Plan: Principal Problem:   Multifocal pneumonia Superimposed on an exacerbating:   Asthma Admit to PCU/inpatient. Continue supplemental oxygen. Scheduled and as needed bronchodilators. Continue ceftriaxone 2 g IVPB daily. Continue azithromycin 500 mg IVPB daily. Check strep pneumoniae urinary antigen. Check sputum Gram stain, culture and sensitivity. Follow-up CBC and chemistry in the morning.  Active Problems:   Hypokalemia Replacing. Magnesium was supplemented. Follow-up potassium level in the morning.    Obesity, Class III, BMI 40-49.9 (morbid obesity) (HCC) Current BMI 42.18 kg/m. Lifestyle modifications. Follow-up with PCP.    Malignant poorly differentiated neuroendocrine carcinoma [facility]) Follow-up with oncology as scheduled.    Advance Care Planning:   Code Status: Full Code   Consults:   Family Communication:   Severity of Illness: The appropriate patient status for this patient is INPATIENT. Inpatient status is judged to be reasonable and necessary in order to provide the required intensity of service to ensure the patient's safety. The patient's presenting symptoms, physical exam findings, and initial radiographic and laboratory data in the context of their chronic comorbidities is felt to place them at high risk for further clinical deterioration. Furthermore, it is not anticipated that the patient will be medically stable for discharge  from the hospital within 2 midnights of admission.   * I certify that at the point of admission it is my clinical judgment that the patient will require inpatient hospital care spanning beyond 2 midnights from the point of admission due to high intensity of service, high risk for further deterioration and high frequency of surveillance required.*  Author: Bobette Mo, MD 01/20/2023 4:13 PM  For on call review [email or web address].   This document was prepared using Dragon voice recognition software and may contain some unintended transcription errors.

## 2023-01-20 NOTE — ED Triage Notes (Signed)
Pt arrives via POV. Pt reports cough, wheezing, and chest tightness since yesterday. Wheezing noted during triage. Pt AxOx4.

## 2023-01-20 NOTE — ED Provider Notes (Signed)
Handoff from [employer] PA. Patient is oncology pt, pending CTA. Receiving continuous nebulizer. Asthma exacerbation vs PE.   Physical Exam  BP (!) 135/98 (BP Location: Right Arm)   Pulse (!) 124   Temp 99.3 F (37.4 C) (Oral)   Resp (!) 23   Ht 6' (1.829 m)   Wt (!) 141.1 kg   SpO2 92%   BMI 42.18 kg/m   Physical Exam Vitals and nursing note reviewed.  Constitutional:      General: He is not in acute distress.    Appearance: He is well-developed.  HENT:     Head: Normocephalic and atraumatic.  Eyes:     Conjunctiva/sclera: Conjunctivae normal.  Cardiovascular:     Rate and Rhythm: Normal rate and regular rhythm.     Heart sounds: No murmur heard. Pulmonary:     Effort: Pulmonary effort is normal. No respiratory distress.     Breath sounds: Wheezing present.     Comments: Diffuse wheezing throughout. No respiratory distress noted. Abdominal:     Palpations: Abdomen is soft.     Tenderness: There is no abdominal tenderness.  Musculoskeletal:        General: No swelling.     Cervical back: Neck supple.  Skin:    General: Skin is warm and dry.     Capillary Refill: Capillary refill takes less than 2 seconds.  Neurological:     Mental Status: He is alert.  Psychiatric:        Mood and Affect: Mood normal.     Procedures  Procedures  ED Course / MDM    Medical Decision Making From Trisha Mangle, [place], pending CTA PE study to rule out a PE.  Patient has been short of breath with chest tightness for the last day.  He is currently undergoing chemotherapy.  CTA PE shows multifocal pneumonia no evidence of PE.  I evaluated him, he is in no respiratory distress, and feels little bit better after the nebulizer.  He is still diffusely wheezy, and since he is currently on chemotherapy, has active cancer, and has multifocal pneumonia, I believe this warrants admission.  Started on ceftriaxone and azithromycin, and admitted to Dr. Robb Matar.  Patient agreement with plan.  Afebrile,  nonhypoxic at my eval.  Currently is not meeting criteria for sepsis.  Amount and/or Complexity of Data Reviewed Labs: ordered. Radiology: ordered.  Risk Prescription drug management. Decision regarding hospitalization.         Pete Pelt, [place] 01/20/23 1621    Glendora Score, MD 01/21/23 1201

## 2023-01-21 DIAGNOSIS — J189 Pneumonia, unspecified organism: Secondary | ICD-10-CM | POA: Diagnosis not present

## 2023-01-21 LAB — COMPREHENSIVE METABOLIC PANEL
ALT: 19 U/L (ref 0–44)
AST: 19 U/L (ref 15–41)
Albumin: 3.8 g/dL (ref 3.5–5.0)
Alkaline Phosphatase: 79 U/L (ref 38–126)
Anion gap: 6 (ref 5–15)
BUN: 15 mg/dL (ref 6–20)
CO2: 25 mmol/L (ref 22–32)
Calcium: 9.2 mg/dL (ref 8.9–10.3)
Chloride: 108 mmol/L (ref 98–111)
Creatinine, Ser: 0.81 mg/dL (ref 0.61–1.24)
GFR, Estimated: >60 mL/min (ref >60)
Glucose, Bld: 98 mg/dL (ref 70–99)
Potassium: 3.5 mmol/L (ref 3.5–5.1)
Sodium: 139 mmol/L (ref 135–145)
Total Bilirubin: 0.9 mg/dL (ref <1.2)
Total Protein: 7.1 g/dL (ref 6.5–8.1)

## 2023-01-21 LAB — CBC
HCT: 32.9 % — ABNORMAL LOW (ref 39.0–52.0)
Hemoglobin: 10.5 g/dL — ABNORMAL LOW (ref 13.0–17.0)
MCH: 28.5 pg (ref 26.0–34.0)
MCHC: 31.9 g/dL (ref 30.0–36.0)
MCV: 89.2 fL (ref 80.0–100.0)
Platelets: 137 10*3/uL — ABNORMAL LOW (ref 150–400)
RBC: 3.69 MIL/uL — ABNORMAL LOW (ref 4.22–5.81)
RDW: 16.5 % — ABNORMAL HIGH (ref 11.5–15.5)
WBC: 6 10*3/uL (ref 4.0–10.5)
nRBC: 0 % (ref 0.0–0.2)

## 2023-01-21 LAB — HIV ANTIBODY (ROUTINE TESTING W REFLEX): HIV Screen 4th Generation wRfx: NONREACTIVE

## 2023-01-21 MED ORDER — ALBUTEROL SULFATE (2.5 MG/3ML) 0.083% IN NEBU: 2.5000 mg | INHALATION_SOLUTION | RESPIRATORY_TRACT | Status: DC | PRN

## 2023-01-21 MED ORDER — INFLUENZA VIRUS VACC SPLIT PF (FLUZONE) 0.5 ML IM SUSY
0.5000 mL | PREFILLED_SYRINGE | INTRAMUSCULAR | Status: AC
  Administered 2023-01-22: 0.5 mL via INTRAMUSCULAR
  Filled 2023-01-21: qty 0.5

## 2023-01-21 MED ORDER — IPRATROPIUM-ALBUTEROL 0.5-2.5 (3) MG/3ML IN SOLN
3.0000 mL | Freq: Four times a day (QID) | RESPIRATORY_TRACT | Status: DC
  Administered 2023-01-21 (×2): 3 mL via RESPIRATORY_TRACT
  Filled 2023-01-21 (×2): qty 3

## 2023-01-21 MED ORDER — PREDNISONE 20 MG PO TABS
40.0000 mg | ORAL_TABLET | Freq: Every day | ORAL | Status: DC
  Administered 2023-01-21 – 2023-01-23 (×3): 40 mg via ORAL
  Filled 2023-01-21 (×3): qty 2

## 2023-01-21 MED ORDER — IPRATROPIUM-ALBUTEROL 0.5-2.5 (3) MG/3ML IN SOLN
3.0000 mL | Freq: Two times a day (BID) | RESPIRATORY_TRACT | Status: DC
  Administered 2023-01-22 – 2023-01-23 (×3): 3 mL via RESPIRATORY_TRACT
  Filled 2023-01-21 (×3): qty 3

## 2023-01-21 NOTE — Progress Notes (Signed)
  [facility] Progress Note  Patient: Leon Fischer.     AOZ:[id number]  DOA: 01/20/2023   PCP: Patient, No Pcp Per       Brief hospital course: This is a 32 year old male with pancreatic neuroendocrine metastatic cancer, asthma, obesity, gastritis and duodenitis, history of cholecystectomy who presents to the hospital for dyspnea and wheezing along with a productive cough. In the ED, a CTA of the chest revealed groundglass densities in the right upper lobe and bilateral lower lobes left greater than right. The patient was getting prednisone, ceftriaxone and azithromycin along with nebulizer treatments.  Subjective:  Has mild dyspnea and cough today.   Assessment and Plan: Principal Problem:   Multifocal pneumonia in an immunocompromised patient - RR as high as 31 today-  -Continue ceftriaxone and azithromycin   Active Problems:   Asthma exacerbation - Secondary to above - Continue prednisone and Nebs - has ongoing mild wheezing at rest    Obesity, Class III, BMI 40-49.9 (morbid obesity) (HCC) Body mass index is 42.18 kg/m.    Malignant poorly differentiated neuroendocrine carcinoma (HCC) -Receiving palliative chemotherapy    Hypokalemia -Resolved  Mild thrombocytopenia - Follow      Code Status: Full Code Total time on patient care: 35 min DVT prophylaxis:  Lovenox     Objective:   Vitals:   01/21/23 0615 01/21/23 0737 01/21/23 0915 01/21/23 1100  BP: 119/62  129/85 127/78  Pulse: 85  99 (!) 103  Resp: 16  17 17   Temp:  97.9 F (36.6 C) 98 F (36.7 C)   TempSrc:  Oral Oral   SpO2: 91%  97% 94%  Weight:      Height:       Filed Weights   01/20/23 1043 01/20/23 1045  Weight: (!) 141.1 kg (!) 141.1 kg   Exam: General exam: Appears comfortable  HEENT: oral mucosa moist Respiratory system: b/l diffuse wheezing and cough Cardiovascular system: S1 & S2 heard  Gastrointestinal system: Abdomen soft, non-tender, nondistended. Normal  bowel sounds   Extremities: No cyanosis, clubbing or edema Psychiatry:  Mood & affect appropriate.      CBC: Recent Labs  Lab 01/20/23 1202 01/21/23 0641  WBC 6.9 6.0  NEUTROABS 5.6  --   HGB 11.4* 10.5*  HCT 33.5* 32.9*  MCV 86.3 89.2  PLT 153 137*   Basic Metabolic Panel: Recent Labs  Lab 01/20/23 1202 01/21/23 0641  NA 136 139  K 3.4* 3.5  CL 105 108  CO2 22 25  GLUCOSE 111* 98  BUN 10 15  CREATININE 0.81 0.81  CALCIUM 9.1 9.2     Scheduled Meds:  enoxaparin (LOVENOX) injection  70 mg Subcutaneous Q24H    Imaging and lab data personally reviewed   Author: Calvert Cantor  01/21/2023 12:03 PM  To contact [facility]>   Check the care team in [facility] and look for the attending/consulting [facility] provider listed  Log into [email or web address] and use [place]'s universal password   Go to> "[facility]"  and find provider  If you still have difficulty reaching the provider, please page the [facility] (Director on Call) for the Hospitalists listed on amion

## 2023-01-21 NOTE — Plan of Care (Signed)

## 2023-01-21 NOTE — Progress Notes (Signed)
   01/21/23 1942  BiPAP/CPAP/SIPAP  Reason BIPAP/CPAP not in use Non-compliant   Pt refused cpap tonight.  Pt stated he has never had a sleep study or been told he has OSA.  RN notified.

## 2023-01-21 NOTE — ED Notes (Signed)
ED TO INPATIENT HANDOFF REPORT  Name/Age/Gender Leon Fischer. 32 y.o. male  Code Status    Code Status Orders  (From admission, onward)           Start     Ordered   01/20/23 1726  Full code  Continuous       Question:  By:  Answer:  Consent: discussion documented in EHR   01/20/23 1730           Code Status History     Date Active Date Inactive Code Status Order ID Comments User Context   07/11/2021 0930 07/15/2021 1546 Full Code [id number]  Teddy Spike, DO Inpatient   07/02/2021 1707 07/05/2021 1757 Full Code [id number]  Darlin Drop, DO Inpatient       Home/SNF/Other Home  Chief Complaint Multifocal pneumonia [J18.9]  Level of Care/Admitting Diagnosis ED Disposition     ED Disposition  Admit   Condition  --   Comment  Hospital Area: [facility] [[id number]]  Level of Care: Telemetry [5]  Admit to tele based on following criteria: Monitor for Ischemic changes  May admit patient to [facility] or [facility] if equivalent level of care is available:: No  Covid Evaluation: Asymptomatic - no recent exposure (last 10 days) testing not required  Diagnosis: Multifocal pneumonia [7829562]  Admitting Physician: Bobette Mo [[id number]]  Attending Physician: Bobette Mo [[id number]]  Certification:: I certify this patient will need inpatient services for at least 2 midnights  Expected Medical Readiness: 01/22/2023          Medical History Past Medical History:  Diagnosis Date   Asthma    Neuroendocrine cancer (HCC)     Allergies Allergies  Allergen Reactions   Shellfish Allergy Anaphylaxis, Shortness Of Breath, Swelling and Other (See Comments)    Eyes, throat swell shut, and the face swells    IV Location/Drains/Wounds Patient Lines/Drains/Airways Status     Active Line/Drains/Airways     Name Placement date Placement time Site Days   Implanted Port 04/18/22 Right Chest 04/18/22  0200  Chest  278    Closed System Drain 1 Right;Lateral Abdomen Bulb (JP) 19 Fr. 11/07/21  --  Abdomen  440   GI Stent 11/05/21  1230  --  442   Incision - 4 Ports Abdomen 1: Left;Upper 2: Right;Lateral;Upper 3: Medial;Upper 4: Umbilicus 11/07/21  --  -- 440            Labs/Imaging Results for orders placed or performed during the hospital encounter of 01/20/23 (from the past 48 hours)  CBC with Differential     Status: Abnormal   Collection Time: 01/20/23 12:02 PM  Result Value Ref Range   WBC 6.9 4.0 - 10.5 K/uL   RBC 3.88 (L) 4.22 - 5.81 MIL/uL   Hemoglobin 11.4 (L) 13.0 - 17.0 g/dL   HCT 52.8 (L) 41.3 - 24.4 %   MCV 86.3 80.0 - 100.0 fL   MCH 29.4 26.0 - 34.0 pg   MCHC 34.0 30.0 - 36.0 g/dL   RDW 01.0 (H) 27.2 - 53.6 %   Platelets 153 150 - 400 K/uL   nRBC 0.0 0.0 - 0.2 %   Neutrophils Relative % 81 %   Neutro Abs 5.6 1.7 - 7.7 K/uL   Lymphocytes Relative 7 %   Lymphs Abs 0.5 (L) 0.7 - 4.0 K/uL   Monocytes Relative 6 %   Monocytes Absolute 0.4 0.1 - 1.0 K/uL   Eosinophils  Relative 6 %   Eosinophils Absolute 0.4 0.0 - 0.5 K/uL   Basophils Relative 0 %   Basophils Absolute 0.0 0.0 - 0.1 K/uL   Immature Granulocytes 0 %   Abs Immature Granulocytes 0.02 0.00 - 0.07 K/uL    Comment: Performed at [facility], [address] [place], [place] [address]  Comprehensive metabolic panel     Status: Abnormal   Collection Time: 01/20/23 12:02 PM  Result Value Ref Range   Sodium 136 135 - 145 mmol/L   Potassium 3.4 (L) 3.5 - 5.1 mmol/L   Chloride 105 98 - 111 mmol/L   CO2 22 22 - 32 mmol/L   Glucose, Bld 111 (H) 70 - 99 mg/dL    Comment: Glucose reference range applies only to samples taken after fasting for at least 8 hours.   BUN 10 6 - 20 mg/dL   Creatinine, Ser 8.41 0.61 - 1.24 mg/dL   Calcium 9.1 8.9 - 32.4 mg/dL   Total Protein 7.9 6.5 - 8.1 g/dL   Albumin 4.3 3.5 - 5.0 g/dL   AST 24 15 - 41 U/L   ALT 20 0 - 44 U/L   Alkaline Phosphatase 92 38 - 126 U/L   Total  Bilirubin 1.6 (H) <1.2 mg/dL   GFR, Estimated >40 >10 mL/min    Comment: (NOTE) Calculated using the CKD-EPI Creatinine Equation (2021)    Anion gap 9 5 - 15    Comment: Performed at [facility], [address] [place], [place] [address]  Resp panel by RT-PCR (RSV, Flu A&B, Covid) Anterior Nasal Swab     Status: None   Collection Time: 01/20/23  3:25 PM   Specimen: Anterior Nasal Swab  Result Value Ref Range   SARS Coronavirus 2 by RT PCR NEGATIVE NEGATIVE    Comment: (NOTE) SARS-CoV-2 target nucleic acids are NOT DETECTED.  The SARS-CoV-2 RNA is generally detectable in upper respiratory specimens during the acute phase of infection. The lowest concentration of SARS-CoV-2 viral copies this assay can detect is 138 copies/mL. A negative result does not preclude SARS-Cov-2 infection and should not be used as the sole basis for treatment or other patient management decisions. A negative result may occur with  improper specimen collection/handling, submission of specimen other than nasopharyngeal swab, presence of viral mutation(s) within the areas targeted by this assay, and inadequate number of viral copies(<138 copies/mL). A negative result must be combined with clinical observations, patient history, and epidemiological information. The expected result is Negative.  Fact Sheet for Patients:  BloggerCourse.com  Fact Sheet for Healthcare Providers:  SeriousBroker.it  This test is no t yet approved or cleared by the [place] FDA and  has been authorized for detection and/or diagnosis of SARS-CoV-2 by FDA under an Emergency Use Authorization (EUA). This EUA will remain  in effect (meaning this test can be used) for the duration of the COVID-19 declaration under Section 564(b)(1) of the Act, 21 U.S.C.section 360bbb-3(b)(1), unless the authorization is terminated  or revoked sooner.       Influenza A by  PCR NEGATIVE NEGATIVE   Influenza B by PCR NEGATIVE NEGATIVE    Comment: (NOTE) The Xpert Xpress SARS-CoV-2/FLU/RSV plus assay is intended as an aid in the diagnosis of influenza from Nasopharyngeal swab specimens and should not be used as a sole basis for treatment. Nasal washings and aspirates are unacceptable for Xpert Xpress SARS-CoV-2/FLU/RSV testing.  Fact Sheet for Patients: BloggerCourse.com  Fact Sheet for Healthcare Providers:  SeriousBroker.it  This test is not yet approved or cleared by the [place] and has been authorized for detection and/or diagnosis of SARS-CoV-2 by FDA under an Emergency Use Authorization (EUA). This EUA will remain in effect (meaning this test can be used) for the duration of the COVID-19 declaration under Section 564(b)(1) of the Act, 21 U.S.C. section 360bbb-3(b)(1), unless the authorization is terminated or revoked.     Resp Syncytial Virus by PCR NEGATIVE NEGATIVE    Comment: (NOTE) Fact Sheet for Patients: BloggerCourse.com  Fact Sheet for Healthcare Providers: SeriousBroker.it  This test is not yet approved or cleared by the [place] FDA and has been authorized for detection and/or diagnosis of SARS-CoV-2 by FDA under an Emergency Use Authorization (EUA). This EUA will remain in effect (meaning this test can be used) for the duration of the COVID-19 declaration under Section 564(b)(1) of the Act, 21 U.S.C. section 360bbb-3(b)(1), unless the authorization is terminated or revoked.  Performed at [facility], [address], [place], [place] [address]   HIV Antibody (routine testing w rflx)     Status: None   Collection Time: 01/21/23  6:41 AM  Result Value Ref Range   HIV Screen 4th Generation wRfx Non Reactive Non Reactive    Comment: Performed at [facility], [address] [place],  [place] [address]  Comprehensive metabolic panel     Status: None   Collection Time: 01/21/23  6:41 AM  Result Value Ref Range   Sodium 139 135 - 145 mmol/L   Potassium 3.5 3.5 - 5.1 mmol/L   Chloride 108 98 - 111 mmol/L   CO2 25 22 - 32 mmol/L   Glucose, Bld 98 70 - 99 mg/dL    Comment: Glucose reference range applies only to samples taken after fasting for at least 8 hours.   BUN 15 6 - 20 mg/dL   Creatinine, Ser 0.98 0.61 - 1.24 mg/dL   Calcium 9.2 8.9 - 11.9 mg/dL   Total Protein 7.1 6.5 - 8.1 g/dL   Albumin 3.8 3.5 - 5.0 g/dL   AST 19 15 - 41 U/L   ALT 19 0 - 44 U/L   Alkaline Phosphatase 79 38 - 126 U/L   Total Bilirubin 0.9 <1.2 mg/dL   GFR, Estimated >14 >78 mL/min    Comment: (NOTE) Calculated using the CKD-EPI Creatinine Equation (2021)    Anion gap 6 5 - 15    Comment: Performed at [facility], [address] [place], [place] [address]  CBC     Status: Abnormal   Collection Time: 01/21/23  6:41 AM  Result Value Ref Range   WBC 6.0 4.0 - 10.5 K/uL   RBC 3.69 (L) 4.22 - 5.81 MIL/uL   Hemoglobin 10.5 (L) 13.0 - 17.0 g/dL   HCT 13.0 (L) 86.5 - 78.4 %   MCV 89.2 80.0 - 100.0 fL   MCH 28.5 26.0 - 34.0 pg   MCHC 31.9 30.0 - 36.0 g/dL   RDW 69.6 (H) 29.5 - 28.4 %   Platelets 137 (L) 150 - 400 K/uL   nRBC 0.0 0.0 - 0.2 %    Comment: Performed at [facility], [address], [place], [place] [address]   CT Angio Chest PE W and/or Wo Contrast Result Date: 01/20/2023 CLINICAL DATA:  Cough wheezing chest tightness EXAM: CT ANGIOGRAPHY CHEST WITH CONTRAST TECHNIQUE: Multidetector CT imaging of the chest was performed using the standard protocol during  bolus administration of intravenous contrast. Multiplanar CT image reconstructions and MIPs were obtained to evaluate the vascular anatomy. RADIATION DOSE REDUCTION: This exam was performed according to the departmental dose-optimization program which includes automated exposure control, adjustment  of the mA and/or kV according to patient size and/or use of iterative reconstruction technique. CONTRAST:  90mL OMNIPAQUE IOHEXOL 350 MG/ML SOLN COMPARISON:  CT 01/17/2023 FINDINGS: Cardiovascular: Satisfactory opacification of the pulmonary arteries to the segmental level. No evidence of pulmonary embolism. Nonaneurysmal aorta. No dissection is seen. Right-sided central venous catheter tip at the right atrium. Normal cardiac size. No pericardial effusion Mediastinum/Nodes: Patent trachea. No thyroid mass. No suspicious lymph nodes. Esophagus within normal limits. Lungs/Pleura: Interim development of multifocal ground-glass densities most evident in the right upper lobe and left greater than right lower lobes. No pleural effusion or pneumothorax Upper Abdomen: Pneumobilia. Hypodense liver lesions corresponding to history of metastatic disease. Splenomegaly. Incompletely visualized pancreatic mass and common duct stent. See recently performed CT chest abdomen pelvis. Musculoskeletal: No acute or suspicious osseous abnormality Review of the MIP images confirms the above findings. IMPRESSION: 1. Negative for acute pulmonary embolus or aortic dissection. 2. Interim development of multifocal ground-glass densities most evident in the right upper lobe and left greater than right lower lobes, suspicious for multifocal pneumonia. 3. Upper abdomen demonstrates pancreatic mass, hepatic metastatic disease, and splenomegaly. See recently performed CT chest abdomen pelvis. Electronically Signed   By: Jasmine Pang M.D.   On: 01/20/2023 15:33   DG Chest Portable 1 View Result Date: 01/20/2023 CLINICAL DATA:  Cough EXAM: PORTABLE CHEST 1 VIEW COMPARISON:  01/17/2023 FINDINGS: Right chest port in place with distal tip terminating the level of the right atrium. The heart size and mediastinal contours are within normal limits. Both lungs are clear. The visualized skeletal structures are unremarkable. IMPRESSION: No active  disease. Electronically Signed   By: Duanne Guess D.O.   On: 01/20/2023 12:01    Pending Labs Unresulted Labs (From admission, onward)     Start     Ordered   01/20/23 1730  Expectorated Sputum Assessment w Gram Stain, Rflx to Resp Cult  (COPD / Pneumonia / Cellulitis / Lower Extremity Wound)  Once,   R        01/20/23 1730   01/20/23 1730  Strep pneumoniae urinary antigen  (COPD / Pneumonia / Cellulitis / Lower Extremity Wound)  Once,   R        01/20/23 1730            Vitals/Pain Today's Vitals   01/21/23 1100 01/21/23 1245 01/21/23 1315 01/21/23 1358  BP: 127/78 (!) 144/93 (!) 154/92   Pulse: (!) 103 (!) 103 (!) 110   Resp: 17 (!) 29 18   Temp:    98.5 F (36.9 C)  TempSrc:    Oral  SpO2: 94% 97% 92%   Weight:      Height:      PainSc:        Isolation Precautions No active isolations  Medications Medications  cefTRIAXone (ROCEPHIN) 2 g in sodium chloride 0.9 % 100 mL IVPB (has no administration in time range)  azithromycin (ZITHROMAX) 500 mg in sodium chloride 0.9 % 250 mL IVPB (has no administration in time range)  acetaminophen (TYLENOL) tablet 650 mg (has no administration in time range)    Or  acetaminophen (TYLENOL) suppository 650 mg (has no administration in time range)  prochlorperazine (COMPAZINE) injection 10 mg (has no administration in time range)  enoxaparin (LOVENOX) injection 70 mg (70 mg Subcutaneous Given 01/20/23 2209)  ipratropium-albuterol (DUONEB) 0.5-2.5 (3) MG/3ML nebulizer solution 3 mL (3 mLs Nebulization Given 01/20/23 1100)  predniSONE (DELTASONE) tablet 60 mg (60 mg Oral Given 01/20/23 1100)  albuterol (PROVENTIL) (2.5 MG/3ML) 0.083% nebulizer solution 2.5 mg (2.5 mg Nebulization Given 01/20/23 1258)  iohexol (OMNIPAQUE) 350 MG/ML injection 75 mL (90 mLs Intravenous Contrast Given 01/20/23 1343)  cefTRIAXone (ROCEPHIN) 1 g in sodium chloride 0.9 % 100 mL IVPB (0 g Intravenous Stopped 01/20/23 1700)  azithromycin (ZITHROMAX) tablet  500 mg (500 mg Oral Given 01/20/23 1623)  cefTRIAXone (ROCEPHIN) 1 g in sodium chloride 0.9 % 100 mL IVPB (0 g Intravenous Stopped 01/20/23 1800)  potassium chloride SA (KLOR-CON M) CR tablet 40 mEq (40 mEq Oral Given 01/20/23 1957)  magnesium sulfate IVPB 2 g 50 mL (0 g Intravenous Stopped 01/20/23 2119)    Mobility walks  A&Ox4

## 2023-01-22 ENCOUNTER — Inpatient Hospital Stay: Payer: Medicaid Other | Admitting: Hematology

## 2023-01-22 ENCOUNTER — Inpatient Hospital Stay: Payer: Medicaid Other

## 2023-01-22 DIAGNOSIS — J189 Pneumonia, unspecified organism: Secondary | ICD-10-CM | POA: Diagnosis not present

## 2023-01-22 LAB — CBC
HCT: 29.3 % — ABNORMAL LOW (ref 39.0–52.0)
Hemoglobin: 9.3 g/dL — ABNORMAL LOW (ref 13.0–17.0)
MCH: 28.4 pg (ref 26.0–34.0)
MCHC: 31.7 g/dL (ref 30.0–36.0)
MCV: 89.6 fL (ref 80.0–100.0)
Platelets: 121 10*3/uL — ABNORMAL LOW (ref 150–400)
RBC: 3.27 MIL/uL — ABNORMAL LOW (ref 4.22–5.81)
RDW: 16.6 % — ABNORMAL HIGH (ref 11.5–15.5)
WBC: 4.4 10*3/uL (ref 4.0–10.5)
nRBC: 0 % (ref 0.0–0.2)

## 2023-01-22 LAB — STREP PNEUMONIAE URINARY ANTIGEN: Strep Pneumo Urinary Antigen: NEGATIVE

## 2023-01-22 LAB — PROCALCITONIN: Procalcitonin: <0.1 ng/mL

## 2023-01-22 MED ORDER — CHLORHEXIDINE GLUCONATE CLOTH 2 % EX PADS
6.0000 | MEDICATED_PAD | Freq: Every day | CUTANEOUS | Status: DC
  Administered 2023-01-22 – 2023-01-23 (×2): 6 via TOPICAL

## 2023-01-22 MED ORDER — AZITHROMYCIN 250 MG PO TABS
500.0000 mg | ORAL_TABLET | Freq: Every day | ORAL | Status: DC
  Administered 2023-01-22 – 2023-01-23 (×2): 500 mg via ORAL
  Filled 2023-01-22 (×2): qty 2

## 2023-01-22 NOTE — Progress Notes (Addendum)
PROGRESS NOTE   Leon Fischer.  QIO:[id number]    DOB: 1991/01/28    DOA: 01/20/2023  PCP: Patient, No Pcp Per   I have briefly reviewed patients previous medical records in [facility].  Chief Complaint  Patient presents with   Asthma   Wheezing    Brief Hospital Course:  31 year old male with pancreatic neuroendocrine metastatic cancer, asthma, obesity, gastritis and duodenitis, history of cholecystectomy who presents to the hospital for dyspnea and wheezing along with a productive cough. CTA of the chest revealed groundglass densities in the right upper lobe and bilateral lower lobes left greater than right.   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  HIV screen negative. Check procalcitonin and RVP. Continue empirically started IV ceftriaxone and azithromycin, day 3 today  Asthma exacerbation Reports infrequent exacerbation at home and rarely uses his as needed albuterol inhaler Continue prednisone 40 Mg daily, bronchodilator nebs and antibiotics as above Does not follow with pulmonologist  Malignant poorly differentiated neuroendocrine carcinoma Receiving palliative chemotherapy Follows with Dr. Candise Che Chemotherapy for 12/18 reportedly has been postponed  Hypokalemia Replaced  Anemia and thrombocytopenia May be related to recent chemotherapy last week Follow CBC tomorrow  Body mass index is 42.18 kg/m./Obesity Outpatient follow-up.  DVT prophylaxis:   Lovenox   Code Status: Full Code:  Family Communication: None at bedside Disposition:  Status is: Inpatient Remains inpatient appropriate  because: IV antibiotics, ongoing tachypnea     Consultants:     Procedures:     Antimicrobials:   As above   Subjective:  Patient reports that he feels better.  Dry cough has improved but not resolved.  Denies dyspnea both at rest or with exertion but according to nursing, dyspnea on exertion while ambulating to bathroom.  Rest of history as noted above.  Objective:   Vitals:   01/21/23 2010 01/22/23 0417 01/22/23 0419 01/22/23 0756  BP: (!) 149/82 118/69 118/69   Pulse:  96 96   Resp: 16 16 (!) 22   Temp: 98.6 F (37 C) 98 F (36.7 C) 98 F (36.7 C)   TempSrc: Oral Oral Oral   SpO2: 94% 99% 99% 97%  Weight:      Height:        General exam: Young male, moderately built and obese sitting up in bed.  Does look mildly dyspneic and tachypneic even at rest Respiratory system: Slightly diminished and harsh breath sounds bilaterally with scattered occasional expiratory rhonchi both anteriorly and posteriorly.  No crackles or wheezing.  Mild increased work of breathing. Cardiovascular system: S1 & S2 heard, RRR. No JVD, murmurs, rubs, gallops or clicks. No pedal edema.  Telemetry personally reviewed: Had mild sinus tachycardia earlier but this has resolved and currently in sinus rhythm. Gastrointestinal system: Abdomen is nondistended, soft and nontender. No organomegaly or masses felt. Normal bowel sounds heard. Central nervous system: Alert and oriented. No focal neurological deficits. Extremities: Symmetric 5 x 5 power. Skin: No rashes, lesions or ulcers Psychiatry: Judgement and insight appear normal. Mood & affect appropriate.     Data Reviewed:   I have personally reviewed following labs and imaging studies   CBC: Recent  Labs  Lab 01/20/23 1202 01/21/23 0641 01/22/23 0530  WBC 6.9 6.0 4.4  NEUTROABS 5.6  --   --   HGB 11.4* 10.5* 9.3*  HCT 33.5* 32.9* 29.3*  MCV 86.3 89.2 89.6  PLT 153 137* 121*    Basic Metabolic Panel: Recent Labs  Lab  01/20/23 1202 01/21/23 0641  NA 136 139  K 3.4* 3.5  CL 105 108  CO2 22 25  GLUCOSE 111* 98  BUN 10 15  CREATININE 0.81 0.81  CALCIUM 9.1 9.2    Liver Function Tests: Recent Labs  Lab 01/20/23 1202 01/21/23 0641  AST 24 19  ALT 20 19  ALKPHOS 92 79  BILITOT 1.6* 0.9  PROT 7.9 7.1  ALBUMIN 4.3 3.8    CBG: No results for input(s): "GLUCAP" in the last 168 hours.  Microbiology Studies:   Recent Results (from the past 240 hours)  Resp panel by RT-PCR (RSV, Flu A&B, Covid) Anterior Nasal Swab     Status: None   Collection Time: 01/20/23  3:25 PM   Specimen: Anterior Nasal Swab  Result Value Ref Range Status   SARS Coronavirus 2 by RT PCR NEGATIVE NEGATIVE Final    Comment: (NOTE) SARS-CoV-2 target nucleic acids are NOT DETECTED.  The SARS-CoV-2 RNA is generally detectable in upper respiratory specimens during the acute phase of infection. The lowest concentration of SARS-CoV-2 viral copies this assay can detect is 138 copies/mL. A negative result does not preclude SARS-Cov-2 infection and should not be used as the sole basis for treatment or other patient management decisions. A negative result may occur with  improper specimen collection/handling, submission of specimen other than nasopharyngeal swab, presence of viral mutation(s) within the areas targeted by this assay, and inadequate number of viral copies(<138 copies/mL). A negative result must be combined with clinical observations, patient history, and epidemiological information. The expected result is Negative.  Fact Sheet for Patients:  BloggerCourse.com  Fact Sheet for Healthcare Providers:  SeriousBroker.it  This test is no t yet approved or cleared by the [place] FDA and  has been authorized for detection and/or diagnosis of SARS-CoV-2 by FDA under an Emergency Use Authorization (EUA). This EUA will remain  in effect (meaning this test can be  used) for the duration of the COVID-19 declaration under Section 564(b)(1) of the Act, 21 U.S.C.section 360bbb-3(b)(1), unless the authorization is terminated  or revoked sooner.       Influenza A by PCR NEGATIVE NEGATIVE Final   Influenza B by PCR NEGATIVE NEGATIVE Final    Comment: (NOTE) The Xpert Xpress SARS-CoV-2/FLU/RSV plus assay is intended as an aid in the diagnosis of influenza from Nasopharyngeal swab specimens and should not be used as a sole basis for treatment. Nasal washings and aspirates are unacceptable for Xpert Xpress SARS-CoV-2/FLU/RSV testing.  Fact Sheet for Patients: BloggerCourse.com  Fact Sheet for Healthcare Providers: SeriousBroker.it  This test is not yet approved or cleared by the [place] FDA and has been authorized for detection and/or diagnosis of SARS-CoV-2 by FDA under an Emergency Use Authorization (EUA). This EUA will remain in effect (meaning this test can be used) for the duration of the COVID-19 declaration under Section 564(b)(1) of the Act, 21 U.S.C. section 360bbb-3(b)(1), unless the authorization is terminated or revoked.     Resp Syncytial Virus by PCR NEGATIVE NEGATIVE Final    Comment: (NOTE) Fact Sheet for Patients: BloggerCourse.com  Fact Sheet for Healthcare Providers: SeriousBroker.it  This test is not yet approved or cleared  by the [place] and has been authorized for detection and/or diagnosis of SARS-CoV-2 by FDA under an Emergency Use Authorization (EUA). This EUA will remain in effect (meaning this test can be used) for the duration of the COVID-19 declaration under Section 564(b)(1) of the Act, 21 U.S.C. section 360bbb-3(b)(1), unless the authorization is terminated or revoked.  Performed at [facility], [address] [place], [place] [address]     Radiology Studies:  CT Angio  Chest PE W and/or Wo Contrast Result Date: 01/20/2023 CLINICAL DATA:  Cough wheezing chest tightness EXAM: CT ANGIOGRAPHY CHEST WITH CONTRAST TECHNIQUE: Multidetector CT imaging of the chest was performed using the standard protocol during bolus administration of intravenous contrast. Multiplanar CT image reconstructions and MIPs were obtained to evaluate the vascular anatomy. RADIATION DOSE REDUCTION: This exam was performed according to the departmental dose-optimization program which includes automated exposure control, adjustment of the mA and/or kV according to patient size and/or use of iterative reconstruction technique. CONTRAST:  90mL OMNIPAQUE IOHEXOL 350 MG/ML SOLN COMPARISON:  CT 01/17/2023 FINDINGS: Cardiovascular: Satisfactory opacification of the pulmonary arteries to the segmental level. No evidence of pulmonary embolism. Nonaneurysmal aorta. No dissection is seen. Right-sided central venous catheter tip at the right atrium. Normal cardiac size. No pericardial effusion Mediastinum/Nodes: Patent trachea. No thyroid mass. No suspicious lymph nodes. Esophagus within normal limits. Lungs/Pleura: Interim development of multifocal ground-glass densities most evident in the right upper lobe and left greater than right lower lobes. No pleural effusion or pneumothorax Upper Abdomen: Pneumobilia. Hypodense liver lesions corresponding to history of metastatic disease. Splenomegaly. Incompletely visualized pancreatic mass and common duct stent. See recently performed CT chest abdomen pelvis. Musculoskeletal: No acute or suspicious osseous abnormality Review of the MIP images confirms the above findings. IMPRESSION: 1. Negative for acute pulmonary embolus or aortic dissection. 2. Interim development of multifocal ground-glass densities most evident in the right upper lobe and left greater than right lower lobes, suspicious for multifocal pneumonia. 3. Upper abdomen demonstrates pancreatic mass, hepatic  metastatic disease, and splenomegaly. See recently performed CT chest abdomen pelvis. Electronically Signed   By: Jasmine Pang M.D.   On: 01/20/2023 15:33   DG Chest Portable 1 View Result Date: 01/20/2023 CLINICAL DATA:  Cough EXAM: PORTABLE CHEST 1 VIEW COMPARISON:  01/17/2023 FINDINGS: Right chest port in place with distal tip terminating the level of the right atrium. The heart size and mediastinal contours are within normal limits. Both lungs are clear. The visualized skeletal structures are unremarkable. IMPRESSION: No active disease. Electronically Signed   By: Duanne Guess D.O.   On: 01/20/2023 12:01    Scheduled Meds:    Chlorhexidine Gluconate Cloth  6 each Topical Daily   enoxaparin (LOVENOX) injection  70 mg Subcutaneous Q24H   ipratropium-albuterol  3 mL Nebulization BID   predniSONE  40 mg Oral Q breakfast    Continuous Infusions:    azithromycin 500 mg (01/21/23 1449)   cefTRIAXone (ROCEPHIN)  IV Stopped (01/21/23 1822)     LOS: 2 days     Marcellus Scott, MD,  FACP, [facility], [facility], [facility]   [facility] [place]      To contact the attending provider between 7A-7P or the covering provider during after hours 7P-7A, please log into the web site [email or web address] and access using universal [place] password for that web site. If you do not have the password, please call the hospital operator.  01/22/2023, 11:18 AM

## 2023-01-22 NOTE — Plan of Care (Signed)
  Problem: Education: Goal: Knowledge of General Education information will improve Description: Including pain rating scale, medication(s)/side effects and non-pharmacologic comfort measures Outcome: Progressing   Problem: Health Behavior/Discharge Planning: Goal: Ability to manage health-related needs will improve Outcome: Progressing   Problem: Clinical Measurements: Goal: Ability to maintain clinical measurements within normal limits will improve Outcome: Progressing Goal: Will remain free from infection Outcome: Progressing Goal: Diagnostic test results will improve Outcome: Progressing Goal: Respiratory complications will improve Outcome: Progressing Goal: Cardiovascular complication will be avoided Outcome: Progressing   Problem: Activity: Goal: Risk for activity intolerance will decrease Outcome: Progressing   Problem: Nutrition: Goal: Adequate nutrition will be maintained Outcome: Progressing   Problem: Coping: Goal: Level of anxiety will decrease Outcome: Progressing   Problem: Elimination: Goal: Will not experience complications related to bowel motility Outcome: Progressing   Problem: Pain Management: Goal: General experience of comfort will improve Outcome: Progressing   Problem: Safety: Goal: Ability to remain free from injury will improve Outcome: Progressing   Problem: Skin Integrity: Goal: Risk for impaired skin integrity will decrease Outcome: Progressing   Problem: Respiratory: Goal: Ability to maintain adequate ventilation will improve Outcome: Progressing Goal: Ability to maintain a clear airway will improve Outcome: Progressing

## 2023-01-22 NOTE — Progress Notes (Signed)
   01/22/23 2309  BiPAP/CPAP/SIPAP  Reason BIPAP/CPAP not in use Non-compliant

## 2023-01-23 DIAGNOSIS — J189 Pneumonia, unspecified organism: Secondary | ICD-10-CM | POA: Diagnosis not present

## 2023-01-23 LAB — RESPIRATORY PANEL BY PCR

## 2023-01-23 LAB — CBC WITH DIFFERENTIAL/PLATELET
Abs Immature Granulocytes: 0.01 10*3/uL (ref 0.00–0.07)
Basophils Absolute: 0 10*3/uL (ref 0.0–0.1)
Basophils Relative: 0 %
Eosinophils Absolute: 0.1 10*3/uL (ref 0.0–0.5)
Eosinophils Relative: 4 %
HCT: 30.6 % — ABNORMAL LOW (ref 39.0–52.0)
Hemoglobin: 9.7 g/dL — ABNORMAL LOW (ref 13.0–17.0)
Immature Granulocytes: 0 %
Lymphocytes Relative: 24 %
Lymphs Abs: 0.7 10*3/uL (ref 0.7–4.0)
MCH: 28.7 pg (ref 26.0–34.0)
MCHC: 31.7 g/dL (ref 30.0–36.0)
MCV: 90.5 fL (ref 80.0–100.0)
Monocytes Absolute: 0.3 10*3/uL (ref 0.1–1.0)
Monocytes Relative: 12 %
Neutro Abs: 1.6 10*3/uL — ABNORMAL LOW (ref 1.7–7.7)
Neutrophils Relative %: 60 %
Platelets: 116 10*3/uL — ABNORMAL LOW (ref 150–400)
RBC: 3.38 MIL/uL — ABNORMAL LOW (ref 4.22–5.81)
RDW: 16.6 % — ABNORMAL HIGH (ref 11.5–15.5)
WBC: 2.7 10*3/uL — ABNORMAL LOW (ref 4.0–10.5)
nRBC: 0 % (ref 0.0–0.2)

## 2023-01-23 MED ORDER — ALBUTEROL SULFATE HFA 108 (90 BASE) MCG/ACT IN AERS: 1.0000 | INHALATION_SPRAY | Freq: Four times a day (QID) | RESPIRATORY_TRACT | 0 refills | Status: AC | PRN

## 2023-01-23 MED ORDER — AZITHROMYCIN 500 MG PO TABS: 500.0000 mg | ORAL_TABLET | Freq: Once | ORAL | 0 refills | Status: AC

## 2023-01-23 MED ORDER — AMOXICILLIN-POT CLAVULANATE 875-125 MG PO TABS: 1.0000 | ORAL_TABLET | Freq: Two times a day (BID) | ORAL | 0 refills | Status: AC

## 2023-01-23 MED ORDER — PREDNISONE 10 MG PO TABS: ORAL_TABLET | ORAL | 0 refills | Status: DC

## 2023-01-23 MED ORDER — HEPARIN SOD (PORK) LOCK FLUSH 100 UNIT/ML IV SOLN
INTRAVENOUS | Status: AC
  Administered 2023-01-23: 500 [IU]
  Filled 2023-01-23: qty 5

## 2023-01-23 MED ORDER — HEPARIN SOD (PORK) LOCK FLUSH 100 UNIT/ML IV SOLN: 500.0000 [IU] | INTRAVENOUS | Status: AC | PRN

## 2023-01-23 NOTE — Discharge Instructions (Signed)

## 2023-01-23 NOTE — Plan of Care (Signed)
  Problem: Education: Goal: Knowledge of General Education information will improve Description: Including pain rating scale, medication(s)/side effects and non-pharmacologic comfort measures Outcome: Progressing   Problem: Health Behavior/Discharge Planning: Goal: Ability to manage health-related needs will improve Outcome: Progressing   Problem: Clinical Measurements: Goal: Ability to maintain clinical measurements within normal limits will improve Outcome: Progressing Goal: Will remain free from infection Outcome: Progressing Goal: Diagnostic test results will improve Outcome: Progressing Goal: Respiratory complications will improve Outcome: Progressing Goal: Cardiovascular complication will be avoided Outcome: Progressing   Problem: Activity: Goal: Risk for activity intolerance will decrease Outcome: Progressing   Problem: Nutrition: Goal: Adequate nutrition will be maintained Outcome: Progressing   Problem: Coping: Goal: Level of anxiety will decrease Outcome: Progressing   Problem: Pain Management: Goal: General experience of comfort will improve Outcome: Progressing   Problem: Safety: Goal: Ability to remain free from injury will improve Outcome: Progressing   Problem: Skin Integrity: Goal: Risk for impaired skin integrity will decrease Outcome: Progressing   Problem: Activity: Goal: Ability to tolerate increased activity will improve Outcome: Progressing Goal: Will verbalize the importance of balancing activity with adequate rest periods Outcome: Progressing   Problem: Respiratory: Goal: Ability to maintain a clear airway will improve Outcome: Progressing Goal: Levels of oxygenation will improve Outcome: Progressing Goal: Ability to maintain adequate ventilation will improve Outcome: Progressing   Problem: Respiratory: Goal: Ability to maintain adequate ventilation will improve Outcome: Progressing Goal: Ability to maintain a clear airway will  improve Outcome: Progressing

## 2023-01-23 NOTE — Discharge Summary (Signed)
Physician Discharge Summary  Leon Fischer DOB: [birth year]  PCP: Patient, No Pcp Per  Admitted from: Home Discharged to: Home  Admit date: 01/20/2023 Discharge date: 01/23/2023  Recommendations for Outpatient Follow-up:    Follow-up Information     [name], MD. Schedule an appointment as soon as possible for a visit in 1 week(s).   Specialties: Hematology, Oncology Why: To be seen with repeat labs (CBC with differential and BMP). Contact information: [address] [place] [address] [phone]                  Home Health: None    Equipment/Devices: None    Discharge Condition: Improved and stable.   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 CTA of the chest revealed groundglass densities in the right upper lobe and bilateral lower lobes left greater than right.  Admitted for possible pneumonia.     Assessment & Plan:   Multifocal pneumonia in an immunocompromised patient All symptoms resolved. No leukocytosis or fever Influenza A and B, RSV and SARS coronavirus 2 by RT-PCR negative.  HIV screen negative. Procalcitonin negative.  Unfortunately RVP was not sent until now and have requested RN to send this prior to discharging patient and this can be followed up outpatient at the cancer  center. Completed 3 days of IV ceftriaxone and 4 days of azithromycin.  Despite negative procalcitonin, concerned about bacterial pneumonia.  Also concerned about mycoplasma pneumoniae pneumonia, seeing more cases of this.  Therefore after discussing with pharmacy, opting to complete 5 days of azithromycin, transitioned to oral Augmentin to complete total 7 days.  Alternatively all his symptoms and CT chest findings may be inflammatory of unclear etiology.?  Related to cancer chemotherapy. Recommend repeating chest x-ray in 4 weeks to ensure resolution of pneumonia findings.   Asthma exacerbation Reports infrequent exacerbation at home and rarely uses his as needed albuterol inhaler Continue quick prednisone taper and as needed albuterol inhaler at discharge.  It does not appear that the prednisone taper will coincide with his post chemo Decadron.  He states that his next chemo is not due until 1/6.  He missed a cycle of chemo on 12/18 due to acute illness and current hospitalization. Does not follow with pulmonologist   Malignant poorly differentiated neuroendocrine carcinoma Receiving palliative chemotherapy Follows with Dr. Candise Che Chemotherapy for 12/18 reportedly has been postponed to 1/6.  Requested Dr. Candise Che for follow-up of labs i.e. CBC with differential next week (sent message via in basket and secure chat).   Hypokalemia Replaced   Anemia and thrombocytopenia May be related to recent chemotherapy last week Anemia and thrombocytopenia relatively stable.  New leukopenia/mild neutropenia. Follow CBC next week.   Body mass index is 42.18 kg/m./Obesity Outpatient follow-up.     Consultants:   None   Procedures:  None     Discharge Instructions  Discharge Instructions     Call MD for:  difficulty breathing, headache or visual disturbances   Complete by: As directed    Call MD for:  extreme fatigue   Complete by: As directed    Call MD for:  persistant dizziness or  light-headedness   Complete by: As directed    Call MD for:  persistant nausea and vomiting   Complete by: As directed    Call MD for:  severe uncontrolled pain   Complete by: As directed    Call MD for:  temperature >100.4   Complete by: As directed    Diet general   Complete by: As directed    Increase activity slowly   Complete by: As directed         Medication List     STOP taking these medications    lidocaine-prilocaine cream Commonly known as: EMLA   loperamide 2 MG capsule Commonly known as: IMODIUM   oxyCODONE 5 MG immediate release tablet Commonly known as: Oxy IR/ROXICODONE   polyethylene glycol 17 g packet Commonly known as: MIRALAX / GLYCOLAX   senna-docusate 8.6-50 MG tablet Commonly known as: Senokot-S       TAKE these medications    acetaminophen 500 MG tablet Commonly known as: TYLENOL Take 1,000 mg by mouth every 6 (six) hours as needed for mild pain.   albuterol 108 (90 Base) MCG/ACT inhaler Commonly known as: VENTOLIN HFA Inhale 1-2 puffs into the lungs every 6 (six) hours as needed for wheezing or shortness of breath.   amoxicillin-clavulanate 875-125 MG tablet Commonly known as: AUGMENTIN Take 1 tablet by mouth 2 (two) times daily for 4 days.   azithromycin 500 MG tablet Commonly known as: ZITHROMAX Take 1 tablet (500 mg total) by mouth once for 1 dose. Start taking on: January 24, 2023   dexamethasone 4 MG tablet Commonly known as: DECADRON Take 2 tablets (8 mg total) by mouth daily. Start the day after chemotherapy for 2 days. Take with food.   ondansetron 8 MG tablet Commonly known as: ZOFRAN Take 1 tablet (8 mg total) by mouth every 8 (eight) hours as needed for nausea, vomiting or refractory nausea / vomiting. Start on the third day after chemotherapy.   predniSONE 10 MG tablet Commonly known as: DELTASONE Take 3 tabs (30 mg total) daily x 2 days, then 2 tabs (20 mg total) daily x 2 days, then 1 tab (10 mg total) daily x  2 days, then stop. Start taking on: January 24, 2023   prochlorperazine 10 MG tablet Commonly known as: COMPAZINE Take 1 tablet (10 mg total) by mouth every 6 (six) hours as needed for nausea or vomiting.       Allergies  Allergen Reactions   Shellfish Allergy Anaphylaxis, Shortness Of Breath, Swelling and Other (See Comments)    Eyes, throat swell shut, and the face swells      Procedures/Studies: CT Angio Chest PE W and/or Wo Contrast Result Date: 01/20/2023 CLINICAL DATA:  Cough wheezing chest tightness EXAM: CT ANGIOGRAPHY CHEST WITH CONTRAST TECHNIQUE: Multidetector CT imaging of the chest was performed using the standard protocol during bolus administration of intravenous contrast. Multiplanar CT image reconstructions and MIPs were obtained to evaluate the vascular anatomy. RADIATION DOSE REDUCTION: This exam was performed according to the departmental dose-optimization program which includes automated exposure control, adjustment of the mA and/or kV according to patient size and/or use of iterative reconstruction technique. CONTRAST:  90mL OMNIPAQUE IOHEXOL 350 MG/ML SOLN COMPARISON:  CT 01/17/2023 FINDINGS: Cardiovascular: Satisfactory opacification of the pulmonary arteries to the segmental level. No evidence of pulmonary embolism. Nonaneurysmal aorta. No dissection is seen. Right-sided central venous catheter tip at the right atrium. Normal cardiac size. No pericardial effusion Mediastinum/Nodes: Patent trachea. No thyroid mass. No suspicious lymph nodes. Esophagus within normal limits. Lungs/Pleura: Interim development of multifocal ground-glass densities most evident in the right upper lobe and left greater than right lower lobes. No pleural effusion or pneumothorax Upper Abdomen: Pneumobilia. Hypodense liver lesions corresponding to history of metastatic disease. Splenomegaly. Incompletely visualized pancreatic mass and common duct stent. See recently performed CT chest abdomen  pelvis. Musculoskeletal: No acute or suspicious osseous abnormality Review of the MIP images confirms the above findings. IMPRESSION: 1. Negative for acute pulmonary embolus or aortic dissection. 2. Interim development of multifocal ground-glass densities most evident in the right upper lobe and left greater than right lower lobes, suspicious for multifocal pneumonia. 3. Upper abdomen demonstrates pancreatic mass, hepatic metastatic disease, and splenomegaly. See recently performed CT chest abdomen pelvis. Electronically Signed   By: Jasmine Pang M.D.   On: 01/20/2023 15:33   DG Chest Portable 1 View Result Date: 01/20/2023 CLINICAL DATA:  Cough EXAM: PORTABLE CHEST 1 VIEW COMPARISON:  01/17/2023 FINDINGS: Right chest port in place with distal tip terminating the level of the right atrium. The heart size and mediastinal contours are within normal limits. Both lungs are clear. The visualized skeletal structures are unremarkable. IMPRESSION: No active disease. Electronically Signed   By: Duanne Guess D.O.   On: 01/20/2023 12:01   CT CHEST ABDOMEN PELVIS W CONTRAST Result Date: 01/18/2023 CLINICAL DATA:  Metastatic neuroendocrine tumor, worsening LFTs * Tracking Code: BO * EXAM: CT CHEST, ABDOMEN, AND PELVIS WITH CONTRAST TECHNIQUE: Multidetector CT imaging of the chest, abdomen and pelvis was performed following the standard protocol during bolus administration of intravenous contrast. RADIATION DOSE REDUCTION: This exam was performed according to the departmental dose-optimization program which includes automated exposure control, adjustment of the mA and/or kV according to patient size and/or use of iterative reconstruction technique. CONTRAST:  OMNIPAQUE IOHEXOL 300 MG/ML  SOLN COMPARISON:  10/17/2022 FINDINGS: CT CHEST FINDINGS Cardiovascular: Right chest port catheter. Normal heart size. No pericardial effusion. Mediastinum/Nodes: No enlarged mediastinal, hilar, or axillary lymph nodes. Thymic  remnant in the anterior mediastinum. Thyroid gland, trachea, and esophagus demonstrate no significant findings. Lungs/Pleura: Mild diffuse bilateral bronchial wall thickening and scattered bronchiolar plugging throughout the chest. No pleural effusion or pneumothorax. Musculoskeletal: Bilateral gynecomastia. No acute osseous findings. Unchanged small densely sclerotic bone island of the posterior left tenth rib (series 7, image 127). CT ABDOMEN PELVIS FINDINGS Hepatobiliary: Multiple subtle hypodense liver lesions are not significantly changed, index lesion in the central left lobe of the liver, hepatic segment IVA measuring 1.6 x 1.4 cm (series 3, image 48), index lesion in the tip of the left lobe of the liver, hepatic segment II measuring 1.6 x 1.5 cm (series 3, image 44). Status post cholecystectomy. Common bile duct stent remains in position. No persistent biliary ductal dilatation. Pancreas: Unchanged appearance of the pancreatic head with common bile duct stent in place and underlying, treated mass within the superior pancreatic head measuring 5.5 x 3.3 cm (series 3, image 59). Unchanged atrophy of the distal pancreas and dilatation of the duct along its length up to 0.9 cm (series 3, image 50). Spleen: Splenomegaly, maximum span 15.9 cm (series 3, image 67). Adrenals/Urinary Tract: Adrenal  glands are unremarkable. Kidneys are normal, without renal calculi, solid lesion, or hydronephrosis. Bladder is unremarkable. Stomach/Bowel: Stomach is within normal limits. Appendix appears normal. No evidence of bowel wall thickening, distention, or inflammatory changes. Vascular/Lymphatic: Unchanged effacement of the central superior mesenteric vein and portal confluence secondary to mass. Numerous small variceal collaterals throughout the abdomen. Unchanged enlarged retroperitoneal lymph nodes, aortocaval node measuring up to 3.1 x 1.3 cm (series 3, image 65). Reproductive: No mass or other abnormality. Other: No  abdominal wall hernia or abnormality. Unchanged small volume free fluid in the low pelvis. Musculoskeletal: No acute osseous findings. IMPRESSION: 1. Unchanged appearance of the pancreatic head with common bile duct stent in place and underlying, treated mass within the superior pancreatic head. Unchanged atrophy of the distal pancreas and dilatation of the duct. Unchanged effacement of the central superior mesenteric vein and portal confluence secondary to mass. Numerous small variceal collaterals throughout the abdomen. 2. Multiple unchanged liver metastases. 3. Unchanged retroperitoneal nodal metastases. 4. Unchanged small volume free fluid in the low pelvis. No directly visualized peritoneal metastatic disease. 5. No evidence of metastatic disease in the chest. 6. Mild diffuse bilateral bronchial wall thickening and scattered bronchiolar plugging throughout the chest, consistent with nonspecific infectious or inflammatory bronchitis. 7. Splenomegaly. Electronically Signed   By: Jearld Lesch M.D.   On: 01/18/2023 15:09      Subjective: Denies complaints.  Anxious for DC.  No dyspnea at rest or with activity, chest pain, fever or chills.  Good appetite.  Cough much improved, minimal, intermittent and dry.  Discharge Exam:  Vitals:   01/22/23 2117 01/23/23 0532 01/23/23 0809 01/23/23 0811  BP: (!) 155/94 (!) 145/91    Pulse: (!) 106 74    Resp: 20 (!) 22    Temp: 98.7 F (37.1 C) 98.6 F (37 C)    TempSrc: Oral Oral    SpO2: 96% 99% 99% 99%  Weight:      Height:        General exam: Young male, moderately built and obese sitting up in bed.  Looks improved compared to yesterday. Respiratory system: Improved breath sounds.  Clear to auscultation.  No wheezing, rhonchi or crackles.  No increased work of breathing. Cardiovascular system: S1 & S2 heard, RRR. No JVD, murmurs, rubs, gallops or clicks. No pedal edema.   Gastrointestinal system: Abdomen is nondistended, soft and nontender. No  organomegaly or masses felt. Normal bowel sounds heard. Central nervous system: Alert and oriented. No focal neurological deficits. Extremities: Symmetric 5 x 5 power. Skin: No rashes, lesions or ulcers Psychiatry: Judgement and insight appear normal. Mood & affect appropriate.     The results of significant diagnostics from this hospitalization (including imaging, microbiology, ancillary and laboratory) are listed below for reference.     Microbiology: Recent Results (from the past 240 hours)  Resp panel by RT-PCR (RSV, Flu A&B, Covid) Anterior Nasal Swab     Status: None   Collection Time: 01/20/23  3:25 PM   Specimen: Anterior Nasal Swab  Result Value Ref Range Status   SARS Coronavirus 2 by RT PCR NEGATIVE NEGATIVE Final    Comment: (NOTE) SARS-CoV-2 target nucleic acids are NOT DETECTED.  The SARS-CoV-2 RNA is generally detectable in upper respiratory specimens during the acute phase of infection. The lowest concentration of SARS-CoV-2 viral copies this assay can detect is 138 copies/mL. A negative result does not preclude SARS-Cov-2 infection and should not be used as the sole basis for treatment or other patient  management decisions. A negative result may occur with  improper specimen collection/handling, submission of specimen other than nasopharyngeal swab, presence of viral mutation(s) within the areas targeted by this assay, and inadequate number of viral copies(<138 copies/mL). A negative result must be combined with clinical observations, patient history, and epidemiological information. The expected result is Negative.  Fact Sheet for Patients:  BloggerCourse.com  Fact Sheet for Healthcare Providers:  SeriousBroker.it  This test is no t yet approved or cleared by the [place] FDA and  has been authorized for detection and/or diagnosis of SARS-CoV-2 by FDA under an Emergency Use Authorization (EUA). This EUA  will remain  in effect (meaning this test can be used) for the duration of the COVID-19 declaration under Section 564(b)(1) of the Act, 21 U.S.C.section 360bbb-3(b)(1), unless the authorization is terminated  or revoked sooner.       Influenza A by PCR NEGATIVE NEGATIVE Final   Influenza B by PCR NEGATIVE NEGATIVE Final    Comment: (NOTE) The Xpert Xpress SARS-CoV-2/FLU/RSV plus assay is intended as an aid in the diagnosis of influenza from Nasopharyngeal swab specimens and should not be used as a sole basis for treatment. Nasal washings and aspirates are unacceptable for Xpert Xpress SARS-CoV-2/FLU/RSV testing.  Fact Sheet for Patients: BloggerCourse.com  Fact Sheet for Healthcare Providers: SeriousBroker.it  This test is not yet approved or cleared by the [place] FDA and has been authorized for detection and/or diagnosis of SARS-CoV-2 by FDA under an Emergency Use Authorization (EUA). This EUA will remain in effect (meaning this test can be used) for the duration of the COVID-19 declaration under Section 564(b)(1) of the Act, 21 U.S.C. section 360bbb-3(b)(1), unless the authorization is terminated or revoked.     Resp Syncytial Virus by PCR NEGATIVE NEGATIVE Final    Comment: (NOTE) Fact Sheet for Patients: BloggerCourse.com  Fact Sheet for Healthcare Providers: SeriousBroker.it  This test is not yet approved or cleared by the [place] FDA and has been authorized for detection and/or diagnosis of SARS-CoV-2 by FDA under an Emergency Use Authorization (EUA). This EUA will remain in effect (meaning this test can be used) for the duration of the COVID-19 declaration under Section 564(b)(1) of the Act, 21 U.S.C. section 360bbb-3(b)(1), unless the authorization is terminated or revoked.  Performed at [facility], [address] [place], [place] [address]      Labs: CBC: Recent Labs  Lab 01/20/23 1202 01/21/23 0641 01/22/23 0530 01/23/23 0500  WBC 6.9 6.0 4.4 2.7*  NEUTROABS 5.6  --   --  1.6*  HGB 11.4* 10.5* 9.3* 9.7*  HCT 33.5* 32.9* 29.3* 30.6*  MCV 86.3 89.2 89.6 90.5  PLT 153 137* 121* 116*    Basic Metabolic Panel: Recent Labs  Lab 01/20/23 1202 01/21/23 0641  NA 136 139  K 3.4* 3.5  CL 105 108  CO2 22 25  GLUCOSE 111* 98  BUN 10 15  CREATININE 0.81 0.81  CALCIUM 9.1 9.2    Liver Function Tests: Recent Labs  Lab 01/20/23 1202 01/21/23 0641  AST 24 19  ALT 20 19  ALKPHOS 92 79  BILITOT 1.6* 0.9  PROT 7.9 7.1  ALBUMIN 4.3 3.8     Time coordinating discharge: 35 minutes  SIGNED:  Marcellus Scott, MD,  FACP, [facility], [facility], [facility]   [facility] [place]     To contact the attending provider between 7A-7P or the covering provider during after hours 7P-7A, please log into  the web site [email or web address] and access using universal [place] password for that web site. If you do not have the password, please call the hospital operator.

## 2023-01-24 ENCOUNTER — Inpatient Hospital Stay: Payer: Medicaid Other

## 2023-01-30 ENCOUNTER — Encounter: Payer: Self-pay | Admitting: Hematology

## 2023-02-04 ENCOUNTER — Other Ambulatory Visit: Payer: Self-pay

## 2023-02-04 DIAGNOSIS — C7A1 Malignant poorly differentiated neuroendocrine tumors: Secondary | ICD-10-CM

## 2023-02-07 ENCOUNTER — Encounter: Payer: Self-pay | Admitting: Hematology

## 2023-02-10 ENCOUNTER — Inpatient Hospital Stay: Payer: 59

## 2023-02-10 ENCOUNTER — Other Ambulatory Visit: Payer: Self-pay | Admitting: Hematology

## 2023-02-10 ENCOUNTER — Inpatient Hospital Stay: Payer: 59 | Attending: Hematology

## 2023-02-10 ENCOUNTER — Inpatient Hospital Stay: Payer: 59 | Attending: Hematology | Admitting: Hematology

## 2023-02-10 VITALS — BP 142/95 | HR 89 | Temp 97.9°F | Resp 19 | Wt 318.2 lb

## 2023-02-10 DIAGNOSIS — C7A1 Malignant poorly differentiated neuroendocrine tumors: Secondary | ICD-10-CM

## 2023-02-10 DIAGNOSIS — Z7189 Other specified counseling: Secondary | ICD-10-CM

## 2023-02-10 DIAGNOSIS — F17210 Nicotine dependence, cigarettes, uncomplicated: Secondary | ICD-10-CM | POA: Diagnosis not present

## 2023-02-10 DIAGNOSIS — Z5111 Encounter for antineoplastic chemotherapy: Secondary | ICD-10-CM | POA: Diagnosis not present

## 2023-02-10 DIAGNOSIS — C7B8 Other secondary neuroendocrine tumors: Secondary | ICD-10-CM | POA: Diagnosis not present

## 2023-02-10 DIAGNOSIS — Z95828 Presence of other vascular implants and grafts: Secondary | ICD-10-CM

## 2023-02-10 LAB — CBC WITH DIFFERENTIAL (CANCER CENTER ONLY)
Abs Immature Granulocytes: 0.01 10*3/uL (ref 0.00–0.07)
Basophils Absolute: 0 10*3/uL (ref 0.0–0.1)
Basophils Relative: 1 %
Eosinophils Absolute: 0.4 10*3/uL (ref 0.0–0.5)
Eosinophils Relative: 9 %
HCT: 34.9 % — ABNORMAL LOW (ref 39.0–52.0)
Hemoglobin: 11.5 g/dL — ABNORMAL LOW (ref 13.0–17.0)
Immature Granulocytes: 0 %
Lymphocytes Relative: 14 %
Lymphs Abs: 0.6 10*3/uL — ABNORMAL LOW (ref 0.7–4.0)
MCH: 29 pg (ref 26.0–34.0)
MCHC: 33 g/dL (ref 30.0–36.0)
MCV: 87.9 fL (ref 80.0–100.0)
Monocytes Absolute: 0.5 10*3/uL (ref 0.1–1.0)
Monocytes Relative: 11 %
Neutro Abs: 2.9 10*3/uL (ref 1.7–7.7)
Neutrophils Relative %: 65 %
Platelet Count: 144 10*3/uL — ABNORMAL LOW (ref 150–400)
RBC: 3.97 MIL/uL — ABNORMAL LOW (ref 4.22–5.81)
RDW: 16.1 % — ABNORMAL HIGH (ref 11.5–15.5)
WBC Count: 4.4 10*3/uL (ref 4.0–10.5)
nRBC: 0 % (ref 0.0–0.2)

## 2023-02-10 LAB — CMP (CANCER CENTER ONLY)
ALT: 18 U/L (ref 0–44)
AST: 20 U/L (ref 15–41)
Albumin: 4.1 g/dL (ref 3.5–5.0)
Alkaline Phosphatase: 76 U/L (ref 38–126)
Anion gap: 5 (ref 5–15)
BUN: 10 mg/dL (ref 6–20)
CO2: 27 mmol/L (ref 22–32)
Calcium: 9.2 mg/dL (ref 8.9–10.3)
Chloride: 108 mmol/L (ref 98–111)
Creatinine: 0.8 mg/dL (ref 0.61–1.24)
GFR, Estimated: >60 mL/min (ref >60)
Glucose, Bld: 98 mg/dL (ref 70–99)
Potassium: 3.5 mmol/L (ref 3.5–5.1)
Sodium: 140 mmol/L (ref 135–145)
Total Bilirubin: 0.9 mg/dL (ref 0.0–1.2)
Total Protein: 7.2 g/dL (ref 6.5–8.1)

## 2023-02-10 MED ORDER — SODIUM CHLORIDE 0.9 % IV SOLN
Freq: Once | INTRAVENOUS | Status: AC
Start: 2023-02-10 — End: 2023-02-10

## 2023-02-10 MED ORDER — PALONOSETRON HCL INJECTION 0.25 MG/5ML
0.2500 mg | Freq: Once | INTRAVENOUS | Status: AC
  Administered 2023-02-10: 0.25 mg via INTRAVENOUS
  Filled 2023-02-10: qty 5

## 2023-02-10 MED ORDER — SODIUM CHLORIDE 0.9% FLUSH
10.0000 mL | Freq: Once | INTRAVENOUS | Status: AC
  Administered 2023-02-10: 10 mL

## 2023-02-10 MED ORDER — ATROPINE SULFATE 1 MG/ML IV SOLN
0.5000 mg | Freq: Once | INTRAVENOUS | Status: AC
  Administered 2023-02-10: 0.5 mg via INTRAVENOUS
  Filled 2023-02-10: qty 1

## 2023-02-10 MED ORDER — SODIUM CHLORIDE 0.9 % IV SOLN
400.0000 mg/m2 | Freq: Once | INTRAVENOUS | Status: AC
  Administered 2023-02-10: 1020 mg via INTRAVENOUS
  Filled 2023-02-10: qty 50

## 2023-02-10 MED ORDER — HEPARIN SOD (PORK) LOCK FLUSH 100 UNIT/ML IV SOLN: 500.0000 [IU] | Freq: Once | INTRAVENOUS | Status: DC | PRN

## 2023-02-10 MED ORDER — SODIUM CHLORIDE 0.9 % IV SOLN
2400.0000 mg/m2 | INTRAVENOUS | Status: DC
  Administered 2023-02-10: 6100 mg via INTRAVENOUS
  Filled 2023-02-10: qty 122

## 2023-02-10 MED ORDER — DEXAMETHASONE SODIUM PHOSPHATE 10 MG/ML IJ SOLN
10.0000 mg | Freq: Once | INTRAMUSCULAR | Status: AC
Start: 2023-02-10 — End: 2023-02-10
  Administered 2023-02-10: 10 mg via INTRAVENOUS
  Filled 2023-02-10: qty 1

## 2023-02-10 MED ORDER — FLUOROURACIL CHEMO INJECTION 2.5 GM/50ML
400.0000 mg/m2 | Freq: Once | INTRAVENOUS | Status: AC
  Administered 2023-02-10: 1000 mg via INTRAVENOUS
  Filled 2023-02-10: qty 20

## 2023-02-10 MED ORDER — SODIUM CHLORIDE 0.9% FLUSH
10.0000 mL | INTRAVENOUS | Status: DC | PRN
  Administered 2023-02-10: 10 mL

## 2023-02-10 MED ORDER — SODIUM CHLORIDE 0.9 % IV SOLN
180.0000 mg/m2 | Freq: Once | INTRAVENOUS | Status: AC
  Administered 2023-02-10: 460 mg via INTRAVENOUS
  Filled 2023-02-10: qty 13

## 2023-02-10 NOTE — Progress Notes (Signed)
 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 Posthospitalization follow-up after pneumonia  HISTORY OF PRESENTING ILLNESS:  Please see previous note for details on initial presentation  INTERVAL HISTORY:  Leon Fischer. Is a 33 y.o. 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   He is here for follow-up after his recent hospitalization for multifocal pneumonia thought to be possibly related to chlamydia pneumonia. Patient was last seen in clinic on 01/08/2023. He had a CT chest abdomen pelvis on 01/17/2023 which showed no new metastases and stable findings from his metastatic high-grade neuroendocrine carcinoma.  He went to the emergency room for increasing shortness of breath and dry cough on 01/20/2023 and was treated with IV ceftriaxone  and azithromycin  for likely bacterial pneumonia with high suspicion for possible mycoplasma pneumonia.  His CT of the chest on 01/20/2023 ruled out a PE but showed multifocal infiltrates. Patient responded rapidly to antibiotics and was discharged on 01/23/2023. By respiratory viral panel sent prior to discharge was positive for rhinovirus/enterovirus and negative for COVID-19 and influenza. Patient has recovered completely from his pneumonia and at this time has no acute respiratory symptoms and is afebrile. He feels like he is back at his baseline and wants to continue with his chemotherapy. No acute new focal symptoms noted. No evidence of GI bleeding. Labs were discussed in details. No new issues with his port.   MEDICAL HISTORY:  Past Medical History:   Diagnosis Date   Asthma    Neuroendocrine cancer (HCC)     SURGICAL HISTORY: Past Surgical History:  Procedure Laterality Date   BILIARY DILATION  07/13/2021   Procedure: BILIARY DILATION;  Surgeon: [name], MD;  Location: [name];  Service: Gastroenterology;;   BILIARY STENT PLACEMENT N/A 07/13/2021   Procedure: BILIARY STENT PLACEMENT;  Surgeon: [name], MD;  Location: [name];  Service: Gastroenterology;  Laterality: N/A;   BILIARY STENT PLACEMENT N/A 11/05/2021   Procedure: BILIARY STENT PLACEMENT;  Surgeon: [name], MD;  Location: [name];  Service: Gastroenterology;  Laterality: N/A;   BIOPSY  07/13/2021   Procedure: BIOPSY;  Surgeon: [name]., MD;  Location: [name];  Service: Gastroenterology;;   CHOLECYSTECTOMY N/A 11/07/2021   Procedure: LAPAROSCOPIC CHOLECYSTECTOMY;  Surgeon: [name], MD;  Location: [facility];  Service: General;  Laterality: N/A;   ENDOSCOPIC RETROGRADE CHOLANGIOPANCREATOGRAPHY (ERCP) WITH PROPOFOL  N/A 07/13/2021   Procedure: ENDOSCOPIC RETROGRADE CHOLANGIOPANCREATOGRAPHY (ERCP) WITH PROPOFOL ;  Surgeon: [name], MD;  [name];  Service: Gastroenterology;  Laterality: N/A;   ERCP N/A 11/05/2021   Procedure: ENDOSCOPIC RETROGRADE CHOLANGIOPANCREATOGRAPHY (ERCP);  Surgeon: [name], MD;  Location: [name];  Service: Gastroenterology;  Laterality: N/A;   ESOPHAGOGASTRODUODENOSCOPY (EGD) WITH PROPOFOL  N/A 07/13/2021   Procedure: ESOPHAGOGASTRODUODENOSCOPY (EGD) WITH PROPOFOL ;  Surgeon: [name]., MD;  [name];  Service: Gastroenterology;  Laterality: N/A;   EUS N/A 07/13/2021   Procedure: UPPER ENDOSCOPIC ULTRASOUND (EUS) LINEAR;  Surgeon: [name]., MD;  Location: [name];  Service: Gastroenterology;  Laterality: N/A;   FINE NEEDLE ASPIRATION  07/13/2021   Procedure: FINE NEEDLE ASPIRATION (FNA) LINEAR;  Surgeon: [name]., MD;  [facility];  Service: Gastroenterology;;   IR IMAGING GUIDED PORT  INSERTION  07/31/2021   SPHINCTEROTOMY  07/13/2021   Procedure: ANNETT;  Surgeon: [name], MD;  Location: [name];  Service: Gastroenterology;;    SOCIAL HISTORY: Social History   Socioeconomic History   Marital status: Single    Spouse name: Not on file   Number of children: 0   Years of education: Not on file   Highest education level: Some college, no degree  Occupational History   Not on file  Tobacco Use   Smoking status: Some Days    Types: Cigarettes   Smokeless tobacco: Never  Vaping Use   Vaping status: Never Used  Substance and Sexual Activity   Alcohol use: Yes    Comment: social   Drug use: No   Sexual activity: Never  Other Topics Concern   Not on file  Social History Narrative   Not on file   Social Drivers of Health   Financial Resource Strain: Low Risk  (04/14/2017)   Overall Financial Resource Strain (CARDIA)    Difficulty of Paying Living Expenses: Not hard at all  Food Insecurity: No Food Insecurity (01/21/2023)   Hunger Vital Sign    Worried About Running Out of Food in the Last Year: Never true    Ran Out of Food in the Last Year: Never true  Transportation Needs: No Transportation Needs (01/21/2023)   PRAPARE - Administrator, Civil Service (Medical): No    Lack of Transportation (Non-Medical): No  Physical Activity: Inactive (04/14/2017)   Exercise Vital Sign    Days of Exercise per Week: 0 days    Minutes of Exercise per Session: 0 min  Stress: Stress Concern Present (04/14/2017)   [name] Occupational [employer] - Occupational Stress Questionnaire    Feeling of Stress : Rather much  Social Connections: Unknown (06/19/2021)   Received from [facility]   Social Network    Social Network: Not on file  Intimate Partner Violence: Not At Risk (01/21/2023)   Humiliation, Afraid, Rape, and Kick questionnaire    Fear of Current or  Ex-Partner: No    Emotionally Abused: No    Physically Abused: No    Sexually Abused: No    FAMILY HISTORY: Family History  Problem Relation Age of Onset   Drug abuse Father    ALLERGIES:  is allergic to shellfish allergy.  MEDICATIONS:  Current Outpatient Medications  Medication Sig Dispense Refill   acetaminophen  (TYLENOL ) 500 MG tablet Take 1,000 mg by mouth every 6 (six) hours as needed for mild pain.     albuterol  (VENTOLIN  HFA) 108 (90 Base) MCG/ACT inhaler Inhale 1-2 puffs into the lungs every 6 (six) hours as needed for wheezing or shortness of breath. 18 g 0   dexamethasone  (DECADRON ) 4 MG tablet Take 2 tablets (8 mg total) by mouth daily. Start the day after chemotherapy for 2 days. Take with food. 8 tablet 5   ondansetron  (ZOFRAN ) 8 MG tablet Take 1 tablet (8 mg total) by mouth every 8 (eight) hours as needed for nausea, vomiting or refractory nausea / vomiting. Start on the third day after chemotherapy. 30 tablet 1   predniSONE  (DELTASONE ) 10 MG tablet Take 3 tabs (30 mg total) daily x 2 days, then 2 tabs (20 mg total) daily x 2 days, then 1 tab (10 mg total) daily x 2 days, then stop. 12 tablet 0   prochlorperazine  (COMPAZINE ) 10 MG tablet Take 1 tablet (10 mg total) by mouth every 6 (six) hours  as needed for nausea or vomiting. 30 tablet 1   No current facility-administered medications for this visit.    REVIEW OF SYSTEMS:    10 Point review of Systems was done is negative except as noted above.   PHYSICAL EXAMINATION: ECOG PERFORMANCE STATUS: 1 - Symptomatic but completely ambulatory .BP (!) 142/95 (BP Location: Left Arm, Patient Position: Sitting)   Pulse 89   Temp 97.9 F (36.6 C) (Temporal)   Resp 19   Wt (!) 318 lb 3.2 oz (144.3 kg)   SpO2 99%   BMI 43.16 kg/m   GENERAL:alert, in no acute distress and comfortable SKIN: no acute rashes, no significant lesions EYES: conjunctiva are pink and non-injected, sclera anicteric OROPHARYNX: MMM, no exudates, no  oropharyngeal erythema or ulceration NECK: supple, no JVD LYMPH:  no palpable lymphadenopathy in the cervical, axillary or inguinal regions LUNGS: clear to auscultation b/l with normal respiratory effort HEART: regular rate & rhythm ABDOMEN:  normoactive bowel sounds , non tender, not distended. Extremity: no pedal edema PSYCH: alert & oriented x 3 with fluent speech NEURO: no focal motor/sensory deficits     LABORATORY DATA:  I have reviewed the data as listed    Latest Ref Rng & Units 02/10/2023    8:21 AM 01/23/2023    5:00 AM 01/22/2023    5:30 AM  CBC  WBC 4.0 - 10.5 K/uL 4.4  2.7  4.4   Hemoglobin 13.0 - 17.0 g/dL 88.4  9.7  9.3   Hematocrit 39.0 - 52.0 % 34.9  30.6  29.3   Platelets 150 - 400 K/uL 144  116  121       Latest Ref Rng & Units 02/10/2023    8:21 AM 01/21/2023    6:41 AM 01/20/2023   12:02 PM  CMP  Glucose 70 - 99 mg/dL 98  98  888   BUN 6 - 20 mg/dL 10  15  10    Creatinine 0.61 - 1.24 mg/dL 9.19  9.18  9.18   Sodium 135 - 145 mmol/L 140  139  136   Potassium 3.5 - 5.1 mmol/L 3.5  3.5  3.4   Chloride 98 - 111 mmol/L 108  108  105   CO2 22 - 32 mmol/L 27  25  22    Calcium  8.9 - 10.3 mg/dL 9.2  9.2  9.1   Total Protein 6.5 - 8.1 g/dL 7.2  7.1  7.9   Total Bilirubin 0.0 - 1.2 mg/dL 0.9  0.9  1.6   Alkaline Phos 38 - 126 U/L 76  79  92   AST 15 - 41 U/L 20  19  24    ALT 0 - 44 U/L 18  19  20     Cytology done 07/13/2021 revealed FINAL MICROSCOPIC DIAGNOSIS:  A. PANCREAS, HEAD, FINE NEEDLE ASPIRATION:  - Malignant cells present  - Poorly differentiated/high-grade neuroendocrine carcinoma (see  comment)   B. CBD STRICTURE, BRUSHING:  - Atypical cells suspicious for tumor   C. BILIARY DILATION, BALLOON, REMOVAL:  - Atypical cells suspicious for tumor   Surgical pathology done 07/13/2021 revealed FINAL MICROSCOPIC DIAGNOSIS:   A. STOMACH, BIOPSY:  Reactive gastropathy and minimal chronic gastritis with lymphoid  aggregate  Negative for H.  pylori, intestinal metaplasia, dysplasia and carcinoma   Pathology 11/07/21: FINAL MICROSCOPIC DIAGNOSIS:   A. GALLBLADDER, CHOLECYSTECTOMY:  - Acute cholecystitis with necrosis and abscess.   RADIOGRAPHIC STUDIES: I have personally reviewed the radiological images as listed and agreed with the findings  in the report. CT Angio Chest PE W and/or Wo Contrast Result Date: 01/20/2023 CLINICAL DATA:  Cough wheezing chest tightness EXAM: CT ANGIOGRAPHY CHEST WITH CONTRAST TECHNIQUE: Multidetector CT imaging of the chest was performed using the standard protocol during bolus administration of intravenous contrast. Multiplanar CT image reconstructions and MIPs were obtained to evaluate the vascular anatomy. RADIATION DOSE REDUCTION: This exam was performed according to the departmental dose-optimization program which includes automated exposure control, adjustment of the mA and/or kV according to patient size and/or use of iterative reconstruction technique. CONTRAST:  90mL OMNIPAQUE  IOHEXOL  350 MG/ML SOLN COMPARISON:  CT 01/17/2023 FINDINGS: Cardiovascular: Satisfactory opacification of the pulmonary arteries to the segmental level. No evidence of pulmonary embolism. Nonaneurysmal aorta. No dissection is seen. Right-sided central venous catheter tip at the right atrium. Normal cardiac size. No pericardial effusion Mediastinum/Nodes: Patent trachea. No thyroid mass. No suspicious lymph nodes. Esophagus within normal limits. Lungs/Pleura: Interim development of multifocal ground-glass densities most evident in the right upper lobe and left greater than right lower lobes. No pleural effusion or pneumothorax Upper Abdomen: Pneumobilia. Hypodense liver lesions corresponding to history of metastatic disease. Splenomegaly. Incompletely visualized pancreatic mass and common duct stent. See recently performed CT chest abdomen pelvis. Musculoskeletal: No acute or suspicious osseous abnormality Review of the MIP images  confirms the above findings. IMPRESSION: 1. Negative for acute pulmonary embolus or aortic dissection. 2. Interim development of multifocal ground-glass densities most evident in the right upper lobe and left greater than right lower lobes, suspicious for multifocal pneumonia. 3. Upper abdomen demonstrates pancreatic mass, hepatic metastatic disease, and splenomegaly. See recently performed CT chest abdomen pelvis. Electronically Signed   By: Luke Bun M.D.   On: 01/20/2023 15:33   DG Chest Portable 1 View Result Date: 01/20/2023 CLINICAL DATA:  Cough EXAM: PORTABLE CHEST 1 VIEW COMPARISON:  01/17/2023 FINDINGS: Right chest port in place with distal tip terminating the level of the right atrium. The heart size and mediastinal contours are within normal limits. Both lungs are clear. The visualized skeletal structures are unremarkable. IMPRESSION: No active disease. Electronically Signed   By: Mabel Converse D.O.   On: 01/20/2023 12:01   CT CHEST ABDOMEN PELVIS W CONTRAST Result Date: 01/18/2023 CLINICAL DATA:  Metastatic neuroendocrine tumor, worsening LFTs * Tracking Code: BO * EXAM: CT CHEST, ABDOMEN, AND PELVIS WITH CONTRAST TECHNIQUE: Multidetector CT imaging of the chest, abdomen and pelvis was performed following the standard protocol during bolus administration of intravenous contrast. RADIATION DOSE REDUCTION: This exam was performed according to the departmental dose-optimization program which includes automated exposure control, adjustment of the mA and/or kV according to patient size and/or use of iterative reconstruction technique. CONTRAST:  OMNIPAQUE  IOHEXOL  300 MG/ML  SOLN COMPARISON:  10/17/2022 FINDINGS: CT CHEST FINDINGS Cardiovascular: Right chest port catheter. Normal heart size. No pericardial effusion. Mediastinum/Nodes: No enlarged mediastinal, hilar, or axillary lymph nodes. Thymic remnant in the anterior mediastinum. Thyroid gland, trachea, and esophagus demonstrate no  significant findings. Lungs/Pleura: Mild diffuse bilateral bronchial wall thickening and scattered bronchiolar plugging throughout the chest. No pleural effusion or pneumothorax. Musculoskeletal: Bilateral gynecomastia. No acute osseous findings. Unchanged small densely sclerotic bone island of the posterior left tenth rib (series 7, image 127). CT ABDOMEN PELVIS FINDINGS Hepatobiliary: Multiple subtle hypodense liver lesions are not significantly changed, index lesion in the central left lobe of the liver, hepatic segment IVA measuring 1.6 x 1.4 cm (series 3, image 48), index lesion in the tip of the left lobe of the  liver, hepatic segment II measuring 1.6 x 1.5 cm (series 3, image 44). Status post cholecystectomy. Common bile duct stent remains in position. No persistent biliary ductal dilatation. Pancreas: Unchanged appearance of the pancreatic head with common bile duct stent in place and underlying, treated mass within the superior pancreatic head measuring 5.5 x 3.3 cm (series 3, image 59). Unchanged atrophy of the distal pancreas and dilatation of the duct along its length up to 0.9 cm (series 3, image 50). Spleen: Splenomegaly, maximum span 15.9 cm (series 3, image 67). Adrenals/Urinary Tract: Adrenal glands are unremarkable. Kidneys are normal, without renal calculi, solid lesion, or hydronephrosis. Bladder is unremarkable. Stomach/Bowel: Stomach is within normal limits. Appendix appears normal. No evidence of bowel wall thickening, distention, or inflammatory changes. Vascular/Lymphatic: Unchanged effacement of the central superior mesenteric vein and portal confluence secondary to mass. Numerous small variceal collaterals throughout the abdomen. Unchanged enlarged retroperitoneal lymph nodes, aortocaval node measuring up to 3.1 x 1.3 cm (series 3, image 65). Reproductive: No mass or other abnormality. Other: No abdominal wall hernia or abnormality. Unchanged small volume free fluid in the low pelvis.  Musculoskeletal: No acute osseous findings. IMPRESSION: 1. Unchanged appearance of the pancreatic head with common bile duct stent in place and underlying, treated mass within the superior pancreatic head. Unchanged atrophy of the distal pancreas and dilatation of the duct. Unchanged effacement of the central superior mesenteric vein and portal confluence secondary to mass. Numerous small variceal collaterals throughout the abdomen. 2. Multiple unchanged liver metastases. 3. Unchanged retroperitoneal nodal metastases. 4. Unchanged small volume free fluid in the low pelvis. No directly visualized peritoneal metastatic disease. 5. No evidence of metastatic disease in the chest. 6. Mild diffuse bilateral bronchial wall thickening and scattered bronchiolar plugging throughout the chest, consistent with nonspecific infectious or inflammatory bronchitis. 7. Splenomegaly. Electronically Signed   By: Marolyn JONETTA Jaksch M.D.   On: 01/18/2023 15:09     ASSESSMENT & PLAN:   33 y.o. very pleasant male with  1. 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 -Cytology done 07/13/2021 shows presence of malignant cells in pancreas and poorly differentiated/high grade neuroendocrine carcinoma.  2.  Recent hospitalization from pneumonia likely related to mycoplasma.  Respiratory viral panel showed rhinovirus/enterovirus.  3.  Hyperpigmentation from 5-FU chemotherapy  4.  Financial and work-related stressors. PLAN:   -Patient's recent hospitalization and treatment and improvement course was reviewed in detail. -he is now back to his baseline after being treated for what appears to be a bacterial pneumonia and currently not having respiratory symptoms fevers or signs of active infection. -Labs today show stable CBC and CMP. -He is motivated to continue his FOLFIRI chemotherapy and will proceed with the next cycle today.  His last cycle  was held due to his hospitalization with pneumonia. -He was recommended to stay up to speed with his vaccinations and continue infection precautions. -Patient requests and was given a letter to inform his employer regarding his ongoing treatment with chemotherapy. -Patient requests and is motivated to work part-time and feels okay to do this and does note that he will take appropriate infection precautions.  He will be given a letter to say that he can return back to work in the weeks that he is not on chemotherapy with adequate precautions as discussed.      FOLLOW-UP: Please schedule next 4 cycles of FOLFIRI chemotherapy with port flush and labs and MD visit.  .The total  time spent in the appointment was 30 minutes* .  All of the patient's questions were answered with apparent satisfaction. The patient knows to call the clinic with any problems, questions or concerns.   Emaline Saran MD MS AAHIVMS [facility] Hematology/Oncology Physician [facility]  .*Total Encounter Time as defined by the Centers for Medicare and Medicaid Services includes, in addition to the face-to-face time of a patient visit (documented in the note above) non-face-to-face time: obtaining and reviewing outside history, ordering and reviewing medications, tests or procedures, care coordination (communications with other health care professionals or caregivers) and documentation in the medical record.

## 2023-02-10 NOTE — Progress Notes (Signed)
 Patient seen by Dr. Addison Naegeli are within treatment parameters.  Labs reviewed: and are within treatment parameters.  Per physician team, patient is ready for treatment and there are NO modifications to the treatment plan.

## 2023-02-10 NOTE — Patient Instructions (Signed)
 [facility] - A DEPT OF [facility]  Discharge Instructions: Thank you for choosing [facility] to provide your oncology and hematology care.   If you have a lab appointment with the Cancer Center, please go directly to the Cancer Center and check in at the registration area.   Wear comfortable clothing and clothing appropriate for easy access to any Portacath or PICC line.   We strive to give you quality time with your provider. You may need to reschedule your appointment if you arrive late (15 or more minutes).  Arriving late affects you and other patients whose appointments are after yours.  Also, if you miss three or more appointments without notifying the office, you may be dismissed from the clinic at the provider's discretion.      For prescription refill requests, have your pharmacy contact our office and allow 72 hours for refills to be completed.    Today you received the following chemotherapy and/or immunotherapy agents: Irinotecan, Leucovorin, 5FU      To help prevent nausea and vomiting after your treatment, we encourage you to take your nausea medication as directed.  BELOW ARE SYMPTOMS THAT SHOULD BE REPORTED IMMEDIATELY: *FEVER GREATER THAN 100.4 F (38 C) OR HIGHER *CHILLS OR SWEATING *NAUSEA AND VOMITING THAT IS NOT CONTROLLED WITH YOUR NAUSEA MEDICATION *UNUSUAL SHORTNESS OF BREATH *UNUSUAL BRUISING OR BLEEDING *URINARY PROBLEMS (pain or burning when urinating, or frequent urination) *BOWEL PROBLEMS (unusual diarrhea, constipation, pain near the anus) TENDERNESS IN MOUTH AND THROAT WITH OR WITHOUT PRESENCE OF ULCERS (sore throat, sores in mouth, or a toothache) UNUSUAL RASH, SWELLING OR PAIN  UNUSUAL VAGINAL DISCHARGE OR ITCHING   Items with * indicate a potential emergency and should be followed up as soon as possible or go to the Emergency Department if any problems should occur.  Please show the CHEMOTHERAPY ALERT CARD  or IMMUNOTHERAPY ALERT CARD at check-in to the Emergency Department and triage nurse.  Should you have questions after your visit or need to cancel or reschedule your appointment, please contact [facility] - A DEPT OF [facility]  Dept: [phone]  and follow the prompts.  Office hours are 8:00 a.m. to 4:30 p.m. Monday - Friday. Please note that voicemails left after 4:00 p.m. may not be returned until the following business day.  We are closed weekends and major holidays. You have access to a nurse at all times for urgent questions. Please call the main number to the clinic Dept: [phone] and follow the prompts.   For any non-urgent questions, you may also contact your provider using MyChart. We now offer e-Visits for anyone 50 and older to request care online for non-urgent symptoms. For details visit [email or web address].   Also download the MyChart app! Go to the app store, search "MyChart", open the app, select [facility], and log in with your MyChart username and password.

## 2023-02-11 ENCOUNTER — Encounter: Payer: Self-pay | Admitting: Hematology

## 2023-02-12 ENCOUNTER — Other Ambulatory Visit: Payer: Self-pay

## 2023-02-12 ENCOUNTER — Inpatient Hospital Stay: Payer: 59

## 2023-02-12 VITALS — BP 120/77 | HR 89 | Temp 98.7°F | Resp 20

## 2023-02-12 DIAGNOSIS — C7A1 Malignant poorly differentiated neuroendocrine tumors: Secondary | ICD-10-CM

## 2023-02-12 DIAGNOSIS — Z5111 Encounter for antineoplastic chemotherapy: Secondary | ICD-10-CM | POA: Diagnosis not present

## 2023-02-12 DIAGNOSIS — Z7189 Other specified counseling: Secondary | ICD-10-CM

## 2023-02-12 MED ORDER — SODIUM CHLORIDE 0.9% FLUSH
10.0000 mL | INTRAVENOUS | Status: DC | PRN
  Administered 2023-02-12: 10 mL

## 2023-02-12 MED ORDER — HEPARIN SOD (PORK) LOCK FLUSH 100 UNIT/ML IV SOLN
500.0000 [IU] | Freq: Once | INTRAVENOUS | Status: AC | PRN
  Administered 2023-02-12: 500 [IU]

## 2023-02-12 NOTE — Progress Notes (Signed)
 Pt. Here for Pump D/C.  Noted pump volume read zero, but pt. Had 15-30 cc still left in the bag.  Huntley Dec H/RN and Alycia Rossetti H/RN came over to access the issue.  States to discontinue the pump as usual and to put this in safety zone.

## 2023-02-19 ENCOUNTER — Encounter: Payer: Self-pay | Admitting: Hematology

## 2023-02-20 ENCOUNTER — Encounter: Payer: Self-pay | Admitting: Hematology

## 2023-02-21 ENCOUNTER — Other Ambulatory Visit: Payer: Self-pay

## 2023-02-21 DIAGNOSIS — C7A1 Malignant poorly differentiated neuroendocrine tumors: Secondary | ICD-10-CM

## 2023-02-24 ENCOUNTER — Inpatient Hospital Stay (HOSPITAL_BASED_OUTPATIENT_CLINIC_OR_DEPARTMENT_OTHER): Payer: 59 | Admitting: Hematology

## 2023-02-24 ENCOUNTER — Inpatient Hospital Stay: Payer: 59

## 2023-02-24 ENCOUNTER — Encounter: Payer: Self-pay | Admitting: Hematology

## 2023-02-24 VITALS — BP 130/79 | HR 99 | Temp 98.2°F | Resp 17 | Wt 312.0 lb

## 2023-02-24 DIAGNOSIS — C7A1 Malignant poorly differentiated neuroendocrine tumors: Secondary | ICD-10-CM

## 2023-02-24 DIAGNOSIS — Z7189 Other specified counseling: Secondary | ICD-10-CM

## 2023-02-24 DIAGNOSIS — Z5111 Encounter for antineoplastic chemotherapy: Secondary | ICD-10-CM | POA: Diagnosis not present

## 2023-02-24 DIAGNOSIS — Z95828 Presence of other vascular implants and grafts: Secondary | ICD-10-CM

## 2023-02-24 LAB — CBC WITH DIFFERENTIAL (CANCER CENTER ONLY)
Abs Immature Granulocytes: 0 10*3/uL (ref 0.00–0.07)
Basophils Absolute: 0 10*3/uL (ref 0.0–0.1)
Basophils Relative: 0 %
Eosinophils Absolute: 0.3 10*3/uL (ref 0.0–0.5)
Eosinophils Relative: 9 %
HCT: 36.6 % — ABNORMAL LOW (ref 39.0–52.0)
Hemoglobin: 12.1 g/dL — ABNORMAL LOW (ref 13.0–17.0)
Immature Granulocytes: 0 %
Lymphocytes Relative: 21 %
Lymphs Abs: 0.7 10*3/uL (ref 0.7–4.0)
MCH: 28.5 pg (ref 26.0–34.0)
MCHC: 33.1 g/dL (ref 30.0–36.0)
MCV: 86.1 fL (ref 80.0–100.0)
Monocytes Absolute: 0.3 10*3/uL (ref 0.1–1.0)
Monocytes Relative: 9 %
Neutro Abs: 2 10*3/uL (ref 1.7–7.7)
Neutrophils Relative %: 61 %
Platelet Count: 152 10*3/uL (ref 150–400)
RBC: 4.25 MIL/uL (ref 4.22–5.81)
RDW: 15 % (ref 11.5–15.5)
WBC Count: 3.2 10*3/uL — ABNORMAL LOW (ref 4.0–10.5)
nRBC: 0 % (ref 0.0–0.2)

## 2023-02-24 LAB — CMP (CANCER CENTER ONLY)
ALT: 14 U/L (ref 0–44)
AST: 19 U/L (ref 15–41)
Albumin: 4.1 g/dL (ref 3.5–5.0)
Alkaline Phosphatase: 89 U/L (ref 38–126)
Anion gap: 6 (ref 5–15)
BUN: 11 mg/dL (ref 6–20)
CO2: 27 mmol/L (ref 22–32)
Calcium: 9.4 mg/dL (ref 8.9–10.3)
Chloride: 105 mmol/L (ref 98–111)
Creatinine: 0.84 mg/dL (ref 0.61–1.24)
GFR, Estimated: >60 mL/min (ref >60)
Glucose, Bld: 156 mg/dL — ABNORMAL HIGH (ref 70–99)
Potassium: 3.5 mmol/L (ref 3.5–5.1)
Sodium: 138 mmol/L (ref 135–145)
Total Bilirubin: 0.9 mg/dL (ref 0.0–1.2)
Total Protein: 7.5 g/dL (ref 6.5–8.1)

## 2023-02-24 MED ORDER — SODIUM CHLORIDE 0.9 % IV SOLN: Freq: Once | INTRAVENOUS | Status: AC

## 2023-02-24 MED ORDER — SODIUM CHLORIDE 0.9% FLUSH
10.0000 mL | Freq: Once | INTRAVENOUS | Status: AC
Start: 2023-02-24 — End: 2023-02-24
  Administered 2023-02-24: 10 mL

## 2023-02-24 MED ORDER — IRINOTECAN HCL CHEMO INJECTION 100 MG/5ML
180.0000 mg/m2 | Freq: Once | INTRAVENOUS | Status: AC
  Administered 2023-02-24: 460 mg via INTRAVENOUS
  Filled 2023-02-24: qty 15

## 2023-02-24 MED ORDER — FLUOROURACIL CHEMO INJECTION 5 GM/100ML
2400.0000 mg/m2 | INTRAVENOUS | Status: DC
  Administered 2023-02-24: 6100 mg via INTRAVENOUS
  Filled 2023-02-24: qty 122

## 2023-02-24 MED ORDER — FLUOROURACIL CHEMO INJECTION 2.5 GM/50ML
400.0000 mg/m2 | Freq: Once | INTRAVENOUS | Status: AC
Start: 2023-02-24 — End: 2023-02-24
  Administered 2023-02-24: 1000 mg via INTRAVENOUS
  Filled 2023-02-24: qty 20

## 2023-02-24 MED ORDER — LEUCOVORIN CALCIUM INJECTION 350 MG
400.0000 mg/m2 | Freq: Once | INTRAMUSCULAR | Status: AC
  Administered 2023-02-24: 1020 mg via INTRAVENOUS
  Filled 2023-02-24: qty 51

## 2023-02-24 MED ORDER — ATROPINE SULFATE 1 MG/ML IV SOLN
0.5000 mg | Freq: Once | INTRAVENOUS | Status: DC | PRN
Start: 2023-02-24 — End: 2023-02-24
  Filled 2023-02-24: qty 1

## 2023-02-24 MED ORDER — PALONOSETRON HCL INJECTION 0.25 MG/5ML
0.2500 mg | Freq: Once | INTRAVENOUS | Status: AC
  Administered 2023-02-24: 0.25 mg via INTRAVENOUS
  Filled 2023-02-24: qty 5

## 2023-02-24 MED ORDER — SODIUM CHLORIDE 0.9% FLUSH
10.0000 mL | INTRAVENOUS | Status: DC | PRN
Start: 2023-02-24 — End: 2023-02-24
  Administered 2023-02-24: 10 mL

## 2023-02-24 MED ORDER — DEXAMETHASONE SODIUM PHOSPHATE 10 MG/ML IJ SOLN
10.0000 mg | Freq: Once | INTRAMUSCULAR | Status: AC
  Administered 2023-02-24: 10 mg via INTRAVENOUS
  Filled 2023-02-24: qty 1

## 2023-02-24 NOTE — Progress Notes (Signed)
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 Posthospitalization follow-up after pneumonia  HISTORY OF PRESENTING ILLNESS:  Please see previous note for details on initial presentation  INTERVAL HISTORY:  Leon Fischer. Is a 33 y.o. 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 He is here for cycle 1 day 14 of his Folfiri treatment.   Patient was last seen by me on 02/10/2023 and he was doing well overall. He recovered well from his Pneumonia.   Patient is accompanied by his mother during this visit. Patient notes he has been doing well overall since our last visit. He denies any new infection issues, fever, chills, night sweat ,unexpected weight loss, back pain, chest pain, abdominal pain, or leg swelling.   He tolerated his treatment well without any new or severe toxicities.   Patient notes he received a call from [facility], but he was not able to answer and will call them back to set up the appointment.   Patient notes he will call [facility] for appointment.   MEDICAL HISTORY:  Past Medical History:  Diagnosis Date   Asthma    Neuroendocrine cancer (HCC)     SURGICAL HISTORY: Past Surgical History:  Procedure Laterality Date   BILIARY DILATION  07/13/2021   Procedure: BILIARY DILATION;  Surgeon: Vida Rigger, MD;  Location: [facility];  Service: Gastroenterology;;   BILIARY STENT PLACEMENT N/A 07/13/2021   Procedure: BILIARY STENT PLACEMENT;  Surgeon: Vida Rigger, MD;  Location: [name];  Service: Gastroenterology;  Laterality: N/A;   BILIARY STENT PLACEMENT N/A 11/05/2021   Procedure:  BILIARY STENT PLACEMENT;  Surgeon: Vida Rigger, MD;  Location: [name];  Service: Gastroenterology;  Laterality: N/A;   BIOPSY  07/13/2021   Procedure: BIOPSY;  Surgeon: Meridee Score Netty Starring., MD;  Location: [facility];  Service: Gastroenterology;;   CHOLECYSTECTOMY N/A 11/07/2021   Procedure: LAPAROSCOPIC CHOLECYSTECTOMY;  Surgeon: Berna Bue, MD;  Location: [facility];  Service: General;  Laterality: N/A;   ENDOSCOPIC RETROGRADE CHOLANGIOPANCREATOGRAPHY (ERCP) WITH PROPOFOL N/A 07/13/2021   Procedure: ENDOSCOPIC RETROGRADE CHOLANGIOPANCREATOGRAPHY (ERCP) WITH PROPOFOL;  Surgeon: Vida Rigger, MD;  [name];  Service: Gastroenterology;  Laterality: N/A;   ERCP N/A 11/05/2021   Procedure: ENDOSCOPIC RETROGRADE CHOLANGIOPANCREATOGRAPHY (ERCP);  Surgeon: Vida Rigger, MD;  Location: [facility];  Service: Gastroenterology;  Laterality: N/A;   ESOPHAGOGASTRODUODENOSCOPY (EGD) WITH PROPOFOL N/A 07/13/2021   Procedure: ESOPHAGOGASTRODUODENOSCOPY (EGD) WITH PROPOFOL;  Surgeon: Meridee Score Netty Starring., MD;  [name];  Service: Gastroenterology;  Laterality: N/A;   EUS N/A 07/13/2021   Procedure: UPPER ENDOSCOPIC ULTRASOUND (EUS) LINEAR;  Surgeon: Lemar Lofty., MD;  Location[name];  Service: Gastroenterology;  Laterality: N/A;   FINE NEEDLE ASPIRATION  07/13/2021   Procedure: FINE NEEDLE ASPIRATION (FNA) LINEAR;  Surgeon: Lemar Lofty., MD;  Location: [facility];  Service: Gastroenterology;;   IR IMAGING GUIDED PORT INSERTION  07/31/2021   SPHINCTEROTOMY  07/13/2021   Procedure: Dennison Mascot;  Surgeon: Vida Rigger, MD;  [name];  Service: Gastroenterology;;    SOCIAL HISTORY: Social History   Socioeconomic History   Marital status: Single    Spouse name: Not on file   Number of children: 0   Years of education: Not on  file   Highest education level: Some college, no degree  Occupational History   Not on file  Tobacco Use    Smoking status: Some Days    Types: Cigarettes   Smokeless tobacco: Never  Vaping Use   Vaping status: Never Used  Substance and Sexual Activity   Alcohol use: Yes    Comment: social   Drug use: No   Sexual activity: Never  Other Topics Concern   Not on file  Social History Narrative   Not on file   Social Drivers of Health   Financial Resource Strain: Low Risk  (04/14/2017)   Overall Financial Resource Strain (CARDIA)    Difficulty of Paying Living Expenses: Not hard at all  Food Insecurity: No Food Insecurity (01/21/2023)   Hunger Vital Sign    Worried About Running Out of Food in the Last Year: Never true    Ran Out of Food in the Last Year: Never true  Transportation Needs: No Transportation Needs (01/21/2023)   PRAPARE - Administrator, Civil Service (Medical): No    Lack of Transportation (Non-Medical): No  Physical Activity: Inactive (04/14/2017)   Exercise Vital Sign    Days of Exercise per Week: 0 days    Minutes of Exercise per Session: 0 min  Stress: Stress Concern Present (04/14/2017)   [name] of Occupational Health - Occupational Stress Questionnaire    Feeling of Stress : Rather much  Social Connections: Unknown (06/19/2021)   Received from [facility]   Social Network    Social Network: Not on file  Intimate Partner Violence: Not At Risk (01/21/2023)   Humiliation, Afraid, Rape, and Kick questionnaire    Fear of Current or Ex-Partner: No    Emotionally Abused: No    Physically Abused: No    Sexually Abused: No    FAMILY HISTORY: Family History  Problem Relation Age of Onset   Drug abuse Father    ALLERGIES:  is allergic to shellfish allergy.  MEDICATIONS:  Current Outpatient Medications  Medication Sig Dispense Refill   acetaminophen (TYLENOL) 500 MG tablet Take 1,000 mg by mouth every 6 (six) hours as needed for mild pain.     albuterol (VENTOLIN HFA) 108 (90 Base) MCG/ACT inhaler Inhale 1-2 puffs into the  lungs every 6 (six) hours as needed for wheezing or shortness of breath. 18 g 0   dexamethasone (DECADRON) 4 MG tablet Take 2 tablets (8 mg total) by mouth daily. Start the day after chemotherapy for 2 days. Take with food. 8 tablet 5   ondansetron (ZOFRAN) 8 MG tablet Take 1 tablet (8 mg total) by mouth every 8 (eight) hours as needed for nausea, vomiting or refractory nausea / vomiting. Start on the third day after chemotherapy. 30 tablet 1   predniSONE (DELTASONE) 10 MG tablet Take 3 tabs (30 mg total) daily x 2 days, then 2 tabs (20 mg total) daily x 2 days, then 1 tab (10 mg total) daily x 2 days, then stop. 12 tablet 0   prochlorperazine (COMPAZINE) 10 MG tablet Take 1 tablet (10 mg total) by mouth every 6 (six) hours as needed for nausea or vomiting. 30 tablet 1   No current facility-administered medications for this visit.    REVIEW OF SYSTEMS:    10 Point review of Systems was done is negative except as noted above.   PHYSICAL EXAMINATION: ECOG PERFORMANCE STATUS: 1 - Symptomatic but completely ambulatory .BP 130/79 (BP Location: Left Arm, Patient  Position: Sitting)   Pulse 99   Temp 98.2 F (36.8 C) (Oral)   Resp 17   Wt (!) 312 lb (141.5 kg)   SpO2 100%   BMI 42.31 kg/m   GENERAL:alert, in no acute distress and comfortable SKIN: no acute rashes, no significant lesions EYES: conjunctiva are pink and non-injected, sclera anicteric OROPHARYNX: MMM, no exudates, no oropharyngeal erythema or ulceration NECK: supple, no JVD LYMPH:  no palpable lymphadenopathy in the cervical, axillary or inguinal regions LUNGS: clear to auscultation b/l with normal respiratory effort HEART: regular rate & rhythm ABDOMEN:  normoactive bowel sounds , non tender, not distended. Extremity: no pedal edema PSYCH: alert & oriented x 3 with fluent speech NEURO: no focal motor/sensory deficits  LABORATORY DATA:  I have reviewed the data as listed    Latest Ref Rng & Units 02/24/2023    9:01 AM  02/10/2023    8:21 AM 01/23/2023    5:00 AM  CBC  WBC 4.0 - 10.5 K/uL 3.2  4.4  2.7   Hemoglobin 13.0 - 17.0 g/dL 82.9  56.2  9.7   Hematocrit 39.0 - 52.0 % 36.6  34.9  30.6   Platelets 150 - 400 K/uL 152  144  116   .    Latest Ref Rng & Units 02/24/2023    9:01 AM 02/10/2023    8:21 AM 01/21/2023    6:41 AM  CMP  Glucose 70 - 99 mg/dL 130  98  98   BUN 6 - 20 mg/dL 11  10  15    Creatinine 0.61 - 1.24 mg/dL 8.65  7.84  6.96   Sodium 135 - 145 mmol/L 138  140  139   Potassium 3.5 - 5.1 mmol/L 3.5  3.5  3.5   Chloride 98 - 111 mmol/L 105  108  108   CO2 22 - 32 mmol/L 27  27  25    Calcium 8.9 - 10.3 mg/dL 9.4  9.2  9.2   Total Protein 6.5 - 8.1 g/dL 7.5  7.2  7.1   Total Bilirubin 0.0 - 1.2 mg/dL 0.9  0.9  0.9   Alkaline Phos 38 - 126 U/L 89  76  79   AST 15 - 41 U/L 19  20  19    ALT 0 - 44 U/L 14  18  19      Cytology done 07/13/2021 revealed "FINAL MICROSCOPIC DIAGNOSIS:  A. PANCREAS, HEAD, FINE NEEDLE ASPIRATION:  - Malignant cells present  - Poorly differentiated/high-grade neuroendocrine carcinoma (see  comment)   B. CBD STRICTURE, BRUSHING:  - Atypical cells suspicious for tumor   C. BILIARY DILATION, BALLOON, REMOVAL:  - Atypical cells suspicious for tumor "  Surgical pathology done 07/13/2021 revealed "FINAL MICROSCOPIC DIAGNOSIS:   A. STOMACH, BIOPSY:  Reactive gastropathy and minimal chronic gastritis with lymphoid  aggregate  Negative for H. pylori, intestinal metaplasia, dysplasia and carcinoma"   Pathology 11/07/21: FINAL MICROSCOPIC DIAGNOSIS:   A. GALLBLADDER, CHOLECYSTECTOMY:  - Acute cholecystitis with necrosis and abscess.   RADIOGRAPHIC STUDIES: I have personally reviewed the radiological images as listed and agreed with the findings in the report. No results found.    ASSESSMENT & PLAN:   33 y.o. very pleasant male with  1. Poorly differentiated/high grade neuroendocrine carcinoma - likely Stage IV metastatic to loco regional LNs and  likely liver mets. -Cytology done 07/13/2021 shows presence of malignant cells in pancreas and poorly differentiated/high grade neuroendocrine carcinoma.  2.  Recent hospitalization from pneumonia likely  related to mycoplasma.  Respiratory viral panel showed rhinovirus/enterovirus.  3.  Hyperpigmentation from 5-FU chemotherapy  4.  Financial and work-related stressors.  PLAN: -Discussed lab results from today, 02/24/2023, in detail with the patient. CBC shows low WBC of 3.2 K, slightly low hemoglobin of 12/1 g/dL with hematocrit of 16.1%. CMP shows elevated blood glucose level of 156, but stable overall. -Patient has been tolerating his treatment well without any new or severe toxicities.  -Patient can proceed with cycle 14 day 1 of Folfiri treatment during this visit. -Answered all of patient's questions.  - Patient notes he received a call from [facility], but he was not able to answer and will call them back to set up the appointment.   FOLLOW-UP: Please schedule next 4 cycles of FOLFIRI chemotherapy with port flush and labs and MD visit.  The total time spent in the appointment was 25 minutes* .  All of the patient's questions were answered with apparent satisfaction. The patient knows to call the clinic with any problems, questions or concerns.   Wyvonnia Lora MD MS AAHIVMS [facility] Hematology/Oncology Physician [facility]  .*Total Encounter Time as defined by the Centers for Medicare and Medicaid Services includes, in addition to the face-to-face time of a patient visit (documented in the note above) non-face-to-face time: obtaining and reviewing outside history, ordering and reviewing medications, tests or procedures, care coordination (communications with other health care professionals or caregivers) and documentation in the medical record.   I,Param Shah,acting as a Neurosurgeon for Wyvonnia Lora, MD.,have documented all relevant documentation on the behalf of Wyvonnia Lora, MD,as directed by  Wyvonnia Lora, MD while in the presence of Wyvonnia Lora, MD.  .I have reviewed the above documentation for accuracy and completeness, and I agree with the above. [name] MD

## 2023-02-24 NOTE — Progress Notes (Signed)
Patient seen by Dr. Kale  Vitals are within treatment parameters.  Labs reviewed: and are within treatment parameters.  Per physician team, patient is ready for treatment and there are NO modifications to the treatment plan.  

## 2023-02-24 NOTE — Patient Instructions (Signed)
 [facility]  Discharge Instructions: Thank you for choosing [facility] to provide your oncology and hematology care.   If you have a lab appointment with the Cancer Center, please go directly to the Cancer Center and check in at the registration area.   Wear comfortable clothing and clothing appropriate for easy access to any Portacath or PICC line.   We strive to give you quality time with your provider. You may need to reschedule your appointment if you arrive late (15 or more minutes).  Arriving late affects you and other patients whose appointments are after yours.  Also, if you miss three or more appointments without notifying the office, you may be dismissed from the clinic at the provider's discretion.      For prescription refill requests, have your pharmacy contact our office and allow 72 hours for refills to be completed.    Today you received the following chemotherapy and/or immunotherapy agents: Irinotecan, Leucovorin, 5FU      To help prevent nausea and vomiting after your treatment, we encourage you to take your nausea medication as directed.  BELOW ARE SYMPTOMS THAT SHOULD BE REPORTED IMMEDIATELY: *FEVER GREATER THAN 100.4 F (38 C) OR HIGHER *CHILLS OR SWEATING *NAUSEA AND VOMITING THAT IS NOT CONTROLLED WITH YOUR NAUSEA MEDICATION *UNUSUAL SHORTNESS OF BREATH *UNUSUAL BRUISING OR BLEEDING *URINARY PROBLEMS (pain or burning when urinating, or frequent urination) *BOWEL PROBLEMS (unusual diarrhea, constipation, pain near the anus) TENDERNESS IN MOUTH AND THROAT WITH OR WITHOUT PRESENCE OF ULCERS (sore throat, sores in mouth, or a toothache) UNUSUAL RASH, SWELLING OR PAIN  UNUSUAL VAGINAL DISCHARGE OR ITCHING   Items with * indicate a potential emergency and should be followed up as soon as possible or go to the Emergency Department if any problems should occur.  Please show the CHEMOTHERAPY ALERT CARD  or IMMUNOTHERAPY ALERT CARD at check-in to the Emergency Department and triage nurse.  Should you have questions after your visit or need to cancel or reschedule your appointment, please contact [facility] - A DEPT OF [facility]  Dept: [phone]  and follow the prompts.  Office hours are 8:00 a.m. to 4:30 p.m. Monday - Friday. Please note that voicemails left after 4:00 p.m. may not be returned until the following business day.  We are closed weekends and major holidays. You have access to a nurse at all times for urgent questions. Please call the main number to the clinic Dept: [phone] and follow the prompts.   For any non-urgent questions, you may also contact your provider using MyChart. We now offer e-Visits for anyone 61 and older to request care online for non-urgent symptoms. For details visit [email or web address].   Also download the MyChart app! Go to the app store, search "MyChart", open the app, select , and log in with your MyChart username and password.

## 2023-02-26 ENCOUNTER — Inpatient Hospital Stay: Payer: 59

## 2023-02-26 VITALS — BP 135/84 | HR 88 | Temp 98.4°F | Resp 20

## 2023-02-26 DIAGNOSIS — Z5111 Encounter for antineoplastic chemotherapy: Secondary | ICD-10-CM | POA: Diagnosis not present

## 2023-02-26 DIAGNOSIS — C7A1 Malignant poorly differentiated neuroendocrine tumors: Secondary | ICD-10-CM

## 2023-02-26 DIAGNOSIS — Z7189 Other specified counseling: Secondary | ICD-10-CM

## 2023-02-26 MED ORDER — SODIUM CHLORIDE 0.9% FLUSH
10.0000 mL | INTRAVENOUS | Status: DC | PRN
  Administered 2023-02-26: 10 mL

## 2023-02-26 MED ORDER — HEPARIN SOD (PORK) LOCK FLUSH 100 UNIT/ML IV SOLN
500.0000 [IU] | Freq: Once | INTRAVENOUS | Status: AC | PRN
  Administered 2023-02-26: 500 [IU]

## 2023-02-27 ENCOUNTER — Other Ambulatory Visit: Payer: Self-pay

## 2023-02-28 ENCOUNTER — Encounter: Payer: Self-pay | Admitting: Hematology

## 2023-03-11 ENCOUNTER — Inpatient Hospital Stay (HOSPITAL_BASED_OUTPATIENT_CLINIC_OR_DEPARTMENT_OTHER): Payer: 59 | Admitting: Hematology

## 2023-03-11 ENCOUNTER — Inpatient Hospital Stay: Payer: 59

## 2023-03-11 ENCOUNTER — Inpatient Hospital Stay: Payer: 59 | Attending: Hematology

## 2023-03-11 VITALS — BP 119/84 | HR 87 | Temp 97.9°F | Resp 19 | Wt 309.5 lb

## 2023-03-11 DIAGNOSIS — Z7189 Other specified counseling: Secondary | ICD-10-CM | POA: Diagnosis not present

## 2023-03-11 DIAGNOSIS — Z95828 Presence of other vascular implants and grafts: Secondary | ICD-10-CM

## 2023-03-11 DIAGNOSIS — F17210 Nicotine dependence, cigarettes, uncomplicated: Secondary | ICD-10-CM | POA: Diagnosis not present

## 2023-03-11 DIAGNOSIS — Z5111 Encounter for antineoplastic chemotherapy: Secondary | ICD-10-CM | POA: Diagnosis not present

## 2023-03-11 DIAGNOSIS — C7A1 Malignant poorly differentiated neuroendocrine tumors: Secondary | ICD-10-CM

## 2023-03-11 LAB — CMP (CANCER CENTER ONLY)
ALT: 12 U/L (ref 0–44)
AST: 17 U/L (ref 15–41)
Albumin: 4.1 g/dL (ref 3.5–5.0)
Alkaline Phosphatase: 82 U/L (ref 38–126)
Anion gap: 4 — ABNORMAL LOW (ref 5–15)
BUN: 11 mg/dL (ref 6–20)
CO2: 28 mmol/L (ref 22–32)
Calcium: 9.3 mg/dL (ref 8.9–10.3)
Chloride: 107 mmol/L (ref 98–111)
Creatinine: 0.88 mg/dL (ref 0.61–1.24)
GFR, Estimated: >60 mL/min (ref >60)
Glucose, Bld: 100 mg/dL — ABNORMAL HIGH (ref 70–99)
Potassium: 3.8 mmol/L (ref 3.5–5.1)
Sodium: 139 mmol/L (ref 135–145)
Total Bilirubin: 0.7 mg/dL (ref 0.0–1.2)
Total Protein: 7.2 g/dL (ref 6.5–8.1)

## 2023-03-11 LAB — CBC WITH DIFFERENTIAL (CANCER CENTER ONLY)
Abs Immature Granulocytes: 0.01 10*3/uL (ref 0.00–0.07)
Basophils Absolute: 0 10*3/uL (ref 0.0–0.1)
Basophils Relative: 0 %
Eosinophils Absolute: 0.2 10*3/uL (ref 0.0–0.5)
Eosinophils Relative: 6 %
HCT: 35.9 % — ABNORMAL LOW (ref 39.0–52.0)
Hemoglobin: 11.8 g/dL — ABNORMAL LOW (ref 13.0–17.0)
Immature Granulocytes: 0 %
Lymphocytes Relative: 21 %
Lymphs Abs: 0.6 10*3/uL — ABNORMAL LOW (ref 0.7–4.0)
MCH: 28.3 pg (ref 26.0–34.0)
MCHC: 32.9 g/dL (ref 30.0–36.0)
MCV: 86.1 fL (ref 80.0–100.0)
Monocytes Absolute: 0.4 10*3/uL (ref 0.1–1.0)
Monocytes Relative: 13 %
Neutro Abs: 1.9 10*3/uL (ref 1.7–7.7)
Neutrophils Relative %: 60 %
Platelet Count: 141 10*3/uL — ABNORMAL LOW (ref 150–400)
RBC: 4.17 MIL/uL — ABNORMAL LOW (ref 4.22–5.81)
RDW: 14.7 % (ref 11.5–15.5)
WBC Count: 3.1 10*3/uL — ABNORMAL LOW (ref 4.0–10.5)
nRBC: 0 % (ref 0.0–0.2)

## 2023-03-11 MED ORDER — SODIUM CHLORIDE 0.9 % IV SOLN
180.0000 mg/m2 | Freq: Once | INTRAVENOUS | Status: AC
  Administered 2023-03-11: 460 mg via INTRAVENOUS
  Filled 2023-03-11: qty 15

## 2023-03-11 MED ORDER — LEUCOVORIN CALCIUM INJECTION 350 MG
400.0000 mg/m2 | Freq: Once | INTRAMUSCULAR | Status: AC
  Administered 2023-03-11: 1020 mg via INTRAVENOUS
  Filled 2023-03-11: qty 50

## 2023-03-11 MED ORDER — SODIUM CHLORIDE 0.9% FLUSH
10.0000 mL | Freq: Once | INTRAVENOUS | Status: AC
Start: 2023-03-11 — End: 2023-03-11
  Administered 2023-03-11: 10 mL

## 2023-03-11 MED ORDER — ATROPINE SULFATE 1 MG/ML IV SOLN
0.5000 mg | Freq: Once | INTRAVENOUS | Status: AC | PRN
  Administered 2023-03-11: 0.5 mg via INTRAVENOUS
  Filled 2023-03-11: qty 1

## 2023-03-11 MED ORDER — SODIUM CHLORIDE 0.9 % IV SOLN: Freq: Once | INTRAVENOUS | Status: AC

## 2023-03-11 MED ORDER — SODIUM CHLORIDE 0.9 % IV SOLN
2400.0000 mg/m2 | INTRAVENOUS | Status: DC
  Administered 2023-03-11: 6100 mg via INTRAVENOUS
  Filled 2023-03-11: qty 122

## 2023-03-11 MED ORDER — DEXAMETHASONE SODIUM PHOSPHATE 10 MG/ML IJ SOLN
10.0000 mg | Freq: Once | INTRAMUSCULAR | Status: AC
  Administered 2023-03-11: 10 mg via INTRAVENOUS
  Filled 2023-03-11: qty 1

## 2023-03-11 MED ORDER — FLUOROURACIL CHEMO INJECTION 2.5 GM/50ML
400.0000 mg/m2 | Freq: Once | INTRAVENOUS | Status: AC
  Administered 2023-03-11: 1000 mg via INTRAVENOUS
  Filled 2023-03-11: qty 20

## 2023-03-11 MED ORDER — PALONOSETRON HCL INJECTION 0.25 MG/5ML
0.2500 mg | Freq: Once | INTRAVENOUS | Status: AC
  Administered 2023-03-11: 0.25 mg via INTRAVENOUS
  Filled 2023-03-11: qty 5

## 2023-03-11 NOTE — Progress Notes (Signed)
 Patient seen by Dr. Onesimo Moccasin are within treatment parameters.  Labs reviewed: and are not all within treatment parameters.    Dr Onesimo aware PLTs: 141,   Per physician team, patient is ready for treatment and there are NO modifications to the treatment plan.

## 2023-03-11 NOTE — Patient Instructions (Signed)
 [facility]  Discharge Instructions: Thank you for choosing [facility] to provide your oncology and hematology care.   If you have a lab appointment with the Cancer Center, please go directly to the Cancer Center and check in at the registration area.   Wear comfortable clothing and clothing appropriate for easy access to any Portacath or PICC line.   We strive to give you quality time with your provider. You may need to reschedule your appointment if you arrive late (15 or more minutes).  Arriving late affects you and other patients whose appointments are after yours.  Also, if you miss three or more appointments without notifying the office, you may be dismissed from the clinic at the provider's discretion.      For prescription refill requests, have your pharmacy contact our office and allow 72 hours for refills to be completed.    Today you received the following chemotherapy and/or immunotherapy agents fluorourcil, leucovorin, irinotecan      To help prevent nausea and vomiting after your treatment, we encourage you to take your nausea medication as directed.  BELOW ARE SYMPTOMS THAT SHOULD BE REPORTED IMMEDIATELY: *FEVER GREATER THAN 100.4 F (38 C) OR HIGHER *CHILLS OR SWEATING *NAUSEA AND VOMITING THAT IS NOT CONTROLLED WITH YOUR NAUSEA MEDICATION *UNUSUAL SHORTNESS OF BREATH *UNUSUAL BRUISING OR BLEEDING *URINARY PROBLEMS (pain or burning when urinating, or frequent urination) *BOWEL PROBLEMS (unusual diarrhea, constipation, pain near the anus) TENDERNESS IN MOUTH AND THROAT WITH OR WITHOUT PRESENCE OF ULCERS (sore throat, sores in mouth, or a toothache) UNUSUAL RASH, SWELLING OR PAIN  UNUSUAL VAGINAL DISCHARGE OR ITCHING   Items with * indicate a potential emergency and should be followed up as soon as possible or go to the Emergency Department if any problems should occur.  Please show the CHEMOTHERAPY  ALERT CARD or IMMUNOTHERAPY ALERT CARD at check-in to the Emergency Department and triage nurse.  Should you have questions after your visit or need to cancel or reschedule your appointment, please contact [facility]  Dept: [phone]  and follow the prompts.  Office hours are 8:00 a.m. to 4:30 p.m. Monday - Friday. Please note that voicemails left after 4:00 p.m. may not be returned until the following business day.  We are closed weekends and major holidays. You have access to a nurse at all times for urgent questions. Please call the main number to the clinic Dept: [phone] and follow the prompts.   For any non-urgent questions, you may also contact your provider using MyChart. We now offer e-Visits for anyone 31 and older to request care online for non-urgent symptoms. For details visit [email or web address].   Also download the MyChart app! Go to the app store, search "MyChart", open the app, select [facility], and log in with your MyChart username and password.

## 2023-03-11 NOTE — Progress Notes (Signed)
 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 Posthospitalization follow-up after pneumonia  HISTORY OF PRESENTING ILLNESS:  Please see previous note for details on initial presentation  INTERVAL HISTORY:  Leon Fischer. Is a 33 y.o. 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 He is here for cycle 15 day 1 of his Folfiri treatment.   Patient was last seen by me on 02/24/2023 and he was doing well overall.   Patient is accompanied by his mom during this visit. Patient notes he has been doing well overall without any new or severe medical concerns since our last visit. He denies any new infection issues, fever, chills, night sweats, unexpected weight loss, back pain, chest pain, abdominal pain, or leg swelling.  He currently wants to hold getting referred to [facility].   Patient has been tolerating his Folfiri treatment well without any new or severe toxicities.    MEDICAL HISTORY:  Past Medical History:  Diagnosis Date   Asthma    Neuroendocrine cancer (HCC)     SURGICAL HISTORY: Past Surgical History:  Procedure Laterality Date   BILIARY DILATION  07/13/2021   Procedure: BILIARY DILATION;  Surgeon: [name], MD;  Location: [name];  Service: Gastroenterology;;   BILIARY STENT PLACEMENT N/A 07/13/2021   Procedure: BILIARY STENT PLACEMENT;  Surgeon: [name], MD;  Location: [name];  Service: Gastroenterology;  Laterality: N/A;   BILIARY STENT PLACEMENT N/A 11/05/2021   Procedure: BILIARY STENT PLACEMENT;  Surgeon: [name], MD;  Location: [name];  Service:  Gastroenterology;  Laterality: N/A;   BIOPSY  07/13/2021   Procedure: BIOPSY;  Surgeon: [name]., MD;  Location: [name];  Service: Gastroenterology;;   CHOLECYSTECTOMY N/A 11/07/2021   Procedure: LAPAROSCOPIC CHOLECYSTECTOMY;  Surgeon: [name], MD;  Location: [facility];  Service: General;  Laterality: N/A;   ENDOSCOPIC RETROGRADE CHOLANGIOPANCREATOGRAPHY (ERCP) WITH PROPOFOL  N/A 07/13/2021   Procedure: ENDOSCOPIC RETROGRADE CHOLANGIOPANCREATOGRAPHY (ERCP) WITH PROPOFOL ;  Surgeon: [name], MD;  Location: [name];  Service: Gastroenterology;  Laterality: N/A;   ERCP N/A 11/05/2021   Procedure: ENDOSCOPIC RETROGRADE CHOLANGIOPANCREATOGRAPHY (ERCP);  Surgeon: [name], MD;  Location: [name];  Service: Gastroenterology;  Laterality: N/A;   ESOPHAGOGASTRODUODENOSCOPY (EGD) WITH PROPOFOL  N/A 07/13/2021   Procedure: ESOPHAGOGASTRODUODENOSCOPY (EGD) WITH PROPOFOL ;  Surgeon: [name]., MD;  Location: [name];  Service: Gastroenterology;  Laterality: N/A;   EUS N/A 07/13/2021   Procedure: UPPER ENDOSCOPIC ULTRASOUND (EUS) LINEAR;  Surgeon: [name]., MD;  Location: [name];  Service: Gastroenterology;  Laterality: N/A;   FINE NEEDLE ASPIRATION  07/13/2021   Procedure: FINE NEEDLE ASPIRATION (FNA) LINEAR;  Surgeon: [name]., MD;  Location: [name];  Service: Gastroenterology;;   IR IMAGING GUIDED PORT INSERTION  07/31/2021   SPHINCTEROTOMY  07/13/2021   Procedure: ANNETT;  Surgeon: [name], MD;  Location: [name];  Service: Gastroenterology;;    SOCIAL HISTORY: Social History   Socioeconomic History   Marital status: Single    Spouse name: Not on file   Number of children: 0   Years of education: Not on file   Highest education level: Some college, no degree  Occupational History   Not on file  Tobacco Use  Smoking status: Some Days    Types: Cigarettes   Smokeless tobacco: Never  Vaping  Use   Vaping status: Never Used  Substance and Sexual Activity   Alcohol use: Yes    Comment: social   Drug use: No   Sexual activity: Never  Other Topics Concern   Not on file  Social History Narrative   Not on file   Social Drivers of Health   Financial Resource Strain: Low Risk  (04/14/2017)   Overall Financial Resource Strain (CARDIA)    Difficulty of Paying Living Expenses: Not hard at all  Food Insecurity: No Food Insecurity (01/21/2023)   Hunger Vital Sign    Worried About Running Out of Food in the Last Year: Never true    Ran Out of Food in the Last Year: Never true  Transportation Needs: No Transportation Needs (01/21/2023)   PRAPARE - Administrator, Civil Service (Medical): No    Lack of Transportation (Non-Medical): No  Physical Activity: Inactive (04/14/2017)   Exercise Vital Sign    Days of Exercise per Week: 0 days    Minutes of Exercise per Session: 0 min  Stress: Stress Concern Present (04/14/2017)   [name] of Occupational [employer] - Occupational Stress Questionnaire    Feeling of Stress : Rather much  Social Connections: Unknown (06/19/2021)   Received from [facility]   Social Network    Social Network: Not on file  Intimate Partner Violence: Not At Risk (01/21/2023)   Humiliation, Afraid, Rape, and Kick questionnaire    Fear of Current or Ex-Partner: No    Emotionally Abused: No    Physically Abused: No    Sexually Abused: No    FAMILY HISTORY: Family History  Problem Relation Age of Onset   Drug abuse Father    ALLERGIES:  is allergic to shellfish allergy.  MEDICATIONS:  Current Outpatient Medications  Medication Sig Dispense Refill   acetaminophen  (TYLENOL ) 500 MG tablet Take 1,000 mg by mouth every 6 (six) hours as needed for mild pain.     albuterol  (VENTOLIN  HFA) 108 (90 Base) MCG/ACT inhaler Inhale 1-2 puffs into the lungs every 6 (six) hours as needed for wheezing or shortness of breath. 18 g 0    dexamethasone  (DECADRON ) 4 MG tablet Take 2 tablets (8 mg total) by mouth daily. Start the day after chemotherapy for 2 days. Take with food. 8 tablet 5   ondansetron  (ZOFRAN ) 8 MG tablet Take 1 tablet (8 mg total) by mouth every 8 (eight) hours as needed for nausea, vomiting or refractory nausea / vomiting. Start on the third day after chemotherapy. 30 tablet 1   predniSONE  (DELTASONE ) 10 MG tablet Take 3 tabs (30 mg total) daily x 2 days, then 2 tabs (20 mg total) daily x 2 days, then 1 tab (10 mg total) daily x 2 days, then stop. 12 tablet 0   prochlorperazine  (COMPAZINE ) 10 MG tablet Take 1 tablet (10 mg total) by mouth every 6 (six) hours as needed for nausea or vomiting. 30 tablet 1   No current facility-administered medications for this visit.    REVIEW OF SYSTEMS:    10 Point review of Systems was done is negative except as noted above.   PHYSICAL EXAMINATION: ECOG PERFORMANCE STATUS: 1 - Symptomatic but completely ambulatory .BP 119/84 (BP Location: Left Arm, Patient Position: Sitting)   Pulse 87   Temp 97.9 F (36.6 C) (Temporal)   Resp 19   Wt (!) 309  lb 8 oz (140.4 kg)   SpO2 98%   BMI 41.98 kg/m   GENERAL:alert, in no acute distress and comfortable SKIN: no acute rashes, no significant lesions EYES: conjunctiva are pink and non-injected, sclera anicteric OROPHARYNX: MMM, no exudates, no oropharyngeal erythema or ulceration NECK: supple, no JVD LYMPH:  no palpable lymphadenopathy in the cervical, axillary or inguinal regions LUNGS: clear to auscultation b/l with normal respiratory effort HEART: regular rate & rhythm ABDOMEN:  normoactive bowel sounds , non tender, not distended. Extremity: no pedal edema PSYCH: alert & oriented x 3 with fluent speech NEURO: no focal motor/sensory deficits  LABORATORY DATA:  I have reviewed the data as listed    Latest Ref Rng & Units 03/11/2023   10:43 AM 02/24/2023    9:01 AM 02/10/2023    8:21 AM  CBC  WBC 4.0 - 10.5 K/uL 3.1   3.2  4.4   Hemoglobin 13.0 - 17.0 g/dL 88.1  87.8  88.4   Hematocrit 39.0 - 52.0 % 35.9  36.6  34.9   Platelets 150 - 400 K/uL 141  152  144   .    Latest Ref Rng & Units 03/11/2023   10:43 AM 02/24/2023    9:01 AM 02/10/2023    8:21 AM  CMP  Glucose 70 - 99 mg/dL 899  843  98   BUN 6 - 20 mg/dL 11  11  10    Creatinine 0.61 - 1.24 mg/dL 9.11  9.15  9.19   Sodium 135 - 145 mmol/L 139  138  140   Potassium 3.5 - 5.1 mmol/L 3.8  3.5  3.5   Chloride 98 - 111 mmol/L 107  105  108   CO2 22 - 32 mmol/L 28  27  27    Calcium  8.9 - 10.3 mg/dL 9.3  9.4  9.2   Total Protein 6.5 - 8.1 g/dL 7.2  7.5  7.2   Total Bilirubin 0.0 - 1.2 mg/dL 0.7  0.9  0.9   Alkaline Phos 38 - 126 U/L 82  89  76   AST 15 - 41 U/L 17  19  20    ALT 0 - 44 U/L 12  14  18      Cytology done 07/13/2021 revealed FINAL MICROSCOPIC DIAGNOSIS:  A. PANCREAS, HEAD, FINE NEEDLE ASPIRATION:  - Malignant cells present  - Poorly differentiated/high-grade neuroendocrine carcinoma (see  comment)   B. CBD STRICTURE, BRUSHING:  - Atypical cells suspicious for tumor   C. BILIARY DILATION, BALLOON, REMOVAL:  - Atypical cells suspicious for tumor   Surgical pathology done 07/13/2021 revealed FINAL MICROSCOPIC DIAGNOSIS:   A. STOMACH, BIOPSY:  Reactive gastropathy and minimal chronic gastritis with lymphoid  aggregate  Negative for H. pylori, intestinal metaplasia, dysplasia and carcinoma   Pathology 11/07/21: FINAL MICROSCOPIC DIAGNOSIS:   A. GALLBLADDER, CHOLECYSTECTOMY:  - Acute cholecystitis with necrosis and abscess.   RADIOGRAPHIC STUDIES: I have personally reviewed the radiological images as listed and agreed with the findings in the report. No results found.    ASSESSMENT & PLAN:   33 y.o. very pleasant male with  1. 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 -Cytology done 07/13/2021 shows presence of malignant cells in  pancreas and poorly differentiated/high grade neuroendocrine carcinoma.  2.  Recent hospitalization from pneumonia likely related to mycoplasma.  Respiratory viral panel showed rhinovirus/enterovirus.  3.  Hyperpigmentation from 5-FU chemotherapy  4.  Financial and work-related stressors.  PLAN: -Discussed lab results from today, 03/31/2023, in detail with the patient. CBC shows low WBC of 3.1 K, patient is slightly anemic with hgb of 11.8 g/dL with hct of 64.0%, and low platelets of 141 K. CMP stable.   -Patient has been tolerating his treatment well without any new or severe toxicities.  -Patient can proceed with cycle 15 day 1 of Folfiri treatment during this visit. -Answered all of patient's questions.  - Patient notes he received a call from [facility], but he was not able to answer and will call them back to set up the appointment.   FOLLOW-UP: Per integrative scheduling  The total time spent in the appointment was 21 minutes* .  All of the patient's questions were answered with apparent satisfaction. The patient knows to call the clinic with any problems, questions or concerns.   Emaline Saran MD MS AAHIVMS [facility] Hematology/Oncology Physician [facility]  .*Total Encounter Time as defined by the Centers for Medicare and Medicaid Services includes, in addition to the face-to-face time of a patient visit (documented in the note above) non-face-to-face time: obtaining and reviewing outside history, ordering and reviewing medications, tests or procedures, care coordination (communications with other health care professionals or caregivers) and documentation in the medical record.   I,Param Shah,acting as a neurosurgeon for Emaline Saran, MD.,have documented all relevant documentation on the behalf of Emaline Saran, MD,as directed by  Emaline Saran, MD while in the presence of Emaline Saran, MD.  .I have reviewed the above documentation for accuracy and completeness, and I agree  with the above. .Graycen Sadlon Kishore Homer Pfeifer MD

## 2023-03-13 ENCOUNTER — Inpatient Hospital Stay: Payer: 59

## 2023-03-13 VITALS — BP 120/83 | HR 86 | Temp 98.7°F | Resp 20

## 2023-03-13 DIAGNOSIS — Z5111 Encounter for antineoplastic chemotherapy: Secondary | ICD-10-CM | POA: Diagnosis not present

## 2023-03-13 DIAGNOSIS — Z95828 Presence of other vascular implants and grafts: Secondary | ICD-10-CM

## 2023-03-13 MED ORDER — SODIUM CHLORIDE 0.9% FLUSH
10.0000 mL | Freq: Once | INTRAVENOUS | Status: AC
  Administered 2023-03-13: 10 mL

## 2023-03-13 MED ORDER — HEPARIN SOD (PORK) LOCK FLUSH 100 UNIT/ML IV SOLN
500.0000 [IU] | Freq: Once | INTRAVENOUS | Status: AC
  Administered 2023-03-13: 500 [IU]

## 2023-03-17 ENCOUNTER — Encounter: Payer: Self-pay | Admitting: Hematology

## 2023-03-18 ENCOUNTER — Encounter: Payer: Self-pay | Admitting: Hematology

## 2023-03-22 ENCOUNTER — Emergency Department (HOSPITAL_COMMUNITY)
Admission: EM | Admit: 2023-03-22 | Discharge: 2023-03-22 | Disposition: A | Discharge status: Home / Self Care | Payer: 59 | Attending: Emergency Medicine | Admitting: Emergency Medicine

## 2023-03-22 ENCOUNTER — Encounter (HOSPITAL_COMMUNITY): Payer: Self-pay | Admitting: *Deleted

## 2023-03-22 ENCOUNTER — Other Ambulatory Visit: Payer: Self-pay

## 2023-03-22 DIAGNOSIS — M549 Dorsalgia, unspecified: Secondary | ICD-10-CM | POA: Insufficient documentation

## 2023-03-22 DIAGNOSIS — Z79899 Other long term (current) drug therapy: Secondary | ICD-10-CM | POA: Diagnosis not present

## 2023-03-22 MED ORDER — METHOCARBAMOL 500 MG PO TABS
500.0000 mg | ORAL_TABLET | Freq: Once | ORAL | Status: AC
  Administered 2023-03-22: 500 mg via ORAL
  Filled 2023-03-22: qty 1

## 2023-03-22 MED ORDER — LIDOCAINE 5 % EX PTCH: 1.0000 | MEDICATED_PATCH | CUTANEOUS | 0 refills | Status: DC

## 2023-03-22 MED ORDER — LIDOCAINE 5 % EX PTCH
1.0000 | MEDICATED_PATCH | CUTANEOUS | Status: DC
  Administered 2023-03-22: 1 via TRANSDERMAL
  Filled 2023-03-22: qty 1

## 2023-03-22 MED ORDER — KETOROLAC TROMETHAMINE 30 MG/ML IJ SOLN
30.0000 mg | Freq: Once | INTRAMUSCULAR | Status: AC
  Administered 2023-03-22: 30 mg via INTRAMUSCULAR
  Filled 2023-03-22: qty 1

## 2023-03-22 MED ORDER — METHOCARBAMOL 500 MG PO TABS
500.0000 mg | ORAL_TABLET | Freq: Two times a day (BID) | ORAL | 0 refills | Status: AC
Start: 2023-03-22 — End: ?

## 2023-03-22 NOTE — ED Provider Notes (Signed)
 [place] EMERGENCY DEPARTMENT AT [facility] Provider Note   CSN: [id number] Arrival date & time: 03/22/23  1216     History  Chief Complaint  Patient presents with   right scapular pain    Leon Fischer. is a 33 y.o. male.  HPI   33 year old male presents emergency department with complaints of right-sided back pain.  Patient reports pain just below his right shoulder blade.  States that symptoms again 2 days ago when he woke from sleep.  States that it feels like a muscular "pull".  States that he had a history of similar symptoms around a year ago and was seen emergency department sent home with a muscle relaxer and did note improvement.  Has been trying Tylenol, TENS unit at home which has helped.  States that he has been having some difficulties getting his TENS unit to work at home but seems to be the most beneficial when he is working.  Denies any weakness/sensory deficits in lower extremities, saddle anesthesia, bowel/bladder dysfunction, fever, history of IV drug use.  Denies any urinary symptoms, abdominal pain.  Denies any chest pain, shortness of breath, exertional worsening of symptoms.  Denies any worsening of symptoms with consumption of food/liquids.  States that the pain is solely worsened with movement.  Past medical history significant for pancreatic neuro endocrine metastatic tumor, cholecystitis with cholecystectomy, gastritis, duodenitis  Home Medications Prior to Admission medications   Medication Sig Start Date End Date Taking? Authorizing Provider  lidocaine (LIDODERM) 5 % Place 1 patch onto the skin daily. Remove & Discard patch within 12 hours or as directed by MD 03/22/23  Yes Sherian Maroon A, PA  methocarbamol (ROBAXIN) 500 MG tablet Take 1 tablet (500 mg total) by mouth 2 (two) times daily. 03/22/23  Yes Sherian Maroon A, PA  acetaminophen (TYLENOL) 500 MG tablet Take 1,000 mg by mouth every 6 (six) hours as needed for mild pain.     [provider]  albuterol (VENTOLIN HFA) 108 (90 Base) MCG/ACT inhaler Inhale 1-2 puffs into the lungs every 6 (six) hours as needed for wheezing or shortness of breath. 01/23/23   Hongalgi, Maximino Greenland, MD  dexamethasone (DECADRON) 4 MG tablet Take 2 tablets (8 mg total) by mouth daily. Start the day after chemotherapy for 2 days. Take with food. 09/25/22   [name], MD  ondansetron (ZOFRAN) 8 MG tablet Take 1 tablet (8 mg total) by mouth every 8 (eight) hours as needed for nausea, vomiting or refractory nausea / vomiting. Start on the third day after chemotherapy. 07/24/22   [name], MD  predniSONE (DELTASONE) 10 MG tablet Take 3 tabs (30 mg total) daily x 2 days, then 2 tabs (20 mg total) daily x 2 days, then 1 tab (10 mg total) daily x 2 days, then stop. 01/24/23   Hongalgi, Maximino Greenland, MD  prochlorperazine (COMPAZINE) 10 MG tablet Take 1 tablet (10 mg total) by mouth every 6 (six) hours as needed for nausea or vomiting. 07/24/22   [name], MD      Allergies    Shellfish allergy    Review of Systems   Review of Systems  All other systems reviewed and are negative.   Physical Exam Updated Vital Signs BP (!) 142/107 (BP Location: Left Arm)   Pulse 75   Temp 98.2 F (36.8 C)   Resp 18   Wt (!) 140.2 kg   SpO2 97%   BMI 41.91 kg/m  Physical Exam Vitals and nursing note reviewed.  Constitutional:      General: He is not in acute distress.    Appearance: He is well-developed.  HENT:     Head: Normocephalic and atraumatic.  Eyes:     Conjunctiva/sclera: Conjunctivae normal.  Cardiovascular:     Rate and Rhythm: Normal rate and regular rhythm.     Heart sounds: No murmur heard. Pulmonary:     Effort: Pulmonary effort is normal. No respiratory distress.     Breath sounds: Normal breath sounds.  Abdominal:     Palpations: Abdomen is soft.     Tenderness: There is no abdominal tenderness.  Musculoskeletal:        General: No swelling.        Arms:     Cervical back: Neck supple.     Comments: Paraspinal tenderness as above.  Muscular strength 5 out of 5 bilateral upper lower extremities.  No sensory deficits along major nerves regions of upper lower extremities.  DTR symmetric at patella, Achilles.  Pedal and radial pulses 2+ bilaterally.  Full range of motion bilateral upper lower extremities.  Pain elicited with horizontal abduction of right shoulder at 90 degree flexion.  Skin:    General: Skin is warm and dry.     Capillary Refill: Capillary refill takes less than 2 seconds.  Neurological:     Mental Status: He is alert.  Psychiatric:        Mood and Affect: Mood normal.     ED Results / Procedures / Treatments   Labs (all labs ordered are listed, but only abnormal results are displayed) Labs Reviewed - No data to display  EKG None  Radiology No results found.  Procedures Procedures    Medications Ordered in ED Medications  lidocaine (LIDODERM) 5 % 1 patch (1 patch Transdermal Patch Applied 03/22/23 1350)  ketorolac (TORADOL) 30 MG/ML injection 30 mg (30 mg Intramuscular Given 03/22/23 1350)  methocarbamol (ROBAXIN) tablet 500 mg (500 mg Oral Given 03/22/23 1350)    ED Course/ Medical Decision Making/ A&P                                 Medical Decision Making Risk Prescription drug management.   This patient presents to the ED for concern of back pain, this involves an extensive number of treatment options, and is a complaint that carries with it a high risk of complications and morbidity.  The differential diagnosis includes fracture, strain/sprain, dislocation, ligamentous/tendinous injury, neurovasc compromise, GERD, AAA, aortic dissection, ACS, PE, other   Co morbidities that complicate the patient evaluation  See HPI   Additional history obtained:  Additional history obtained from EMR External records from outside source obtained and reviewed including hospital records   Lab  Tests:  Patient declined  Imaging Studies ordered:  Patient declined   Cardiac Monitoring: / EKG:  The patient was maintained on a cardiac monitor.  I personally viewed and interpreted the cardiac monitored which showed an underlying rhythm of: Sinus rhythm   Consultations Obtained:  N/a   Problem List / ED Course / Critical interventions / Medication management  Back pain I ordered medication including Toradol, Robaxin, Lidoderm   Reevaluation of the patient after these medicines showed that the patient improved I have reviewed the patients home medicines and have made adjustments as needed   Social Determinants of Health:  Cigarette use.  Denies illicit drug use.  Test / Admission - Considered:  Back pain Vitals signs significant for blood pressure 142 systolic.Marland Kitchen Otherwise within normal range and stable throughout visit. 33 year old male presents emergency department complaints of right-sided back pain.  Back pain present for the past 2 days and occurred after he woke up from sleep.  On exam, tenderness paraspinally thoracic region as above.  Pain worsened with movement of left shoulder horizontal abduction with shoulder at 90 degrees flexion.  No chest pain rated back, pulse deficits, neurodeficits, significant hypertension; low suspicion for dissection.  Symptoms not exacerbated with consumption of food/liquids/positions; low suspicion for GERD/gastritis/PUD/DU D.  Patient without exertional worsening of symptoms or accompanying shortness of breath/chest pain or without tachycardia, tachypnea, hypoxia; low suspicion for ACS, PE.  No traumatic mechanism concerning for fracture/dislocation.  Breath sounds present bilaterally and lungs clear to auscultation bilaterally; low suspicion for pneumothorax.  Patient did note significant improvement with TENS unit at home when it is working.  Suspect muscular etiology.  Patient reports history of similar symptoms around a year ago with  improvement with Tylenol and muscle relaxer.  Offered patient imaging/laboratory studies but he declined.  Treated with medicines and did note improvement while in the ED.  Will treat patient similarly as he was treated around a year ago with muscle laxer as needed, continue Tylenol and topical numbing agent.  Follow-up with PCP recommended for reevaluation.  Treatment plan discussed at length with patient and he acknowledged understanding was agreeable to said plan.  Patient overall well-appearing, afebrile in no acute distress. Worrisome signs and symptoms were discussed with the patient, and the patient acknowledged understanding to return to the ED if noticed. Patient was stable upon discharge.          Final Clinical Impression(s) / ED Diagnoses Final diagnoses:  Acute right-sided back pain, unspecified back location    Rx / DC Orders ED Discharge Orders          Ordered    methocarbamol (ROBAXIN) 500 MG tablet  2 times daily        03/22/23 1334    lidocaine (LIDODERM) 5 %  Every 24 hours        03/22/23 1342              Peter Garter, [place] 03/22/23 1604    Vanetta Mulders, MD 03/22/23 2236

## 2023-03-22 NOTE — ED Triage Notes (Addendum)
 Here by POV from home for back pain pinpointed to below R shoulder blade. Relates to "pulled muscle". Onset Thursday morning upon waking. Denies fall, injury, or heavy lifting. H/o similar. Denies sx other than pain. No relief with tylenol. Worse with movement. Rates 9/10.

## 2023-03-22 NOTE — Discharge Instructions (Addendum)
 Recommend continues of Tylenol at home for your pain.  Will send a muscle laxer to use as needed as well as numbing patches.  Recommend follow-up with your primary care for reassessment.  Please do not hesitate to return if the worrisome signs and symptoms we discussed become apparent.

## 2023-03-24 ENCOUNTER — Inpatient Hospital Stay: Payer: 59

## 2023-03-24 ENCOUNTER — Inpatient Hospital Stay: Payer: 59 | Admitting: Hematology

## 2023-03-24 DIAGNOSIS — Z5111 Encounter for antineoplastic chemotherapy: Secondary | ICD-10-CM | POA: Diagnosis not present

## 2023-03-24 DIAGNOSIS — C7A1 Malignant poorly differentiated neuroendocrine tumors: Secondary | ICD-10-CM

## 2023-03-24 DIAGNOSIS — Z7189 Other specified counseling: Secondary | ICD-10-CM

## 2023-03-24 DIAGNOSIS — Z95828 Presence of other vascular implants and grafts: Secondary | ICD-10-CM

## 2023-03-24 LAB — CMP (CANCER CENTER ONLY)
ALT: 13 U/L (ref 0–44)
AST: 18 U/L (ref 15–41)
Albumin: 4.1 g/dL (ref 3.5–5.0)
Alkaline Phosphatase: 72 U/L (ref 38–126)
Anion gap: 5 (ref 5–15)
BUN: 10 mg/dL (ref 6–20)
CO2: 27 mmol/L (ref 22–32)
Calcium: 9.1 mg/dL (ref 8.9–10.3)
Chloride: 109 mmol/L (ref 98–111)
Creatinine: 0.8 mg/dL (ref 0.61–1.24)
GFR, Estimated: >60 mL/min (ref >60)
Glucose, Bld: 98 mg/dL (ref 70–99)
Potassium: 3.4 mmol/L — ABNORMAL LOW (ref 3.5–5.1)
Sodium: 141 mmol/L (ref 135–145)
Total Bilirubin: 0.8 mg/dL (ref 0.0–1.2)
Total Protein: 6.8 g/dL (ref 6.5–8.1)

## 2023-03-24 LAB — CBC WITH DIFFERENTIAL (CANCER CENTER ONLY)
Abs Immature Granulocytes: 0.01 10*3/uL (ref 0.00–0.07)
Basophils Absolute: 0 10*3/uL (ref 0.0–0.1)
Basophils Relative: 0 %
Eosinophils Absolute: 0.1 10*3/uL (ref 0.0–0.5)
Eosinophils Relative: 4 %
HCT: 34.1 % — ABNORMAL LOW (ref 39.0–52.0)
Hemoglobin: 11.3 g/dL — ABNORMAL LOW (ref 13.0–17.0)
Immature Granulocytes: 0 %
Lymphocytes Relative: 22 %
Lymphs Abs: 0.6 10*3/uL — ABNORMAL LOW (ref 0.7–4.0)
MCH: 28.2 pg (ref 26.0–34.0)
MCHC: 33.1 g/dL (ref 30.0–36.0)
MCV: 85 fL (ref 80.0–100.0)
Monocytes Absolute: 0.3 10*3/uL (ref 0.1–1.0)
Monocytes Relative: 11 %
Neutro Abs: 1.8 10*3/uL (ref 1.7–7.7)
Neutrophils Relative %: 63 %
Platelet Count: 123 10*3/uL — ABNORMAL LOW (ref 150–400)
RBC: 4.01 MIL/uL — ABNORMAL LOW (ref 4.22–5.81)
RDW: 14.9 % (ref 11.5–15.5)
WBC Count: 2.9 10*3/uL — ABNORMAL LOW (ref 4.0–10.5)
nRBC: 0 % (ref 0.0–0.2)

## 2023-03-24 MED ORDER — SODIUM CHLORIDE 0.9 % IV SOLN
Freq: Once | INTRAVENOUS | Status: AC
Start: 2023-03-24 — End: 2023-03-24

## 2023-03-24 MED ORDER — SODIUM CHLORIDE 0.9 % IV SOLN
2400.0000 mg/m2 | INTRAVENOUS | Status: DC
  Administered 2023-03-24: 6100 mg via INTRAVENOUS
  Filled 2023-03-24: qty 122

## 2023-03-24 MED ORDER — FLUOROURACIL CHEMO INJECTION 2.5 GM/50ML
400.0000 mg/m2 | Freq: Once | INTRAVENOUS | Status: AC
  Administered 2023-03-24: 1000 mg via INTRAVENOUS
  Filled 2023-03-24: qty 20

## 2023-03-24 MED ORDER — PALONOSETRON HCL INJECTION 0.25 MG/5ML
0.2500 mg | Freq: Once | INTRAVENOUS | Status: AC
  Administered 2023-03-24: 0.25 mg via INTRAVENOUS
  Filled 2023-03-24: qty 5

## 2023-03-24 MED ORDER — SODIUM CHLORIDE 0.9% FLUSH
10.0000 mL | Freq: Once | INTRAVENOUS | Status: AC
  Administered 2023-03-24: 10 mL

## 2023-03-24 MED ORDER — SODIUM CHLORIDE 0.9 % IV SOLN
180.0000 mg/m2 | Freq: Once | INTRAVENOUS | Status: AC
  Administered 2023-03-24: 460 mg via INTRAVENOUS
  Filled 2023-03-24: qty 15

## 2023-03-24 MED ORDER — SODIUM CHLORIDE 0.9 % IV SOLN
400.0000 mg/m2 | Freq: Once | INTRAVENOUS | Status: AC
  Administered 2023-03-24: 1020 mg via INTRAVENOUS
  Filled 2023-03-24: qty 51

## 2023-03-24 MED ORDER — DEXAMETHASONE SODIUM PHOSPHATE 10 MG/ML IJ SOLN
10.0000 mg | Freq: Once | INTRAMUSCULAR | Status: AC
  Administered 2023-03-24: 10 mg via INTRAVENOUS
  Filled 2023-03-24: qty 1

## 2023-03-24 MED ORDER — ATROPINE SULFATE 1 MG/ML IV SOLN
0.5000 mg | Freq: Once | INTRAVENOUS | Status: AC | PRN
  Administered 2023-03-24: 0.5 mg via INTRAVENOUS
  Filled 2023-03-24: qty 1

## 2023-03-26 ENCOUNTER — Encounter: Payer: Self-pay | Admitting: Hematology

## 2023-03-26 ENCOUNTER — Inpatient Hospital Stay: Payer: 59

## 2023-03-26 VITALS — BP 116/82 | HR 89 | Temp 99.2°F | Resp 18

## 2023-03-26 DIAGNOSIS — Z5111 Encounter for antineoplastic chemotherapy: Secondary | ICD-10-CM | POA: Diagnosis not present

## 2023-03-26 DIAGNOSIS — Z95828 Presence of other vascular implants and grafts: Secondary | ICD-10-CM

## 2023-03-26 MED ORDER — SODIUM CHLORIDE 0.9% FLUSH
10.0000 mL | Freq: Once | INTRAVENOUS | Status: AC
  Administered 2023-03-26: 10 mL

## 2023-03-26 MED ORDER — HEPARIN SOD (PORK) LOCK FLUSH 100 UNIT/ML IV SOLN
500.0000 [IU] | Freq: Once | INTRAVENOUS | Status: AC
  Administered 2023-03-26: 500 [IU]

## 2023-04-03 ENCOUNTER — Other Ambulatory Visit: Payer: Self-pay

## 2023-04-03 DIAGNOSIS — C7A1 Malignant poorly differentiated neuroendocrine tumors: Secondary | ICD-10-CM

## 2023-04-07 ENCOUNTER — Inpatient Hospital Stay: Payer: 59 | Attending: Hematology

## 2023-04-07 ENCOUNTER — Inpatient Hospital Stay (HOSPITAL_BASED_OUTPATIENT_CLINIC_OR_DEPARTMENT_OTHER): Payer: 59 | Admitting: Hematology

## 2023-04-07 ENCOUNTER — Encounter: Payer: Self-pay | Admitting: Hematology

## 2023-04-07 ENCOUNTER — Inpatient Hospital Stay: Payer: 59

## 2023-04-07 VITALS — BP 141/103 | HR 100 | Temp 97.5°F | Resp 18 | Wt 310.7 lb

## 2023-04-07 DIAGNOSIS — C7B8 Other secondary neuroendocrine tumors: Secondary | ICD-10-CM | POA: Diagnosis not present

## 2023-04-07 DIAGNOSIS — Z95828 Presence of other vascular implants and grafts: Secondary | ICD-10-CM

## 2023-04-07 DIAGNOSIS — Z7189 Other specified counseling: Secondary | ICD-10-CM | POA: Diagnosis not present

## 2023-04-07 DIAGNOSIS — F17210 Nicotine dependence, cigarettes, uncomplicated: Secondary | ICD-10-CM | POA: Diagnosis not present

## 2023-04-07 DIAGNOSIS — C7A1 Malignant poorly differentiated neuroendocrine tumors: Secondary | ICD-10-CM

## 2023-04-07 DIAGNOSIS — Z5111 Encounter for antineoplastic chemotherapy: Secondary | ICD-10-CM | POA: Insufficient documentation

## 2023-04-07 LAB — CMP (CANCER CENTER ONLY)
ALT: 447 U/L (ref 0–44)
AST: 234 U/L (ref 15–41)
Albumin: 4.2 g/dL (ref 3.5–5.0)
Alkaline Phosphatase: 218 U/L — ABNORMAL HIGH (ref 38–126)
Anion gap: 5 (ref 5–15)
BUN: 9 mg/dL (ref 6–20)
CO2: 27 mmol/L (ref 22–32)
Calcium: 9.1 mg/dL (ref 8.9–10.3)
Chloride: 108 mmol/L (ref 98–111)
Creatinine: 0.76 mg/dL (ref 0.61–1.24)
GFR, Estimated: >60 mL/min (ref >60)
Glucose, Bld: 141 mg/dL — ABNORMAL HIGH (ref 70–99)
Potassium: 3.5 mmol/L (ref 3.5–5.1)
Sodium: 140 mmol/L (ref 135–145)
Total Bilirubin: 0.9 mg/dL (ref 0.0–1.2)
Total Protein: 7.3 g/dL (ref 6.5–8.1)

## 2023-04-07 LAB — CBC WITH DIFFERENTIAL (CANCER CENTER ONLY)
Abs Immature Granulocytes: 0 10*3/uL (ref 0.00–0.07)
Basophils Absolute: 0 10*3/uL (ref 0.0–0.1)
Basophils Relative: 0 %
Eosinophils Absolute: 0.2 10*3/uL (ref 0.0–0.5)
Eosinophils Relative: 6 %
HCT: 34.6 % — ABNORMAL LOW (ref 39.0–52.0)
Hemoglobin: 11.4 g/dL — ABNORMAL LOW (ref 13.0–17.0)
Immature Granulocytes: 0 %
Lymphocytes Relative: 9 %
Lymphs Abs: 0.3 10*3/uL — ABNORMAL LOW (ref 0.7–4.0)
MCH: 28.2 pg (ref 26.0–34.0)
MCHC: 32.9 g/dL (ref 30.0–36.0)
MCV: 85.6 fL (ref 80.0–100.0)
Monocytes Absolute: 0.4 10*3/uL (ref 0.1–1.0)
Monocytes Relative: 12 %
Neutro Abs: 2.1 10*3/uL (ref 1.7–7.7)
Neutrophils Relative %: 73 %
Platelet Count: 113 10*3/uL — ABNORMAL LOW (ref 150–400)
RBC: 4.04 MIL/uL — ABNORMAL LOW (ref 4.22–5.81)
RDW: 15.2 % (ref 11.5–15.5)
WBC Count: 2.9 10*3/uL — ABNORMAL LOW (ref 4.0–10.5)
nRBC: 0 % (ref 0.0–0.2)

## 2023-04-07 MED ORDER — HEPARIN SOD (PORK) LOCK FLUSH 100 UNIT/ML IV SOLN
500.0000 [IU] | Freq: Once | INTRAVENOUS | Status: AC
  Administered 2023-04-07: 500 [IU]

## 2023-04-07 MED ORDER — SODIUM CHLORIDE 0.9% FLUSH
10.0000 mL | Freq: Once | INTRAVENOUS | Status: AC
  Administered 2023-04-07: 10 mL

## 2023-04-07 NOTE — Progress Notes (Signed)
 Patient seen by Dr. Addison Naegeli are within treatment parameters.  Labs reviewed: and are not all within treatment parameters.   AST 234, ALT 447 Dr Candise Che aware  Per physician team, patient will not be receiving treatment today.

## 2023-04-09 ENCOUNTER — Inpatient Hospital Stay: Payer: 59

## 2023-04-11 ENCOUNTER — Ambulatory Visit (HOSPITAL_COMMUNITY)
Admission: RE | Admit: 2023-04-11 | Discharge: 2023-04-11 | Disposition: A | Discharge status: Home / Self Care | Source: Ambulatory Visit | Attending: Hematology | Admitting: Hematology

## 2023-04-11 DIAGNOSIS — C7A1 Malignant poorly differentiated neuroendocrine tumors: Secondary | ICD-10-CM | POA: Diagnosis present

## 2023-04-11 MED ORDER — HEPARIN SOD (PORK) LOCK FLUSH 100 UNIT/ML IV SOLN
INTRAVENOUS | Status: AC
  Filled 2023-04-11: qty 5

## 2023-04-11 MED ORDER — HEPARIN SOD (PORK) LOCK FLUSH 100 UNIT/ML IV SOLN
500.0000 [IU] | Freq: Once | INTRAVENOUS | Status: AC
  Administered 2023-04-11: 500 [IU] via INTRAVENOUS

## 2023-04-11 MED ORDER — IOHEXOL 300 MG/ML  SOLN
100.0000 mL | Freq: Once | INTRAMUSCULAR | Status: AC | PRN
  Administered 2023-04-11: 100 mL via INTRAVENOUS

## 2023-04-11 NOTE — Progress Notes (Signed)
 Patient ID: Leon Liv., male   DOB: 10/08/1990, 33 y.o.   MRN: [id number] Patient presented to [facility] CT dept today for outpatient CT chest abdomen pelvis- hx neuroendocrine tumor of pancreas; notified by CT tech that there was an approximately 35 cc contrast extravasation around patient's accessed right chest wall Port-A-Cath (port initially placed by IR team on 07/31/21). Site was examined and revealed intact port a cath with surrounding skin soft to touch, no tenderness or drainage noted, some mild SQ edema. Findings d/w Dr. Fredia Sorrow and he recommended conservative management with prn ice pack to site. Pt given IR contact number to call if he develops increasing pain/swelling at site or malfunction of port. Lead CT tech also notified of occurrence.

## 2023-04-13 ENCOUNTER — Encounter: Payer: Self-pay | Admitting: Hematology

## 2023-04-13 NOTE — Progress Notes (Signed)
 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   HISTORY OF PRESENTING ILLNESS:  Please see previous note for details on initial presentation  INTERVAL HISTORY:  Leon Fischer. Is a 33 y.o. 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 He is here for cycle 17 day 1 of his Folfiri treatment.   No acute new toxicities or symptoms today. Labs show significantly elevated LFTs and chemo held today. Urgent CT CAP ordered.  MEDICAL HISTORY:  Past Medical History:  Diagnosis Date   Asthma    Neuroendocrine cancer (HCC)     SURGICAL HISTORY: Past Surgical History:  Procedure Laterality Date   BILIARY DILATION  07/13/2021   Procedure: BILIARY DILATION;  Surgeon: Vida Rigger, MD;  Location: [facility];  Service: Gastroenterology;;   BILIARY STENT PLACEMENT N/A 07/13/2021   Procedure: BILIARY STENT PLACEMENT;  Surgeon: Vida Rigger, MD;  Location: [name];  Service: Gastroenterology;  Laterality: N/A;   BILIARY STENT PLACEMENT N/A 11/05/2021   Procedure: BILIARY STENT PLACEMENT;  Surgeon: Vida Rigger, MD;  Location: [name];  Service: Gastroenterology;  Laterality: N/A;   BIOPSY  07/13/2021   Procedure: BIOPSY;  Surgeon: Meridee Score Netty Starring., MD;  Location: [facility];  Service: Gastroenterology;;   CHOLECYSTECTOMY N/A 11/07/2021   Procedure: LAPAROSCOPIC CHOLECYSTECTOMY;  Surgeon: Berna Bue, MD;  Location: [facility];  Service: General;  Laterality: N/A;   ENDOSCOPIC RETROGRADE CHOLANGIOPANCREATOGRAPHY (ERCP) WITH PROPOFOL N/A 07/13/2021   Procedure: ENDOSCOPIC RETROGRADE  CHOLANGIOPANCREATOGRAPHY (ERCP) WITH PROPOFOL;  Surgeon: Vida Rigger, MD;  Location: [name];  Service: Gastroenterology;  Laterality: N/A;   ERCP N/A 11/05/2021   Procedure: ENDOSCOPIC RETROGRADE CHOLANGIOPANCREATOGRAPHY (ERCP);  Surgeon: Vida Rigger, MD;  Location: [facility];  Service: Gastroenterology;  Laterality: N/A;   ESOPHAGOGASTRODUODENOSCOPY (EGD) WITH PROPOFOL N/A 07/13/2021   Procedure: ESOPHAGOGASTRODUODENOSCOPY (EGD) WITH PROPOFOL;  Surgeon: Meridee Score Netty Starring., MD;  Location: [name];  Service: Gastroenterology;  Laterality: N/A;   EUS N/A 07/13/2021   Procedure: UPPER ENDOSCOPIC ULTRASOUND (EUS) LINEAR;  Surgeon: Lemar Lofty., MD;  Location: [name];  Service: Gastroenterology;  Laterality: N/A;   FINE NEEDLE ASPIRATION  07/13/2021   Procedure: FINE NEEDLE ASPIRATION (FNA) LINEAR;  Surgeon: Lemar Lofty., MD;  Location: [facility];  Service: Gastroenterology;;   IR IMAGING GUIDED PORT INSERTION  07/31/2021   SPHINCTEROTOMY  07/13/2021   Procedure: Dennison Mascot;  Surgeon: Vida Rigger, MD;  Location: [name];  Service: Gastroenterology;;    SOCIAL HISTORY: Social History   Socioeconomic History   Marital status: Single    Spouse name: Not on file   Number of children: 0   Years of education: Not on file   Highest education level: Some college, no degree  Occupational History   Not on file  Tobacco Use   Smoking status: Some Days    Types: Cigarettes   Smokeless tobacco: Never  Vaping Use   Vaping status: Never Used  Substance and Sexual Activity   Alcohol use: Yes    Comment: social   Drug use: No   Sexual activity: Never  Other Topics Concern   Not on file  Social History Narrative   Not on file   Social Drivers of Health   Financial Resource Strain: Low  Risk  (04/14/2017)   Overall Financial Resource Strain (CARDIA)    Difficulty of Paying Living Expenses: Not hard at all  Food Insecurity: No Food Insecurity  (01/21/2023)   Hunger Vital Sign    Worried About Running Out of Food in the Last Year: Never true    Ran Out of Food in the Last Year: Never true  Transportation Needs: No Transportation Needs (01/21/2023)   PRAPARE - Administrator, Civil Service (Medical): No    Lack of Transportation (Non-Medical): No  Physical Activity: Inactive (04/14/2017)   Exercise Vital Sign    Days of Exercise per Week: 0 days    Minutes of Exercise per Session: 0 min  Stress: Stress Concern Present (04/14/2017)   [name] of Occupational Health - Occupational Stress Questionnaire    Feeling of Stress : Rather much  Social Connections: Unknown (06/19/2021)   Received [facility]   Social Network    Social Network: Not on file  Intimate Partner Violence: Not At Risk (01/21/2023)   Humiliation, Afraid, Rape, and Kick questionnaire    Fear of Current or Ex-Partner: No    Emotionally Abused: No    Physically Abused: No    Sexually Abused: No    FAMILY HISTORY: Family History  Problem Relation Age of Onset   Drug abuse Father    ALLERGIES:  is allergic to shellfish allergy.  MEDICATIONS:  Current Outpatient Medications  Medication Sig Dispense Refill   acetaminophen (TYLENOL) 500 MG tablet Take 1,000 mg by mouth every 6 (six) hours as needed for mild pain.     albuterol (VENTOLIN HFA) 108 (90 Base) MCG/ACT inhaler Inhale 1-2 puffs into the lungs every 6 (six) hours as needed for wheezing or shortness of breath. 18 g 0   dexamethasone (DECADRON) 4 MG tablet Take 2 tablets (8 mg total) by mouth daily. Start the day after chemotherapy for 2 days. Take with food. 8 tablet 5   lidocaine (LIDODERM) 5 % Place 1 patch onto the skin daily. Remove & Discard patch within 12 hours or as directed by MD 30 patch 0   methocarbamol (ROBAXIN) 500 MG tablet Take 1 tablet (500 mg total) by mouth 2 (two) times daily. 20 tablet 0   ondansetron (ZOFRAN) 8 MG tablet Take 1 tablet (8 mg  total) by mouth every 8 (eight) hours as needed for nausea, vomiting or refractory nausea / vomiting. Start on the third day after chemotherapy. 30 tablet 1   predniSONE (DELTASONE) 10 MG tablet Take 3 tabs (30 mg total) daily x 2 days, then 2 tabs (20 mg total) daily x 2 days, then 1 tab (10 mg total) daily x 2 days, then stop. 12 tablet 0   prochlorperazine (COMPAZINE) 10 MG tablet Take 1 tablet (10 mg total) by mouth every 6 (six) hours as needed for nausea or vomiting. 30 tablet 1   No current facility-administered medications for this visit.    REVIEW OF SYSTEMS:    10 Point review of Systems was done is negative except as noted above.   PHYSICAL EXAMINATION: ECOG PERFORMANCE STATUS: 1 - Symptomatic but completely ambulatory .BP (!) 141/103 (BP Location: Left Arm, Patient Position: Sitting)   Pulse 100   Temp (!) 97.5 F (36.4 C) (Temporal)   Resp 18   Wt (!) 310 lb 11.2 oz (140.9 kg)   SpO2 100%   BMI 42.14 kg/m   GENERAL:alert, in no acute distress and comfortable SKIN: no acute  rashes, no significant lesions EYES: conjunctiva are pink and non-injected, sclera anicteric OROPHARYNX: MMM, no exudates, no oropharyngeal erythema or ulceration NECK: supple, no JVD LYMPH:  no palpable lymphadenopathy in the cervical, axillary or inguinal regions LUNGS: clear to auscultation b/l with normal respiratory effort HEART: regular rate & rhythm ABDOMEN:  normoactive bowel sounds , non tender, not distended. Extremity: no pedal edema PSYCH: alert & oriented x 3 with fluent speech NEURO: no focal motor/sensory deficits  LABORATORY DATA:  I have reviewed the data as listed    Latest Ref Rng & Units 04/07/2023   11:30 AM 03/24/2023   10:10 AM 03/11/2023   10:43 AM  CBC  WBC 4.0 - 10.5 K/uL 2.9  2.9  3.1   Hemoglobin 13.0 - 17.0 g/dL 16.1  09.6  04.5   Hematocrit 39.0 - 52.0 % 34.6  34.1  35.9   Platelets 150 - 400 K/uL 113  123  141   .    Latest Ref Rng & Units 04/07/2023   11:30 AM  03/24/2023   10:10 AM 03/11/2023   10:43 AM  CMP  Glucose 70 - 99 mg/dL 409  98  811   BUN 6 - 20 mg/dL 9  10  11    Creatinine 0.61 - 1.24 mg/dL 9.14  7.82  9.56   Sodium 135 - 145 mmol/L 140  141  139   Potassium 3.5 - 5.1 mmol/L 3.5  3.4  3.8   Chloride 98 - 111 mmol/L 108  109  107   CO2 22 - 32 mmol/L 27  27  28    Calcium 8.9 - 10.3 mg/dL 9.1  9.1  9.3   Total Protein 6.5 - 8.1 g/dL 7.3  6.8  7.2   Total Bilirubin 0.0 - 1.2 mg/dL 0.9  0.8  0.7   Alkaline Phos 38 - 126 U/L 218  72  82   AST 15 - 41 U/L 234  18  17   ALT 0 - 44 U/L 447  13  12     Cytology done 07/13/2021 revealed "FINAL MICROSCOPIC DIAGNOSIS:  A. PANCREAS, HEAD, FINE NEEDLE ASPIRATION:  - Malignant cells present  - Poorly differentiated/high-grade neuroendocrine carcinoma (see  comment)   B. CBD STRICTURE, BRUSHING:  - Atypical cells suspicious for tumor   C. BILIARY DILATION, BALLOON, REMOVAL:  - Atypical cells suspicious for tumor "  Surgical pathology done 07/13/2021 revealed "FINAL MICROSCOPIC DIAGNOSIS:   A. STOMACH, BIOPSY:  Reactive gastropathy and minimal chronic gastritis with lymphoid  aggregate  Negative for H. pylori, intestinal metaplasia, dysplasia and carcinoma"   Pathology 11/07/21: FINAL MICROSCOPIC DIAGNOSIS:   A. GALLBLADDER, CHOLECYSTECTOMY:  - Acute cholecystitis with necrosis and abscess.   RADIOGRAPHIC STUDIES: I have personally reviewed the radiological images as listed and agreed with the findings in the report. No results found.    ASSESSMENT & PLAN:   33 y.o. very pleasant male with  1. 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 -Cytology done 07/13/2021 shows presence of malignant cells in pancreas and poorly differentiated/high grade neuroendocrine carcinoma.  2.  Recent hospitalization from pneumonia likely related to mycoplasma.  Respiratory viral panel showed  rhinovirus/enterovirus.  3.  Hyperpigmentation from 5-FU chemotherapy  4.  Financial and work-related stressors.  PLAN: -Discussed lab results from today, 04/07/2023 with the patient. Significant elevated transaminases concerning for liver mets vs gall bladder issues vs medication toxicity -urgent CT CAP Will hold  treatment today Hold FOLFIRI today due to abnormal LFTs CT chest/abd/pelvis ASAP concern for tumor progression in the liver MD visit in 7- 10 days  FOLLOW-UP:  Hold FOLFIRI today due to abnormal LFTs CT chest/abd/pelvis ASAP concern for tumor progression in the liver MD visit in 7- 10 days   .The total time spent in the appointment was 20 minutes* .  All of the patient's questions were answered with apparent satisfaction. The patient knows to call the clinic with any problems, questions or concerns.   Wyvonnia Lora MD MS AAHIVMS [facility] Hematology/Oncology Physician [facility]  .*Total Encounter Time as defined by the Centers for Medicare and Medicaid Services includes, in addition to the face-to-face time of a patient visit (documented in the note above) non-face-to-face time: obtaining and reviewing outside history, ordering and reviewing medications, tests or procedures, care coordination (communications with other health care professionals or caregivers) and documentation in the medical record.

## 2023-04-21 ENCOUNTER — Inpatient Hospital Stay (HOSPITAL_BASED_OUTPATIENT_CLINIC_OR_DEPARTMENT_OTHER): Payer: 59 | Admitting: Hematology

## 2023-04-21 ENCOUNTER — Inpatient Hospital Stay: Payer: 59

## 2023-04-21 VITALS — HR 100

## 2023-04-21 VITALS — BP 131/86 | HR 111 | Temp 98.0°F | Resp 16 | Wt 302.5 lb

## 2023-04-21 DIAGNOSIS — Z95828 Presence of other vascular implants and grafts: Secondary | ICD-10-CM

## 2023-04-21 DIAGNOSIS — Z7189 Other specified counseling: Secondary | ICD-10-CM

## 2023-04-21 DIAGNOSIS — C7A1 Malignant poorly differentiated neuroendocrine tumors: Secondary | ICD-10-CM | POA: Diagnosis not present

## 2023-04-21 DIAGNOSIS — Z5111 Encounter for antineoplastic chemotherapy: Secondary | ICD-10-CM | POA: Diagnosis not present

## 2023-04-21 DIAGNOSIS — R7989 Other specified abnormal findings of blood chemistry: Secondary | ICD-10-CM | POA: Diagnosis not present

## 2023-04-21 LAB — CBC WITH DIFFERENTIAL (CANCER CENTER ONLY)
Abs Immature Granulocytes: 0.02 10*3/uL (ref 0.00–0.07)
Basophils Absolute: 0 10*3/uL (ref 0.0–0.1)
Basophils Relative: 0 %
Eosinophils Absolute: 0.4 10*3/uL (ref 0.0–0.5)
Eosinophils Relative: 5 %
HCT: 35.5 % — ABNORMAL LOW (ref 39.0–52.0)
Hemoglobin: 11.8 g/dL — ABNORMAL LOW (ref 13.0–17.0)
Immature Granulocytes: 0 %
Lymphocytes Relative: 8 %
Lymphs Abs: 0.7 10*3/uL (ref 0.7–4.0)
MCH: 28.2 pg (ref 26.0–34.0)
MCHC: 33.2 g/dL (ref 30.0–36.0)
MCV: 84.7 fL (ref 80.0–100.0)
Monocytes Absolute: 0.8 10*3/uL (ref 0.1–1.0)
Monocytes Relative: 10 %
Neutro Abs: 6 10*3/uL (ref 1.7–7.7)
Neutrophils Relative %: 77 %
Platelet Count: 196 10*3/uL (ref 150–400)
RBC: 4.19 MIL/uL — ABNORMAL LOW (ref 4.22–5.81)
RDW: 15.8 % — ABNORMAL HIGH (ref 11.5–15.5)
WBC Count: 7.8 10*3/uL (ref 4.0–10.5)
nRBC: 0 % (ref 0.0–0.2)

## 2023-04-21 LAB — CMP (CANCER CENTER ONLY)
ALT: 303 U/L (ref 0–44)
AST: 149 U/L — ABNORMAL HIGH (ref 15–41)
Albumin: 4.3 g/dL (ref 3.5–5.0)
Alkaline Phosphatase: 419 U/L — ABNORMAL HIGH (ref 38–126)
Anion gap: 5 (ref 5–15)
BUN: 11 mg/dL (ref 6–20)
CO2: 27 mmol/L (ref 22–32)
Calcium: 9.2 mg/dL (ref 8.9–10.3)
Chloride: 108 mmol/L (ref 98–111)
Creatinine: 0.72 mg/dL (ref 0.61–1.24)
GFR, Estimated: >60 mL/min (ref >60)
Glucose, Bld: 109 mg/dL — ABNORMAL HIGH (ref 70–99)
Potassium: 3.9 mmol/L (ref 3.5–5.1)
Sodium: 140 mmol/L (ref 135–145)
Total Bilirubin: 0.9 mg/dL (ref 0.0–1.2)
Total Protein: 7.7 g/dL (ref 6.5–8.1)

## 2023-04-21 MED ORDER — FLUOROURACIL CHEMO INJECTION 2.5 GM/50ML
400.0000 mg/m2 | Freq: Once | INTRAVENOUS | Status: AC
  Administered 2023-04-21: 1000 mg via INTRAVENOUS
  Filled 2023-04-21: qty 20

## 2023-04-21 MED ORDER — SODIUM CHLORIDE 0.9% FLUSH: 10.0000 mL | INTRAVENOUS | Status: DC | PRN

## 2023-04-21 MED ORDER — SODIUM CHLORIDE 0.9% FLUSH
10.0000 mL | Freq: Once | INTRAVENOUS | Status: AC
  Administered 2023-04-21: 10 mL

## 2023-04-21 MED ORDER — HEPARIN SOD (PORK) LOCK FLUSH 100 UNIT/ML IV SOLN: 500.0000 [IU] | Freq: Once | INTRAVENOUS | Status: DC | PRN

## 2023-04-21 MED ORDER — SODIUM CHLORIDE 0.9 % IV SOLN
400.0000 mg/m2 | Freq: Once | INTRAVENOUS | Status: AC
  Administered 2023-04-21: 1020 mg via INTRAVENOUS
  Filled 2023-04-21: qty 17.5

## 2023-04-21 MED ORDER — SODIUM CHLORIDE 0.9 % IV SOLN: Freq: Once | INTRAVENOUS | Status: AC

## 2023-04-21 MED ORDER — SODIUM CHLORIDE 0.9 % IV SOLN
125.0000 mg/m2 | Freq: Once | INTRAVENOUS | Status: AC
  Administered 2023-04-21: 300 mg via INTRAVENOUS
  Filled 2023-04-21: qty 15

## 2023-04-21 MED ORDER — PALONOSETRON HCL INJECTION 0.25 MG/5ML
0.2500 mg | Freq: Once | INTRAVENOUS | Status: AC
  Administered 2023-04-21: 0.25 mg via INTRAVENOUS
  Filled 2023-04-21: qty 5

## 2023-04-21 MED ORDER — SODIUM CHLORIDE 0.9 % IV SOLN
2400.0000 mg/m2 | INTRAVENOUS | Status: DC
  Administered 2023-04-21: 6100 mg via INTRAVENOUS
  Filled 2023-04-21: qty 122

## 2023-04-21 MED ORDER — DEXAMETHASONE SODIUM PHOSPHATE 10 MG/ML IJ SOLN
10.0000 mg | Freq: Once | INTRAMUSCULAR | Status: AC
  Administered 2023-04-21: 10 mg via INTRAVENOUS
  Filled 2023-04-21: qty 1

## 2023-04-21 NOTE — Progress Notes (Signed)
 Patient seen by Dr. Addison Naegeli are within treatment parameters.  Labs reviewed: and are not all within treatment parameters.   Dr Candise Che aware: ALT: 303, AST: 149,   Per physician team, patient is ready for treatment. Please note that modifications are being made to the treatment plan including   Irinotecan dose being adjusted down. Pharmacy aware

## 2023-04-21 NOTE — Progress Notes (Signed)
 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   HISTORY OF PRESENTING ILLNESS:  Please see previous note for details on initial presentation  INTERVAL HISTORY:  Leon Fischer. Is a 33 y.o. 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 He is here for cycle 17 day 1 of his Folfiri treatment.   Patient was last seen by me on 04/07/2023 and he was doing well overall. However, his treatment was held due to abnormal LFTs.   Patient is accompanied by his mother during this visit. Patient notes he has been doing well overall since our last visit. He denies any new infection issues, fever, chills, night sweats, unexpected weight loss, back pain, chest pain, abnormal bowel movement, abdominal pain, appetite loss, or leg swelling.   He notes that he occasionally drinks alcohol.    MEDICAL HISTORY:  Past Medical History:  Diagnosis Date   Asthma    Neuroendocrine cancer (HCC)     SURGICAL HISTORY: Past Surgical History:  Procedure Laterality Date   BILIARY DILATION  07/13/2021   Procedure: BILIARY DILATION;  Surgeon: Vida Rigger, MD;  Location: [facility];  Service: Gastroenterology;;   BILIARY STENT PLACEMENT N/A 07/13/2021   Procedure: BILIARY STENT PLACEMENT;  Surgeon: Vida Rigger, MD;  Location: [name];  Service: Gastroenterology;  Laterality: N/A;   BILIARY STENT PLACEMENT N/A 11/05/2021   Procedure: BILIARY STENT PLACEMENT;  Surgeon: Vida Rigger, MD;  Location: [name];  Service: Gastroenterology;  Laterality: N/A;   BIOPSY  07/13/2021   Procedure: BIOPSY;  Surgeon: Meridee Score Netty Starring., MD;   Location: [facility];  Service: Gastroenterology;;   CHOLECYSTECTOMY N/A 11/07/2021   Procedure: LAPAROSCOPIC CHOLECYSTECTOMY;  Surgeon: Berna Bue, MD;  Location: [facility];  Service: General;  Laterality: N/A;   ENDOSCOPIC RETROGRADE CHOLANGIOPANCREATOGRAPHY (ERCP) WITH PROPOFOL N/A 07/13/2021   Procedure: ENDOSCOPIC RETROGRADE CHOLANGIOPANCREATOGRAPHY (ERCP) WITH PROPOFOL;  Surgeon: Vida Rigger, MD;  Location: [name];  Service: Gastroenterology;  Laterality: N/A;   ERCP N/A 11/05/2021   Procedure: ENDOSCOPIC RETROGRADE CHOLANGIOPANCREATOGRAPHY (ERCP);  Surgeon: Vida Rigger, MD;  Location: [facility];  Service: Gastroenterology;  Laterality: N/A;   ESOPHAGOGASTRODUODENOSCOPY (EGD) WITH PROPOFOL N/A 07/13/2021   Procedure: ESOPHAGOGASTRODUODENOSCOPY (EGD) WITH PROPOFOL;  Surgeon: Meridee Score Netty Starring., MD;  Location: [name];  Service: Gastroenterology;  Laterality: N/A;   EUS N/A 07/13/2021   Procedure: UPPER ENDOSCOPIC ULTRASOUND (EUS) LINEAR;  Surgeon: Lemar Lofty., MD;  Location: [name];  Service: Gastroenterology;  Laterality: N/A;   FINE NEEDLE ASPIRATION  07/13/2021   Procedure: FINE NEEDLE ASPIRATION (FNA) LINEAR;  Surgeon: Meridee Score Netty Starring., MD;  Location: [facility];  Service: Gastroenterology;;   IR IMAGING GUIDED PORT INSERTION  07/31/2021   SPHINCTEROTOMY  07/13/2021   Procedure: Dennison Mascot;  Surgeon: Vida Rigger, MD;  Location: [name];  Service: Gastroenterology;;    SOCIAL HISTORY: Social History   Socioeconomic History   Marital status: Single    Spouse name: Not on file   Number of children: 0   Years of education: Not on file   Highest education level: Some college, no degree  Occupational History   Not on file  Tobacco Use   Smoking status: Some Days    Types: Cigarettes   Smokeless  tobacco: Never  Vaping Use   Vaping status: Never Used  Substance and Sexual Activity   Alcohol use: Yes    Comment: social   Drug use:  No   Sexual activity: Never  Other Topics Concern   Not on file  Social History Narrative   Not on file   Social Drivers of Health   Financial Resource Strain: Low Risk  (04/14/2017)   Overall Financial Resource Strain (CARDIA)    Difficulty of Paying Living Expenses: Not hard at all  Food Insecurity: No Food Insecurity (01/21/2023)   Hunger Vital Sign    Worried About Running Out of Food in the Last Year: Never true    Ran Out of Food in the Last Year: Never true  Transportation Needs: No Transportation Needs (01/21/2023)   PRAPARE - Administrator, Civil Service (Medical): No    Lack of Transportation (Non-Medical): No  Physical Activity: Inactive (04/14/2017)   Exercise Vital Sign    Days of Exercise per Week: 0 days    Minutes of Exercise per Session: 0 min  Stress: Stress Concern Present (04/14/2017)   [name] of Occupational Health - Occupational Stress Questionnaire    Feeling of Stress : Rather much  Social Connections: Unknown (06/19/2021)   Received from [facility]   Social Network    Social Network: Not on file  Intimate Partner Violence: Not At Risk (01/21/2023)   Humiliation, Afraid, Rape, and Kick questionnaire    Fear of Current or Ex-Partner: No    Emotionally Abused: No    Physically Abused: No    Sexually Abused: No    FAMILY HISTORY: Family History  Problem Relation Age of Onset   Drug abuse Father    ALLERGIES:  is allergic to shellfish allergy.  MEDICATIONS:  Current Outpatient Medications  Medication Sig Dispense Refill   acetaminophen (TYLENOL) 500 MG tablet Take 1,000 mg by mouth every 6 (six) hours as needed for mild pain.     albuterol (VENTOLIN HFA) 108 (90 Base) MCG/ACT inhaler Inhale 1-2 puffs into the lungs every 6 (six) hours as needed for wheezing or shortness of breath. 18 g 0   dexamethasone (DECADRON) 4 MG tablet Take 2 tablets (8 mg total) by mouth daily. Start the day after chemotherapy for 2  days. Take with food. 8 tablet 5   lidocaine (LIDODERM) 5 % Place 1 patch onto the skin daily. Remove & Discard patch within 12 hours or as directed by MD 30 patch 0   methocarbamol (ROBAXIN) 500 MG tablet Take 1 tablet (500 mg total) by mouth 2 (two) times daily. 20 tablet 0   ondansetron (ZOFRAN) 8 MG tablet Take 1 tablet (8 mg total) by mouth every 8 (eight) hours as needed for nausea, vomiting or refractory nausea / vomiting. Start on the third day after chemotherapy. 30 tablet 1   predniSONE (DELTASONE) 10 MG tablet Take 3 tabs (30 mg total) daily x 2 days, then 2 tabs (20 mg total) daily x 2 days, then 1 tab (10 mg total) daily x 2 days, then stop. 12 tablet 0   prochlorperazine (COMPAZINE) 10 MG tablet Take 1 tablet (10 mg total) by mouth every 6 (six) hours as needed for nausea or vomiting. 30 tablet 1   No current facility-administered medications for this visit.    REVIEW OF SYSTEMS:    10 Point review of Systems was done is negative except as noted above.   PHYSICAL EXAMINATION: ECOG  PERFORMANCE STATUS: 1 - Symptomatic but completely ambulatory .BP 131/86 (BP Location: Left Arm, Patient Position: Sitting)   Pulse (!) 111   Temp 98 F (36.7 C) (Temporal)   Resp 16   Wt (!) 302 lb 8 oz (137.2 kg)   SpO2 98%   BMI 41.03 kg/m   GENERAL:alert, in no acute distress and comfortable SKIN: no acute rashes, no significant lesions EYES: conjunctiva are pink and non-injected, sclera anicteric OROPHARYNX: MMM, no exudates, no oropharyngeal erythema or ulceration NECK: supple, no JVD LYMPH:  no palpable lymphadenopathy in the cervical, axillary or inguinal regions LUNGS: clear to auscultation b/l with normal respiratory effort HEART: regular rate & rhythm ABDOMEN:  normoactive bowel sounds , non tender, not distended. Extremity: no pedal edema PSYCH: alert & oriented x 3 with fluent speech NEURO: no focal motor/sensory deficits  LABORATORY DATA:  I have reviewed the data as  listed    Latest Ref Rng & Units 04/21/2023   10:26 AM 04/07/2023   11:30 AM 03/24/2023   10:10 AM  CBC  WBC 4.0 - 10.5 K/uL 7.8  2.9  2.9   Hemoglobin 13.0 - 17.0 g/dL 30.8  65.7  84.6   Hematocrit 39.0 - 52.0 % 35.5  34.6  34.1   Platelets 150 - 400 K/uL 196  113  123   .    Latest Ref Rng & Units 04/21/2023   10:26 AM 04/07/2023   11:30 AM 03/24/2023   10:10 AM  CMP  Glucose 70 - 99 mg/dL 962  952  98   BUN 6 - 20 mg/dL 11  9  10    Creatinine 0.61 - 1.24 mg/dL 8.41  3.24  4.01   Sodium 135 - 145 mmol/L 140  140  141   Potassium 3.5 - 5.1 mmol/L 3.9  3.5  3.4   Chloride 98 - 111 mmol/L 108  108  109   CO2 22 - 32 mmol/L 27  27  27    Calcium 8.9 - 10.3 mg/dL 9.2  9.1  9.1   Total Protein 6.5 - 8.1 g/dL 7.7  7.3  6.8   Total Bilirubin 0.0 - 1.2 mg/dL 0.9  0.9  0.8   Alkaline Phos 38 - 126 U/L 419  218  72   AST 15 - 41 U/L 149  234  18   ALT 0 - 44 U/L 303  447  13     Cytology done 07/13/2021 revealed "FINAL MICROSCOPIC DIAGNOSIS:  A. PANCREAS, HEAD, FINE NEEDLE ASPIRATION:  - Malignant cells present  - Poorly differentiated/high-grade neuroendocrine carcinoma (see  comment)   B. CBD STRICTURE, BRUSHING:  - Atypical cells suspicious for tumor   C. BILIARY DILATION, BALLOON, REMOVAL:  - Atypical cells suspicious for tumor "  Surgical pathology done 07/13/2021 revealed "FINAL MICROSCOPIC DIAGNOSIS:   A. STOMACH, BIOPSY:  Reactive gastropathy and minimal chronic gastritis with lymphoid  aggregate  Negative for H. pylori, intestinal metaplasia, dysplasia and carcinoma"   Pathology 11/07/21: FINAL MICROSCOPIC DIAGNOSIS:   A. GALLBLADDER, CHOLECYSTECTOMY:  - Acute cholecystitis with necrosis and abscess.   RADIOGRAPHIC STUDIES: I have personally reviewed the radiological images as listed and agreed with the findings in the report. No results found.    ASSESSMENT & PLAN:   33 y.o. very pleasant male with  1. Poorly differentiated/high grade neuroendocrine  carcinoma - likely Stage IV metastatic to loco regional LNs and likely liver mets. -Cytology done 07/13/2021 shows presence of malignant cells in pancreas and poorly  differentiated/high grade neuroendocrine carcinoma.  2.  Recent hospitalization from pneumonia likely related to mycoplasma.  Respiratory viral panel showed rhinovirus/enterovirus.  3.  Hyperpigmentation from 5-FU chemotherapy  4.  Financial and work-related stressors.  PLAN: -Discussed lab results from today, 04/21/2023, in detail with the patient.  CBC  shows slightly low Hgb of 11.8 g.dL with Hct of 40.9%. CMP shows elevated Alkaline phosphate level of 419, elevated ut improved AST level of 149, and elevated but improved ALT of 303.  -CT chest abdomen pelvis results from 04/11/2023 pending.  - patient appropriate to proceed with planned treatment. -Labs does show elevated but improved LFTs since last visit. We will proceed with treatment with dose modification.  -Continue FOLFIRI treatment. Irinotecan dose will be reduced to 125 mg/m^2.  -Answered all of patient's questions.   FOLLOW-UP: Per integrated scheduling  The total time spent in the appointment was 30 minutes* .  All of the patient's questions were answered with apparent satisfaction. The patient knows to call the clinic with any problems, questions or concerns.   Wyvonnia Lora MD MS AAHIVMS [facility] Hematology/Oncology Physician [facility]  .*Total Encounter Time as defined by the Centers for Medicare and Medicaid Services includes, in addition to the face-to-face time of a patient visit (documented in the note above) non-face-to-face time: obtaining and reviewing outside history, ordering and reviewing medications, tests or procedures, care coordination (communications with other health care professionals or caregivers) and documentation in the medical record.   I,Param Shah,acting as a Neurosurgeon for Wyvonnia Lora, MD.,have documented all relevant  documentation on the behalf of Wyvonnia Lora, MD,as directed by  Wyvonnia Lora, MD while in the presence of Wyvonnia Lora, MD.  .I have reviewed the above documentation for accuracy and completeness, and I agree with the above. [name] MD    CT/ CHEST/ABD/PELVIS : 04/11/2023  IMPRESSION:  IMPRESSION: 1. Stable pancreatic head/uncinate process mass, compatible with the provided history of neuroendocrine tumor, with stable hepatic and locoregional nodal metastatic disease. 2. 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 3. Increasing peribronchovascular ground-glass nodularity in the lungs bilaterally may be due to an infectious bronchiolitis/bronchopneumonia. Organizing pneumonia is another consideration. 4. Trace right pleural fluid.     Electronically Signed   By: Leanna Battles M.D.   On: 04/21/2023 10:51   Electronically Signed   By: Jasmine Pang M.D.   On: 01/20/2023 15:33   -- No overt symptoms of pneumonia or respiratory infection at this time.Resolving previous pneumonia. Elevated LFTs likely from CTX -- reduced Irinotecan dose

## 2023-04-21 NOTE — Patient Instructions (Signed)
 [facility]  Discharge Instructions: Thank you for choosing [facility] to provide your oncology and hematology care.   If you have a lab appointment with the Cancer Center, please go directly to the Cancer Center and check in at the registration area.   Wear comfortable clothing and clothing appropriate for easy access to any Portacath or PICC line.   We strive to give you quality time with your provider. You may need to reschedule your appointment if you arrive late (15 or more minutes).  Arriving late affects you and other patients whose appointments are after yours.  Also, if you miss three or more appointments without notifying the office, you may be dismissed from the clinic at the provider's discretion.      For prescription refill requests, have your pharmacy contact our office and allow 72 hours for refills to be completed.    Today you received the following chemotherapy and/or immunotherapy agents fluorourcil, leucovorin, irinotecan      To help prevent nausea and vomiting after your treatment, we encourage you to take your nausea medication as directed.  BELOW ARE SYMPTOMS THAT SHOULD BE REPORTED IMMEDIATELY: *FEVER GREATER THAN 100.4 F (38 C) OR HIGHER *CHILLS OR SWEATING *NAUSEA AND VOMITING THAT IS NOT CONTROLLED WITH YOUR NAUSEA MEDICATION *UNUSUAL SHORTNESS OF BREATH *UNUSUAL BRUISING OR BLEEDING *URINARY PROBLEMS (pain or burning when urinating, or frequent urination) *BOWEL PROBLEMS (unusual diarrhea, constipation, pain near the anus) TENDERNESS IN MOUTH AND THROAT WITH OR WITHOUT PRESENCE OF ULCERS (sore throat, sores in mouth, or a toothache) UNUSUAL RASH, SWELLING OR PAIN  UNUSUAL VAGINAL DISCHARGE OR ITCHING   Items with * indicate a potential emergency and should be followed up as soon as possible or go to the Emergency Department if any problems should occur.  Please show the CHEMOTHERAPY  ALERT CARD or IMMUNOTHERAPY ALERT CARD at check-in to the Emergency Department and triage nurse.  Should you have questions after your visit or need to cancel or reschedule your appointment, please contact [facility]  Dept: [phone]  and follow the prompts.  Office hours are 8:00 a.m. to 4:30 p.m. Monday - Friday. Please note that voicemails left after 4:00 p.m. may not be returned until the following business day.  We are closed weekends and major holidays. You have access to a nurse at all times for urgent questions. Please call the main number to the clinic Dept: [phone] and follow the prompts.   For any non-urgent questions, you may also contact your provider using MyChart. We now offer e-Visits for anyone 31 and older to request care online for non-urgent symptoms. For details visit [email or web address].   Also download the MyChart app! Go to the app store, search "MyChart", open the app, select [facility], and log in with your MyChart username and password.

## 2023-04-23 ENCOUNTER — Inpatient Hospital Stay: Payer: 59

## 2023-04-23 VITALS — BP 116/82 | HR 89 | Temp 98.7°F | Resp 16

## 2023-04-23 DIAGNOSIS — C7A1 Malignant poorly differentiated neuroendocrine tumors: Secondary | ICD-10-CM

## 2023-04-23 DIAGNOSIS — Z5111 Encounter for antineoplastic chemotherapy: Secondary | ICD-10-CM | POA: Diagnosis not present

## 2023-04-23 DIAGNOSIS — Z7189 Other specified counseling: Secondary | ICD-10-CM

## 2023-04-23 MED ORDER — HEPARIN SOD (PORK) LOCK FLUSH 100 UNIT/ML IV SOLN
500.0000 [IU] | Freq: Once | INTRAVENOUS | Status: AC | PRN
Start: 2023-04-23 — End: 2023-04-23
  Administered 2023-04-23: 500 [IU]

## 2023-04-23 MED ORDER — SODIUM CHLORIDE 0.9% FLUSH
10.0000 mL | INTRAVENOUS | Status: DC | PRN
  Administered 2023-04-23: 10 mL

## 2023-04-28 ENCOUNTER — Encounter: Payer: Self-pay | Admitting: Hematology

## 2023-05-01 ENCOUNTER — Encounter: Payer: Self-pay | Admitting: Hematology

## 2023-05-05 ENCOUNTER — Inpatient Hospital Stay (HOSPITAL_BASED_OUTPATIENT_CLINIC_OR_DEPARTMENT_OTHER): Payer: 59 | Admitting: Hematology

## 2023-05-05 ENCOUNTER — Inpatient Hospital Stay: Payer: 59

## 2023-05-05 VITALS — BP 127/88 | HR 77 | Temp 98.1°F | Resp 18 | Ht 72.0 in | Wt 303.7 lb

## 2023-05-05 DIAGNOSIS — Z5111 Encounter for antineoplastic chemotherapy: Secondary | ICD-10-CM | POA: Diagnosis not present

## 2023-05-05 DIAGNOSIS — Z7189 Other specified counseling: Secondary | ICD-10-CM

## 2023-05-05 DIAGNOSIS — C7A1 Malignant poorly differentiated neuroendocrine tumors: Secondary | ICD-10-CM | POA: Diagnosis not present

## 2023-05-05 DIAGNOSIS — Z95828 Presence of other vascular implants and grafts: Secondary | ICD-10-CM

## 2023-05-05 LAB — CMP (CANCER CENTER ONLY)
ALT: 209 U/L — ABNORMAL HIGH (ref 0–44)
AST: 268 U/L (ref 15–41)
Albumin: 4.3 g/dL (ref 3.5–5.0)
Alkaline Phosphatase: 292 U/L — ABNORMAL HIGH (ref 38–126)
Anion gap: 5 (ref 5–15)
BUN: 8 mg/dL (ref 6–20)
CO2: 27 mmol/L (ref 22–32)
Calcium: 9.4 mg/dL (ref 8.9–10.3)
Chloride: 107 mmol/L (ref 98–111)
Creatinine: 0.76 mg/dL (ref 0.61–1.24)
GFR, Estimated: >60 mL/min (ref >60)
Glucose, Bld: 95 mg/dL (ref 70–99)
Potassium: 3.7 mmol/L (ref 3.5–5.1)
Sodium: 139 mmol/L (ref 135–145)
Total Bilirubin: 1.1 mg/dL (ref 0.0–1.2)
Total Protein: 7.5 g/dL (ref 6.5–8.1)

## 2023-05-05 LAB — CBC WITH DIFFERENTIAL (CANCER CENTER ONLY)
Abs Immature Granulocytes: 0.01 10*3/uL (ref 0.00–0.07)
Basophils Absolute: 0 10*3/uL (ref 0.0–0.1)
Basophils Relative: 0 %
Eosinophils Absolute: 0.3 10*3/uL (ref 0.0–0.5)
Eosinophils Relative: 10 %
HCT: 34.6 % — ABNORMAL LOW (ref 39.0–52.0)
Hemoglobin: 11.2 g/dL — ABNORMAL LOW (ref 13.0–17.0)
Immature Granulocytes: 0 %
Lymphocytes Relative: 19 %
Lymphs Abs: 0.5 10*3/uL — ABNORMAL LOW (ref 0.7–4.0)
MCH: 27.9 pg (ref 26.0–34.0)
MCHC: 32.4 g/dL (ref 30.0–36.0)
MCV: 86.3 fL (ref 80.0–100.0)
Monocytes Absolute: 0.3 10*3/uL (ref 0.1–1.0)
Monocytes Relative: 13 %
Neutro Abs: 1.4 10*3/uL — ABNORMAL LOW (ref 1.7–7.7)
Neutrophils Relative %: 58 %
Platelet Count: 141 10*3/uL — ABNORMAL LOW (ref 150–400)
RBC: 4.01 MIL/uL — ABNORMAL LOW (ref 4.22–5.81)
RDW: 15.8 % — ABNORMAL HIGH (ref 11.5–15.5)
WBC Count: 2.5 10*3/uL — ABNORMAL LOW (ref 4.0–10.5)
nRBC: 0 % (ref 0.0–0.2)

## 2023-05-05 MED ORDER — PALONOSETRON HCL INJECTION 0.25 MG/5ML
0.2500 mg | Freq: Once | INTRAVENOUS | Status: AC
  Administered 2023-05-05: 0.25 mg via INTRAVENOUS
  Filled 2023-05-05: qty 5

## 2023-05-05 MED ORDER — DEXAMETHASONE SODIUM PHOSPHATE 10 MG/ML IJ SOLN
10.0000 mg | Freq: Once | INTRAMUSCULAR | Status: AC
Start: 2023-05-05 — End: 2023-05-05
  Administered 2023-05-05: 10 mg via INTRAVENOUS
  Filled 2023-05-05: qty 1

## 2023-05-05 MED ORDER — FLUOROURACIL CHEMO INJECTION 2.5 GM/50ML
400.0000 mg/m2 | Freq: Once | INTRAVENOUS | Status: AC
  Administered 2023-05-05: 1000 mg via INTRAVENOUS
  Filled 2023-05-05: qty 20

## 2023-05-05 MED ORDER — SODIUM CHLORIDE 0.9 % IV SOLN: Freq: Once | INTRAVENOUS | Status: AC

## 2023-05-05 MED ORDER — SODIUM CHLORIDE 0.9 % IV SOLN
125.0000 mg/m2 | Freq: Once | INTRAVENOUS | Status: AC
  Administered 2023-05-05: 300 mg via INTRAVENOUS
  Filled 2023-05-05: qty 15

## 2023-05-05 MED ORDER — SODIUM CHLORIDE 0.9 % IV SOLN
400.0000 mg/m2 | Freq: Once | INTRAVENOUS | Status: AC
  Administered 2023-05-05: 1020 mg via INTRAVENOUS
  Filled 2023-05-05: qty 50

## 2023-05-05 MED ORDER — ATROPINE SULFATE 1 MG/ML IV SOLN
0.5000 mg | Freq: Once | INTRAVENOUS | Status: AC | PRN
  Administered 2023-05-05: 0.5 mg via INTRAVENOUS
  Filled 2023-05-05: qty 1

## 2023-05-05 MED ORDER — SODIUM CHLORIDE 0.9% FLUSH
10.0000 mL | Freq: Once | INTRAVENOUS | Status: AC
  Administered 2023-05-05: 10 mL

## 2023-05-05 MED ORDER — SODIUM CHLORIDE 0.9 % IV SOLN
2400.0000 mg/m2 | INTRAVENOUS | Status: DC
  Administered 2023-05-05: 6100 mg via INTRAVENOUS
  Filled 2023-05-05: qty 122

## 2023-05-05 NOTE — Progress Notes (Signed)
 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   HISTORY OF PRESENTING ILLNESS:  Please see previous note for details on initial presentation  INTERVAL HISTORY:  Leon Fischer. Is a 33 y.o. 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 He is here for cycle 17 day 1 of his Folfiri treatment.   Patient was last seen by me on 04/21/2023 and he was doing well overall.   Patient is accompanied by his mother during this visit. Patient notes he has been doing well overall since our last visit. He denies any new infection issues, fever, chills, night sweats, unexpected weight loss, back pain, chest pain, abdominal pain, or leg swelling.   He tolerated his previous cycle of his treatment well without any new or severe toxicities. He denies mouth sores, diarrhea, or other toxicities.    MEDICAL HISTORY:  Past Medical History:  Diagnosis Date   Asthma    Neuroendocrine cancer (HCC)     SURGICAL HISTORY: Past Surgical History:  Procedure Laterality Date   BILIARY DILATION  07/13/2021   Procedure: BILIARY DILATION;  Surgeon: Vida Rigger, MD;  Location: [facility];  Service: Gastroenterology;;   BILIARY STENT PLACEMENT N/A 07/13/2021   Procedure: BILIARY STENT PLACEMENT;  Surgeon: Vida Rigger, MD;  Location: [name];  Service: Gastroenterology;  Laterality: N/A;   BILIARY STENT PLACEMENT N/A 11/05/2021   Procedure: BILIARY STENT PLACEMENT;  Surgeon: Vida Rigger, MD;  Location: [name];  Service: Gastroenterology;  Laterality: N/A;   BIOPSY  07/13/2021   Procedure: BIOPSY;  Surgeon: Meridee Score Netty Starring., MD;  Location: [facility];  Service: Gastroenterology;;   CHOLECYSTECTOMY N/A 11/07/2021   Procedure: LAPAROSCOPIC CHOLECYSTECTOMY;  Surgeon: Berna Bue, MD;  Location: [facility];  Service: General;  Laterality: N/A;   ENDOSCOPIC RETROGRADE CHOLANGIOPANCREATOGRAPHY (ERCP) WITH PROPOFOL N/A 07/13/2021   Procedure: ENDOSCOPIC RETROGRADE CHOLANGIOPANCREATOGRAPHY (ERCP) WITH PROPOFOL;  Surgeon: Vida Rigger, MD;  Location: [name];  Service: Gastroenterology;  Laterality: N/A;   ERCP N/A 11/05/2021   Procedure: ENDOSCOPIC RETROGRADE CHOLANGIOPANCREATOGRAPHY (ERCP);  Surgeon: Vida Rigger, MD;  Location: [facility];  Service: Gastroenterology;  Laterality: N/A;   ESOPHAGOGASTRODUODENOSCOPY (EGD) WITH PROPOFOL N/A 07/13/2021   Procedure: ESOPHAGOGASTRODUODENOSCOPY (EGD) WITH PROPOFOL;  Surgeon: Meridee Score Netty Starring., MD;  Location: [name];  Service: Gastroenterology;  Laterality: N/A;   EUS N/A 07/13/2021   Procedure: UPPER ENDOSCOPIC ULTRASOUND (EUS) LINEAR;  Surgeon: Lemar Lofty., MD;  Location: [name];  Service: Gastroenterology;  Laterality: N/A;   FINE NEEDLE ASPIRATION  07/13/2021   Procedure: FINE NEEDLE ASPIRATION (FNA) LINEAR;  Surgeon: Meridee Score Netty Starring., MD;  Location: [facility];  Service: Gastroenterology;;   IR IMAGING GUIDED PORT INSERTION  07/31/2021   SPHINCTEROTOMY  07/13/2021   Procedure: Dennison Mascot;  Surgeon: Vida Rigger, MD;  Location: [name];  Service: Gastroenterology;;    SOCIAL HISTORY: Social History   Socioeconomic History   Marital status: Single    Spouse name: Not on file   Number of children: 0   Years of education: Not on file   Highest education level: Some college, no degree  Occupational History   Not on file  Tobacco Use   Smoking status: Some Days    Types: Cigarettes  Smokeless tobacco: Never  Vaping Use   Vaping status: Never Used  Substance and Sexual Activity   Alcohol use: Yes    Comment: social    Drug use: No   Sexual activity: Never  Other Topics Concern   Not on file  Social History Narrative   Not on file   Social Drivers of Health   Financial Resource Strain: Low Risk  (04/14/2017)   Overall Financial Resource Strain (CARDIA)    Difficulty of Paying Living Expenses: Not hard at all  Food Insecurity: No Food Insecurity (01/21/2023)   Hunger Vital Sign    Worried About Running Out of Food in the Last Year: Never true    Ran Out of Food in the Last Year: Never true  Transportation Needs: No Transportation Needs (01/21/2023)   PRAPARE - Administrator, Civil Service (Medical): No    Lack of Transportation (Non-Medical): No  Physical Activity: Inactive (04/14/2017)   Exercise Vital Sign    Days of Exercise per Week: 0 days    Minutes of Exercise per Session: 0 min  Stress: Stress Concern Present (04/14/2017)   [name] of Occupational Health - Occupational Stress Questionnaire    Feeling of Stress : Rather much  Social Connections: Unknown (06/19/2021)   [facility]   Social Network    Social Network: Not on file  Intimate Partner Violence: Not At Risk (01/21/2023)   Humiliation, Afraid, Rape, and Kick questionnaire    Fear of Current or Ex-Partner: No    Emotionally Abused: No    Physically Abused: No    Sexually Abused: No    FAMILY HISTORY: Family History  Problem Relation Age of Onset   Drug abuse Father    ALLERGIES:  is allergic to shellfish allergy.  MEDICATIONS:  Current Outpatient Medications  Medication Sig Dispense Refill   acetaminophen (TYLENOL) 500 MG tablet Take 1,000 mg by mouth every 6 (six) hours as needed for mild pain.     albuterol (VENTOLIN HFA) 108 (90 Base) MCG/ACT inhaler Inhale 1-2 puffs into the lungs every 6 (six) hours as needed for wheezing or shortness of breath. 18 g 0   dexamethasone (DECADRON) 4 MG tablet Take 2 tablets (8 mg total) by mouth daily. Start the day after  chemotherapy for 2 days. Take with food. 8 tablet 5   lidocaine (LIDODERM) 5 % Place 1 patch onto the skin daily. Remove & Discard patch within 12 hours or as directed by MD 30 patch 0   methocarbamol (ROBAXIN) 500 MG tablet Take 1 tablet (500 mg total) by mouth 2 (two) times daily. 20 tablet 0   ondansetron (ZOFRAN) 8 MG tablet Take 1 tablet (8 mg total) by mouth every 8 (eight) hours as needed for nausea, vomiting or refractory nausea / vomiting. Start on the third day after chemotherapy. 30 tablet 1   predniSONE (DELTASONE) 10 MG tablet Take 3 tabs (30 mg total) daily x 2 days, then 2 tabs (20 mg total) daily x 2 days, then 1 tab (10 mg total) daily x 2 days, then stop. 12 tablet 0   prochlorperazine (COMPAZINE) 10 MG tablet Take 1 tablet (10 mg total) by mouth every 6 (six) hours as needed for nausea or vomiting. 30 tablet 1   No current facility-administered medications for this visit.    REVIEW OF SYSTEMS:    10 Point review of Systems was done is negative except as noted above.   PHYSICAL EXAMINATION:  ECOG PERFORMANCE STATUS: 1 - Symptomatic but completely ambulatory .BP 127/88 (BP Location: Left Arm, Patient Position: Sitting)   Pulse 77   Temp 98.1 F (36.7 C) (Tympanic)   Resp 18   Ht 6' (1.829 m)   Wt (!) 303 lb 11.2 oz (137.8 kg)   SpO2 99%   BMI 41.19 kg/m   GENERAL:alert, in no acute distress and comfortable SKIN: no acute rashes, no significant lesions EYES: conjunctiva are pink and non-injected, sclera anicteric OROPHARYNX: MMM, no exudates, no oropharyngeal erythema or ulceration NECK: supple, no JVD LYMPH:  no palpable lymphadenopathy in the cervical, axillary or inguinal regions LUNGS: clear to auscultation b/l with normal respiratory effort HEART: regular rate & rhythm ABDOMEN:  normoactive bowel sounds , non tender, not distended. Extremity: no pedal edema PSYCH: alert & oriented x 3 with fluent speech NEURO: no focal motor/sensory deficits  LABORATORY  DATA:  I have reviewed the data as listed    Latest Ref Rng & Units 05/05/2023   10:42 AM 04/21/2023   10:26 AM 04/07/2023   11:30 AM  CBC  WBC 4.0 - 10.5 K/uL 2.5  7.8  2.9   Hemoglobin 13.0 - 17.0 g/dL 09.8  11.9  14.7   Hematocrit 39.0 - 52.0 % 34.6  35.5  34.6   Platelets 150 - 400 K/uL 141  196  113   .    Latest Ref Rng & Units 05/05/2023   10:42 AM 04/21/2023   10:26 AM 04/07/2023   11:30 AM  CMP  Glucose 70 - 99 mg/dL 95  829  562   BUN 6 - 20 mg/dL 8  11  9    Creatinine 0.61 - 1.24 mg/dL 1.30  8.65  7.84   Sodium 135 - 145 mmol/L 139  140  140   Potassium 3.5 - 5.1 mmol/L 3.7  3.9  3.5   Chloride 98 - 111 mmol/L 107  108  108   CO2 22 - 32 mmol/L 27  27  27    Calcium 8.9 - 10.3 mg/dL 9.4  9.2  9.1   Total Protein 6.5 - 8.1 g/dL 7.5  7.7  7.3   Total Bilirubin 0.0 - 1.2 mg/dL 1.1  0.9  0.9   Alkaline Phos 38 - 126 U/L 292  419  218   AST 15 - 41 U/L 268  149  234   ALT 0 - 44 U/L 209  303  447     Cytology done 07/13/2021 revealed "FINAL MICROSCOPIC DIAGNOSIS:  A. PANCREAS, HEAD, FINE NEEDLE ASPIRATION:  - Malignant cells present  - Poorly differentiated/high-grade neuroendocrine carcinoma (see  comment)   B. CBD STRICTURE, BRUSHING:  - Atypical cells suspicious for tumor   C. BILIARY DILATION, BALLOON, REMOVAL:  - Atypical cells suspicious for tumor "  Surgical pathology done 07/13/2021 revealed "FINAL MICROSCOPIC DIAGNOSIS:   A. STOMACH, BIOPSY:  Reactive gastropathy and minimal chronic gastritis with lymphoid  aggregate  Negative for H. pylori, intestinal metaplasia, dysplasia and carcinoma"   Pathology 11/07/21: FINAL MICROSCOPIC DIAGNOSIS:   A. GALLBLADDER, CHOLECYSTECTOMY:  - Acute cholecystitis with necrosis and abscess.   RADIOGRAPHIC STUDIES: I have personally reviewed the radiological images as listed and agreed with the findings in the report. CT CHEST ABDOMEN PELVIS W CONTRAST Result Date: 04/21/2023 CLINICAL DATA:  Assessment of response to  treatment for metastatic high-grade neuroendocrine tumor. Abnormal liver function tests. * Tracking Code: BO * EXAM: CT CHEST, ABDOMEN, AND PELVIS WITH CONTRAST TECHNIQUE: Multidetector CT imaging  of the chest, abdomen and pelvis was performed following the standard protocol during bolus administration of intravenous contrast. RADIATION DOSE REDUCTION: This exam was performed according to the departmental dose-optimization program which includes automated exposure control, adjustment of the mA and/or kV according to patient size and/or use of iterative reconstruction technique. CONTRAST:  OMNIPAQUE IOHEXOL 300 MG/ML  SOLN COMPARISON:  01/17/2023. FINDINGS: CT CHEST FINDINGS Cardiovascular: Right IJ Port-A-Cath terminates in the right atrium. Heart is at the upper limits of normal in size. No pericardial effusion. Mediastinum/Nodes: No pathologically enlarged mediastinal, hilar or axillary lymph nodes. Esophagus is grossly unremarkable. Lungs/Pleura: Increasing peribronchovascular ground-glass nodularity bilaterally. No solid pulmonary nodules. Trace right pleural fluid. Airway is unremarkable. Musculoskeletal: Degenerative changes in the spine. No worrisome lytic or sclerotic lesions. CT ABDOMEN PELVIS FINDINGS Hepatobiliary: Mildly hypodense lesions in the liver appears similar with index lesion in segment IV measuring 1.7 x 1.8 cm (4/47). Cholecystectomy. Lower common bile duct wall stent extends into the duodenum. No biliary ductal dilatation. Pancreas: Similar fullness in the pancreatic head with an uncinate process mass measuring approximately 3.4 x 4.9 cm, stable. Associated pancreatic ductal dilatation and gland atrophy. Spleen: Negative. Adrenals/Urinary Tract: Adrenal glands and kidneys are unremarkable. Ureters are decompressed. Bladder is grossly unremarkable. Stomach/Bowel: Stomach, small bowel, appendix and colon are unremarkable. Vascular/Lymphatic: Numerous upper abdominal and gastroesophageal  varices. Portal and superior mesenteric veins appear attenuated near the splenic portal confluence, as before. Abdominal retroperitoneal lymph nodes measure up to 1.5 cm in the aortocaval station (4/63), stable. Reproductive: Prostate is visualized. Other: No free fluid. Small bowel mesenteric lymph nodes measure up to 12 mm in the right mid abdomen (4/65). Musculoskeletal: None. IMPRESSION: 1. Stable pancreatic head/uncinate process mass, compatible with the provided history of neuroendocrine tumor, with stable hepatic and locoregional nodal metastatic disease. 2. 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 3. Increasing peribronchovascular ground-glass nodularity in the lungs bilaterally may be due to an infectious bronchiolitis/bronchopneumonia. Organizing pneumonia is another consideration. 4. Trace right pleural fluid. Electronically Signed   By: Leanna Battles M.D.   On: 04/21/2023 10:51      ASSESSMENT & PLAN:   33 y.o. very pleasant male with  1. 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 -Cytology done 07/13/2021 shows presence of malignant cells in pancreas and poorly differentiated/high grade neuroendocrine carcinoma.  2.  Recent hospitalization from pneumonia likely related to mycoplasma.  Respiratory viral panel showed rhinovirus/enterovirus.  3.  Hyperpigmentation from 5-FU chemotherapy  4.  Financial and work-related stressors.  PLAN: -Discussed lab results from today, 05/05/2023, in detail with the patient. CBC shows low WBC of 2.5 K, low Hgb of 11.2 g/dL with low Hct of 28.4%, and low platelets of 141 K. CMP reviewed. Still with abnormal lfts but improving. -Patient denies [facility] referral for now.  -Discussed CT Chest/abdomen/pelvis results from 04/11/2023 in detail with the patient. Showed 1. Stable  pancreatic head/uncinate process mass, compatible with the provided history of neuroendocrine tumor, with stable hepatic and locoregional nodal metastatic disease. 2. 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 3. Increasing peribronchovascular ground-glass nodularity in the lungs bilaterally may be due to an infectious bronchiolitis/bronchopneumonia. Organizing pneumonia is another consideration. 4. Trace right pleural fluid. -Patient tolerated his previous cycle of his treatment well without any new or severe  toxicities.  - patient appropriate to proceed with planned treatment. -Continue FOLFIRI treatment. Irinotecan dose will be the same at 125 mg/m^2.  -Answered all of patient's questions.   FOLLOW-UP: Per integrated scheduling  The total time spent in the appointment was 30 minutes* .  All of the patient's questions were answered with apparent satisfaction. The patient knows to call the clinic with any problems, questions or concerns.   Wyvonnia Lora MD MS AAHIVMS [facility] Hematology/Oncology Physician [facility] [place]  .*Total Encounter Time as defined by the Centers for Medicare and Medicaid Services includes, in addition to the face-to-face time of a patient visit (documented in the note above) non-face-to-face time: obtaining and reviewing outside history, ordering and reviewing medications, tests or procedures, care coordination (communications with other health care professionals or caregivers) and documentation in the medical record.   I,Param Shah,acting as a Neurosurgeon for Wyvonnia Lora, MD.,have documented all relevant documentation on the behalf of Wyvonnia Lora, MD,as directed by  Wyvonnia Lora, MD while in the presence of Wyvonnia Lora, MD.  .I have reviewed the above documentation for accuracy and completeness, and I agree with the above. Leon Maine MD

## 2023-05-05 NOTE — Patient Instructions (Signed)
 [facility] - A DEPT OF [facility]  Discharge Instructions: Thank you for choosing [facility] to provide your oncology and hematology care.   If you have a lab appointment with the Cancer Center, please go directly to the Cancer Center and check in at the registration area.   Wear comfortable clothing and clothing appropriate for easy access to any Portacath or PICC line.   We strive to give you quality time with your provider. You may need to reschedule your appointment if you arrive late (15 or more minutes).  Arriving late affects you and other patients whose appointments are after yours.  Also, if you miss three or more appointments without notifying the office, you may be dismissed from the clinic at the provider's discretion.      For prescription refill requests, have your pharmacy contact our office and allow 72 hours for refills to be completed.    Today you received the following chemotherapy and/or immunotherapy agents Irinotecan, Leukovorin, 5FU      To help prevent nausea and vomiting after your treatment, we encourage you to take your nausea medication as directed.  BELOW ARE SYMPTOMS THAT SHOULD BE REPORTED IMMEDIATELY: *FEVER GREATER THAN 100.4 F (38 C) OR HIGHER *CHILLS OR SWEATING *NAUSEA AND VOMITING THAT IS NOT CONTROLLED WITH YOUR NAUSEA MEDICATION *UNUSUAL SHORTNESS OF BREATH *UNUSUAL BRUISING OR BLEEDING *URINARY PROBLEMS (pain or burning when urinating, or frequent urination) *BOWEL PROBLEMS (unusual diarrhea, constipation, pain near the anus) TENDERNESS IN MOUTH AND THROAT WITH OR WITHOUT PRESENCE OF ULCERS (sore throat, sores in mouth, or a toothache) UNUSUAL RASH, SWELLING OR PAIN  UNUSUAL VAGINAL DISCHARGE OR ITCHING   Items with * indicate a potential emergency and should be followed up as soon as possible or go to the Emergency Department if any problems should occur.  Please show the CHEMOTHERAPY ALERT CARD  or IMMUNOTHERAPY ALERT CARD at check-in to the Emergency Department and triage nurse.  Should you have questions after your visit or need to cancel or reschedule your appointment, please contact [facility] - A DE[facility]  Dept: [phone]  and follow the prompts.  Office hours are 8:00 a.m. to 4:30 p.m. Monday - Friday. Please note that voicemails left after 4:00 p.m. may not be returned until the following business day.  We are closed weekends and major holidays. You have access to a nurse at all times for urgent questions. Please call the main number to the clinic Dept: [phone] and follow the prompts.   For any non-urgent questions, you may also contact your provider using MyChart. We now offer e-Visits for anyone 39 and older to request care online for non-urgent symptoms. For details visit [email or web address].   Also download the MyChart app! Go to the app store, search "MyChart", open the app, select [facility], and log in with your MyChart username and password.

## 2023-05-05 NOTE — Progress Notes (Signed)
 Patient seen by Dr. Addison Naegeli are within treatment parameters.  Labs reviewed: and are not all within treatment parameters.   Dr Candise Che aware ANC: 1.4, AST: 268, ALT: 209 Per physician team, patient is ready for treatment and there are NO modifications to the treatment plan.

## 2023-05-06 ENCOUNTER — Encounter: Payer: Self-pay | Admitting: Hematology

## 2023-05-06 ENCOUNTER — Other Ambulatory Visit: Payer: Self-pay

## 2023-05-07 ENCOUNTER — Inpatient Hospital Stay: Payer: 59 | Attending: Hematology

## 2023-05-07 VITALS — BP 127/88 | HR 74 | Temp 98.4°F | Resp 18

## 2023-05-07 DIAGNOSIS — F17210 Nicotine dependence, cigarettes, uncomplicated: Secondary | ICD-10-CM | POA: Insufficient documentation

## 2023-05-07 DIAGNOSIS — C7A1 Malignant poorly differentiated neuroendocrine tumors: Secondary | ICD-10-CM | POA: Diagnosis present

## 2023-05-07 DIAGNOSIS — Z95828 Presence of other vascular implants and grafts: Secondary | ICD-10-CM

## 2023-05-07 MED ORDER — SODIUM CHLORIDE 0.9% FLUSH
10.0000 mL | Freq: Once | INTRAVENOUS | Status: AC
  Administered 2023-05-07: 10 mL

## 2023-05-07 MED ORDER — HEPARIN SOD (PORK) LOCK FLUSH 100 UNIT/ML IV SOLN
500.0000 [IU] | Freq: Once | INTRAVENOUS | Status: AC
  Administered 2023-05-07: 500 [IU]

## 2023-05-11 ENCOUNTER — Encounter: Payer: Self-pay | Admitting: Hematology

## 2023-05-15 NOTE — Progress Notes (Signed)
 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   HISTORY OF PRESENTING ILLNESS:  Please see previous note for details on initial presentation  INTERVAL HISTORY:  Leon Fischer. Is a 33 y.o. 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 Patien tis scheduled for cycle 19 day 1 of his Folfiri treatment on 05/19/2023.   Patient was last seen by me on 05/05/2023 and was doing well overall with no medical complaints.   Today, he is accompanied by his mother. Patient reports no new concerns since his last clinical visit.   Patient has been eating well and his weight has been stable. Patient denies any concern for fever, chills, night sweats, abdominal pain, SOB, uncontrolled nausea, vomiting, diarrhea, skin rashes, leg swelling, or other new concerns.   MEDICAL HISTORY:  Past Medical History:  Diagnosis Date   Asthma    Neuroendocrine cancer (HCC)     SURGICAL HISTORY: Past Surgical History:  Procedure Laterality Date   BILIARY DILATION  07/13/2021   Procedure: BILIARY DILATION;  Surgeon: Ozell Blunt, MD;  Location: [facility];  Service: Gastroenterology;;   BILIARY STENT PLACEMENT N/A 07/13/2021   Procedure: BILIARY STENT PLACEMENT;  Surgeon: Ozell Blunt, MD;  Location: [name];  Service: Gastroenterology;  Laterality: N/A;   BILIARY STENT PLACEMENT N/A 11/05/2021   Procedure: BILIARY STENT PLACEMENT;  Surgeon: Ozell Blunt, MD;  Location: [name];  Service: Gastroenterology;  Laterality: N/A;   BIOPSY  07/13/2021   Procedure: BIOPSY;  Surgeon: Brice Campi Albino Alu., MD;  Location: [facility];   Service: Gastroenterology;;   CHOLECYSTECTOMY N/A 11/07/2021   Procedure: LAPAROSCOPIC CHOLECYSTECTOMY;  Surgeon: Adalberto Acton, MD;  Location: [facility];  Service: General;  Laterality: N/A;   ENDOSCOPIC RETROGRADE CHOLANGIOPANCREATOGRAPHY (ERCP) WITH PROPOFOL N/A 07/13/2021   Procedure: ENDOSCOPIC RETROGRADE CHOLANGIOPANCREATOGRAPHY (ERCP) WITH PROPOFOL;  Surgeon: Ozell Blunt, MD;  Location: [name];  Service: Gastroenterology;  Laterality: N/A;   ERCP N/A 11/05/2021   Procedure: ENDOSCOPIC RETROGRADE CHOLANGIOPANCREATOGRAPHY (ERCP);  Surgeon: Ozell Blunt, MD;  Location: [facility];  Service: Gastroenterology;  Laterality: N/A;   ESOPHAGOGASTRODUODENOSCOPY (EGD) WITH PROPOFOL N/A 07/13/2021   Procedure: ESOPHAGOGASTRODUODENOSCOPY (EGD) WITH PROPOFOL;  Surgeon: Brice Campi Albino Alu., MD;  Location: [name];  Service: Gastroenterology;  Laterality: N/A;   EUS N/A 07/13/2021   Procedure: UPPER ENDOSCOPIC ULTRASOUND (EUS) LINEAR;  Surgeon: Normie Becton., MD;  Location: [name];  Service: Gastroenterology;  Laterality: N/A;   FINE NEEDLE ASPIRATION  07/13/2021   Procedure: FINE NEEDLE ASPIRATION (FNA) LINEAR;  Surgeon: Brice Campi Albino Alu., MD;  Location: [facility];  Service: Gastroenterology;;   IR IMAGING GUIDED PORT INSERTION  07/31/2021   SPHINCTEROTOMY  07/13/2021   Procedure: [address];  Surgeon: Ozell Blunt, MD;  Location: [name];  Service: Gastroenterology;;    SOCIAL HISTORY: Social History   Socioeconomic History   Marital status: Single    Spouse name: Not on file   Number of children: 0   Years of education: Not on file   Highest education level: Some college, no degree  Occupational History   Not on file  Tobacco Use   Smoking status: Some Days    Types: Cigarettes   Smokeless tobacco: Never  Vaping Use   Vaping status:  Never Used  Substance and Sexual Activity   Alcohol use: Yes    Comment: social   Drug use: No   Sexual activity:  Never  Other Topics Concern   Not on file  Social History Narrative   Not on file   Social Drivers of Health   Financial Resource Strain: Low Risk  (04/14/2017)   Overall Financial Resource Strain (CARDIA)    Difficulty of Paying Living Expenses: Not hard at all  Food Insecurity: No Food Insecurity (01/21/2023)   Hunger Vital Sign    Worried About Running Out of Food in the Last Year: Never true    Ran Out of Food in the Last Year: Never true  Transportation Needs: No Transportation Needs (01/21/2023)   PRAPARE - Administrator, Civil Service (Medical): No    Lack of Transportation (Non-Medical): No  Physical Activity: Inactive (04/14/2017)   Exercise Vital Sign    Days of Exercise per Week: 0 days    Minutes of Exercise per Session: 0 min  Stress: Stress Concern Present (04/14/2017)   [name] of Occupational Health - Occupational Stress Questionnaire    Feeling of Stress : Rather much  Social Connections: Unknown (06/19/2021)   Received from [facility], [facility]   Social Network    Social Network: Not on file  Intimate Partner Violence: Not At Risk (01/21/2023)   Humiliation, Afraid, Rape, and Kick questionnaire    Fear of Current or Ex-Partner: No    Emotionally Abused: No    Physically Abused: No    Sexually Abused: No    FAMILY HISTORY: Family History  Problem Relation Age of Onset   Drug abuse Father    ALLERGIES:  is allergic to shellfish allergy.  MEDICATIONS:  Current Outpatient Medications  Medication Sig Dispense Refill   acetaminophen (TYLENOL) 500 MG tablet Take 1,000 mg by mouth every 6 (six) hours as needed for mild pain.     albuterol (VENTOLIN HFA) 108 (90 Base) MCG/ACT inhaler Inhale 1-2 puffs into the lungs every 6 (six) hours as needed for wheezing or shortness of breath. 18 g 0   dexamethasone (DECADRON) 4 MG tablet Take 2 tablets (8 mg total) by mouth daily. Start the day after chemotherapy for 2 days. Take with food. 8  tablet 5   lidocaine (LIDODERM) 5 % Place 1 patch onto the skin daily. Remove & Discard patch within 12 hours or as directed by MD 30 patch 0   methocarbamol (ROBAXIN) 500 MG tablet Take 1 tablet (500 mg total) by mouth 2 (two) times daily. 20 tablet 0   ondansetron (ZOFRAN) 8 MG tablet Take 1 tablet (8 mg total) by mouth every 8 (eight) hours as needed for nausea, vomiting or refractory nausea / vomiting. Start on the third day after chemotherapy. 30 tablet 1   predniSONE (DELTASONE) 10 MG tablet Take 3 tabs (30 mg total) daily x 2 days, then 2 tabs (20 mg total) daily x 2 days, then 1 tab (10 mg total) daily x 2 days, then stop. 12 tablet 0   prochlorperazine (COMPAZINE) 10 MG tablet Take 1 tablet (10 mg total) by mouth every 6 (six) hours as needed for nausea or vomiting. 30 tablet 1   No current facility-administered medications for this visit.    REVIEW OF SYSTEMS:    10 Point review of Systems was done is negative except as noted above.   PHYSICAL EXAMINATION: ECOG PERFORMANCE STATUS: 1 - Symptomatic but completely ambulatory .BP  110/76 (BP Location: Left Arm, Patient Position: Sitting)   Pulse 78   Temp 98.3 F (36.8 C) (Temporal)   Resp 18   Ht 6' (1.829 m)   Wt (!) 305 lb 11.2 oz (138.7 kg)   SpO2 100%   BMI 41.46 kg/m    GENERAL:alert, in no acute distress and comfortable SKIN: no acute rashes, no significant lesions EYES: conjunctiva are pink and non-injected, sclera anicteric OROPHARYNX: MMM, no exudates, no oropharyngeal erythema or ulceration NECK: supple, no JVD LYMPH:  no palpable lymphadenopathy in the cervical, axillary or inguinal regions LUNGS: clear to auscultation b/l with normal respiratory effort HEART: regular rate & rhythm ABDOMEN:  normoactive bowel sounds , non tender, not distended. Extremity: no pedal edema PSYCH: alert & oriented x 3 with fluent speech NEURO: no focal motor/sensory deficits   LABORATORY DATA:  I have reviewed the data as  listed    Latest Ref Rng & Units 05/16/2023    8:56 AM 05/05/2023   10:42 AM 04/21/2023   10:26 AM  CBC  WBC 4.0 - 10.5 K/uL 3.1  2.5  7.8   Hemoglobin 13.0 - 17.0 g/dL 40.9  81.1  91.4   Hematocrit 39.0 - 52.0 % 33.2  34.6  35.5   Platelets 150 - 400 K/uL 151  141  196   .    Latest Ref Rng & Units 05/16/2023    8:56 AM 05/05/2023   10:42 AM 04/21/2023   10:26 AM  CMP  Glucose 70 - 99 mg/dL 97  95  782   BUN 6 - 20 mg/dL 8  8  11    Creatinine 0.61 - 1.24 mg/dL 9.56  2.13  0.86   Sodium 135 - 145 mmol/L 140  139  140   Potassium 3.5 - 5.1 mmol/L 3.7  3.7  3.9   Chloride 98 - 111 mmol/L 109  107  108   CO2 22 - 32 mmol/L 25  27  27    Calcium 8.9 - 10.3 mg/dL 9.2  9.4  9.2   Total Protein 6.5 - 8.1 g/dL 7.4  7.5  7.7   Total Bilirubin 0.0 - 1.2 mg/dL 1.2  1.1  0.9   Alkaline Phos 38 - 126 U/L 315  292  419   AST 15 - 41 U/L 260  268  149   ALT 0 - 44 U/L 513  209  303     Cytology done 07/13/2021 revealed "FINAL MICROSCOPIC DIAGNOSIS:  A. PANCREAS, HEAD, FINE NEEDLE ASPIRATION:  - Malignant cells present  - Poorly differentiated/high-grade neuroendocrine carcinoma (see  comment)   B. CBD STRICTURE, BRUSHING:  - Atypical cells suspicious for tumor   C. BILIARY DILATION, BALLOON, REMOVAL:  - Atypical cells suspicious for tumor "  Surgical pathology done 07/13/2021 revealed "FINAL MICROSCOPIC DIAGNOSIS:   A. STOMACH, BIOPSY:  Reactive gastropathy and minimal chronic gastritis with lymphoid  aggregate  Negative for H. pylori, intestinal metaplasia, dysplasia and carcinoma"   Pathology 11/07/21: FINAL MICROSCOPIC DIAGNOSIS:   A. GALLBLADDER, CHOLECYSTECTOMY:  - Acute cholecystitis with necrosis and abscess.   RADIOGRAPHIC STUDIES: I have personally reviewed the radiological images as listed and agreed with the findings in the report. No results found.     ASSESSMENT & PLAN:   33 y.o. very pleasant male with  1. Poorly differentiated/high grade neuroendocrine  carcinoma - likely Stage IV metastatic to loco regional LNs and likely liver mets. -Cytology done 07/13/2021 shows presence of malignant cells in pancreas and  poorly differentiated/high grade neuroendocrine carcinoma.  2.  Recent hospitalization from pneumonia likely related to mycoplasma.  Respiratory viral panel showed rhinovirus/enterovirus.  3.  Hyperpigmentation from 5-FU chemotherapy  4.  Financial and work-related stressors.  PLAN:  -Discussed lab results on 05/16/2023 in detail with patient. CBC stable, showed WBC of 3.1K, hemoglobin of 11.2, and platelets of 151K. -reviewed CMP -Alkaline phosphatase 315 -AST 260 -ALT 513 -total bilirubin 1.2 -discussed that we had previously lowered his Irinotecan dose to 125 mg/m^2 to try to avoid bothering the liver. However, we will postpone treatment by one week due to liver function results on CMP despite Irinotecan dose reduction -Patient tolerated his previous cycle of his treatment well without any new or severe toxicities however with increased in LFTs likely from chtx despite irinotecan dose reduction will need to hold this treatment cycle and re-evaluate for next rx in 2 weeks.  -did not feel any enlarged lymph nodes during physical examination -answered all of patient's questions in detail  FOLLOW-UP: Hyold this cycle of treatment  F/u for next cycle of treatment as scheduled in 2 weeks  The total time spent in the appointment was 20 minutes* .  All of the patient's questions were answered with apparent satisfaction. The patient knows to call the clinic with any problems, questions or concerns.   Jacquelyn Matt MD MS AAHIVMS [facility] Hematology/Oncology Physician [facility]  .*Total Encounter Time as defined by the Centers for Medicare and Medicaid Services includes, in addition to the face-to-face time of a patient visit (documented in the note above) non-face-to-face time: obtaining and reviewing outside history,  ordering and reviewing medications, tests or procedures, care coordination (communications with other health care professionals or caregivers) and documentation in the medical record.    I,Mitra Faeizi,acting as a Neurosurgeon for Jacquelyn Matt, MD.,have documented all relevant documentation on the behalf of Jacquelyn Matt, MD,as directed by  Jacquelyn Matt, MD while in the presence of Jacquelyn Matt, MD.  .I have reviewed the above documentation for accuracy and completeness, and I agree with the above. .Niasha Devins Kishore Isolde Skaff MD

## 2023-05-16 ENCOUNTER — Inpatient Hospital Stay (HOSPITAL_BASED_OUTPATIENT_CLINIC_OR_DEPARTMENT_OTHER): Admitting: Hematology

## 2023-05-16 ENCOUNTER — Telehealth: Payer: Self-pay | Admitting: Hematology

## 2023-05-16 ENCOUNTER — Inpatient Hospital Stay

## 2023-05-16 VITALS — BP 110/76 | HR 78 | Temp 98.3°F | Resp 18 | Ht 72.0 in | Wt 305.7 lb

## 2023-05-16 DIAGNOSIS — C7A1 Malignant poorly differentiated neuroendocrine tumors: Secondary | ICD-10-CM | POA: Diagnosis not present

## 2023-05-16 DIAGNOSIS — Z5111 Encounter for antineoplastic chemotherapy: Secondary | ICD-10-CM | POA: Diagnosis not present

## 2023-05-16 DIAGNOSIS — Z95828 Presence of other vascular implants and grafts: Secondary | ICD-10-CM

## 2023-05-16 DIAGNOSIS — Z7189 Other specified counseling: Secondary | ICD-10-CM

## 2023-05-16 LAB — CMP (CANCER CENTER ONLY)
ALT: 513 U/L (ref 0–44)
AST: 260 U/L (ref 15–41)
Albumin: 4.1 g/dL (ref 3.5–5.0)
Alkaline Phosphatase: 315 U/L — ABNORMAL HIGH (ref 38–126)
Anion gap: 6 (ref 5–15)
BUN: 8 mg/dL (ref 6–20)
CO2: 25 mmol/L (ref 22–32)
Calcium: 9.2 mg/dL (ref 8.9–10.3)
Chloride: 109 mmol/L (ref 98–111)
Creatinine: 0.75 mg/dL (ref 0.61–1.24)
GFR, Estimated: >60 mL/min (ref >60)
Glucose, Bld: 97 mg/dL (ref 70–99)
Potassium: 3.7 mmol/L (ref 3.5–5.1)
Sodium: 140 mmol/L (ref 135–145)
Total Bilirubin: 1.2 mg/dL (ref 0.0–1.2)
Total Protein: 7.4 g/dL (ref 6.5–8.1)

## 2023-05-16 LAB — CBC WITH DIFFERENTIAL (CANCER CENTER ONLY)
Abs Immature Granulocytes: 0.02 10*3/uL (ref 0.00–0.07)
Basophils Absolute: 0 10*3/uL (ref 0.0–0.1)
Basophils Relative: 0 %
Eosinophils Absolute: 0.1 10*3/uL (ref 0.0–0.5)
Eosinophils Relative: 4 %
HCT: 33.2 % — ABNORMAL LOW (ref 39.0–52.0)
Hemoglobin: 11.2 g/dL — ABNORMAL LOW (ref 13.0–17.0)
Immature Granulocytes: 1 %
Lymphocytes Relative: 20 %
Lymphs Abs: 0.6 10*3/uL — ABNORMAL LOW (ref 0.7–4.0)
MCH: 28.5 pg (ref 26.0–34.0)
MCHC: 33.7 g/dL (ref 30.0–36.0)
MCV: 84.5 fL (ref 80.0–100.0)
Monocytes Absolute: 0.6 10*3/uL (ref 0.1–1.0)
Monocytes Relative: 20 %
Neutro Abs: 1.7 10*3/uL (ref 1.7–7.7)
Neutrophils Relative %: 55 %
Platelet Count: 151 10*3/uL (ref 150–400)
RBC: 3.93 MIL/uL — ABNORMAL LOW (ref 4.22–5.81)
RDW: 16.2 % — ABNORMAL HIGH (ref 11.5–15.5)
WBC Count: 3.1 10*3/uL — ABNORMAL LOW (ref 4.0–10.5)
nRBC: 0 % (ref 0.0–0.2)

## 2023-05-16 MED ORDER — HEPARIN SOD (PORK) LOCK FLUSH 100 UNIT/ML IV SOLN
500.0000 [IU] | Freq: Once | INTRAVENOUS | Status: AC
Start: 2023-05-16 — End: 2023-05-16
  Administered 2023-05-16: 500 [IU]

## 2023-05-16 MED ORDER — SODIUM CHLORIDE 0.9% FLUSH
10.0000 mL | Freq: Once | INTRAVENOUS | Status: AC
  Administered 2023-05-16: 10 mL

## 2023-05-16 NOTE — Telephone Encounter (Signed)
 Left patient a vm regarding upcoming appointment

## 2023-05-16 NOTE — Progress Notes (Signed)
 Patient seen by Dr. Addison Naegeli are within treatment parameters.  Labs reviewed: and are not all within treatment parameters.   AST: 260, Alt: 513  Per physician team,     Pt supposed to get tx on Monday 05/19/23 but will postpone for 2 weeks.

## 2023-05-17 ENCOUNTER — Other Ambulatory Visit: Payer: Self-pay

## 2023-05-19 ENCOUNTER — Inpatient Hospital Stay

## 2023-05-21 ENCOUNTER — Encounter

## 2023-05-22 ENCOUNTER — Encounter: Payer: Self-pay | Admitting: Hematology

## 2023-05-27 ENCOUNTER — Encounter: Payer: Self-pay | Admitting: Hematology

## 2023-05-27 NOTE — Progress Notes (Signed)
 This encounter was created in error - please disregard.

## 2023-06-03 ENCOUNTER — Other Ambulatory Visit: Payer: Self-pay

## 2023-06-03 DIAGNOSIS — C7A1 Malignant poorly differentiated neuroendocrine tumors: Secondary | ICD-10-CM

## 2023-06-04 ENCOUNTER — Inpatient Hospital Stay

## 2023-06-04 ENCOUNTER — Inpatient Hospital Stay (HOSPITAL_BASED_OUTPATIENT_CLINIC_OR_DEPARTMENT_OTHER): Admitting: Hematology

## 2023-06-04 VITALS — BP 110/78 | HR 90 | Temp 97.8°F | Resp 18 | Ht 72.0 in | Wt 291.2 lb

## 2023-06-04 DIAGNOSIS — R7989 Other specified abnormal findings of blood chemistry: Secondary | ICD-10-CM

## 2023-06-04 DIAGNOSIS — C7A1 Malignant poorly differentiated neuroendocrine tumors: Secondary | ICD-10-CM | POA: Diagnosis not present

## 2023-06-04 DIAGNOSIS — Z95828 Presence of other vascular implants and grafts: Secondary | ICD-10-CM

## 2023-06-04 LAB — CBC WITH DIFFERENTIAL (CANCER CENTER ONLY)
Abs Immature Granulocytes: 0.01 10*3/uL (ref 0.00–0.07)
Basophils Absolute: 0 10*3/uL (ref 0.0–0.1)
Basophils Relative: 0 %
Eosinophils Absolute: 0.4 10*3/uL (ref 0.0–0.5)
Eosinophils Relative: 7 %
HCT: 35.3 % — ABNORMAL LOW (ref 39.0–52.0)
Hemoglobin: 11.8 g/dL — ABNORMAL LOW (ref 13.0–17.0)
Immature Granulocytes: 0 %
Lymphocytes Relative: 12 %
Lymphs Abs: 0.7 10*3/uL (ref 0.7–4.0)
MCH: 28.9 pg (ref 26.0–34.0)
MCHC: 33.4 g/dL (ref 30.0–36.0)
MCV: 86.5 fL (ref 80.0–100.0)
Monocytes Absolute: 0.6 10*3/uL (ref 0.1–1.0)
Monocytes Relative: 11 %
Neutro Abs: 4 10*3/uL (ref 1.7–7.7)
Neutrophils Relative %: 70 %
Platelet Count: 222 10*3/uL (ref 150–400)
RBC: 4.08 MIL/uL — ABNORMAL LOW (ref 4.22–5.81)
RDW: 17.1 % — ABNORMAL HIGH (ref 11.5–15.5)
WBC Count: 5.7 10*3/uL (ref 4.0–10.5)
nRBC: 0 % (ref 0.0–0.2)

## 2023-06-04 LAB — CMP (CANCER CENTER ONLY)
ALT: 169 U/L — ABNORMAL HIGH (ref 0–44)
AST: 185 U/L (ref 15–41)
Albumin: 4.2 g/dL (ref 3.5–5.0)
Alkaline Phosphatase: 662 U/L — ABNORMAL HIGH (ref 38–126)
Anion gap: 7 (ref 5–15)
BUN: 17 mg/dL (ref 6–20)
CO2: 25 mmol/L (ref 22–32)
Calcium: 9.6 mg/dL (ref 8.9–10.3)
Chloride: 105 mmol/L (ref 98–111)
Creatinine: 0.68 mg/dL (ref 0.61–1.24)
GFR, Estimated: >60 mL/min (ref >60)
Glucose, Bld: 94 mg/dL (ref 70–99)
Potassium: 3.8 mmol/L (ref 3.5–5.1)
Sodium: 137 mmol/L (ref 135–145)
Total Bilirubin: 3.4 mg/dL — ABNORMAL HIGH (ref 0.0–1.2)
Total Protein: 8.1 g/dL (ref 6.5–8.1)

## 2023-06-04 MED ORDER — SODIUM CHLORIDE 0.9% FLUSH
10.0000 mL | Freq: Once | INTRAVENOUS | Status: AC
  Administered 2023-06-04: 10 mL

## 2023-06-04 NOTE — Progress Notes (Signed)
 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   HISTORY OF PRESENTING ILLNESS:  Please see previous note for details on initial presentation  INTERVAL HISTORY:  Leon Fischer. Is a 33 y.o. 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  Patient was last seen by me on 05/16/2023 and was doing well overall with no new medical complaints.   Today, he is accompanied by his mother. Patient reports that he has been doing well since his last clincal visit.   His energy levels have been stable and he has been eating well. Patient denies any infection issues, new cough, signs of pneumonia, fever, chills, night sweats, new abdominal pain, change in bowel habits, abdominal pain, or leg swelling.   MEDICAL HISTORY:  Past Medical History:  Diagnosis Date   Asthma    Neuroendocrine cancer (HCC)     SURGICAL HISTORY: Past Surgical History:  Procedure Laterality Date   BILIARY DILATION  07/13/2021   Procedure: BILIARY DILATION;  Surgeon: Ozell Blunt, MD;  Location: [facility];  Service: Gastroenterology;;   BILIARY STENT PLACEMENT N/A 07/13/2021   Procedure: BILIARY STENT PLACEMENT;  Surgeon: Ozell Blunt, MD;  Location: [name];  Service: Gastroenterology;  Laterality: N/A;   BILIARY STENT PLACEMENT N/A 11/05/2021   Procedure: BILIARY STENT PLACEMENT;  Surgeon: Ozell Blunt, MD;  Location: [name];  Service: Gastroenterology;  Laterality: N/A;   BIOPSY  07/13/2021   Procedure: BIOPSY;  Surgeon: Brice Campi Albino Alu., MD;  Location: [facility];  Service: Gastroenterology;;   CHOLECYSTECTOMY N/A 11/07/2021    Procedure: LAPAROSCOPIC CHOLECYSTECTOMY;  Surgeon: Adalberto Acton, MD;  Location: [facility];  Service: General;  Laterality: N/A;   ENDOSCOPIC RETROGRADE CHOLANGIOPANCREATOGRAPHY (ERCP) WITH PROPOFOL  N/A 07/13/2021   Procedure: ENDOSCOPIC RETROGRADE CHOLANGIOPANCREATOGRAPHY (ERCP) WITH PROPOFOL ;  Surgeon: Ozell Blunt, MD;  Location: [name];  Service: Gastroenterology;  Laterality: N/A;   ERCP N/A 11/05/2021   Procedure: ENDOSCOPIC RETROGRADE CHOLANGIOPANCREATOGRAPHY (ERCP);  Surgeon: Ozell Blunt, MD;  Location: [facility];  Service: Gastroenterology;  Laterality: N/A;   ESOPHAGOGASTRODUODENOSCOPY (EGD) WITH PROPOFOL  N/A 07/13/2021   Procedure: ESOPHAGOGASTRODUODENOSCOPY (EGD) WITH PROPOFOL ;  Surgeon: Brice Campi Albino Alu., MD;  Location: [name];  Service: Gastroenterology;  Laterality: N/A;   EUS N/A 07/13/2021   Procedure: UPPER ENDOSCOPIC ULTRASOUND (EUS) LINEAR;  Surgeon: Normie Becton., MD;  Location: [name];  Service: Gastroenterology;  Laterality: N/A;   FINE NEEDLE ASPIRATION  07/13/2021   Procedure: FINE NEEDLE ASPIRATION (FNA) LINEAR;  Surgeon: Brice Campi Albino Alu., MD;  Location: [facility];  Service: Gastroenterology;;   IR IMAGING GUIDED PORT INSERTION  07/31/2021   SPHINCTEROTOMY  07/13/2021   Procedure: [address];  Surgeon: Ozell Blunt, MD;  Location: [name];  Service: Gastroenterology;;    SOCIAL HISTORY: Social History   Socioeconomic History   Marital status: Single    Spouse name: Not on file   Number of children: 0   Years of education: Not on file   Highest education level: Some college, no degree  Occupational History   Not on file  Tobacco Use   Smoking status: Some Days    Types: Cigarettes   Smokeless tobacco: Never  Vaping Use   Vaping status: Never Used  Substance and Sexual Activity  Alcohol use: Yes    Comment: social   Drug use: No   Sexual activity: Never  Other Topics Concern   Not on file  Social History  Narrative   Not on file   Social Drivers of Health   Financial Resource Strain: Low Risk  (04/14/2017)   Overall Financial Resource Strain (CARDIA)    Difficulty of Paying Living Expenses: Not hard at all  Food Insecurity: No Food Insecurity (01/21/2023)   Hunger Vital Sign    Worried About Running Out of Food in the Last Year: Never true    Ran Out of Food in the Last Year: Never true  Transportation Needs: No Transportation Needs (01/21/2023)   PRAPARE - Administrator, Civil Service (Medical): No    Lack of Transportation (Non-Medical): No  Physical Activity: Inactive (04/14/2017)   Exercise Vital Sign    Days of Exercise per Week: 0 days    Minutes of Exercise per Session: 0 min  Stress: Stress Concern Present (04/14/2017)   [name] of Occupational Health - Occupational Stress Questionnaire    Feeling of Stress : Rather much  Social Connections: Unknown (06/19/2021)   Received [facility]   Social Network    Social Network: Not on file  Intimate Partner Violence: Not At Risk (01/21/2023)   Humiliation, Afraid, Rape, and Kick questionnaire    Fear of Current or Ex-Partner: No    Emotionally Abused: No    Physically Abused: No    Sexually Abused: No    FAMILY HISTORY: Family History  Problem Relation Age of Onset   Drug abuse Father    ALLERGIES:  is allergic to shellfish allergy.  MEDICATIONS:  Current Outpatient Medications  Medication Sig Dispense Refill   acetaminophen  (TYLENOL ) 500 MG tablet Take 1,000 mg by mouth every 6 (six) hours as needed for mild pain.     albuterol  (VENTOLIN  HFA) 108 (90 Base) MCG/ACT inhaler Inhale 1-2 puffs into the lungs every 6 (six) hours as needed for wheezing or shortness of breath. 18 g 0   dexamethasone  (DECADRON ) 4 MG tablet Take 2 tablets (8 mg total) by mouth daily. Start the day after chemotherapy for 2 days. Take with food. 8 tablet 5   lidocaine  (LIDODERM ) 5 % Place 1 patch onto the  skin daily. Remove & Discard patch within 12 hours or as directed by MD 30 patch 0   methocarbamol  (ROBAXIN ) 500 MG tablet Take 1 tablet (500 mg total) by mouth 2 (two) times daily. 20 tablet 0   ondansetron  (ZOFRAN ) 8 MG tablet Take 1 tablet (8 mg total) by mouth every 8 (eight) hours as needed for nausea, vomiting or refractory nausea / vomiting. Start on the third day after chemotherapy. 30 tablet 1   predniSONE  (DELTASONE ) 10 MG tablet Take 3 tabs (30 mg total) daily x 2 days, then 2 tabs (20 mg total) daily x 2 days, then 1 tab (10 mg total) daily x 2 days, then stop. 12 tablet 0   prochlorperazine  (COMPAZINE ) 10 MG tablet Take 1 tablet (10 mg total) by mouth every 6 (six) hours as needed for nausea or vomiting. 30 tablet 1   No current facility-administered medications for this visit.    REVIEW OF SYSTEMS:    10 Point review of Systems was done is negative except as noted above.   PHYSICAL EXAMINATION: ECOG PERFORMANCE STATUS: 1 - Symptomatic but completely ambulatory .There were no vitals taken for this visit.  GENERAL:alert, in no acute distress and comfortable SKIN: no acute rashes, no significant lesions EYES: conjunctiva are pink and non-injected, sclera anicteric OROPHARYNX: MMM, no exudates, no oropharyngeal erythema or ulceration NECK: supple, no JVD LYMPH:  no palpable lymphadenopathy in the cervical, axillary or inguinal regions LUNGS: clear to auscultation b/l with normal respiratory effort HEART: regular rate & rhythm ABDOMEN:  normoactive bowel sounds , non tender, not distended. Extremity: no pedal edema PSYCH: alert & oriented x 3 with fluent speech NEURO: no focal motor/sensory deficits   LABORATORY DATA:  I have reviewed the data as listed    Latest Ref Rng & Units 05/16/2023    8:56 AM 05/05/2023   10:42 AM 04/21/2023   10:26 AM  CBC  WBC 4.0 - 10.5 K/uL 3.1  2.5  7.8   Hemoglobin 13.0 - 17.0 g/dL 16.1  09.6  04.5   Hematocrit 39.0 - 52.0 % 33.2  34.6   35.5   Platelets 150 - 400 K/uL 151  141  196   .    Latest Ref Rng & Units 05/16/2023    8:56 AM 05/05/2023   10:42 AM 04/21/2023   10:26 AM  CMP  Glucose 70 - 99 mg/dL 97  95  409   BUN 6 - 20 mg/dL 8  8  11    Creatinine 0.61 - 1.24 mg/dL 8.11  9.14  7.82   Sodium 135 - 145 mmol/L 140  139  140   Potassium 3.5 - 5.1 mmol/L 3.7  3.7  3.9   Chloride 98 - 111 mmol/L 109  107  108   CO2 22 - 32 mmol/L 25  27  27    Calcium  8.9 - 10.3 mg/dL 9.2  9.4  9.2   Total Protein 6.5 - 8.1 g/dL 7.4  7.5  7.7   Total Bilirubin 0.0 - 1.2 mg/dL 1.2  1.1  0.9   Alkaline Phos 38 - 126 U/L 315  292  419   AST 15 - 41 U/L 260  268  149   ALT 0 - 44 U/L 513  209  303     Cytology done 07/13/2021 revealed "FINAL MICROSCOPIC DIAGNOSIS:  A. PANCREAS, HEAD, FINE NEEDLE ASPIRATION:  - Malignant cells present  - Poorly differentiated/high-grade neuroendocrine carcinoma (see  comment)   B. CBD STRICTURE, BRUSHING:  - Atypical cells suspicious for tumor   C. BILIARY DILATION, BALLOON, REMOVAL:  - Atypical cells suspicious for tumor "  Surgical pathology done 07/13/2021 revealed "FINAL MICROSCOPIC DIAGNOSIS:   A. STOMACH, BIOPSY:  Reactive gastropathy and minimal chronic gastritis with lymphoid  aggregate  Negative for H. pylori, intestinal metaplasia, dysplasia and carcinoma"   Pathology 11/07/21: FINAL MICROSCOPIC DIAGNOSIS:   A. GALLBLADDER, CHOLECYSTECTOMY:  - Acute cholecystitis with necrosis and abscess.   RADIOGRAPHIC STUDIES: I have personally reviewed the radiological images as listed and agreed with the findings in the report. No results found.     ASSESSMENT & PLAN:   33 y.o. very pleasant male with  1. 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 -Cytology done 07/13/2021 shows presence of malignant cells in pancreas and poorly differentiated/high grade neuroendocrine carcinoma.  2.  Recent  hospitalization from pneumonia likely related to mycoplasma.  Respiratory viral panel showed rhinovirus/enterovirus.  3.  Hyperpigmentation from 5-FU chemotherapy  4.  Financial and work-related stressors.  PLAN:  -Discussed lab results on 06/04/23 in detail with patient. CBC showed WBC of  5.7K, hemoglobin of 11.8, and platelets of 222K. -Hgb has improved due to not being on chemotherapy for a while -will continue to hold Irinotecan  treatment today due to elevated bilirubin at 3.4 mg/dL -AST 147  -ALT 829 -discussed that next-line treatments would likely be Temodar and potentially a second medication, Capecitabine  -will order CT chest/abdm/pelvis scan stat  FOLLOW-UP: Ct chest/abd/pelvis stat Phone visit with Dr Salomon Cree in 1 week  The total time spent in the appointment was *** minutes* .  All of the patient's questions were answered with apparent satisfaction. The patient knows to call the clinic with any problems, questions or concerns.   Jacquelyn Matt MD MS AAHIVMS [facility] Hematology/Oncology Physician [facility]  .*Total Encounter Time as defined by the Centers for Medicare and Medicaid Services includes, in addition to the face-to-face time of a patient visit (documented in the note above) non-face-to-face time: obtaining and reviewing outside history, ordering and reviewing medications, tests or procedures, care coordination (communications with other health care professionals or caregivers) and documentation in the medical record.    I,Mitra Faeizi,acting as a Neurosurgeon for Jacquelyn Matt, MD.,have documented all relevant documentation on the behalf of Jacquelyn Matt, MD,as directed by  Jacquelyn Matt, MD while in the presence of Jacquelyn Matt, MD.  ***

## 2023-06-04 NOTE — Progress Notes (Signed)
 Patient seen by Dr. Minnie Amber are within treatment parameters.  Labs reviewed: and are not all within treatment parameters.    Dr Salomon Cree aware: AST: 185, ALT: 169, Tbili: 3.4, Alk Phos: 662  Per physician team, patient will not be receiving treatment today.

## 2023-06-05 ENCOUNTER — Ambulatory Visit (HOSPITAL_COMMUNITY)
Admission: RE | Admit: 2023-06-05 | Discharge: 2023-06-05 | Disposition: A | Discharge status: Home / Self Care | Source: Ambulatory Visit | Attending: Hematology | Admitting: Hematology

## 2023-06-05 ENCOUNTER — Encounter (HOSPITAL_COMMUNITY): Payer: Self-pay

## 2023-06-05 DIAGNOSIS — C7A1 Malignant poorly differentiated neuroendocrine tumors: Secondary | ICD-10-CM

## 2023-06-05 DIAGNOSIS — R7989 Other specified abnormal findings of blood chemistry: Secondary | ICD-10-CM | POA: Diagnosis present

## 2023-06-05 MED ORDER — SODIUM CHLORIDE (PF) 0.9 % IJ SOLN
INTRAMUSCULAR | Status: AC
  Filled 2023-06-05: qty 50

## 2023-06-05 MED ORDER — IOHEXOL 300 MG/ML  SOLN
100.0000 mL | Freq: Once | INTRAMUSCULAR | Status: AC | PRN
Start: 2023-06-05 — End: 2023-06-05
  Administered 2023-06-05: 100 mL via INTRAVENOUS

## 2023-06-06 ENCOUNTER — Other Ambulatory Visit: Payer: Self-pay

## 2023-06-06 ENCOUNTER — Inpatient Hospital Stay

## 2023-06-10 ENCOUNTER — Encounter: Payer: Self-pay | Admitting: Hematology

## 2023-06-10 NOTE — Progress Notes (Signed)
 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   HISTORY OF PRESENTING ILLNESS:  Please see previous note for details on initial presentation  INTERVAL HISTORY:  Leon Fischer. Is a 33 y.o. 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   Patient was last seen by me on 06/04/2023 and was doing well overall with no new medical complaints.   I connected with Leon Fischer. on 06/11/2023 at  3:30 PM EDT by telephone visit and verified that I am speaking with the correct person using two identifiers.   I discussed the limitations, risks, security and privacy concerns of performing an evaluation and management service by telemedicine and the availability of in-person appointments. I also discussed with the patient that there may be a patient responsible charge related to this service. The patient expressed understanding and agreed to proceed.   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    Today, The results of his CT chest/abdomen pelvis scan was discussed with him in detail.  MEDICAL HISTORY:  Past Medical History:  Diagnosis Date   Asthma    Neuroendocrine cancer (HCC)     SURGICAL HISTORY: Past Surgical History:  Procedure Laterality Date   BILIARY DILATION  07/13/2021   Procedure: BILIARY DILATION;  Surgeon: Ozell Blunt, MD;  Location: [facility];  Service: Gastroenterology;;    BILIARY STENT PLACEMENT N/A 07/13/2021   Procedure: BILIARY STENT PLACEMENT;  Surgeon: Ozell Blunt, MD;  Location: [name];  Service: Gastroenterology;  Laterality: N/A;   BILIARY STENT PLACEMENT N/A 11/05/2021   Procedure: BILIARY STENT PLACEMENT;  Surgeon: Ozell Blunt, MD;  Location: [name];  Service: Gastroenterology;  Laterality: N/A;   BIOPSY  07/13/2021   Procedure: BIOPSY;  Surgeon: Brice Campi Albino Alu., MD;  Location: [facility];  Service: Gastroenterology;;   CHOLECYSTECTOMY N/A 11/07/2021   Procedure: LAPAROSCOPIC CHOLECYSTECTOMY;  Surgeon: Adalberto Acton, MD;  Location: [facility];  Service: General;  Laterality: N/A;   ENDOSCOPIC RETROGRADE CHOLANGIOPANCREATOGRAPHY (ERCP) WITH PROPOFOL  N/A 07/13/2021   Procedure: ENDOSCOPIC RETROGRADE CHOLANGIOPANCREATOGRAPHY (ERCP) WITH PROPOFOL ;  Surgeon: Ozell Blunt, MD;  Location: [name];  Service: Gastroenterology;  Laterality: N/A;   ERCP N/A 11/05/2021   Procedure: ENDOSCOPIC RETROGRADE CHOLANGIOPANCREATOGRAPHY (ERCP);  Surgeon: Ozell Blunt, MD;  Location: [facility];  Service: Gastroenterology;  Laterality: N/A;   ESOPHAGOGASTRODUODENOSCOPY (EGD) WITH PROPOFOL  N/A 07/13/2021   Procedure: ESOPHAGOGASTRODUODENOSCOPY (EGD) WITH PROPOFOL ;  Surgeon: Brice Campi Albino Alu., MD;  Location: [name];  Service: Gastroenterology;  Laterality: N/A;   EUS N/A 07/13/2021   Procedure: UPPER ENDOSCOPIC ULTRASOUND (EUS) LINEAR;  Surgeon: Normie Becton., MD;  Location: [name];  Service: Gastroenterology;  Laterality: N/A;   FINE NEEDLE ASPIRATION  07/13/2021   Procedure: FINE NEEDLE ASPIRATION (FNA) LINEAR;  Surgeon: Normie Becton., MD;  Location: [facility];  Service: Gastroenterology;;   IR IMAGING GUIDED PORT INSERTION  07/31/2021   SPHINCTEROTOMY  07/13/2021   Procedure: [address];  Surgeon: Ozell Blunt, MD;  Location: [name];  Service: Gastroenterology;;    SOCIAL HISTORY: Social History  Socioeconomic History   Marital status: Single    Spouse name: Not on file   Number of children: 0   Years of education: Not on file   Highest education level: Some college, no degree  Occupational History   Not on file  Tobacco Use   Smoking status: Some Days    Types: Cigarettes   Smokeless tobacco: Never  Vaping Use   Vaping status: Never Used  Substance and Sexual Activity   Alcohol use: Yes    Comment: social   Drug use: No   Sexual activity: Never  Other Topics Concern   Not on file  Social History Narrative   Not on file   Social Drivers of Health   Financial Resource Strain: Low Risk  (04/14/2017)   Overall Financial Resource Strain (CARDIA)    Difficulty of Paying Living Expenses: Not hard at all  Food Insecurity: No Food Insecurity (01/21/2023)   Hunger Vital Sign    Worried About Running Out of Food in the Last Year: Never true    Ran Out of Food in the Last Year: Never true  Transportation Needs: No Transportation Needs (01/21/2023)   PRAPARE - Administrator, Civil Service (Medical): No    Lack of Transportation (Non-Medical): No  Physical Activity: Inactive (04/14/2017)   Exercise Vital Sign    Days of Exercise per Week: 0 days    Minutes of Exercise per Session: 0 min  Stress: Stress Concern Present (04/14/2017)   [name] of Occupational Health - Occupational Stress Questionnaire    Feeling of Stress : Rather much  Social Connections: Unknown (06/19/2021)   Received from [facility]   Social Network    Social Network: Not on file  Intimate Partner Violence: Not At Risk (01/21/2023)   Humiliation, Afraid, Rape, and Kick questionnaire    Fear of Current or Ex-Partner: No    Emotionally Abused: No    Physically Abused: No    Sexually Abused: No    FAMILY HISTORY: Family History  Problem Relation Age of Onset   Drug abuse Father    ALLERGIES:  is allergic to shellfish allergy.  MEDICATIONS:  Current  Outpatient Medications  Medication Sig Dispense Refill   acetaminophen  (TYLENOL ) 500 MG tablet Take 1,000 mg by mouth every 6 (six) hours as needed for mild pain.     albuterol  (VENTOLIN  HFA) 108 (90 Base) MCG/ACT inhaler Inhale 1-2 puffs into the lungs every 6 (six) hours as needed for wheezing or shortness of breath. 18 g 0   dexamethasone  (DECADRON ) 4 MG tablet Take 2 tablets (8 mg total) by mouth daily. Start the day after chemotherapy for 2 days. Take with food. 8 tablet 5   lidocaine  (LIDODERM ) 5 % Place 1 patch onto the skin daily. Remove & Discard patch within 12 hours or as directed by MD 30 patch 0   methocarbamol  (ROBAXIN ) 500 MG tablet Take 1 tablet (500 mg total) by mouth 2 (two) times daily. 20 tablet 0   ondansetron  (ZOFRAN ) 8 MG tablet Take 1 tablet (8 mg total) by mouth every 8 (eight) hours as needed for nausea, vomiting or refractory nausea / vomiting. Start on the third day after chemotherapy. 30 tablet 1   predniSONE  (DELTASONE ) 10 MG tablet Take 3 tabs (30 mg total) daily x 2 days, then 2 tabs (20 mg total) daily x 2 days, then 1 tab (10 mg total) daily x 2 days, then stop. 12 tablet 0  prochlorperazine  (COMPAZINE ) 10 MG tablet Take 1 tablet (10 mg total) by mouth every 6 (six) hours as needed for nausea or vomiting. 30 tablet 1   No current facility-administered medications for this visit.    REVIEW OF SYSTEMS:    10 Point review of Systems was done is negative except as noted above.   PHYSICAL EXAMINATION: TELEMEDICINE VISIT ECOG PERFORMANCE STATUS: 1 - Symptomatic but completely ambulatory .There were no vitals taken for this visit.  LABORATORY DATA:  I have reviewed the data as listed    Latest Ref Rng & Units 06/04/2023   11:52 AM 05/16/2023    8:56 AM 05/05/2023   10:42 AM  CBC  WBC 4.0 - 10.5 K/uL 5.7  3.1  2.5   Hemoglobin 13.0 - 17.0 g/dL 96.0  45.4  09.8   Hematocrit 39.0 - 52.0 % 35.3  33.2  34.6   Platelets 150 - 400 K/uL 222  151  141   .    Latest  Ref Rng & Units 06/04/2023   11:52 AM 05/16/2023    8:56 AM 05/05/2023   10:42 AM  CMP  Glucose 70 - 99 mg/dL 94  97  95   BUN 6 - 20 mg/dL 17  8  8    Creatinine 0.61 - 1.24 mg/dL 1.19  1.47  8.29   Sodium 135 - 145 mmol/L 137  140  139   Potassium 3.5 - 5.1 mmol/L 3.8  3.7  3.7   Chloride 98 - 111 mmol/L 105  109  107   CO2 22 - 32 mmol/L 25  25  27    Calcium  8.9 - 10.3 mg/dL 9.6  9.2  9.4   Total Protein 6.5 - 8.1 g/dL 8.1  7.4  7.5   Total Bilirubin 0.0 - 1.2 mg/dL 3.4  1.2  1.1   Alkaline Phos 38 - 126 U/L 662  315  292   AST 15 - 41 U/L 185  260  268   ALT 0 - 44 U/L 169  513  209     Cytology done 07/13/2021 revealed "FINAL MICROSCOPIC DIAGNOSIS:  A. PANCREAS, HEAD, FINE NEEDLE ASPIRATION:  - Malignant cells present  - Poorly differentiated/high-grade neuroendocrine carcinoma (see  comment)   B. CBD STRICTURE, BRUSHING:  - Atypical cells suspicious for tumor   C. BILIARY DILATION, BALLOON, REMOVAL:  - Atypical cells suspicious for tumor "  Surgical pathology done 07/13/2021 revealed "FINAL MICROSCOPIC DIAGNOSIS:   A. STOMACH, BIOPSY:  Reactive gastropathy and minimal chronic gastritis with lymphoid  aggregate  Negative for H. pylori, intestinal metaplasia, dysplasia and carcinoma"   Pathology 11/07/21: FINAL MICROSCOPIC DIAGNOSIS:   A. GALLBLADDER, CHOLECYSTECTOMY:  - Acute cholecystitis with necrosis and abscess.   RADIOGRAPHIC STUDIES: I have personally reviewed the radiological images as listed and agreed with the findings in the report. CT CHEST ABDOMEN PELVIS W CONTRAST Result Date: 06/05/2023 CLINICAL DATA:  Metastatic neuroendocrine tumor, high-grade. Worsening liver function studies. Currently on chemotherapy. Evaluate for disease progression. No current patient complaints. EXAM: CT CHEST, ABDOMEN, AND PELVIS WITH CONTRAST TECHNIQUE: Multidetector CT imaging of the chest, abdomen and pelvis was performed following the standard protocol during bolus  administration of intravenous contrast. Arterial phase images of the abdomen are obtained followed by portal venous phase images of the chest, abdomen and pelvis. RADIATION DOSE REDUCTION: This exam was performed according to the departmental dose-optimization program which includes automated exposure control, adjustment of the mA and/or kV according to patient size and/or use  of iterative reconstruction technique. CONTRAST:  OMNIPAQUE  IOHEXOL  300 MG/ML  SOLN COMPARISON:  CT chest abdomen and pelvis 04/11/2023 FINDINGS: CT CHEST FINDINGS Cardiovascular: Normal heart size. No pericardial effusions. Normal caliber thoracic aorta. Central venous catheter with tip at the cavoatrial junction. Mediastinum/Nodes: Thyroid gland is unremarkable. Esophagus is decompressed. No significant lymphadenopathy in the chest. Lungs/Pleura: Lungs are clear.  No pleural effusion or pneumothorax. Musculoskeletal: Degenerative changes in the spine. No focal bone lesions. CT ABDOMEN PELVIS FINDINGS Hepatobiliary: Wall stent in the common bile duct with associated pneumobilia. Surgical absence of the gallbladder. No significant bile duct dilatation. No focal liver lesions are identified. Pancreas: Mass in the head of the pancreas measuring 4.6 cm in diameter, similar to prior study. Atrophy of the body and tail of the pancreas with pancreatic ductal dilatation, also similar. Celiac axis and peripancreatic lymph nodes are enlarged, measuring up to about 12 mm in short axis dimension. This is likely metastatic. Diffuse upper abdominal varices are present. Spleen: Spleen is enlarged.  No focal lesions. Adrenals/Urinary Tract: Adrenal glands are unremarkable. Kidneys are normal, without renal calculi, focal lesion, or hydronephrosis. Bladder wall is diffusely thickened. This could be due to under distention or cystitis. Correlate with urinary analysis. Stomach/Bowel: Stomach, small bowel, and colon are not abnormally distended. No wall  thickening or inflammatory changes are appreciated. Mild mesenteric edema. No loculated collections. Appendix is normal. Vascular/Lymphatic: Normal caliber abdominal aorta. Scattered retroperitoneal lymph nodes are unchanged since prior study. Reproductive: Prostate is unremarkable. Other: No free air or free fluid in the abdomen. Abdominal wall musculature appears intact. Musculoskeletal: Degenerative changes in the spine. No focal bone lesions. IMPRESSION: 1. Mass in the head of the pancreas without significant change. Pancreatic parenchymal atrophy and pancreatic ductal dilatation is also present. 2. Stent in the common bile duct with associated pneumobilia. No bile duct dilatation. 3. Prominent lymph nodes in the upper abdominal and retroperitoneal regions, likely metastatic, unchanged. 4. No evidence of metastatic disease in the chest. 5. Bladder wall thickening may be due to under distention or cystitis. Correlate with urinalysis. Electronically Signed   By: Boyce Byes M.D.   On: 06/05/2023 16:25       ASSESSMENT & PLAN:   33 y.o. very pleasant male with  1. 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 -Cytology done 07/13/2021 shows presence of malignant cells in pancreas and poorly differentiated/high grade neuroendocrine carcinoma.  2.  Recent hospitalization from pneumonia likely related to mycoplasma.  Respiratory viral panel showed rhinovirus/enterovirus.  3.  Hyperpigmentation from 5-FU chemotherapy  4.  Financial and work-related stressors.  PLAN:  -discussed that next-line treatments would likely be Temodar and potentially a second medication, Capecitabine  -CT chest/abdm/pelvis scan -Mass in the head of the pancreas without significant change. Pancreatic parenchymal atrophy and pancreatic ductal dilatation is also present. 2. Stent in the common bile duct with associated pneumobilia. No  bile duct dilatation. 3. Prominent lymph nodes in the upper abdominal and retroperitoneal regions, likely metastatic, unchanged. 4. No evidence of metastatic disease in the chest. -cannot continue FOLFIRI due to liver toxicitiy despite dose reductions FOLLOW-UP: Rpt labs in 10-12 days -- once LFTs improved will switch to Xeloda + Temodar.  The total time spent in the appointment was 20 minutes* .  All of the patient's questions were answered with apparent satisfaction. The patient knows to call the clinic with any problems, questions or concerns.   Xzavian Semmel  MD MS AAHIVMS [facility] Hematology/Oncology Physician [facility]  .*Total Encounter Time as defined by the Centers for Medicare and Medicaid Services includes, in addition to the face-to-face time of a patient visit (documented in the note above) non-face-to-face time: obtaining and reviewing outside history, ordering and reviewing medications, tests or procedures, care coordination (communications with other health care professionals or caregivers) and documentation in the medical record.    I,Mitra Faeizi,acting as a Neurosurgeon for Jacquelyn Matt, MD.,have documented all relevant documentation on the behalf of Jacquelyn Matt, MD,as directed by  Jacquelyn Matt, MD while in the presence of Jacquelyn Matt, MD.  .I have reviewed the above documentation for accuracy and completeness, and I agree with the above. Leon Israel MD

## 2023-06-11 ENCOUNTER — Inpatient Hospital Stay: Attending: Hematology | Admitting: Hematology

## 2023-06-11 DIAGNOSIS — C7A1 Malignant poorly differentiated neuroendocrine tumors: Secondary | ICD-10-CM | POA: Insufficient documentation

## 2023-06-11 DIAGNOSIS — R7989 Other specified abnormal findings of blood chemistry: Secondary | ICD-10-CM | POA: Diagnosis not present

## 2023-06-11 DIAGNOSIS — C7B8 Other secondary neuroendocrine tumors: Secondary | ICD-10-CM | POA: Insufficient documentation

## 2023-06-17 ENCOUNTER — Encounter: Payer: Self-pay | Admitting: Hematology

## 2023-06-18 ENCOUNTER — Emergency Department (HOSPITAL_COMMUNITY)

## 2023-06-18 ENCOUNTER — Observation Stay (HOSPITAL_COMMUNITY)
Admission: EM | Admit: 2023-06-18 | Discharge: 2023-06-20 | Disposition: A | Discharge status: Home / Self Care | Attending: Internal Medicine | Admitting: Internal Medicine

## 2023-06-18 ENCOUNTER — Other Ambulatory Visit: Payer: Self-pay

## 2023-06-18 ENCOUNTER — Telehealth: Payer: Self-pay | Admitting: *Deleted

## 2023-06-18 ENCOUNTER — Encounter (HOSPITAL_COMMUNITY): Payer: Self-pay

## 2023-06-18 ENCOUNTER — Inpatient Hospital Stay

## 2023-06-18 DIAGNOSIS — R932 Abnormal findings on diagnostic imaging of liver and biliary tract: Secondary | ICD-10-CM | POA: Insufficient documentation

## 2023-06-18 DIAGNOSIS — J45909 Unspecified asthma, uncomplicated: Secondary | ICD-10-CM | POA: Insufficient documentation

## 2023-06-18 DIAGNOSIS — R7989 Other specified abnormal findings of blood chemistry: Secondary | ICD-10-CM

## 2023-06-18 DIAGNOSIS — Z4659 Encounter for fitting and adjustment of other gastrointestinal appliance and device: Secondary | ICD-10-CM | POA: Diagnosis not present

## 2023-06-18 DIAGNOSIS — D3A8 Other benign neuroendocrine tumors: Secondary | ICD-10-CM

## 2023-06-18 DIAGNOSIS — F17210 Nicotine dependence, cigarettes, uncomplicated: Secondary | ICD-10-CM | POA: Diagnosis not present

## 2023-06-18 DIAGNOSIS — Z95828 Presence of other vascular implants and grafts: Secondary | ICD-10-CM

## 2023-06-18 DIAGNOSIS — Z8589 Personal history of malignant neoplasm of other organs and systems: Secondary | ICD-10-CM | POA: Diagnosis not present

## 2023-06-18 DIAGNOSIS — Z79899 Other long term (current) drug therapy: Secondary | ICD-10-CM | POA: Insufficient documentation

## 2023-06-18 DIAGNOSIS — R17 Unspecified jaundice: Secondary | ICD-10-CM | POA: Diagnosis not present

## 2023-06-18 DIAGNOSIS — K8050 Calculus of bile duct without cholangitis or cholecystitis without obstruction: Secondary | ICD-10-CM | POA: Insufficient documentation

## 2023-06-18 LAB — I-STAT CHEM 8, ED
BUN: 6 mg/dL (ref 6–20)
Calcium, Ion: 1.16 mmol/L (ref 1.15–1.40)
Chloride: 106 mmol/L (ref 98–111)
Creatinine, Ser: 0.8 mg/dL (ref 0.61–1.24)
Glucose, Bld: 103 mg/dL — ABNORMAL HIGH (ref 70–99)
HCT: 34 % — ABNORMAL LOW (ref 39.0–52.0)
Hemoglobin: 11.6 g/dL — ABNORMAL LOW (ref 13.0–17.0)
Potassium: 3.6 mmol/L (ref 3.5–5.1)
Sodium: 140 mmol/L (ref 135–145)
TCO2: 24 mmol/L (ref 22–32)

## 2023-06-18 LAB — CBC
HCT: 35.6 % — ABNORMAL LOW (ref 39.0–52.0)
Hemoglobin: 11.3 g/dL — ABNORMAL LOW (ref 13.0–17.0)
MCH: 29 pg (ref 26.0–34.0)
MCHC: 31.7 g/dL (ref 30.0–36.0)
MCV: 91.5 fL (ref 80.0–100.0)
Platelets: 251 10*3/uL (ref 150–400)
RBC: 3.89 MIL/uL — ABNORMAL LOW (ref 4.22–5.81)
RDW: 16.5 % — ABNORMAL HIGH (ref 11.5–15.5)
WBC: 5.2 10*3/uL (ref 4.0–10.5)
nRBC: 0 % (ref 0.0–0.2)

## 2023-06-18 LAB — COMPREHENSIVE METABOLIC PANEL WITH GFR
ALT: 145 U/L — ABNORMAL HIGH (ref 0–44)
AST: 156 U/L — ABNORMAL HIGH (ref 15–41)
Albumin: 3.4 g/dL — ABNORMAL LOW (ref 3.5–5.0)
Alkaline Phosphatase: 688 U/L — ABNORMAL HIGH (ref 38–126)
Anion gap: 6 (ref 5–15)
BUN: 9 mg/dL (ref 6–20)
CO2: 27 mmol/L (ref 22–32)
Calcium: 9.3 mg/dL (ref 8.9–10.3)
Chloride: 104 mmol/L (ref 98–111)
Creatinine, Ser: 0.57 mg/dL — ABNORMAL LOW (ref 0.61–1.24)
GFR, Estimated: >60 mL/min (ref >60)
Glucose, Bld: 102 mg/dL — ABNORMAL HIGH (ref 70–99)
Potassium: 3.7 mmol/L (ref 3.5–5.1)
Sodium: 137 mmol/L (ref 135–145)
Total Bilirubin: 9.7 mg/dL — ABNORMAL HIGH (ref 0.0–1.2)
Total Protein: 8.4 g/dL — ABNORMAL HIGH (ref 6.5–8.1)

## 2023-06-18 LAB — CBC WITH DIFFERENTIAL (CANCER CENTER ONLY)
Abs Immature Granulocytes: 0.02 10*3/uL (ref 0.00–0.07)
Basophils Absolute: 0 10*3/uL (ref 0.0–0.1)
Basophils Relative: 0 %
Eosinophils Absolute: 0.2 10*3/uL (ref 0.0–0.5)
Eosinophils Relative: 5 %
HCT: 33.6 % — ABNORMAL LOW (ref 39.0–52.0)
Hemoglobin: 11.3 g/dL — ABNORMAL LOW (ref 13.0–17.0)
Immature Granulocytes: 0 %
Lymphocytes Relative: 9 %
Lymphs Abs: 0.4 10*3/uL — ABNORMAL LOW (ref 0.7–4.0)
MCH: 29.3 pg (ref 26.0–34.0)
MCHC: 33.6 g/dL (ref 30.0–36.0)
MCV: 87 fL (ref 80.0–100.0)
Monocytes Absolute: 0.5 10*3/uL (ref 0.1–1.0)
Monocytes Relative: 11 %
Neutro Abs: 3.4 10*3/uL (ref 1.7–7.7)
Neutrophils Relative %: 75 %
Platelet Count: 226 10*3/uL (ref 150–400)
RBC: 3.86 MIL/uL — ABNORMAL LOW (ref 4.22–5.81)
RDW: 16.4 % — ABNORMAL HIGH (ref 11.5–15.5)
WBC Count: 4.6 10*3/uL (ref 4.0–10.5)
nRBC: 0 % (ref 0.0–0.2)

## 2023-06-18 LAB — CMP (CANCER CENTER ONLY)
ALT: 138 U/L — ABNORMAL HIGH (ref 0–44)
AST: 143 U/L — ABNORMAL HIGH (ref 15–41)
Albumin: 3.9 g/dL (ref 3.5–5.0)
Alkaline Phosphatase: 682 U/L — ABNORMAL HIGH (ref 38–126)
Anion gap: 8 (ref 5–15)
BUN: 10 mg/dL (ref 6–20)
CO2: 26 mmol/L (ref 22–32)
Calcium: 9.3 mg/dL (ref 8.9–10.3)
Chloride: 103 mmol/L (ref 98–111)
Creatinine: 0.88 mg/dL (ref 0.61–1.24)
GFR, Estimated: >60 mL/min (ref >60)
Glucose, Bld: 139 mg/dL — ABNORMAL HIGH (ref 70–99)
Potassium: 3.4 mmol/L — ABNORMAL LOW (ref 3.5–5.1)
Sodium: 137 mmol/L (ref 135–145)
Total Bilirubin: 10.2 mg/dL (ref 0.0–1.2)
Total Protein: 7.9 g/dL (ref 6.5–8.1)

## 2023-06-18 LAB — BILIRUBIN, FRACTIONATED(TOT/DIR/INDIR)
Bilirubin, Direct: 5.5 mg/dL — ABNORMAL HIGH (ref 0.0–0.2)
Indirect Bilirubin: 4.2 mg/dL — ABNORMAL HIGH (ref 0.3–0.9)
Total Bilirubin: 9.7 mg/dL — ABNORMAL HIGH (ref 0.0–1.2)

## 2023-06-18 MED ORDER — HEPARIN SOD (PORK) LOCK FLUSH 100 UNIT/ML IV SOLN
250.0000 [IU] | Freq: Once | INTRAVENOUS | Status: AC
Start: 2023-06-18 — End: 2023-06-18
  Administered 2023-06-18: 250 [IU]

## 2023-06-18 MED ORDER — ENOXAPARIN SODIUM 40 MG/0.4ML IJ SOSY
40.0000 mg | PREFILLED_SYRINGE | INTRAMUSCULAR | Status: DC
  Administered 2023-06-18: 40 mg via SUBCUTANEOUS
  Filled 2023-06-18: qty 0.4

## 2023-06-18 MED ORDER — SENNOSIDES-DOCUSATE SODIUM 8.6-50 MG PO TABS: 1.0000 | ORAL_TABLET | Freq: Every evening | ORAL | Status: DC | PRN

## 2023-06-18 MED ORDER — ONDANSETRON HCL 4 MG/2ML IJ SOLN: 4.0000 mg | Freq: Four times a day (QID) | INTRAMUSCULAR | Status: DC | PRN

## 2023-06-18 MED ORDER — ACETAMINOPHEN 325 MG PO TABS: 650.0000 mg | ORAL_TABLET | Freq: Four times a day (QID) | ORAL | Status: DC | PRN

## 2023-06-18 MED ORDER — ONDANSETRON HCL 4 MG PO TABS: 4.0000 mg | ORAL_TABLET | Freq: Four times a day (QID) | ORAL | Status: DC | PRN

## 2023-06-18 MED ORDER — ACETAMINOPHEN 650 MG RE SUPP: 650.0000 mg | Freq: Four times a day (QID) | RECTAL | Status: DC | PRN

## 2023-06-18 MED ORDER — IOHEXOL 300 MG/ML  SOLN
100.0000 mL | Freq: Once | INTRAMUSCULAR | Status: AC | PRN
  Administered 2023-06-18: 100 mL via INTRAVENOUS

## 2023-06-18 MED ORDER — SODIUM CHLORIDE 0.9% FLUSH
10.0000 mL | Freq: Once | INTRAVENOUS | Status: AC
  Administered 2023-06-18: 10 mL

## 2023-06-18 NOTE — Telephone Encounter (Signed)
 CRITICAL VALUE STICKER  CRITICAL VALUE: T Bili 10.2  RECEIVER (on-site recipient of call):Beth Griffen Frayne, RN  DATE & TIME NOTIFIED: 06/18/23 2:47pm  MESSENGER (representative from lab): Amber  MD NOTIFIED: Dr. Salomon Cree  TIME OF NOTIFICATION:2:57 pm  RESPONSE:  acknowledged receipt of lab results

## 2023-06-18 NOTE — H&P (Signed)
 History and Physical  Leon Fischer. ZOX:[id number] DOB: 12-25-1990 DOA: 06/18/2023  PCP: Patient, No Pcp Per   Chief Complaint: Elevated bilirubin   HPI: Leon Fischer. is a 33 y.o. 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  Patient currently on every 2 weeks chemotherapy since cancer diagnosis 2 weeks ago.  He did not get his last chemotherapy 2 weeks ago due to elevated LFTs.  His oncologist planned to switch him to Xeloda plus Temodar  once LFTs improved due to liver toxicity from FOLFIRI despite dose reduction. Today, he received phone call from his cancer center that he has elevated bilirubin and should present to the ED for further evaluation.  Patient feels well overall but has noticed his urine has become slightly darker his eyes has become slightly yellow.  He denies any abdominal pain, nausea, vomiting, fatigue, hematuria, bloody stools, fevers or chills.  ED Course: Initial vitals show patient hypertensive with BP of 152/103 but otherwise afebrile and stable.  Initial labs significant for sodium 137, K+ 3.7, creatinine 0.57, AST/ALT 156/145, alk phos 688, bilirubin 9.7 (10.2 earlier today, 3.4 about 2 weeks ago), WBC 5.2, Hgb 11.3, platelet 251.  CT A/P shows stable pancreatic head mass and CBD stent with mild pneumobilia.  GI was consulted for evaluation.  TRH was consulted for admission.  Review of Systems: Please see HPI for pertinent positives and negatives. A complete 10 system review of systems are otherwise negative.  Past Medical History:  Diagnosis Date   Asthma    Neuroendocrine cancer [facility])    Past Surgical History:  Procedure Laterality Date   BILIARY DILATION  07/13/2021   Procedure: BILIARY DILATION;  Surgeon: Ozell Blunt, MD;  Location: [facility];  Service: Gastroenterology;;   BILIARY  STENT PLACEMENT N/A 07/13/2021   Procedure: BILIARY STENT PLACEMENT;  Surgeon: Ozell Blunt, MD;  Location: [name];  Service: Gastroenterology;  Laterality: N/A;   BILIARY STENT PLACEMENT N/A 11/05/2021   Procedure: BILIARY STENT PLACEMENT;  Surgeon: Ozell Blunt, MD;  Location: [name];  Service: Gastroenterology;  Laterality: N/A;   BIOPSY  07/13/2021   Procedure: BIOPSY;  Surgeon: Brice Campi Albino Alu., MD;  Location: [facility];  Service: Gastroenterology;;   CHOLECYSTECTOMY N/A 11/07/2021   Procedure: LAPAROSCOPIC CHOLECYSTECTOMY;  Surgeon: Adalberto Acton, MD;  Location: [facility];  Service: General;  Laterality: N/A;   ENDOSCOPIC RETROGRADE CHOLANGIOPANCREATOGRAPHY (ERCP) WITH PROPOFOL  N/A 07/13/2021   Procedure: ENDOSCOPIC RETROGRADE CHOLANGIOPANCREATOGRAPHY (ERCP) WITH PROPOFOL ;  Surgeon: Ozell Blunt, MD;  Location: [name];  Service: Gastroenterology;  Laterality: N/A;   ERCP N/A 11/05/2021   Procedure: ENDOSCOPIC RETROGRADE CHOLANGIOPANCREATOGRAPHY (ERCP);  Surgeon: Ozell Blunt, MD;  Location: [facility];  Service: Gastroenterology;  Laterality: N/A;   ESOPHAGOGASTRODUODENOSCOPY (EGD) WITH PROPOFOL  N/A 07/13/2021   Procedure: ESOPHAGOGASTRODUODENOSCOPY (EGD) WITH PROPOFOL ;  Surgeon: Brice Campi Albino Alu., MD;  Location: [name];  Service: Gastroenterology;  Laterality: N/A;   EUS N/A 07/13/2021   Procedure: UPPER ENDOSCOPIC ULTRASOUND (EUS) LINEAR;  Surgeon: Normie Becton., MD;  Location: [name];  Service: Gastroenterology;  Laterality: N/A;   FINE NEEDLE ASPIRATION  07/13/2021   Procedure: FINE NEEDLE ASPIRATION (FNA) LINEAR;  Surgeon: Normie Becton., MD;  Location: [facility];  Service: Gastroenterology;;   IR IMAGING GUIDED PORT INSERTION  07/31/2021   SPHINCTEROTOMY  07/13/2021   Procedure: [address];  Surgeon: Ozell Blunt, MD;  Location: [name];  Service: Gastroenterology;;   Social History:  reports that he has been smoking  cigarettes. He has never used smokeless tobacco. He reports current alcohol use. He reports that he does not use drugs.  Allergies  Allergen Reactions   Shellfish Allergy Anaphylaxis, Shortness Of Breath, Swelling and Other (See Comments)    Eyes, throat swell shut, and the face swells    Family History  Problem Relation Age of Onset   Drug abuse Father      Prior to Admission medications   Medication Sig Start Date End Date Taking? Authorizing Provider  acetaminophen  (TYLENOL ) 500 MG tablet Take 1,000 mg by mouth every 6 (six) hours as needed for mild pain.   Yes [provider]  ondansetron  (ZOFRAN ) 8 MG tablet Take 1 tablet (8 mg total) by mouth every 8 (eight) hours as needed for nausea, vomiting or refractory nausea / vomiting. Start on the third day after chemotherapy. 07/24/22  Yes [name], MD  albuterol  (VENTOLIN  HFA) 108 [phone] Base) MCG/ACT inhaler Inhale 1-2 puffs into the lungs every 6 (six) hours as needed for wheezing or shortness of breath. 01/23/23   Hongalgi, Anand D, MD  dexamethasone  (DECADRON ) 4 MG tablet Take 2 tablets (8 mg total) by mouth daily. Start the day after chemotherapy for 2 days. Take with food. 09/25/22   [name], MD  lidocaine  (LIDODERM ) 5 % Place 1 patch onto the skin daily. Remove & Discard patch within 12 hours or as directed by MD 03/22/23   [place] Butter, PA  methocarbamol  (ROBAXIN ) 500 MG tablet Take 1 tablet (500 mg total) by mouth 2 (two) times daily. 03/22/23   [place] Butter, PA  predniSONE  (DELTASONE ) 10 MG tablet Take 3 tabs (30 mg total) daily x 2 days, then 2 tabs (20 mg total) daily x 2 days, then 1 tab (10 mg total) daily x 2 days, then stop. 01/24/23   Hongalgi, Anand D, MD  prochlorperazine  (COMPAZINE ) 10 MG tablet Take 1 tablet (10 mg total) by mouth every 6 (six) hours as needed for nausea or vomiting. 07/24/22   [name], MD    Physical Exam: BP (!) 152/103   Pulse 95   Temp 98.2 F (36.8  C) (Oral)   Resp 18   Ht 6' (1.829 m)   Wt (!) 137 kg   SpO2 97%   BMI 40.96 kg/m  General: Pleasant, well-appearing young man sitting in bed. No acute distress. HEENT: [place]/AT. Mild icteric sclera.  CV: RRR. No murmurs, rubs, or gallops. No LE edema Pulmonary: Lungs CTAB. Normal effort. No wheezing or rales. Abdominal: Soft, nontender, nondistended. Normal bowel sounds. Extremities: Palpable radial and DP pulses. Normal ROM. Skin: Warm and dry. No obvious rash or lesions. Neuro: A&Ox3. Moves all extremities. Normal sensation to light touch. No focal deficit. Psych: Normal mood and affect          Labs on Admission:  Basic Metabolic Panel: Recent Labs  Lab 06/18/23 1349 06/18/23 1913 06/18/23 1939  NA 137 137 140  K 3.4* 3.7 3.6  CL 103 104 106  CO2 26 27  --   GLUCOSE 139* 102* 103*  BUN 10 9 6   CREATININE 0.88 0.57* 0.80  CALCIUM  9.3 9.3  --    Liver Function Tests: Recent Labs  Lab 06/18/23 1349 06/18/23 1913  AST 143* 156*  ALT 138* 145*  ALKPHOS 682* 688*  BILITOT 10.2* 9.7*  PROT 7.9 8.4*  ALBUMIN 3.9 3.4*  No results for input(s): "LIPASE", "AMYLASE" in the last 168 hours. No results for input(s): "AMMONIA" in the last 168 hours. CBC: Recent Labs  Lab 06/18/23 1349 06/18/23 1913 06/18/23 1939  WBC 4.6 5.2  --   NEUTROABS 3.4  --   --   HGB 11.3* 11.3* 11.6*  HCT 33.6* 35.6* 34.0*  MCV 87.0 91.5  --   PLT 226 251  --    Cardiac Enzymes: No results for input(s): "CKTOTAL", "CKMB", "CKMBINDEX", "TROPONINI" in the last 168 hours. BNP (last 3 results) No results for input(s): "BNP" in the last 8760 hours.  ProBNP (last 3 results) No results for input(s): "PROBNP" in the last 8760 hours.  CBG: No results for input(s): "GLUCAP" in the last 168 hours.  Radiological Exams on Admission: CT ABDOMEN PELVIS W CONTRAST Result Date: 06/18/2023 CLINICAL DATA:  Elevated bilirubin. EXAM: CT ABDOMEN AND PELVIS WITH CONTRAST TECHNIQUE: Multidetector CT  imaging of the abdomen and pelvis was performed using the standard protocol following bolus administration of intravenous contrast. RADIATION DOSE REDUCTION: This exam was performed according to the departmental dose-optimization program which includes automated exposure control, adjustment of the mA and/or kV according to patient size and/or use of iterative reconstruction technique. CONTRAST:  OMNIPAQUE  IOHEXOL  300 MG/ML  SOLN COMPARISON:  None Available. FINDINGS: Lower chest: Mild atelectatic changes are seen within the posterior aspect of the right lung base. Hepatobiliary: No focal liver abnormality is seen. Mild pneumobilia is noted. Status post cholecystectomy. A common bile duct stent is in place. Pancreas: A stable 4.6 cm mass is noted within the pancreatic head with atrophic changes seen involving the pancreatic body and tail. There is stable pancreatic ductal dilatation. Spleen: The spleen is enlarged and unchanged in size. Adrenals/Urinary Tract: Adrenal glands are unremarkable. Kidneys are normal, without renal calculi, focal lesion, or hydronephrosis. Bladder is unremarkable. Stomach/Bowel: Stomach is within normal limits. Appendix appears normal. There is mild to moderate severity thickening of the proximal duodenum. No evidence of bowel dilatation. Mild mesenteric edema is seen and unchanged in severity when compared to the prior study Vascular/Lymphatic: Numerous tortuous varices are seen within the mid and upper abdomen. Stable celiac and peripancreatic lymphadenopathy is seen. Multiple subcentimeter para-aortic and aortocaval lymph nodes are also noted. Reproductive: Prostate is unremarkable. Other: No abdominal wall hernia or abnormality. No abdominopelvic ascites. Musculoskeletal: No acute or significant osseous findings. IMPRESSION: 1. Stable pancreatic head mass with atrophic changes involving the pancreatic body and tail. 2. Common bile duct stent in place with mild pneumobilia. 3.  Stable celiac and peripancreatic lymphadenopathy. 4. Stable splenomegaly. 5. Mild to moderate severity thickening of the proximal duodenum which may represent duodenitis. 6. Stable mesenteric edema. Electronically Signed   By: Virgle Grime M.D.   On: 06/18/2023 20:33   Assessment/Plan Leon Fischer. is a 33 y.o. 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  # Elevated bilirubin # Elevated LFTs - Progressive worsening of LFTs and bilirubin since 2 weeks ago - Bilirubin with significant rise from 3.4 2 weeks ago to 10.2->9.7 - LFTs relatively unchanged compared to 2 weeks ago - Patient with no abdominal symptoms or tenderness on exam - CT A/P shows CBD stent stable in place with mild pneumobilia - GI consulted, appreciate recs - Follow-up MRCP - Check fractionated bilirubin and trend LFTs  # Metastatic  neuroendocrine tumor - Diagnosed in June 2023 with cytology showing malignant cells in pancreas and poorly differentiated/high-grade neuroendocrine carcinoma - Currently on every 2 weeks chemotherapy with FOLFIRI - Plan to switch to Xeloda plus Temodar due to liver toxicity from [facility] - Oncologist, Dr. Salomon Cree, added to care team  # Elevated blood pressure - BP elevated to 152/103 - Denies any history of hypertension - CTM  # Class III obesity Body mass index is 40.96 kg/m. Filed Weights   06/18/23 1723  Weight: (!) 137 kg  - Follow-up with PCP for nutrition and weight loss counseling   DVT prophylaxis: Lovenox      Code Status: Full Code  Consults called: GI  Family Communication: No family at bedside  Severity of Illness: The appropriate patient status for this patient is OBSERVATION. Observation status is judged to be reasonable and necessary in  order to provide the required intensity of service to ensure the patient's safety. The patient's presenting symptoms, physical exam findings, and initial radiographic and laboratory data in the context of their medical condition is felt to place them at decreased risk for further clinical deterioration. Furthermore, it is anticipated that the patient will be medically stable for discharge from the hospital within 2 midnights of admission.   Level of care: Med-Surg   This record has been created using Conservation officer, historic buildings. Errors have been sought and corrected, but may not always be located. Such creation errors do not reflect on the standard of care.   Vita Grip, MD 06/18/2023, 9:44 PM [facility] Pager: [phone] Isaiah 41:10   If 7PM-7AM, please contact night-coverage [email or web address] Password [email or web address]

## 2023-06-18 NOTE — ED Provider Notes (Signed)
 [place] EMERGENCY DEPARTMENT AT [facility] Provider Note   CSN: [id number] Arrival date & time: 06/18/23  1714     History  Chief Complaint  Patient presents with   Abnormal Lab    Leon Fischer. is a 33 y.o. male.  34 year old male presenting emergency department for evaluation of elevated bilirubin.  Has neuroendocrine cancer and on chemotherapy last chemo was roughly a month ago per his report.  Reports he is having no symptoms and otherwise feels like his normal self.  No nausea no vomiting.  No abdominal pain.  Reports prior cholecystectomy.   Abnormal Lab      Home Medications Prior to Admission medications   Medication Sig Start Date End Date Taking? Authorizing Provider  acetaminophen  (TYLENOL ) 500 MG tablet Take 1,000 mg by mouth every 6 (six) hours as needed for mild pain.   Yes [provider]  albuterol  (VENTOLIN  HFA) 108 (90 Base) MCG/ACT inhaler Inhale 1-2 puffs into the lungs every 6 (six) hours as needed for wheezing or shortness of breath. 01/23/23  Yes Hongalgi, Anand D, MD  methocarbamol  (ROBAXIN ) 500 MG tablet Take 1 tablet (500 mg total) by mouth 2 (two) times daily. Patient taking differently: Take 500 mg by mouth 2 (two) times daily as needed for muscle spasms. 03/22/23  Yes Neil Balls A, PA  ondansetron  (ZOFRAN ) 8 MG tablet Take 1 tablet (8 mg total) by mouth every 8 (eight) hours as needed for nausea, vomiting or refractory nausea / vomiting. Start on the third day after chemotherapy. 07/24/22  Yes [name], MD  prochlorperazine  (COMPAZINE ) 10 MG tablet Take 1 tablet (10 mg total) by mouth every 6 (six) hours as needed for nausea or vomiting. 07/24/22  Yes [name], MD  dexamethasone  (DECADRON ) 4 MG tablet Take 2 tablets (8 mg total) by mouth daily. Start the day after chemotherapy for 2 days. Take with food. Patient not taking: Reported on 06/18/2023 09/25/22   [name], MD  lidocaine   (LIDODERM ) 5 % Place 1 patch onto the skin daily. Remove & Discard patch within 12 hours or as directed by MD Patient not taking: Reported on 06/18/2023 03/22/23   Viola Butter, PA  predniSONE  (DELTASONE ) 10 MG tablet Take 3 tabs (30 mg total) daily x 2 days, then 2 tabs (20 mg total) daily x 2 days, then 1 tab (10 mg total) daily x 2 days, then stop. Patient not taking: Reported on 06/18/2023 01/24/23   Casey Clay, MD      Allergies    Shellfish allergy    Review of Systems   Review of Systems  Physical Exam Updated Vital Signs BP 116/87 (BP Location: Right Arm)   Pulse 81   Temp 98.2 F (36.8 C) (Oral)   Resp 18   Ht 6' (1.829 m)   Wt (!) 137 kg   SpO2 100%   BMI 40.96 kg/m  Physical Exam Vitals and nursing note reviewed.  HENT:     Head: Normocephalic.     Nose: Nose normal.     Mouth/Throat:     Mouth: Mucous membranes are moist.  Eyes:     General: Scleral icterus present.  Cardiovascular:     Rate and Rhythm: Normal rate and regular rhythm.  Pulmonary:     Effort: Pulmonary effort is normal.     Breath sounds: Normal breath sounds.  Abdominal:     General: Abdomen is flat. There is no distension.  Palpations: Abdomen is soft.     Tenderness: There is no abdominal tenderness. There is no guarding or rebound.  Musculoskeletal:        General: Normal range of motion.  Skin:    General: Skin is warm.     Capillary Refill: Capillary refill takes less than 2 seconds.  Neurological:     Mental Status: He is alert and oriented to person, place, and time.  Psychiatric:        Mood and Affect: Mood normal.        Behavior: Behavior normal.     ED Results / Procedures / Treatments   Labs (all labs ordered are listed, but only abnormal results are displayed) Labs Reviewed  CBC - Abnormal; Notable for the following components:      Result Value   RBC 3.89 (*)    Hemoglobin 11.3 (*)    HCT 35.6 (*)    RDW 16.5 (*)    All other components within  normal limits  COMPREHENSIVE METABOLIC PANEL WITH GFR - Abnormal; Notable for the following components:   Glucose, Bld 102 (*)    Creatinine, Ser 0.57 (*)    Total Protein 8.4 (*)    Albumin 3.4 (*)    AST 156 (*)    ALT 145 (*)    Alkaline Phosphatase 688 (*)    Total Bilirubin 9.7 (*)    All other components within normal limits  BILIRUBIN, FRACTIONATED(TOT/DIR/INDIR) - Abnormal; Notable for the following components:   Total Bilirubin 9.7 (*)    Bilirubin, Direct 5.5 (*)    Indirect Bilirubin 4.2 (*)    All other components within normal limits  I-STAT CHEM 8, ED - Abnormal; Notable for the following components:   Glucose, Bld 103 (*)    Hemoglobin 11.6 (*)    HCT 34.0 (*)    All other components within normal limits  COMPREHENSIVE METABOLIC PANEL WITH GFR  CBC    EKG None  Radiology CT ABDOMEN PELVIS W CONTRAST Result Date: 06/18/2023 CLINICAL DATA:  Elevated bilirubin. EXAM: CT ABDOMEN AND PELVIS WITH CONTRAST TECHNIQUE: Multidetector CT imaging of the abdomen and pelvis was performed using the standard protocol following bolus administration of intravenous contrast. RADIATION DOSE REDUCTION: This exam was performed according to the departmental dose-optimization program which includes automated exposure control, adjustment of the mA and/or kV according to patient size and/or use of iterative reconstruction technique. CONTRAST:  OMNIPAQUE  IOHEXOL  300 MG/ML  SOLN COMPARISON:  None Available. FINDINGS: Lower chest: Mild atelectatic changes are seen within the posterior aspect of the right lung base. Hepatobiliary: No focal liver abnormality is seen. Mild pneumobilia is noted. Status post cholecystectomy. A common bile duct stent is in place. Pancreas: A stable 4.6 cm mass is noted within the pancreatic head with atrophic changes seen involving the pancreatic body and tail. There is stable pancreatic ductal dilatation. Spleen: The spleen is enlarged and unchanged in size.  Adrenals/Urinary Tract: Adrenal glands are unremarkable. Kidneys are normal, without renal calculi, focal lesion, or hydronephrosis. Bladder is unremarkable. Stomach/Bowel: Stomach is within normal limits. Appendix appears normal. There is mild to moderate severity thickening of the proximal duodenum. No evidence of bowel dilatation. Mild mesenteric edema is seen and unchanged in severity when compared to the prior study Vascular/Lymphatic: Numerous tortuous varices are seen within the mid and upper abdomen. Stable celiac and peripancreatic lymphadenopathy is seen. Multiple subcentimeter para-aortic and aortocaval lymph nodes are also noted. Reproductive: Prostate is unremarkable. Other: No abdominal  wall hernia or abnormality. No abdominopelvic ascites. Musculoskeletal: No acute or significant osseous findings. IMPRESSION: 1. Stable pancreatic head mass with atrophic changes involving the pancreatic body and tail. 2. Common bile duct stent in place with mild pneumobilia. 3. Stable celiac and peripancreatic lymphadenopathy. 4. Stable splenomegaly. 5. Mild to moderate severity thickening of the proximal duodenum which may represent duodenitis. 6. Stable mesenteric edema. Electronically Signed   By: Virgle Grime M.D.   On: 06/18/2023 20:33    Procedures Procedures    Medications Ordered in ED Medications  enoxaparin  (LOVENOX ) injection 40 mg (40 mg Subcutaneous Given 06/18/23 2251)  acetaminophen  (TYLENOL ) tablet 650 mg (has no administration in time range)    Or  acetaminophen  (TYLENOL ) suppository 650 mg (has no administration in time range)  senna-docusate (Senokot-S) tablet 1 tablet (has no administration in time range)  ondansetron  (ZOFRAN ) tablet 4 mg (has no administration in time range)    Or  ondansetron  (ZOFRAN ) injection 4 mg (has no administration in time range)  iohexol  (OMNIPAQUE ) 300 MG/ML solution 100 mL (100 mLs Intravenous Contrast Given 06/18/23 2013)    ED Course/ Medical  Decision Making/ A&P Clinical Course as of 06/18/23 2353  Wed Jun 18, 2023  2037 CT ABDOMEN PELVIS W CONTRAST IMPRESSION: 1. Stable pancreatic head mass with atrophic changes involving the pancreatic body and tail. 2. Common bile duct stent in place with mild pneumobilia. 3. Stable celiac and peripancreatic lymphadenopathy. 4. Stable splenomegaly. 5. Mild to moderate severity thickening of the proximal duodenum which may represent duodenitis. 6. Stable mesenteric edema.   Electronically Signed   By: Virgle Grime M.D.   On: 06/18/2023 20:33   [TY]  2123 Spoke with Dr. Feliberto Hopping. Recommending admit for Mrcp, will see in consult.   [TY]    Clinical Course User Index [TY] Rolinda Climes, DO                                 Medical Decision Making 33 year old male presenting emergency department for elevated bilirubin.  Per review bilirubin outpatient was 10.  Benign abdominal exam, mild scleral icterus.  Will get repeat labs and ultrasound plus CT scan to evaluate for obstructive process.  T. bili is elevated, AST ALT appears to be similar to prior.  No leukocytosis stable anemia.  CT scan appears to have biliary stent that is well-placed.  Case discussed with GI recommending MRCP.  Will admit to hospitalist service for MRCP and GI consult.  Case discussed with hospitalist who agrees to see and admit patient.  Amount and/or Complexity of Data Reviewed Independent Historian:     Details: Mother notes has had elevated liver enzymes in the past External Data Reviewed:     Details: CT scan 06/05/2023 did have pancreatic mass. Labs: ordered. Decision-making details documented in ED Course. Radiology: ordered. Decision-making details documented in ED Course.  Risk Prescription drug management. Decision regarding hospitalization. Diagnosis or treatment significantly limited by social determinants of health.          Final Clinical Impression(s) / ED Diagnoses Final  diagnoses:  Elevated bilirubin    Rx / DC Orders ED Discharge Orders     None         Rolinda Climes, DO 06/18/23 2353

## 2023-06-18 NOTE — ED Triage Notes (Signed)
 Patient had labs drawn today. Was told his bilirubin was high, his doctor is worried he might have an obstruction. Has neuroendocrine cancer. Currently is doing chemo. Denies abdominal pain.

## 2023-06-18 NOTE — Telephone Encounter (Signed)
 Contacted patient per Dr.Kale with following info: His bilirubin is very elevated. The elevated bilirubin may be signs of developing biliary obstruction. He should go to ED for evaluation and possible ERCP. Leon Fischer verbalized understanding.

## 2023-06-19 ENCOUNTER — Observation Stay (HOSPITAL_COMMUNITY)

## 2023-06-19 DIAGNOSIS — R17 Unspecified jaundice: Secondary | ICD-10-CM | POA: Diagnosis not present

## 2023-06-19 LAB — COMPREHENSIVE METABOLIC PANEL WITH GFR
ALT: 157 U/L — ABNORMAL HIGH (ref 0–44)
AST: 178 U/L — ABNORMAL HIGH (ref 15–41)
Albumin: 3.1 g/dL — ABNORMAL LOW (ref 3.5–5.0)
Alkaline Phosphatase: 624 U/L — ABNORMAL HIGH (ref 38–126)
Anion gap: 8 (ref 5–15)
BUN: 7 mg/dL (ref 6–20)
CO2: 24 mmol/L (ref 22–32)
Calcium: 9 mg/dL (ref 8.9–10.3)
Chloride: 106 mmol/L (ref 98–111)
Creatinine, Ser: 0.65 mg/dL (ref 0.61–1.24)
GFR, Estimated: >60 mL/min (ref >60)
Glucose, Bld: 109 mg/dL — ABNORMAL HIGH (ref 70–99)
Potassium: 3.6 mmol/L (ref 3.5–5.1)
Sodium: 138 mmol/L (ref 135–145)
Total Bilirubin: 8.5 mg/dL — ABNORMAL HIGH (ref 0.0–1.2)
Total Protein: 7.7 g/dL (ref 6.5–8.1)

## 2023-06-19 LAB — CBC
HCT: 32.8 % — ABNORMAL LOW (ref 39.0–52.0)
Hemoglobin: 10.7 g/dL — ABNORMAL LOW (ref 13.0–17.0)
MCH: 29.8 pg (ref 26.0–34.0)
MCHC: 32.6 g/dL (ref 30.0–36.0)
MCV: 91.4 fL (ref 80.0–100.0)
Platelets: 210 10*3/uL (ref 150–400)
RBC: 3.59 MIL/uL — ABNORMAL LOW (ref 4.22–5.81)
RDW: 16.7 % — ABNORMAL HIGH (ref 11.5–15.5)
WBC: 4.4 10*3/uL (ref 4.0–10.5)
nRBC: 0 % (ref 0.0–0.2)

## 2023-06-19 MED ORDER — GADOBUTROL 1 MMOL/ML IV SOLN
10.0000 mL | Freq: Once | INTRAVENOUS | Status: AC | PRN
  Administered 2023-06-19: 10 mL via INTRAVENOUS

## 2023-06-19 MED ORDER — SODIUM CHLORIDE 0.9% FLUSH: 10.0000 mL | INTRAVENOUS | Status: DC | PRN

## 2023-06-19 MED ORDER — PIPERACILLIN-TAZOBACTAM 3.375 G IVPB 30 MIN: 3.3750 g | INTRAVENOUS | Status: DC

## 2023-06-19 MED ORDER — CHLORHEXIDINE GLUCONATE CLOTH 2 % EX PADS
6.0000 | MEDICATED_PAD | Freq: Every day | CUTANEOUS | Status: DC
Start: 2023-06-19 — End: 2023-06-20
  Administered 2023-06-19 – 2023-06-20 (×2): 6 via TOPICAL

## 2023-06-19 NOTE — Plan of Care (Signed)

## 2023-06-19 NOTE — TOC Progression Note (Signed)
 Transition of Care [facility]) - Progression Note    Patient Details  Name: Leon Fischer. MRN: [id number] Date of Birth: 1990/08/25  Transition of Care [facility]) CM/SW Contact  Kathryn Parish, RN Phone Number: 06/19/2023, 11:34 AM  Clinical Narrative:    NCM met with patient in room. Patient said PTA he's independent, lives in a house with mother, and drives self to appointments; refused offer for NCM to schedule a PCP appointment; insurance verified; No DME, HH, oxygen or SDOH needs; At discharge patients mother will transport home. No additional TOC needs identified during visit. TOC signing off. If TOC needs present, please enter consult.   Expected Discharge Plan: Home/Self Care Barriers to Discharge: No Barriers Identified  Expected Discharge Plan and Services   Discharge Planning Services: CM Consult Post Acute Care Choice: NA Living arrangements for the past 2 months: Single Family Home                 DME Arranged: N/A DME Agency: NA       HH Arranged: NA HH Agency: NA         Social Determinants of Health (SDOH) Interventions SDOH Screenings   Food Insecurity: No Food Insecurity (06/18/2023)  Housing: Low Risk  (06/18/2023)  Transportation Needs: No Transportation Needs (06/18/2023)  Utilities: Not At Risk (06/18/2023)  Financial Resource Strain: Low Risk  (04/14/2017)  Physical Activity: Inactive (04/14/2017)  Social Connections: Unknown (06/19/2021)   Received from [facility], [facility]  Stress: Stress Concern Present (04/14/2017)  Tobacco Use: High Risk (06/18/2023)    Readmission Risk Interventions    11/09/2021    9:57 AM  Readmission Risk Prevention Plan  Transportation Screening Complete  PCP or Specialist Appt within 5-7 Days Complete  Home Care Screening Complete  Medication Review (RN CM) Complete

## 2023-06-19 NOTE — Consult Note (Signed)
 Referring Provider: [facility] Primary Care Physician:  Patient, No Pcp Per Primary Gastroenterologist: Unassigned  Reason for Consultation: Jaundice, history of biliary stent  HPI: Leon Fischer. is a 34 y.o. 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  Patient is currently followed by oncology and currently on chemotherapy.  Was advised to come to the hospital from cancer center because of worsening LFTs with jaundice.  There was concern for liver toxicity from FOLFIRI.  Blood work yesterday showed T. bili of 10.2.  His bilirubin was normal on May 16, 2023 and 3.4 on June 04, 2023.  Mildly elevated AST ALT and alkaline phosphatase of 682.  Mostly indirect hyperbilirubinemia with indirect bilirubin of 4.2.  Normal CBC except for mild anemia with hemoglobin of 11.3.  CT abdomen pelvis with IV contrast showed stable pancreatic head mass with the CBD stent with mild pneumobilia.  Thickening of the duodenum probably from duodenitis.  Follow-up MRI MRCP showed multiple CBD stone within the common bile duct upstream of metallic stent.  Also showed multiple liver lesion consistent with metastatic disease.  Patient denies any GI symptoms Past Medical History:  Diagnosis Date   Asthma    Neuroendocrine cancer [facility])     Past Surgical History:  Procedure Laterality Date   BILIARY DILATION  07/13/2021   Procedure: BILIARY DILATION;  Surgeon: Ozell Blunt, MD;  Location: [facility];  Service: Gastroenterology;;   BILIARY STENT PLACEMENT N/A 07/13/2021   Procedure: BILIARY STENT PLACEMENT;  Surgeon: Ozell Blunt, MD;  Location: [name];  Service: Gastroenterology;  Laterality: N/A;   BILIARY STENT PLACEMENT N/A 11/05/2021   Procedure: BILIARY STENT PLACEMENT;  Surgeon: Ozell Blunt, MD;  Location: [name];  Service:  Gastroenterology;  Laterality: N/A;   BIOPSY  07/13/2021   Procedure: BIOPSY;  Surgeon: Brice Campi Albino Alu., MD;  Location: [facility];  Service: Gastroenterology;;   CHOLECYSTECTOMY N/A 11/07/2021   Procedure: LAPAROSCOPIC CHOLECYSTECTOMY;  Surgeon: Adalberto Acton, MD;  Location: [facility];  Service: General;  Laterality: N/A;   ENDOSCOPIC RETROGRADE CHOLANGIOPANCREATOGRAPHY (ERCP) WITH PROPOFOL  N/A 07/13/2021   Procedure: ENDOSCOPIC RETROGRADE CHOLANGIOPANCREATOGRAPHY (ERCP) WITH PROPOFOL ;  Surgeon: Ozell Blunt, MD;  Location: [name];  Service: Gastroenterology;  Laterality: N/A;   ERCP N/A 11/05/2021   Procedure: ENDOSCOPIC RETROGRADE CHOLANGIOPANCREATOGRAPHY (ERCP);  Surgeon: Ozell Blunt, MD;  Location: [facility];  Service: Gastroenterology;  Laterality: N/A;   ESOPHAGOGASTRODUODENOSCOPY (EGD) WITH PROPOFOL  N/A 07/13/2021   Procedure: ESOPHAGOGASTRODUODENOSCOPY (EGD) WITH PROPOFOL ;  Surgeon: Brice Campi Albino Alu., MD;  Location: [name];  Service: Gastroenterology;  Laterality: N/A;   EUS N/A 07/13/2021   Procedure: UPPER ENDOSCOPIC ULTRASOUND (EUS) LINEAR;  Surgeon: Normie Becton., MD;  Location: [name];  Service: Gastroenterology;  Laterality: N/A;   FINE NEEDLE ASPIRATION  07/13/2021   Procedure: FINE NEEDLE ASPIRATION (FNA) LINEAR;  Surgeon: Normie Becton., MD;  Location: [facility];  Service: Gastroenterology;;   IR IMAGING GUIDED PORT INSERTION  07/31/2021   SPHINCTEROTOMY  07/13/2021   Procedure: [address];  Surgeon: Ozell Blunt, MD;  Location: [name];  Service: Gastroenterology;;    Prior to Admission medications   Medication Sig Start Date End Date Taking? Authorizing Provider  acetaminophen  (TYLENOL ) 500 MG tablet Take 1,000 mg by mouth every 6 (six) hours as needed for mild pain.   Yes [provider]  albuterol  (VENTOLIN  HFA) 108 (90 Base)  MCG/ACT inhaler Inhale 1-2 puffs into the lungs every 6 (six) hours as needed for  wheezing or shortness of breath. 01/23/23  Yes Hongalgi, Anand D, MD  methocarbamol  (ROBAXIN ) 500 MG tablet Take 1 tablet (500 mg total) by mouth 2 (two) times daily. Patient taking differently: Take 500 mg by mouth 2 (two) times daily as needed for muscle spasms. 03/22/23  Yes Neil Balls A, PA  ondansetron  (ZOFRAN ) 8 MG tablet Take 1 tablet (8 mg total) by mouth every 8 (eight) hours as needed for nausea, vomiting or refractory nausea / vomiting. Start on the third day after chemotherapy. 07/24/22  Yes [name], MD  prochlorperazine  (COMPAZINE ) 10 MG tablet Take 1 tablet (10 mg total) by mouth every 6 (six) hours as needed for nausea or vomiting. 07/24/22  Yes [name], MD  dexamethasone  (DECADRON ) 4 MG tablet Take 2 tablets (8 mg total) by mouth daily. Start the day after chemotherapy for 2 days. Take with food. Patient not taking: Reported on 06/18/2023 09/25/22   [name], MD  lidocaine  (LIDODERM ) 5 % Place 1 patch onto the skin daily. Remove & Discard patch within 12 hours or as directed by MD Patient not taking: Reported on 06/18/2023 03/22/23   [place]ington Butter, PA  predniSONE  (DELTASONE ) 10 MG tablet Take 3 tabs (30 mg total) daily x 2 days, then 2 tabs (20 mg total) daily x 2 days, then 1 tab (10 mg total) daily x 2 days, then stop. Patient not taking: Reported on 06/18/2023 01/24/23   Casey Clay, MD    Scheduled Meds:  enoxaparin  (LOVENOX ) injection  40 mg Subcutaneous Q24H   Continuous Infusions: PRN Meds:.acetaminophen  **OR** acetaminophen , ondansetron  **OR** ondansetron  (ZOFRAN ) IV, senna-docusate  Allergies as of 06/18/2023 - Review Complete 06/18/2023  Allergen Reaction Noted   Shellfish allergy Anaphylaxis, Shortness Of Breath, Swelling, and Other (See Comments) 05/28/2011    Family History  Problem Relation Age of Onset   Drug abuse Father     Social History   Socioeconomic History   Marital status: Single    Spouse name: Not  on file   Number of children: 0   Years of education: Not on file   Highest education level: Some college, no degree  Occupational History   Not on file  Tobacco Use   Smoking status: Some Days    Types: Cigarettes   Smokeless tobacco: Never  Vaping Use   Vaping status: Never Used  Substance and Sexual Activity   Alcohol use: Yes    Comment: social   Drug use: No   Sexual activity: Never  Other Topics Concern   Not on file  Social History Narrative   Not on file   Social Drivers of Health   Financial Resource Strain: Low Risk  (04/14/2017)   Overall Financial Resource Strain (CARDIA)    Difficulty of Paying Living Expenses: Not hard at all  Food Insecurity: No Food Insecurity (06/18/2023)   Hunger Vital Sign    Worried About Running Out of Food in the Last Year: Never true    Ran Out of Food in the Last Year: Never true  Transportation Needs: No Transportation Needs (06/18/2023)   PRAPARE - Administrator, Civil Service (Medical): No    Lack of Transportation (Non-Medical): No  Physical Activity: Inactive (04/14/2017)   Exercise Vital Sign    Days of Exercise per Week: 0 days    Minutes of Exercise per Session: 0 min  Stress: Stress Concern Present (04/14/2017)   [name] of Occupational Health - Occupational Stress Questionnaire    Feeling of Stress : Rather much  Social Connections: Unknown (06/19/2021)   [facility]   Social Network    Social Network: Not on file  Intimate Partner Violence: Not At Risk (06/18/2023)   Humiliation, Afraid, Rape, and Kick questionnaire    Fear of Current or Ex-Partner: No    Emotionally Abused: No    Physically Abused: No    Sexually Abused: No    Review of Systems: All negative except as stated above in HPI.  Physical Exam: Vital signs: Vitals:   06/18/23 2221 06/19/23 0511  BP: 116/87 124/70  Pulse: 81 72  Resp:  18  Temp: 98.2 F (36.8 C) 98.1 F (36.7 C)  SpO2: 100% 99%    Last BM Date : 06/18/23 General:   Alert,  Well-developed, well-nourished, pleasant and cooperative in NAD Normocephalic, atraumatic, extraocular movement intact Sclera icterus noted Lungs: No visible respiratory distress Heart:  Regular rate and rhythm; no murmurs, clicks, rubs,  or gallops. Abdomen: Soft, nontender, nondistended, bowel sound present, no peritoneal signs Mood and affect normal Alert and oriented x 3 Rectal:  Deferred  GI:  Lab Results: Recent Labs    06/18/23 1349 06/18/23 1913 06/18/23 1939  WBC 4.6 5.2  --   HGB 11.3* 11.3* 11.6*  HCT 33.6* 35.6* 34.0*  PLT 226 251  --    BMET Recent Labs    06/18/23 1349 06/18/23 1913 06/18/23 1939  NA 137 137 140  K 3.4* 3.7 3.6  CL 103 104 106  CO2 26 27  --   GLUCOSE 139* 102* 103*  BUN 10 9 6   CREATININE 0.88 0.57* 0.80  CALCIUM  9.3 9.3  --    LFT Recent Labs    06/18/23 1913  PROT 8.4*  ALBUMIN 3.4*  AST 156*  ALT 145*  ALKPHOS 688*  BILITOT 9.7*  9.7*  BILIDIR 5.5*  IBILI 4.2*   PT/INR No results for input(s): "LABPROT", "INR" in the last 72 hours.   Studies/Results: CT ABDOMEN PELVIS W CONTRAST Result Date: 06/18/2023 CLINICAL DATA:  Elevated bilirubin. EXAM: CT ABDOMEN AND PELVIS WITH CONTRAST TECHNIQUE: Multidetector CT imaging of the abdomen and pelvis was performed using the standard protocol following bolus administration of intravenous contrast. RADIATION DOSE REDUCTION: This exam was performed according to the departmental dose-optimization program which includes automated exposure control, adjustment of the mA and/or kV according to patient size and/or use of iterative reconstruction technique. CONTRAST:  OMNIPAQUE  IOHEXOL  300 MG/ML  SOLN COMPARISON:  None Available. FINDINGS: Lower chest: Mild atelectatic changes are seen within the posterior aspect of the right lung base. Hepatobiliary: No focal liver abnormality is seen. Mild pneumobilia is noted. Status post cholecystectomy. A  common bile duct stent is in place. Pancreas: A stable 4.6 cm mass is noted within the pancreatic head with atrophic changes seen involving the pancreatic body and tail. There is stable pancreatic ductal dilatation. Spleen: The spleen is enlarged and unchanged in size. Adrenals/Urinary Tract: Adrenal glands are unremarkable. Kidneys are normal, without renal calculi, focal lesion, or hydronephrosis. Bladder is unremarkable. Stomach/Bowel: Stomach is within normal limits. Appendix appears normal. There is mild to moderate severity thickening of the proximal duodenum. No evidence of bowel dilatation. Mild mesenteric edema is seen and unchanged in severity when compared to the prior study Vascular/Lymphatic: Numerous tortuous varices are seen within the mid and upper abdomen.  Stable celiac and peripancreatic lymphadenopathy is seen. Multiple subcentimeter para-aortic and aortocaval lymph nodes are also noted. Reproductive: Prostate is unremarkable. Other: No abdominal wall hernia or abnormality. No abdominopelvic ascites. Musculoskeletal: No acute or significant osseous findings. IMPRESSION: 1. Stable pancreatic head mass with atrophic changes involving the pancreatic body and tail. 2. Common bile duct stent in place with mild pneumobilia. 3. Stable celiac and peripancreatic lymphadenopathy. 4. Stable splenomegaly. 5. Mild to moderate severity thickening of the proximal duodenum which may represent duodenitis. 6. Stable mesenteric edema. Electronically Signed   By: Virgle Grime M.D.   On: 06/18/2023 20:33    Impression/Plan: -Jaundice in a patient with known metastatic neuroendocrine tumor of pancreas with liver mets.  MRI MRCP showed multiple filling defect within common bile duct upstream of previously placed metallic stent.  Recommendations --------------------------- - Plan for ERCP tomorrow with Dr. Feliberto Hopping.  Okay to have soft diet today.  Keep n.p.o. past midnight.  Risks (post ERCP pancreatitis,  bleeding, infection, bowel perforation that could require surgery, sedation-related changes in cardiopulmonary systems), benefits (identification and possible treatment of source of symptoms, exclusion of certain causes of symptoms), and alternatives (watchful waiting, radiographic imaging studies, empiric medical treatment)  were explained to patient/family in detail and patient wishes to proceed.     LOS: 0 days   Felecia Hopper  MD, FACP 06/19/2023, 8:45 AM  Contact #  [phone]

## 2023-06-19 NOTE — Anesthesia Preprocedure Evaluation (Signed)
 Anesthesia Evaluation  Patient identified by MRN, date of birth, ID band Patient awake    Reviewed: Allergy & Precautions, NPO status , Patient's Chart, lab work & pertinent test results  History of Anesthesia Complications Negative for: history of anesthetic complications  Airway Mallampati: II  TM Distance: >3 FB Neck ROM: Full    Dental  (+) Dental Advisory Given   Pulmonary asthma , COPD,  COPD inhaler, Current Smoker and Patient abstained from smoking.   breath sounds clear to auscultation       Cardiovascular negative cardio ROS  Rhythm:Regular Rate:Normal     Neuro/Psych negative neurological ROS     GI/Hepatic ,neg GERD  ,,Markedly elevated LFTs Neuroendocrine tumor of pancreas   Endo/Other    Class 4 obesityBMI 41  Renal/GU negative Renal ROS     Musculoskeletal   Abdominal   Peds  Hematology  (+) Blood dyscrasia (Hb 10.7, plt 210k), anemia   Anesthesia Other Findings   Reproductive/Obstetrics                             Anesthesia Physical Anesthesia Plan  ASA: 3  Anesthesia Plan: General   Post-op Pain Management: Minimal or no pain anticipated   Induction: Intravenous  PONV Risk Score and Plan: 1 and Dexamethasone  and Ondansetron   Airway Management Planned: Oral ETT  Additional Equipment: None  Intra-op Plan:   Post-operative Plan: Extubation in OR  Informed Consent: I have reviewed the patients History and Physical, chart, labs and discussed the procedure including the risks, benefits and alternatives for the proposed anesthesia with the patient or authorized representative who has indicated his/her understanding and acceptance.     Dental advisory given  Plan Discussed with: CRNA and Surgeon  Anesthesia Plan Comments:         Anesthesia Quick Evaluation

## 2023-06-19 NOTE — Progress Notes (Signed)
 PROGRESS NOTE    Leon Fischer.  ZOX:[id number]  DOB: 04/11/1990  DOA: 06/18/2023 PCP: Patient, No Pcp Per Outpatient Specialists:   Hospital course:  33 y.o. 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  Patient is currently followed by oncology and currently on chemotherapy.  Was advised to come to the hospital from cancer center because of worsening LFTs with jaundice.  There was concern for liver toxicity from FOLFIRI.  Admission bilirubin was 10.2.  Patient was scheduled for an MRCP.  Subjective:  Patient is without complaints, denies any pain different from baseline.  Admits to some minimal pruritus   Objective: Vitals:   06/18/23 2202 06/18/23 2221 06/19/23 0511 06/19/23 1322  BP:  116/87 124/70 (!) 134/94  Pulse:  81 72 70  Resp:   18   Temp: 98.1 F (36.7 C) 98.2 F (36.8 C) 98.1 F (36.7 C) (!) 97.4 F (36.3 C)  TempSrc: Oral Oral Oral Oral  SpO2:  100% 99% 100%  Weight:      Height:       No intake or output data in the 24 hours ending 06/19/23 1610 Filed Weights   06/18/23 1723  Weight: (!) 137 kg     Exam:  General: Reasonably well-appearing patient in good spirits lying in bed in NAD Eyes: sclera anicteric, conjuctiva mild injection bilaterally CVS: S1-S2, regular  Respiratory:  decreased air entry bilaterally secondary to decreased inspiratory effort, rales at bases  GI: NABS, soft, NT  LE: Warm and well-perfused Neuro: A/O x 3,  grossly nonfocal.  Psych: patient is logical and coherent, judgement and insight appear normal, mood and affect appropriate to situation.  Data Reviewed:  Basic Metabolic Panel: Recent Labs  Lab 06/18/23 1349 06/18/23 1913 06/18/23 1939 06/19/23 0919  NA 137 137 140 138  K 3.4* 3.7 3.6 3.6  CL 103 104 106 106  CO2 26 27  --  24   GLUCOSE 139* 102* 103* 109*  BUN 10 9 6 7   CREATININE 0.88 0.57* 0.80 0.65  CALCIUM  9.3 9.3  --  9.0    CBC: Recent Labs  Lab 06/18/23 1349 06/18/23 1913 06/18/23 1939 06/19/23 0919  WBC 4.6 5.2  --  4.4  NEUTROABS 3.4  --   --   --   HGB 11.3* 11.3* 11.6* 10.7*  HCT 33.6* 35.6* 34.0* 32.8*  MCV 87.0 91.5  --  91.4  PLT 226 251  --  210     Scheduled Meds:  Chlorhexidine  Gluconate Cloth  6 each Topical Daily   enoxaparin  (LOVENOX ) injection  40 mg Subcutaneous Q24H   Continuous Infusions:  [START ON 06/20/2023] piperacillin -tazobactam       Assessment & Plan:   Abnormal LFTs with hyperbilirubinemia Neuroendocrine tumor with metastases to liver Treatment with FOLFIRI CT abdomen pelvis showed stable mass at pancreatic head MRCP done today shows multiple CBD stones upstream of metallic stent Liver also noted to have metastatic disease Appreciate GI consultation, plan is for ERCP tomorrow  Metastatic neuroendocrine tumor with brain mets Diagnosed in June 2023 with poorly differentiated high-grade neuroendocrine carcinoma Being followed closely by oncology  Elevated blood pressure Has normalized today, likely elevated yesterday due to stress and anxiety of admission    DVT prophylaxis: SCD Code Status: Full Family Communication: None today     Studies: MR ABDOMEN MRCP  W WO CONTAST Result Date: 06/19/2023 CLINICAL DATA:  History of metastatic neuroendocrine tumor with jaundice and elevated bilirubin EXAM: MRI ABDOMEN WITHOUT AND WITH CONTRAST (INCLUDING MRCP) TECHNIQUE: Multiplanar multisequence MR imaging of the abdomen was performed both before and after the administration of intravenous contrast. Heavily T2-weighted images of the biliary and pancreatic ducts were obtained. Post-processing was applied at the acquisition scanner with concurrent physician supervision which includes 3D reconstructions, MIPs, volume rendered images and/or shaded surface rendering.  CONTRAST:  10mL GADAVIST  GADOBUTROL  1 MMOL/ML IV SOLN COMPARISON:  CT abdomen and pelvis dated 06/18/2023, 06/05/2023, and multiple priors FINDINGS: Lower chest: No acute findings. Hepatobiliary: Multifocal mildly T2 hyperintense masses throughout the liver, many of which demonstrate associated diffusion restriction, for example index 1.5 x 1.2 cm lesion within segment 4, slightly decreased in size from 04/11/2023 when it measured 1.8 x 1.7 cm. Many of these lesions are new from MRI dated 07/02/2021 and not well seen by CT, for example 2.2 cm segment 2 (4:11) and 1.1 cm caudate lobe (4:11). A 7 mm segment 7 lesion (16:20) does not demonstrate associated diffusion restriction or substantial T2 hyperintensity. Similar moderate intrahepatic bile duct dilation. Similar positioning of a metallic bile duct stent within the downstream common bile duct extending into the duodenum. There are multifocal intraductal filling defects within the common bile duct upstream of the metallic stent. Cholecystectomy. Pancreas: Heterogeneous mass within the pancreatic head is not discretely measurable, however appears grossly similar when compared to multiple prior studies. Atrophy of the upstream pancreatic body and tail with diffuse dilation of the pancreatic duct, as before. Spleen:  Unchanged splenomegaly measures 15.7 cm in AP dimension. Adrenals/Urinary Tract: No adrenal nodules. No substantial change in size of periportal and retroperitoneal lymphadenopathy, for example 12 mm portacaval (4:19), 14 mm right perirenal (4:25), and 14 mm retrocaval (4:28). Stomach/Bowel: Visualized portions within the abdomen are unremarkable. Vascular/Lymphatic: No pathologically enlarged lymph nodes identified. No abdominal aortic aneurysm demonstrated. Other:  Trace mesenteric edema. Musculoskeletal: No suspicious bone lesions identified. IMPRESSION: 1. Multifocal intraductal filling defects within the common bile duct upstream of the metallic  stent, which may represent stones or sludge. Similar positioning of a metallic bile duct stent within the downstream common bile duct extending into the duodenum. Similar moderate intrahepatic bile duct dilation. 2. Multifocal mildly T2 hyperintense masses throughout the liver, consistent with hepatic metastatic disease. Index lesion within segment 4 is slightly decreased in size from 04/11/2023. Multiple new lesions are seen from MRI dated 07/02/2021 and not well seen by CT. 3. Heterogeneous mass within the pancreatic head is not discretely measurable, however appears grossly similar when compared to multiple prior studies. 4. No substantial change in size of periportal and retroperitoneal lymphadenopathy. 5. Unchanged splenomegaly. Electronically Signed   By: Limin  Xu M.D.   On: 06/19/2023 09:55   MR 3D Recon At Scanner Result Date: 06/19/2023 CLINICAL DATA:  History of metastatic neuroendocrine tumor with jaundice and elevated bilirubin EXAM: MRI ABDOMEN WITHOUT AND WITH CONTRAST (INCLUDING MRCP) TECHNIQUE: Multiplanar multisequence MR imaging of the abdomen was performed both before and after the administration of intravenous contrast. Heavily T2-weighted images of the biliary and pancreatic ducts were obtained. Post-processing was applied at the acquisition scanner with concurrent physician supervision which includes 3D reconstructions, MIPs, volume rendered images and/or shaded surface rendering. CONTRAST:  10mL GADAVIST  GADOBUTROL  1 MMOL/ML IV SOLN COMPARISON:  CT abdomen and pelvis dated 06/18/2023, 06/05/2023, and multiple priors FINDINGS: Lower chest: No acute findings. Hepatobiliary: Multifocal mildly T2 hyperintense  masses throughout the liver, many of which demonstrate associated diffusion restriction, for example index 1.5 x 1.2 cm lesion within segment 4, slightly decreased in size from 04/11/2023 when it measured 1.8 x 1.7 cm. Many of these lesions are new from MRI dated 07/02/2021 and not well  seen by CT, for example 2.2 cm segment 2 (4:11) and 1.1 cm caudate lobe (4:11). A 7 mm segment 7 lesion (16:20) does not demonstrate associated diffusion restriction or substantial T2 hyperintensity. Similar moderate intrahepatic bile duct dilation. Similar positioning of a metallic bile duct stent within the downstream common bile duct extending into the duodenum. There are multifocal intraductal filling defects within the common bile duct upstream of the metallic stent. Cholecystectomy. Pancreas: Heterogeneous mass within the pancreatic head is not discretely measurable, however appears grossly similar when compared to multiple prior studies. Atrophy of the upstream pancreatic body and tail with diffuse dilation of the pancreatic duct, as before. Spleen:  Unchanged splenomegaly measures 15.7 cm in AP dimension. Adrenals/Urinary Tract: No adrenal nodules. No substantial change in size of periportal and retroperitoneal lymphadenopathy, for example 12 mm portacaval (4:19), 14 mm right perirenal (4:25), and 14 mm retrocaval (4:28). Stomach/Bowel: Visualized portions within the abdomen are unremarkable. Vascular/Lymphatic: No pathologically enlarged lymph nodes identified. No abdominal aortic aneurysm demonstrated. Other:  Trace mesenteric edema. Musculoskeletal: No suspicious bone lesions identified. IMPRESSION: 1. Multifocal intraductal filling defects within the common bile duct upstream of the metallic stent, which may represent stones or sludge. Similar positioning of a metallic bile duct stent within the downstream common bile duct extending into the duodenum. Similar moderate intrahepatic bile duct dilation. 2. Multifocal mildly T2 hyperintense masses throughout the liver, consistent with hepatic metastatic disease. Index lesion within segment 4 is slightly decreased in size from 04/11/2023. Multiple new lesions are seen from MRI dated 07/02/2021 and not well seen by CT. 3. Heterogeneous mass within the  pancreatic head is not discretely measurable, however appears grossly similar when compared to multiple prior studies. 4. No substantial change in size of periportal and retroperitoneal lymphadenopathy. 5. Unchanged splenomegaly. Electronically Signed   By: Limin  Xu M.D.   On: 06/19/2023 09:55   CT ABDOMEN PELVIS W CONTRAST Result Date: 06/18/2023 CLINICAL DATA:  Elevated bilirubin. EXAM: CT ABDOMEN AND PELVIS WITH CONTRAST TECHNIQUE: Multidetector CT imaging of the abdomen and pelvis was performed using the standard protocol following bolus administration of intravenous contrast. RADIATION DOSE REDUCTION: This exam was performed according to the departmental dose-optimization program which includes automated exposure control, adjustment of the mA and/or kV according to patient size and/or use of iterative reconstruction technique. CONTRAST:  OMNIPAQUE  IOHEXOL  300 MG/ML  SOLN COMPARISON:  None Available. FINDINGS: Lower chest: Mild atelectatic changes are seen within the posterior aspect of the right lung base. Hepatobiliary: No focal liver abnormality is seen. Mild pneumobilia is noted. Status post cholecystectomy. A common bile duct stent is in place. Pancreas: A stable 4.6 cm mass is noted within the pancreatic head with atrophic changes seen involving the pancreatic body and tail. There is stable pancreatic ductal dilatation. Spleen: The spleen is enlarged and unchanged in size. Adrenals/Urinary Tract: Adrenal glands are unremarkable. Kidneys are normal, without renal calculi, focal lesion, or hydronephrosis. Bladder is unremarkable. Stomach/Bowel: Stomach is within normal limits. Appendix appears normal. There is mild to moderate severity thickening of the proximal duodenum. No evidence of bowel dilatation. Mild mesenteric edema is seen and unchanged in severity when compared to the prior study Vascular/Lymphatic: Numerous tortuous varices are  seen within the mid and upper abdomen. Stable celiac and  peripancreatic lymphadenopathy is seen. Multiple subcentimeter para-aortic and aortocaval lymph nodes are also noted. Reproductive: Prostate is unremarkable. Other: No abdominal wall hernia or abnormality. No abdominopelvic ascites. Musculoskeletal: No acute or significant osseous findings. IMPRESSION: 1. Stable pancreatic head mass with atrophic changes involving the pancreatic body and tail. 2. Common bile duct stent in place with mild pneumobilia. 3. Stable celiac and peripancreatic lymphadenopathy. 4. Stable splenomegaly. 5. Mild to moderate severity thickening of the proximal duodenum which may represent duodenitis. 6. Stable mesenteric edema. Electronically Signed   By: Virgle Grime M.D.   On: 06/18/2023 20:33    Principal Problem:   Elevated bilirubin Active Problems:   Elevated LFTs   Neuroendocrine tumor of pancreas     [employer], [facility]  If 7PM-7AM, please contact night-coverage [email or web address]   LOS: 0 days

## 2023-06-19 NOTE — H&P (View-Only) (Signed)
 Referring Provider: [facility] Primary Care Physician:  Patient, No Pcp Per Primary Gastroenterologist: Unassigned  Reason for Consultation: Jaundice, history of biliary stent  HPI: Leon Fischer. is a 33 y.o. 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  Patient is currently followed by oncology and currently on chemotherapy.  Was advised to come to the hospital from cancer center because of worsening LFTs with jaundice.  There was concern for liver toxicity from FOLFIRI.  Blood work yesterday showed T. bili of 10.2.  His bilirubin was normal on May 16, 2023 and 3.4 on June 04, 2023.  Mildly elevated AST ALT and alkaline phosphatase of 682.  Mostly indirect hyperbilirubinemia with indirect bilirubin of 4.2.  Normal CBC except for mild anemia with hemoglobin of 11.3.  CT abdomen pelvis with IV contrast showed stable pancreatic head mass with the CBD stent with mild pneumobilia.  Thickening of the duodenum probably from duodenitis.  Follow-up MRI MRCP showed multiple CBD stone within the common bile duct upstream of metallic stent.  Also showed multiple liver lesion consistent with metastatic disease.  Patient denies any GI symptoms Past Medical History:  Diagnosis Date   Asthma    Neuroendocrine cancer [facility])     Past Surgical History:  Procedure Laterality Date   BILIARY DILATION  07/13/2021   Procedure: BILIARY DILATION;  Surgeon: Ozell Blunt, MD;  Location: [facility];  Service: Gastroenterology;;   BILIARY STENT PLACEMENT N/A 07/13/2021   Procedure: BILIARY STENT PLACEMENT;  Surgeon: Ozell Blunt, MD;  Location: [name];  Service: Gastroenterology;  Laterality: N/A;   BILIARY STENT PLACEMENT N/A 11/05/2021   Procedure: BILIARY STENT PLACEMENT;  Surgeon: Ozell Blunt, MD;  Location: [name];  Service:  Gastroenterology;  Laterality: N/A;   BIOPSY  07/13/2021   Procedure: BIOPSY;  Surgeon: Brice Campi Albino Alu., MD;  Location: [facility];  Service: Gastroenterology;;   CHOLECYSTECTOMY N/A 11/07/2021   Procedure: LAPAROSCOPIC CHOLECYSTECTOMY;  Surgeon: Adalberto Acton, MD;  Location: [facility];  Service: General;  Laterality: N/A;   ENDOSCOPIC RETROGRADE CHOLANGIOPANCREATOGRAPHY (ERCP) WITH PROPOFOL  N/A 07/13/2021   Procedure: ENDOSCOPIC RETROGRADE CHOLANGIOPANCREATOGRAPHY (ERCP) WITH PROPOFOL ;  Surgeon: Ozell Blunt, MD;  Location: [name];  Service: Gastroenterology;  Laterality: N/A;   ERCP N/A 11/05/2021   Procedure: ENDOSCOPIC RETROGRADE CHOLANGIOPANCREATOGRAPHY (ERCP);  Surgeon: Ozell Blunt, MD;  Location: [facility];  Service: Gastroenterology;  Laterality: N/A;   ESOPHAGOGASTRODUODENOSCOPY (EGD) WITH PROPOFOL  N/A 07/13/2021   Procedure: ESOPHAGOGASTRODUODENOSCOPY (EGD) WITH PROPOFOL ;  Surgeon: Brice Campi Albino Alu., MD;  Location: [name];  Service: Gastroenterology;  Laterality: N/A;   EUS N/A 07/13/2021   Procedure: UPPER ENDOSCOPIC ULTRASOUND (EUS) LINEAR;  Surgeon: Normie Becton., MD;  Location: [name];  Service: Gastroenterology;  Laterality: N/A;   FINE NEEDLE ASPIRATION  07/13/2021   Procedure: FINE NEEDLE ASPIRATION (FNA) LINEAR;  Surgeon: Normie Becton., MD;  Location: [facility];  Service: Gastroenterology;;   IR IMAGING GUIDED PORT INSERTION  07/31/2021   SPHINCTEROTOMY  07/13/2021   Procedure: [address];  Surgeon: Ozell Blunt, MD;  Location: [name];  Service: Gastroenterology;;    Prior to Admission medications   Medication Sig Start Date End Date Taking? Authorizing Provider  acetaminophen  (TYLENOL ) 500 MG tablet Take 1,000 mg by mouth every 6 (six) hours as needed for mild pain.   Yes [provider]  albuterol  (VENTOLIN  HFA) 108 (90 Base)  MCG/ACT inhaler Inhale 1-2 puffs into the lungs every 6 (six) hours as needed for  wheezing or shortness of breath. 01/23/23  Yes Hongalgi, Anand D, MD  methocarbamol  (ROBAXIN ) 500 MG tablet Take 1 tablet (500 mg total) by mouth 2 (two) times daily. Patient taking differently: Take 500 mg by mouth 2 (two) times daily as needed for muscle spasms. 03/22/23  Yes Neil Balls A, PA  ondansetron  (ZOFRAN ) 8 MG tablet Take 1 tablet (8 mg total) by mouth every 8 (eight) hours as needed for nausea, vomiting or refractory nausea / vomiting. Start on the third day after chemotherapy. 07/24/22  Yes [name], MD  prochlorperazine  (COMPAZINE ) 10 MG tablet Take 1 tablet (10 mg total) by mouth every 6 (six) hours as needed for nausea or vomiting. 07/24/22  Yes [name], MD  dexamethasone  (DECADRON ) 4 MG tablet Take 2 tablets (8 mg total) by mouth daily. Start the day after chemotherapy for 2 days. Take with food. Patient not taking: Reported on 06/18/2023 09/25/22   [name], MD  lidocaine  (LIDODERM ) 5 % Place 1 patch onto the skin daily. Remove & Discard patch within 12 hours or as directed by MD Patient not taking: Reported on 06/18/2023 03/22/23   Leadington Butter, PA  predniSONE  (DELTASONE ) 10 MG tablet Take 3 tabs (30 mg total) daily x 2 days, then 2 tabs (20 mg total) daily x 2 days, then 1 tab (10 mg total) daily x 2 days, then stop. Patient not taking: Reported on 06/18/2023 01/24/23   Casey Clay, MD    Scheduled Meds:  enoxaparin  (LOVENOX ) injection  40 mg Subcutaneous Q24H   Continuous Infusions: PRN Meds:.acetaminophen  **OR** acetaminophen , ondansetron  **OR** ondansetron  (ZOFRAN ) IV, senna-docusate  Allergies as of 06/18/2023 - Review Complete 06/18/2023  Allergen Reaction Noted   Shellfish allergy Anaphylaxis, Shortness Of Breath, Swelling, and Other (See Comments) 05/28/2011    Family History  Problem Relation Age of Onset   Drug abuse Father     Social History   Socioeconomic History   Marital status: Single    Spouse name: Not  on file   Number of children: 0   Years of education: Not on file   Highest education level: Some college, no degree  Occupational History   Not on file  Tobacco Use   Smoking status: Some Days    Types: Cigarettes   Smokeless tobacco: Never  Vaping Use   Vaping status: Never Used  Substance and Sexual Activity   Alcohol use: Yes    Comment: social   Drug use: No   Sexual activity: Never  Other Topics Concern   Not on file  Social History Narrative   Not on file   Social Drivers of Health   Financial Resource Strain: Low Risk  (04/14/2017)   Overall Financial Resource Strain (CARDIA)    Difficulty of Paying Living Expenses: Not hard at all  Food Insecurity: No Food Insecurity (06/18/2023)   Hunger Vital Sign    Worried About Running Out of Food in the Last Year: Never true    Ran Out of Food in the Last Year: Never true  Transportation Needs: No Transportation Needs (06/18/2023)   PRAPARE - Administrator, Civil Service (Medical): No    Lack of Transportation (Non-Medical): No  Physical Activity: Inactive (04/14/2017)   Exercise Vital Sign    Days of Exercise per Week: 0 days    Minutes of Exercise per Session: 0 min  Stress: Stress Concern Present (04/14/2017)   [name] of Occupational Health - Occupational Stress Questionnaire    Feeling of Stress : Rather much  Social Connections: Unknown (06/19/2021)   [facility]   Social Network    Social Network: Not on file  Intimate Partner Violence: Not At Risk (06/18/2023)   Humiliation, Afraid, Rape, and Kick questionnaire    Fear of Current or Ex-Partner: No    Emotionally Abused: No    Physically Abused: No    Sexually Abused: No    Review of Systems: All negative except as stated above in HPI.  Physical Exam: Vital signs: Vitals:   06/18/23 2221 06/19/23 0511  BP: 116/87 124/70  Pulse: 81 72  Resp:  18  Temp: 98.2 F (36.8 C) 98.1 F (36.7 C)  SpO2: 100% 99%    Last BM Date : 06/18/23 General:   Alert,  Well-developed, well-nourished, pleasant and cooperative in NAD Normocephalic, atraumatic, extraocular movement intact Sclera icterus noted Lungs: No visible respiratory distress Heart:  Regular rate and rhythm; no murmurs, clicks, rubs,  or gallops. Abdomen: Soft, nontender, nondistended, bowel sound present, no peritoneal signs Mood and affect normal Alert and oriented x 3 Rectal:  Deferred  GI:  Lab Results: Recent Labs    06/18/23 1349 06/18/23 1913 06/18/23 1939  WBC 4.6 5.2  --   HGB 11.3* 11.3* 11.6*  HCT 33.6* 35.6* 34.0*  PLT 226 251  --    BMET Recent Labs    06/18/23 1349 06/18/23 1913 06/18/23 1939  NA 137 137 140  K 3.4* 3.7 3.6  CL 103 104 106  CO2 26 27  --   GLUCOSE 139* 102* 103*  BUN 10 9 6   CREATININE 0.88 0.57* 0.80  CALCIUM  9.3 9.3  --    LFT Recent Labs    06/18/23 1913  PROT 8.4*  ALBUMIN 3.4*  AST 156*  ALT 145*  ALKPHOS 688*  BILITOT 9.7*  9.7*  BILIDIR 5.5*  IBILI 4.2*   PT/INR No results for input(s): "LABPROT", "INR" in the last 72 hours.   Studies/Results: CT ABDOMEN PELVIS W CONTRAST Result Date: 06/18/2023 CLINICAL DATA:  Elevated bilirubin. EXAM: CT ABDOMEN AND PELVIS WITH CONTRAST TECHNIQUE: Multidetector CT imaging of the abdomen and pelvis was performed using the standard protocol following bolus administration of intravenous contrast. RADIATION DOSE REDUCTION: This exam was performed according to the departmental dose-optimization program which includes automated exposure control, adjustment of the mA and/or kV according to patient size and/or use of iterative reconstruction technique. CONTRAST:  OMNIPAQUE  IOHEXOL  300 MG/ML  SOLN COMPARISON:  None Available. FINDINGS: Lower chest: Mild atelectatic changes are seen within the posterior aspect of the right lung base. Hepatobiliary: No focal liver abnormality is seen. Mild pneumobilia is noted. Status post cholecystectomy. A  common bile duct stent is in place. Pancreas: A stable 4.6 cm mass is noted within the pancreatic head with atrophic changes seen involving the pancreatic body and tail. There is stable pancreatic ductal dilatation. Spleen: The spleen is enlarged and unchanged in size. Adrenals/Urinary Tract: Adrenal glands are unremarkable. Kidneys are normal, without renal calculi, focal lesion, or hydronephrosis. Bladder is unremarkable. Stomach/Bowel: Stomach is within normal limits. Appendix appears normal. There is mild to moderate severity thickening of the proximal duodenum. No evidence of bowel dilatation. Mild mesenteric edema is seen and unchanged in severity when compared to the prior study Vascular/Lymphatic: Numerous tortuous varices are seen within the mid and upper abdomen.  Stable celiac and peripancreatic lymphadenopathy is seen. Multiple subcentimeter para-aortic and aortocaval lymph nodes are also noted. Reproductive: Prostate is unremarkable. Other: No abdominal wall hernia or abnormality. No abdominopelvic ascites. Musculoskeletal: No acute or significant osseous findings. IMPRESSION: 1. Stable pancreatic head mass with atrophic changes involving the pancreatic body and tail. 2. Common bile duct stent in place with mild pneumobilia. 3. Stable celiac and peripancreatic lymphadenopathy. 4. Stable splenomegaly. 5. Mild to moderate severity thickening of the proximal duodenum which may represent duodenitis. 6. Stable mesenteric edema. Electronically Signed   By: Virgle Grime M.D.   On: 06/18/2023 20:33    Impression/Plan: -Jaundice in a patient with known metastatic neuroendocrine tumor of pancreas with liver mets.  MRI MRCP showed multiple filling defect within common bile duct upstream of previously placed metallic stent.  Recommendations --------------------------- - Plan for ERCP tomorrow with Dr. Feliberto Hopping.  Okay to have soft diet today.  Keep n.p.o. past midnight.  Risks (post ERCP pancreatitis,  bleeding, infection, bowel perforation that could require surgery, sedation-related changes in cardiopulmonary systems), benefits (identification and possible treatment of source of symptoms, exclusion of certain causes of symptoms), and alternatives (watchful waiting, radiographic imaging studies, empiric medical treatment)  were explained to patient/family in detail and patient wishes to proceed.     LOS: 0 days   Felecia Hopper  MD, FACP 06/19/2023, 8:45 AM  Contact #  [phone]

## 2023-06-20 ENCOUNTER — Encounter (HOSPITAL_COMMUNITY): Payer: Self-pay | Admitting: Student

## 2023-06-20 ENCOUNTER — Observation Stay (HOSPITAL_COMMUNITY)

## 2023-06-20 ENCOUNTER — Observation Stay (HOSPITAL_BASED_OUTPATIENT_CLINIC_OR_DEPARTMENT_OTHER): Admitting: Anesthesiology

## 2023-06-20 ENCOUNTER — Observation Stay (HOSPITAL_COMMUNITY): Admitting: Anesthesiology

## 2023-06-20 ENCOUNTER — Encounter (HOSPITAL_COMMUNITY): Admission: EM | Disposition: A | Discharge status: Home / Self Care | Payer: Self-pay | Source: Home / Self Care

## 2023-06-20 DIAGNOSIS — J449 Chronic obstructive pulmonary disease, unspecified: Secondary | ICD-10-CM

## 2023-06-20 DIAGNOSIS — F17210 Nicotine dependence, cigarettes, uncomplicated: Secondary | ICD-10-CM

## 2023-06-20 DIAGNOSIS — K8050 Calculus of bile duct without cholangitis or cholecystitis without obstruction: Secondary | ICD-10-CM | POA: Diagnosis not present

## 2023-06-20 DIAGNOSIS — R17 Unspecified jaundice: Secondary | ICD-10-CM | POA: Diagnosis not present

## 2023-06-20 HISTORY — PX: ERCP: SHX5425

## 2023-06-20 LAB — COMPREHENSIVE METABOLIC PANEL WITH GFR
ALT: 158 U/L — ABNORMAL HIGH (ref 0–44)
AST: 163 U/L — ABNORMAL HIGH (ref 15–41)
Albumin: 3.1 g/dL — ABNORMAL LOW (ref 3.5–5.0)
Alkaline Phosphatase: 649 U/L — ABNORMAL HIGH (ref 38–126)
Anion gap: 9 (ref 5–15)
BUN: 7 mg/dL (ref 6–20)
CO2: 20 mmol/L — ABNORMAL LOW (ref 22–32)
Calcium: 8.6 mg/dL — ABNORMAL LOW (ref 8.9–10.3)
Chloride: 106 mmol/L (ref 98–111)
Creatinine, Ser: 0.71 mg/dL (ref 0.61–1.24)
GFR, Estimated: >60 mL/min (ref >60)
Glucose, Bld: 100 mg/dL — ABNORMAL HIGH (ref 70–99)
Potassium: 3.4 mmol/L — ABNORMAL LOW (ref 3.5–5.1)
Sodium: 135 mmol/L (ref 135–145)
Total Bilirubin: 8.1 mg/dL — ABNORMAL HIGH (ref 0.0–1.2)
Total Protein: 7.6 g/dL (ref 6.5–8.1)

## 2023-06-20 SURGERY — ERCP, WITH INTERVENTION IF INDICATED
Anesthesia: General

## 2023-06-20 MED ORDER — PHENYLEPHRINE 80 MCG/ML (10ML) SYRINGE FOR IV PUSH (FOR BLOOD PRESSURE SUPPORT)
PREFILLED_SYRINGE | INTRAVENOUS | Status: DC | PRN
  Administered 2023-06-20 (×3): 160 ug via INTRAVENOUS
  Administered 2023-06-20: 80 ug via INTRAVENOUS

## 2023-06-20 MED ORDER — PROPOFOL 10 MG/ML IV BOLUS
INTRAVENOUS | Status: DC | PRN
  Administered 2023-06-20: 200 mg via INTRAVENOUS

## 2023-06-20 MED ORDER — CIPROFLOXACIN IN D5W 400 MG/200ML IV SOLN
INTRAVENOUS | Status: AC
  Filled 2023-06-20: qty 200

## 2023-06-20 MED ORDER — DEXAMETHASONE SODIUM PHOSPHATE 10 MG/ML IJ SOLN
INTRAMUSCULAR | Status: DC | PRN
  Administered 2023-06-20: 10 mg via INTRAVENOUS

## 2023-06-20 MED ORDER — SODIUM CHLORIDE 0.9 % IV SOLN
INTRAVENOUS | Status: DC | PRN
  Administered 2023-06-20: 50 mL

## 2023-06-20 MED ORDER — DICLOFENAC SUPPOSITORY 100 MG
RECTAL | Status: AC
  Filled 2023-06-20: qty 1

## 2023-06-20 MED ORDER — GLUCAGON HCL RDNA (DIAGNOSTIC) 1 MG IJ SOLR
INTRAMUSCULAR | Status: AC
  Filled 2023-06-20: qty 2

## 2023-06-20 MED ORDER — SUGAMMADEX SODIUM 200 MG/2ML IV SOLN
INTRAVENOUS | Status: DC | PRN
  Administered 2023-06-20: 400 mg via INTRAVENOUS

## 2023-06-20 MED ORDER — DICLOFENAC SUPPOSITORY 100 MG
RECTAL | Status: DC | PRN
  Administered 2023-06-20: 100 mg via RECTAL

## 2023-06-20 MED ORDER — POTASSIUM CHLORIDE CRYS ER 20 MEQ PO TBCR
40.0000 meq | EXTENDED_RELEASE_TABLET | Freq: Once | ORAL | Status: AC
  Administered 2023-06-20: 40 meq via ORAL
  Filled 2023-06-20: qty 2

## 2023-06-20 MED ORDER — FENTANYL CITRATE (PF) 100 MCG/2ML IJ SOLN
INTRAMUSCULAR | Status: AC
  Filled 2023-06-20: qty 2

## 2023-06-20 MED ORDER — SODIUM CHLORIDE 0.9 % IV SOLN
INTRAVENOUS | Status: AC | PRN
  Administered 2023-06-20: 500 mL via INTRAMUSCULAR

## 2023-06-20 MED ORDER — CIPROFLOXACIN IN D5W 400 MG/200ML IV SOLN
INTRAVENOUS | Status: DC | PRN
  Administered 2023-06-20: 400 mg via INTRAVENOUS

## 2023-06-20 MED ORDER — ROCURONIUM BROMIDE 10 MG/ML (PF) SYRINGE
PREFILLED_SYRINGE | INTRAVENOUS | Status: DC | PRN
Start: 2023-06-20 — End: 2023-06-20
  Administered 2023-06-20: 60 mg via INTRAVENOUS
  Administered 2023-06-20: 30 mg via INTRAVENOUS

## 2023-06-20 MED ORDER — PANTOPRAZOLE SODIUM 40 MG PO TBEC
40.0000 mg | DELAYED_RELEASE_TABLET | Freq: Two times a day (BID) | ORAL | Status: DC
  Administered 2023-06-20: 40 mg via ORAL
  Filled 2023-06-20: qty 1

## 2023-06-20 MED ORDER — FENTANYL CITRATE (PF) 100 MCG/2ML IJ SOLN
INTRAMUSCULAR | Status: DC | PRN
  Administered 2023-06-20: 100 ug via INTRAVENOUS

## 2023-06-20 MED ORDER — ONDANSETRON HCL 4 MG/2ML IJ SOLN
INTRAMUSCULAR | Status: DC | PRN
  Administered 2023-06-20: 4 mg via INTRAVENOUS

## 2023-06-20 MED ORDER — PROPOFOL 10 MG/ML IV BOLUS
INTRAVENOUS | Status: AC
  Filled 2023-06-20: qty 20

## 2023-06-20 MED ORDER — HEPARIN SOD (PORK) LOCK FLUSH 100 UNIT/ML IV SOLN
500.0000 [IU] | INTRAVENOUS | Status: AC | PRN
  Administered 2023-06-20: 500 [IU]

## 2023-06-20 MED ORDER — LIDOCAINE 2% (20 MG/ML) 5 ML SYRINGE
INTRAMUSCULAR | Status: DC | PRN
Start: 2023-06-20 — End: 2023-06-20
  Administered 2023-06-20: 20 mg via INTRAVENOUS

## 2023-06-20 NOTE — Plan of Care (Signed)

## 2023-06-20 NOTE — Interval H&P Note (Signed)
 History and Physical Interval Note: 32/male with metastatic neuroendocrine tumor of pancreas, MRCP with filling defect within CBD for ERCP with propofol .  06/20/2023 7:38 AM  Leon Fischer.  has presented today for ERCP with propofol , with the diagnosis of Jaundice, choledocholithiasis.  The various methods of treatment have been discussed with the patient and family. After consideration of risks, benefits and other options for treatment, the patient has consented to  Procedure(s): ERCP, WITH INTERVENTION IF INDICATED (N/A) as a surgical intervention.  The patient's history has been reviewed, patient examined, no change in status, stable for surgery.  I have reviewed the patient's chart and labs.  Questions were answered to the patient's satisfaction.     [name]

## 2023-06-20 NOTE — Discharge Summary (Signed)
 Leon Fischer. EXB:[id number] DOB: 03/30/1990 DOA: 06/18/2023  PCP: Patient, No Pcp Per  Admit date: 06/18/2023  Discharge date: 06/20/2023  Admitted From: Home   disposition: Home   Recommendations for Outpatient Follow-up:   Follow up with PCP or oncologist in 1-2 weeks  Home Health: N/A Equipment/Devices: N/A Consultations: Gastroenterology Discharge Condition: Improved CODE STATUS: Full     Chief Complaint  Patient presents with   Abnormal Lab     Brief history of present illness from the day of admission and additional interim summary    Leon Manella. is a 33 y.o. 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  Patient is currently followed by oncology and currently on chemotherapy.  Was advised to come to the hospital from cancer center because of worsening LFTs with jaundice.  There was concern for liver toxicity from FOLFIRI.   Blood work on admission showed T. bili of 10.2.  His bilirubin was normal on May 16, 2023 and 3.4 on June 04, 2023.  Mildly elevated AST ALT and alkaline phosphatase of 682.  Mostly indirect hyperbilirubinemia with indirect bilirubin of 4.2.  Normal CBC except for mild anemia with hemoglobin of 11.3.   CT abdomen pelvis with IV contrast showed stable pancreatic head mass with the CBD stent with mild pneumobilia.  Thickening of the duodenum probably from duodenitis.                                                                     Hospital Course   Follow-up MRI MRCP showed multiple CBD stone within the common bile duct upstream of metallic stent.  Also showed multiple liver lesion  consistent with metastatic disease.  Patient was seen by gastroenterology who recommended ERCP.  Day after admission patient underwent ERCP without complications and tolerated the procedure well.  Results of the ERCP are as follows: The upper GI tract was grossly normal. Multiple ulcers were noted in the gastric antrum and duodenal bulb. One metal stent was removed from the common bile duct . The lower third of the main bile duct and middle third of the main bile  duct contained filling defects thought to be stones and sludge. A stricture was noted in the mid CBD. Sludge was swept from the duct. All stones were removed. One 10 mm by 6 cm covered metal stent was placed 5.5 cm into the common  bile duct. Bile flowed through the stent. The stent was in good position.  After ERCP, patient was awake and alert and requesting to go home.  Noted he had no nausea or vomiting.  He ate a full lunch with  good appetite and had no increase in pain or nausea or vomiting.  Patient requesting to go home.  Patient was discharged home to follow-up with PCP and oncology as already scheduled and gastroenterology if warranted.  Discharge diagnosis     Principal Problem:   Elevated bilirubin Active Problems:   Elevated LFTs   Neuroendocrine tumor of pancreas    Discharge instructions    Discharge Instructions     Call MD for:  persistant dizziness or light-headedness   Complete by: As directed    Call MD for:  persistant nausea and vomiting   Complete by: As directed    Call MD for:  severe uncontrolled pain   Complete by: As directed    Diet - low sodium heart healthy   Complete by: As directed    Discharge instructions   Complete by: As directed    Please see your PCP or oncologist in 5-7 days to make sure you are still doing well.   Increase activity slowly   Complete by: As directed        Discharge Medications   Allergies as of 06/20/2023       Reactions   Shellfish Allergy  Anaphylaxis, Shortness Of Breath, Swelling, Other (See Comments)   Eyes & throat swell shut, and the face swells        Medication List     STOP taking these medications    dexamethasone  4 MG tablet Commonly known as: DECADRON    lidocaine  5 % Commonly known as: Lidoderm    predniSONE  10 MG tablet Commonly known as: DELTASONE    prochlorperazine  10 MG tablet Commonly known as: COMPAZINE        TAKE these medications    acetaminophen  500 MG tablet Commonly known as: TYLENOL  Take 1,000 mg by mouth every 6 (six) hours as needed for mild pain.   albuterol  108 (90 Base) MCG/ACT inhaler Commonly known as: VENTOLIN  HFA Inhale 1-2 puffs into the lungs every 6 (six) hours as needed for wheezing or shortness of breath.   methocarbamol  500 MG tablet Commonly known as: ROBAXIN  Take 1 tablet (500 mg total) by mouth 2 (two) times daily. What changed:  when to take this reasons to take this   ondansetron  8 MG tablet Commonly known as: ZOFRAN  Take 1 tablet (8 mg total) by mouth every 8 (eight) hours as needed for nausea, vomiting or refractory nausea / vomiting. Start on the third day after chemotherapy.          Major procedures and Radiology Reports - PLEASE review detailed and final reports thoroughly  -     DG C-Arm 1-60 Min-No Report Result Date: 06/20/2023 Fluoroscopy was utilized by the requesting physician.  No radiographic interpretation.   MR ABDOMEN MRCP W WO CONTAST Result Date: 06/19/2023 CLINICAL DATA:  History of metastatic neuroendocrine tumor with jaundice and elevated bilirubin EXAM: MRI ABDOMEN WITHOUT AND WITH CONTRAST (INCLUDING MRCP) TECHNIQUE: Multiplanar multisequence MR imaging of the abdomen was performed both before and after the administration of intravenous contrast. Heavily T2-weighted images of the biliary and pancreatic ducts were obtained. Post-processing was applied at the acquisition scanner with concurrent physician supervision which includes  3D reconstructions, MIPs, volume rendered images and/or shaded surface rendering. CONTRAST:  10mL GADAVIST  GADOBUTROL  1 MMOL/ML IV SOLN COMPARISON:  CT abdomen and pelvis dated 06/18/2023, 06/05/2023, and multiple priors FINDINGS: Lower chest: No acute findings. Hepatobiliary: Multifocal mildly T2 hyperintense masses throughout the liver, many of which demonstrate associated diffusion restriction, for example index 1.5  x 1.2 cm lesion within segment 4, slightly decreased in size from 04/11/2023 when it measured 1.8 x 1.7 cm. Many of these lesions are new from MRI dated 07/02/2021 and not well seen by CT, for example 2.2 cm segment 2 (4:11) and 1.1 cm caudate lobe (4:11). A 7 mm segment 7 lesion (16:20) does not demonstrate associated diffusion restriction or substantial T2 hyperintensity. Similar moderate intrahepatic bile duct dilation. Similar positioning of a metallic bile duct stent within the downstream common bile duct extending into the duodenum. There are multifocal intraductal filling defects within the common bile duct upstream of the metallic stent. Cholecystectomy. Pancreas: Heterogeneous mass within the pancreatic head is not discretely measurable, however appears grossly similar when compared to multiple prior studies. Atrophy of the upstream pancreatic body and tail with diffuse dilation of the pancreatic duct, as before. Spleen:  Unchanged splenomegaly measures 15.7 cm in AP dimension. Adrenals/Urinary Tract: No adrenal nodules. No substantial change in size of periportal and retroperitoneal lymphadenopathy, for example 12 mm portacaval (4:19), 14 mm right perirenal (4:25), and 14 mm retrocaval (4:28). Stomach/Bowel: Visualized portions within the abdomen are unremarkable. Vascular/Lymphatic: No pathologically enlarged lymph nodes identified. No abdominal aortic aneurysm demonstrated. Other:  Trace mesenteric edema. Musculoskeletal: No suspicious bone lesions identified. IMPRESSION: 1. Multifocal  intraductal filling defects within the common bile duct upstream of the metallic stent, which may represent stones or sludge. Similar positioning of a metallic bile duct stent within the downstream common bile duct extending into the duodenum. Similar moderate intrahepatic bile duct dilation. 2. Multifocal mildly T2 hyperintense masses throughout the liver, consistent with hepatic metastatic disease. Index lesion within segment 4 is slightly decreased in size from 04/11/2023. Multiple new lesions are seen from MRI dated 07/02/2021 and not well seen by CT. 3. Heterogeneous mass within the pancreatic head is not discretely measurable, however appears grossly similar when compared to multiple prior studies. 4. No substantial change in size of periportal and retroperitoneal lymphadenopathy. 5. Unchanged splenomegaly. Electronically Signed   By: Limin  Xu M.D.   On: 06/19/2023 09:55   MR 3D Recon At Scanner Result Date: 06/19/2023 CLINICAL DATA:  History of metastatic neuroendocrine tumor with jaundice and elevated bilirubin EXAM: MRI ABDOMEN WITHOUT AND WITH CONTRAST (INCLUDING MRCP) TECHNIQUE: Multiplanar multisequence MR imaging of the abdomen was performed both before and after the administration of intravenous contrast. Heavily T2-weighted images of the biliary and pancreatic ducts were obtained. Post-processing was applied at the acquisition scanner with concurrent physician supervision which includes 3D reconstructions, MIPs, volume rendered images and/or shaded surface rendering. CONTRAST:  10mL GADAVIST  GADOBUTROL  1 MMOL/ML IV SOLN COMPARISON:  CT abdomen and pelvis dated 06/18/2023, 06/05/2023, and multiple priors FINDINGS: Lower chest: No acute findings. Hepatobiliary: Multifocal mildly T2 hyperintense masses throughout the liver, many of which demonstrate associated diffusion restriction, for example index 1.5 x 1.2 cm lesion within segment 4, slightly decreased in size from 04/11/2023 when it measured 1.8  x 1.7 cm. Many of these lesions are new from MRI dated 07/02/2021 and not well seen by CT, for example 2.2 cm segment 2 (4:11) and 1.1 cm caudate lobe (4:11). A 7 mm segment 7 lesion (16:20) does not demonstrate associated diffusion restriction or substantial T2 hyperintensity. Similar moderate intrahepatic bile duct dilation. Similar positioning of a metallic bile duct stent within the downstream common bile duct extending into the duodenum. There are multifocal intraductal filling defects within the common bile duct upstream of the metallic stent. Cholecystectomy. Pancreas: Heterogeneous mass within the pancreatic  head is not discretely measurable, however appears grossly similar when compared to multiple prior studies. Atrophy of the upstream pancreatic body and tail with diffuse dilation of the pancreatic duct, as before. Spleen:  Unchanged splenomegaly measures 15.7 cm in AP dimension. Adrenals/Urinary Tract: No adrenal nodules. No substantial change in size of periportal and retroperitoneal lymphadenopathy, for example 12 mm portacaval (4:19), 14 mm right perirenal (4:25), and 14 mm retrocaval (4:28). Stomach/Bowel: Visualized portions within the abdomen are unremarkable. Vascular/Lymphatic: No pathologically enlarged lymph nodes identified. No abdominal aortic aneurysm demonstrated. Other:  Trace mesenteric edema. Musculoskeletal: No suspicious bone lesions identified. IMPRESSION: 1. Multifocal intraductal filling defects within the common bile duct upstream of the metallic stent, which may represent stones or sludge. Similar positioning of a metallic bile duct stent within the downstream common bile duct extending into the duodenum. Similar moderate intrahepatic bile duct dilation. 2. Multifocal mildly T2 hyperintense masses throughout the liver, consistent with hepatic metastatic disease. Index lesion within segment 4 is slightly decreased in size from 04/11/2023. Multiple new lesions are seen from MRI  dated 07/02/2021 and not well seen by CT. 3. Heterogeneous mass within the pancreatic head is not discretely measurable, however appears grossly similar when compared to multiple prior studies. 4. No substantial change in size of periportal and retroperitoneal lymphadenopathy. 5. Unchanged splenomegaly. Electronically Signed   By: Limin  Xu M.D.   On: 06/19/2023 09:55   CT ABDOMEN PELVIS W CONTRAST Result Date: 06/18/2023 CLINICAL DATA:  Elevated bilirubin. EXAM: CT ABDOMEN AND PELVIS WITH CONTRAST TECHNIQUE: Multidetector CT imaging of the abdomen and pelvis was performed using the standard protocol following bolus administration of intravenous contrast. RADIATION DOSE REDUCTION: This exam was performed according to the departmental dose-optimization program which includes automated exposure control, adjustment of the mA and/or kV according to patient size and/or use of iterative reconstruction technique. CONTRAST:  OMNIPAQUE  IOHEXOL  300 MG/ML  SOLN COMPARISON:  None Available. FINDINGS: Lower chest: Mild atelectatic changes are seen within the posterior aspect of the right lung base. Hepatobiliary: No focal liver abnormality is seen. Mild pneumobilia is noted. Status post cholecystectomy. A common bile duct stent is in place. Pancreas: A stable 4.6 cm mass is noted within the pancreatic head with atrophic changes seen involving the pancreatic body and tail. There is stable pancreatic ductal dilatation. Spleen: The spleen is enlarged and unchanged in size. Adrenals/Urinary Tract: Adrenal glands are unremarkable. Kidneys are normal, without renal calculi, focal lesion, or hydronephrosis. Bladder is unremarkable. Stomach/Bowel: Stomach is within normal limits. Appendix appears normal. There is mild to moderate severity thickening of the proximal duodenum. No evidence of bowel dilatation. Mild mesenteric edema is seen and unchanged in severity when compared to the prior study Vascular/Lymphatic: Numerous  tortuous varices are seen within the mid and upper abdomen. Stable celiac and peripancreatic lymphadenopathy is seen. Multiple subcentimeter para-aortic and aortocaval lymph nodes are also noted. Reproductive: Prostate is unremarkable. Other: No abdominal wall hernia or abnormality. No abdominopelvic ascites. Musculoskeletal: No acute or significant osseous findings. IMPRESSION: 1. Stable pancreatic head mass with atrophic changes involving the pancreatic body and tail. 2. Common bile duct stent in place with mild pneumobilia. 3. Stable celiac and peripancreatic lymphadenopathy. 4. Stable splenomegaly. 5. Mild to moderate severity thickening of the proximal duodenum which may represent duodenitis. 6. Stable mesenteric edema. Electronically Signed   By: Virgle Grime M.D.   On: 06/18/2023 20:33   CT CHEST ABDOMEN PELVIS W CONTRAST Result Date: 06/05/2023 CLINICAL DATA:  Metastatic neuroendocrine tumor,  high-grade. Worsening liver function studies. Currently on chemotherapy. Evaluate for disease progression. No current patient complaints. EXAM: CT CHEST, ABDOMEN, AND PELVIS WITH CONTRAST TECHNIQUE: Multidetector CT imaging of the chest, abdomen and pelvis was performed following the standard protocol during bolus administration of intravenous contrast. Arterial phase images of the abdomen are obtained followed by portal venous phase images of the chest, abdomen and pelvis. RADIATION DOSE REDUCTION: This exam was performed according to the departmental dose-optimization program which includes automated exposure control, adjustment of the mA and/or kV according to patient size and/or use of iterative reconstruction technique. CONTRAST:  OMNIPAQUE  IOHEXOL  300 MG/ML  SOLN COMPARISON:  CT chest abdomen and pelvis 04/11/2023 FINDINGS: CT CHEST FINDINGS Cardiovascular: Normal heart size. No pericardial effusions. Normal caliber thoracic aorta. Central venous catheter with tip at the cavoatrial junction.  Mediastinum/Nodes: Thyroid gland is unremarkable. Esophagus is decompressed. No significant lymphadenopathy in the chest. Lungs/Pleura: Lungs are clear.  No pleural effusion or pneumothorax. Musculoskeletal: Degenerative changes in the spine. No focal bone lesions. CT ABDOMEN PELVIS FINDINGS Hepatobiliary: Wall stent in the common bile duct with associated pneumobilia. Surgical absence of the gallbladder. No significant bile duct dilatation. No focal liver lesions are identified. Pancreas: Mass in the head of the pancreas measuring 4.6 cm in diameter, similar to prior study. Atrophy of the body and tail of the pancreas with pancreatic ductal dilatation, also similar. Celiac axis and peripancreatic lymph nodes are enlarged, measuring up to about 12 mm in short axis dimension. This is likely metastatic. Diffuse upper abdominal varices are present. Spleen: Spleen is enlarged.  No focal lesions. Adrenals/Urinary Tract: Adrenal glands are unremarkable. Kidneys are normal, without renal calculi, focal lesion, or hydronephrosis. Bladder wall is diffusely thickened. This could be due to under distention or cystitis. Correlate with urinary analysis. Stomach/Bowel: Stomach, small bowel, and colon are not abnormally distended. No wall thickening or inflammatory changes are appreciated. Mild mesenteric edema. No loculated collections. Appendix is normal. Vascular/Lymphatic: Normal caliber abdominal aorta. Scattered retroperitoneal lymph nodes are unchanged since prior study. Reproductive: Prostate is unremarkable. Other: No free air or free fluid in the abdomen. Abdominal wall musculature appears intact. Musculoskeletal: Degenerative changes in the spine. No focal bone lesions. IMPRESSION: 1. Mass in the head of the pancreas without significant change. Pancreatic parenchymal atrophy and pancreatic ductal dilatation is also present. 2. Stent in the common bile duct with associated pneumobilia. No bile duct dilatation. 3.  Prominent lymph nodes in the upper abdominal and retroperitoneal regions, likely metastatic, unchanged. 4. No evidence of metastatic disease in the chest. 5. Bladder wall thickening may be due to under distention or cystitis. Correlate with urinalysis. Electronically Signed   By: Boyce Byes M.D.   On: 06/05/2023 16:25    Micro Results    No results found for this or any previous visit (from the past 240 hours).  Today   Subjective    Coolidge Coffing feels much improved since admission.  Feels ready to go home.  Denies chest pain, shortness of breath or abdominal pain.  Feels they can take care of themselves with the resources they have at home.  Objective   Blood pressure 130/86, pulse 83, temperature (!) 96.9 F (36.1 C), temperature source Temporal, resp. rate 18, height 6' (1.829 m), weight (!) 137 kg, SpO2 96%.   Intake/Output Summary (Last 24 hours) at 06/20/2023 1303 Last data filed at 06/20/2023 0912 Gross per 24 hour  Intake 1610 ml  Output --  Net 1610 ml  Exam General: Patient appears well and in good spirits sitting up in bed in no acute distress.  Eyes: sclera anicteric, conjuctiva mild injection bilaterally CVS: S1-S2, regular  Respiratory:  decreased air entry bilaterally secondary to decreased inspiratory effort, rales at bases  GI: NABS, soft, NT  LE: No edema.  Neuro: A/O x 3, Moving all extremities equally with normal strength, CN 3-12 intact, grossly nonfocal.  Psych: patient is logical and coherent, judgement and insight appear normal, mood and affect appropriate to situation.    Data Review   CBC w Diff:  Lab Results  Component Value Date   WBC 4.4 06/19/2023   HGB 10.7 (L) 06/19/2023   HGB 11.3 (L) 06/18/2023   HGB 14.2 04/14/2017   HCT 32.8 (L) 06/19/2023   HCT 42.5 04/14/2017   PLT 210 06/19/2023   PLT 226 06/18/2023   PLT 325 04/14/2017   LYMPHOPCT 9 06/18/2023   MONOPCT 11 06/18/2023   EOSPCT 5 06/18/2023   BASOPCT 0 06/18/2023     CMP:  Lab Results  Component Value Date   NA 135 06/20/2023   NA 142 04/14/2017   K 3.4 (L) 06/20/2023   CL 106 06/20/2023   CO2 20 (L) 06/20/2023   BUN 7 06/20/2023   BUN 12 04/14/2017   CREATININE 0.71 06/20/2023   CREATININE 0.88 06/18/2023   PROT 7.6 06/20/2023   PROT 7.5 04/14/2017   ALBUMIN 3.1 (L) 06/20/2023   ALBUMIN 4.6 04/14/2017   BILITOT 8.1 (H) 06/20/2023   BILITOT 10.2 (HH) 06/18/2023   ALKPHOS 649 (H) 06/20/2023   AST 163 (H) 06/20/2023   AST 143 (H) 06/18/2023   ALT 158 (H) 06/20/2023   ALT 138 (H) 06/18/2023  .   Total Time in preparing paper work, data evaluation and todays exam - 35 minutes  France Lusty Tublu Leon Fischer M.D on 06/20/2023 at 1:03 PM  [facility]

## 2023-06-20 NOTE — Anesthesia Postprocedure Evaluation (Signed)
 Anesthesia Post Note  Patient: Leon Fischer.  Procedure(s) Performed: ERCP, WITH INTERVENTION IF INDICATED     Patient location during evaluation: Endoscopy Anesthesia Type: General Level of consciousness: awake and alert, patient cooperative and oriented Pain management: pain level controlled Vital Signs Assessment: post-procedure vital signs reviewed and stable Respiratory status: spontaneous breathing, nonlabored ventilation and respiratory function stable Cardiovascular status: blood pressure returned to baseline and stable Postop Assessment: no apparent nausea or vomiting Anesthetic complications: no   No notable events documented.  Last Vitals:  Vitals:   06/20/23 0920 06/20/23 0925  BP: 130/86   Pulse: 81 83  Resp: 14 18  Temp:    SpO2: 97% 96%    Last Pain:  Vitals:   06/20/23 0910  TempSrc:   PainSc: 0-No pain                 Dominick Morella,E. Loray Akard

## 2023-06-20 NOTE — Transfer of Care (Signed)
 Immediate Anesthesia Transfer of Care Note  Patient: Leon Fischer.  Procedure(s) Performed: ERCP, WITH INTERVENTION IF INDICATED  Patient Location: Endoscopy Unit  Anesthesia Type:General  Level of Consciousness: awake and patient cooperative  Airway & Oxygen Therapy: Patient Spontanous Breathing and Patient connected to nasal cannula oxygen  Post-op Assessment: Report given to RN and Post -op Vital signs reviewed and stable  Post vital signs: Reviewed and stable  Last Vitals:  Vitals Value Taken Time  BP 136/72 06/20/23 0910  Temp    Pulse 83 06/20/23 0918  Resp 15 06/20/23 0918  SpO2 93 % 06/20/23 0918  Vitals shown include unfiled device data.  Last Pain:  Vitals:   06/20/23 0910  TempSrc:   PainSc: 0-No pain         Complications: No notable events documented.

## 2023-06-20 NOTE — Anesthesia Procedure Notes (Signed)
 Procedure Name: Intubation Date/Time: 06/20/2023 8:03 AM  Performed by: Juluis Ok, CRNAPre-anesthesia Checklist: Patient identified, Emergency Drugs available, Suction available and Patient being monitored Patient Re-evaluated:Patient Re-evaluated prior to induction Oxygen Delivery Method: Circle system utilized Preoxygenation: Pre-oxygenation with 100% oxygen Induction Type: IV induction and Cricoid Pressure applied Ventilation: Mask ventilation without difficulty Laryngoscope Size: Mac and 4 Grade View: Grade I Tube type: Oral Tube size: 7.5 mm Number of attempts: 2 Airway Equipment and Method: Stylet Placement Confirmation: ETT inserted through vocal cords under direct vision, positive ETCO2, CO2 detector and breath sounds checked- equal and bilateral Secured at: 23 cm Tube secured with: Tape Dental Injury: Teeth and Oropharynx as per pre-operative assessment  Comments: Atraumatic intubation. Lips and teeth remain in preoperative condition.

## 2023-06-20 NOTE — Op Note (Signed)
 [facility] Patient Name: [facility] Procedure Date: 06/20/2023 MRN: [id number] Attending MD: [name] , MD, [id number] Date of Birth: 1990-06-30 CSN: [id number] Age: 33 Admit Type: Outpatient Procedure:                ERCP Indications:              Abnormal MRCP, fully covered metal stent in place                            for metastatic pancreatic head neuroendocrine tumor                            with sludge and stones proximal to metal stent in                            CBD Providers:                [name], MD, Bradley Caffey, Nohemi Batters,                            Technician Referring MD:             [facility], Oncology Service Medicines:                Monitored Anesthesia Care Complications:            No immediate complications. Estimated blood loss:                            Minimal. Estimated Blood Loss:     Estimated blood loss was minimal. Procedure:                Pre-Anesthesia Assessment:                           - Prior to the procedure, a History and Physical                            was performed, and patient medications and                            allergies were reviewed. The patient's tolerance of                            previous anesthesia was also reviewed. The risks                            and benefits of the procedure and the sedation                            options and risks were discussed with the patient.                            All questions were answered, and informed consent                            was obtained.  Prior Anticoagulants: The patient has                            taken Lovenox  (enoxaparin ), last dose was 1 day                            prior to procedure. ASA Grade Assessment: III - A                            patient with severe systemic disease. After                            reviewing the risks and benefits, the patient was                            deemed in satisfactory  condition to undergo the                            procedure.                           After obtaining informed consent, the scope was                            passed under direct vision. Throughout the                            procedure, the patient's blood pressure, pulse, and                            oxygen saturations were monitored continuously. The                            [id number]-Q190V ([id number]) Olympus duodenoscope was                            introduced through the mouth, and used to inject                            contrast into and used to inject contrast into the                            bile duct. The ERCP was accomplished without                            difficulty. The patient tolerated the procedure                            well. Scope In: Scope Out: Findings:      The scout film was normal. The esophagus was successfully intubated       under direct vision. The scope was advanced to a normal major papilla in       the descending duodenum without detailed examination of the pharynx,       larynx and  associated structures, and upper GI tract. The upper GI tract       was grossly normal.      Multiple ulcers were noted in the gastric antrum and duodenal bulb.      One metal stent was removed from the common bile duct using a       rat-toothed forceps and sent for cytology. It was clogged with stones,       sludge and retained food particles. There was self limited bleeding       noted at the ampulla post stent removal.      The bile duct was deeply cannulated. Contrast was injected. I personally       interpreted the bile duct images. There was brisk flow of contrast       through the ducts. Image quality was excellent. Contrast extended to the       main bile duct.      The lower third of the main bile duct and middle third of the main bile       duct contained filling defects thought to be stones and sludge.      A stricture was noted in the mid CBD.       The biliary tree was swept with a 9 mm and a 12 mm balloon starting at       the bifurcation. Sludge was swept from the duct. All stones were removed.      One 10 mm by 6 cm covered metal stent was placed 5.5 cm into the common       bile duct. Bile flowed through the stent. The stent was in good position. Impression:               - A filling defects consistent with stones and                            sludge was seen on the cholangiogram.                           - Choledocholithiasis was found. Complete removal                            was accomplished by balloon extraction.                           - One stent was removed from the common bile duct.                           - One covered metal stent was placed into the                            common bile duct.                           - The biliary tree was swept. Moderate Sedation:      Patient did not receive moderate sedation for this procedure, but       instead received monitored anesthesia care. Recommendation:           - Advance diet as tolerated. Procedure Code(s):        --- Professional ---  [phone], Endoscopic retrograde                            cholangiopancreatography (ERCP); with removal and                            exchange of stent(s), biliary or pancreatic duct,                            including pre- and post-dilation and guide wire                            passage, when performed, including sphincterotomy,                            when performed, each stent exchanged                           43264, Endoscopic retrograde                            cholangiopancreatography (ERCP); with removal of                            calculi/debris from biliary/pancreatic duct(s)                           21308, Endoscopic catheterization of the biliary                            ductal system, radiological supervision and                            interpretation Diagnosis Code(s):         --- Professional ---                           R93.2, Abnormal findings on diagnostic imaging of                            liver and biliary tract                           K80.50, Calculus of bile duct without cholangitis                            or cholecystitis without obstruction                           Z46.59, Encounter for fitting and adjustment of                            other gastrointestinal appliance and device CPT copyright 2022 [employer]. All rights reserved. The codes documented in this report are preliminary and upon coder review may  be revised to meet current compliance requirements. [name], MD 06/20/2023 9:02:00 AM This report has been signed electronically. Number  of Addenda: 0

## 2023-06-22 ENCOUNTER — Encounter (HOSPITAL_COMMUNITY): Payer: Self-pay | Admitting: Gastroenterology

## 2023-06-22 ENCOUNTER — Other Ambulatory Visit: Payer: Self-pay

## 2023-06-23 LAB — CYTOLOGY - NON PAP

## 2023-06-24 ENCOUNTER — Encounter: Payer: Self-pay | Admitting: Hematology

## 2023-07-03 ENCOUNTER — Other Ambulatory Visit: Payer: Self-pay | Admitting: Hematology

## 2023-07-03 ENCOUNTER — Other Ambulatory Visit: Payer: Self-pay

## 2023-07-03 DIAGNOSIS — C7A1 Malignant poorly differentiated neuroendocrine tumors: Secondary | ICD-10-CM

## 2023-07-03 DIAGNOSIS — Z7189 Other specified counseling: Secondary | ICD-10-CM

## 2023-07-04 ENCOUNTER — Other Ambulatory Visit: Payer: Self-pay

## 2023-07-10 ENCOUNTER — Other Ambulatory Visit: Payer: Self-pay

## 2023-07-13 NOTE — Progress Notes (Signed)
 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   HISTORY OF PRESENTING ILLNESS:  Please see previous note for details on initial presentation  INTERVAL HISTORY:  [facility]. Is a 33 y.o. 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   I last connected with patient via telemedicine visit on 06/11/2023.  Patient was recently hospitalized with significantly elevated liver function tests which were noted to be due to obstruction of his CBD stent.  Imaging did not show any overt progression of close metastatic neuroendocrine tumor. He has not had ERCP with stent change and his LFTs have nearly resolved. Patient notes no abdominal symptoms no fevers no chills no night sweats. Is here for his last cycle of palliative FOLFIRI and feels motivated to continue palliative chemotherapy. No other acute toxicities at this time.    MEDICAL HISTORY:  Past Medical History:  Diagnosis Date   Asthma    Neuroendocrine cancer (HCC)     SURGICAL HISTORY: Past Surgical History:  Procedure Laterality Date   BILIARY DILATION  07/13/2021   Procedure: BILIARY DILATION;  Surgeon: [facility] Blunt, MD;  [facility];  Service: Gastroenterology;;   BILIARY STENT PLACEMENT N/A 07/13/2021   Procedure: BILIARY STENT PLACEMENT;  Surgeon: Leon Blunt, MD;  Location: [name];  Service: Gastroenterology;  Laterality: N/A;   BILIARY STENT PLACEMENT N/A 11/05/2021   Procedure: BILIARY STENT PLACEMENT;  Surgeon: Leon Blunt, MD;  Location: [name];  Service: Gastroenterology;  Laterality: N/A;   BIOPSY  07/13/2021    Procedure: BIOPSY;  Surgeon: Leon Campi Albino Alu., MD;  [facility];  Service: Gastroenterology;;   CHOLECYSTECTOMY N/A 11/07/2021   Procedure: LAPAROSCOPIC CHOLECYSTECTOMY;  Surgeon: Leon Acton, MD;  Location: [facility];  Service: General;  Laterality: N/A;   ENDOSCOPIC RETROGRADE CHOLANGIOPANCREATOGRAPHY (ERCP) WITH PROPOFOL  N/A 07/13/2021   Procedure: ENDOSCOPIC RETROGRADE CHOLANGIOPANCREATOGRAPHY (ERCP) WITH PROPOFOL ;  Surgeon: [facility] Blunt, MD;  Location: [name];  Service: Gastroenterology;  Laterality: N/A;   ERCP N/A 11/05/2021   Procedure: ENDOSCOPIC RETROGRADE CHOLANGIOPANCREATOGRAPHY (ERCP);  Surgeon: [facility] Blunt, MD;  [facility];  Service: Gastroenterology;  Laterality: N/A;   ERCP N/A 06/20/2023   Procedure: ERCP, WITH INTERVENTION IF INDICATED;  Surgeon: [facility] Ken, MD;  Location: [name];  Service: Gastroenterology;  Laterality: N/A;   ESOPHAGOGASTRODUODENOSCOPY (EGD) WITH PROPOFOL  N/A 07/13/2021   Procedure: ESOPHAGOGASTRODUODENOSCOPY (EGD) WITH PROPOFOL ;  Surgeon: [facility] Campi Albino Alu., MD;  Location: [name];  Service: Gastroenterology;  Laterality: N/A;   EUS N/A 07/13/2021   Procedure: UPPER ENDOSCOPIC ULTRASOUND (EUS) LINEAR;  Surgeon: [facility]., MD;  Location: [name];  Service: Gastroenterology;  Laterality: N/A;   FINE NEEDLE ASPIRATION  07/13/2021   Procedure: FINE NEEDLE ASPIRATION (FNA) LINEAR;  Surgeon: [facility]., MD;  [facility];  Service: Gastroenterology;;   IR IMAGING GUIDED PORT INSERTION  07/31/2021   SPHINCTEROTOMY  07/13/2021   Procedure: [facility];  Surgeon: Leon Blunt, MD;  Location: [name];  Service: Gastroenterology;;    SOCIAL HISTORY: Social History   Socioeconomic History   Marital status: Single    Spouse name: Not on file   Number of children: 0   Years of education: Not on file   Highest education level:  Some college, no degree  Occupational History    Not on file  Tobacco Use   Smoking status: Some Days    Types: Cigarettes   Smokeless tobacco: Never  Vaping Use   Vaping status: Never Used  Substance and Sexual Activity   Alcohol use: Yes    Comment: social   Drug use: No   Sexual activity: Never  Other Topics Concern   Not on file  Social History Narrative   Not on file   Social Drivers of Health   Financial Resource Strain: Low Risk  (04/14/2017)   Overall Financial Resource Strain (CARDIA)    Difficulty of Paying Living Expenses: Not hard at all  Food Insecurity: No Food Insecurity (06/18/2023)   Hunger Vital Sign    Worried About Running Out of Food in the Last Year: Never true    Ran Out of Food in the Last Year: Never true  Transportation Needs: No Transportation Needs (06/18/2023)   PRAPARE - Administrator, Civil Service (Medical): No    Lack of Transportation (Non-Medical): No  Physical Activity: Inactive (04/14/2017)   Exercise Vital Sign    Days of Exercise per Week: 0 days    Minutes of Exercise per Session: 0 min  Stress: Stress Concern Present (04/14/2017)   [facility] [employer] - Occupational Stress Questionnaire    Feeling of Stress : Rather much  Social Connections: Unknown (06/19/2021)   Received from [facility]   Social Network    Social Network: Not on file  Intimate Partner Violence: Not At Risk (06/18/2023)   Humiliation, Afraid, Rape, and Kick questionnaire    Fear of Current or Ex-Partner: No    Emotionally Abused: No    Physically Abused: No    Sexually Abused: No    FAMILY HISTORY: Family History  Problem Relation Age of Onset   Drug abuse Father    ALLERGIES:  is allergic to shellfish allergy.  MEDICATIONS:  Current Outpatient Medications  Medication Sig Dispense Refill   acetaminophen  (TYLENOL ) 500 MG tablet Take 1,000 mg by mouth every 6 (six) hours as needed for mild pain.     albuterol  (VENTOLIN  HFA) 108 (90 Base) MCG/ACT inhaler Inhale 1-2 puffs  into the lungs every 6 (six) hours as needed for wheezing or shortness of breath. 18 g 0   methocarbamol  (ROBAXIN ) 500 MG tablet Take 1 tablet (500 mg total) by mouth 2 (two) times daily. (Patient taking differently: Take 500 mg by mouth 2 (two) times daily as needed for muscle spasms.) 20 tablet 0   ondansetron  (ZOFRAN ) 8 MG tablet Take 1 tablet (8 mg total) by mouth every 8 (eight) hours as needed for nausea, vomiting or refractory nausea / vomiting. Start on the third day after chemotherapy. 30 tablet 1   No current facility-administered medications for this visit.    REVIEW OF SYSTEMS:    10 Point review of Systems was done is negative except as noted above.   PHYSICAL EXAMINATION:   ECOG PERFORMANCE STATUS: 1 - Symptomatic but completely ambulatory .BP 127/82   Pulse 95   Temp 98.1 F (36.7 C)   Resp 20   Wt 290 lb 14.4 oz (132 kg)   SpO2 97%   BMI 39.45 kg/m    GENERAL:alert, in no acute distress and comfortable SKIN: no acute rashes, no significant lesions EYES: conjunctiva are pink and non-injected, sclera anicteric OROPHARYNX: MMM, no exudates, no oropharyngeal erythema or ulceration NECK: supple, no JVD LYMPH:  no palpable lymphadenopathy in the cervical, axillary or inguinal regions LUNGS: clear to auscultation b/l with normal respiratory effort HEART: regular rate & rhythm ABDOMEN:  normoactive bowel sounds , non tender, not distended.  Palpable hepatomegaly\ Extremity: no pedal edema PSYCH: alert & oriented x 3 with fluent speech NEURO: no focal motor/sensory deficits   LABORATORY DATA:  I have reviewed the data as listed    Latest Ref Rng & Units 07/14/2023   11:01 AM 06/19/2023    9:19 AM 06/18/2023    7:39 PM  CBC  WBC 4.0 - 10.5 K/uL 5.2  4.4    Hemoglobin 13.0 - 17.0 g/dL 40.9  81.1  91.4   Hematocrit 39.0 - 52.0 % 35.7  32.8  34.0   Platelets 150 - 400 K/uL 191  210    .    Latest Ref Rng & Units 07/14/2023   11:01 AM 06/20/2023    4:07 AM 06/19/2023     9:19 AM  CMP  Glucose 70 - 99 mg/dL 782  956  213   BUN 6 - 20 mg/dL 9  7  7    Creatinine 0.61 - 1.24 mg/dL 0.86  5.78  4.69   Sodium 135 - 145 mmol/L 141  135  138   Potassium 3.5 - 5.1 mmol/L 3.7  3.4  3.6   Chloride 98 - 111 mmol/L 108  106  106   CO2 22 - 32 mmol/L 27  20  24    Calcium  8.9 - 10.3 mg/dL 9.4  8.6  9.0   Total Protein 6.5 - 8.1 g/dL 8.0  7.6  7.7   Total Bilirubin 0.0 - 1.2 mg/dL 1.7  8.1  8.5   Alkaline Phos 38 - 126 U/L 389  649  624   AST 15 - 41 U/L 41  163  178   ALT 0 - 44 U/L 97  158  157     Cytology done 07/13/2021 revealed FINAL MICROSCOPIC DIAGNOSIS:  A. PANCREAS, HEAD, FINE NEEDLE ASPIRATION:  - Malignant cells present  - Poorly differentiated/high-grade neuroendocrine carcinoma (see  comment)   B. CBD STRICTURE, BRUSHING:  - Atypical cells suspicious for tumor   C. BILIARY DILATION, BALLOON, REMOVAL:  - Atypical cells suspicious for tumor   Surgical pathology done 07/13/2021 revealed FINAL MICROSCOPIC DIAGNOSIS:   A. STOMACH, BIOPSY:  Reactive gastropathy and minimal chronic gastritis with lymphoid  aggregate  Negative for H. pylori, intestinal metaplasia, dysplasia and carcinoma   Pathology 11/07/21: FINAL MICROSCOPIC DIAGNOSIS:   A. GALLBLADDER, CHOLECYSTECTOMY:  - Acute cholecystitis with necrosis and abscess.   RADIOGRAPHIC STUDIES: I have personally reviewed the radiological images as listed and agreed with the findings in the report. No results found.      ASSESSMENT & PLAN:   33 y.o. very pleasant male with  1. 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 -Cytology done 07/13/2021 shows presence of malignant cells in pancreas and poorly differentiated/high grade neuroendocrine carcinoma.  2.  Hx of  hospitalization from pneumonia likely related to mycoplasma.  Respiratory viral panel showed rhinovirus/enterovirus.  3.   Hyperpigmentation from 5-FU chemotherapy  4.  Financial and work-related stressors.  5.  Recent hospitalization with abnormal LFTs related to CBD blockade by biliary stones and sludge.  S/p ERCP with stent replacement. His LFTs are much improved and are resolving.  PLAN:  -discussed labs from today, 07/14/2023, in detail with patient - CBC shows resolved  anemia -CMP shows rapidly improving LFTs status post decompression of his CBD with removal of his old stent which was blocked with sludge and stones and placement of a new metallic stent. - He will follow-up with his gastroenterologist for biliary stent management as per the recommendations. - No abdominal symptoms at this time and recent imaging did not show any signs of progression. - Patient is keen to continue palliative chemotherapy medication continue with palliative FOLFIRI now that we are more certain that the abnormal liver function tests were not primarily from his chemotherapy but related to CBD obstruction by stones. - Work notes provided.  FOLLOW-UP: Per integrated scheduling  The total time spent in the appointment was 30 minutes* .  All of the patient's questions were answered with apparent satisfaction. The patient knows to call the clinic with any problems, questions or concerns.   Jacquelyn Matt MD MS AAHIVMS [facility] Hematology/Oncology Physician [facility]  .*Total Encounter Time as defined by the Centers for Medicare and Medicaid Services includes, in addition to the face-to-face time of a patient visit (documented in the note above) non-face-to-face time: obtaining and reviewing outside history, ordering and reviewing medications, tests or procedures, care coordination (communications with other health care professionals or caregivers) and documentation in the medical record.    I,Mitra Faeizi,acting as a Neurosurgeon for Jacquelyn Matt, MD.,have documented all relevant documentation on the behalf of Jacquelyn Matt,  MD,as directed by  Jacquelyn Matt, MD while in the presence of Jacquelyn Matt, MD.  .I have reviewed the above documentation for accuracy and completeness, and I agree with the above. .Corianne Buccellato Kishore Charnette Younkin MD

## 2023-07-14 ENCOUNTER — Inpatient Hospital Stay

## 2023-07-14 ENCOUNTER — Inpatient Hospital Stay (HOSPITAL_BASED_OUTPATIENT_CLINIC_OR_DEPARTMENT_OTHER): Admitting: Hematology

## 2023-07-14 ENCOUNTER — Inpatient Hospital Stay: Attending: Hematology

## 2023-07-14 VITALS — BP 127/82 | HR 95 | Temp 98.1°F | Resp 20 | Wt 290.9 lb

## 2023-07-14 DIAGNOSIS — C7A1 Malignant poorly differentiated neuroendocrine tumors: Secondary | ICD-10-CM

## 2023-07-14 DIAGNOSIS — Z5111 Encounter for antineoplastic chemotherapy: Secondary | ICD-10-CM | POA: Insufficient documentation

## 2023-07-14 DIAGNOSIS — F17210 Nicotine dependence, cigarettes, uncomplicated: Secondary | ICD-10-CM | POA: Insufficient documentation

## 2023-07-14 DIAGNOSIS — Z95828 Presence of other vascular implants and grafts: Secondary | ICD-10-CM

## 2023-07-14 DIAGNOSIS — R7989 Other specified abnormal findings of blood chemistry: Secondary | ICD-10-CM | POA: Diagnosis not present

## 2023-07-14 DIAGNOSIS — Z7189 Other specified counseling: Secondary | ICD-10-CM

## 2023-07-14 LAB — CMP (CANCER CENTER ONLY)
ALT: 97 U/L — ABNORMAL HIGH (ref 0–44)
AST: 41 U/L (ref 15–41)
Albumin: 4.1 g/dL (ref 3.5–5.0)
Alkaline Phosphatase: 389 U/L — ABNORMAL HIGH (ref 38–126)
Anion gap: 6 (ref 5–15)
BUN: 9 mg/dL (ref 6–20)
CO2: 27 mmol/L (ref 22–32)
Calcium: 9.4 mg/dL (ref 8.9–10.3)
Chloride: 108 mmol/L (ref 98–111)
Creatinine: 0.68 mg/dL (ref 0.61–1.24)
GFR, Estimated: >60 mL/min (ref >60)
Glucose, Bld: 103 mg/dL — ABNORMAL HIGH (ref 70–99)
Potassium: 3.7 mmol/L (ref 3.5–5.1)
Sodium: 141 mmol/L (ref 135–145)
Total Bilirubin: 1.7 mg/dL — ABNORMAL HIGH (ref 0.0–1.2)
Total Protein: 8 g/dL (ref 6.5–8.1)

## 2023-07-14 LAB — CBC WITH DIFFERENTIAL (CANCER CENTER ONLY)
Abs Immature Granulocytes: 0.01 10*3/uL (ref 0.00–0.07)
Basophils Absolute: 0 10*3/uL (ref 0.0–0.1)
Basophils Relative: 0 %
Eosinophils Absolute: 0.4 10*3/uL (ref 0.0–0.5)
Eosinophils Relative: 9 %
HCT: 35.7 % — ABNORMAL LOW (ref 39.0–52.0)
Hemoglobin: 12.1 g/dL — ABNORMAL LOW (ref 13.0–17.0)
Immature Granulocytes: 0 %
Lymphocytes Relative: 12 %
Lymphs Abs: 0.6 10*3/uL — ABNORMAL LOW (ref 0.7–4.0)
MCH: 29.6 pg (ref 26.0–34.0)
MCHC: 33.9 g/dL (ref 30.0–36.0)
MCV: 87.3 fL (ref 80.0–100.0)
Monocytes Absolute: 0.6 10*3/uL (ref 0.1–1.0)
Monocytes Relative: 11 %
Neutro Abs: 3.5 10*3/uL (ref 1.7–7.7)
Neutrophils Relative %: 68 %
Platelet Count: 191 10*3/uL (ref 150–400)
RBC: 4.09 MIL/uL — ABNORMAL LOW (ref 4.22–5.81)
RDW: 14.2 % (ref 11.5–15.5)
WBC Count: 5.2 10*3/uL (ref 4.0–10.5)
nRBC: 0 % (ref 0.0–0.2)

## 2023-07-14 MED ORDER — SODIUM CHLORIDE 0.9 % IV SOLN
125.0000 mg/m2 | Freq: Once | INTRAVENOUS | Status: AC
  Administered 2023-07-14: 300 mg via INTRAVENOUS
  Filled 2023-07-14: qty 15

## 2023-07-14 MED ORDER — PALONOSETRON HCL INJECTION 0.25 MG/5ML
0.2500 mg | Freq: Once | INTRAVENOUS | Status: AC
  Administered 2023-07-14: 0.25 mg via INTRAVENOUS
  Filled 2023-07-14: qty 5

## 2023-07-14 MED ORDER — SODIUM CHLORIDE 0.9 % IV SOLN
2400.0000 mg/m2 | INTRAVENOUS | Status: DC
  Administered 2023-07-14: 6100 mg via INTRAVENOUS
  Filled 2023-07-14: qty 122

## 2023-07-14 MED ORDER — SODIUM CHLORIDE 0.9% FLUSH
10.0000 mL | Freq: Once | INTRAVENOUS | Status: AC
  Administered 2023-07-14: 10 mL

## 2023-07-14 MED ORDER — FLUOROURACIL CHEMO INJECTION 2.5 GM/50ML
400.0000 mg/m2 | Freq: Once | INTRAVENOUS | Status: AC
  Administered 2023-07-14: 1000 mg via INTRAVENOUS
  Filled 2023-07-14: qty 20

## 2023-07-14 MED ORDER — SODIUM CHLORIDE 0.9 % IV SOLN: Freq: Once | INTRAVENOUS | Status: AC

## 2023-07-14 MED ORDER — DEXAMETHASONE SODIUM PHOSPHATE 10 MG/ML IJ SOLN
10.0000 mg | Freq: Once | INTRAMUSCULAR | Status: AC
  Administered 2023-07-14: 10 mg via INTRAVENOUS
  Filled 2023-07-14: qty 1

## 2023-07-14 MED ORDER — HEPARIN SOD (PORK) LOCK FLUSH 100 UNIT/ML IV SOLN
500.0000 [IU] | Freq: Once | INTRAVENOUS | Status: DC | PRN
Start: 2023-07-14 — End: 2023-07-14

## 2023-07-14 MED ORDER — SODIUM CHLORIDE 0.9 % IV SOLN
400.0000 mg/m2 | Freq: Once | INTRAVENOUS | Status: AC
  Administered 2023-07-14: 1020 mg via INTRAVENOUS
  Filled 2023-07-14: qty 51

## 2023-07-14 MED ORDER — SODIUM CHLORIDE 0.9% FLUSH
10.0000 mL | INTRAVENOUS | Status: DC | PRN
  Administered 2023-07-14: 10 mL

## 2023-07-14 NOTE — Progress Notes (Signed)
 Patient seen by Dr. Kale  Vitals are not all within treatment parameters.   BP 127/92 MD aware  Labs reviewed: and are within treatment parameters. CMP pending  Per physician team, patient is ready for treatment and there are NO modifications to the treatment plan.

## 2023-07-14 NOTE — Progress Notes (Signed)
 Pt ok for tx today with : T Bili: 1.7, ALT 97, Alkaline Phos: 389 per Dr Salomon Cree

## 2023-07-16 ENCOUNTER — Inpatient Hospital Stay

## 2023-07-16 VITALS — BP 139/87 | HR 88 | Temp 98.9°F | Resp 16

## 2023-07-16 DIAGNOSIS — Z5111 Encounter for antineoplastic chemotherapy: Secondary | ICD-10-CM | POA: Diagnosis not present

## 2023-07-16 DIAGNOSIS — C7A1 Malignant poorly differentiated neuroendocrine tumors: Secondary | ICD-10-CM

## 2023-07-16 DIAGNOSIS — Z7189 Other specified counseling: Secondary | ICD-10-CM

## 2023-07-16 DIAGNOSIS — Z95828 Presence of other vascular implants and grafts: Secondary | ICD-10-CM

## 2023-07-16 MED ORDER — SODIUM CHLORIDE 0.9% FLUSH: 10.0000 mL | Freq: Once | INTRAVENOUS | Status: AC

## 2023-07-16 MED ORDER — HEPARIN SOD (PORK) LOCK FLUSH 100 UNIT/ML IV SOLN: 500.0000 [IU] | Freq: Once | INTRAVENOUS | Status: AC

## 2023-07-16 MED ORDER — SODIUM CHLORIDE 0.9% FLUSH
10.0000 mL | INTRAVENOUS | Status: DC | PRN
  Administered 2023-07-16: 10 mL

## 2023-07-16 MED ORDER — HEPARIN SOD (PORK) LOCK FLUSH 100 UNIT/ML IV SOLN
500.0000 [IU] | Freq: Once | INTRAVENOUS | Status: AC | PRN
  Administered 2023-07-16: 500 [IU]

## 2023-07-20 ENCOUNTER — Encounter: Payer: Self-pay | Admitting: Hematology

## 2023-07-30 ENCOUNTER — Inpatient Hospital Stay

## 2023-07-30 ENCOUNTER — Inpatient Hospital Stay (HOSPITAL_BASED_OUTPATIENT_CLINIC_OR_DEPARTMENT_OTHER): Admitting: Physician Assistant

## 2023-07-30 VITALS — BP 125/85 | HR 85 | Temp 98.3°F | Resp 18 | Ht 72.0 in | Wt 295.4 lb

## 2023-07-30 DIAGNOSIS — Z7189 Other specified counseling: Secondary | ICD-10-CM

## 2023-07-30 DIAGNOSIS — Z5111 Encounter for antineoplastic chemotherapy: Secondary | ICD-10-CM

## 2023-07-30 DIAGNOSIS — Z95828 Presence of other vascular implants and grafts: Secondary | ICD-10-CM

## 2023-07-30 DIAGNOSIS — C7A1 Malignant poorly differentiated neuroendocrine tumors: Secondary | ICD-10-CM | POA: Diagnosis not present

## 2023-07-30 LAB — CBC WITH DIFFERENTIAL (CANCER CENTER ONLY)
Abs Immature Granulocytes: 0.01 10*3/uL (ref 0.00–0.07)
Basophils Absolute: 0 10*3/uL (ref 0.0–0.1)
Basophils Relative: 1 %
Eosinophils Absolute: 0.3 10*3/uL (ref 0.0–0.5)
Eosinophils Relative: 7 %
HCT: 35.3 % — ABNORMAL LOW (ref 39.0–52.0)
Hemoglobin: 12 g/dL — ABNORMAL LOW (ref 13.0–17.0)
Immature Granulocytes: 0 %
Lymphocytes Relative: 16 %
Lymphs Abs: 0.6 10*3/uL — ABNORMAL LOW (ref 0.7–4.0)
MCH: 29.4 pg (ref 26.0–34.0)
MCHC: 34 g/dL (ref 30.0–36.0)
MCV: 86.5 fL (ref 80.0–100.0)
Monocytes Absolute: 0.4 10*3/uL (ref 0.1–1.0)
Monocytes Relative: 11 %
Neutro Abs: 2.5 10*3/uL (ref 1.7–7.7)
Neutrophils Relative %: 65 %
Platelet Count: 182 10*3/uL (ref 150–400)
RBC: 4.08 MIL/uL — ABNORMAL LOW (ref 4.22–5.81)
RDW: 13.9 % (ref 11.5–15.5)
WBC Count: 3.8 10*3/uL — ABNORMAL LOW (ref 4.0–10.5)
nRBC: 0 % (ref 0.0–0.2)

## 2023-07-30 LAB — CMP (CANCER CENTER ONLY)
ALT: 49 U/L — ABNORMAL HIGH (ref 0–44)
AST: 35 U/L (ref 15–41)
Albumin: 4.1 g/dL (ref 3.5–5.0)
Alkaline Phosphatase: 172 U/L — ABNORMAL HIGH (ref 38–126)
Anion gap: 5 (ref 5–15)
BUN: 10 mg/dL (ref 6–20)
CO2: 28 mmol/L (ref 22–32)
Calcium: 9.2 mg/dL (ref 8.9–10.3)
Chloride: 107 mmol/L (ref 98–111)
Creatinine: 0.7 mg/dL (ref 0.61–1.24)
GFR, Estimated: >60 mL/min (ref >60)
Glucose, Bld: 124 mg/dL — ABNORMAL HIGH (ref 70–99)
Potassium: 3.7 mmol/L (ref 3.5–5.1)
Sodium: 140 mmol/L (ref 135–145)
Total Bilirubin: 1 mg/dL (ref 0.0–1.2)
Total Protein: 7.6 g/dL (ref 6.5–8.1)

## 2023-07-30 MED ORDER — SODIUM CHLORIDE 0.9 % IV SOLN: Freq: Once | INTRAVENOUS | Status: AC

## 2023-07-30 MED ORDER — PALONOSETRON HCL INJECTION 0.25 MG/5ML
0.2500 mg | Freq: Once | INTRAVENOUS | Status: AC
  Administered 2023-07-30: 0.25 mg via INTRAVENOUS
  Filled 2023-07-30: qty 5

## 2023-07-30 MED ORDER — FLUOROURACIL CHEMO INJECTION 2.5 GM/50ML
400.0000 mg/m2 | Freq: Once | INTRAVENOUS | Status: AC
  Administered 2023-07-30: 1000 mg via INTRAVENOUS
  Filled 2023-07-30: qty 20

## 2023-07-30 MED ORDER — SODIUM CHLORIDE 0.9 % IV SOLN
400.0000 mg/m2 | Freq: Once | INTRAVENOUS | Status: AC
  Administered 2023-07-30: 1020 mg via INTRAVENOUS
  Filled 2023-07-30: qty 51

## 2023-07-30 MED ORDER — DEXAMETHASONE SODIUM PHOSPHATE 10 MG/ML IJ SOLN
10.0000 mg | Freq: Once | INTRAMUSCULAR | Status: AC
  Administered 2023-07-30: 10 mg via INTRAVENOUS
  Filled 2023-07-30: qty 1

## 2023-07-30 MED ORDER — ATROPINE SULFATE 1 MG/ML IV SOLN
0.5000 mg | Freq: Once | INTRAVENOUS | Status: DC | PRN
Start: 2023-07-30 — End: 2023-07-30

## 2023-07-30 MED ORDER — SODIUM CHLORIDE 0.9% FLUSH
10.0000 mL | Freq: Once | INTRAVENOUS | Status: AC
  Administered 2023-07-30: 10 mL

## 2023-07-30 MED ORDER — SODIUM CHLORIDE 0.9 % IV SOLN
125.0000 mg/m2 | Freq: Once | INTRAVENOUS | Status: AC
  Administered 2023-07-30: 300 mg via INTRAVENOUS
  Filled 2023-07-30: qty 15

## 2023-07-30 MED ORDER — SODIUM CHLORIDE 0.9 % IV SOLN
2400.0000 mg/m2 | INTRAVENOUS | Status: DC
  Administered 2023-07-30: 6100 mg via INTRAVENOUS
  Filled 2023-07-30: qty 122

## 2023-07-30 NOTE — Progress Notes (Signed)
 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  04/24/2022-07/17/2022: Received 6 cycles of FOLFOX, oxaliplatin  discontinued with 6th cycle due to elevated LFTs.   CURRENT TREATMENT: FOLFIRI, started 07/31/2022  INTERVAL HISTORY:  Leon Fischer. Is a 33 y.o. male returns today for a follow up for high grade neuroendocrine carcinoma prior to Cycle 21, Day 1 of FOLFIRI chemotherapy.He is accompanied by his mother for this visit.   Leon Fischer reports continues to tolerate chemotherapy. His energy and appetite are stable. He is able to complete all his ADLs on his own. He denies nausea, vomiting or bowel habit changes. He denies easy bruising or signs of bleeding. He denies fevers, chills, night sweats, shortness of breath, chest pain or cough. He has no other complaints.  MEDICAL HISTORY:  Past Medical History:  Diagnosis Date   Asthma    Neuroendocrine cancer (HCC)     SURGICAL HISTORY: Past Surgical History:  Procedure Laterality Date   BILIARY DILATION  07/13/2021   Procedure: BILIARY DILATION;  Surgeon: [name], MD;  Location: [name];  Service: Gastroenterology;;   BILIARY STENT PLACEMENT N/A 07/13/2021   Procedure: BILIARY STENT PLACEMENT;  Surgeon: [name], MD;  Location: [name];  Service: Gastroenterology;  Laterality: N/A;   BILIARY STENT PLACEMENT N/A 11/05/2021   Procedure: BILIARY STENT PLACEMENT;  Surgeon: [name], MD;  Location: [name];  Service: Gastroenterology;  Laterality: N/A;   BIOPSY  07/13/2021   Procedure: BIOPSY;  Surgeon: [name]., MD;  Location: [name];  Service: Gastroenterology;;   CHOLECYSTECTOMY N/A 11/07/2021   Procedure: LAPAROSCOPIC CHOLECYSTECTOMY;  Surgeon: [name], MD;  Location: [facility];  Service: General;  Laterality: N/A;   ENDOSCOPIC RETROGRADE CHOLANGIOPANCREATOGRAPHY (ERCP) WITH PROPOFOL  N/A 07/13/2021   Procedure: ENDOSCOPIC RETROGRADE CHOLANGIOPANCREATOGRAPHY (ERCP) WITH PROPOFOL ;  Surgeon: [name], MD;  [name];  Service: Gastroenterology;  Laterality: N/A;   ERCP N/A 11/05/2021   Procedure: ENDOSCOPIC RETROGRADE CHOLANGIOPANCREATOGRAPHY (ERCP);  Surgeon: [name], MD;  Location: [name];  Service: Gastroenterology;  Laterality: N/A;   ERCP N/A 06/20/2023   Procedure: ERCP, WITH INTERVENTION IF INDICATED;  Surgeon: Saintclair Jasper, MD;  [name];  Service: Gastroenterology;  Laterality: N/A;   ESOPHAGOGASTRODUODENOSCOPY (EGD) WITH PROPOFOL  N/A 07/13/2021   Procedure: ESOPHAGOGASTRODUODENOSCOPY (EGD) WITH PROPOFOL ;  Surgeon: [name]., MD;  [name];  Service: Gastroenterology;  Laterality: N/A;   EUS N/A 07/13/2021   Procedure: UPPER ENDOSCOPIC ULTRASOUND (EUS) LINEAR;  Surgeon: [name]., MD;  Location[name];  Service: Gastroenterology;  Laterality: N/A;   FINE NEEDLE ASPIRATION  07/13/2021   Procedure: FINE NEEDLE ASPIRATION (FNA) LINEAR;  Surgeon: [name]., MD;  Location: [name];  Service: Gastroenterology;;   IR IMAGING GUIDED PORT INSERTION  07/31/2021   SPHINCTEROTOMY  07/13/2021   Procedure: ANNETT;  Surgeon: [name], MD;  Location: [name];  Service: Gastroenterology;;    SOCIAL HISTORY: Social History   Socioeconomic History   Marital status: Single    Spouse name: Not on file   Number of children: 0   Years of education: Not on file   Highest education level: Some college, no degree  Occupational History   Not on file  Tobacco Use   Smoking status: Some  Days    Types: Cigarettes   Smokeless tobacco: Never  Vaping Use   Vaping status: Never Used  Substance and Sexual Activity   Alcohol use: Yes    Comment: social   Drug use: No   Sexual activity: Never  Other Topics Concern   Not on file  Social History Narrative   Not on file   Social Drivers of Health   Financial Resource Strain: Low Risk  (04/14/2017)   Overall Financial Resource Strain (CARDIA)    Difficulty of Paying Living Expenses: Not hard at all  Food Insecurity: No Food Insecurity (06/18/2023)   Hunger Vital Sign    Worried About Running Out of Food in the Last Year: Never true    Ran Out of Food in the Last Year: Never true  Transportation Needs: No Transportation Needs (06/18/2023)   PRAPARE - Administrator, Civil Service (Medical): No    Lack of Transportation (Non-Medical): No  Physical Activity: Inactive (04/14/2017)   Exercise Vital Sign    Days of Exercise per Week: 0 days    Minutes of Exercise per Session: 0 min  Stress: Stress Concern Present (04/14/2017)   [name] of Occupational Health - Occupational Stress Questionnaire    Feeling of Stress : Rather much  Social Connections: Unknown (06/19/2021)   Received from [facility]   Social Network    Social Network: Not on file  Intimate Partner Violence: Not At Risk (06/18/2023)   Humiliation, Afraid, Rape, and Kick questionnaire    Fear of Current or Ex-Partner: No    Emotionally Abused: No    Physically Abused: No    Sexually Abused: No    FAMILY HISTORY: Family History  Problem Relation Age of Onset   Drug abuse Father    ALLERGIES:  is allergic to shellfish allergy.  MEDICATIONS:  Current Outpatient Medications  Medication Sig Dispense Refill   acetaminophen  (TYLENOL ) 500 MG tablet Take 1,000 mg by mouth every 6 (six) hours as needed for mild pain.     albuterol  (VENTOLIN  HFA) 108 (90 Base) MCG/ACT inhaler Inhale 1-2 puffs into the lungs every 6 (six) hours as needed for  wheezing or shortness of breath. 18 g 0   methocarbamol  (ROBAXIN ) 500 MG tablet Take 1 tablet (500 mg total) by mouth 2 (two) times daily. (Patient taking differently: Take 500 mg by mouth 2 (two) times daily as needed for muscle spasms.) 20 tablet 0   ondansetron  (ZOFRAN ) 8 MG tablet Take 1 tablet (8 mg total) by mouth every 8 (eight) hours as needed for nausea, vomiting or refractory nausea / vomiting. Start on the third day after chemotherapy. 30 tablet 1   No current facility-administered medications for this visit.   Facility-Administered Medications Ordered in Other Visits  Medication Dose Route Frequency Provider Last Rate Last Admin   0.9 %  sodium chloride  infusion   Intravenous Once Kale, Gautam Kishore, MD       atropine  injection 0.5 mg  0.5 mg Intravenous Once PRN Kale, Gautam Kishore, MD       dexamethasone  (DECADRON ) injection 10 mg  10 mg Intravenous Once Kale, Gautam Kishore, MD       fluorouracil  (ADRUCIL ) 6,100 mg in sodium chloride  0.9 % 128 mL chemo infusion  2,400 mg/m2 (Treatment Plan Recorded) Intravenous 1 day or 1 dose Kale, Gautam Kishore, MD       fluorouracil  (  ADRUCIL ) chemo injection 1,000 mg  400 mg/m2 (Treatment Plan Recorded) Intravenous Once Kale, Gautam Kishore, MD       irinotecan  (CAMPTOSAR ) 300 mg in sodium chloride  0.9 % 500 mL chemo infusion  125 mg/m2 (Treatment Plan Recorded) Intravenous Once Kale, Gautam Kishore, MD       leucovorin  1,020 mg in sodium chloride  0.9 % 250 mL infusion  400 mg/m2 (Treatment Plan Recorded) Intravenous Once Kale, Gautam Kishore, MD       palonosetron  (ALOXI ) injection 0.25 mg  0.25 mg Intravenous Once Kale, Gautam Kishore, MD        REVIEW OF SYSTEMS:    10 Point review of Systems was done is negative except as noted above.  PHYSICAL EXAMINATION: ECOG PERFORMANCE STATUS: 1 - Symptomatic but completely ambulatory Vitals:   07/30/23 1118  BP: 125/85  Pulse: 85  Resp: 18  Temp: 98.3 F (36.8 C)  SpO2: 99%     GENERAL:alert, in no acute distress and comfortable SKIN: no acute rashes, no significant lesions EYES: conjunctiva are pink and non-injected, sclera anicteric NECK: supple, no JVD LUNGS: clear to auscultation b/l with normal respiratory effort HEART: regular rate & rhythm Extremity: no pedal edema PSYCH: alert & oriented x 3 with fluent speech NEURO: no focal motor/sensory deficits  LABORATORY DATA:  I have reviewed the data as listed  .    Latest Ref Rng & Units 07/30/2023   10:45 AM 07/14/2023   11:01 AM 06/19/2023    9:19 AM  CBC  WBC 4.0 - 10.5 K/uL 3.8  5.2  4.4   Hemoglobin 13.0 - 17.0 g/dL 87.9  87.8  89.2   Hematocrit 39.0 - 52.0 % 35.3  35.7  32.8   Platelets 150 - 400 K/uL 182  191  210     .    Latest Ref Rng & Units 07/30/2023   10:45 AM 07/14/2023   11:01 AM 06/20/2023    4:07 AM  CMP  Glucose 70 - 99 mg/dL 875  896  899   BUN 6 - 20 mg/dL 10  9  7    Creatinine 0.61 - 1.24 mg/dL 9.29  9.31  9.28   Sodium 135 - 145 mmol/L 140  141  135   Potassium 3.5 - 5.1 mmol/L 3.7  3.7  3.4   Chloride 98 - 111 mmol/L 107  108  106   CO2 22 - 32 mmol/L 28  27  20    Calcium  8.9 - 10.3 mg/dL 9.2  9.4  8.6   Total Protein 6.5 - 8.1 g/dL 7.6  8.0  7.6   Total Bilirubin 0.0 - 1.2 mg/dL 1.0  1.7  8.1   Alkaline Phos 38 - 126 U/L 172  389  649   AST 15 - 41 U/L 35  41  163   ALT 0 - 44 U/L 49  97  158    Cytology done 07/13/2021 revealed FINAL MICROSCOPIC DIAGNOSIS:  A. PANCREAS, HEAD, FINE NEEDLE ASPIRATION:  - Malignant cells present  - Poorly differentiated/high-grade neuroendocrine carcinoma (see  comment)   B. CBD STRICTURE, BRUSHING:  - Atypical cells suspicious for tumor   C. BILIARY DILATION, BALLOON, REMOVAL:  - Atypical cells suspicious for tumor   Surgical pathology done 07/13/2021 revealed FINAL MICROSCOPIC DIAGNOSIS:   A. STOMACH, BIOPSY:  Reactive gastropathy and minimal chronic gastritis with lymphoid  aggregate  Negative for H. pylori,  intestinal metaplasia, dysplasia and carcinoma  RADIOGRAPHIC STUDIES: I have personally reviewed the radiological images  as listed and agreed with the findings in the report. No results found.   ASSESSMENT & PLAN:  Leon Fischer. Is a 33 y.o. male who presents for a follow up for high grade neuroendocrine carcinoma.   1. 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 -Cytology done 07/13/2021 shows presence of malignant cells in pancreas and poorly differentiated/high grade neuroendocrine carcinoma. -Received 6 cycles of first line chemotherapy with carboplatin  and etoposide  from 08/01/2021-12/26/2021.Treatment was discontinued after CT CAP from 04/04/2022 showed progression of disease. -Received 6 cycles of second line chemotherapy with FOLFOX from 04/24/2022-07/17/2022. Oxaliplatin  discontinued with 6th cycle due to elevated LFTs. -Started third line chemotherapy with FOLFIRI on 07/31/2022 -Most recent CT CAP from 06/18/2023 showed overall stable disease.  Plan -Due for Cycle 21, Day 1 of FOLFIRI today -Labs from today were reviewed with patient. WBC 3.8, Hgb 12.0, Plt 182K. Creatinine normal. LFTs improving since undergoing biliary stent exchange. -Proceed with treatment today without any dose modifications.  -RTC in 2 weeks for labs, follow up before Cycle 22 Day 1   All of the patient's questions were answered with apparent satisfaction. The patient knows to call the clinic with any problems, questions or concerns.  I have spent a total of 30 minutes minutes of face-to-face and non-face-to-face time, preparing to see the patient, , performing a medically appropriate examination, counseling and educating the patient,documenting clinical information in the electronic health record, and care coordination.   Johnston Police PA-C Dept of Hematology and Oncology [facility] at [facility] Phone: [phone]

## 2023-07-30 NOTE — Patient Instructions (Signed)
 [facility] - A DEPT OF [facility]  Discharge Instructions: Thank you for choosing [facility] to provide your oncology and hematology care.   If you have a lab appointment with the Cancer Center, please go directly to the Cancer Center and check in at the registration area.   Wear comfortable clothing and clothing appropriate for easy access to any Portacath or PICC line.   We strive to give you quality time with your provider. You may need to reschedule your appointment if you arrive late (15 or more minutes).  Arriving late affects you and other patients whose appointments are after yours.  Also, if you miss three or more appointments without notifying the office, you may be dismissed from the clinic at the provider's discretion.      For prescription refill requests, have your pharmacy contact our office and allow 72 hours for refills to be completed.    Today you received the following chemotherapy and/or immunotherapy agents Irinotecan, Leukovorin, 5FU      To help prevent nausea and vomiting after your treatment, we encourage you to take your nausea medication as directed.  BELOW ARE SYMPTOMS THAT SHOULD BE REPORTED IMMEDIATELY: *FEVER GREATER THAN 100.4 F (38 C) OR HIGHER *CHILLS OR SWEATING *NAUSEA AND VOMITING THAT IS NOT CONTROLLED WITH YOUR NAUSEA MEDICATION *UNUSUAL SHORTNESS OF BREATH *UNUSUAL BRUISING OR BLEEDING *URINARY PROBLEMS (pain or burning when urinating, or frequent urination) *BOWEL PROBLEMS (unusual diarrhea, constipation, pain near the anus) TENDERNESS IN MOUTH AND THROAT WITH OR WITHOUT PRESENCE OF ULCERS (sore throat, sores in mouth, or a toothache) UNUSUAL RASH, SWELLING OR PAIN  UNUSUAL VAGINAL DISCHARGE OR ITCHING   Items with * indicate a potential emergency and should be followed up as soon as possible or go to the Emergency Department if any problems should occur.  Please show the CHEMOTHERAPY ALERT CARD  or IMMUNOTHERAPY ALERT CARD at check-in to the Emergency Department and triage nurse.  Should you have questions after your visit or need to cancel or reschedule your appointment, please contact [facility] - A DE[facility]  Dept: [phone]  and follow the prompts.  Office hours are 8:00 a.m. to 4:30 p.m. Monday - Friday. Please note that voicemails left after 4:00 p.m. may not be returned until the following business day.  We are closed weekends and major holidays. You have access to a nurse at all times for urgent questions. Please call the main number to the clinic Dept: [phone] and follow the prompts.   For any non-urgent questions, you may also contact your provider using MyChart. We now offer e-Visits for anyone 39 and older to request care online for non-urgent symptoms. For details visit [email or web address].   Also download the MyChart app! Go to the app store, search "MyChart", open the app, select [facility], and log in with your MyChart username and password.

## 2023-08-01 ENCOUNTER — Inpatient Hospital Stay

## 2023-08-01 VITALS — BP 125/83 | HR 76 | Temp 98.7°F | Resp 18

## 2023-08-01 DIAGNOSIS — Z5111 Encounter for antineoplastic chemotherapy: Secondary | ICD-10-CM | POA: Diagnosis not present

## 2023-08-01 DIAGNOSIS — Z95828 Presence of other vascular implants and grafts: Secondary | ICD-10-CM

## 2023-08-01 MED ORDER — SODIUM CHLORIDE 0.9% FLUSH
10.0000 mL | Freq: Once | INTRAVENOUS | Status: AC
  Administered 2023-08-01: 10 mL

## 2023-08-01 MED ORDER — HEPARIN SOD (PORK) LOCK FLUSH 100 UNIT/ML IV SOLN
500.0000 [IU] | Freq: Once | INTRAVENOUS | Status: AC
  Administered 2023-08-01: 500 [IU]

## 2023-08-04 ENCOUNTER — Telehealth: Payer: Self-pay | Admitting: Physician Assistant

## 2023-08-04 NOTE — Telephone Encounter (Signed)
 Scheduled appointments per WQ. Talked with the patient and he is aware of all made appointments.

## 2023-08-05 ENCOUNTER — Other Ambulatory Visit: Payer: Self-pay

## 2023-08-12 ENCOUNTER — Inpatient Hospital Stay

## 2023-08-12 ENCOUNTER — Inpatient Hospital Stay: Attending: Hematology

## 2023-08-12 ENCOUNTER — Other Ambulatory Visit: Payer: Self-pay | Admitting: Hematology and Oncology

## 2023-08-12 VITALS — BP 127/85 | HR 83 | Temp 98.6°F | Resp 18 | Wt 303.5 lb

## 2023-08-12 DIAGNOSIS — C7B8 Other secondary neuroendocrine tumors: Secondary | ICD-10-CM | POA: Diagnosis not present

## 2023-08-12 DIAGNOSIS — Z5111 Encounter for antineoplastic chemotherapy: Secondary | ICD-10-CM | POA: Diagnosis present

## 2023-08-12 DIAGNOSIS — F17210 Nicotine dependence, cigarettes, uncomplicated: Secondary | ICD-10-CM | POA: Insufficient documentation

## 2023-08-12 DIAGNOSIS — C7A1 Malignant poorly differentiated neuroendocrine tumors: Secondary | ICD-10-CM | POA: Insufficient documentation

## 2023-08-12 DIAGNOSIS — Z7189 Other specified counseling: Secondary | ICD-10-CM

## 2023-08-12 DIAGNOSIS — Z95828 Presence of other vascular implants and grafts: Secondary | ICD-10-CM

## 2023-08-12 LAB — CMP (CANCER CENTER ONLY)
ALT: 22 U/L (ref 0–44)
AST: 29 U/L (ref 15–41)
Albumin: 3.8 g/dL (ref 3.5–5.0)
Alkaline Phosphatase: 118 U/L (ref 38–126)
Anion gap: 5 (ref 5–15)
BUN: 8 mg/dL (ref 6–20)
CO2: 28 mmol/L (ref 22–32)
Calcium: 8.9 mg/dL (ref 8.9–10.3)
Chloride: 108 mmol/L (ref 98–111)
Creatinine: 0.75 mg/dL (ref 0.61–1.24)
GFR, Estimated: >60 mL/min (ref >60)
Glucose, Bld: 112 mg/dL — ABNORMAL HIGH (ref 70–99)
Potassium: 3.6 mmol/L (ref 3.5–5.1)
Sodium: 141 mmol/L (ref 135–145)
Total Bilirubin: 0.7 mg/dL (ref 0.0–1.2)
Total Protein: 6.8 g/dL (ref 6.5–8.1)

## 2023-08-12 LAB — CBC WITH DIFFERENTIAL (CANCER CENTER ONLY)
Abs Immature Granulocytes: 0.01 K/uL (ref 0.00–0.07)
Basophils Absolute: 0 K/uL (ref 0.0–0.1)
Basophils Relative: 1 %
Eosinophils Absolute: 0.2 K/uL (ref 0.0–0.5)
Eosinophils Relative: 5 %
HCT: 34 % — ABNORMAL LOW (ref 39.0–52.0)
Hemoglobin: 11.2 g/dL — ABNORMAL LOW (ref 13.0–17.0)
Immature Granulocytes: 0 %
Lymphocytes Relative: 15 %
Lymphs Abs: 0.6 K/uL — ABNORMAL LOW (ref 0.7–4.0)
MCH: 28.5 pg (ref 26.0–34.0)
MCHC: 32.9 g/dL (ref 30.0–36.0)
MCV: 86.5 fL (ref 80.0–100.0)
Monocytes Absolute: 0.4 K/uL (ref 0.1–1.0)
Monocytes Relative: 9 %
Neutro Abs: 2.8 K/uL (ref 1.7–7.7)
Neutrophils Relative %: 70 %
Platelet Count: 146 K/uL — ABNORMAL LOW (ref 150–400)
RBC: 3.93 MIL/uL — ABNORMAL LOW (ref 4.22–5.81)
RDW: 13.7 % (ref 11.5–15.5)
WBC Count: 4 K/uL (ref 4.0–10.5)
nRBC: 0 % (ref 0.0–0.2)

## 2023-08-12 MED ORDER — SODIUM CHLORIDE 0.9% FLUSH: 10.0000 mL | INTRAVENOUS | Status: DC | PRN

## 2023-08-12 MED ORDER — PALONOSETRON HCL INJECTION 0.25 MG/5ML
0.2500 mg | Freq: Once | INTRAVENOUS | Status: AC
  Administered 2023-08-12: 0.25 mg via INTRAVENOUS
  Filled 2023-08-12: qty 5

## 2023-08-12 MED ORDER — SODIUM CHLORIDE 0.9 % IV SOLN: Freq: Once | INTRAVENOUS | Status: AC

## 2023-08-12 MED ORDER — DEXAMETHASONE SODIUM PHOSPHATE 10 MG/ML IJ SOLN
10.0000 mg | Freq: Once | INTRAMUSCULAR | Status: AC
  Administered 2023-08-12: 10 mg via INTRAVENOUS
  Filled 2023-08-12: qty 1

## 2023-08-12 MED ORDER — SODIUM CHLORIDE 0.9 % IV SOLN
125.0000 mg/m2 | Freq: Once | INTRAVENOUS | Status: AC
  Administered 2023-08-12: 300 mg via INTRAVENOUS
  Filled 2023-08-12: qty 15

## 2023-08-12 MED ORDER — SODIUM CHLORIDE 0.9 % IV SOLN
2400.0000 mg/m2 | INTRAVENOUS | Status: DC
  Administered 2023-08-12: 6100 mg via INTRAVENOUS
  Filled 2023-08-12: qty 122

## 2023-08-12 MED ORDER — ATROPINE SULFATE 1 MG/ML IV SOLN
0.5000 mg | Freq: Once | INTRAVENOUS | Status: AC | PRN
  Administered 2023-08-12: 0.5 mg via INTRAVENOUS
  Filled 2023-08-12: qty 1

## 2023-08-12 MED ORDER — SODIUM CHLORIDE 0.9 % IV SOLN
400.0000 mg/m2 | Freq: Once | INTRAVENOUS | Status: AC
  Administered 2023-08-12: 1020 mg via INTRAVENOUS
  Filled 2023-08-12: qty 51

## 2023-08-12 MED ORDER — SODIUM CHLORIDE 0.9% FLUSH
10.0000 mL | Freq: Once | INTRAVENOUS | Status: AC
  Administered 2023-08-12: 10 mL

## 2023-08-12 MED ORDER — FLUOROURACIL CHEMO INJECTION 2.5 GM/50ML
400.0000 mg/m2 | Freq: Once | INTRAVENOUS | Status: AC
  Administered 2023-08-12: 1000 mg via INTRAVENOUS
  Filled 2023-08-12: qty 20

## 2023-08-12 MED ORDER — HEPARIN SOD (PORK) LOCK FLUSH 100 UNIT/ML IV SOLN: 500.0000 [IU] | Freq: Once | INTRAVENOUS | Status: DC | PRN

## 2023-08-12 NOTE — Patient Instructions (Signed)
 [facility] - A DEPT OF [facility]  Discharge Instructions: Thank you for choosing [facility] to provide your oncology and hematology care.   If you have a lab appointment with the Cancer Center, please go directly to the Cancer Center and check in at the registration area.   Wear comfortable clothing and clothing appropriate for easy access to any Portacath or PICC line.   We strive to give you quality time with your provider. You may need to reschedule your appointment if you arrive late (15 or more minutes).  Arriving late affects you and other patients whose appointments are after yours.  Also, if you miss three or more appointments without notifying the office, you may be dismissed from the clinic at the provider's discretion.      For prescription refill requests, have your pharmacy contact our office and allow 72 hours for refills to be completed.    Today you received the following chemotherapy and/or immunotherapy agents: Irinotecan , Leucovorin , and Fluorouracil .     To help prevent nausea and vomiting after your treatment, we encourage you to take your nausea medication as directed.  BELOW ARE SYMPTOMS THAT SHOULD BE REPORTED IMMEDIATELY: *FEVER GREATER THAN 100.4 F (38 C) OR HIGHER *CHILLS OR SWEATING *NAUSEA AND VOMITING THAT IS NOT CONTROLLED WITH YOUR NAUSEA MEDICATION *UNUSUAL SHORTNESS OF BREATH *UNUSUAL BRUISING OR BLEEDING *URINARY PROBLEMS (pain or burning when urinating, or frequent urination) *BOWEL PROBLEMS (unusual diarrhea, constipation, pain near the anus) TENDERNESS IN MOUTH AND THROAT WITH OR WITHOUT PRESENCE OF ULCERS (sore throat, sores in mouth, or a toothache) UNUSUAL RASH, SWELLING OR PAIN  UNUSUAL VAGINAL DISCHARGE OR ITCHING   Items with * indicate a potential emergency and should be followed up as soon as possible or go to the Emergency Department if any problems should occur.  Please show the  CHEMOTHERAPY ALERT CARD or IMMUNOTHERAPY ALERT CARD at check-in to the Emergency Department and triage nurse.  Should you have questions after your visit or need to cancel or reschedule your appointment, please contact [facility] - [facility]  Dept: [phone]  and follow the prompts.  Office hours are 8:00 a.m. to 4:30 p.m. Monday - Friday. Please note that voicemails left after 4:00 p.m. may not be returned until the following business day.  We are closed weekends and major holidays. You have access to a nurse at all times for urgent questions. Please call the main number to the clinic Dept: [phone] and follow the prompts.   For any non-urgent questions, you may also contact your provider using MyChart. We now offer e-Visits for anyone 39 and older to request care online for non-urgent symptoms. For details visit [email or web address].   Also download the MyChart app! Go to the app store, search "MyChart", open the app, select [facility], and log in with your MyChart username and password.  The chemotherapy medication bag should finish at 46 hours, 96 hours, or 7 days. For example, if your pump is scheduled for 46 hours and it was put on at 4:00 p.m., it should finish at 2:00 p.m. the day it is scheduled to come off regardless of your appointment time.     Estimated time to finish at:   If the display on your pump reads "Low Volume" and it is beeping, take the batteries out of the pump and come to the cancer center for it  to be taken off.   If the pump alarms go off prior to the pump reading "Low Volume" then call [phone] and someone can assist you.  If the plunger comes out and the chemotherapy medication is leaking out, please use your home chemo spill kit to clean up the spill. Do NOT use paper towels or other household products.  If you have problems or questions regarding your pump, please call either [phone] (24 hours a  day) or the cancer center Monday-Friday 8:00 a.m.- 4:30 p.m. at the clinic number and we will assist you. If you are unable to get assistance, then go to the nearest Emergency Department and ask the staff to contact the IV team for assistance.

## 2023-08-14 ENCOUNTER — Inpatient Hospital Stay

## 2023-08-14 VITALS — BP 120/84 | HR 77 | Temp 99.0°F | Resp 17

## 2023-08-14 DIAGNOSIS — Z95828 Presence of other vascular implants and grafts: Secondary | ICD-10-CM

## 2023-08-14 MED ORDER — HEPARIN SOD (PORK) LOCK FLUSH 100 UNIT/ML IV SOLN: 500.0000 [IU] | Freq: Once | INTRAVENOUS | Status: DC

## 2023-08-14 MED ORDER — SODIUM CHLORIDE 0.9% FLUSH: 10.0000 mL | Freq: Once | INTRAVENOUS | Status: DC

## 2023-08-21 ENCOUNTER — Other Ambulatory Visit: Payer: Self-pay | Admitting: Hematology

## 2023-08-21 DIAGNOSIS — Z7189 Other specified counseling: Secondary | ICD-10-CM

## 2023-08-21 DIAGNOSIS — C7A1 Malignant poorly differentiated neuroendocrine tumors: Secondary | ICD-10-CM

## 2023-08-27 ENCOUNTER — Inpatient Hospital Stay (HOSPITAL_BASED_OUTPATIENT_CLINIC_OR_DEPARTMENT_OTHER): Admitting: Physician Assistant

## 2023-08-27 ENCOUNTER — Inpatient Hospital Stay

## 2023-08-27 VITALS — BP 115/90 | HR 74 | Temp 98.6°F | Resp 16 | Wt 301.8 lb

## 2023-08-27 DIAGNOSIS — Z7189 Other specified counseling: Secondary | ICD-10-CM | POA: Diagnosis not present

## 2023-08-27 DIAGNOSIS — R7989 Other specified abnormal findings of blood chemistry: Secondary | ICD-10-CM | POA: Diagnosis not present

## 2023-08-27 DIAGNOSIS — C7A1 Malignant poorly differentiated neuroendocrine tumors: Secondary | ICD-10-CM

## 2023-08-27 DIAGNOSIS — Z5111 Encounter for antineoplastic chemotherapy: Secondary | ICD-10-CM | POA: Diagnosis not present

## 2023-08-27 DIAGNOSIS — Z95828 Presence of other vascular implants and grafts: Secondary | ICD-10-CM

## 2023-08-27 LAB — CMP (CANCER CENTER ONLY)
ALT: 139 U/L — ABNORMAL HIGH (ref 0–44)
AST: 67 U/L — ABNORMAL HIGH (ref 15–41)
Albumin: 4.1 g/dL (ref 3.5–5.0)
Alkaline Phosphatase: 204 U/L — ABNORMAL HIGH (ref 38–126)
Anion gap: 6 (ref 5–15)
BUN: 10 mg/dL (ref 6–20)
CO2: 28 mmol/L (ref 22–32)
Calcium: 9.2 mg/dL (ref 8.9–10.3)
Chloride: 105 mmol/L (ref 98–111)
Creatinine: 0.79 mg/dL (ref 0.61–1.24)
GFR, Estimated: >60 mL/min (ref >60)
Glucose, Bld: 95 mg/dL (ref 70–99)
Potassium: 3.7 mmol/L (ref 3.5–5.1)
Sodium: 139 mmol/L (ref 135–145)
Total Bilirubin: 1.1 mg/dL (ref 0.0–1.2)
Total Protein: 7.6 g/dL (ref 6.5–8.1)

## 2023-08-27 LAB — CBC WITH DIFFERENTIAL (CANCER CENTER ONLY)
Abs Immature Granulocytes: 0.01 K/uL (ref 0.00–0.07)
Basophils Absolute: 0 K/uL (ref 0.0–0.1)
Basophils Relative: 1 %
Eosinophils Absolute: 0.2 K/uL (ref 0.0–0.5)
Eosinophils Relative: 4 %
HCT: 36 % — ABNORMAL LOW (ref 39.0–52.0)
Hemoglobin: 11.9 g/dL — ABNORMAL LOW (ref 13.0–17.0)
Immature Granulocytes: 0 %
Lymphocytes Relative: 21 %
Lymphs Abs: 0.8 K/uL (ref 0.7–4.0)
MCH: 28.3 pg (ref 26.0–34.0)
MCHC: 33.1 g/dL (ref 30.0–36.0)
MCV: 85.5 fL (ref 80.0–100.0)
Monocytes Absolute: 0.5 K/uL (ref 0.1–1.0)
Monocytes Relative: 13 %
Neutro Abs: 2.3 K/uL (ref 1.7–7.7)
Neutrophils Relative %: 61 %
Platelet Count: 149 K/uL — ABNORMAL LOW (ref 150–400)
RBC: 4.21 MIL/uL — ABNORMAL LOW (ref 4.22–5.81)
RDW: 14.1 % (ref 11.5–15.5)
WBC Count: 3.8 K/uL — ABNORMAL LOW (ref 4.0–10.5)
nRBC: 0 % (ref 0.0–0.2)

## 2023-08-27 MED ORDER — FLUOROURACIL CHEMO INJECTION 2.5 GM/50ML
400.0000 mg/m2 | Freq: Once | INTRAVENOUS | Status: AC
  Administered 2023-08-27: 1000 mg via INTRAVENOUS
  Filled 2023-08-27: qty 20

## 2023-08-27 MED ORDER — PALONOSETRON HCL INJECTION 0.25 MG/5ML
0.2500 mg | Freq: Once | INTRAVENOUS | Status: AC
  Administered 2023-08-27: 0.25 mg via INTRAVENOUS
  Filled 2023-08-27: qty 5

## 2023-08-27 MED ORDER — ATROPINE SULFATE 1 MG/ML IV SOLN
0.5000 mg | Freq: Once | INTRAVENOUS | Status: AC | PRN
  Administered 2023-08-27: 0.5 mg via INTRAVENOUS
  Filled 2023-08-27: qty 1

## 2023-08-27 MED ORDER — DEXAMETHASONE SODIUM PHOSPHATE 10 MG/ML IJ SOLN
10.0000 mg | Freq: Once | INTRAMUSCULAR | Status: AC
  Administered 2023-08-27: 10 mg via INTRAVENOUS
  Filled 2023-08-27: qty 1

## 2023-08-27 MED ORDER — SODIUM CHLORIDE 0.9% FLUSH
10.0000 mL | Freq: Once | INTRAVENOUS | Status: AC
Start: 2023-08-27 — End: 2023-08-27
  Administered 2023-08-27: 10 mL

## 2023-08-27 MED ORDER — SODIUM CHLORIDE 0.9 % IV SOLN
125.0000 mg/m2 | Freq: Once | INTRAVENOUS | Status: AC
  Administered 2023-08-27: 300 mg via INTRAVENOUS
  Filled 2023-08-27: qty 15

## 2023-08-27 MED ORDER — SODIUM CHLORIDE 0.9 % IV SOLN
400.0000 mg/m2 | Freq: Once | INTRAVENOUS | Status: AC
  Administered 2023-08-27: 1020 mg via INTRAVENOUS
  Filled 2023-08-27: qty 51

## 2023-08-27 MED ORDER — SODIUM CHLORIDE 0.9% FLUSH: 10.0000 mL | INTRAVENOUS | Status: DC | PRN

## 2023-08-27 MED ORDER — SODIUM CHLORIDE 0.9 % IV SOLN: Freq: Once | INTRAVENOUS | Status: AC

## 2023-08-27 MED ORDER — SODIUM CHLORIDE 0.9 % IV SOLN
2400.0000 mg/m2 | INTRAVENOUS | Status: DC
  Administered 2023-08-27: 6100 mg via INTRAVENOUS
  Filled 2023-08-27: qty 122

## 2023-08-27 MED ORDER — HEPARIN SOD (PORK) LOCK FLUSH 100 UNIT/ML IV SOLN: 500.0000 [IU] | Freq: Once | INTRAVENOUS | Status: DC | PRN

## 2023-08-27 NOTE — Patient Instructions (Signed)
 [facility] - A DEPT OF [facility]  Discharge Instructions: Thank you for choosing [facility] to provide your oncology and hematology care.   If you have a lab appointment with the Cancer Center, please go directly to the Cancer Center and check in at the registration area.   Wear comfortable clothing and clothing appropriate for easy access to any Portacath or PICC line.   We strive to give you quality time with your provider. You may need to reschedule your appointment if you arrive late (15 or more minutes).  Arriving late affects you and other patients whose appointments are after yours.  Also, if you miss three or more appointments without notifying the office, you may be dismissed from the clinic at the provider's discretion.      For prescription refill requests, have your pharmacy contact our office and allow 72 hours for refills to be completed.    Today you received the following chemotherapy and/or immunotherapy agents: Irinotecan  and Fluorouracil        To help prevent nausea and vomiting after your treatment, we encourage you to take your nausea medication as directed.  BELOW ARE SYMPTOMS THAT SHOULD BE REPORTED IMMEDIATELY: *FEVER GREATER THAN 100.4 F (38 C) OR HIGHER *CHILLS OR SWEATING *NAUSEA AND VOMITING THAT IS NOT CONTROLLED WITH YOUR NAUSEA MEDICATION *UNUSUAL SHORTNESS OF BREATH *UNUSUAL BRUISING OR BLEEDING *URINARY PROBLEMS (pain or burning when urinating, or frequent urination) *BOWEL PROBLEMS (unusual diarrhea, constipation, pain near the anus) TENDERNESS IN MOUTH AND THROAT WITH OR WITHOUT PRESENCE OF ULCERS (sore throat, sores in mouth, or a toothache) UNUSUAL RASH, SWELLING OR PAIN  UNUSUAL VAGINAL DISCHARGE OR ITCHING   Items with * indicate a potential emergency and should be followed up as soon as possible or go to the Emergency Department if any problems should occur.  Please show the CHEMOTHERAPY ALERT  CARD or IMMUNOTHERAPY ALERT CARD at check-in to the Emergency Department and triage nurse.  Should you have questions after your visit or need to cancel or reschedule your appointment, please contact [facility] - [facility] DEPT [facility]  Dept: [phone]  and follow the prompts.  Office hours are 8:00 a.m. to 4:30 p.m. Monday - Friday. Please note that voicemails left after 4:00 p.m. may not be returned until the following business day.  We are closed weekends and major holidays. You have access to a nurse at all times for urgent questions. Please call the main number to the clinic Dept: [phone] and follow the prompts.   For any non-urgent questions, you may also contact your provider using MyChart. We now offer e-Visits for anyone 54 and older to request care online for non-urgent symptoms. For details visit [email or web address].   Also download the MyChart app! Go to the app store, search MyChart, open the app, select , and log in with your MyChart username and password.

## 2023-08-27 NOTE — Progress Notes (Signed)
 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  04/24/2022-07/17/2022: Received 6 cycles of FOLFOX, oxaliplatin  discontinued with 6th cycle due to elevated LFTs.   CURRENT TREATMENT: FOLFIRI, started 07/31/2022  INTERVAL HISTORY:  Leon Fischer. Is a 33 y.o. male returns today for a follow up for high grade neuroendocrine carcinoma prior to Cycle 23, Day 1 of FOLFIRI chemotherapy.He is accompanied by his mother for this visit.   Mr. Dicenzo reports continues to tolerate chemotherapy.  His energy and appetite are stable.  He is able to do all his ADLs on his own.  Denies nausea, vomiting or abdominal pain.  His bowel habits are changed without recurrent episodes of diarrhea or constipation.He denies easy bruising or signs of bleeding. He denies fevers, chills, night sweats, shortness of breath, chest pain or cough. He has no other complaints.  MEDICAL HISTORY:  Past Medical History:  Diagnosis Date   Asthma    Neuroendocrine cancer (HCC)     SURGICAL HISTORY: Past Surgical History:  Procedure Laterality Date   BILIARY DILATION  07/13/2021   Procedure: BILIARY DILATION;  Surgeon: [name], MD;  Location: [name];  Service: Gastroenterology;;   BILIARY STENT PLACEMENT N/A 07/13/2021   Procedure: BILIARY STENT PLACEMENT;  Surgeon: [name], MD;  Location: [name];  Service: Gastroenterology;  Laterality: N/A;   BILIARY STENT PLACEMENT N/A 11/05/2021   Procedure: BILIARY STENT PLACEMENT;  Surgeon: [name], MD;  Location: [name];  Service: Gastroenterology;  Laterality:  N/A;   BIOPSY  07/13/2021   Procedure: BIOPSY;  Surgeon: [name]., MD;  Location: [name];  Service: Gastroenterology;;   CHOLECYSTECTOMY N/A 11/07/2021   Procedure: LAPAROSCOPIC CHOLECYSTECTOMY;  Surgeon: [name], MD;  Location: [facility];  Service: General;  Laterality: N/A;   ENDOSCOPIC RETROGRADE CHOLANGIOPANCREATOGRAPHY (ERCP) WITH PROPOFOL  N/A 07/13/2021   Procedure: ENDOSCOPIC RETROGRADE CHOLANGIOPANCREATOGRAPHY (ERCP) WITH PROPOFOL ;  Surgeon: [name], MD;  [name];  Service: Gastroenterology;  Laterality: N/A;   ERCP N/A 11/05/2021   Procedure: ENDOSCOPIC RETROGRADE CHOLANGIOPANCREATOGRAPHY (ERCP);  Surgeon: [name], MD;  Location: [name];  Service: Gastroenterology;  Laterality: N/A;   ERCP N/A 06/20/2023   Procedure: ERCP, WITH INTERVENTION IF INDICATED;  Surgeon: Saintclair Jasper, MD;  [name];  Service: Gastroenterology;  Laterality: N/A;   ESOPHAGOGASTRODUODENOSCOPY (EGD) WITH PROPOFOL  N/A 07/13/2021   Procedure: ESOPHAGOGASTRODUODENOSCOPY (EGD) WITH PROPOFOL ;  Surgeon: [name]., MD;  [name];  Service: Gastroenterology;  Laterality: N/A;   EUS N/A 07/13/2021   Procedure: UPPER ENDOSCOPIC ULTRASOUND (EUS) LINEAR;  Surgeon: [name]., MD;  [name];  Service: Gastroenterology;  Laterality: N/A;   FINE NEEDLE ASPIRATION  07/13/2021   Procedure: FINE NEEDLE ASPIRATION (FNA) LINEAR;  Surgeon: [name]., MD;  Location: [name];  Service: Gastroenterology;;   IR IMAGING GUIDED PORT INSERTION  07/31/2021   SPHINCTEROTOMY  07/13/2021   Procedure: ANNETT;  Surgeon: [name], MD;  [name];  Service: Gastroenterology;;    SOCIAL HISTORY: Social History   Socioeconomic History   Marital status: Single    Spouse name: Not on file   Number of children: 0   Years of education:  Not on file   Highest education level: Some college, no degree   Occupational History   Not on file  Tobacco Use   Smoking status: Some Days    Types: Cigarettes   Smokeless tobacco: Never  Vaping Use   Vaping status: Never Used  Substance and Sexual Activity   Alcohol use: Yes    Comment: social   Drug use: No   Sexual activity: Never  Other Topics Concern   Not on file  Social History Narrative   Not on file   Social Drivers of Health   Financial Resource Strain: Low Risk  (04/14/2017)   Overall Financial Resource Strain (CARDIA)    Difficulty of Paying Living Expenses: Not hard at all  Food Insecurity: No Food Insecurity (06/18/2023)   Hunger Vital Sign    Worried About Running Out of Food in the Last Year: Never true    Ran Out of Food in the Last Year: Never true  Transportation Needs: No Transportation Needs (06/18/2023)   PRAPARE - Administrator, Civil Service (Medical): No    Lack of Transportation (Non-Medical): No  Physical Activity: Inactive (04/14/2017)   Exercise Vital Sign    Days of Exercise per Week: 0 days    Minutes of Exercise per Session: 0 min  Stress: Stress Concern Present (04/14/2017)   [name] of Occupational Health - Occupational Stress Questionnaire    Feeling of Stress : Rather much  Social Connections: Unknown (06/19/2021)   Received from [facility]   Social Network    Social Network: Not on file  Intimate Partner Violence: Not At Risk (06/18/2023)   Humiliation, Afraid, Rape, and Kick questionnaire    Fear of Current or Ex-Partner: No    Emotionally Abused: No    Physically Abused: No    Sexually Abused: No    FAMILY HISTORY: Family History  Problem Relation Age of Onset   Drug abuse Father    ALLERGIES:  is allergic to shellfish allergy.  MEDICATIONS:  Current Outpatient Medications  Medication Sig Dispense Refill   acetaminophen  (TYLENOL ) 500 MG tablet Take 1,000 mg by mouth every 6 (six) hours as needed for mild pain.     albuterol  (VENTOLIN  HFA) 108 (90 Base) MCG/ACT  inhaler Inhale 1-2 puffs into the lungs every 6 (six) hours as needed for wheezing or shortness of breath. 18 g 0   methocarbamol  (ROBAXIN ) 500 MG tablet Take 1 tablet (500 mg total) by mouth 2 (two) times daily. (Patient taking differently: Take 500 mg by mouth 2 (two) times daily as needed for muscle spasms.) 20 tablet 0   ondansetron  (ZOFRAN ) 8 MG tablet Take 1 tablet (8 mg total) by mouth every 8 (eight) hours as needed for nausea, vomiting or refractory nausea / vomiting. Start on the third day after chemotherapy. 30 tablet 1   No current facility-administered medications for this visit.   Facility-Administered Medications Ordered in Other Visits  Medication Dose Route Frequency Provider Last Rate Last Admin   atropine  injection 0.5 mg  0.5 mg Intravenous Once PRN Kale, Gautam Kishore, MD       fluorouracil  (ADRUCIL ) 6,100 mg in sodium chloride  0.9 % 128 mL chemo infusion  2,400 mg/m2 (Treatment Plan Recorded) Intravenous 1 day or 1 dose Kale, Gautam Kishore, MD       fluorouracil  (ADRUCIL ) chemo injection 1,000 mg  400 mg/m2 (Treatment Plan Recorded) Intravenous Once Kale, Gautam Kishore, MD       heparin  lock flush 100  unit/mL  500 Units Intracatheter Once PRN Kale, Gautam Kishore, MD       irinotecan  (CAMPTOSAR ) 300 mg in sodium chloride  0.9 % 500 mL chemo infusion  125 mg/m2 (Treatment Plan Recorded) Intravenous Once Kale, Gautam Kishore, MD       leucovorin  1,020 mg in sodium chloride  0.9 % 250 mL infusion  400 mg/m2 (Treatment Plan Recorded) Intravenous Once Kale, Gautam Kishore, MD       sodium chloride  flush (NS) 0.9 % injection 10 mL  10 mL Intracatheter PRN [name], MD        REVIEW OF SYSTEMS:    10 Point review of Systems was done is negative except as noted above.  PHYSICAL EXAMINATION: ECOG PERFORMANCE STATUS: 1 - Symptomatic but completely ambulatory Vitals:   08/27/23 1000  BP: (!) 115/90  Pulse: 74  Resp: 16  Temp: 98.6 F (37 C)  SpO2: 98%     GENERAL:alert, in no acute distress and comfortable SKIN: no acute rashes, no significant lesions EYES: conjunctiva are pink and non-injected, sclera anicteric NECK: supple, no JVD LUNGS: clear to auscultation b/l with normal respiratory effort HEART: regular rate & rhythm Extremity: no pedal edema PSYCH: alert & oriented x 3 with fluent speech NEURO: no focal motor/sensory deficits  LABORATORY DATA:  I have reviewed the data as listed  .    Latest Ref Rng & Units 08/27/2023   10:02 AM 08/12/2023   12:17 PM 07/30/2023   10:45 AM  CBC  WBC 4.0 - 10.5 K/uL 3.8  4.0  3.8   Hemoglobin 13.0 - 17.0 g/dL 88.0  88.7  87.9   Hematocrit 39.0 - 52.0 % 36.0  34.0  35.3   Platelets 150 - 400 K/uL 149  146  182     .    Latest Ref Rng & Units 08/27/2023   10:02 AM 08/12/2023   12:17 PM 07/30/2023   10:45 AM  CMP  Glucose 70 - 99 mg/dL 95  887  875   BUN 6 - 20 mg/dL 10  8  10    Creatinine 0.61 - 1.24 mg/dL 9.20  9.24  9.29   Sodium 135 - 145 mmol/L 139  141  140   Potassium 3.5 - 5.1 mmol/L 3.7  3.6  3.7   Chloride 98 - 111 mmol/L 105  108  107   CO2 22 - 32 mmol/L 28  28  28    Calcium  8.9 - 10.3 mg/dL 9.2  8.9  9.2   Total Protein 6.5 - 8.1 g/dL 7.6  6.8  7.6   Total Bilirubin 0.0 - 1.2 mg/dL 1.1  0.7  1.0   Alkaline Phos 38 - 126 U/L 204  118  172   AST 15 - 41 U/L 67  29  35   ALT 0 - 44 U/L 139  22  49    Cytology done 07/13/2021 revealed FINAL MICROSCOPIC DIAGNOSIS:  A. PANCREAS, HEAD, FINE NEEDLE ASPIRATION:  - Malignant cells present  - Poorly differentiated/high-grade neuroendocrine carcinoma (see  comment)   B. CBD STRICTURE, BRUSHING:  - Atypical cells suspicious for tumor   C. BILIARY DILATION, BALLOON, REMOVAL:  - Atypical cells suspicious for tumor   Surgical pathology done 07/13/2021 revealed FINAL MICROSCOPIC DIAGNOSIS:   A. STOMACH, BIOPSY:  Reactive gastropathy and minimal chronic gastritis with lymphoid  aggregate  Negative for H. pylori,  intestinal metaplasia, dysplasia and carcinoma  RADIOGRAPHIC STUDIES: I have personally reviewed the radiological images as listed and  agreed with the findings in the report. No results found.   ASSESSMENT & PLAN:  Leon Fischer. Is a 32 y.o. male who presents for a follow up for high grade neuroendocrine carcinoma.   1. 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 -Cytology done 07/13/2021 shows presence of malignant cells in pancreas and poorly differentiated/high grade neuroendocrine carcinoma. -Received 6 cycles of first line chemotherapy with carboplatin  and etoposide  from 08/01/2021-12/26/2021.Treatment was discontinued after CT CAP from 04/04/2022 showed progression of disease. -Received 6 cycles of second line chemotherapy with FOLFOX from 04/24/2022-07/17/2022. Oxaliplatin  discontinued with 6th cycle due to elevated LFTs. -Started third line chemotherapy with FOLFIRI on 07/31/2022 -Most recent CT CAP from 06/18/2023 showed overall stable disease.  Plan -Due for Cycle 23, Day 1 of FOLFIRI today -Labs from today were reviewed with patient. WBC 3.8, Hgb 11.9, Plt 149K. Creatinine normal. LFTs are increased again wit AST 67, ALT 139, Alk phos 204  -Proceed with treatment today without any dose modifications.  -Next CT CAP will be due in August 2025.  -RTC in 2 weeks for labs, follow up before Cycle 24 Day 1   #Elevated LFTs: --Secondary to biliary stent occlusion s/p ERCP with replacement of CBD stent on 06/20/2023 --LFTs are increased again today with AST 67, ALT 139, Alk phos 204 --Will recheck labs in one week and forward results to Dr. Saintclair (GI)    All of the patient's questions were answered with apparent satisfaction. The patient knows to call the clinic with any problems, questions or concerns.  I have spent a total of 30 minutes minutes of face-to-face and non-face-to-face time, preparing to see the patient, ,  performing a medically appropriate examination, counseling and educating the patient,documenting clinical information in the electronic health record, and care coordination.   Johnston Police PA-C Dept of Hematology and Oncology [facility] Phone: [phone]

## 2023-08-28 ENCOUNTER — Other Ambulatory Visit: Payer: Self-pay

## 2023-08-29 ENCOUNTER — Inpatient Hospital Stay

## 2023-08-29 VITALS — BP 115/78 | HR 77 | Temp 98.3°F | Resp 18

## 2023-08-29 DIAGNOSIS — Z5111 Encounter for antineoplastic chemotherapy: Secondary | ICD-10-CM | POA: Diagnosis not present

## 2023-08-29 DIAGNOSIS — Z95828 Presence of other vascular implants and grafts: Secondary | ICD-10-CM

## 2023-08-29 MED ORDER — HEPARIN SOD (PORK) LOCK FLUSH 100 UNIT/ML IV SOLN
250.0000 [IU] | Freq: Once | INTRAVENOUS | Status: AC
Start: 2023-08-29 — End: 2023-08-29
  Administered 2023-08-29: 250 [IU]

## 2023-08-29 MED ORDER — SODIUM CHLORIDE 0.9% FLUSH
10.0000 mL | Freq: Once | INTRAVENOUS | Status: AC
  Administered 2023-08-29: 10 mL

## 2023-09-02 ENCOUNTER — Inpatient Hospital Stay

## 2023-09-02 DIAGNOSIS — Z95828 Presence of other vascular implants and grafts: Secondary | ICD-10-CM

## 2023-09-02 DIAGNOSIS — R7989 Other specified abnormal findings of blood chemistry: Secondary | ICD-10-CM

## 2023-09-02 DIAGNOSIS — Z5111 Encounter for antineoplastic chemotherapy: Secondary | ICD-10-CM | POA: Diagnosis not present

## 2023-09-02 LAB — CMP (CANCER CENTER ONLY)
ALT: 38 U/L (ref 0–44)
AST: 20 U/L (ref 15–41)
Albumin: 4 g/dL (ref 3.5–5.0)
Alkaline Phosphatase: 133 U/L — ABNORMAL HIGH (ref 38–126)
Anion gap: 6 (ref 5–15)
BUN: 11 mg/dL (ref 6–20)
CO2: 26 mmol/L (ref 22–32)
Calcium: 8.8 mg/dL — ABNORMAL LOW (ref 8.9–10.3)
Chloride: 105 mmol/L (ref 98–111)
Creatinine: 0.85 mg/dL (ref 0.61–1.24)
GFR, Estimated: >60 mL/min (ref >60)
Glucose, Bld: 123 mg/dL — ABNORMAL HIGH (ref 70–99)
Potassium: 3.4 mmol/L — ABNORMAL LOW (ref 3.5–5.1)
Sodium: 137 mmol/L (ref 135–145)
Total Bilirubin: 0.8 mg/dL (ref 0.0–1.2)
Total Protein: 7.3 g/dL (ref 6.5–8.1)

## 2023-09-02 MED ORDER — SODIUM CHLORIDE 0.9% FLUSH
10.0000 mL | Freq: Once | INTRAVENOUS | Status: AC
  Administered 2023-09-02: 10 mL

## 2023-09-02 MED ORDER — HEPARIN SOD (PORK) LOCK FLUSH 100 UNIT/ML IV SOLN
500.0000 [IU] | Freq: Once | INTRAVENOUS | Status: AC
  Administered 2023-09-02: 500 [IU]

## 2023-09-04 NOTE — Progress Notes (Signed)
 Attempted to contact pt. Left pt a message stating that his LFTs are WNL.

## 2023-09-09 ENCOUNTER — Inpatient Hospital Stay

## 2023-09-09 ENCOUNTER — Inpatient Hospital Stay: Attending: Hematology

## 2023-09-09 ENCOUNTER — Inpatient Hospital Stay (HOSPITAL_BASED_OUTPATIENT_CLINIC_OR_DEPARTMENT_OTHER): Admitting: Hematology

## 2023-09-09 VITALS — BP 135/85 | HR 76 | Temp 97.8°F | Resp 18 | Wt 302.5 lb

## 2023-09-09 DIAGNOSIS — Z79634 Long term (current) use of topoisomerase inhibitor: Secondary | ICD-10-CM | POA: Insufficient documentation

## 2023-09-09 DIAGNOSIS — Z79631 Long term (current) use of antimetabolite agent: Secondary | ICD-10-CM | POA: Diagnosis not present

## 2023-09-09 DIAGNOSIS — F17210 Nicotine dependence, cigarettes, uncomplicated: Secondary | ICD-10-CM | POA: Insufficient documentation

## 2023-09-09 DIAGNOSIS — Z7189 Other specified counseling: Secondary | ICD-10-CM

## 2023-09-09 DIAGNOSIS — C7B8 Other secondary neuroendocrine tumors: Secondary | ICD-10-CM | POA: Diagnosis not present

## 2023-09-09 DIAGNOSIS — C7A1 Malignant poorly differentiated neuroendocrine tumors: Secondary | ICD-10-CM

## 2023-09-09 DIAGNOSIS — Z95828 Presence of other vascular implants and grafts: Secondary | ICD-10-CM

## 2023-09-09 DIAGNOSIS — Z5111 Encounter for antineoplastic chemotherapy: Secondary | ICD-10-CM

## 2023-09-09 LAB — CMP (CANCER CENTER ONLY)
ALT: 26 U/L (ref 0–44)
AST: 22 U/L (ref 15–41)
Albumin: 4.1 g/dL (ref 3.5–5.0)
Alkaline Phosphatase: 122 U/L (ref 38–126)
Anion gap: 5 (ref 5–15)
BUN: 10 mg/dL (ref 6–20)
CO2: 27 mmol/L (ref 22–32)
Calcium: 9.2 mg/dL (ref 8.9–10.3)
Chloride: 108 mmol/L (ref 98–111)
Creatinine: 0.79 mg/dL (ref 0.61–1.24)
GFR, Estimated: >60 mL/min (ref >60)
Glucose, Bld: 96 mg/dL (ref 70–99)
Potassium: 3.7 mmol/L (ref 3.5–5.1)
Sodium: 140 mmol/L (ref 135–145)
Total Bilirubin: 0.7 mg/dL (ref 0.0–1.2)
Total Protein: 7.4 g/dL (ref 6.5–8.1)

## 2023-09-09 LAB — CBC WITH DIFFERENTIAL (CANCER CENTER ONLY)
Abs Immature Granulocytes: 0.01 K/uL (ref 0.00–0.07)
Basophils Absolute: 0 K/uL (ref 0.0–0.1)
Basophils Relative: 0 %
Eosinophils Absolute: 0.2 K/uL (ref 0.0–0.5)
Eosinophils Relative: 6 %
HCT: 34 % — ABNORMAL LOW (ref 39.0–52.0)
Hemoglobin: 11.4 g/dL — ABNORMAL LOW (ref 13.0–17.0)
Immature Granulocytes: 0 %
Lymphocytes Relative: 18 %
Lymphs Abs: 0.6 K/uL — ABNORMAL LOW (ref 0.7–4.0)
MCH: 28.6 pg (ref 26.0–34.0)
MCHC: 33.5 g/dL (ref 30.0–36.0)
MCV: 85.4 fL (ref 80.0–100.0)
Monocytes Absolute: 0.4 K/uL (ref 0.1–1.0)
Monocytes Relative: 12 %
Neutro Abs: 2.2 K/uL (ref 1.7–7.7)
Neutrophils Relative %: 64 %
Platelet Count: 134 K/uL — ABNORMAL LOW (ref 150–400)
RBC: 3.98 MIL/uL — ABNORMAL LOW (ref 4.22–5.81)
RDW: 14.4 % (ref 11.5–15.5)
WBC Count: 3.4 K/uL — ABNORMAL LOW (ref 4.0–10.5)
nRBC: 0 % (ref 0.0–0.2)

## 2023-09-09 MED ORDER — SODIUM CHLORIDE 0.9 % IV SOLN
400.0000 mg/m2 | Freq: Once | INTRAVENOUS | Status: AC
  Administered 2023-09-09: 1020 mg via INTRAVENOUS
  Filled 2023-09-09: qty 50

## 2023-09-09 MED ORDER — ATROPINE SULFATE 1 MG/ML IV SOLN
0.5000 mg | Freq: Once | INTRAVENOUS | Status: AC | PRN
  Administered 2023-09-09: 0.5 mg via INTRAVENOUS
  Filled 2023-09-09: qty 1

## 2023-09-09 MED ORDER — SODIUM CHLORIDE 0.9 % IV SOLN
Freq: Once | INTRAVENOUS | Status: AC
Start: 2023-09-09 — End: 2023-09-09

## 2023-09-09 MED ORDER — SODIUM CHLORIDE 0.9 % IV SOLN
2400.0000 mg/m2 | INTRAVENOUS | Status: DC
  Administered 2023-09-09: 6100 mg via INTRAVENOUS
  Filled 2023-09-09: qty 122

## 2023-09-09 MED ORDER — DEXAMETHASONE SODIUM PHOSPHATE 10 MG/ML IJ SOLN
10.0000 mg | Freq: Once | INTRAMUSCULAR | Status: AC
  Administered 2023-09-09: 10 mg via INTRAVENOUS
  Filled 2023-09-09: qty 1

## 2023-09-09 MED ORDER — FLUOROURACIL CHEMO INJECTION 2.5 GM/50ML
400.0000 mg/m2 | Freq: Once | INTRAVENOUS | Status: AC
  Administered 2023-09-09: 1000 mg via INTRAVENOUS
  Filled 2023-09-09: qty 20

## 2023-09-09 MED ORDER — PALONOSETRON HCL INJECTION 0.25 MG/5ML
0.2500 mg | Freq: Once | INTRAVENOUS | Status: AC
  Administered 2023-09-09: 0.25 mg via INTRAVENOUS
  Filled 2023-09-09: qty 5

## 2023-09-09 MED ORDER — SODIUM CHLORIDE 0.9 % IV SOLN
125.0000 mg/m2 | Freq: Once | INTRAVENOUS | Status: AC
  Administered 2023-09-09: 300 mg via INTRAVENOUS
  Filled 2023-09-09: qty 15

## 2023-09-09 MED ORDER — SODIUM CHLORIDE 0.9% FLUSH
10.0000 mL | Freq: Once | INTRAVENOUS | Status: AC
Start: 2023-09-09 — End: 2023-09-09
  Administered 2023-09-09: 10 mL

## 2023-09-09 NOTE — Progress Notes (Signed)
 HEMATOLOGY/ONCOLOGY CLINIC NOTE  Date of Service: 09/09/23  Patient Care Team: Patient, No Pcp Per as PCP - General (General Practice) [name], MD as Consulting Physician (Oncology)  CHIEF COMPLAINTS/PURPOSE OF CONSULTATION:  Continued evaluation and management of poorly differentiated/high grade metastatic neuroendocrine carcinoma   PRIOR TREATMENT: 08/01/2021-12/26/2021: Received 6 cycles of Carboplatin  and Etoposide , d/c due to progression of disease.  04/24/2022-07/17/2022: Received 6 cycles of FOLFOX, oxaliplatin  discontinued with 6th cycle due to elevated LFTs.   CURRENT TREATMENT: FOLFIRI, started 07/31/2022  INTERVAL HISTORY:  Leon Fischer. Is a 33 y.o. male returns today for a follow up for high grade neuroendocrine carcinoma prior to Cycle 24, Day 1 of FOLFIRI chemotherapy.He is accompanied by his mother for this visit.   Mr. Martine notes no acute new symptoms since his last clinic visit.  He continues to tolerate his FOLFIRI fairly well with some mild intermittent liver enzyme elevations. No shortness of breath or chest pain.  No abdominal pain or distention.  No change in bowel habits. No new tingling or numbness in his hands or feet. Good p.o. intake.  Energy levels are been reasonable. He has been working in his week off and appears to like this since he gets him out of the house and he is feeling less bored as a result.   MEDICAL HISTORY:  Past Medical History:  Diagnosis Date   Asthma    Neuroendocrine cancer (HCC)     SURGICAL HISTORY: Past Surgical History:  Procedure Laterality Date   BILIARY DILATION  07/13/2021   Procedure: BILIARY DILATION;  Surgeon: [name], MD;  Location: [name];  Service: Gastroenterology;;   BILIARY STENT PLACEMENT N/A 07/13/2021   Procedure: BILIARY STENT PLACEMENT;  Surgeon: [name], MD;  [name];  Service: Gastroenterology;  Laterality: N/A;   BILIARY STENT PLACEMENT N/A  11/05/2021   Procedure: BILIARY STENT PLACEMENT;  Surgeon: [name], MD;  [name];  Service: Gastroenterology;  Laterality: N/A;   BIOPSY  07/13/2021   Procedure: BIOPSY;  Surgeon: [name]., MD;  Location: [name];  Service: Gastroenterology;;   CHOLECYSTECTOMY N/A 11/07/2021   Procedure: LAPAROSCOPIC CHOLECYSTECTOMY;  Surgeon: [name], MD;  Location: [facility];  Service: General;  Laterality: N/A;   ENDOSCOPIC RETROGRADE CHOLANGIOPANCREATOGRAPHY (ERCP) WITH PROPOFOL  N/A 07/13/2021   Procedure: ENDOSCOPIC RETROGRADE CHOLANGIOPANCREATOGRAPHY (ERCP) WITH PROPOFOL ;  Surgeon: [name], MD;  [name];  Service: Gastroenterology;  Laterality: N/A;   ERCP N/A 11/05/2021   Procedure: ENDOSCOPIC RETROGRADE CHOLANGIOPANCREATOGRAPHY (ERCP);  Surgeon: [name], MD;  Location: [name];  Service: Gastroenterology;  Laterality: N/A;   ERCP N/A 06/20/2023   Procedure: ERCP, WITH INTERVENTION IF INDICATED;  Surgeon: Saintclair Jasper, MD;  [name];  Service: Gastroenterology;  Laterality: N/A;   ESOPHAGOGASTRODUODENOSCOPY (EGD) WITH PROPOFOL  N/A 07/13/2021   Procedure: ESOPHAGOGASTRODUODENOSCOPY (EGD) WITH PROPOFOL ;  Surgeon: [name]., MD;  [name];  Service: Gastroenterology;  Laterality: N/A;   EUS N/A 07/13/2021   Procedure: UPPER ENDOSCOPIC ULTRASOUND (EUS) LINEAR;  Surgeon: [name]., MD;  Location[name];  Service: Gastroenterology;  Laterality: N/A;   FINE NEEDLE ASPIRATION  07/13/2021   Procedure: FINE NEEDLE ASPIRATION (FNA) LINEAR;  Surgeon: [name]., MD;  Location: [name];  Service: Gastroenterology;;   IR IMAGING GUIDED PORT INSERTION  07/31/2021   SPHINCTEROTOMY  07/13/2021   Procedure: ANNETT;  Surgeon: [name], MD;  [facility]: [name];  Service: Gastroenterology;;  SOCIAL HISTORY: Social History   Socioeconomic History   Marital status:  Single    Spouse name: Not on file   Number of children: 0   Years of education: Not on file   Highest education level: Some college, no degree  Occupational History   Not on file  Tobacco Use   Smoking status: Some Days    Types: Cigarettes   Smokeless tobacco: Never  Vaping Use   Vaping status: Never Used  Substance and Sexual Activity   Alcohol use: Yes    Comment: social   Drug use: No   Sexual activity: Never  Other Topics Concern   Not on file  Social History Narrative   Not on file   Social Drivers of Health   Financial Resource Strain: Low Risk  (04/14/2017)   Overall Financial Resource Strain (CARDIA)    Difficulty of Paying Living Expenses: Not hard at all  Food Insecurity: No Food Insecurity (06/18/2023)   Hunger Vital Sign    Worried About Running Out of Food in the Last Year: Never true    Ran Out of Food in the Last Year: Never true  Transportation Needs: No Transportation Needs (06/18/2023)   PRAPARE - Administrator, Civil Service (Medical): No    Lack of Transportation (Non-Medical): No  Physical Activity: Inactive (04/14/2017)   Exercise Vital Sign    Days of Exercise per Week: 0 days    Minutes of Exercise per Session: 0 min  Stress: Stress Concern Present (04/14/2017)   [name] of Occupational Health - Occupational Stress Questionnaire    Feeling of Stress : Rather much  Social Connections: Unknown (06/19/2021)   Received from [facility]   Social Network    Social Network: Not on file  Intimate Partner Violence: Not At Risk (06/18/2023)   Humiliation, Afraid, Rape, and Kick questionnaire    Fear of Current or Ex-Partner: No    Emotionally Abused: No    Physically Abused: No    Sexually Abused: No    FAMILY HISTORY: Family History  Problem Relation Age of Onset   Drug abuse Father    ALLERGIES:  is allergic to shellfish allergy.  MEDICATIONS:  Current Outpatient Medications  Medication Sig Dispense Refill    acetaminophen  (TYLENOL ) 500 MG tablet Take 1,000 mg by mouth every 6 (six) hours as needed for mild pain.     albuterol  (VENTOLIN  HFA) 108 (90 Base) MCG/ACT inhaler Inhale 1-2 puffs into the lungs every 6 (six) hours as needed for wheezing or shortness of breath. 18 g 0   methocarbamol  (ROBAXIN ) 500 MG tablet Take 1 tablet (500 mg total) by mouth 2 (two) times daily. (Patient taking differently: Take 500 mg by mouth 2 (two) times daily as needed for muscle spasms.) 20 tablet 0   ondansetron  (ZOFRAN ) 8 MG tablet Take 1 tablet (8 mg total) by mouth every 8 (eight) hours as needed for nausea, vomiting or refractory nausea / vomiting. Start on the third day after chemotherapy. 30 tablet 1   No current facility-administered medications for this visit.    REVIEW OF SYSTEMS:    .10 Point review of Systems was done is negative except as noted above.  PHYSICAL EXAMINATION: ECOG PERFORMANCE STATUS: 1 - Symptomatic but completely ambulatory Vitals:   09/09/23 1213  BP: 135/85  Pulse: 76  Resp: 18  Temp: 97.8 F (36.6 C)  SpO2: 98%   GENERAL:alert, in no acute distress and comfortable SKIN: no acute rashes, no  significant lesions EYES: conjunctiva are pink and non-injected, sclera anicteric NECK: supple, no JVD LUNGS: clear to auscultation b/l with normal respiratory effort HEART: regular rate & rhythm Extremity: no pedal edema PSYCH: alert & oriented x 3 with fluent speech NEURO: no focal motor/sensory deficits  LABORATORY DATA:  I have reviewed the data as listed  .    Latest Ref Rng & Units 09/09/2023   11:43 AM 08/27/2023   10:02 AM 08/12/2023   12:17 PM  CBC  WBC 4.0 - 10.5 K/uL 3.4  3.8  4.0   Hemoglobin 13.0 - 17.0 g/dL 88.5  88.0  88.7   Hematocrit 39.0 - 52.0 % 34.0  36.0  34.0   Platelets 150 - 400 K/uL 134  149  146     .    Latest Ref Rng & Units 09/09/2023   11:43 AM 09/02/2023   12:25 PM 08/27/2023   10:02 AM  CMP  Glucose 70 - 99 mg/dL 96  876  95   BUN 6 - 20 mg/dL  10  11  10    Creatinine 0.61 - 1.24 mg/dL 9.20  9.14  9.20   Sodium 135 - 145 mmol/L 140  137  139   Potassium 3.5 - 5.1 mmol/L 3.7  3.4  3.7   Chloride 98 - 111 mmol/L 108  105  105   CO2 22 - 32 mmol/L 27  26  28    Calcium  8.9 - 10.3 mg/dL 9.2  8.8  9.2   Total Protein 6.5 - 8.1 g/dL 7.4  7.3  7.6   Total Bilirubin 0.0 - 1.2 mg/dL 0.7  0.8  1.1   Alkaline Phos 38 - 126 U/L 122  133  204   AST 15 - 41 U/L 22  20  67   ALT 0 - 44 U/L 26  38  139    Cytology done 07/13/2021 revealed FINAL MICROSCOPIC DIAGNOSIS:  A. PANCREAS, HEAD, FINE NEEDLE ASPIRATION:  - Malignant cells present  - Poorly differentiated/high-grade neuroendocrine carcinoma (see  comment)   B. CBD STRICTURE, BRUSHING:  - Atypical cells suspicious for tumor   C. BILIARY DILATION, BALLOON, REMOVAL:  - Atypical cells suspicious for tumor   Surgical pathology done 07/13/2021 revealed FINAL MICROSCOPIC DIAGNOSIS:   A. STOMACH, BIOPSY:  Reactive gastropathy and minimal chronic gastritis with lymphoid  aggregate  Negative for H. pylori, intestinal metaplasia, dysplasia and carcinoma  RADIOGRAPHIC STUDIES: I have personally reviewed the radiological images as listed and agreed with the findings in the report. No results found.   ASSESSMENT & PLAN:  Leon Fischer. Is a 33 y.o. male who presents for a follow up for high grade neuroendocrine carcinoma.   1. 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 -Cytology done 07/13/2021 shows presence of malignant cells in pancreas and poorly differentiated/high grade neuroendocrine carcinoma. -Received 6 cycles of first line chemotherapy with carboplatin  and etoposide  from 08/01/2021-12/26/2021.Treatment was discontinued after CT CAP from 04/04/2022 showed progression of disease. -Received 6 cycles of second line chemotherapy with FOLFOX from 04/24/2022-07/17/2022. Oxaliplatin  discontinued with 6th cycle due to  elevated LFTs. -Started third line chemotherapy with FOLFIRI on 07/31/2022 -Most recent CT CAP from 06/18/2023 showed overall stable disease.  Plan -Due for Cycle 24, Day 1 of FOLFIRI today -Labs from today were reviewed with patient.  - CBC stable with a hemoglobin of 11.4 WBC count of 3.4k with ANC of 2.2k and platelets of 134k - CMP stable and  WNL.  LFTs have normalized -Patient appropriate to proceed with FOLFIRI today without any additional dose changes with the same supportive medications. -Next CT CAP will be due in 3rd or 4th week of August 2025.   #Elevated LFTs: --Secondary to biliary stent occlusion s/p ERCP with replacement of CBD stent on 06/20/2023 -Fluctuating LFTs with chemotherapy. - Dr. Saintclair is his gastroenterologist   The total time spent in the appointment was 30 minutes*.  All of the patient's questions were answered with apparent satisfaction. The patient knows to call the clinic with any problems, questions or concerns.   Emaline Saran MD MS AAHIVMS [facility] Hematology/Oncology Physician [facility]  .*Total Encounter Time as defined by the Centers for Medicare and Medicaid Services includes, in addition to the face-to-face time of a patient visit (documented in the note above) non-face-to-face time: obtaining and reviewing outside history, ordering and reviewing medications, tests or procedures, care coordination (communications with other health care professionals or caregivers) and documentation in the medical record.

## 2023-09-09 NOTE — Patient Instructions (Signed)
 [facility] - A DEPT OF [facility]  Discharge Instructions: Thank you for choosing [facility] to provide your oncology and hematology care.   If you have a lab appointment with the Cancer Center, please go directly to the Cancer Center and check in at the registration area.   Wear comfortable clothing and clothing appropriate for easy access to any Portacath or PICC line.   We strive to give you quality time with your provider. You may need to reschedule your appointment if you arrive late (15 or more minutes).  Arriving late affects you and other patients whose appointments are after yours.  Also, if you miss three or more appointments without notifying the office, you may be dismissed from the clinic at the provider's discretion.      For prescription refill requests, have your pharmacy contact our office and allow 72 hours for refills to be completed.    Today you received the following chemotherapy and/or immunotherapy agents: Irinotecan, Leucovorin, 5FU      To help prevent nausea and vomiting after your treatment, we encourage you to take your nausea medication as directed.  BELOW ARE SYMPTOMS THAT SHOULD BE REPORTED IMMEDIATELY: *FEVER GREATER THAN 100.4 F (38 C) OR HIGHER *CHILLS OR SWEATING *NAUSEA AND VOMITING THAT IS NOT CONTROLLED WITH YOUR NAUSEA MEDICATION *UNUSUAL SHORTNESS OF BREATH *UNUSUAL BRUISING OR BLEEDING *URINARY PROBLEMS (pain or burning when urinating, or frequent urination) *BOWEL PROBLEMS (unusual diarrhea, constipation, pain near the anus) TENDERNESS IN MOUTH AND THROAT WITH OR WITHOUT PRESENCE OF ULCERS (sore throat, sores in mouth, or a toothache) UNUSUAL RASH, SWELLING OR PAIN  UNUSUAL VAGINAL DISCHARGE OR ITCHING   Items with * indicate a potential emergency and should be followed up as soon as possible or go to the Emergency Department if any problems should occur.  Please show the CHEMOTHERAPY ALERT CARD  or IMMUNOTHERAPY ALERT CARD at check-in to the Emergency Department and triage nurse.  Should you have questions after your visit or need to cancel or reschedule your appointment, please contact [facility] - A DEPT OF [facility]  Dept: [phone]  and follow the prompts.  Office hours are 8:00 a.m. to 4:30 p.m. Monday - Friday. Please note that voicemails left after 4:00 p.m. may not be returned until the following business day.  We are closed weekends and major holidays. You have access to a nurse at all times for urgent questions. Please call the main number to the clinic Dept: [phone] and follow the prompts.   For any non-urgent questions, you may also contact your provider using MyChart. We now offer e-Visits for anyone 50 and older to request care online for non-urgent symptoms. For details visit [email or web address].   Also download the MyChart app! Go to the app store, search "MyChart", open the app, select [facility], and log in with your MyChart username and password.

## 2023-09-11 ENCOUNTER — Inpatient Hospital Stay

## 2023-09-11 VITALS — BP 122/83 | HR 79 | Temp 98.4°F | Resp 17

## 2023-09-11 DIAGNOSIS — Z95828 Presence of other vascular implants and grafts: Secondary | ICD-10-CM

## 2023-09-11 MED ORDER — SODIUM CHLORIDE 0.9% FLUSH
10.0000 mL | Freq: Once | INTRAVENOUS | Status: AC
Start: 2023-09-11 — End: 2023-09-11
  Administered 2023-09-11: 10 mL

## 2023-09-19 ENCOUNTER — Other Ambulatory Visit: Payer: Self-pay

## 2023-09-23 ENCOUNTER — Other Ambulatory Visit

## 2023-09-23 ENCOUNTER — Inpatient Hospital Stay

## 2023-09-23 ENCOUNTER — Ambulatory Visit: Admitting: Hematology

## 2023-09-23 VITALS — BP 129/80 | HR 76 | Temp 98.0°F | Resp 16 | Ht 72.0 in | Wt 310.0 lb

## 2023-09-23 DIAGNOSIS — Z7189 Other specified counseling: Secondary | ICD-10-CM

## 2023-09-23 DIAGNOSIS — Z5111 Encounter for antineoplastic chemotherapy: Secondary | ICD-10-CM | POA: Diagnosis not present

## 2023-09-23 DIAGNOSIS — C7A1 Malignant poorly differentiated neuroendocrine tumors: Secondary | ICD-10-CM

## 2023-09-23 DIAGNOSIS — Z95828 Presence of other vascular implants and grafts: Secondary | ICD-10-CM

## 2023-09-23 LAB — CBC WITH DIFFERENTIAL (CANCER CENTER ONLY)
Abs Immature Granulocytes: 0 K/uL (ref 0.00–0.07)
Basophils Absolute: 0 K/uL (ref 0.0–0.1)
Basophils Relative: 0 %
Eosinophils Absolute: 0.1 K/uL (ref 0.0–0.5)
Eosinophils Relative: 5 %
HCT: 31.4 % — ABNORMAL LOW (ref 39.0–52.0)
Hemoglobin: 10.2 g/dL — ABNORMAL LOW (ref 13.0–17.0)
Immature Granulocytes: 0 %
Lymphocytes Relative: 17 %
Lymphs Abs: 0.4 K/uL — ABNORMAL LOW (ref 0.7–4.0)
MCH: 28 pg (ref 26.0–34.0)
MCHC: 32.5 g/dL (ref 30.0–36.0)
MCV: 86.3 fL (ref 80.0–100.0)
Monocytes Absolute: 0.3 K/uL (ref 0.1–1.0)
Monocytes Relative: 13 %
Neutro Abs: 1.6 K/uL — ABNORMAL LOW (ref 1.7–7.7)
Neutrophils Relative %: 65 %
Platelet Count: 125 K/uL — ABNORMAL LOW (ref 150–400)
RBC: 3.64 MIL/uL — ABNORMAL LOW (ref 4.22–5.81)
RDW: 14.7 % (ref 11.5–15.5)
WBC Count: 2.5 K/uL — ABNORMAL LOW (ref 4.0–10.5)
nRBC: 0 % (ref 0.0–0.2)

## 2023-09-23 LAB — CMP (CANCER CENTER ONLY)
ALT: 27 U/L (ref 0–44)
AST: 22 U/L (ref 15–41)
Albumin: 3.9 g/dL (ref 3.5–5.0)
Alkaline Phosphatase: 99 U/L (ref 38–126)
Anion gap: 5 (ref 5–15)
BUN: 7 mg/dL (ref 6–20)
CO2: 26 mmol/L (ref 22–32)
Calcium: 8.6 mg/dL — ABNORMAL LOW (ref 8.9–10.3)
Chloride: 110 mmol/L (ref 98–111)
Creatinine: 0.73 mg/dL (ref 0.61–1.24)
GFR, Estimated: >60 mL/min (ref >60)
Glucose, Bld: 132 mg/dL — ABNORMAL HIGH (ref 70–99)
Potassium: 3.6 mmol/L (ref 3.5–5.1)
Sodium: 141 mmol/L (ref 135–145)
Total Bilirubin: 0.5 mg/dL (ref 0.0–1.2)
Total Protein: 6.7 g/dL (ref 6.5–8.1)

## 2023-09-23 MED ORDER — SODIUM CHLORIDE 0.9% FLUSH
10.0000 mL | Freq: Once | INTRAVENOUS | Status: AC
Start: 2023-09-23 — End: 2023-09-23
  Administered 2023-09-23: 10 mL

## 2023-09-23 MED ORDER — SODIUM CHLORIDE 0.9 % IV SOLN: Freq: Once | INTRAVENOUS | Status: AC

## 2023-09-23 MED ORDER — DEXAMETHASONE SODIUM PHOSPHATE 10 MG/ML IJ SOLN
10.0000 mg | Freq: Once | INTRAMUSCULAR | Status: AC
  Administered 2023-09-23: 10 mg via INTRAVENOUS
  Filled 2023-09-23: qty 1

## 2023-09-23 MED ORDER — SODIUM CHLORIDE 0.9 % IV SOLN
125.0000 mg/m2 | Freq: Once | INTRAVENOUS | Status: AC
  Administered 2023-09-23: 300 mg via INTRAVENOUS
  Filled 2023-09-23: qty 15

## 2023-09-23 MED ORDER — ATROPINE SULFATE 1 MG/ML IV SOLN
0.5000 mg | Freq: Once | INTRAVENOUS | Status: AC | PRN
  Administered 2023-09-23: 0.5 mg via INTRAVENOUS
  Filled 2023-09-23: qty 1

## 2023-09-23 MED ORDER — SODIUM CHLORIDE 0.9 % IV SOLN
2400.0000 mg/m2 | INTRAVENOUS | Status: DC
  Administered 2023-09-23: 6100 mg via INTRAVENOUS
  Filled 2023-09-23: qty 122

## 2023-09-23 MED ORDER — FLUOROURACIL CHEMO INJECTION 2.5 GM/50ML
400.0000 mg/m2 | Freq: Once | INTRAVENOUS | Status: AC
  Administered 2023-09-23: 1000 mg via INTRAVENOUS
  Filled 2023-09-23: qty 20

## 2023-09-23 MED ORDER — PALONOSETRON HCL INJECTION 0.25 MG/5ML
0.2500 mg | Freq: Once | INTRAVENOUS | Status: AC
  Administered 2023-09-23: 0.25 mg via INTRAVENOUS
  Filled 2023-09-23: qty 5

## 2023-09-23 MED ORDER — SODIUM CHLORIDE 0.9 % IV SOLN
400.0000 mg/m2 | Freq: Once | INTRAVENOUS | Status: AC
  Administered 2023-09-23: 1020 mg via INTRAVENOUS
  Filled 2023-09-23: qty 51

## 2023-09-23 NOTE — Patient Instructions (Signed)
 [facility] - A DEPT OF [facility]  Discharge Instructions: Thank you for choosing [facility] to provide your oncology and hematology care.   If you have a lab appointment with the Cancer Center, please go directly to the Cancer Center and check in at the registration area.   Wear comfortable clothing and clothing appropriate for easy access to any Portacath or PICC line.   We strive to give you quality time with your provider. You may need to reschedule your appointment if you arrive late (15 or more minutes).  Arriving late affects you and other patients whose appointments are after yours.  Also, if you miss three or more appointments without notifying the office, you may be dismissed from the clinic at the provider's discretion.      For prescription refill requests, have your pharmacy contact our office and allow 72 hours for refills to be completed.    Today you received the following chemotherapy and/or immunotherapy agents: Irinotecan , Leucovorin , and Fluorouracil .     To help prevent nausea and vomiting after your treatment, we encourage you to take your nausea medication as directed.  BELOW ARE SYMPTOMS THAT SHOULD BE REPORTED IMMEDIATELY: *FEVER GREATER THAN 100.4 F (38 C) OR HIGHER *CHILLS OR SWEATING *NAUSEA AND VOMITING THAT IS NOT CONTROLLED WITH YOUR NAUSEA MEDICATION *UNUSUAL SHORTNESS OF BREATH *UNUSUAL BRUISING OR BLEEDING *URINARY PROBLEMS (pain or burning when urinating, or frequent urination) *BOWEL PROBLEMS (unusual diarrhea, constipation, pain near the anus) TENDERNESS IN MOUTH AND THROAT WITH OR WITHOUT PRESENCE OF ULCERS (sore throat, sores in mouth, or a toothache) UNUSUAL RASH, SWELLING OR PAIN  UNUSUAL VAGINAL DISCHARGE OR ITCHING   Items with * indicate a potential emergency and should be followed up as soon as possible or go to the Emergency Department if any problems should occur.  Please show the  CHEMOTHERAPY ALERT CARD or IMMUNOTHERAPY ALERT CARD at check-in to the Emergency Department and triage nurse.  Should you have questions after your visit or need to cancel or reschedule your appointment, please contact [facility] - [facility]  Dept: [phone]  and follow the prompts.  Office hours are 8:00 a.m. to 4:30 p.m. Monday - Friday. Please note that voicemails left after 4:00 p.m. may not be returned until the following business day.  We are closed weekends and major holidays. You have access to a nurse at all times for urgent questions. Please call the main number to the clinic Dept: [phone] and follow the prompts.   For any non-urgent questions, you may also contact your provider using MyChart. We now offer e-Visits for anyone 39 and older to request care online for non-urgent symptoms. For details visit [email or web address].   Also download the MyChart app! Go to the app store, search "MyChart", open the app, select [facility], and log in with your MyChart username and password.  The chemotherapy medication bag should finish at 46 hours, 96 hours, or 7 days. For example, if your pump is scheduled for 46 hours and it was put on at 4:00 p.m., it should finish at 2:00 p.m. the day it is scheduled to come off regardless of your appointment time.     Estimated time to finish at:   If the display on your pump reads "Low Volume" and it is beeping, take the batteries out of the pump and come to the cancer center for it  to be taken off.   If the pump alarms go off prior to the pump reading "Low Volume" then call [phone] and someone can assist you.  If the plunger comes out and the chemotherapy medication is leaking out, please use your home chemo spill kit to clean up the spill. Do NOT use paper towels or other household products.  If you have problems or questions regarding your pump, please call either [phone] (24 hours a  day) or the cancer center Monday-Friday 8:00 a.m.- 4:30 p.m. at the clinic number and we will assist you. If you are unable to get assistance, then go to the nearest Emergency Department and ask the staff to contact the IV team for assistance.

## 2023-09-25 ENCOUNTER — Inpatient Hospital Stay

## 2023-09-25 VITALS — BP 108/78 | HR 73 | Temp 98.5°F | Resp 15

## 2023-09-25 DIAGNOSIS — Z95828 Presence of other vascular implants and grafts: Secondary | ICD-10-CM

## 2023-09-25 MED ORDER — SODIUM CHLORIDE 0.9% FLUSH
10.0000 mL | Freq: Once | INTRAVENOUS | Status: AC
Start: 2023-09-25 — End: 2023-09-25
  Administered 2023-09-25: 10 mL

## 2023-09-26 ENCOUNTER — Ambulatory Visit (HOSPITAL_COMMUNITY)
Admission: RE | Admit: 2023-09-26 | Discharge: 2023-09-26 | Disposition: A | Discharge status: Home / Self Care | Source: Ambulatory Visit | Attending: Physician Assistant | Admitting: Physician Assistant

## 2023-09-26 DIAGNOSIS — C7A1 Malignant poorly differentiated neuroendocrine tumors: Secondary | ICD-10-CM | POA: Diagnosis present

## 2023-09-26 MED ORDER — SODIUM CHLORIDE (PF) 0.9 % IJ SOLN
INTRAMUSCULAR | Status: AC
  Filled 2023-09-26: qty 50

## 2023-09-26 MED ORDER — IOHEXOL 300 MG/ML  SOLN
100.0000 mL | Freq: Once | INTRAMUSCULAR | Status: AC | PRN
  Administered 2023-09-26: 100 mL via INTRAVENOUS

## 2023-10-07 ENCOUNTER — Inpatient Hospital Stay: Attending: Hematology

## 2023-10-07 ENCOUNTER — Inpatient Hospital Stay

## 2023-10-07 ENCOUNTER — Inpatient Hospital Stay (HOSPITAL_BASED_OUTPATIENT_CLINIC_OR_DEPARTMENT_OTHER): Admitting: Hematology

## 2023-10-07 VITALS — BP 123/90 | HR 78 | Temp 97.9°F | Resp 20 | Wt 308.3 lb

## 2023-10-07 DIAGNOSIS — C7A1 Malignant poorly differentiated neuroendocrine tumors: Secondary | ICD-10-CM | POA: Diagnosis present

## 2023-10-07 DIAGNOSIS — C7B8 Other secondary neuroendocrine tumors: Secondary | ICD-10-CM | POA: Insufficient documentation

## 2023-10-07 DIAGNOSIS — Z79631 Long term (current) use of antimetabolite agent: Secondary | ICD-10-CM | POA: Insufficient documentation

## 2023-10-07 DIAGNOSIS — F17210 Nicotine dependence, cigarettes, uncomplicated: Secondary | ICD-10-CM | POA: Diagnosis not present

## 2023-10-07 DIAGNOSIS — Z7189 Other specified counseling: Secondary | ICD-10-CM

## 2023-10-07 DIAGNOSIS — Z79634 Long term (current) use of topoisomerase inhibitor: Secondary | ICD-10-CM | POA: Insufficient documentation

## 2023-10-07 DIAGNOSIS — Z5111 Encounter for antineoplastic chemotherapy: Secondary | ICD-10-CM

## 2023-10-07 DIAGNOSIS — Z95828 Presence of other vascular implants and grafts: Secondary | ICD-10-CM

## 2023-10-07 LAB — CMP (CANCER CENTER ONLY)
ALT: 15 U/L (ref 0–44)
AST: 18 U/L (ref 15–41)
Albumin: 4.1 g/dL (ref 3.5–5.0)
Alkaline Phosphatase: 95 U/L (ref 38–126)
Anion gap: 7 (ref 5–15)
BUN: 13 mg/dL (ref 6–20)
CO2: 26 mmol/L (ref 22–32)
Calcium: 9.3 mg/dL (ref 8.9–10.3)
Chloride: 106 mmol/L (ref 98–111)
Creatinine: 0.76 mg/dL (ref 0.61–1.24)
GFR, Estimated: >60 mL/min (ref >60)
Glucose, Bld: 93 mg/dL (ref 70–99)
Potassium: 3.7 mmol/L (ref 3.5–5.1)
Sodium: 139 mmol/L (ref 135–145)
Total Bilirubin: 1 mg/dL (ref 0.0–1.2)
Total Protein: 7.5 g/dL (ref 6.5–8.1)

## 2023-10-07 LAB — CBC WITH DIFFERENTIAL (CANCER CENTER ONLY)
Abs Immature Granulocytes: 0 K/uL (ref 0.00–0.07)
Basophils Absolute: 0 K/uL (ref 0.0–0.1)
Basophils Relative: 1 %
Eosinophils Absolute: 0.2 K/uL (ref 0.0–0.5)
Eosinophils Relative: 6 %
HCT: 34.7 % — ABNORMAL LOW (ref 39.0–52.0)
Hemoglobin: 11.8 g/dL — ABNORMAL LOW (ref 13.0–17.0)
Immature Granulocytes: 0 %
Lymphocytes Relative: 20 %
Lymphs Abs: 0.7 K/uL (ref 0.7–4.0)
MCH: 28.5 pg (ref 26.0–34.0)
MCHC: 34 g/dL (ref 30.0–36.0)
MCV: 83.8 fL (ref 80.0–100.0)
Monocytes Absolute: 0.4 K/uL (ref 0.1–1.0)
Monocytes Relative: 12 %
Neutro Abs: 2.2 K/uL (ref 1.7–7.7)
Neutrophils Relative %: 61 %
Platelet Count: 134 K/uL — ABNORMAL LOW (ref 150–400)
RBC: 4.14 MIL/uL — ABNORMAL LOW (ref 4.22–5.81)
RDW: 15 % (ref 11.5–15.5)
WBC Count: 3.5 K/uL — ABNORMAL LOW (ref 4.0–10.5)
nRBC: 0 % (ref 0.0–0.2)

## 2023-10-07 MED ORDER — DEXAMETHASONE SODIUM PHOSPHATE 10 MG/ML IJ SOLN
10.0000 mg | Freq: Once | INTRAMUSCULAR | Status: AC
  Administered 2023-10-07: 10 mg via INTRAVENOUS
  Filled 2023-10-07: qty 1

## 2023-10-07 MED ORDER — ATROPINE SULFATE 1 MG/ML IV SOLN
0.5000 mg | Freq: Once | INTRAVENOUS | Status: AC | PRN
  Administered 2023-10-07: 0.5 mg via INTRAVENOUS
  Filled 2023-10-07: qty 1

## 2023-10-07 MED ORDER — SODIUM CHLORIDE 0.9 % IV SOLN
400.0000 mg/m2 | Freq: Once | INTRAVENOUS | Status: AC
  Administered 2023-10-07: 1020 mg via INTRAVENOUS
  Filled 2023-10-07: qty 51

## 2023-10-07 MED ORDER — SODIUM CHLORIDE 0.9 % IV SOLN: Freq: Once | INTRAVENOUS | Status: AC

## 2023-10-07 MED ORDER — SODIUM CHLORIDE 0.9% FLUSH
10.0000 mL | Freq: Once | INTRAVENOUS | Status: AC
  Administered 2023-10-07: 10 mL

## 2023-10-07 MED ORDER — SODIUM CHLORIDE 0.9% FLUSH: 10.0000 mL | INTRAVENOUS | Status: DC | PRN

## 2023-10-07 MED ORDER — SODIUM CHLORIDE 0.9 % IV SOLN
125.0000 mg/m2 | Freq: Once | INTRAVENOUS | Status: AC
  Administered 2023-10-07: 300 mg via INTRAVENOUS
  Filled 2023-10-07: qty 15

## 2023-10-07 MED ORDER — SODIUM CHLORIDE 0.9 % IV SOLN
2400.0000 mg/m2 | INTRAVENOUS | Status: AC
  Administered 2023-10-07: 6100 mg via INTRAVENOUS
  Filled 2023-10-07: qty 122

## 2023-10-07 MED ORDER — PALONOSETRON HCL INJECTION 0.25 MG/5ML
0.2500 mg | Freq: Once | INTRAVENOUS | Status: AC
  Administered 2023-10-07: 0.25 mg via INTRAVENOUS
  Filled 2023-10-07: qty 5

## 2023-10-07 MED ORDER — FLUOROURACIL CHEMO INJECTION 2.5 GM/50ML
400.0000 mg/m2 | Freq: Once | INTRAVENOUS | Status: AC
  Administered 2023-10-07: 1000 mg via INTRAVENOUS
  Filled 2023-10-07: qty 20

## 2023-10-07 NOTE — Patient Instructions (Signed)
 [facility] - A DEPT OF [facility]  Discharge Instructions: Thank you for choosing [facility] to provide your oncology and hematology care.   If you have a lab appointment with the Cancer Center, please go directly to the Cancer Center and check in at the registration area.   Wear comfortable clothing and clothing appropriate for easy access to any Portacath or PICC line.   We strive to give you quality time with your provider. You may need to reschedule your appointment if you arrive late (15 or more minutes).  Arriving late affects you and other patients whose appointments are after yours.  Also, if you miss three or more appointments without notifying the office, you may be dismissed from the clinic at the provider's discretion.      For prescription refill requests, have your pharmacy contact our office and allow 72 hours for refills to be completed.    Today you received the following chemotherapy and/or immunotherapy agents irinotecan , leucovorin , adrucil     To help prevent nausea and vomiting after your treatment, we encourage you to take your nausea medication as directed.  BELOW ARE SYMPTOMS THAT SHOULD BE REPORTED IMMEDIATELY: *FEVER GREATER THAN 100.4 F (38 C) OR HIGHER *CHILLS OR SWEATING *NAUSEA AND VOMITING THAT IS NOT CONTROLLED WITH YOUR NAUSEA MEDICATION *UNUSUAL SHORTNESS OF BREATH *UNUSUAL BRUISING OR BLEEDING *URINARY PROBLEMS (pain or burning when urinating, or frequent urination) *BOWEL PROBLEMS (unusual diarrhea, constipation, pain near the anus) TENDERNESS IN MOUTH AND THROAT WITH OR WITHOUT PRESENCE OF ULCERS (sore throat, sores in mouth, or a toothache) UNUSUAL RASH, SWELLING OR PAIN  UNUSUAL VAGINAL DISCHARGE OR ITCHING   Items with * indicate a potential emergency and should be followed up as soon as possible or go to the Emergency Department if any problems should occur.  Please show the CHEMOTHERAPY ALERT  CARD or IMMUNOTHERAPY ALERT CARD at check-in to the Emergency Department and triage nurse.  Should you have questions after your visit or need to cancel or reschedule your appointment, please contact [facility] - A DEPT OF [facility]  Dept: [phone]  and follow the prompts.  Office hours are 8:00 a.m. to 4:30 p.m. Monday - Friday. Please note that voicemails left after 4:00 p.m. may not be returned until the following business day.  We are closed weekends and major holidays. You have access to a nurse at all times for urgent questions. Please call the main number to the clinic Dept: [phone] and follow the prompts.   For any non-urgent questions, you may also contact your provider using MyChart. We now offer e-Visits for anyone 65 and older to request care online for non-urgent symptoms. For details visit [email or web address].   Also download the MyChart app! Go to the app store, search MyChart, open the app, select [place], and log in with your MyChart username and password.

## 2023-10-09 ENCOUNTER — Inpatient Hospital Stay

## 2023-10-14 ENCOUNTER — Encounter: Payer: Self-pay | Admitting: Hematology

## 2023-10-14 NOTE — Progress Notes (Signed)
 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  04/24/2022-07/17/2022: Received 6 cycles of FOLFOX, oxaliplatin  discontinued with 6th cycle due to elevated LFTs.   CURRENT TREATMENT: FOLFIRI, started 07/31/2022  INTERVAL HISTORY:  Leon Fischer. Is a 33 y.o. male returns today for a follow up for high grade neuroendocrine carcinoma prior to next cycle of palliative FOLFIRI chemotherapy. He is accompanied by his mother for this appointment. He notes no acute new symptoms. Notes no new toxicities from his current treatment. He has been working in his week of chemotherapy and notes that he is tolerating this well. Labs done today were discussed with him in detail  MEDICAL HISTORY:  Past Medical History:  Diagnosis Date   Asthma    Neuroendocrine cancer (HCC)     SURGICAL HISTORY: Past Surgical History:  Procedure Laterality Date   BILIARY DILATION  07/13/2021   Procedure: BILIARY DILATION;  Surgeon: Leon Kitchens, MD;  Location: [name];  Service: Gastroenterology;;   BILIARY STENT PLACEMENT N/A 07/13/2021   Procedure: BILIARY STENT PLACEMENT;  Surgeon: Leon Kitchens, MD;  [name];  Service: Gastroenterology;  Laterality: N/A;   BILIARY STENT PLACEMENT N/A 11/05/2021   Procedure: BILIARY STENT PLACEMENT;  Surgeon: Leon Kitchens, MD;  [name];  Service: Gastroenterology;  Laterality: N/A;   BIOPSY  07/13/2021   Procedure: BIOPSY;  Surgeon: Leon Fischer., MD;  Location: [name];  Service:  Gastroenterology;;   CHOLECYSTECTOMY N/A 11/07/2021   Procedure: LAPAROSCOPIC CHOLECYSTECTOMY;  Surgeon: Leon Mitzie LABOR, MD;  [facility];  Service: General;  Laterality: N/A;   ENDOSCOPIC RETROGRADE CHOLANGIOPANCREATOGRAPHY (ERCP) WITH PROPOFOL  N/A 07/13/2021   Procedure: ENDOSCOPIC RETROGRADE CHOLANGIOPANCREATOGRAPHY (ERCP) WITH PROPOFOL ;  Surgeon: Leon Kitchens, MD;  [name];  Service: Gastroenterology;  Laterality: N/A;   ERCP N/A 11/05/2021   Procedure: ENDOSCOPIC RETROGRADE CHOLANGIOPANCREATOGRAPHY (ERCP);  Surgeon: Leon Kitchens, MD;  Location: [name];  Service: Gastroenterology;  Laterality: N/A;   ERCP N/A 06/20/2023   Procedure: ERCP, WITH INTERVENTION IF INDICATED;  Surgeon: Leon Jasper, MD;  [name];  Service: Gastroenterology;  Laterality: N/A;   ESOPHAGOGASTRODUODENOSCOPY (EGD) WITH PROPOFOL  N/A 07/13/2021   Procedure: ESOPHAGOGASTRODUODENOSCOPY (EGD) WITH PROPOFOL ;  Surgeon: Leon Fischer., MD;  [name];  Service: Gastroenterology;  Laterality: N/A;   EUS N/A 07/13/2021   Procedure: UPPER ENDOSCOPIC ULTRASOUND (EUS) LINEAR;  Surgeon: Leon Fischer., MD;  [name];  Service: Gastroenterology;  Laterality: N/A;   FINE NEEDLE ASPIRATION  07/13/2021   Procedure: FINE NEEDLE ASPIRATION (FNA) LINEAR;  Surgeon: Leon Fischer., MD;  Location: [name];  Service: Gastroenterology;;   IR IMAGING GUIDED PORT INSERTION  07/31/2021   SPHINCTEROTOMY  07/13/2021   Procedure: ANNETT;  Surgeon: Leon Kitchens, MD;  [name];  Service: Gastroenterology;;    SOCIAL HISTORY: Social History   Socioeconomic History   Marital status: Single    Spouse name: Not on file   Number of children: 0   Years of education: Not on file   Highest education level: Some college, no degree  Occupational History   Not on file  Tobacco Use   Smoking status:  Some Days    Types: Cigarettes   Smokeless  tobacco: Never  Vaping Use   Vaping status: Never Used  Substance and Sexual Activity   Alcohol use: Yes    Comment: social   Drug use: No   Sexual activity: Never  Other Topics Concern   Not on file  Social History Narrative   Not on file   Social Drivers of Fischer   Financial Resource Strain: Low Risk  (04/14/2017)   Overall Financial Resource Strain (CARDIA)    Difficulty of Paying Living Expenses: Not hard at all  Food Insecurity: No Food Insecurity (06/18/2023)   Hunger Vital Sign    Worried About Running Out of Food in the Last Year: Never true    Ran Out of Food in the Last Year: Never true  Transportation Needs: No Transportation Needs (06/18/2023)   PRAPARE - Administrator, Civil Service (Medical): No    Lack of Transportation (Non-Medical): No  Physical Activity: Inactive (04/14/2017)   Exercise Vital Sign    Days of Exercise per Week: 0 days    Minutes of Exercise per Session: 0 min  Stress: Stress Concern Present (04/14/2017)   Leon Fischer - Occupational Stress Questionnaire    Feeling of Stress : Rather much  Social Connections: Unknown (06/19/2021)   Received from [facility]   Social Network    Social Network: Not on file  Intimate Partner Violence: Not At Risk (06/18/2023)   Humiliation, Afraid, Rape, and Kick questionnaire    Fear of Current or Ex-Partner: No    Emotionally Abused: No    Physically Abused: No    Sexually Abused: No    FAMILY HISTORY: Family History  Problem Relation Age of Onset   Drug abuse Father    ALLERGIES:  is allergic to shellfish allergy.  MEDICATIONS:  Current Outpatient Medications  Medication Sig Dispense Refill   acetaminophen  (TYLENOL ) 500 MG tablet Take 1,000 mg by mouth every 6 (six) hours as needed for mild pain.     albuterol  (VENTOLIN  HFA) 108 (90 Base) MCG/ACT inhaler Inhale 1-2 puffs into the lungs every 6 (six) hours as needed for wheezing or shortness of breath. 18 g 0    methocarbamol  (ROBAXIN ) 500 MG tablet Take 1 tablet (500 mg total) by mouth 2 (two) times daily. (Patient taking differently: Take 500 mg by mouth 2 (two) times daily as needed for muscle spasms.) 20 tablet 0   ondansetron  (ZOFRAN ) 8 MG tablet Take 1 tablet (8 mg total) by mouth every 8 (eight) hours as needed for nausea, vomiting or refractory nausea / vomiting. Start on the third day after chemotherapy. 30 tablet 1   No current facility-administered medications for this visit.    REVIEW OF SYSTEMS:   .10 Point review of Systems was done is negative except as noted above.  PHYSICAL EXAMINATION: ECOG PERFORMANCE STATUS: 1 - Symptomatic but completely ambulatory Vitals:   10/07/23 1229  BP: (!) 123/90  Pulse: 78  Resp: 20  Temp: 97.9 F (36.6 C)  SpO2: 100%   . GENERAL:alert, in no acute distress and comfortable SKIN: no acute rashes, no significant lesions EYES: conjunctiva are pink and non-injected, sclera anicteric OROPHARYNX: MMM, no exudates, no oropharyngeal erythema or ulceration NECK: supple, no JVD LYMPH:  no palpable lymphadenopathy in the cervical, axillary or inguinal regions LUNGS: clear to auscultation b/l with normal respiratory effort HEART: regular rate & rhythm ABDOMEN:  normoactive bowel sounds , non tender, not  distended. Extremity: no pedal edema PSYCH: alert & oriented x 3 with fluent speech NEURO: no focal motor/sensory deficits   LABORATORY DATA:  I have reviewed the data as listed  .    Latest Ref Rng & Units 10/07/2023   11:37 AM 09/23/2023   10:52 AM 09/09/2023   11:43 AM  CBC  WBC 4.0 - 10.5 K/uL 3.5  2.5  3.4   Hemoglobin 13.0 - 17.0 g/dL 88.1  89.7  88.5   Hematocrit 39.0 - 52.0 % 34.7  31.4  34.0   Platelets 150 - 400 K/uL 134  125  134     .    Latest Ref Rng & Units 10/07/2023   11:37 AM 09/23/2023   10:52 AM 09/09/2023   11:43 AM  CMP  Glucose 70 - 99 mg/dL 93  867  96   BUN 6 - 20 mg/dL 13  7  10    Creatinine 0.61 - 1.24 mg/dL  9.23  9.26  9.20   Sodium 135 - 145 mmol/L 139  141  140   Potassium 3.5 - 5.1 mmol/L 3.7  3.6  3.7   Chloride 98 - 111 mmol/L 106  110  108   CO2 22 - 32 mmol/L 26  26  27    Calcium  8.9 - 10.3 mg/dL 9.3  8.6  9.2   Total Protein 6.5 - 8.1 g/dL 7.5  6.7  7.4   Total Bilirubin 0.0 - 1.2 mg/dL 1.0  0.5  0.7   Alkaline Phos 38 - 126 U/L 95  99  122   AST 15 - 41 U/L 18  22  22    ALT 0 - 44 U/L 15  27  26     Cytology done 07/13/2021 revealed FINAL MICROSCOPIC DIAGNOSIS:  A. PANCREAS, HEAD, FINE NEEDLE ASPIRATION:  - Malignant cells present  - Poorly differentiated/high-grade neuroendocrine carcinoma (see  comment)   B. CBD STRICTURE, BRUSHING:  - Atypical cells suspicious for tumor   C. BILIARY DILATION, BALLOON, REMOVAL:  - Atypical cells suspicious for tumor   Surgical pathology done 07/13/2021 revealed FINAL MICROSCOPIC DIAGNOSIS:   A. STOMACH, BIOPSY:  Reactive gastropathy and minimal chronic gastritis with lymphoid  aggregate  Negative for H. pylori, intestinal metaplasia, dysplasia and carcinoma  RADIOGRAPHIC STUDIES: I have personally reviewed the radiological images as listed and agreed with the findings in the report.  ASSESSMENT & PLAN:  Leon Fischer. Is a 33 y.o. male who presents for a follow up for high grade neuroendocrine carcinoma.   1. 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 -Cytology done 07/13/2021 shows presence of malignant cells in pancreas and poorly differentiated/high grade neuroendocrine carcinoma. -Received 6 cycles of first line chemotherapy with carboplatin  and etoposide  from 08/01/2021-12/26/2021.Treatment was discontinued after CT CAP from 04/04/2022 showed progression of disease. -Received 6 cycles of second line chemotherapy with FOLFOX from 04/24/2022-07/17/2022. Oxaliplatin  discontinued with 6th cycle due to elevated LFTs. -Started third line chemotherapy with FOLFIRI on  07/31/2022 -Most recent CT CAP from 06/18/2023 showed overall stable disease.   #hx of Elevated LFTs: --Secondary to biliary stent occlusion s/p ERCP with replacement of CBD stent on 06/20/2023 - Dr. Saintclair is his gastroenterologist   Plan - Patient's labs from today were discussed with him in details - CBC stable with improvement in hemoglobin up to 11.8, WBC count 3.5k, platelets of 134k - CMP within normal limits Patient has no acute new symptoms suggestive of progression of his cancer.  Patient has no notable toxicities from his current FOLFIRI every 2 weeks.  We shall continue this with similar supportive medications. Patient had a CT chest abdomen pelvis on 09/25/2020.  Results from this have not been read yet and radiology has been informed to speed up the read.  SABRA.The total time spent in the appointment was 30 minutes* .  All of the patient's questions were answered with apparent satisfaction. The patient knows to call the clinic with any problems, questions or concerns.   Emaline Saran MD MS AAHIVMS [facility] Hematology/Oncology Physician [facility]  .*Total Encounter Time as defined by the Centers for Medicare and Medicaid Services includes, in addition to the face-to-face time of a patient visit (documented in the note above) non-face-to-face time: obtaining and reviewing outside history, ordering and reviewing medications, tests or procedures, care coordination (communications with other Fischer care professionals or caregivers) and documentation in the medical record.   ADDENDUM  .CT CHEST ABDOMEN PELVIS W CONTRAST Result Date: 10/11/2023 CLINICAL DATA:  Metastatic high-grade neuroendocrine tumor, assess treatment response. * Tracking Code: BO * EXAM: CT CHEST, ABDOMEN, AND PELVIS WITH CONTRAST TECHNIQUE: Multidetector CT imaging of the chest, abdomen and pelvis was performed following the standard protocol during bolus administration of intravenous contrast. RADIATION  DOSE REDUCTION: This exam was performed according to the departmental dose-optimization program which includes automated exposure control, adjustment of the mA and/or kV according to patient size and/or use of iterative reconstruction technique. CONTRAST:  OMNIPAQUE  IOHEXOL  300 MG/ML  SOLN COMPARISON:  Multiple priors including MRI Jun 19, 2023 and CT May 14 25 and CT Jun 05, 2023. FINDINGS: CT CHEST FINDINGS Cardiovascular: Right chest Port-A-Cath with tip near the superior cavoatrial junction. Normal caliber thoracic aorta. Normal size heart. No significant pericardial effusion/thickening. Mediastinum/Nodes: No suspicious thyroid nodule. No pathologically enlarged mediastinal, hilar or axillary lymph nodes. The esophagus is grossly unremarkable. Lungs/Pleura: Stable partially calcified 3 mm nodule in the anterior right upper lobe on image 35/4. No new suspicious pulmonary nodules or masses. No pleural effusion. No pneumothorax. Musculoskeletal: No aggressive lytic or blastic lesion of bone. CT ABDOMEN PELVIS FINDINGS Hepatobiliary: Bilobar hepatic metastasis better seen on MRI. For reference: -lateral left lobe of the liver lesion measuring 13 mm on image 40/2 previously measured 15 mm -lesion in the peripheral right lobe of the liver measuring 11 mm on image 42/2 previously measured 7 mm. -lesion in the caudate lobe measures 14 mm on image 42/2 previously 7 mm. Gallbladder surgically absent. Metallic common bile duct stent in place. Pneumobilia compatible with stent patency. Pancreas: Masslike area in the pancreatic head difficult to definitively measure but measures proximally 5.2 cm on image 60/2 previously 4.8 cm when remeasured for consistency. There is similar upstream pancreatic ductal dilation and parenchymal atrophy. Spleen: Similar splenomegaly measuring 16.4 cm in maximum axial dimension. Adrenals/Urinary Tract: No suspicious adrenal nodule/mass. No hydronephrosis. Kidneys demonstrate symmetric  enhancement. Mild symmetric wall thickening of a nondistended urinary bladder. Stomach/Bowel: Stomach is unremarkable for degree of distension. No pathologic dilation of small or large bowel. No evidence of acute bowel inflammation. Vascular/Lymphatic: Normal caliber abdominal aorta. Smooth IVC contours. Prominent retroperitoneal and mesenteric lymph nodes are similar prior. For reference: -retrocaval lymph node measures 10 mm in short axis on image 65/2 previously 11 mm. Reproductive: Prostate is unremarkable. Other: Trace pelvic free fluid with similar lobular peritoneal thickening for instance on image 91/5. Musculoskeletal: No aggressive lytic or blastic lesion of bone. IMPRESSION: 1. Masslike area in the  pancreatic head is difficult to definitively measure but measures proximally 5.2 cm previously 4.8 cm when remeasured for consistency. 2. Bilobar hepatic metastasis are better seen on MRI but appear slightly increased in size from prior. 3. Similar splenomegaly. 4. Trace pelvic free fluid with similar lobular peritoneal thickening. 5. Stable prominent retroperitoneal and mesenteric lymph nodes. 6. Stable partially calcified 3 mm nodule in the anterior right upper lobe. No new suspicious pulmonary nodules or masses. Electronically Signed   By: Reyes Holder M.D.   On: 10/11/2023 04:40   - CT chest abdomen pelvis shows some progression in his liver lesion of the main pancreatic mass. - Will call him to discuss results and goals of care and other palliative treatment options.

## 2023-10-21 ENCOUNTER — Other Ambulatory Visit: Payer: Self-pay | Admitting: Physician Assistant

## 2023-10-22 ENCOUNTER — Encounter: Payer: Self-pay | Admitting: Hematology

## 2023-10-22 ENCOUNTER — Inpatient Hospital Stay

## 2023-10-22 ENCOUNTER — Inpatient Hospital Stay (HOSPITAL_BASED_OUTPATIENT_CLINIC_OR_DEPARTMENT_OTHER): Admitting: Physician Assistant

## 2023-10-22 VITALS — BP 128/90 | HR 88 | Temp 97.6°F | Resp 18 | Ht 72.0 in | Wt 312.0 lb

## 2023-10-22 DIAGNOSIS — Z5111 Encounter for antineoplastic chemotherapy: Secondary | ICD-10-CM | POA: Diagnosis not present

## 2023-10-22 DIAGNOSIS — C7A1 Malignant poorly differentiated neuroendocrine tumors: Secondary | ICD-10-CM

## 2023-10-22 DIAGNOSIS — R110 Nausea: Secondary | ICD-10-CM

## 2023-10-22 DIAGNOSIS — Z7189 Other specified counseling: Secondary | ICD-10-CM

## 2023-10-22 LAB — CMP (CANCER CENTER ONLY)
ALT: 17 U/L (ref 0–44)
AST: 17 U/L (ref 15–41)
Albumin: 4.2 g/dL (ref 3.5–5.0)
Alkaline Phosphatase: 100 U/L (ref 38–126)
Anion gap: 6 (ref 5–15)
BUN: 11 mg/dL (ref 6–20)
CO2: 27 mmol/L (ref 22–32)
Calcium: 9.2 mg/dL (ref 8.9–10.3)
Chloride: 107 mmol/L (ref 98–111)
Creatinine: 0.81 mg/dL (ref 0.61–1.24)
GFR, Estimated: >60 mL/min (ref >60)
Glucose, Bld: 145 mg/dL — ABNORMAL HIGH (ref 70–99)
Potassium: 3.9 mmol/L (ref 3.5–5.1)
Sodium: 140 mmol/L (ref 135–145)
Total Bilirubin: 0.8 mg/dL (ref 0.0–1.2)
Total Protein: 7.6 g/dL (ref 6.5–8.1)

## 2023-10-22 LAB — CBC WITH DIFFERENTIAL (CANCER CENTER ONLY)
Abs Immature Granulocytes: 0.01 K/uL (ref 0.00–0.07)
Basophils Absolute: 0 K/uL (ref 0.0–0.1)
Basophils Relative: 1 %
Eosinophils Absolute: 0.2 K/uL (ref 0.0–0.5)
Eosinophils Relative: 7 %
HCT: 35.9 % — ABNORMAL LOW (ref 39.0–52.0)
Hemoglobin: 11.7 g/dL — ABNORMAL LOW (ref 13.0–17.0)
Immature Granulocytes: 0 %
Lymphocytes Relative: 19 %
Lymphs Abs: 0.6 K/uL — ABNORMAL LOW (ref 0.7–4.0)
MCH: 28.1 pg (ref 26.0–34.0)
MCHC: 32.6 g/dL (ref 30.0–36.0)
MCV: 86.1 fL (ref 80.0–100.0)
Monocytes Absolute: 0.3 K/uL (ref 0.1–1.0)
Monocytes Relative: 10 %
Neutro Abs: 2 K/uL (ref 1.7–7.7)
Neutrophils Relative %: 63 %
Platelet Count: 144 K/uL — ABNORMAL LOW (ref 150–400)
RBC: 4.17 MIL/uL — ABNORMAL LOW (ref 4.22–5.81)
RDW: 16 % — ABNORMAL HIGH (ref 11.5–15.5)
WBC Count: 3.2 K/uL — ABNORMAL LOW (ref 4.0–10.5)
nRBC: 0 % (ref 0.0–0.2)

## 2023-10-22 MED ORDER — ATROPINE SULFATE 1 MG/ML IV SOLN
0.5000 mg | Freq: Once | INTRAVENOUS | Status: AC | PRN
  Administered 2023-10-22: 0.5 mg via INTRAVENOUS
  Filled 2023-10-22: qty 1

## 2023-10-22 MED ORDER — SODIUM CHLORIDE 0.9 % IV SOLN: Freq: Once | INTRAVENOUS | Status: AC

## 2023-10-22 MED ORDER — FLUOROURACIL CHEMO INJECTION 2.5 GM/50ML
400.0000 mg/m2 | Freq: Once | INTRAVENOUS | Status: AC
  Administered 2023-10-22: 1000 mg via INTRAVENOUS
  Filled 2023-10-22: qty 20

## 2023-10-22 MED ORDER — DEXAMETHASONE SODIUM PHOSPHATE 10 MG/ML IJ SOLN
10.0000 mg | Freq: Once | INTRAMUSCULAR | Status: AC
  Administered 2023-10-22: 10 mg via INTRAVENOUS
  Filled 2023-10-22: qty 1

## 2023-10-22 MED ORDER — SODIUM CHLORIDE 0.9 % IV SOLN
2400.0000 mg/m2 | INTRAVENOUS | Status: DC
  Administered 2023-10-22: 6100 mg via INTRAVENOUS
  Filled 2023-10-22: qty 122

## 2023-10-22 MED ORDER — SODIUM CHLORIDE 0.9 % IV SOLN
125.0000 mg/m2 | Freq: Once | INTRAVENOUS | Status: AC
  Administered 2023-10-22: 300 mg via INTRAVENOUS
  Filled 2023-10-22: qty 15

## 2023-10-22 MED ORDER — SODIUM CHLORIDE 0.9 % IV SOLN
400.0000 mg/m2 | Freq: Once | INTRAVENOUS | Status: AC
  Administered 2023-10-22: 1020 mg via INTRAVENOUS
  Filled 2023-10-22: qty 17.5

## 2023-10-22 MED ORDER — PALONOSETRON HCL INJECTION 0.25 MG/5ML
0.2500 mg | Freq: Once | INTRAVENOUS | Status: AC
  Administered 2023-10-22: 0.25 mg via INTRAVENOUS
  Filled 2023-10-22: qty 5

## 2023-10-22 NOTE — Patient Instructions (Signed)
 [facility] - A DEPT OF [facility]  Discharge Instructions: Thank you for choosing [facility] to provide your oncology and hematology care.   If you have a lab appointment with the Cancer Center, please go directly to the Cancer Center and check in at the registration area.   Wear comfortable clothing and clothing appropriate for easy access to any Portacath or PICC line.   We strive to give you quality time with your provider. You may need to reschedule your appointment if you arrive late (15 or more minutes).  Arriving late affects you and other patients whose appointments are after yours.  Also, if you miss three or more appointments without notifying the office, you may be dismissed from the clinic at the provider's discretion.      For prescription refill requests, have your pharmacy contact our office and allow 72 hours for refills to be completed.    Today you received the following chemotherapy and/or immunotherapy agents: Irinotecan, Leucovorin, 5FU      To help prevent nausea and vomiting after your treatment, we encourage you to take your nausea medication as directed.  BELOW ARE SYMPTOMS THAT SHOULD BE REPORTED IMMEDIATELY: *FEVER GREATER THAN 100.4 F (38 C) OR HIGHER *CHILLS OR SWEATING *NAUSEA AND VOMITING THAT IS NOT CONTROLLED WITH YOUR NAUSEA MEDICATION *UNUSUAL SHORTNESS OF BREATH *UNUSUAL BRUISING OR BLEEDING *URINARY PROBLEMS (pain or burning when urinating, or frequent urination) *BOWEL PROBLEMS (unusual diarrhea, constipation, pain near the anus) TENDERNESS IN MOUTH AND THROAT WITH OR WITHOUT PRESENCE OF ULCERS (sore throat, sores in mouth, or a toothache) UNUSUAL RASH, SWELLING OR PAIN  UNUSUAL VAGINAL DISCHARGE OR ITCHING   Items with * indicate a potential emergency and should be followed up as soon as possible or go to the Emergency Department if any problems should occur.  Please show the CHEMOTHERAPY ALERT CARD  or IMMUNOTHERAPY ALERT CARD at check-in to the Emergency Department and triage nurse.  Should you have questions after your visit or need to cancel or reschedule your appointment, please contact [facility] - A DEPT OF [facility]  Dept: [phone]  and follow the prompts.  Office hours are 8:00 a.m. to 4:30 p.m. Monday - Friday. Please note that voicemails left after 4:00 p.m. may not be returned until the following business day.  We are closed weekends and major holidays. You have access to a nurse at all times for urgent questions. Please call the main number to the clinic Dept: [phone] and follow the prompts.   For any non-urgent questions, you may also contact your provider using MyChart. We now offer e-Visits for anyone 50 and older to request care online for non-urgent symptoms. For details visit [email or web address].   Also download the MyChart app! Go to the app store, search "MyChart", open the app, select [facility], and log in with your MyChart username and password.

## 2023-10-23 ENCOUNTER — Other Ambulatory Visit: Payer: Self-pay

## 2023-10-23 DIAGNOSIS — C7A1 Malignant poorly differentiated neuroendocrine tumors: Secondary | ICD-10-CM

## 2023-10-23 DIAGNOSIS — Z7189 Other specified counseling: Secondary | ICD-10-CM

## 2023-10-23 MED ORDER — ONDANSETRON HCL 8 MG PO TABS: 8.0000 mg | ORAL_TABLET | Freq: Three times a day (TID) | ORAL | 1 refills | Status: DC | PRN

## 2023-10-24 ENCOUNTER — Inpatient Hospital Stay

## 2023-10-26 ENCOUNTER — Encounter: Payer: Self-pay | Admitting: Hematology

## 2023-10-26 MED ORDER — PROCHLORPERAZINE MALEATE 10 MG PO TABS: 10.0000 mg | ORAL_TABLET | Freq: Four times a day (QID) | ORAL | 0 refills | Status: AC | PRN

## 2023-10-26 NOTE — Progress Notes (Signed)
 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  04/24/2022-07/17/2022: Received 6 cycles of FOLFOX, oxaliplatin  discontinued with 6th cycle due to elevated LFTs.   CURRENT TREATMENT: FOLFIRI, started 07/31/2022  INTERVAL HISTORY:  Leon Fischer. Is a 33 y.o. male returns today for a follow up for high grade neuroendocrine carcinoma prior to Cycle 27, Day 1 of FOLFIRI chemotherapy.He is accompanied by his mother for this visit.   Mr. Salinger reports continues to tolerate chemotherapy. His energy and appetite are unchanged. His nausea is well controlled with compazine . He denies any vomiting episodes. His bowel movements are recurrent. He denies easy bruising or overt signs of bleeding such as hematochezia or melena. He denies fevers, chills, night sweats, shortness of breath, chest pain or cough. He has no other complaints.  MEDICAL HISTORY:  Past Medical History:  Diagnosis Date   Asthma    Neuroendocrine cancer (HCC)     SURGICAL HISTORY: Past Surgical History:  Procedure Laterality Date   BILIARY DILATION  07/13/2021   Procedure: BILIARY DILATION;  Surgeon: [name], MD;  Location: [name];  Service: Gastroenterology;;   BILIARY STENT PLACEMENT N/A 07/13/2021   Procedure: BILIARY STENT PLACEMENT;  Surgeon: [name], MD;  Location: [name];  Service: Gastroenterology;  Laterality: N/A;   BILIARY STENT PLACEMENT N/A 11/05/2021   Procedure: BILIARY STENT PLACEMENT;  Surgeon: [name], MD;  Location: [name];  Service: Gastroenterology;  Laterality: N/A;   BIOPSY   07/13/2021   Procedure: BIOPSY;  Surgeon: [name]., MD;  Location: [name];  Service: Gastroenterology;;   CHOLECYSTECTOMY N/A 11/07/2021   Procedure: LAPAROSCOPIC CHOLECYSTECTOMY;  Surgeon: [name], MD;  Location: [facility];  Service: General;  Laterality: N/A;   ENDOSCOPIC RETROGRADE CHOLANGIOPANCREATOGRAPHY (ERCP) WITH PROPOFOL  N/A 07/13/2021   Procedure: ENDOSCOPIC RETROGRADE CHOLANGIOPANCREATOGRAPHY (ERCP) WITH PROPOFOL ;  Surgeon: [name], MD;  Location: [name];  Service: Gastroenterology;  Laterality: N/A;   ERCP N/A 11/05/2021   Procedure: ENDOSCOPIC RETROGRADE CHOLANGIOPANCREATOGRAPHY (ERCP);  Surgeon: [name], MD;  Location: [name];  Service: Gastroenterology;  Laterality: N/A;   ERCP N/A 06/20/2023   Procedure: ERCP, WITH INTERVENTION IF INDICATED;  Surgeon: Saintclair Jasper, MD;  Location: [name];  Service: Gastroenterology;  Laterality: N/A;   ESOPHAGOGASTRODUODENOSCOPY (EGD) WITH PROPOFOL  N/A 07/13/2021   Procedure: ESOPHAGOGASTRODUODENOSCOPY (EGD) WITH PROPOFOL ;  Surgeon: [name]., MD;  Location: [name];  Service: Gastroenterology;  Laterality: N/A;   EUS N/A 07/13/2021   Procedure: UPPER ENDOSCOPIC ULTRASOUND (EUS) LINEAR;  Surgeon: [name]., MD;  Location: [name];  Service: Gastroenterology;  Laterality: N/A;   FINE NEEDLE ASPIRATION  07/13/2021   Procedure: FINE NEEDLE ASPIRATION (FNA) LINEAR;  Surgeon: [name]., MD;  Location: [name];  Service: Gastroenterology;;   IR IMAGING GUIDED PORT INSERTION  07/31/2021   SPHINCTEROTOMY  07/13/2021   Procedure: ANNETT;  Surgeon: [name], MD;  Location: [name];  Service: Gastroenterology;;    SOCIAL HISTORY: Social History   Socioeconomic History   Marital status: Single    Spouse name: Not on file   Number of children: 0   Years of education: Not on file   Highest education level: Some  college, no degree  Occupational  History   Not on file  Tobacco Use   Smoking status: Some Days    Types: Cigarettes   Smokeless tobacco: Never  Vaping Use   Vaping status: Never Used  Substance and Sexual Activity   Alcohol use: Yes    Comment: social   Drug use: No   Sexual activity: Never  Other Topics Concern   Not on file  Social History Narrative   Not on file   Social Drivers of Health   Financial Resource Strain: Low Risk  (04/14/2017)   Overall Financial Resource Strain (CARDIA)    Difficulty of Paying Living Expenses: Not hard at all  Food Insecurity: No Food Insecurity (06/18/2023)   Hunger Vital Sign    Worried About Running Out of Food in the Last Year: Never true    Ran Out of Food in the Last Year: Never true  Transportation Needs: No Transportation Needs (06/18/2023)   PRAPARE - Administrator, Civil Service (Medical): No    Lack of Transportation (Non-Medical): No  Physical Activity: Inactive (04/14/2017)   Exercise Vital Sign    Days of Exercise per Week: 0 days    Minutes of Exercise per Session: 0 min  Stress: Stress Concern Present (04/14/2017)   [name] of Occupational Health - Occupational Stress Questionnaire    Feeling of Stress : Rather much  Social Connections: Unknown (06/19/2021)   Received from [facility]   Social Network    Social Network: Not on file  Intimate Partner Violence: Not At Risk (06/18/2023)   Humiliation, Afraid, Rape, and Kick questionnaire    Fear of Current or Ex-Partner: No    Emotionally Abused: No    Physically Abused: No    Sexually Abused: No    FAMILY HISTORY: Family History  Problem Relation Age of Onset   Drug abuse Father    ALLERGIES:  is allergic to shellfish allergy.  MEDICATIONS:  Current Outpatient Medications  Medication Sig Dispense Refill   acetaminophen  (TYLENOL ) 500 MG tablet Take 1,000 mg by mouth every 6 (six) hours as needed for mild pain.     albuterol  (VENTOLIN  HFA) 108 (90 Base) MCG/ACT inhaler Inhale  1-2 puffs into the lungs every 6 (six) hours as needed for wheezing or shortness of breath. 18 g 0   methocarbamol  (ROBAXIN ) 500 MG tablet Take 1 tablet (500 mg total) by mouth 2 (two) times daily. (Patient taking differently: Take 500 mg by mouth 2 (two) times daily as needed for muscle spasms.) 20 tablet 0   ondansetron  (ZOFRAN ) 8 MG tablet Take 1 tablet (8 mg total) by mouth every 8 (eight) hours as needed for nausea, vomiting or refractory nausea / vomiting. Start on the third day after chemotherapy. 30 tablet 1   No current facility-administered medications for this visit.    REVIEW OF SYSTEMS:    10 Point review of Systems was done is negative except as noted above.  PHYSICAL EXAMINATION: ECOG PERFORMANCE STATUS: 1 - Symptomatic but completely ambulatory Vitals:   10/22/23 1027  BP: (!) 128/90  Pulse: 88  Resp: 18  Temp: 97.6 F (36.4 C)  SpO2: 100%    GENERAL:alert, in no acute distress and comfortable SKIN: no acute rashes, no significant lesions EYES: conjunctiva are pink and non-injected, sclera anicteric NECK: supple, no JVD LUNGS: clear to auscultation b/l with normal respiratory effort HEART: regular rate & rhythm Extremity: no pedal edema PSYCH: alert & oriented x 3 with  fluent speech NEURO: no focal motor/sensory deficits  LABORATORY DATA:  I have reviewed the data as listed  .    Latest Ref Rng & Units 10/22/2023    9:47 AM 10/07/2023   11:37 AM 09/23/2023   10:52 AM  CBC  WBC 4.0 - 10.5 K/uL 3.2  3.5  2.5   Hemoglobin 13.0 - 17.0 g/dL 88.2  88.1  89.7   Hematocrit 39.0 - 52.0 % 35.9  34.7  31.4   Platelets 150 - 400 K/uL 144  134  125     .    Latest Ref Rng & Units 10/22/2023    9:47 AM 10/07/2023   11:37 AM 09/23/2023   10:52 AM  CMP  Glucose 70 - 99 mg/dL 854  93  867   BUN 6 - 20 mg/dL 11  13  7    Creatinine 0.61 - 1.24 mg/dL 9.18  9.23  9.26   Sodium 135 - 145 mmol/L 140  139  141   Potassium 3.5 - 5.1 mmol/L 3.9  3.7  3.6   Chloride 98 - 111  mmol/L 107  106  110   CO2 22 - 32 mmol/L 27  26  26    Calcium  8.9 - 10.3 mg/dL 9.2  9.3  8.6   Total Protein 6.5 - 8.1 g/dL 7.6  7.5  6.7   Total Bilirubin 0.0 - 1.2 mg/dL 0.8  1.0  0.5   Alkaline Phos 38 - 126 U/L 100  95  99   AST 15 - 41 U/L 17  18  22    ALT 0 - 44 U/L 17  15  27     Cytology done 07/13/2021 revealed FINAL MICROSCOPIC DIAGNOSIS:  A. PANCREAS, HEAD, FINE NEEDLE ASPIRATION:  - Malignant cells present  - Poorly differentiated/high-grade neuroendocrine carcinoma (see  comment)   B. CBD STRICTURE, BRUSHING:  - Atypical cells suspicious for tumor   C. BILIARY DILATION, BALLOON, REMOVAL:  - Atypical cells suspicious for tumor   Surgical pathology done 07/13/2021 revealed FINAL MICROSCOPIC DIAGNOSIS:   A. STOMACH, BIOPSY:  Reactive gastropathy and minimal chronic gastritis with lymphoid  aggregate  Negative for H. pylori, intestinal metaplasia, dysplasia and carcinoma  RADIOGRAPHIC STUDIES: I have personally reviewed the radiological images as listed and agreed with the findings in the report. CT CHEST ABDOMEN PELVIS W CONTRAST Result Date: 10/11/2023 CLINICAL DATA:  Metastatic high-grade neuroendocrine tumor, assess treatment response. * Tracking Code: BO * EXAM: CT CHEST, ABDOMEN, AND PELVIS WITH CONTRAST TECHNIQUE: Multidetector CT imaging of the chest, abdomen and pelvis was performed following the standard protocol during bolus administration of intravenous contrast. RADIATION DOSE REDUCTION: This exam was performed according to the departmental dose-optimization program which includes automated exposure control, adjustment of the mA and/or kV according to patient size and/or use of iterative reconstruction technique. CONTRAST:  OMNIPAQUE  IOHEXOL  300 MG/ML  SOLN COMPARISON:  Multiple priors including MRI Jun 19, 2023 and CT May 14 25 and CT Jun 05, 2023. FINDINGS: CT CHEST FINDINGS Cardiovascular: Right chest Port-A-Cath with tip near the superior cavoatrial  junction. Normal caliber thoracic aorta. Normal size heart. No significant pericardial effusion/thickening. Mediastinum/Nodes: No suspicious thyroid nodule. No pathologically enlarged mediastinal, hilar or axillary lymph nodes. The esophagus is grossly unremarkable. Lungs/Pleura: Stable partially calcified 3 mm nodule in the anterior right upper lobe on image 35/4. No new suspicious pulmonary nodules or masses. No pleural effusion. No pneumothorax. Musculoskeletal: No aggressive lytic or blastic lesion of bone. CT ABDOMEN PELVIS FINDINGS  Hepatobiliary: Bilobar hepatic metastasis better seen on MRI. For reference: -lateral left lobe of the liver lesion measuring 13 mm on image 40/2 previously measured 15 mm -lesion in the peripheral right lobe of the liver measuring 11 mm on image 42/2 previously measured 7 mm. -lesion in the caudate lobe measures 14 mm on image 42/2 previously 7 mm. Gallbladder surgically absent. Metallic common bile duct stent in place. Pneumobilia compatible with stent patency. Pancreas: Masslike area in the pancreatic head difficult to definitively measure but measures proximally 5.2 cm on image 60/2 previously 4.8 cm when remeasured for consistency. There is similar upstream pancreatic ductal dilation and parenchymal atrophy. Spleen: Similar splenomegaly measuring 16.4 cm in maximum axial dimension. Adrenals/Urinary Tract: No suspicious adrenal nodule/mass. No hydronephrosis. Kidneys demonstrate symmetric enhancement. Mild symmetric wall thickening of a nondistended urinary bladder. Stomach/Bowel: Stomach is unremarkable for degree of distension. No pathologic dilation of small or large bowel. No evidence of acute bowel inflammation. Vascular/Lymphatic: Normal caliber abdominal aorta. Smooth IVC contours. Prominent retroperitoneal and mesenteric lymph nodes are similar prior. For reference: -retrocaval lymph node measures 10 mm in short axis on image 65/2 previously 11 mm. Reproductive: Prostate  is unremarkable. Other: Trace pelvic free fluid with similar lobular peritoneal thickening for instance on image 91/5. Musculoskeletal: No aggressive lytic or blastic lesion of bone. IMPRESSION: 1. Masslike area in the pancreatic head is difficult to definitively measure but measures proximally 5.2 cm previously 4.8 cm when remeasured for consistency. 2. Bilobar hepatic metastasis are better seen on MRI but appear slightly increased in size from prior. 3. Similar splenomegaly. 4. Trace pelvic free fluid with similar lobular peritoneal thickening. 5. Stable prominent retroperitoneal and mesenteric lymph nodes. 6. Stable partially calcified 3 mm nodule in the anterior right upper lobe. No new suspicious pulmonary nodules or masses. Electronically Signed   By: Reyes Holder M.D.   On: 10/11/2023 04:40     ASSESSMENT & PLAN:  Leon Fischer. Is a 33 y.o. male who presents for a follow up for high grade neuroendocrine carcinoma.   1. 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 -Cytology done 07/13/2021 shows presence of malignant cells in pancreas and poorly differentiated/high grade neuroendocrine carcinoma. -Received 6 cycles of first line chemotherapy with carboplatin  and etoposide  from 08/01/2021-12/26/2021.Treatment was discontinued after CT CAP from 04/04/2022 showed progression of disease. -Received 6 cycles of second line chemotherapy with FOLFOX from 04/24/2022-07/17/2022. Oxaliplatin  discontinued with 6th cycle due to elevated LFTs. -Started third line chemotherapy with FOLFIRI on 07/31/2022 -Most recent CT CAP from 06/18/2023 showed overall stable disease.  Plan -Due for Cycle 27, Day 1 of FOLFIRI today -Labs from today were reviewed with patient. WBC 3.2, Hgb 11.7, Plt 144K. Creatinine and LFTs are normal --Reviewed CT CAP from 09/26/2023 that shows slight progression of pancreatic head mass and liver metastases.  --Discussed options  offered by Dr. Onesimo including changing therapies to Capecitabine and temozolomide, repeat liver biopsy to send for NGS testing for possible targetable mutation, referral to tertiary center for evaluation for clinical trials.  --Provided patient handouts for capecitabine and temozolomide regimen.  --Plan to receive FOLFIRI today and then follow up with Dr. Onesimo next week with a telephone visit to finalize treatment options.   #Elevated LFTs: --Secondary to biliary stent occlusion s/p ERCP with replacement of CBD stent on 06/20/2023 --LFTs are in normal range today.   #Nausea:  --Well controlled with prescribed antiemetics. Refill of compazine  sent today.  All of the patient's questions were answered with apparent satisfaction. The patient knows to call the clinic with any problems, questions or concerns.  I have spent a total of 30 minutes minutes of face-to-face and non-face-to-face time, preparing to see the patient, , performing a medically appropriate examination, counseling and educating the patient,documenting clinical information in the electronic health record, and care coordination.   Johnston Police PA-C Dept of Hematology and Oncology [facility] Phone: [phone]

## 2023-10-30 ENCOUNTER — Inpatient Hospital Stay (HOSPITAL_BASED_OUTPATIENT_CLINIC_OR_DEPARTMENT_OTHER): Admitting: Hematology

## 2023-10-30 DIAGNOSIS — C7A1 Malignant poorly differentiated neuroendocrine tumors: Secondary | ICD-10-CM

## 2023-10-30 DIAGNOSIS — Z5111 Encounter for antineoplastic chemotherapy: Secondary | ICD-10-CM | POA: Diagnosis not present

## 2023-10-30 NOTE — Progress Notes (Signed)
 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  04/24/2022-07/17/2022: Received 6 cycles of FOLFOX, oxaliplatin  discontinued with 6th cycle due to elevated LFTs.    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)   08/01/2021 - 12/26/2021 Chemotherapy   Patient is on Treatment Plan : NEUROENDOCRINE PANCREATIC, GI Carboplatin  D1 + Etoposide  D1-3 q21d     04/24/2022 - 07/19/2022 Chemotherapy   Patient is on Treatment Plan : PANCREAS FOLFOX q14d     07/31/2022 -  Chemotherapy   Patient is on Treatment Plan : PANCREAS FOLFIRI q14d     12/24/2022 Cancer Staging   Staging form: Lung, AJCC 8th Edition - Clinical: Stage IVB (pM1c) - Signed by [name], MD on 12/24/2022     INTERVAL HISTORY:  I connected with  Leon Ozell Leonce Mickey., accompanied by his wife, on 10/30/23 by telephone and verified that I am speaking with the correct person using two identifiers.  Patient Location: home Provider Location: office Other people(s) involved in this visit: medical scribe, Damien Blanks, and patient's mother.   I discussed the limitations of evaluation and management by telemedicine. The patient expressed understanding and agreed to proceed.  He was seen 9/17 by DEVONNA Lis in my stead. Today we are reviewing his most recent CT scan.  His Folfox was d/c due to concerns it was contributing to his elevated liver enzymes. He denies new symptoms, or side effects of diarrhea while taking Folfiri.  His recent CT scan shows progression of his pancreatic mass and liver lesions. While discussing possible treatment alternatives, his mother expresses concerns about the side effects (fatigue, anemia, risk of infections) on his work capacity.  REVIEW OF SYSTEMS:    10 Point review of systems of done and is negative except as noted above.  . Past Medical History:  Diagnosis Date   Asthma    Neuroendocrine cancer (HCC)     . Past Surgical History:  Procedure Laterality Date   BILIARY DILATION  07/13/2021   Procedure: BILIARY DILATION;  Surgeon: [name], MD;  Location: [name];  Service: Gastroenterology;;   BILIARY STENT PLACEMENT N/A 07/13/2021   Procedure: BILIARY STENT PLACEMENT;  Surgeon: [name], MD;  [name];  Service: Gastroenterology;  Laterality: N/A;   BILIARY STENT PLACEMENT N/A 11/05/2021   Procedure: BILIARY STENT PLACEMENT;  Surgeon: [name], MD;  [name];  Service: Gastroenterology;  Laterality: N/A;   BIOPSY  07/13/2021   Procedure: BIOPSY;  Surgeon: Wilhelmenia Aloha Mickey., MD;  Location: [name];  Service: Gastroenterology;;   CHOLECYSTECTOMY N/A 11/07/2021   Procedure: LAPAROSCOPIC CHOLECYSTECTOMY;  Surgeon: [name], MD;  [facility];  Service: General;  Laterality: N/A;   ENDOSCOPIC RETROGRADE CHOLANGIOPANCREATOGRAPHY (ERCP) WITH PROPOFOL  N/A 07/13/2021   Procedure: ENDOSCOPIC RETROGRADE CHOLANGIOPANCREATOGRAPHY (ERCP) WITH PROPOFOL ;  Surgeon: [name], MD;  [name];  Service: Gastroenterology;  Laterality: N/A;   ERCP N/A 11/05/2021   Procedure: ENDOSCOPIC RETROGRADE CHOLANGIOPANCREATOGRAPHY (ERCP);  Surgeon: [name], MD;  Location: [facility];  Service: Gastroenterology;  Laterality: N/A;   ERCP N/A 06/20/2023   Procedure: ERCP,  WITH INTERVENTION IF INDICATED;  Surgeon: Saintclair Jasper, MD;  [name];  Service: Gastroenterology;  Laterality: N/A;   ESOPHAGOGASTRODUODENOSCOPY (EGD) WITH PROPOFOL  N/A 07/13/2021   Procedure: ESOPHAGOGASTRODUODENOSCOPY (EGD) WITH PROPOFOL ;  Surgeon: Wilhelmenia Aloha Raddle., MD;  [name];  Service: Gastroenterology;  Laterality: N/A;   EUS N/A 07/13/2021   Procedure: UPPER ENDOSCOPIC ULTRASOUND (EUS) LINEAR;  Surgeon: Wilhelmenia Aloha Raddle., MD;  [name];  Service: Gastroenterology;  Laterality: N/A;   FINE NEEDLE ASPIRATION  07/13/2021   Procedure: FINE NEEDLE ASPIRATION (FNA) LINEAR;  Surgeon: Wilhelmenia Aloha Raddle., MD;  Location: [name];  Service: Gastroenterology;;   IR IMAGING GUIDED PORT INSERTION  07/31/2021   SPHINCTEROTOMY  07/13/2021   Procedure: ANNETT;  Surgeon: [name], MD;  [name];  Service: Gastroenterology;;    . Social History   Tobacco Use   Smoking status: Some Days    Types: Cigarettes   Smokeless tobacco: Never  Vaping Use   Vaping status: Never Used  Substance Use Topics   Alcohol use: Yes    Comment: social   Drug use: No    ALLERGIES:  is allergic to shellfish allergy.  MEDICATIONS:  Current Outpatient Medications  Medication Sig Dispense Refill   acetaminophen  (TYLENOL ) 500 MG tablet Take 1,000 mg by mouth every 6 (six) hours as needed for mild pain.     albuterol  (VENTOLIN  HFA) 108 (90 Base) MCG/ACT inhaler Inhale 1-2 puffs into the lungs every 6 (six) hours as needed for wheezing or shortness of breath. 18 g 0   methocarbamol  (ROBAXIN ) 500 MG tablet Take 1 tablet (500 mg total) by mouth 2 (two) times daily. (Patient taking differently: Take 500 mg by mouth 2 (two) times daily as needed for muscle spasms.) 20 tablet 0   ondansetron  (ZOFRAN ) 8 MG tablet Take 1 tablet (8 mg total) by mouth every 8 (eight) hours as needed for nausea, vomiting or refractory nausea / vomiting. Start on the  third day after chemotherapy. 30 tablet 1   prochlorperazine  (COMPAZINE ) 10 MG tablet Take 1 tablet (10 mg total) by mouth every 6 (six) hours as needed for nausea or vomiting. 90 tablet 0   No current facility-administered medications for this visit.    PHYSICAL EXAMINATION TELEPHONE VISIT: Telemedicine visit  LABORATORY DATA:   I have reviewed the data as listed  .    Latest Ref Rng & Units 10/22/2023    9:47 AM 10/07/2023   11:37 AM 09/23/2023   10:52 AM  CBC  WBC 4.0 - 10.5 K/uL 3.2  3.5  2.5   Hemoglobin 13.0 - 17.0 g/dL 88.2  88.1  89.7   Hematocrit 39.0 - 52.0 % 35.9  34.7  31.4   Platelets 150 - 400 K/uL 144  134  125        Latest Ref Rng & Units 10/22/2023    9:47 AM 10/07/2023   11:37 AM 09/23/2023   10:52 AM  CMP  Glucose 70 - 99 mg/dL 854  93  867   BUN 6 - 20 mg/dL 11  13  7    Creatinine 0.61 - 1.24 mg/dL 9.18  9.23  9.26   Sodium 135 - 145 mmol/L 140  139  141   Potassium 3.5 - 5.1 mmol/L 3.9  3.7  3.6   Chloride 98 - 111 mmol/L 107  106  110   CO2 22 -  32 mmol/L 27  26  26    Calcium  8.9 - 10.3 mg/dL 9.2  9.3  8.6   Total Protein 6.5 - 8.1 g/dL 7.6  7.5  6.7   Total Bilirubin 0.0 - 1.2 mg/dL 0.8  1.0  0.5   Alkaline Phos 38 - 126 U/L 100  95  99   AST 15 - 41 U/L 17  18  22    ALT 0 - 44 U/L 17  15  27     Cytology done 07/13/2021 revealed FINAL MICROSCOPIC DIAGNOSIS:  A. PANCREAS, HEAD, FINE NEEDLE ASPIRATION:  - Malignant cells present  - Poorly differentiated/high-grade neuroendocrine carcinoma (see  comment)    B. CBD STRICTURE, BRUSHING:  - Atypical cells suspicious for tumor   C. BILIARY DILATION, BALLOON, REMOVAL:  - Atypical cells suspicious for tumor    Surgical pathology done 07/13/2021 revealed FINAL MICROSCOPIC DIAGNOSIS:   A. STOMACH, BIOPSY:  Reactive gastropathy and minimal chronic gastritis with lymphoid  aggregate  Negative for H. pylori, intestinal metaplasia, dysplasia and carcinoma   RADIOGRAPHIC STUDIES: I have personally  reviewed the radiological images as listed and agreed with the findings in the report.   ASSESSMENT & PLAN:  Leon Fischer. Is a 33 y.o. male who presents for a follow up for high grade neuroendocrine carcinoma.    1. 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 -Cytology done 07/13/2021 shows presence of malignant cells in pancreas and poorly differentiated/high grade neuroendocrine carcinoma. -Received 6 cycles of first line chemotherapy with carboplatin  and etoposide  from 08/01/2021-12/26/2021.Treatment was discontinued after CT CAP from 04/04/2022 showed progression of disease. -Received 6 cycles of second line chemotherapy with FOLFOX from 04/24/2022-07/17/2022. Oxaliplatin  discontinued with 6th cycle due to elevated LFTs. -Started third line chemotherapy with FOLFIRI on 07/31/2022 -Most recent CT CAP from 06/18/2023 showed overall stable disease.    #hx of Elevated LFTs: --Secondary to biliary stent occlusion s/p ERCP with replacement of CBD stent on 06/20/2023 - Dr. Saintclair is his gastroenterologist   PLAN:  - Reviewed CT Chest/Abd/Pelvis done 09/26/2023, which showed marginal changes to primary mass in pancreas 4.8 cm > 5.2 cm, liver lesions increased by ~0.25 - 0.5 inches, abdominal lymph nodes stable, no new areas.  - Discussed alternative treatment options or biopsy, referral to [facility]  - Reviewed labs done on 9/17: CBC stable with improvement in hemoglobin 11.7, WBC count 3.2k, platelets of 144k. CMP within normal limits - Patient has no acute new symptoms. -Patient wants to continue FOLFIRI for the next cycle think about other treatment options.  We discussed that if his labs are stable we will try to increase his irinotecan  to 150 mg/m. - Options discussed were capecitabine plus temozolomide. - We discussed that with his poorly differentiated high-grade tumor and the chance of being octreotide receptor positive is low.  We had  tried to get a PET dotatate study but this was refused on multiple occasions repeatedly by his insurance company. - He is agreeable with referral to [facility] which has been placed evaluate for other treatment options and clinical trials.  FOLLOW-UP We shall see him in person with his next FOLFIRI dose with port flush and labs  The total time spent in the appointment was 20 minutes*.  All of the patient's questions were answered with apparent satisfaction. The patient knows to call the clinic with any problems, questions or concerns.   Emaline Saran MD MS AAHIVMS [facility] Hematology/Oncology Physician [facility]  .*  Total Encounter Time as defined by the Centers for Medicare and Medicaid Services includes, in addition to the face-to-face time of a patient visit (documented in the note above) non-face-to-face time: obtaining and reviewing outside history, ordering and reviewing medications, tests or procedures, care coordination (communications with other health care professionals or caregivers) and documentation in the medical record.   I,Emily Lagle,acting as a Neurosurgeon for Emaline Saran, MD.,have documented all relevant documentation on the behalf of Emaline Saran, MD,as directed by  Emaline Saran, MD while in the presence of Emaline Saran, MD.

## 2023-11-04 ENCOUNTER — Inpatient Hospital Stay (HOSPITAL_BASED_OUTPATIENT_CLINIC_OR_DEPARTMENT_OTHER): Admitting: Hematology

## 2023-11-04 ENCOUNTER — Inpatient Hospital Stay

## 2023-11-04 VITALS — BP 110/80 | HR 103 | Temp 97.3°F | Resp 20 | Ht 77.0 in | Wt 311.8 lb

## 2023-11-04 DIAGNOSIS — C7A1 Malignant poorly differentiated neuroendocrine tumors: Secondary | ICD-10-CM

## 2023-11-04 DIAGNOSIS — Z7189 Other specified counseling: Secondary | ICD-10-CM | POA: Diagnosis not present

## 2023-11-04 DIAGNOSIS — Z5111 Encounter for antineoplastic chemotherapy: Secondary | ICD-10-CM | POA: Diagnosis not present

## 2023-11-04 LAB — CBC WITH DIFFERENTIAL (CANCER CENTER ONLY)
Abs Immature Granulocytes: 0.01 K/uL (ref 0.00–0.07)
Basophils Absolute: 0 K/uL (ref 0.0–0.1)
Basophils Relative: 0 %
Eosinophils Absolute: 0.2 K/uL (ref 0.0–0.5)
Eosinophils Relative: 4 %
HCT: 33.7 % — ABNORMAL LOW (ref 39.0–52.0)
Hemoglobin: 11.1 g/dL — ABNORMAL LOW (ref 13.0–17.0)
Immature Granulocytes: 0 %
Lymphocytes Relative: 11 %
Lymphs Abs: 0.5 K/uL — ABNORMAL LOW (ref 0.7–4.0)
MCH: 28.2 pg (ref 26.0–34.0)
MCHC: 32.9 g/dL (ref 30.0–36.0)
MCV: 85.5 fL (ref 80.0–100.0)
Monocytes Absolute: 0.3 K/uL (ref 0.1–1.0)
Monocytes Relative: 8 %
Neutro Abs: 3.5 K/uL (ref 1.7–7.7)
Neutrophils Relative %: 77 %
Platelet Count: 143 K/uL — ABNORMAL LOW (ref 150–400)
RBC: 3.94 MIL/uL — ABNORMAL LOW (ref 4.22–5.81)
RDW: 15.9 % — ABNORMAL HIGH (ref 11.5–15.5)
WBC Count: 4.5 K/uL (ref 4.0–10.5)
nRBC: 0 % (ref 0.0–0.2)

## 2023-11-04 LAB — CMP (CANCER CENTER ONLY)
ALT: 101 U/L — ABNORMAL HIGH (ref 0–44)
AST: 86 U/L — ABNORMAL HIGH (ref 15–41)
Albumin: 4.1 g/dL (ref 3.5–5.0)
Alkaline Phosphatase: 123 U/L (ref 38–126)
Anion gap: 6 (ref 5–15)
BUN: 13 mg/dL (ref 6–20)
CO2: 26 mmol/L (ref 22–32)
Calcium: 9 mg/dL (ref 8.9–10.3)
Chloride: 106 mmol/L (ref 98–111)
Creatinine: 0.88 mg/dL (ref 0.61–1.24)
GFR, Estimated: >60 mL/min (ref >60)
Glucose, Bld: 161 mg/dL — ABNORMAL HIGH (ref 70–99)
Potassium: 3.6 mmol/L (ref 3.5–5.1)
Sodium: 138 mmol/L (ref 135–145)
Total Bilirubin: 1 mg/dL (ref 0.0–1.2)
Total Protein: 7.3 g/dL (ref 6.5–8.1)

## 2023-11-04 NOTE — Progress Notes (Signed)
 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  04/24/2022-07/17/2022: Received 6 cycles of FOLFOX, oxaliplatin  discontinued with 6th cycle due to elevated LFTs.    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 is a 33 y.o. 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  On 10/30/2023 we connected via telephone for a visit, where we discussed the changes seen on his CT CAP scan. He denied new symptoms or side effects of diarrhea while taking Folfiri. He was to get FOLFIRI today with potentially increasing dose of irinotecan  however due to bump in his liver function tests we had to hold his FOLFIRI today.  We discussed treatment options in detail  again and he is okay with trying to proceed with capecitabine plus temozolomide.  He is also agreeable for referral to [facility] for consideration of clinical trials and other treatment options.  REVIEW OF SYSTEMS:    10 Point review of Systems was done is negative except as noted above.  . Past Medical History:  Diagnosis Date   Asthma    Neuroendocrine cancer (HCC)     . Past Surgical History:  Procedure Laterality Date   BILIARY DILATION  07/13/2021   Procedure: BILIARY DILATION;  Surgeon: [name], MD;  Location: [name];  Service: Gastroenterology;;   BILIARY STENT PLACEMENT N/A 07/13/2021   Procedure: BILIARY STENT PLACEMENT;  Surgeon: [name], MD;  [name];  Service: Gastroenterology;  Laterality: N/A;   BILIARY STENT PLACEMENT N/A 11/05/2021   Procedure: BILIARY STENT PLACEMENT;  Surgeon: [name], MD;  [name];  Service: Gastroenterology;  Laterality: N/A;   BIOPSY  07/13/2021   Procedure: BIOPSY;  Surgeon: Wilhelmenia Aloha Mickey., MD;  Location: [name];  Service: Gastroenterology;;   CHOLECYSTECTOMY N/A 11/07/2021   Procedure: LAPAROSCOPIC CHOLECYSTECTOMY;  Surgeon: [name], MD;  Location: [facility];  Service: General;  Laterality: N/A;   ENDOSCOPIC RETROGRADE CHOLANGIOPANCREATOGRAPHY (ERCP) WITH PROPOFOL  N/A 07/13/2021   Procedure: ENDOSCOPIC RETROGRADE CHOLANGIOPANCREATOGRAPHY (ERCP) WITH PROPOFOL ;  Surgeon: [name], MD;  [name];  Service: Gastroenterology;  Laterality: N/A;   ERCP N/A 11/05/2021   Procedure: ENDOSCOPIC RETROGRADE CHOLANGIOPANCREATOGRAPHY (ERCP);  Surgeon: [name], MD;  Location: [name];  Service: Gastroenterology;  Laterality: N/A;   ERCP N/A 06/20/2023   Procedure: ERCP, WITH INTERVENTION IF INDICATED;  Surgeon: Saintclair Jasper, MD;  [name];  Service: Gastroenterology;  Laterality: N/A;   ESOPHAGOGASTRODUODENOSCOPY (EGD) WITH PROPOFOL  N/A 07/13/2021   Procedure:  ESOPHAGOGASTRODUODENOSCOPY (EGD) WITH PROPOFOL ;  Surgeon: Wilhelmenia Aloha Raddle., MD;  [name];  Service: Gastroenterology;  Laterality: N/A;   EUS N/A 07/13/2021   Procedure: UPPER ENDOSCOPIC ULTRASOUND (EUS) LINEAR;  Surgeon: Wilhelmenia Aloha Raddle., MD;  Location: [name];  Service: Gastroenterology;  Laterality: N/A;   FINE NEEDLE ASPIRATION  07/13/2021   Procedure: FINE NEEDLE ASPIRATION (FNA) LINEAR;  Surgeon: Wilhelmenia Aloha Raddle., MD;  Location: [name];  Service: Gastroenterology;;   IR IMAGING GUIDED PORT INSERTION  07/31/2021   SPHINCTEROTOMY  07/13/2021   Procedure: ANNETT;  Surgeon: [name], MD;  Location: [name];  Service: Gastroenterology;;    . Social History   Tobacco Use   Smoking status: Some Days    Types: Cigarettes   Smokeless tobacco: Never  Vaping Use   Vaping status: Never Used  Substance Use Topics   Alcohol use: Yes    Comment: social   Drug use: No    ALLERGIES:  is allergic to shellfish allergy.  MEDICATIONS:  Current Outpatient Medications  Medication Sig Dispense Refill   acetaminophen  (TYLENOL ) 500 MG tablet Take 1,000 mg by mouth every 6 (six) hours as needed for mild pain.     albuterol  (VENTOLIN  HFA) 108 (90 Base) MCG/ACT inhaler Inhale 1-2 puffs into the lungs every 6 (six) hours as needed for wheezing or shortness of breath. 18 g 0   methocarbamol  (ROBAXIN ) 500 MG tablet Take 1 tablet (500 mg total) by mouth 2 (two) times daily. (Patient taking differently: Take 500 mg by mouth 2 (two) times daily as needed for muscle spasms.) 20 tablet 0   ondansetron  (ZOFRAN ) 8 MG tablet Take 1 tablet (8 mg total) by mouth every 8 (eight) hours as needed for nausea, vomiting or refractory nausea / vomiting. Start on the third day after chemotherapy. 30 tablet 1   prochlorperazine  (COMPAZINE ) 10 MG tablet Take 1 tablet (10 mg total) by mouth every 6 (six) hours as needed for nausea or vomiting. 90 tablet 0   No current  facility-administered medications for this visit.    PHYSICAL EXAMINATION: ECOG PERFORMANCE STATUS: 1 - Symptomatic but completely ambulatory  . Vitals:   11/04/23 1003  BP: 110/80  Pulse: (!) 103  Resp: 20  Temp: (!) 97.3 F (36.3 C)  SpO2: 99%    Filed Weights   11/04/23 1003  Weight: (!) 311 lb 12.8 oz (141.4 kg)   .Body mass index is 42.29 kg/m.  GENERAL:alert, in no acute distress and comfortable SKIN: no acute rashes, no significant lesions EYES: conjunctiva are pink and non-injected, sclera anicteric OROPHARYNX: MMM, no exudates, no oropharyngeal erythema or ulceration NECK: supple, no JVD LYMPH:  no palpable lymphadenopathy in the cervical, axillary or inguinal regions LUNGS: clear to auscultation b/l with normal respiratory effort HEART: regular rate & rhythm ABDOMEN:  normoactive bowel sounds , non tender, not distended. Extremity: no pedal edema PSYCH: alert & oriented x 3 with fluent speech NEURO: no focal motor/sensory deficits  LABORATORY DATA:   I have reviewed the data as listed  .    Latest Ref Rng & Units 10/22/2023    9:47 AM 10/07/2023   11:37 AM 09/23/2023   10:52 AM  CBC  WBC 4.0 - 10.5 K/uL 3.2  3.5  2.5   Hemoglobin 13.0 - 17.0 g/dL 88.2  88.1  89.7   Hematocrit 39.0 - 52.0 % 35.9  34.7  31.4  Platelets 150 - 400 K/uL 144  134  125     .    Latest Ref Rng & Units 10/22/2023    9:47 AM 10/07/2023   11:37 AM 09/23/2023   10:52 AM  CMP  Glucose 70 - 99 mg/dL 854  93  867   BUN 6 - 20 mg/dL 11  13  7    Creatinine 0.61 - 1.24 mg/dL 9.18  9.23  9.26   Sodium 135 - 145 mmol/L 140  139  141   Potassium 3.5 - 5.1 mmol/L 3.9  3.7  3.6   Chloride 98 - 111 mmol/L 107  106  110   CO2 22 - 32 mmol/L 27  26  26    Calcium  8.9 - 10.3 mg/dL 9.2  9.3  8.6   Total Protein 6.5 - 8.1 g/dL 7.6  7.5  6.7   Total Bilirubin 0.0 - 1.2 mg/dL 0.8  1.0  0.5   Alkaline Phos 38 - 126 U/L 100  95  99   AST 15 - 41 U/L 17  18  22    ALT 0 - 44 U/L 17  15  27         RADIOGRAPHIC STUDIES: I have personally reviewed the radiological images as listed and agreed with the findings in the report. No results found.  ASSESSMENT & PLAN:   Leon Fischer. Is a 33 y.o. male who presents for a follow up for high grade neuroendocrine carcinoma.    1. 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 -Cytology done 07/13/2021 shows presence of malignant cells in pancreas and poorly differentiated/high grade neuroendocrine carcinoma. [employer] did not show any targetable mutations. -Received 6 cycles of first line chemotherapy with carboplatin  and etoposide  from 08/01/2021-12/26/2021.Treatment was discontinued after CT CAP from 04/04/2022 showed progression of disease. -Received 6 cycles of second line chemotherapy with FOLFOX from 04/24/2022-07/17/2022. Oxaliplatin  discontinued with 6th cycle due to elevated LFTs. -Started third line chemotherapy with FOLFIRI on 07/31/2022 till 10/2023 -Most recent CT CAP from 06/18/2023 showed overall stable disease.  -  CT CAPscan on 09/26/2023, showed progression with primary mass in pancreas increased in size 4.8 cm > 5.2 cm, liver lesions increased by ~0.25 - 0.5 inches, abdominal lymph nodes stable, no new areas  PLAN:  Labs done today were discussed in detail with the patient he was accompanied by his mother. CBC shows stable hemoglobin of 11.1 WBC count of 4.5 and platelets of 143k CMP shows bump in AST to 86 and ALT 201 other electrolytes and renal function within normal limits I discussed that we cannot continue FOLFIRI due to progression and also cannot use higher dose Evenity can due to bump in liver functions which could be from chemotherapy but more likely from disease progression in the liver. - We discussed alternative treatment options patient is agreeable with proceeding with capecitabine plus temozolomide. - He is also agreeable with being referred again to  [facility] for consideration of clinical trials and other treatment options. - Previous attempt to get a PET dotatate study were declined by insurance. - Orders for capecitabine and temozolomide have been sent to the baseline outpatient pharmacy - We will see him back in 3 to 4 weeks for labs and a toxicity check - He will also be on Bactrim prophylaxis 1 double send tablet 3 days a week and acyclovir and we will send in Zofran  as needed for nausea  Follow-up -Discontinue FOLFIRI Switching to capecitabine plus temozolomide  Return to clinic with Dr. Onesimo with port flush labs in 3 to 4 weeks Referral to [facility] for second opinion   The total time spent in the appointment was 30 minutes*.  All of the patient's questions were answered with apparent satisfaction. The patient knows to call the clinic with any problems, questions or concerns.   Emaline Onesimo MD MS AAHIVMS [facility] Hematology/Oncology Physician [facility]  .*Total Encounter Time as defined by the Centers for Medicare and Medicaid Services includes, in addition to the face-to-face time of a patient visit (documented in the note above) non-face-to-face time: obtaining and reviewing outside history, ordering and reviewing medications, tests or procedures, care coordination (communications with other health care professionals or caregivers) and documentation in the medical record.

## 2023-11-06 ENCOUNTER — Encounter

## 2023-11-11 ENCOUNTER — Encounter: Payer: Self-pay | Admitting: Hematology

## 2023-11-12 ENCOUNTER — Other Ambulatory Visit (HOSPITAL_COMMUNITY): Payer: Self-pay

## 2023-11-12 ENCOUNTER — Telehealth: Payer: Self-pay

## 2023-11-12 ENCOUNTER — Encounter: Payer: Self-pay | Admitting: Hematology

## 2023-11-12 DIAGNOSIS — C7A1 Malignant poorly differentiated neuroendocrine tumors: Secondary | ICD-10-CM

## 2023-11-12 MED ORDER — CAPECITABINE 500 MG PO TABS: 750.0000 mg/m2 | ORAL_TABLET | Freq: Two times a day (BID) | ORAL | 2 refills | Status: DC

## 2023-11-12 MED ORDER — SULFAMETHOXAZOLE-TRIMETHOPRIM 800-160 MG PO TABS
1.0000 | ORAL_TABLET | ORAL | 2 refills | Status: DC
  Filled 2023-11-12: qty 12, 28d supply, fill #0

## 2023-11-12 MED ORDER — TEMOZOLOMIDE 100 MG PO CAPS
150.0000 mg/m2/d | ORAL_CAPSULE | Freq: Every day | ORAL | 2 refills | Status: DC
Start: 2023-11-12 — End: 2023-11-27

## 2023-11-12 MED ORDER — ONDANSETRON HCL 8 MG PO TABS
8.0000 mg | ORAL_TABLET | Freq: Three times a day (TID) | ORAL | 1 refills | Status: DC | PRN
  Filled 2023-11-12: qty 30, 10d supply, fill #0

## 2023-11-12 MED ORDER — ACYCLOVIR 400 MG PO TABS
400.0000 mg | ORAL_TABLET | Freq: Two times a day (BID) | ORAL | 2 refills | Status: DC
  Filled 2023-11-12: qty 10, 5d supply, fill #0
  Filled 2023-11-12: qty 50, 25d supply, fill #0
  Filled 2023-11-12: qty 60, 30d supply, fill #0

## 2023-11-12 NOTE — Telephone Encounter (Signed)
 Oral Oncology Patient Advocate Encounter   Received notification that prior authorization for temozolomide (TEMODAR) 100 MG capsule  is required.   PA submitted on 11/12/23 [place] [id number] Status is pending     Lucie Lamer, CPhT [place]  [facility] Oncology Pharmacy Patient Advocate Specialist [facility] [place] Phone: [phone]  Fax: [phone] [email or web address]

## 2023-11-12 NOTE — Telephone Encounter (Signed)
 Oral Oncology Patient Advocate Encounter  After completing a benefits investigation, prior authorization for capecitabine (XELODA) 500 MG tablet  is not required at this time through [employer].  Patient must fill at [facility].   Lucie Lamer, CPhT [place]  [facility] Oncology Pharmacy Patient Advocate Specialist II [facility] [place] Phone: [phone]  Fax: [phone] [email or web address]

## 2023-11-12 NOTE — Telephone Encounter (Signed)
 Oral Oncology Pharmacist Encounter  Received new prescription for Xeloda (capecitabine) and Temodar (temozolomide) for the treatment of metastatic neuroendocrine carcinoma in conjunction with supportive care medications including Bactrim, acyclovir and ondansetron , planned duration until disease progression or unacceptable toxicity.  Labs from 11/04/2023 (CBC and CMP) assessed, no interventions needed. Prescription doses and frequency assessed for appropriateness.   Current medication list in Epic reviewed, no significant/ relevant DDIs with Temodar or Xeloda identified.   Evaluated chart and no patient barriers to medication adherence noted.   Patient agreement for treatment documented in MD note on ***.  Prescription has been e-scribed to the [facility] for benefits analysis and approval.  Oral Oncology Clinic will continue to follow for insurance authorization, copayment issues, initial counseling and start date.  Ariyah Sedlack, PharmD Hematology/Oncology Clinical Pharmacist [facility] [phone] 11/12/2023 4:00 PM

## 2023-11-13 ENCOUNTER — Other Ambulatory Visit: Payer: Self-pay

## 2023-11-13 MED ORDER — TEMOZOLOMIDE 100 MG PO CAPS: 150.0000 mg/m2/d | ORAL_CAPSULE | Freq: Every day | ORAL | 2 refills | Status: DC

## 2023-11-13 MED ORDER — CAPECITABINE 500 MG PO TABS: 750.0000 mg/m2 | ORAL_TABLET | Freq: Two times a day (BID) | ORAL | 2 refills | Status: AC

## 2023-11-13 NOTE — Telephone Encounter (Signed)
 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    Izetta is looking into this and will update on next steps.   Lucie Lamer, CPhT [place]  [facility] Pharm[facility] Services Oncology Pharmacy Patient Advocate Specialist II [facility] [place] Phone: [phone]  Fax: [phone] [email or web address]

## 2023-11-13 NOTE — Telephone Encounter (Signed)
 Oncology Pharmacist Encounter  Due to Temodar being denied by [employer], appeal has been written and given to MD to sign.  Love Milbourne, PharmD Hematology/Oncology Clinical Pharmacist [facility] [phone]

## 2023-11-17 ENCOUNTER — Telehealth: Payer: Self-pay

## 2023-11-17 ENCOUNTER — Other Ambulatory Visit (HOSPITAL_COMMUNITY): Payer: Self-pay

## 2023-11-17 ENCOUNTER — Encounter: Payer: Self-pay | Admitting: Hematology

## 2023-11-17 NOTE — Telephone Encounter (Signed)
 Oral Oncology Patient Advocate Encounter   An urgent appeal for the prior authorization denial of  temozolomide (TEMODAR) 100 MG capsule  has been started by the pharmacist on 11/17/23.   This encounter will continue to be updated until final appeal determination.      Lucie Lamer, CPhT [place]  [facility] Pharm[facility] Services Oncology Pharmacy Patient Advocate Specialist [facility] [place] Phone: [phone]  Fax: [phone] [email or web address]

## 2023-11-17 NOTE — Telephone Encounter (Signed)
 Oral Oncology Patient Advocate Encounter  Appeal has been approved for Temozolomide. Ref#: [id number]  Lucie Lamer, CPhT [place]  [facility] Specialty Pharmacy Services Oncology Pharmacy Patient Advocate Specialist [facility] [place] Phone: [phone]  Fax: [phone] [email or web address]

## 2023-11-18 ENCOUNTER — Ambulatory Visit

## 2023-11-18 ENCOUNTER — Ambulatory Visit: Admitting: Hematology

## 2023-11-18 ENCOUNTER — Other Ambulatory Visit

## 2023-11-19 NOTE — Telephone Encounter (Signed)
 Oral Chemotherapy Pharmacist Encounter  Patient Education I spoke with patient for overview of new oral chemotherapy medication: Xeloda (capecitabine) and Temodar (temozolomide) for the treatment of metastatic neuroendocrine carcinoma, planned duration until disease progression or unacceptable.   Treatment goal: Palliative  Counseled patient on administration, dosing, side effects, monitoring, drug-food interactions, safe handling, storage, and disposal.  Patient will take Xeloda (capecitabine) and Temodar (temozolomide) as follows: Xeloda 500mg  tablets, take 4 tablets (2,000 mg total) by mouth (two) times daily after a meal. Take for 14 days (1-14) every cycle. Temodar 100mg  capsules, take 4 capsules (400 mg total) by mouth daily on day 10-14 (5 days) of each 28 day cycle.    Start date: 11/24/2023  Side effects include but not limited to:  Diarrhea: Patient will obtain Imodium  (loperamide ) to have on hand if they experience diarrhea. Patient knows to alert the office of 4 or more loose stools above baseline Hand-and-foot syndrome: Patient knows to apply Voltaren  gel or Udderly smooth cream to hands and feet twice daily.  Nausea or vomiting: Patient has Zofran  on hand for nausea. Take Temodar before bedtime to mitigate nausea. Patient also knows he can take Zofran  30-60 minutes prior to dose if he experiences significant nausea with this medication. Constipation: Patient knows to contact the office if he doesn't have a bowel movement for 3 consecutive days. Decreased blood counts: Patient knows to call the office if he has a fever of >100.4  Reviewed with patient importance of keeping a medication schedule and plan for any missed doses.  After discussion with patient no patient barriers to medication adherence identified.   Distress thermometer flowsheet: Distress thermometer not completed during telephone call as patient has been on previous lines of therapy.    Communication and  Learning Assessment Primary learner: Patient Barriers to learning: No barriers Preferred language: English Learning preferences: Listening Reading  Patient voiced understanding and appreciation. All questions answered. Medication handout provided.  Provided patient with Oral Chemotherapy Navigation Clinic phone number. Patient knows to call the office with questions or concerns.  Dionicia Canavan, PharmD, RPh PGY1 Acute Care Pharmacy Resident [facility] 11/19/2023 12:11 PM

## 2023-11-20 ENCOUNTER — Encounter

## 2023-11-22 ENCOUNTER — Other Ambulatory Visit (HOSPITAL_COMMUNITY): Payer: Self-pay

## 2023-11-23 ENCOUNTER — Other Ambulatory Visit (HOSPITAL_COMMUNITY): Payer: Self-pay

## 2023-11-25 ENCOUNTER — Other Ambulatory Visit (HOSPITAL_COMMUNITY): Payer: Self-pay

## 2023-11-25 ENCOUNTER — Encounter: Payer: Self-pay | Admitting: Hematology

## 2023-11-26 ENCOUNTER — Other Ambulatory Visit (HOSPITAL_COMMUNITY): Payer: Self-pay

## 2023-11-28 ENCOUNTER — Encounter: Payer: Self-pay | Admitting: Hematology

## 2023-11-28 NOTE — Progress Notes (Signed)
 Emergency 2nd $1000 [employer] grant has been set up for patient to use for medications only. Per  Tinnie, he is eligible to reapply fro medication only.  I will follow up with patient next week regarding completing paperwork on my end.  He is good to begin utilizing grant immediately for his [facility] meds.

## 2023-12-03 ENCOUNTER — Other Ambulatory Visit: Payer: Self-pay

## 2023-12-04 ENCOUNTER — Other Ambulatory Visit: Payer: Self-pay

## 2023-12-04 DIAGNOSIS — C7A1 Malignant poorly differentiated neuroendocrine tumors: Secondary | ICD-10-CM

## 2023-12-05 ENCOUNTER — Telehealth: Payer: Self-pay

## 2023-12-05 NOTE — Telephone Encounter (Signed)
 [facility] Clinical Social Work  Clinical Social Work was referred by engineer, civil (consulting) for need for [employer].  Clinical Social Worker attempted to contact patient by phone to offer support and assess for needs.   Phone call went straight to voicemail. CSW left Vm with direct contact.     Follow Up Plan:  CSW will attempt again.    Lizbeth Sprague, LCSW  Clinical Social Worker [facility]

## 2023-12-26 ENCOUNTER — Other Ambulatory Visit: Payer: Self-pay

## 2023-12-26 DIAGNOSIS — C7A1 Malignant poorly differentiated neuroendocrine tumors: Secondary | ICD-10-CM

## 2023-12-29 ENCOUNTER — Inpatient Hospital Stay: Attending: Hematology

## 2023-12-29 ENCOUNTER — Other Ambulatory Visit: Payer: Self-pay

## 2023-12-29 ENCOUNTER — Inpatient Hospital Stay: Admitting: Hematology

## 2023-12-29 ENCOUNTER — Other Ambulatory Visit (HOSPITAL_COMMUNITY): Payer: Self-pay

## 2023-12-29 DIAGNOSIS — C7A1 Malignant poorly differentiated neuroendocrine tumors: Secondary | ICD-10-CM | POA: Diagnosis present

## 2023-12-29 DIAGNOSIS — C7B8 Other secondary neuroendocrine tumors: Secondary | ICD-10-CM | POA: Diagnosis not present

## 2023-12-29 DIAGNOSIS — F17210 Nicotine dependence, cigarettes, uncomplicated: Secondary | ICD-10-CM | POA: Insufficient documentation

## 2023-12-29 DIAGNOSIS — Z79624 Long term (current) use of inhibitors of nucleotide synthesis: Secondary | ICD-10-CM | POA: Diagnosis not present

## 2023-12-29 DIAGNOSIS — Z7189 Other specified counseling: Secondary | ICD-10-CM

## 2023-12-29 LAB — CMP (CANCER CENTER ONLY)
ALT: 36 U/L (ref 0–44)
AST: 45 U/L — ABNORMAL HIGH (ref 15–41)
Albumin: 4.4 g/dL (ref 3.5–5.0)
Alkaline Phosphatase: 119 U/L (ref 38–126)
Anion gap: 9 (ref 5–15)
BUN: 11 mg/dL (ref 6–20)
CO2: 25 mmol/L (ref 22–32)
Calcium: 9.7 mg/dL (ref 8.9–10.3)
Chloride: 106 mmol/L (ref 98–111)
Creatinine: 0.81 mg/dL (ref 0.61–1.24)
GFR, Estimated: >60 mL/min (ref >60)
Glucose, Bld: 95 mg/dL (ref 70–99)
Potassium: 3.9 mmol/L (ref 3.5–5.1)
Sodium: 141 mmol/L (ref 135–145)
Total Bilirubin: 0.6 mg/dL (ref 0.0–1.2)
Total Protein: 7.8 g/dL (ref 6.5–8.1)

## 2023-12-29 LAB — CBC WITH DIFFERENTIAL (CANCER CENTER ONLY)
Abs Immature Granulocytes: 0.01 K/uL (ref 0.00–0.07)
Basophils Absolute: 0 K/uL (ref 0.0–0.1)
Basophils Relative: 0 %
Eosinophils Absolute: 0.3 K/uL (ref 0.0–0.5)
Eosinophils Relative: 5 %
HCT: 36.1 % — ABNORMAL LOW (ref 39.0–52.0)
Hemoglobin: 12.1 g/dL — ABNORMAL LOW (ref 13.0–17.0)
Immature Granulocytes: 0 %
Lymphocytes Relative: 9 %
Lymphs Abs: 0.6 K/uL — ABNORMAL LOW (ref 0.7–4.0)
MCH: 29.6 pg (ref 26.0–34.0)
MCHC: 33.5 g/dL (ref 30.0–36.0)
MCV: 88.3 fL (ref 80.0–100.0)
Monocytes Absolute: 0.6 K/uL (ref 0.1–1.0)
Monocytes Relative: 10 %
Neutro Abs: 4.7 K/uL (ref 1.7–7.7)
Neutrophils Relative %: 76 %
Platelet Count: 147 K/uL — ABNORMAL LOW (ref 150–400)
RBC: 4.09 MIL/uL — ABNORMAL LOW (ref 4.22–5.81)
RDW: 16.2 % — ABNORMAL HIGH (ref 11.5–15.5)
WBC Count: 6.2 K/uL (ref 4.0–10.5)
nRBC: 0 % (ref 0.0–0.2)

## 2023-12-29 MED ORDER — ACYCLOVIR 400 MG PO TABS
400.0000 mg | ORAL_TABLET | Freq: Two times a day (BID) | ORAL | 2 refills | Status: AC
  Filled 2023-12-29: qty 60, 30d supply, fill #0

## 2023-12-29 MED ORDER — ACYCLOVIR 400 MG PO TABS: 400.0000 mg | ORAL_TABLET | Freq: Two times a day (BID) | ORAL | 2 refills | Status: DC

## 2023-12-29 MED ORDER — SULFAMETHOXAZOLE-TRIMETHOPRIM 800-160 MG PO TABS: 1.0000 | ORAL_TABLET | ORAL | 2 refills | Status: DC

## 2023-12-29 MED ORDER — SULFAMETHOXAZOLE-TRIMETHOPRIM 800-160 MG PO TABS
1.0000 | ORAL_TABLET | ORAL | 2 refills | Status: AC
  Filled 2023-12-29: qty 30, 70d supply, fill #0

## 2023-12-29 MED ORDER — TEMOZOLOMIDE 100 MG PO CAPS: 150.0000 mg/m2/d | ORAL_CAPSULE | Freq: Every day | ORAL | 2 refills | Status: AC

## 2023-12-29 MED ORDER — CAPECITABINE 500 MG PO TABS: 1000.0000 mg/m2 | ORAL_TABLET | Freq: Two times a day (BID) | ORAL | 0 refills | Status: DC

## 2023-12-29 NOTE — Progress Notes (Signed)
 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 is a wonderful 33 y.o. 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 He presents today accompanied by his mother. He reports he is doing well.   He reports no color change in stool.   He notes that he is eating well but feels food just sits in stomach.   We discussed getting additional scans and labs for further evaluation and treatment which he was agreeable to. We further discussed getting MRI of brain and PET scan which he was agreeable to.   He notes that he is trying to hold it together mentally and is still trying to process his new diagnosis. We discussed the options of psychological or spiritual counseling which he has not decided on at this time. He was advised that may call back if he decides that he would like to pursue either.   We discussed getting a port a cath installed which he was agreeable to.   He recently had a biliary stent put into common bile duct.    No abdominal pain or change in bowel habits. No new or unexpected weight loss. No other new or acute focal symptoms.   We discussed his recent stomach biopsy done 07/13/2021 which sowed gastropathy and minor chronic gastritis with lymphoid aggregate. Negative for H. Pylori.   We discussed recent cytology done 07/13/2021 which showed presence of malignant cells in pancreas and poorly differentiated/high grade neuroendocrine carcinoma.   We discussed upper endoscopy done  07/13/2021 revealed mass in pancreatic head and some enlarged lymph nodes were seen in the celiac and peripancreatic regions.   Labs done today were reviewed in detail.  SUMMARY OF ONCOLOGIC HISTORY: PRIOR TREATMENT: 08/01/2021-12/26/2021: Received 6 cycles of Carboplatin  and Etoposide , d/c due to progression of disease.  04/24/2022-07/17/2022: Received 6 cycles of FOLFOX, oxaliplatin  discontinued with 6th cycle due to elevated LFTs.  07/31/2022 - 11/04/2023: Received FOLFIRI Oncology History  Malignant poorly differentiated neuroendocrine carcinoma (HCC)  07/23/2021 Initial Diagnosis   Malignant poorly differentiated neuroendocrine carcinoma (HCC)   08/01/2021 - 12/26/2021 Chemotherapy   Patient is on Treatment Plan : NEUROENDOCRINE PANCREATIC, GI Carboplatin  D1 + Etoposide  D1-3 q21d     04/24/2022 - 07/19/2022 Chemotherapy   Patient is on Treatment Plan : PANCREAS FOLFOX q14d     07/31/2022 - 10/23/2023 Chemotherapy   Patient is on Treatment Plan : PANCREAS FOLFIRI q14d     12/24/2022 Cancer Staging   Staging form: Lung, AJCC 8th Edition - Clinical: Stage IVB (pM1c) - Signed by [name], MD on 12/24/2022    CURRENT TREATMENT: Started Xeloda  500 mg + Temodar  400 mg.   INTERVAL HISTORY: Leon Wingert. is a 33 y.o. 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 accompanied by mother   he was last seen by me on 11/04/2023; at the time he did not have any concerns and was doing well. He was unable to receive Folfiri treatment that day due to elevated LFTs.  Today, he says that he is doing well. He expresses interest in going back to  work. Reports that he is tolerating Xeloda  500 mg and Temodar  400 mg well; no nausea, uncontrolled mouth soreness, soreness of hands or feet, or rashes. Denies any drenching night sweats, chest pain, abdominal pain, bowel/urinary changes, or SOB.  Reports  [facility] did not call him to schedule an appointment.   REVIEW OF SYSTEMS:   10 Point review of systems of done and is negative except as noted above.  MEDICAL HISTORY Past Medical History:  Diagnosis Date   Asthma    Neuroendocrine cancer [facility])     SURGICAL HISTORY Past Surgical History:  Procedure Laterality Date   BILIARY DILATION  07/13/2021   Procedure: BILIARY DILATION;  Surgeon: [name], MD;  [name];  Service: Gastroenterology;;   BILIARY STENT PLACEMENT N/A 07/13/2021   Procedure: BILIARY STENT PLACEMENT;  Surgeon: [name], MD;  [facility]: [name];  Service: Gastroenterology;  Laterality: N/A;   BILIARY STENT PLACEMENT N/A 11/05/2021   Procedure: BILIARY STENT PLACEMENT;  Surgeon: [name], MD;  [facility]: [name];  Service: Gastroenterology;  Laterality: N/A;   BIOPSY  07/13/2021   Procedure: BIOPSY;  Surgeon: Wilhelmenia Aloha Raddle., MD;  Location: [name];  Service: Gastroenterology;;   CHOLECYSTECTOMY N/A 11/07/2021   Procedure: LAPAROSCOPIC CHOLECYSTECTOMY;  Surgeon: [name], MD;  Location: [facility];  Service: General;  Laterality: N/A;   ENDOSCOPIC RETROGRADE CHOLANGIOPANCREATOGRAPHY (ERCP) WITH PROPOFOL  N/A 07/13/2021   Procedure: ENDOSCOPIC RETROGRADE CHOLANGIOPANCREATOGRAPHY (ERCP) WITH PROPOFOL ;  Surgeon: [name], MD;  [name];  Service: Gastroenterology;  Laterality: N/A;   ERCP N/A 11/05/2021   Procedure: ENDOSCOPIC RETROGRADE CHOLANGIOPANCREATOGRAPHY (ERCP);  Surgeon: [name], MD;  Location: [name];  Service: Gastroenterology;  Laterality: N/A;   ERCP N/A 06/20/2023   Procedure: ERCP, WITH INTERVENTION IF INDICATED;  Surgeon: Saintclair Jasper, MD;  [name];  Service: Gastroenterology;  Laterality: N/A;   ESOPHAGOGASTRODUODENOSCOPY (EGD) WITH PROPOFOL  N/A 07/13/2021   Procedure: ESOPHAGOGASTRODUODENOSCOPY (EGD) WITH PROPOFOL ;  Surgeon: Wilhelmenia Aloha Raddle., MD;  [name];   Service: Gastroenterology;  Laterality: N/A;   EUS N/A 07/13/2021   Procedure: UPPER ENDOSCOPIC ULTRASOUND (EUS) LINEAR;  Surgeon: Wilhelmenia Aloha Raddle., MD;  [name];  Service: Gastroenterology;  Laterality: N/A;   FINE NEEDLE ASPIRATION  07/13/2021   Procedure: FINE NEEDLE ASPIRATION (FNA) LINEAR;  Surgeon: Wilhelmenia Aloha Raddle., MD;  Location: [name];  Service: Gastroenterology;;   IR IMAGING GUIDED PORT INSERTION  07/31/2021   SPHINCTEROTOMY  07/13/2021   Procedure: ANNETT;  Surgeon: [name], MD;  [name];  Service: Gastroenterology;;    SOCIAL HISTORY Social History   Tobacco Use   Smoking status: Some Days    Types: Cigarettes   Smokeless tobacco: Never  Vaping Use   Vaping status: Never Used  Substance Use Topics   Alcohol use: Yes    Comment: social   Drug use: No    Social History   Social History Narrative   Not on file    SOCIAL DRIVERS OF HEALTH SDOH Screenings   Food Insecurity: No Food Insecurity (06/18/2023)  Housing: Low Risk  (06/18/2023)  Transportation Needs: No Transportation Needs (06/18/2023)  Utilities: Not At Risk (06/18/2023)  Depression (PHQ2-9): Low Risk  (11/04/2023)  Financial Resource Strain: Low Risk  (04/14/2017)  Physical Activity: Inactive (04/14/2017)  Social Connections: Unknown (06/19/2021)   Received from [facility]  Stress: Stress Concern Present (04/14/2017)  Tobacco Use: High Risk (06/20/2023)     FAMILY HISTORY Family History  Problem Relation Age of  Onset   Drug abuse Father      ALLERGIES: is allergic to shellfish allergy.  MEDICATIONS  Current Outpatient Medications  Medication Sig Dispense Refill   acetaminophen  (TYLENOL ) 500 MG tablet Take 1,000 mg by mouth every 6 (six) hours as needed for mild pain.     capecitabine  (XELODA ) 500 MG tablet Take by mouth.     prochlorperazine  (COMPAZINE ) 10 MG tablet Take 1 tablet (10 mg total) by mouth every 6 (six) hours as needed for  nausea or vomiting. 90 tablet 0   temozolomide  (TEMODAR ) 100 MG capsule Take 4 capsules (400 mg total) by mouth daily. Take from Day 10 to Day 14 (5 days) of each 28 day cycle of treatment. May take on an empty stomach or at bedtime to decrease nausea & vomiting. 20 capsule 2   acyclovir  (ZOVIRAX ) 400 MG tablet Take 1 tablet (400 mg total) by mouth 2 (two) times daily. 60 tablet 2   albuterol  (VENTOLIN  HFA) 108 (90 Base) MCG/ACT inhaler Inhale 1-2 puffs into the lungs every 6 (six) hours as needed for wheezing or shortness of breath. (Patient not taking: Reported on 12/29/2023) 18 g 0   methocarbamol  (ROBAXIN ) 500 MG tablet Take 1 tablet (500 mg total) by mouth 2 (two) times daily. (Patient taking differently: Take 500 mg by mouth 2 (two) times daily as needed for muscle spasms.) 20 tablet 0   sulfamethoxazole -trimethoprim  (BACTRIM  DS) 800-160 MG tablet Take 1 tablet by mouth 3 (three) times a week. 30 tablet 2   No current facility-administered medications for this visit.    PHYSICAL EXAMINATION: ECOG PERFORMANCE STATUS: 1 - Symptomatic but completely ambulatory VITALS: Vitals:   12/29/23 1020  BP: (!) 129/93  Pulse: 98  Resp: 20  Temp: 97.9 F (36.6 C)  SpO2: 97%   Filed Weights   12/29/23 1020  Weight: (!) 328 lb (148.8 kg)   Body mass index is 44.48 kg/m.  GENERAL: alert, in no acute distress and comfortable SKIN: no acute rashes, no significant lesions EYES: conjunctiva are pink and non-injected, sclera anicteric OROPHARYNX: MMM, no exudates, no oropharyngeal erythema or ulceration NECK: supple, no JVD LYMPH:  no palpable lymphadenopathy in the cervical, axillary or inguinal regions LUNGS: clear to auscultation b/l with normal respiratory effort HEART: regular rate & rhythm ABDOMEN:  normoactive bowel sounds , non tender, not distended, no hepatosplenomegaly Extremity: no pedal edema PSYCH: alert & oriented x 3 with fluent speech NEURO: no focal motor/sensory  deficits  LABORATORY DATA:   I have reviewed the data as listed     Latest Ref Rng & Units 12/29/2023    9:38 AM 11/04/2023    9:10 AM 10/22/2023    9:47 AM  CBC EXTENDED  WBC 4.0 - 10.5 K/uL 6.2  4.5  3.2   RBC 4.22 - 5.81 MIL/uL 4.09  3.94  4.17   Hemoglobin 13.0 - 17.0 g/dL 87.8  88.8  88.2   HCT 39.0 - 52.0 % 36.1  33.7  35.9   Platelets 150 - 400 K/uL 147  143  144   NEUT# 1.7 - 7.7 K/uL 4.7  3.5  2.0   Lymph# 0.7 - 4.0 K/uL 0.6  0.5  0.6        Latest Ref Rng & Units 12/29/2023    9:38 AM 11/04/2023    9:10 AM 10/22/2023    9:47 AM  CMP  Glucose 70 - 99 mg/dL 95  838  854   BUN 6 - 20 mg/dL  11  13  11    Creatinine 0.61 - 1.24 mg/dL 9.18  9.11  9.18   Sodium 135 - 145 mmol/L 141  138  140   Potassium 3.5 - 5.1 mmol/L 3.9  3.6  3.9   Chloride 98 - 111 mmol/L 106  106  107   CO2 22 - 32 mmol/L 25  26  27    Calcium  8.9 - 10.3 mg/dL 9.7  9.0  9.2   Total Protein 6.5 - 8.1 g/dL 7.8  7.3  7.6   Total Bilirubin 0.0 - 1.2 mg/dL 0.6  1.0  0.8   Alkaline Phos 38 - 126 U/L 119  123  100   AST 15 - 41 U/L 45  86  17   ALT 0 - 44 U/L 36  101  17         RADIOGRAPHIC STUDIES: I have personally reviewed the radiological images as listed and agreed with the findings in the report. No results found.   ASSESSMENT & PLAN:  Leon Biehn. Is a 33 y.o. male who presents for a follow up for high grade neuroendocrine carcinoma.    1. 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 -Cytology done 07/13/2021 shows presence of malignant cells in pancreas and poorly differentiated/high grade neuroendocrine carcinoma. [employer] did not show any targetable mutations. -Received 6 cycles of first line chemotherapy with carboplatin  and etoposide  from 08/01/2021-12/26/2021.Treatment was discontinued after CT CAP from 04/04/2022 showed progression of disease. -Received 6 cycles of second line chemotherapy with FOLFOX from  04/24/2022-07/17/2022. Oxaliplatin  discontinued with 6th cycle due to elevated LFTs. -Started third line chemotherapy with FOLFIRI on 07/31/2022 till 10/2023 -Most recent CT CAP from 06/18/2023 showed overall stable disease.  -  CT CAPscan on 09/26/2023, showed progression with primary mass in pancreas increased in size 4.8 cm > 5.2 cm, liver lesions increased by ~0.25 - 0.5 inches, abdominal lymph nodes stable, no new areas  PLAN: - Discussed lab results on 12/29/2023 in detail with patient: CBC showed WBC of 6.2K, Hemoglobin of 12.1 increased from 11.1, and PLTs of 147K increased from 143K. CMP with AST 45 decreased from 86 and ALT 36 decreased from 101   - Repeat imaging after a couple more cycles  - Tolerating Xeloda  and Temodar  without acute toxicities; continue as prescribed.  Start Bactrim  (had not picked it up yyet)  Continue Acyclovir   FOLLOW-UP in 3 weeks for labs and follow-up with Dr. Onesimo.  The total time spent in the appointment was 30 minutes* .  All of the patient's questions were answered and the patient knows to call the clinic with any problems, questions, or concerns.  Emaline Onesimo MD MS AAHIVMS [facility] Hematology/Oncology Physician [facility]  *Total Encounter Time as defined by the Centers for Medicare and Medicaid Services includes, in addition to the face-to-face time of a patient visit (documented in the note above) non-face-to-face time: obtaining and reviewing outside history, ordering and reviewing medications, tests or procedures, care coordination (communications with other health care professionals or caregivers) and documentation in the medical record.  I,Emily Lagle,acting as a neurosurgeon for Emaline Onesimo, MD.,have documented all relevant documentation on the behalf of Emaline Onesimo, MD,as directed by  Emaline Onesimo, MD while in the presence of Emaline Onesimo, MD.  I have reviewed the above documentation for accuracy and completeness, and I agree with the  above.  Leon Leggio, MD

## 2024-01-05 ENCOUNTER — Encounter: Payer: Self-pay | Admitting: Hematology

## 2024-01-16 ENCOUNTER — Encounter: Payer: Self-pay | Admitting: Hematology

## 2024-01-22 ENCOUNTER — Other Ambulatory Visit: Payer: Self-pay

## 2024-01-22 DIAGNOSIS — C7A1 Malignant poorly differentiated neuroendocrine tumors: Secondary | ICD-10-CM

## 2024-01-23 ENCOUNTER — Inpatient Hospital Stay

## 2024-01-23 ENCOUNTER — Inpatient Hospital Stay: Attending: Hematology | Admitting: Hematology

## 2024-01-23 VITALS — BP 134/84 | HR 89 | Temp 97.6°F | Resp 17 | Ht 72.0 in | Wt 342.0 lb

## 2024-01-23 DIAGNOSIS — D649 Anemia, unspecified: Secondary | ICD-10-CM | POA: Diagnosis not present

## 2024-01-23 DIAGNOSIS — C7B8 Other secondary neuroendocrine tumors: Secondary | ICD-10-CM | POA: Diagnosis not present

## 2024-01-23 DIAGNOSIS — C7A1 Malignant poorly differentiated neuroendocrine tumors: Secondary | ICD-10-CM

## 2024-01-23 DIAGNOSIS — F17210 Nicotine dependence, cigarettes, uncomplicated: Secondary | ICD-10-CM | POA: Diagnosis not present

## 2024-01-23 LAB — CBC WITH DIFFERENTIAL (CANCER CENTER ONLY)
Abs Immature Granulocytes: 0.01 K/uL (ref 0.00–0.07)
Basophils Absolute: 0 K/uL (ref 0.0–0.1)
Basophils Relative: 0 %
Eosinophils Absolute: 0.4 K/uL (ref 0.0–0.5)
Eosinophils Relative: 6 %
HCT: 33.6 % — ABNORMAL LOW (ref 39.0–52.0)
Hemoglobin: 11.4 g/dL — ABNORMAL LOW (ref 13.0–17.0)
Immature Granulocytes: 0 %
Lymphocytes Relative: 10 %
Lymphs Abs: 0.6 K/uL — ABNORMAL LOW (ref 0.7–4.0)
MCH: 30.1 pg (ref 26.0–34.0)
MCHC: 33.9 g/dL (ref 30.0–36.0)
MCV: 88.7 fL (ref 80.0–100.0)
Monocytes Absolute: 0.7 K/uL (ref 0.1–1.0)
Monocytes Relative: 11 %
Neutro Abs: 4.6 K/uL (ref 1.7–7.7)
Neutrophils Relative %: 73 %
Platelet Count: 132 K/uL — ABNORMAL LOW (ref 150–400)
RBC: 3.79 MIL/uL — ABNORMAL LOW (ref 4.22–5.81)
RDW: 16.6 % — ABNORMAL HIGH (ref 11.5–15.5)
WBC Count: 6.3 K/uL (ref 4.0–10.5)
nRBC: 0 % (ref 0.0–0.2)

## 2024-01-23 LAB — CMP (CANCER CENTER ONLY)
ALT: 24 U/L (ref 0–44)
AST: 38 U/L (ref 15–41)
Albumin: 4.4 g/dL (ref 3.5–5.0)
Alkaline Phosphatase: 100 U/L (ref 38–126)
Anion gap: 10 (ref 5–15)
BUN: 13 mg/dL (ref 6–20)
CO2: 24 mmol/L (ref 22–32)
Calcium: 9 mg/dL (ref 8.9–10.3)
Chloride: 106 mmol/L (ref 98–111)
Creatinine: 0.86 mg/dL (ref 0.61–1.24)
GFR, Estimated: >60 mL/min
Glucose, Bld: 102 mg/dL — ABNORMAL HIGH (ref 70–99)
Potassium: 3.8 mmol/L (ref 3.5–5.1)
Sodium: 140 mmol/L (ref 135–145)
Total Bilirubin: 1 mg/dL (ref 0.0–1.2)
Total Protein: 7.3 g/dL (ref 6.5–8.1)

## 2024-01-23 NOTE — Progress Notes (Signed)
 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  HISTORY OF PRESENTING ILLNESS: (07/23/2021) Leon Ozell Leonce Mickey. is a wonderful 33 y.o. 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 He presents today accompanied by his mother. He reports he is doing well.   He reports no color change in stool.   He notes that he is eating well but feels food just sits in stomach.   We discussed getting additional scans and labs for further evaluation and treatment which he was agreeable to. We further discussed getting MRI of brain and PET scan which he was agreeable to.   He notes that he is trying to hold it together mentally and is still trying to process his new diagnosis. We discussed the options of psychological or spiritual counseling which he has not decided on at this time. He was advised that may call back if he decides that he would like to pursue either.   We discussed getting a port a cath installed which he was agreeable to.   He recently had a biliary stent put into common bile duct.    No abdominal pain or change in bowel habits. No new or unexpected weight loss. No other new or acute focal symptoms.   We discussed his recent stomach biopsy done 07/13/2021 which sowed gastropathy and minor chronic gastritis with lymphoid aggregate. Negative for H. Pylori.   We discussed recent cytology done 07/13/2021 which showed presence of malignant cells in pancreas and poorly differentiated/high grade neuroendocrine carcinoma.   We discussed upper endoscopy done  07/13/2021 revealed mass in pancreatic head and some enlarged lymph nodes were seen in the celiac and peripancreatic regions.   Labs done today were reviewed in detail.   SUMMARY OF ONCOLOGIC HISTORY: Oncology History  Malignant poorly differentiated neuroendocrine carcinoma (HCC)  07/23/2021 Initial Diagnosis   Malignant poorly differentiated neuroendocrine carcinoma (HCC)   08/01/2021 - 12/26/2021 Chemotherapy   Patient is on Treatment Plan : NEUROENDOCRINE PANCREATIC, GI Carboplatin  D1 + Etoposide  D1-3 q21d     04/24/2022 - 07/19/2022 Chemotherapy   Patient is on Treatment Plan : PANCREAS FOLFOX q14d     07/31/2022 - 10/23/2023 Chemotherapy   Patient is on Treatment Plan : PANCREAS FOLFIRI q14d     12/24/2022 Cancer Staging   Staging form: Lung, AJCC 8th Edition - Clinical: Stage IVB (pM1c) - Signed by [name], MD on 12/24/2022     INTERVAL HISTORY: Leon Leighty. is a 33 y.o. male who is here today for continued evaluation and management of poorly differentiated/high-grade metastatic neuroendocrine carinoma.   he was last seen by me on 12/29/2023; at the time he did not have any concerns and was doing well.   Today, he is on his second cycle of chemotherapy treatment and is beginning his third cycle on Monday 01/26/24. He reports he tolerated his second cycle well with no toxicities.   He denies unexpected weight change, chest pain, abdominal pain, new bone pains, or new lumps/bumps.  No infection issues. No other reported notable toxicities from current regimen.  REVIEW OF SYSTEMS:   10 Point review of systems of done and is negative except as noted  above.  MEDICAL HISTORY Past Medical History:  Diagnosis Date   Asthma    Neuroendocrine cancer (HCC)     SURGICAL HISTORY Past Surgical History:  Procedure Laterality Date   BILIARY DILATION  07/13/2021   Procedure: BILIARY DILATION;  Surgeon: [name], MD;  Location: [name];  Service:  Gastroenterology;;   BILIARY STENT PLACEMENT N/A 07/13/2021   Procedure: BILIARY STENT PLACEMENT;  Surgeon: [name], MD;  Location: [name];  Service: Gastroenterology;  Laterality: N/A;   BILIARY STENT PLACEMENT N/A 11/05/2021   Procedure: BILIARY STENT PLACEMENT;  Surgeon: [name], MD;  Location: [name];  Service: Gastroenterology;  Laterality: N/A;   BIOPSY  07/13/2021   Procedure: BIOPSY;  Surgeon: Wilhelmenia Aloha Raddle., MD;  Location: [name];  Service: Gastroenterology;;   CHOLECYSTECTOMY N/A 11/07/2021   Procedure: LAPAROSCOPIC CHOLECYSTECTOMY;  Surgeon: [name], MD;  Location: [facility];  Service: General;  Laterality: N/A;   ENDOSCOPIC RETROGRADE CHOLANGIOPANCREATOGRAPHY (ERCP) WITH PROPOFOL  N/A 07/13/2021   Procedure: ENDOSCOPIC RETROGRADE CHOLANGIOPANCREATOGRAPHY (ERCP) WITH PROPOFOL ;  Surgeon: [name], MD;  Location: [name];  Service: Gastroenterology;  Laterality: N/A;   ERCP N/A 11/05/2021   Procedure: ENDOSCOPIC RETROGRADE CHOLANGIOPANCREATOGRAPHY (ERCP);  Surgeon: [name], MD;  Location: [name];  Service: Gastroenterology;  Laterality: N/A;   ERCP N/A 06/20/2023   Procedure: ERCP, WITH INTERVENTION IF INDICATED;  Surgeon: Saintclair Jasper, MD;  Location: [name];  Service: Gastroenterology;  Laterality: N/A;   ESOPHAGOGASTRODUODENOSCOPY (EGD) WITH PROPOFOL  N/A 07/13/2021   Procedure: ESOPHAGOGASTRODUODENOSCOPY (EGD) WITH PROPOFOL ;  Surgeon: Wilhelmenia Aloha Raddle., MD;  Location: [name];  Service: Gastroenterology;  Laterality: N/A;   EUS N/A 07/13/2021   Procedure: UPPER ENDOSCOPIC ULTRASOUND (EUS) LINEAR;  Surgeon: Wilhelmenia Aloha Raddle., MD;  Location: [name];  Service: Gastroenterology;  Laterality: N/A;   FINE NEEDLE ASPIRATION  07/13/2021   Procedure: FINE NEEDLE ASPIRATION (FNA) LINEAR;  Surgeon: Wilhelmenia Aloha Raddle., MD;  Location: [name];  Service: Gastroenterology;;   IR IMAGING GUIDED PORT INSERTION  07/31/2021    SPHINCTEROTOMY  07/13/2021   Procedure: ANNETT;  Surgeon: [name], MD;  Location: [name];  Service: Gastroenterology;;    SOCIAL HISTORY Social History[1]  Social History   Social History Narrative   Not on file    SOCIAL DRIVERS OF HEALTH SDOH Screenings   Food Insecurity: No Food Insecurity (06/18/2023)  Housing: Low Risk (06/18/2023)  Transportation Needs: No Transportation Needs (06/18/2023)  Utilities: Not At Risk (06/18/2023)  Depression (PHQ2-9): Low Risk (11/04/2023)  Social Connections: Unknown (06/19/2021)   Received from [facility]  Tobacco Use: High Risk (06/20/2023)     FAMILY HISTORY Family History  Problem Relation Age of Onset   Drug abuse Father      ALLERGIES: is allergic to shellfish allergy.  MEDICATIONS  Current Outpatient Medications  Medication Sig Dispense Refill   acetaminophen  (TYLENOL ) 500 MG tablet Take 1,000 mg by mouth every 6 (six) hours as needed for mild pain.     acyclovir  (ZOVIRAX ) 400 MG tablet Take 1 tablet (400 mg total) by mouth 2 (two) times daily. 60 tablet 2   albuterol  (VENTOLIN  HFA) 108 (90 Base) MCG/ACT inhaler Inhale 1-2 puffs into the lungs every 6 (six) hours as needed for wheezing or shortness of breath. (Patient not taking: Reported on 12/29/2023) 18 g 0   methocarbamol  (ROBAXIN ) 500 MG tablet Take 1 tablet (500 mg total) by mouth 2 (two) times daily. (Patient taking differently: Take 500 mg by mouth 2 (two) times daily  as needed for muscle spasms.) 20 tablet 0   prochlorperazine  (COMPAZINE ) 10 MG tablet Take 1 tablet (10 mg total) by mouth every 6 (six) hours as needed for nausea or vomiting. 90 tablet 0   sulfamethoxazole -trimethoprim  (BACTRIM  DS) 800-160 MG tablet Take 1 tablet by mouth 3 (three) times a week. 30 tablet 2   temozolomide  (TEMODAR ) 100 MG capsule Take 4 capsules (400 mg total) by mouth daily. Take from Day 10 to Day 14 (5 days) of each 28 day cycle of treatment. May take on an empty stomach  or at bedtime to decrease nausea & vomiting. 20 capsule 2   No current facility-administered medications for this visit.    PHYSICAL EXAMINATION: ECOG PERFORMANCE STATUS: 1 - Symptomatic but completely ambulatory VITALS: Vitals:   01/23/24 1132  BP: 134/84  Pulse: 89  Resp: 17  Temp: 97.6 F (36.4 C)  SpO2: 96%   Filed Weights   01/23/24 1132  Weight: (!) 342 lb (155.1 kg)   Body mass index is 46.38 kg/m.  GENERAL: alert, in no acute distress and comfortable SKIN: no acute rashes, no significant lesions EYES: conjunctiva are pink and non-injected, sclera anicteric OROPHARYNX: MMM, no exudates, no oropharyngeal erythema or ulceration NECK: supple, no JVD LYMPH:  no palpable lymphadenopathy in the cervical, axillary or inguinal regions LUNGS: clear to auscultation b/l with normal respiratory effort HEART: regular rate & rhythm ABDOMEN:  normoactive bowel sounds , non tender, not distended, no hepatosplenomegaly Extremity: no pedal edema PSYCH: alert & oriented x 3 with fluent speech NEURO: no focal motor/sensory deficits  LABORATORY DATA:   I have reviewed the data as listed     Latest Ref Rng & Units 01/23/2024   10:51 AM 12/29/2023    9:38 AM 11/04/2023    9:10 AM  CBC EXTENDED  WBC 4.0 - 10.5 K/uL 6.3  6.2  4.5   RBC 4.22 - 5.81 MIL/uL 3.79  4.09  3.94   Hemoglobin 13.0 - 17.0 g/dL 88.5  87.8  88.8   HCT 39.0 - 52.0 % 33.6  36.1  33.7   Platelets 150 - 400 K/uL 132  147  143   NEUT# 1.7 - 7.7 K/uL 4.6  4.7  3.5   Lymph# 0.7 - 4.0 K/uL 0.6  0.6  0.5        Latest Ref Rng & Units 01/23/2024   10:51 AM 12/29/2023    9:38 AM 11/04/2023    9:10 AM  CMP  Glucose 70 - 99 mg/dL 897  95  838   BUN 6 - 20 mg/dL 13  11  13    Creatinine 0.61 - 1.24 mg/dL 9.13  9.18  9.11   Sodium 135 - 145 mmol/L 140  141  138   Potassium 3.5 - 5.1 mmol/L 3.8  3.9  3.6   Chloride 98 - 111 mmol/L 106  106  106   CO2 22 - 32 mmol/L 24  25  26    Calcium  8.9 - 10.3 mg/dL 9.0  9.7   9.0   Total Protein 6.5 - 8.1 g/dL 7.3  7.8  7.3   Total Bilirubin 0.0 - 1.2 mg/dL 1.0  0.6  1.0   Alkaline Phos 38 - 126 U/L 100  119  123   AST 15 - 41 U/L 38  45  86   ALT 0 - 44 U/L 24  36  101           RADIOGRAPHIC STUDIES: I have personally reviewed  the radiological images as listed and agreed with the findings in the report. No results found.  ASSESSMENT & PLAN:  33 y.o. male with   1. 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 -Cytology done 07/13/2021 shows presence of malignant cells in pancreas and poorly differentiated/high grade neuroendocrine carcinoma. [employer] did not show any targetable mutations. -Received 6 cycles of first line chemotherapy with carboplatin  and etoposide  from 08/01/2021-12/26/2021.Treatment was discontinued after CT CAP from 04/04/2022 showed progression of disease. -Received 6 cycles of second line chemotherapy with FOLFOX from 04/24/2022-07/17/2022. Oxaliplatin  discontinued with 6th cycle due to elevated LFTs. -Started third line chemotherapy with FOLFIRI on 07/31/2022 till 10/2023 -Most recent CT CAP from 06/18/2023 showed overall stable disease.  -  CT CAPscan on 09/26/2023, showed progression with primary mass in pancreas increased in size 4.8 cm > 5.2 cm, liver lesions increased by ~0.25 - 0.5 inches, abdominal lymph nodes stable, no new areas  PLAN: - Discussed lab results on 01/23/2024 in detail with patient: -hemoglobin is at 11.4, some mild anemia present -WBC 6.3k and PLTs are near normal at 143k -CMP looks stable, abnormal liver functions have normalized -no notable toxicitiy from current Xeloda  temodar  regimen -we will recalculate the doses for the third treatment cycle according to weight gain -follow up with Beth to see if [facility] has called regarding clinical trial -continue with acyclovir  and bactrim  for prophylaxis -will plan to repeat CT scans before the fourth treatment  cycle -RTC in 3 weeks for treatment with labs in 4 weeks  FOLLOW-UP  CT CAP in 3 weeks RTC with Dr Onesimo with labs in 4 weeks   The total time spent in the appointment was 30 minutes* .  All of the patient's questions were answered and the patient knows to call the clinic with any problems, questions, or concerns.  Emaline Onesimo MD MS AAHIVMS [facility] Hematology/Oncology Physician [facility]  *Total Encounter Time as defined by the Centers for Medicare and Medicaid Services includes, in addition to the face-to-face time of a patient visit (documented in the note above) non-face-to-face time: obtaining and reviewing outside history, ordering and reviewing medications, tests or procedures, care coordination (communications with other health care professionals or caregivers) and documentation in the medical record.  I, Alan Blowers, acting as a neurosurgeon for Emaline Onesimo, MD.,have documented all relevant documentation on the behalf of Emaline Onesimo, MD,as directed by  Emaline Onesimo, MD while in the presence of Emaline Onesimo, MD.  I have reviewed the above documentation for accuracy and completeness, and I agree with the above.  Emaline Onesimo, MD     [1]  Social History Tobacco Use   Smoking status: Some Days    Types: Cigarettes   Smokeless tobacco: Never  Vaping Use   Vaping status: Never Used  Substance Use Topics   Alcohol use: Yes    Comment: social   Drug use: No   "

## 2024-01-26 ENCOUNTER — Telehealth: Payer: Self-pay | Admitting: Hematology

## 2024-01-26 NOTE — Telephone Encounter (Signed)
 Scheduled patient for next appointment. Called and spoke with the patient, he is aware.

## 2024-02-03 ENCOUNTER — Encounter: Payer: Self-pay | Admitting: Hematology

## 2024-02-06 ENCOUNTER — Encounter: Payer: Self-pay | Admitting: Hematology

## 2024-02-07 ENCOUNTER — Other Ambulatory Visit: Payer: Self-pay | Admitting: Hematology

## 2024-02-09 ENCOUNTER — Encounter: Payer: Self-pay | Admitting: Hematology

## 2024-02-10 ENCOUNTER — Encounter: Payer: Self-pay | Admitting: Hematology

## 2024-02-13 ENCOUNTER — Encounter: Payer: Self-pay | Admitting: Hematology

## 2024-02-13 ENCOUNTER — Ambulatory Visit (HOSPITAL_COMMUNITY)
Admission: RE | Admit: 2024-02-13 | Discharge: 2024-02-13 | Disposition: A | Source: Ambulatory Visit | Attending: Hematology

## 2024-02-13 DIAGNOSIS — C7A1 Malignant poorly differentiated neuroendocrine tumors: Secondary | ICD-10-CM | POA: Insufficient documentation

## 2024-02-13 MED ORDER — IOHEXOL 300 MG/ML  SOLN
100.0000 mL | Freq: Once | INTRAMUSCULAR | Status: AC | PRN
  Administered 2024-02-13: 100 mL via INTRAVENOUS

## 2024-03-05 ENCOUNTER — Other Ambulatory Visit: Payer: Self-pay

## 2024-03-05 DIAGNOSIS — C7A1 Malignant poorly differentiated neuroendocrine tumors: Secondary | ICD-10-CM

## 2024-03-09 ENCOUNTER — Inpatient Hospital Stay

## 2024-03-09 ENCOUNTER — Other Ambulatory Visit: Payer: Self-pay | Admitting: Hematology

## 2024-03-09 ENCOUNTER — Inpatient Hospital Stay: Admitting: Hematology

## 2024-03-09 ENCOUNTER — Inpatient Hospital Stay: Attending: Hematology

## 2024-03-09 DIAGNOSIS — C7A1 Malignant poorly differentiated neuroendocrine tumors: Secondary | ICD-10-CM

## 2024-03-09 LAB — CBC WITH DIFFERENTIAL (CANCER CENTER ONLY)
Abs Immature Granulocytes: 0.01 10*3/uL (ref 0.00–0.07)
Basophils Absolute: 0 10*3/uL (ref 0.0–0.1)
Basophils Relative: 0 %
Eosinophils Absolute: 0.4 10*3/uL (ref 0.0–0.5)
Eosinophils Relative: 6 %
HCT: 36.6 % — ABNORMAL LOW (ref 39.0–52.0)
Hemoglobin: 12.6 g/dL — ABNORMAL LOW (ref 13.0–17.0)
Immature Granulocytes: 0 %
Lymphocytes Relative: 12 %
Lymphs Abs: 0.8 10*3/uL (ref 0.7–4.0)
MCH: 30.6 pg (ref 26.0–34.0)
MCHC: 34.4 g/dL (ref 30.0–36.0)
MCV: 88.8 fL (ref 80.0–100.0)
Monocytes Absolute: 0.7 10*3/uL (ref 0.1–1.0)
Monocytes Relative: 10 %
Neutro Abs: 5 10*3/uL (ref 1.7–7.7)
Neutrophils Relative %: 72 %
Platelet Count: 150 10*3/uL (ref 150–400)
RBC: 4.12 MIL/uL — ABNORMAL LOW (ref 4.22–5.81)
RDW: 16.3 % — ABNORMAL HIGH (ref 11.5–15.5)
WBC Count: 6.9 10*3/uL (ref 4.0–10.5)
nRBC: 0 % (ref 0.0–0.2)

## 2024-03-09 LAB — CMP (CANCER CENTER ONLY)
ALT: 23 U/L (ref 0–44)
AST: 36 U/L (ref 15–41)
Albumin: 4.5 g/dL (ref 3.5–5.0)
Alkaline Phosphatase: 103 U/L (ref 38–126)
Anion gap: 10 (ref 5–15)
BUN: 14 mg/dL (ref 6–20)
CO2: 26 mmol/L (ref 22–32)
Calcium: 9.5 mg/dL (ref 8.9–10.3)
Chloride: 102 mmol/L (ref 98–111)
Creatinine: 0.93 mg/dL (ref 0.61–1.24)
GFR, Estimated: >60 mL/min
Glucose, Bld: 132 mg/dL — ABNORMAL HIGH (ref 70–99)
Potassium: 3.8 mmol/L (ref 3.5–5.1)
Sodium: 138 mmol/L (ref 135–145)
Total Bilirubin: 0.8 mg/dL (ref 0.0–1.2)
Total Protein: 7.7 g/dL (ref 6.5–8.1)
